# Patient Record
Sex: Male | Born: 1955 | Race: White | Hispanic: No | Marital: Married | Smoking: Current every day smoker
Health system: Southern US, Community
[De-identification: ages and names within clinical notes are randomized; demographics above are authoritative.]

## PROBLEM LIST (undated history)

## (undated) DIAGNOSIS — C689 Malignant neoplasm of urinary organ, unspecified: Secondary | ICD-10-CM

## (undated) DIAGNOSIS — D500 Iron deficiency anemia secondary to blood loss (chronic): Secondary | ICD-10-CM

## (undated) DIAGNOSIS — L409 Psoriasis, unspecified: Secondary | ICD-10-CM

## (undated) DIAGNOSIS — C679 Malignant neoplasm of bladder, unspecified: Secondary | ICD-10-CM

## (undated) DIAGNOSIS — D509 Iron deficiency anemia, unspecified: Secondary | ICD-10-CM

## (undated) DIAGNOSIS — D6959 Other secondary thrombocytopenia: Principal | ICD-10-CM

## (undated) DIAGNOSIS — G629 Polyneuropathy, unspecified: Secondary | ICD-10-CM

## (undated) DIAGNOSIS — E538 Deficiency of other specified B group vitamins: Secondary | ICD-10-CM

## (undated) DIAGNOSIS — I10 Essential (primary) hypertension: Secondary | ICD-10-CM

## (undated) DIAGNOSIS — M545 Low back pain, unspecified: Secondary | ICD-10-CM

## (undated) DIAGNOSIS — Z9889 Other specified postprocedural states: Secondary | ICD-10-CM

## (undated) DIAGNOSIS — K2211 Ulcer of esophagus with bleeding: Secondary | ICD-10-CM

## (undated) DIAGNOSIS — F329 Major depressive disorder, single episode, unspecified: Secondary | ICD-10-CM

## (undated) DIAGNOSIS — F1011 Alcohol abuse, in remission: Secondary | ICD-10-CM

## (undated) DIAGNOSIS — K439 Ventral hernia without obstruction or gangrene: Secondary | ICD-10-CM

## (undated) DIAGNOSIS — E785 Hyperlipidemia, unspecified: Secondary | ICD-10-CM

## (undated) DIAGNOSIS — N183 Chronic kidney disease, stage 3 (moderate): Secondary | ICD-10-CM

## (undated) DIAGNOSIS — G4730 Sleep apnea, unspecified: Secondary | ICD-10-CM

## (undated) DIAGNOSIS — A498 Other bacterial infections of unspecified site: Secondary | ICD-10-CM

## (undated) DIAGNOSIS — I8501 Esophageal varices with bleeding: Secondary | ICD-10-CM

## (undated) DIAGNOSIS — L0390 Cellulitis, unspecified: Secondary | ICD-10-CM

## (undated) DIAGNOSIS — K219 Gastro-esophageal reflux disease without esophagitis: Secondary | ICD-10-CM

## (undated) DIAGNOSIS — I5032 Chronic diastolic (congestive) heart failure: Secondary | ICD-10-CM

## (undated) DIAGNOSIS — A048 Other specified bacterial intestinal infections: Secondary | ICD-10-CM

## (undated) DIAGNOSIS — D649 Anemia, unspecified: Secondary | ICD-10-CM

## (undated) DIAGNOSIS — C801 Malignant (primary) neoplasm, unspecified: Secondary | ICD-10-CM

## (undated) DIAGNOSIS — K7460 Unspecified cirrhosis of liver: Secondary | ICD-10-CM

## (undated) DIAGNOSIS — F32A Depression, unspecified: Secondary | ICD-10-CM

## (undated) DIAGNOSIS — K56609 Unspecified intestinal obstruction, unspecified as to partial versus complete obstruction: Secondary | ICD-10-CM

## (undated) DIAGNOSIS — A4902 Methicillin resistant Staphylococcus aureus infection, unspecified site: Secondary | ICD-10-CM

## (undated) DIAGNOSIS — E119 Type 2 diabetes mellitus without complications: Secondary | ICD-10-CM

## (undated) DIAGNOSIS — G2581 Restless legs syndrome: Secondary | ICD-10-CM

## (undated) DIAGNOSIS — E1110 Type 2 diabetes mellitus with ketoacidosis without coma: Secondary | ICD-10-CM

## (undated) HISTORY — DX: Low back pain, unspecified: M54.50

## (undated) HISTORY — DX: Esophageal varices with bleeding: I85.01

## (undated) HISTORY — DX: Gastro-esophageal reflux disease without esophagitis: K21.9

## (undated) HISTORY — DX: Iron deficiency anemia secondary to blood loss (chronic): D50.0

## (undated) HISTORY — DX: Iron deficiency anemia, unspecified: D50.9

## (undated) HISTORY — DX: Cellulitis, unspecified: L03.90

## (undated) HISTORY — DX: Ventral hernia without obstruction or gangrene: K43.9

## (undated) HISTORY — PX: CATARACT EXTRACTION, BILATERAL: SHX1313

## (undated) HISTORY — PX: BLADDER SURGERY: SHX569

## (undated) HISTORY — PX: OTHER SURGICAL HISTORY: SHX169

## (undated) HISTORY — DX: Essential (primary) hypertension: I10

## (undated) HISTORY — DX: Other secondary thrombocytopenia: D69.59

## (undated) HISTORY — DX: Unspecified cirrhosis of liver: K74.60

## (undated) HISTORY — DX: Restless legs syndrome: G25.81

## (undated) HISTORY — DX: Type 2 diabetes mellitus without complications: E11.9

## (undated) HISTORY — DX: Hyperlipidemia, unspecified: E78.5

## (undated) HISTORY — DX: Malignant neoplasm of urinary organ, unspecified: C68.9

## (undated) HISTORY — DX: Sleep apnea, unspecified: G47.30

## (undated) HISTORY — DX: Anemia, unspecified: D64.9

## (undated) HISTORY — DX: Unspecified intestinal obstruction, unspecified as to partial versus complete obstruction: K56.609

## (undated) HISTORY — PX: EYE SURGERY: SHX253

## (undated) HISTORY — DX: Other bacterial infections of unspecified site: A49.8

## (undated) HISTORY — PX: HERNIA REPAIR: SHX51

## (undated) HISTORY — DX: Depression, unspecified: F32.A

## (undated) HISTORY — DX: Deficiency of other specified B group vitamins: E53.8

## (undated) HISTORY — DX: Alcohol abuse, in remission: F10.11

## (undated) HISTORY — DX: Psoriasis, unspecified: L40.9

## (undated) HISTORY — DX: Polyneuropathy, unspecified: G62.9

## (undated) HISTORY — DX: Other specified postprocedural states: Z98.890

## (undated) HISTORY — DX: Malignant neoplasm of bladder, unspecified: C67.9

## (undated) HISTORY — DX: Major depressive disorder, single episode, unspecified: F32.9

## (undated) HISTORY — PX: SHOULDER SURGERY: SHX246

## (undated) HISTORY — DX: Low back pain: M54.5

---

## 2001-09-01 ENCOUNTER — Ambulatory Visit (HOSPITAL_COMMUNITY): Admission: RE | Admit: 2001-09-01 | Discharge: 2001-09-01 | Payer: Self-pay | Admitting: Family Medicine

## 2001-09-01 ENCOUNTER — Encounter: Payer: Self-pay | Admitting: Family Medicine

## 2001-09-16 ENCOUNTER — Encounter: Payer: Self-pay | Admitting: Orthopaedic Surgery

## 2001-09-16 ENCOUNTER — Ambulatory Visit (HOSPITAL_COMMUNITY): Admission: RE | Admit: 2001-09-16 | Discharge: 2001-09-16 | Payer: Self-pay | Admitting: Orthopaedic Surgery

## 2001-09-27 ENCOUNTER — Encounter (HOSPITAL_COMMUNITY): Admission: RE | Admit: 2001-09-27 | Discharge: 2001-10-27 | Payer: Self-pay | Admitting: Orthopaedic Surgery

## 2002-11-12 ENCOUNTER — Emergency Department (HOSPITAL_COMMUNITY): Admission: EM | Admit: 2002-11-12 | Discharge: 2002-11-12 | Payer: Self-pay | Admitting: Emergency Medicine

## 2002-11-12 ENCOUNTER — Encounter: Payer: Self-pay | Admitting: Emergency Medicine

## 2002-12-27 ENCOUNTER — Emergency Department (HOSPITAL_COMMUNITY): Admission: EM | Admit: 2002-12-27 | Discharge: 2002-12-27 | Payer: Self-pay | Admitting: Emergency Medicine

## 2002-12-27 ENCOUNTER — Encounter: Payer: Self-pay | Admitting: Emergency Medicine

## 2005-05-22 ENCOUNTER — Other Ambulatory Visit: Admission: RE | Admit: 2005-05-22 | Discharge: 2005-05-22 | Payer: Self-pay | Admitting: Dermatology

## 2007-07-05 ENCOUNTER — Emergency Department (HOSPITAL_COMMUNITY): Admission: EM | Admit: 2007-07-05 | Discharge: 2007-07-05 | Payer: Self-pay | Admitting: Emergency Medicine

## 2007-08-25 HISTORY — PX: OTHER SURGICAL HISTORY: SHX169

## 2007-09-06 ENCOUNTER — Ambulatory Visit: Payer: Self-pay | Admitting: Urgent Care

## 2007-09-21 ENCOUNTER — Ambulatory Visit (HOSPITAL_COMMUNITY): Admission: RE | Admit: 2007-09-21 | Discharge: 2007-09-21 | Payer: Self-pay | Admitting: Internal Medicine

## 2007-09-21 ENCOUNTER — Ambulatory Visit: Payer: Self-pay | Admitting: Internal Medicine

## 2007-09-21 ENCOUNTER — Encounter: Payer: Self-pay | Admitting: Internal Medicine

## 2008-02-03 ENCOUNTER — Emergency Department (HOSPITAL_COMMUNITY): Admission: EM | Admit: 2008-02-03 | Discharge: 2008-02-04 | Payer: Self-pay | Admitting: Emergency Medicine

## 2008-07-20 ENCOUNTER — Ambulatory Visit (HOSPITAL_COMMUNITY): Admission: RE | Admit: 2008-07-20 | Discharge: 2008-07-20 | Payer: Self-pay | Admitting: Family Medicine

## 2008-08-24 ENCOUNTER — Ambulatory Visit (HOSPITAL_COMMUNITY): Admission: RE | Admit: 2008-08-24 | Discharge: 2008-08-24 | Payer: Self-pay | Admitting: Family Medicine

## 2008-08-24 ENCOUNTER — Encounter (INDEPENDENT_AMBULATORY_CARE_PROVIDER_SITE_OTHER): Payer: Self-pay | Admitting: Internal Medicine

## 2008-10-03 ENCOUNTER — Encounter (INDEPENDENT_AMBULATORY_CARE_PROVIDER_SITE_OTHER): Payer: Self-pay | Admitting: Internal Medicine

## 2008-10-04 ENCOUNTER — Ambulatory Visit: Payer: Self-pay | Admitting: Internal Medicine

## 2008-10-04 DIAGNOSIS — F329 Major depressive disorder, single episode, unspecified: Secondary | ICD-10-CM

## 2008-10-04 DIAGNOSIS — I10 Essential (primary) hypertension: Secondary | ICD-10-CM

## 2008-10-04 DIAGNOSIS — R5381 Other malaise: Secondary | ICD-10-CM | POA: Insufficient documentation

## 2008-10-04 DIAGNOSIS — R635 Abnormal weight gain: Secondary | ICD-10-CM

## 2008-10-04 DIAGNOSIS — E785 Hyperlipidemia, unspecified: Secondary | ICD-10-CM

## 2008-10-04 DIAGNOSIS — R9431 Abnormal electrocardiogram [ECG] [EKG]: Secondary | ICD-10-CM

## 2008-10-04 DIAGNOSIS — R5383 Other fatigue: Secondary | ICD-10-CM

## 2008-10-04 LAB — CONVERTED CEMR LAB: Blood Glucose, Fingerstick: 241

## 2008-10-05 LAB — CONVERTED CEMR LAB
Alkaline Phosphatase: 55 units/L (ref 39–117)
Basophils Absolute: 0 10*3/uL (ref 0.0–0.1)
CO2: 23 meq/L (ref 19–32)
Cholesterol: 171 mg/dL (ref 0–200)
Creatinine, Ser: 0.98 mg/dL (ref 0.40–1.50)
Eosinophils Absolute: 0.2 10*3/uL (ref 0.0–0.7)
Eosinophils Relative: 2 % (ref 0–5)
Glucose, Bld: 225 mg/dL — ABNORMAL HIGH (ref 70–99)
HCT: 40.4 % (ref 39.0–52.0)
Hemoglobin: 12 g/dL — ABNORMAL LOW (ref 13.0–17.0)
LDL Cholesterol: 79 mg/dL (ref 0–99)
Lymphocytes Relative: 30 % (ref 12–46)
Lymphs Abs: 2.4 10*3/uL (ref 0.7–4.0)
MCV: 84.2 fL (ref 78.0–100.0)
Monocytes Absolute: 0.7 10*3/uL (ref 0.1–1.0)
Platelets: 184 10*3/uL (ref 150–400)
RDW: 15.2 % (ref 11.5–15.5)
Total Bilirubin: 0.6 mg/dL (ref 0.3–1.2)
Total CHOL/HDL Ratio: 4.5
Triglycerides: 271 mg/dL — ABNORMAL HIGH (ref <150)
VLDL: 54 mg/dL — ABNORMAL HIGH (ref 0–40)

## 2008-10-06 ENCOUNTER — Encounter (INDEPENDENT_AMBULATORY_CARE_PROVIDER_SITE_OTHER): Payer: Self-pay | Admitting: Internal Medicine

## 2008-10-06 DIAGNOSIS — R809 Proteinuria, unspecified: Secondary | ICD-10-CM | POA: Insufficient documentation

## 2008-10-08 ENCOUNTER — Ambulatory Visit: Admission: RE | Admit: 2008-10-08 | Discharge: 2008-10-08 | Payer: Self-pay | Admitting: Internal Medicine

## 2008-10-08 ENCOUNTER — Encounter (INDEPENDENT_AMBULATORY_CARE_PROVIDER_SITE_OTHER): Payer: Self-pay | Admitting: Internal Medicine

## 2008-10-13 ENCOUNTER — Encounter (INDEPENDENT_AMBULATORY_CARE_PROVIDER_SITE_OTHER): Payer: Self-pay | Admitting: Internal Medicine

## 2008-10-13 ENCOUNTER — Ambulatory Visit: Payer: Self-pay | Admitting: Cardiology

## 2008-10-16 ENCOUNTER — Ambulatory Visit (HOSPITAL_COMMUNITY): Admission: RE | Admit: 2008-10-16 | Discharge: 2008-10-16 | Payer: Self-pay | Admitting: Cardiology

## 2008-10-16 ENCOUNTER — Ambulatory Visit: Payer: Self-pay | Admitting: Cardiology

## 2008-10-16 ENCOUNTER — Encounter: Payer: Self-pay | Admitting: Cardiology

## 2008-10-17 ENCOUNTER — Ambulatory Visit: Payer: Self-pay | Admitting: Pulmonary Disease

## 2008-10-25 ENCOUNTER — Ambulatory Visit: Payer: Self-pay | Admitting: Internal Medicine

## 2008-10-25 DIAGNOSIS — G4733 Obstructive sleep apnea (adult) (pediatric): Secondary | ICD-10-CM | POA: Insufficient documentation

## 2008-10-26 ENCOUNTER — Telehealth (INDEPENDENT_AMBULATORY_CARE_PROVIDER_SITE_OTHER): Payer: Self-pay | Admitting: Internal Medicine

## 2008-11-02 ENCOUNTER — Ambulatory Visit: Admission: RE | Admit: 2008-11-02 | Discharge: 2008-11-02 | Payer: Self-pay | Admitting: Internal Medicine

## 2008-11-02 ENCOUNTER — Encounter (INDEPENDENT_AMBULATORY_CARE_PROVIDER_SITE_OTHER): Payer: Self-pay | Admitting: Internal Medicine

## 2008-11-03 ENCOUNTER — Encounter (INDEPENDENT_AMBULATORY_CARE_PROVIDER_SITE_OTHER): Payer: Self-pay | Admitting: Internal Medicine

## 2008-11-07 ENCOUNTER — Ambulatory Visit: Payer: Self-pay | Admitting: Pulmonary Disease

## 2008-11-08 ENCOUNTER — Ambulatory Visit: Payer: Self-pay | Admitting: Internal Medicine

## 2008-11-09 ENCOUNTER — Encounter (INDEPENDENT_AMBULATORY_CARE_PROVIDER_SITE_OTHER): Payer: Self-pay | Admitting: Internal Medicine

## 2008-11-09 ENCOUNTER — Ambulatory Visit: Payer: Self-pay | Admitting: Cardiology

## 2008-11-14 ENCOUNTER — Encounter (INDEPENDENT_AMBULATORY_CARE_PROVIDER_SITE_OTHER): Payer: Self-pay | Admitting: Internal Medicine

## 2008-11-22 ENCOUNTER — Ambulatory Visit: Payer: Self-pay | Admitting: Cardiology

## 2008-11-23 ENCOUNTER — Encounter (INDEPENDENT_AMBULATORY_CARE_PROVIDER_SITE_OTHER): Payer: Self-pay | Admitting: Internal Medicine

## 2008-11-27 ENCOUNTER — Encounter (INDEPENDENT_AMBULATORY_CARE_PROVIDER_SITE_OTHER): Payer: Self-pay | Admitting: Internal Medicine

## 2008-11-28 ENCOUNTER — Encounter (INDEPENDENT_AMBULATORY_CARE_PROVIDER_SITE_OTHER): Payer: Self-pay | Admitting: Internal Medicine

## 2008-11-29 ENCOUNTER — Ambulatory Visit: Payer: Self-pay | Admitting: Internal Medicine

## 2008-11-30 ENCOUNTER — Encounter (INDEPENDENT_AMBULATORY_CARE_PROVIDER_SITE_OTHER): Payer: Self-pay | Admitting: Internal Medicine

## 2008-12-04 ENCOUNTER — Encounter (INDEPENDENT_AMBULATORY_CARE_PROVIDER_SITE_OTHER): Payer: Self-pay | Admitting: Internal Medicine

## 2008-12-06 ENCOUNTER — Encounter (INDEPENDENT_AMBULATORY_CARE_PROVIDER_SITE_OTHER): Payer: Self-pay | Admitting: Internal Medicine

## 2008-12-13 ENCOUNTER — Ambulatory Visit: Payer: Self-pay | Admitting: Internal Medicine

## 2008-12-13 DIAGNOSIS — G2581 Restless legs syndrome: Secondary | ICD-10-CM | POA: Insufficient documentation

## 2008-12-14 ENCOUNTER — Telehealth (INDEPENDENT_AMBULATORY_CARE_PROVIDER_SITE_OTHER): Payer: Self-pay | Admitting: Internal Medicine

## 2008-12-21 ENCOUNTER — Ambulatory Visit (HOSPITAL_COMMUNITY): Admission: RE | Admit: 2008-12-21 | Discharge: 2008-12-21 | Payer: Self-pay | Admitting: Internal Medicine

## 2008-12-26 ENCOUNTER — Encounter (INDEPENDENT_AMBULATORY_CARE_PROVIDER_SITE_OTHER): Payer: Self-pay | Admitting: Internal Medicine

## 2008-12-27 ENCOUNTER — Ambulatory Visit: Payer: Self-pay | Admitting: Internal Medicine

## 2008-12-27 DIAGNOSIS — G4700 Insomnia, unspecified: Secondary | ICD-10-CM | POA: Insufficient documentation

## 2008-12-27 DIAGNOSIS — E278 Other specified disorders of adrenal gland: Secondary | ICD-10-CM | POA: Insufficient documentation

## 2009-01-01 ENCOUNTER — Ambulatory Visit (HOSPITAL_COMMUNITY): Admission: RE | Admit: 2009-01-01 | Discharge: 2009-01-01 | Payer: Self-pay | Admitting: Internal Medicine

## 2009-01-03 ENCOUNTER — Ambulatory Visit: Payer: Self-pay | Admitting: Internal Medicine

## 2009-01-08 ENCOUNTER — Ambulatory Visit: Payer: Self-pay | Admitting: Cardiology

## 2009-01-12 ENCOUNTER — Encounter (INDEPENDENT_AMBULATORY_CARE_PROVIDER_SITE_OTHER): Payer: Self-pay | Admitting: Internal Medicine

## 2009-01-22 ENCOUNTER — Encounter (INDEPENDENT_AMBULATORY_CARE_PROVIDER_SITE_OTHER): Payer: Self-pay | Admitting: Urology

## 2009-01-22 ENCOUNTER — Ambulatory Visit (HOSPITAL_COMMUNITY): Admission: RE | Admit: 2009-01-22 | Discharge: 2009-01-23 | Payer: Self-pay | Admitting: Urology

## 2009-02-07 ENCOUNTER — Emergency Department (HOSPITAL_COMMUNITY): Admission: EM | Admit: 2009-02-07 | Discharge: 2009-02-07 | Payer: Self-pay | Admitting: Emergency Medicine

## 2009-02-07 ENCOUNTER — Telehealth (INDEPENDENT_AMBULATORY_CARE_PROVIDER_SITE_OTHER): Payer: Self-pay | Admitting: Internal Medicine

## 2009-02-14 ENCOUNTER — Ambulatory Visit: Payer: Self-pay | Admitting: Internal Medicine

## 2009-02-20 ENCOUNTER — Encounter (INDEPENDENT_AMBULATORY_CARE_PROVIDER_SITE_OTHER): Payer: Self-pay | Admitting: Internal Medicine

## 2009-02-28 ENCOUNTER — Ambulatory Visit: Payer: Self-pay | Admitting: Oncology

## 2009-02-28 ENCOUNTER — Inpatient Hospital Stay (HOSPITAL_COMMUNITY): Admission: AD | Admit: 2009-02-28 | Discharge: 2009-03-02 | Payer: Self-pay

## 2009-02-28 ENCOUNTER — Ambulatory Visit: Payer: Self-pay | Admitting: Cardiology

## 2009-02-28 ENCOUNTER — Ambulatory Visit: Payer: Self-pay | Admitting: Internal Medicine

## 2009-02-28 DIAGNOSIS — R0789 Other chest pain: Secondary | ICD-10-CM

## 2009-03-02 ENCOUNTER — Encounter: Payer: Self-pay | Admitting: Cardiology

## 2009-03-02 ENCOUNTER — Ambulatory Visit: Payer: Self-pay | Admitting: Gastroenterology

## 2009-03-08 ENCOUNTER — Encounter (HOSPITAL_COMMUNITY): Admission: RE | Admit: 2009-03-08 | Discharge: 2009-04-07 | Payer: Self-pay | Admitting: Oncology

## 2009-03-08 ENCOUNTER — Ambulatory Visit (HOSPITAL_COMMUNITY): Payer: Self-pay | Admitting: Oncology

## 2009-03-09 ENCOUNTER — Ambulatory Visit: Payer: Self-pay | Admitting: Internal Medicine

## 2009-03-09 DIAGNOSIS — D518 Other vitamin B12 deficiency anemias: Secondary | ICD-10-CM

## 2009-03-09 DIAGNOSIS — D509 Iron deficiency anemia, unspecified: Secondary | ICD-10-CM

## 2009-03-09 DIAGNOSIS — D500 Iron deficiency anemia secondary to blood loss (chronic): Secondary | ICD-10-CM

## 2009-03-09 HISTORY — DX: Iron deficiency anemia secondary to blood loss (chronic): D50.0

## 2009-03-09 HISTORY — DX: Iron deficiency anemia, unspecified: D50.9

## 2009-03-09 LAB — CONVERTED CEMR LAB
Glucose, Urine, Semiquant: NEGATIVE
Specific Gravity, Urine: 1.02
pH: 5.5

## 2009-03-10 ENCOUNTER — Encounter (INDEPENDENT_AMBULATORY_CARE_PROVIDER_SITE_OTHER): Payer: Self-pay | Admitting: Internal Medicine

## 2009-03-12 ENCOUNTER — Encounter (INDEPENDENT_AMBULATORY_CARE_PROVIDER_SITE_OTHER): Payer: Self-pay | Admitting: *Deleted

## 2009-03-12 ENCOUNTER — Encounter: Payer: Self-pay | Admitting: Gastroenterology

## 2009-03-21 ENCOUNTER — Telehealth (INDEPENDENT_AMBULATORY_CARE_PROVIDER_SITE_OTHER): Payer: Self-pay | Admitting: Internal Medicine

## 2009-03-21 ENCOUNTER — Encounter (INDEPENDENT_AMBULATORY_CARE_PROVIDER_SITE_OTHER): Payer: Self-pay | Admitting: Internal Medicine

## 2009-03-24 HISTORY — PX: ESOPHAGOGASTRODUODENOSCOPY: SHX1529

## 2009-03-24 HISTORY — PX: OTHER SURGICAL HISTORY: SHX169

## 2009-03-28 ENCOUNTER — Ambulatory Visit: Payer: Self-pay | Admitting: Family Medicine

## 2009-04-04 DIAGNOSIS — F17200 Nicotine dependence, unspecified, uncomplicated: Secondary | ICD-10-CM | POA: Insufficient documentation

## 2009-04-04 DIAGNOSIS — M129 Arthropathy, unspecified: Secondary | ICD-10-CM | POA: Insufficient documentation

## 2009-04-04 DIAGNOSIS — K219 Gastro-esophageal reflux disease without esophagitis: Secondary | ICD-10-CM

## 2009-04-05 ENCOUNTER — Ambulatory Visit: Payer: Self-pay | Admitting: Internal Medicine

## 2009-04-06 ENCOUNTER — Encounter: Payer: Self-pay | Admitting: Internal Medicine

## 2009-04-09 ENCOUNTER — Ambulatory Visit: Payer: Self-pay | Admitting: Physician Assistant

## 2009-04-11 ENCOUNTER — Encounter (HOSPITAL_COMMUNITY): Admission: RE | Admit: 2009-04-11 | Discharge: 2009-05-11 | Payer: Self-pay | Admitting: Oncology

## 2009-04-12 ENCOUNTER — Ambulatory Visit: Payer: Self-pay | Admitting: Internal Medicine

## 2009-04-12 ENCOUNTER — Ambulatory Visit (HOSPITAL_COMMUNITY): Admission: RE | Admit: 2009-04-12 | Discharge: 2009-04-12 | Payer: Self-pay | Admitting: Internal Medicine

## 2009-04-18 ENCOUNTER — Encounter (INDEPENDENT_AMBULATORY_CARE_PROVIDER_SITE_OTHER): Payer: Self-pay | Admitting: Internal Medicine

## 2009-04-19 ENCOUNTER — Ambulatory Visit: Payer: Self-pay | Admitting: Internal Medicine

## 2009-04-19 DIAGNOSIS — M25519 Pain in unspecified shoulder: Secondary | ICD-10-CM

## 2009-04-20 ENCOUNTER — Telehealth (INDEPENDENT_AMBULATORY_CARE_PROVIDER_SITE_OTHER): Payer: Self-pay | Admitting: *Deleted

## 2009-04-20 ENCOUNTER — Encounter: Payer: Self-pay | Admitting: Internal Medicine

## 2009-04-24 ENCOUNTER — Ambulatory Visit (HOSPITAL_COMMUNITY): Payer: Self-pay | Admitting: Oncology

## 2009-04-24 ENCOUNTER — Encounter (INDEPENDENT_AMBULATORY_CARE_PROVIDER_SITE_OTHER): Payer: Self-pay | Admitting: *Deleted

## 2009-04-24 ENCOUNTER — Telehealth (INDEPENDENT_AMBULATORY_CARE_PROVIDER_SITE_OTHER): Payer: Self-pay

## 2009-04-24 ENCOUNTER — Encounter: Payer: Self-pay | Admitting: Gastroenterology

## 2009-04-24 HISTORY — PX: COLONOSCOPY: SHX174

## 2009-04-26 ENCOUNTER — Ambulatory Visit: Payer: Self-pay | Admitting: Internal Medicine

## 2009-04-26 ENCOUNTER — Ambulatory Visit (HOSPITAL_COMMUNITY): Admission: RE | Admit: 2009-04-26 | Discharge: 2009-04-26 | Payer: Self-pay | Admitting: Internal Medicine

## 2009-04-27 ENCOUNTER — Encounter: Payer: Self-pay | Admitting: Internal Medicine

## 2009-04-27 ENCOUNTER — Encounter (INDEPENDENT_AMBULATORY_CARE_PROVIDER_SITE_OTHER): Payer: Self-pay | Admitting: Internal Medicine

## 2009-04-27 ENCOUNTER — Encounter (INDEPENDENT_AMBULATORY_CARE_PROVIDER_SITE_OTHER): Payer: Self-pay | Admitting: *Deleted

## 2009-04-30 ENCOUNTER — Telehealth (INDEPENDENT_AMBULATORY_CARE_PROVIDER_SITE_OTHER): Payer: Self-pay

## 2009-04-30 ENCOUNTER — Telehealth (INDEPENDENT_AMBULATORY_CARE_PROVIDER_SITE_OTHER): Payer: Self-pay | Admitting: *Deleted

## 2009-05-01 ENCOUNTER — Encounter (INDEPENDENT_AMBULATORY_CARE_PROVIDER_SITE_OTHER): Payer: Self-pay | Admitting: Internal Medicine

## 2009-05-02 ENCOUNTER — Encounter: Payer: Self-pay | Admitting: Internal Medicine

## 2009-05-04 ENCOUNTER — Encounter (INDEPENDENT_AMBULATORY_CARE_PROVIDER_SITE_OTHER): Payer: Self-pay | Admitting: Interventional Radiology

## 2009-05-04 ENCOUNTER — Encounter: Payer: Self-pay | Admitting: Internal Medicine

## 2009-05-04 ENCOUNTER — Ambulatory Visit (HOSPITAL_COMMUNITY): Admission: RE | Admit: 2009-05-04 | Discharge: 2009-05-04 | Payer: Self-pay | Admitting: Ophthalmology

## 2009-05-07 ENCOUNTER — Ambulatory Visit: Payer: Self-pay | Admitting: Gastroenterology

## 2009-05-07 ENCOUNTER — Inpatient Hospital Stay (HOSPITAL_COMMUNITY): Admission: EM | Admit: 2009-05-07 | Discharge: 2009-05-09 | Payer: Self-pay | Admitting: Emergency Medicine

## 2009-05-07 ENCOUNTER — Telehealth (INDEPENDENT_AMBULATORY_CARE_PROVIDER_SITE_OTHER): Payer: Self-pay

## 2009-05-09 ENCOUNTER — Ambulatory Visit: Payer: Self-pay | Admitting: Gastroenterology

## 2009-05-09 ENCOUNTER — Telehealth (INDEPENDENT_AMBULATORY_CARE_PROVIDER_SITE_OTHER): Payer: Self-pay | Admitting: Internal Medicine

## 2009-05-09 ENCOUNTER — Encounter: Payer: Self-pay | Admitting: Gastroenterology

## 2009-05-10 ENCOUNTER — Encounter: Payer: Self-pay | Admitting: Internal Medicine

## 2009-05-10 ENCOUNTER — Encounter (INDEPENDENT_AMBULATORY_CARE_PROVIDER_SITE_OTHER): Payer: Self-pay

## 2009-05-15 ENCOUNTER — Encounter (HOSPITAL_COMMUNITY): Admission: RE | Admit: 2009-05-15 | Discharge: 2009-06-14 | Payer: Self-pay | Admitting: Oncology

## 2009-05-17 ENCOUNTER — Encounter (INDEPENDENT_AMBULATORY_CARE_PROVIDER_SITE_OTHER): Payer: Self-pay | Admitting: Internal Medicine

## 2009-05-21 ENCOUNTER — Encounter: Payer: Self-pay | Admitting: Internal Medicine

## 2009-05-21 ENCOUNTER — Ambulatory Visit: Payer: Self-pay | Admitting: Internal Medicine

## 2009-05-21 DIAGNOSIS — K7460 Unspecified cirrhosis of liver: Secondary | ICD-10-CM | POA: Insufficient documentation

## 2009-05-21 DIAGNOSIS — C679 Malignant neoplasm of bladder, unspecified: Secondary | ICD-10-CM

## 2009-05-24 HISTORY — PX: OTHER SURGICAL HISTORY: SHX169

## 2009-05-29 ENCOUNTER — Ambulatory Visit: Payer: Self-pay | Admitting: Internal Medicine

## 2009-05-30 ENCOUNTER — Encounter: Payer: Self-pay | Admitting: Internal Medicine

## 2009-06-04 ENCOUNTER — Inpatient Hospital Stay (HOSPITAL_COMMUNITY): Admission: RE | Admit: 2009-06-04 | Discharge: 2009-06-06 | Payer: Self-pay | Admitting: Urology

## 2009-06-04 ENCOUNTER — Encounter (INDEPENDENT_AMBULATORY_CARE_PROVIDER_SITE_OTHER): Payer: Self-pay | Admitting: Urology

## 2009-06-07 ENCOUNTER — Encounter: Payer: Self-pay | Admitting: Gastroenterology

## 2009-06-19 ENCOUNTER — Ambulatory Visit: Payer: Self-pay | Admitting: Internal Medicine

## 2009-06-25 ENCOUNTER — Telehealth (INDEPENDENT_AMBULATORY_CARE_PROVIDER_SITE_OTHER): Payer: Self-pay | Admitting: *Deleted

## 2009-06-29 ENCOUNTER — Encounter (INDEPENDENT_AMBULATORY_CARE_PROVIDER_SITE_OTHER): Payer: Self-pay | Admitting: Internal Medicine

## 2009-07-01 ENCOUNTER — Encounter (INDEPENDENT_AMBULATORY_CARE_PROVIDER_SITE_OTHER): Payer: Self-pay | Admitting: Internal Medicine

## 2009-07-04 ENCOUNTER — Ambulatory Visit: Payer: Self-pay | Admitting: Internal Medicine

## 2009-07-05 ENCOUNTER — Telehealth: Payer: Self-pay | Admitting: Internal Medicine

## 2009-07-06 ENCOUNTER — Encounter: Payer: Self-pay | Admitting: Internal Medicine

## 2009-07-09 ENCOUNTER — Encounter: Payer: Self-pay | Admitting: Internal Medicine

## 2009-07-11 LAB — CONVERTED CEMR LAB: Tissue Transglutaminase Ab, IgA: 1.6 units (ref <7)

## 2009-07-12 ENCOUNTER — Encounter (INDEPENDENT_AMBULATORY_CARE_PROVIDER_SITE_OTHER): Payer: Self-pay | Admitting: Internal Medicine

## 2009-07-23 ENCOUNTER — Ambulatory Visit: Payer: Self-pay | Admitting: Internal Medicine

## 2009-07-23 DIAGNOSIS — E118 Type 2 diabetes mellitus with unspecified complications: Secondary | ICD-10-CM | POA: Insufficient documentation

## 2009-07-24 ENCOUNTER — Encounter (INDEPENDENT_AMBULATORY_CARE_PROVIDER_SITE_OTHER): Payer: Self-pay | Admitting: Internal Medicine

## 2009-07-25 ENCOUNTER — Encounter (INDEPENDENT_AMBULATORY_CARE_PROVIDER_SITE_OTHER): Payer: Self-pay | Admitting: Internal Medicine

## 2009-07-26 ENCOUNTER — Encounter (INDEPENDENT_AMBULATORY_CARE_PROVIDER_SITE_OTHER): Payer: Self-pay | Admitting: Internal Medicine

## 2009-07-27 ENCOUNTER — Encounter (HOSPITAL_COMMUNITY): Admission: RE | Admit: 2009-07-27 | Discharge: 2009-08-22 | Payer: Self-pay | Admitting: Oncology

## 2009-07-31 ENCOUNTER — Encounter (INDEPENDENT_AMBULATORY_CARE_PROVIDER_SITE_OTHER): Payer: Self-pay | Admitting: Internal Medicine

## 2009-08-01 ENCOUNTER — Telehealth (INDEPENDENT_AMBULATORY_CARE_PROVIDER_SITE_OTHER): Payer: Self-pay | Admitting: Internal Medicine

## 2009-08-02 ENCOUNTER — Ambulatory Visit (HOSPITAL_COMMUNITY): Payer: Self-pay | Admitting: Oncology

## 2009-10-09 ENCOUNTER — Ambulatory Visit (HOSPITAL_COMMUNITY): Payer: Self-pay | Admitting: Oncology

## 2009-10-09 ENCOUNTER — Encounter (HOSPITAL_COMMUNITY): Admission: RE | Admit: 2009-10-09 | Discharge: 2009-11-08 | Payer: Self-pay | Admitting: Oncology

## 2009-10-31 ENCOUNTER — Encounter: Payer: Self-pay | Admitting: Internal Medicine

## 2009-11-14 ENCOUNTER — Encounter (HOSPITAL_COMMUNITY): Admission: RE | Admit: 2009-11-14 | Discharge: 2009-11-21 | Payer: Self-pay | Admitting: Oncology

## 2010-01-02 ENCOUNTER — Encounter (INDEPENDENT_AMBULATORY_CARE_PROVIDER_SITE_OTHER): Payer: Self-pay

## 2010-01-02 ENCOUNTER — Ambulatory Visit: Payer: Self-pay | Admitting: Internal Medicine

## 2010-01-02 DIAGNOSIS — K7689 Other specified diseases of liver: Secondary | ICD-10-CM | POA: Insufficient documentation

## 2010-01-04 ENCOUNTER — Ambulatory Visit: Payer: Self-pay | Admitting: Internal Medicine

## 2010-01-08 ENCOUNTER — Encounter: Payer: Self-pay | Admitting: Internal Medicine

## 2010-01-08 LAB — CONVERTED CEMR LAB
Ferritin: 27 ng/mL (ref 22–322)
Iron: 43 ug/dL (ref 42–165)
Saturation Ratios: 13 % — ABNORMAL LOW (ref 20–55)
UIBC: 299 ug/dL

## 2010-01-21 ENCOUNTER — Encounter: Payer: Self-pay | Admitting: Internal Medicine

## 2010-01-21 LAB — CONVERTED CEMR LAB
Basophils Relative: 0 % (ref 0–1)
Eosinophils Absolute: 0.2 10*3/uL (ref 0.0–0.7)
MCHC: 32.5 g/dL (ref 30.0–36.0)
MCV: 91.6 fL (ref 78.0–100.0)
Neutrophils Relative %: 59 % (ref 43–77)
Platelets: 140 10*3/uL — ABNORMAL LOW (ref 150–400)
RDW: 14.3 % (ref 11.5–15.5)

## 2010-01-25 ENCOUNTER — Ambulatory Visit (HOSPITAL_COMMUNITY): Payer: Self-pay | Admitting: Oncology

## 2010-02-06 ENCOUNTER — Encounter: Payer: Self-pay | Admitting: Internal Medicine

## 2010-02-22 ENCOUNTER — Emergency Department (HOSPITAL_COMMUNITY): Admission: EM | Admit: 2010-02-22 | Discharge: 2010-02-22 | Payer: Self-pay | Admitting: Emergency Medicine

## 2010-03-06 ENCOUNTER — Encounter (HOSPITAL_COMMUNITY): Admission: RE | Admit: 2010-03-06 | Discharge: 2010-04-05 | Payer: Self-pay | Admitting: Oncology

## 2010-03-14 ENCOUNTER — Encounter (INDEPENDENT_AMBULATORY_CARE_PROVIDER_SITE_OTHER): Payer: Self-pay

## 2010-04-10 LAB — CONVERTED CEMR LAB
Basophils Absolute: 0 10*3/uL (ref 0.0–0.1)
Eosinophils Relative: 3 % (ref 0–5)
Lymphocytes Relative: 34 % (ref 12–46)
Neutro Abs: 2.9 10*3/uL (ref 1.7–7.7)
Platelets: 90 10*3/uL — ABNORMAL LOW (ref 150–400)
RDW: 15.3 % (ref 11.5–15.5)

## 2010-06-06 ENCOUNTER — Ambulatory Visit: Payer: Self-pay | Admitting: Internal Medicine

## 2010-06-10 ENCOUNTER — Encounter (HOSPITAL_COMMUNITY): Admission: RE | Admit: 2010-06-10 | Discharge: 2010-07-10 | Payer: Self-pay | Admitting: Orthopaedic Surgery

## 2010-06-11 ENCOUNTER — Ambulatory Visit (HOSPITAL_COMMUNITY): Admission: RE | Admit: 2010-06-11 | Discharge: 2010-06-11 | Payer: Self-pay | Admitting: Internal Medicine

## 2010-06-12 ENCOUNTER — Encounter: Payer: Self-pay | Admitting: Internal Medicine

## 2010-06-12 LAB — CONVERTED CEMR LAB
ALT: 39 units/L (ref 0–53)
Alkaline Phosphatase: 67 units/L (ref 39–117)
Bilirubin, Direct: 0.1 mg/dL (ref 0.0–0.3)
CO2: 23 meq/L (ref 19–32)
Calcium: 10.1 mg/dL (ref 8.4–10.5)
Chloride: 103 meq/L (ref 96–112)
Creatinine, Ser: 1.08 mg/dL (ref 0.40–1.50)
INR: 1.16 (ref <1.50)
Indirect Bilirubin: 0.2 mg/dL (ref 0.0–0.9)
Lymphs Abs: 2.2 10*3/uL (ref 0.7–4.0)
Monocytes Relative: 7 % (ref 3–12)
Neutro Abs: 2.3 10*3/uL (ref 1.7–7.7)
Neutrophils Relative %: 46 % (ref 43–77)
Platelets: 111 10*3/uL — ABNORMAL LOW (ref 150–400)
Prothrombin Time: 14.7 s (ref 11.6–15.2)
RBC: 4.34 M/uL (ref 4.22–5.81)
Sodium: 134 meq/L — ABNORMAL LOW (ref 135–145)
Total Protein: 7.9 g/dL (ref 6.0–8.3)
WBC: 4.9 10*3/uL (ref 4.0–10.5)

## 2010-06-14 ENCOUNTER — Encounter: Payer: Self-pay | Admitting: Internal Medicine

## 2010-06-17 ENCOUNTER — Ambulatory Visit (HOSPITAL_COMMUNITY): Admission: RE | Admit: 2010-06-17 | Discharge: 2010-06-17 | Payer: Self-pay | Admitting: Internal Medicine

## 2010-07-05 ENCOUNTER — Ambulatory Visit (HOSPITAL_COMMUNITY): Admission: RE | Admit: 2010-07-05 | Discharge: 2010-07-05 | Payer: Self-pay | Admitting: Urology

## 2010-07-12 ENCOUNTER — Encounter (HOSPITAL_COMMUNITY): Admission: RE | Admit: 2010-07-12 | Discharge: 2010-08-11 | Payer: Self-pay | Admitting: Orthopaedic Surgery

## 2010-08-12 ENCOUNTER — Encounter (HOSPITAL_COMMUNITY): Admission: RE | Admit: 2010-08-12 | Discharge: 2010-08-23 | Payer: Self-pay | Admitting: Orthopaedic Surgery

## 2010-08-26 ENCOUNTER — Encounter (HOSPITAL_COMMUNITY): Admission: RE | Admit: 2010-08-26 | Discharge: 2010-09-25 | Payer: Self-pay | Admitting: Orthopaedic Surgery

## 2010-12-18 ENCOUNTER — Encounter (HOSPITAL_COMMUNITY)
Admission: RE | Admit: 2010-12-18 | Discharge: 2010-12-24 | Payer: Self-pay | Source: Home / Self Care | Attending: Oncology | Admitting: Oncology

## 2010-12-18 ENCOUNTER — Ambulatory Visit (HOSPITAL_COMMUNITY)
Admission: RE | Admit: 2010-12-18 | Discharge: 2010-12-24 | Payer: Self-pay | Source: Home / Self Care | Attending: Oncology | Admitting: Oncology

## 2010-12-18 LAB — DIFFERENTIAL
Basophils Relative: 0 % (ref 0–1)
Lymphocytes Relative: 30 % (ref 12–46)
Monocytes Absolute: 0.7 10*3/uL (ref 0.1–1.0)
Monocytes Relative: 10 % (ref 3–12)

## 2010-12-18 LAB — CBC
HCT: 31.9 % — ABNORMAL LOW (ref 39.0–52.0)
Hemoglobin: 9.7 g/dL — ABNORMAL LOW (ref 13.0–17.0)
MCH: 24.4 pg — ABNORMAL LOW (ref 26.0–34.0)
MCHC: 30.4 g/dL (ref 30.0–36.0)
MCV: 80.4 fL (ref 78.0–100.0)
Platelets: 111 10*3/uL — ABNORMAL LOW (ref 150–400)
RDW: 15.5 % (ref 11.5–15.5)
WBC: 6.8 10*3/uL (ref 4.0–10.5)

## 2010-12-18 LAB — BASIC METABOLIC PANEL
Calcium: 8.9 mg/dL (ref 8.4–10.5)
GFR calc Af Amer: >60 mL/min (ref >60)
Sodium: 133 mEq/L — ABNORMAL LOW (ref 135–145)

## 2010-12-18 LAB — FOLATE: Folate: >20 ng/mL

## 2010-12-18 LAB — IRON AND TIBC
Iron: 23 ug/dL — ABNORMAL LOW (ref 42–135)
TIBC: 427 ug/dL (ref 215–435)
UIBC: 404 ug/dL

## 2010-12-18 LAB — RETICULOCYTES: RBC.: 3.97 MIL/uL — ABNORMAL LOW (ref 4.22–5.81)

## 2010-12-18 LAB — VITAMIN B12: Vitamin B-12: 319 pg/mL (ref 211–911)

## 2010-12-19 ENCOUNTER — Other Ambulatory Visit (HOSPITAL_COMMUNITY): Payer: Self-pay | Admitting: Oncology

## 2010-12-19 DIAGNOSIS — Z87891 Personal history of nicotine dependence: Secondary | ICD-10-CM

## 2010-12-22 LAB — CONVERTED CEMR LAB
Albumin: 3.3 g/dL — ABNORMAL LOW (ref 3.5–5.2)
Alkaline Phosphatase: 37 units/L — ABNORMAL LOW (ref 39–117)
BUN: 22 mg/dL (ref 6–23)
Basophils Absolute: 0 10*3/uL (ref 0.0–0.1)
Basophils Relative: 0 % (ref 0–1)
Creatinine, Ser: 1.38 mg/dL (ref 0.40–1.50)
Eosinophils Absolute: 0.2 10*3/uL (ref 0.0–0.7)
Eosinophils Relative: 3 % (ref 0–5)
Glucose, Bld: 78 mg/dL (ref 70–99)
HCT: 28 % — ABNORMAL LOW (ref 39.0–52.0)
Hemoglobin: 9.1 g/dL — ABNORMAL LOW (ref 13.0–17.0)
MCHC: 32.5 g/dL (ref 30.0–36.0)
MCV: 76.2 fL — ABNORMAL LOW (ref 78.0–100.0)
Monocytes Absolute: 0.5 10*3/uL (ref 0.1–1.0)
Potassium: 5.2 meq/L (ref 3.5–5.3)
Pro B Natriuretic peptide (BNP): 70.9 pg/mL (ref 0.0–100.0)
RDW: 18.5 % — ABNORMAL HIGH (ref 11.5–15.5)
Total Bilirubin: 0.3 mg/dL (ref 0.3–1.2)

## 2010-12-24 NOTE — Miscellaneous (Signed)
Summary: hepatic vein measurments from aph with path  Clinical Lists Changes SP Venogram/Hepatic W/Pressure - STATUS: Final  IMAGE                                     Perform Date: 11Jun10 09:50  Ordered By: Jena Gauss MD , Gerrit Friends           Ordered Date: 11Jun10 09:51  Facility: [facility]                              Department: SP  Service Report Text  Kyle [name] Accession Number: [id number]      Clinical Data: Fatty liver with liver enzymes abnormalities.    TRANSCATHETER BIOPSY,VENO HEPATIC W/ HEMODYNAMICS,ULTRASOUND VENOUS   ACCESS    Comparison: CT 02/07/2009    Technique: Patency of the right IJ vein was confirmed with   ultrasound with image documentation. An appropriate skin site was   determined. Skin site was marked, prepped with chlorhexidine, and   draped using maximum barrier technique. The region was infiltrated   locally with 1% lidocaine. Intravenous fentanyl and Versed were   administered as conscious sedation during continuous   cardiorespiratory monitoring by the radiology RN. Under real-time   ultrasound guidance, the right IJ vein was accessed with a 21 gauge   micropuncture needle; the needle tip within the vein was confirmed   with ultrasound image documentation.   This was exchanged over   guidewire for transitional dilator, which allowed passage of a J   wire into the IVC. Over this, a 5 [place] angiographic catheter was   advanced and used to selectively catheterize the right hepatic   vein. Confirmatory CO2 venography was performed. Wedged and hepatic   venous pressure measurements were obtained. The right hepatic vein   pressure was 21/17(19) mmHg.  Wedge pressure was 28 mmHg. The   catheter was exchanged over the guidewire for the delivery sheath   of the [name] transjugular core biopsy needle. Multiple 18-gauge core   liver biopsy samples were obtained. The sheath was removed and   hemostasis achieved at the site. Patient tolerated procedure well,   with no immediate  complication.    Impression:   1. Right hepatic venogram confirms appropriate catheter position.   2.  Pressure measurements indicate a mean portosystemic gradient of   9 mmHg.   2. Technically successful transjugular core liver biopsy   HEPATIC VENOGRAM:    Read By:  Deanne Coffer, D. Reuel Boom,  M.D.   Released By:  Kyle [name]. Reuel Boom,  M.D.  Additional Information  HL7 RESULT STATUS : F  External image : [id number]  External IF Update Timestamp : 2009-05-04:11:51:58.000000   SP-Surgical Pathology - STATUS: Final  .                                         Perform Date: 11Jun10 00:01  Ordered By: Kyle [name] , Kyle [name]        Ordered Date: 11Jun10 12:10  Facility: [facility]                              Department: CPATH  Service Report Text  [facility]   [address]   [place], [place] [address]   [phone]    REPORT OF SURGICAL PATHOLOGY    Case #: [id number]   Patient Name: Kyle [name], Kyle [name].   Office Chart Number: N/A    MRN: [id number]   Pathologist: Ferd Hibbs. Colonel Bald, MD   DOB/Age [birth year] (Age: 55) Gender: M   Date Taken: 05/04/2009   Date Received: 05/04/2009    FINAL DIAGNOSIS    ***MICROSCOPIC EXAMINATION AND DIAGNOSIS***    LIVER, NEEDLE CORE BIOPSY:   - STEATOHEPATITIS WITH CIRRHOSIS.   - SEE COMMENT.    COMMENT   The core biopsies show steatohepatitis in the form of mixed micro   and macro vesicular steatosis with lymphocytic inflammation.   There are a few scattered neutrophils around. In addition, there   are scattered degenerative hepatocytes. The trichrome stain   reveals increase in portal fibrosis with extension into the   hepatic parenchyma, surrounding individual and small groups of   hepatocytes. This same pattern is also seen with the reticulin   stain. An iron stain reveals no increase in iron storage. The   controls stained appropriately.   Overall, the liver core biopsies show features of steatohepatitis   with  cirrhosis. Dr. Tammi Sou has reviewed the case and   concurs with this interpretation. (JBK:mj 05/08/09)    mj   Date Reported: 05/09/2009 Ferd Hibbs. Colonel Bald, MD   *** Electronically Signed Out By JBK ***    Clinical information   Fatty liver; enlarged spleen (lmb)    specimen(s) obtained   Liver, needle/core biopsy    Gross Description   Received in formalin in two containers are fragments of soft tan   red tissue which have an aggregate measurement of 0.5 x 0.5 x 0.2   cm. The specimen is submitted in toto. (GRP:kv 05-04-09)    kv/   Additional Information  HL7 RESULT STATUS : F  External IF Update Timestamp : 2009-05-04:12:09:00.000000

## 2010-12-24 NOTE — Assessment & Plan Note (Signed)
Summary: heme + stool/ss   Visit Type:  Follow-up Visit [facility]   Chief Complaint:  follow up- doing well.  History of Present Illness: Followup Kyle [name] cirrhosis history of iron deficiency anemia with negative extensive workup. Last EGD 2008. Colonoscopy last year by Dr. Darrick Penna for hematochezia felt to be secondary to hemorrhoids. He's done well.   GERD symptoms are well controlled;  he is due for a hepatic ultrasound and alpha-fetoprotein tumor marker at this;  since we last saw him he fell and injured his left shoulder required surgery. Overall he is doing well - hasn't had any melena or rectal bleeding doesn't have abdominal pain and odynophagia or dysphagia. History of low-grade bladder carcinoma being followed closely by Dr. Verlee Rossetti.  Current Problems (verified): 1)  Other Chronic Nonalcoholic Liver Disease  (ICD-571.8) 2)  Hypertension  (ICD-401.9) 3)  Hepatic Cirrhosis  (ICD-571.5) 4)  Shoulder Pain, Left  (ICD-719.41) 5)  Gi Bleeding  (ICD-578.9) 6)  Hemoccult Positive Stool  (ICD-578.1) 7)  Smoker  (ICD-305.1) 8)  Arthritis  (ICD-716.90) 9)  Restless Leg Syndrome  (ICD-333.94) 10)  Gerd  (ICD-530.81) 11)  Anemia-iron Deficiency  (ICD-280.9) 12)  Anemia, Vitamin B12 Deficiency  (ICD-281.1) 13)  Chest Discomfort  (ICD-786.59) 14)  Insomnia  (ICD-780.52) 15)  Adrenal Mass  (ICD-255.8) 16)  Bladder Cancer  (ICD-188.9) 17)  Restless Leg Syndrome  (ICD-333.94) 18)  Sleep Apnea, Obstructive, Moderate  (ICD-327.23) 19)  Proteinuria  (ICD-791.0) 20)  Diabetes Mellitus, Type II, Controlled, With Complications  (ICD-250.90) 21)  Weight Gain  (ICD-783.1) 22)  Fatigue  (ICD-780.79) 23)  Abnormal Electrocardiogram  (ICD-794.31) 24)  Hypertension  (ICD-401.9) 25)  Hyperlipidemia  (ICD-272.4) 26)  Depression  (ICD-311)  Current Medications (verified): 1)  Lisinopril 20 Mg Tabs (Lisinopril) .... Once Daily 2)  Omeprazole 40 Mg Cpdr (Omeprazole)  .[name].. 1 By Mouth Once Daily 3)  Metformin Hcl 1000 Mg Tabs (Metformin Hcl) .[name].. 1 By Mouth Two Times A Day 4)  Atenolol 100 Mg Tabs (Atenolol) .... 1/2 By Mouth Once Daily 5)  Simvastatin 20 Mg Tabs (Simvastatin) .[name].. 1 By Mouth Once Daily 6)  Gabapentin 300 Mg Caps (Gabapentin) .... One Cap By Mouth Three Times A Day 7)  Glipizide 10 Mg Tabs (Glipizide) .[name].. 1 By Mouth Two Times A Day 8)  Novolog Mix 70/30 70-30 % Susp (Insulin Aspart Prot & Aspart) .... 90 Units Subcutaneously Before Breakfast and 70 Units Subcutaneously Before Dinner 9)  Multivitamins  Tabs (Multiple Vitamin) .[name].. 1 By Mouth Once Daily 10)  Fish Oil Concentrate 1000 Mg Caps (Omega-3 Fatty Acids) .[name].. 1 By Mouth Two Times A Day 11)  Mirapex 0.5 Mg Tabs (Pramipexole Dihydrochloride) .[name].. 1 By Mouth At Bedtime 12)  Trazodone Hcl 100 Mg Tabs (Trazodone Hcl) .[name].. 1 By Mouth 1 Hour Before Bed 13)  Cyanocobalamin 1000 Mcg/ml Soln (Cyanocobalamin) .[name].. 1000 Ml Subcutaneously Q Month 14)  Aspirin 81 Mg Tabs (Aspirin) .... Once Daily 15)  Insulin Syringe 30g X 1/2" 1 Ml Misc (Insulin Syringe-Needle U-100) .... 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 Darrick Penna 2010- hemorroid bleeding' negative celiac antibody  Family History: Last updated: 04/24/2009 father-deceased-57-cirrohosis,  ETOH abuse mother-69-DM, RLS, kidney cancer brother-52-HIV, esophageal varices son-26 son-24 son-22  Social History: Last updated: 07/23/2009 Seperated lives with parents Current Smoker-1 1/2 ppd x25  years Alcohol use-no Drug use-no  Risk Factors: Smoking Status: current (10/04/2008)  Past Surgical History: removal of bladder cancer left adrenalectomy Carpal tunnel surgery 10-2009 L shoulder  Vital Signs:  Patient profile:   55 year old male Height:      68 inches Weight:      238 pounds BMI:     36.32 Temp:     97.2 degrees F oral Pulse rate:   76 / minute BP sitting:   128 / 80  (right arm) Cuff size:   regular  Vitals Entered By: Hendricks Limes LPN (June 06, 2010 9:49 AM)  Physical Exam  General:  a very pleasant gentleman resting comfortably in no acute distress. Well oriented. Eyes:  no scleral icterus. Conjunctiva are peak Abdomen:  obese no shifting dullness or fluid wave. Positive bowel sounds; spleen not definite  ballotable -  liver edge palpable just below the right costal margin soft entirely nontender without appreciable mass or organomegaly extremities no edema  Impression & Recommendations: Impression: [name]/cirrhosis. History of iron deficiency anemia /  GI bleeding with extensive negative workup. Overall he is doing well at this time. Needs followup labs -  alpha-fetoprotein and check hepatic ultrasound. It appears he will be due for a screening EGD in the near future  Recommendations: INR chem 20 CBC and hepatic ultrasound in the near future further recommendations to follow.  Other Orders: T-AFP Tumor Markers [phone]) T-PT (Prothrombin Time) (09811) T-CBC w/Diff ([id number]) T-Hepatic Function [id number]) T-Basic Metabolic Panel [phone])  Appended Document: heme + stool/ss prior capsule study - couple of small bowel erosions/ prior liver bx - steatohepatitis w cirrhosis/ hepatic vein wedge pressure 28 mm  Appended Document: Orders Update    Clinical Lists Changes  Orders: Added new Service order of Est. Patient Level IV (24401) - Signed

## 2010-12-24 NOTE — Assessment & Plan Note (Signed)
Summary: fu ov 6 mo,cirrhosis,gerd,hx ida/ams   Visit Type:  Follow-up Visit Primary Care Provider:  Isac Sarna  Chief Complaint:  6 month follow up- doing ok.  History of Present Illness: 55 year old gentleman with biopsy-proven steatohepatitis with cirrhosis. Return for followup. He now sees Dr. Margo Aye for his primary care needs. Extensive workup last year and in the couple years previously for iron deficiency anemia reveal no significant lesions in the past couple years He's had an EGD, colonoscopy and capsule study of the small bowel. He presented with trivial rectal bleeding last year. Dr. Darrick Penna found hemorrhoids as the most likely cause on tcs. Prior small bowel capsule study demonstrated some duodenal erosions. Celiac antibody panel previously came back negative. He has received parenteral iron under the direction Dr. Mariel Sleet. He has a long history of poorly controlled diabetes and obesity. Also earlier in his life, has a 10 year history of drinking approximately 4 hard liquor drinks a daily.  He has received a vaccination for hepatitis A and B. last year. He's been worked up a Dr. Dietrich Pates for syncope. Obstructive sleep apnea -  well controlled with CPAP. He continues to smoke. Family history is significant in this dad died with cirrhosis. He did have a history of heavy alcohol consumption.  MRI last year demonstrated a right lobe hepatic cyst. No evidence of hepatoma. His other pertinent past medical history includes resection of a bladder tumor as  well as a left adrenalectomy for what reportedly turned to be the benign disease.  Kyle [name] states he's doing well these days. He denies melena or hematochezia; no abdominal pain  He has proteinuria which is being worked up by the [employer]. [name] He did have some hyperkalemia for which Lasix and Aldactone have been stopped Dr. Margo Aye.  I am pleased alert Kyle [name] is getting aerobic exercise 3 times weekly and he has lost down from the 250s  to 238 pounds as documented today in our office.  He did not have any evidence of portal hypertension on his 10/28 /2008 EGD done by me.  He had a 3 cm tongue of salmon-colored epithelium in the distal esophagus. Biopsies, however, negative for Barre  Current Problems (verified): 1)  Hypertension  (ICD-401.9) 2)  Hepatic Cirrhosis  (ICD-571.5) 3)  Shoulder Pain, Left  (ICD-719.41) 4)  Gi Bleeding  (ICD-578.9) 5)  Hemoccult Positive Stool  (ICD-578.1) 6)  Smoker  (ICD-305.1) 7)  Arthritis  (ICD-716.90) 8)  Restless Leg Syndrome  (ICD-333.94) 9)  Gerd  (ICD-530.81) 10)  Anemia-iron Deficiency  (ICD-280.9) 11)  Anemia, Vitamin B12 Deficiency  (ICD-281.1) 12)  Chest Discomfort  (ICD-786.59) 13)  Insomnia  (ICD-780.52) 14)  Adrenal Mass  (ICD-255.8) 15)  Bladder Cancer  (ICD-188.9) 16)  Restless Leg Syndrome  (ICD-333.94) 17)  Sleep Apnea, Obstructive, Moderate  (ICD-327.23) 18)  Proteinuria  (ICD-791.0) 19)  Diabetes Mellitus, Type II, Controlled, With Complications  (ICD-250.90) 20)  Weight Gain  (ICD-783.1) 21)  Fatigue  (ICD-780.79) 22)  Abnormal Electrocardiogram  (ICD-794.31) 23)  Hypertension  (ICD-401.9) 24)  Hyperlipidemia  (ICD-272.4) 25)  Depression  (ICD-311)  Current Medications (verified): 1)  Lisinopril 40 Mg Tabs (Lisinopril) .[name].. 1 By Mouth Once Daily 2)  Omeprazole 40 Mg Cpdr (Omeprazole) .[name].. 1 By Mouth Once Daily 3)  Metformin Hcl 1000 Mg Tabs (Metformin Hcl) .[name].. 1 By Mouth Two Times A Day 4)  Atenolol 100 Mg Tabs (Atenolol) .... 1/2 By Mouth Once Daily 5)  Simvastatin 20 Mg Tabs (Simvastatin) .Marland KitchenMarland KitchenMarland Kitchen 1  By Mouth Once Daily 6)  Gabapentin 300 Mg Caps (Gabapentin) .... One Cap By Mouth Three Times A Day 7)  Glipizide 10 Mg Tabs (Glipizide) .[name].. 1 By Mouth Two Times A Day 8)  Novolog Mix 70/30 70-30 % Susp (Insulin Aspart Prot & Aspart) .... 90 Units Subcutaneously Before Breakfast and 70 Units Subcutaneously Before Dinner 9)  Multivitamins  Tabs (Multiple Vitamin)  .[name].. 1 By Mouth Once Daily 10)  Fish Oil Concentrate 1000 Mg Caps (Omega-3 Fatty Acids) .[name].. 1 By Mouth Two Times A Day 11)  Mirapex 0.5 Mg Tabs (Pramipexole Dihydrochloride) .[name].. 1 By Mouth At Bedtime 12)  Trazodone Hcl 100 Mg Tabs (Trazodone Hcl) .[name].. 1 By Mouth 1 Hour Before Bed 13)  Cyanocobalamin 1000 Mcg/ml Soln (Cyanocobalamin) .[name].. 1000 Ml Subcutaneously Q Month 14)  Aspirin 81 Mg Tabs (Aspirin) .... Once Daily 15)  Insulin Syringe 30g X 1/2" 1 Ml Misc (Insulin Syringe-Needle U-100) .... 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 Darrick Penna 2010- hemorroid bleeding' negative celiac antibody  Past Surgical History: removal of bladder cancer left adrenalectomy Carpal tunnel surgery 10-2009  Vital Signs:  Patient profile:   55 year old male Height:      68 inches Weight:      238 pounds BMI:     36.32 Temp:     97.8 degrees F oral Pulse rate:   80 / minute BP sitting:   128 / 92  (left arm) Cuff size:   regular  Vitals Entered By: Hendricks Limes LPN (January 02, 2010 8:56 AM)  Physical Exam  General:  alert conversant gentleman in no acute distress; he is well oriented and conversant Eyes:  no  scleral icterus can have a repeat Abdomen:  obese positive bowel sounds. No shifting fluid wave or dullness to percussion liver edge indistinct. I can barely block the spleen. The abdomen is entirely non-tender Extremities:  trace lower extremity edema  Impression & Recommendations: Impression: 55 year old gentleman with steatohepatitis biopsy-proven hepatic cirrhosis with secondary splenomegaly most likely secondary to  poorly controlled diabetes mellitus although he has had notable alcohol exposure earlier in his life.  Currently, he is well compensated. I'm glad to see he is losing weight and getting aerobic exercise.  Recommendations: He should have a surveillance EGD later this year to recheck for esophageal varices. He should have a serum alpha-fetoprotein tumor marker at this time. I've encouraged him to followup as directed with his PCPl.  I want to send him home with one immunofecal occult stool test  - to be returned; recheck iron studies today. Further recommendations to follow.  Other Orders: T-Iron [id number]) T-Iron Binding Capacity (TIBC) ([id number]) T-Ferritin [phone]) T-Hepatitis B Surface Antigen [phone]) T-Hepatitis C Antibody ([id number]) Est. Patient Level IV (10272)

## 2010-12-24 NOTE — Letter (Signed)
Summary: CT Request   CT Request   Imported By: Peggyann Shoals 06/12/2010 13:53:53  _____________________________________________________________________  External Attachment:    Type:   Image     Comment:   External Document

## 2010-12-24 NOTE — Letter (Signed)
Summary: Internal Other  Internal Other   Imported By: Peggyann Shoals 06/06/2010 12:03:48  _____________________________________________________________________  External Attachment:    Type:   Image     Comment:   External Document

## 2010-12-24 NOTE — Letter (Signed)
Summary: insurance confirmation  insurance confirmation   Imported By: Minna Merritts 06/14/2010 17:37:28  _____________________________________________________________________  External Attachment:    Type:   Image     Comment:   External Document

## 2010-12-24 NOTE — Miscellaneous (Signed)
Summary: Orders Update  Clinical Lists Changes  Orders: Added new Test order of T-CBC w/Diff ([id number]) - Signed 

## 2010-12-24 NOTE — Letter (Signed)
Summary: Recall, Labs Needed  [facility]  [address]   [place], [place] [address]   Phone: [phone]  Fax: [phone]    March 14, 2010  Kyle [name] [address] [place]  [address] Jun 23, 1956   Dear Mr. Suto,   Our records indicate it is time to repeat your blood work.  You can take the enclosed form to the lab on or near the date indicated.  Please make note of the new location of the lab:   [address], [address]nd floor   [facility]  Our office will call you within a week to ten business days with the results.  If you do not hear from [place] in 10 business days, you should call the office.  If you have any questions regarding this, call the office at [phone], and ask for the nurse.  Labs are due on 04/15/10.   Sincerely,    Hendricks Limes LPN  [facility] Ph: [phone]   Fax: [phone]

## 2010-12-24 NOTE — Assessment & Plan Note (Signed)
Summary: CRD.GLU   Allergies: No Known Drug Allergies  Appended Document: Orders Update pt returned ifobt and it was positive   Clinical Lists Changes  Orders: Added new Service order of Immuno-chemical Fecal Occult (60454) - Signed

## 2010-12-24 NOTE — Letter (Signed)
Summary: LABS/DR Kyle [name]  LABS/DR Kyle [name]   Imported By: Cloria Spring LPN 19/14/7829 56:21:30  _____________________________________________________________________  External Attachment:    Type:   Image     Comment:   External Document

## 2010-12-24 NOTE — Letter (Signed)
Summary: LABS/DR HALL  LABS/DR HALL   Imported By: Cloria Spring LPN 04/54/0981 19:14:78  _____________________________________________________________________  External Attachment:    Type:   Image     Comment:   External Document

## 2010-12-24 NOTE — Letter (Signed)
Summary: LABS/DR HALL  LABS/DR HALL   Imported By: Cloria Spring LPN 16/08/9603 54:09:81  _____________________________________________________________________  External Attachment:    Type:   Image     Comment:   External Document

## 2010-12-24 NOTE — Letter (Signed)
Summary: External Other  External Other   Imported By: Peggyann Shoals 06/06/2010 15:57:44  _____________________________________________________________________  External Attachment:    Type:   Image     Comment:   External Document

## 2010-12-25 ENCOUNTER — Encounter (HOSPITAL_COMMUNITY): Payer: Self-pay | Admitting: Oncology

## 2010-12-25 ENCOUNTER — Ambulatory Visit (HOSPITAL_COMMUNITY): Payer: Self-pay | Admitting: Oncology

## 2010-12-26 ENCOUNTER — Other Ambulatory Visit (HOSPITAL_COMMUNITY): Payer: Self-pay

## 2010-12-27 ENCOUNTER — Ambulatory Visit (HOSPITAL_COMMUNITY): Payer: Medicaid Other | Admitting: Oncology

## 2010-12-27 ENCOUNTER — Ambulatory Visit (HOSPITAL_COMMUNITY): Payer: Medicaid Other

## 2010-12-27 DIAGNOSIS — D509 Iron deficiency anemia, unspecified: Secondary | ICD-10-CM

## 2011-01-02 ENCOUNTER — Ambulatory Visit: Payer: Medicaid Other | Admitting: Internal Medicine

## 2011-01-02 ENCOUNTER — Encounter: Payer: Self-pay | Admitting: Internal Medicine

## 2011-01-09 ENCOUNTER — Encounter: Payer: Self-pay | Admitting: Internal Medicine

## 2011-01-09 NOTE — Letter (Signed)
Summary: [facility]  [facility]   Imported By: Rexene Alberts 01/02/2011 10:14:03  _____________________________________________________________________  External Attachment:    Type:   Image     Comment:   External Document

## 2011-01-10 ENCOUNTER — Encounter: Payer: Self-pay | Admitting: Cardiology

## 2011-01-15 NOTE — Letter (Signed)
Summary: [facility]  [facility]   Imported By: Rexene Alberts 01/09/2011 08:57:35  _____________________________________________________________________  External Attachment:    Type:   Image     Comment:   External Document

## 2011-01-15 NOTE — Letter (Signed)
Summary: [facility]  [facility]   Imported By: Faythe Ghee 01/10/2011 16:48:26  _____________________________________________________________________  External Attachment:    Type:   Image     Comment:   External Document

## 2011-01-20 ENCOUNTER — Encounter: Payer: Self-pay | Admitting: Cardiology

## 2011-01-30 NOTE — Letter (Signed)
Summary: [facility] CENTER NOTE  [facility] NOTE   Imported By: Faythe Ghee 01/20/2011 10:15:35  _____________________________________________________________________  External Attachment:    Type:   Image     Comment:   External Document

## 2011-02-07 LAB — COMPREHENSIVE METABOLIC PANEL
ALT: 55 U/L — ABNORMAL HIGH (ref 0–53)
AST: 57 U/L — ABNORMAL HIGH (ref 0–37)
CO2: 27 mEq/L (ref 19–32)
Chloride: 101 mEq/L (ref 96–112)
GFR calc Af Amer: >60 mL/min (ref >60)
GFR calc non Af Amer: >60 mL/min (ref >60)
Potassium: 5.7 mEq/L — ABNORMAL HIGH (ref 3.5–5.1)
Sodium: 138 mEq/L (ref 135–145)
Total Bilirubin: 0.4 mg/dL (ref 0.3–1.2)

## 2011-02-07 LAB — GLUCOSE, CAPILLARY
Glucose-Capillary: 191 mg/dL — ABNORMAL HIGH (ref 70–99)
Glucose-Capillary: 259 mg/dL — ABNORMAL HIGH (ref 70–99)

## 2011-02-07 LAB — CBC
Hemoglobin: 13.6 g/dL (ref 13.0–17.0)
RBC: 4.29 MIL/uL (ref 4.22–5.81)
WBC: 5.6 10*3/uL (ref 4.0–10.5)

## 2011-02-07 LAB — APTT: aPTT: 30 s (ref 24–37)

## 2011-02-07 LAB — SURGICAL PCR SCREEN: MRSA, PCR: NEGATIVE

## 2011-02-12 LAB — CBC
HCT: 43.8 % (ref 39.0–52.0)
Hemoglobin: 15 g/dL (ref 13.0–17.0)
RDW: 17.5 % — ABNORMAL HIGH (ref 11.5–15.5)
WBC: 6.7 10*3/uL (ref 4.0–10.5)

## 2011-02-24 LAB — FERRITIN: Ferritin: 246 ng/mL (ref 22–322)

## 2011-02-24 LAB — CBC
Hemoglobin: 13 g/dL (ref 13.0–17.0)
MCV: 92.3 fL (ref 78.0–100.0)
RBC: 4.2 MIL/uL — ABNORMAL LOW (ref 4.22–5.81)
WBC: 6 10*3/uL (ref 4.0–10.5)

## 2011-02-26 LAB — CBC
HCT: 34.9 % — ABNORMAL LOW (ref 39.0–52.0)
Hemoglobin: 11.6 g/dL — ABNORMAL LOW (ref 13.0–17.0)
MCV: 88.8 fL (ref 78.0–100.0)
RDW: 15.4 % (ref 11.5–15.5)
WBC: 7.4 10*3/uL (ref 4.0–10.5)

## 2011-02-28 LAB — CBC
Hemoglobin: 11.9 g/dL — ABNORMAL LOW (ref 13.0–17.0)
MCHC: 34.5 g/dL (ref 30.0–36.0)
MCV: 89.2 fL (ref 78.0–100.0)
RBC: 3.88 MIL/uL — ABNORMAL LOW (ref 4.22–5.81)
RDW: 16.7 % — ABNORMAL HIGH (ref 11.5–15.5)

## 2011-02-28 LAB — DIFFERENTIAL
Basophils Absolute: 0 10*3/uL (ref 0.0–0.1)
Basophils Relative: 0 % (ref 0–1)
Eosinophils Absolute: 0.2 10*3/uL (ref 0.0–0.7)
Monocytes Absolute: 0.4 10*3/uL (ref 0.1–1.0)
Monocytes Relative: 8 % (ref 3–12)

## 2011-03-02 LAB — BASIC METABOLIC PANEL
BUN: 13 mg/dL (ref 6–23)
BUN: 18 mg/dL (ref 6–23)
BUN: 21 mg/dL (ref 6–23)
CO2: 23 mEq/L (ref 19–32)
CO2: 30 mEq/L (ref 19–32)
Calcium: 8.4 mg/dL (ref 8.4–10.5)
Calcium: 8.8 mg/dL (ref 8.4–10.5)
Chloride: 104 mEq/L (ref 96–112)
Creatinine, Ser: 1.24 mg/dL (ref 0.4–1.5)
Creatinine, Ser: 1.4 mg/dL (ref 0.4–1.5)
Creatinine, Ser: 1.55 mg/dL — ABNORMAL HIGH (ref 0.4–1.5)
GFR calc Af Amer: >60 mL/min (ref >60)
GFR calc non Af Amer: 47 mL/min — ABNORMAL LOW (ref >60)
GFR calc non Af Amer: 56 mL/min — ABNORMAL LOW (ref >60)
GFR calc non Af Amer: >60 mL/min (ref >60)
Glucose, Bld: 169 mg/dL — ABNORMAL HIGH (ref 70–99)
Glucose, Bld: 254 mg/dL — ABNORMAL HIGH (ref 70–99)
Sodium: 139 mEq/L (ref 135–145)

## 2011-03-02 LAB — DIFFERENTIAL
Basophils Absolute: 0 10*3/uL (ref 0.0–0.1)
Eosinophils Relative: 1 % (ref 0–5)
Lymphocytes Relative: 37 % (ref 12–46)
Lymphs Abs: 2 10*3/uL (ref 0.7–4.0)
Neutrophils Relative %: 56 % (ref 43–77)

## 2011-03-02 LAB — CBC
MCHC: 32.3 g/dL (ref 30.0–36.0)
MCV: 88.1 fL (ref 78.0–100.0)
Platelets: 129 10*3/uL — ABNORMAL LOW (ref 150–400)
RDW: 23.4 % — ABNORMAL HIGH (ref 11.5–15.5)

## 2011-03-02 LAB — GLUCOSE, CAPILLARY
Glucose-Capillary: 140 mg/dL — ABNORMAL HIGH (ref 70–99)
Glucose-Capillary: 147 mg/dL — ABNORMAL HIGH (ref 70–99)
Glucose-Capillary: 190 mg/dL — ABNORMAL HIGH (ref 70–99)
Glucose-Capillary: 240 mg/dL — ABNORMAL HIGH (ref 70–99)
Glucose-Capillary: 289 mg/dL — ABNORMAL HIGH (ref 70–99)

## 2011-03-02 LAB — HEMOGLOBIN AND HEMATOCRIT, BLOOD
HCT: 38 % — ABNORMAL LOW (ref 39.0–52.0)
Hemoglobin: 10.7 g/dL — ABNORMAL LOW (ref 13.0–17.0)
Hemoglobin: 10.9 g/dL — ABNORMAL LOW (ref 13.0–17.0)
Hemoglobin: 11.2 g/dL — ABNORMAL LOW (ref 13.0–17.0)

## 2011-03-02 LAB — TYPE AND SCREEN: ABO/RH(D): O POS

## 2011-03-03 LAB — CROSSMATCH
ABO/RH(D): O POS
Antibody Screen: NEGATIVE

## 2011-03-03 LAB — DIFFERENTIAL
Basophils Absolute: 0 10*3/uL (ref 0.0–0.1)
Basophils Relative: 0 % (ref 0–1)
Eosinophils Absolute: 0.1 10*3/uL (ref 0.0–0.7)
Eosinophils Absolute: 0.1 10*3/uL (ref 0.0–0.7)
Eosinophils Absolute: 0.1 10*3/uL (ref 0.0–0.7)
Eosinophils Relative: 3 % (ref 0–5)
Lymphocytes Relative: 35 % (ref 12–46)
Lymphocytes Relative: 30 % (ref 12–46)
Lymphs Abs: 1.2 10*3/uL (ref 0.7–4.0)
Lymphs Abs: 1.5 10*3/uL (ref 0.7–4.0)
Monocytes Absolute: 0.4 10*3/uL (ref 0.1–1.0)
Monocytes Relative: 10 % (ref 3–12)
Monocytes Relative: 8 % (ref 3–12)
Monocytes Relative: 9 % (ref 3–12)
Neutro Abs: 1.9 10*3/uL (ref 1.7–7.7)
Neutro Abs: 2.5 10*3/uL (ref 1.7–7.7)
Neutrophils Relative %: 53 % (ref 43–77)
Neutrophils Relative %: 52 % (ref 43–77)

## 2011-03-03 LAB — COMPREHENSIVE METABOLIC PANEL
ALT: 46 U/L (ref 0–53)
AST: 103 U/L — ABNORMAL HIGH (ref 0–37)
Albumin: 3.4 g/dL — ABNORMAL LOW (ref 3.5–5.2)
Alkaline Phosphatase: 39 U/L (ref 39–117)
Calcium: 9.4 mg/dL (ref 8.4–10.5)
GFR calc Af Amer: >60 mL/min (ref >60)
Potassium: 4.9 mEq/L (ref 3.5–5.1)
Sodium: 139 mEq/L (ref 135–145)
Total Protein: 7.4 g/dL (ref 6.0–8.3)

## 2011-03-03 LAB — BASIC METABOLIC PANEL
BUN: 15 mg/dL (ref 6–23)
BUN: 17 mg/dL (ref 6–23)
Calcium: 9.6 mg/dL (ref 8.4–10.5)
Calcium: 9.1 mg/dL (ref 8.4–10.5)
Chloride: 105 mEq/L (ref 96–112)
Chloride: 103 mEq/L (ref 96–112)
Chloride: 108 mEq/L (ref 96–112)
Creatinine, Ser: 1.06 mg/dL (ref 0.4–1.5)
Creatinine, Ser: 1.25 mg/dL (ref 0.4–1.5)
Creatinine, Ser: 1.31 mg/dL (ref 0.4–1.5)
Creatinine, Ser: 2.4 mg/dL — ABNORMAL HIGH (ref 0.4–1.5)
GFR calc Af Amer: >60 mL/min (ref >60)
GFR calc Af Amer: >60 mL/min (ref >60)
GFR calc Af Amer: >60 mL/min (ref >60)
GFR calc Af Amer: 35 mL/min — ABNORMAL LOW (ref >60)
GFR calc non Af Amer: >60 mL/min (ref >60)
GFR calc non Af Amer: 57 mL/min — ABNORMAL LOW (ref >60)
GFR calc non Af Amer: >60 mL/min (ref >60)
Sodium: 138 mEq/L (ref 135–145)

## 2011-03-03 LAB — GLUCOSE, CAPILLARY
Glucose-Capillary: 127 mg/dL — ABNORMAL HIGH (ref 70–99)
Glucose-Capillary: 137 mg/dL — ABNORMAL HIGH (ref 70–99)
Glucose-Capillary: 138 mg/dL — ABNORMAL HIGH (ref 70–99)
Glucose-Capillary: 124 mg/dL — ABNORMAL HIGH (ref 70–99)
Glucose-Capillary: 131 mg/dL — ABNORMAL HIGH (ref 70–99)
Glucose-Capillary: 138 mg/dL — ABNORMAL HIGH (ref 70–99)
Glucose-Capillary: 78 mg/dL (ref 70–99)
Glucose-Capillary: 178 mg/dL — ABNORMAL HIGH (ref 70–99)
Glucose-Capillary: 91 mg/dL (ref 70–99)

## 2011-03-03 LAB — HEMOGLOBIN: Hemoglobin: 10.6 g/dL — ABNORMAL LOW (ref 13.0–17.0)

## 2011-03-03 LAB — CBC
Hemoglobin: 9.9 g/dL — ABNORMAL LOW (ref 13.0–17.0)
Hemoglobin: 9.2 g/dL — ABNORMAL LOW (ref 13.0–17.0)
MCHC: 33.3 g/dL (ref 30.0–36.0)
MCV: 82.5 fL (ref 78.0–100.0)
MCV: 83 fL (ref 78.0–100.0)
Platelets: 183 10*3/uL (ref 150–400)
Platelets: 196 10*3/uL (ref 150–400)
Platelets: 201 10*3/uL (ref 150–400)
RBC: 3.53 MIL/uL — ABNORMAL LOW (ref 4.22–5.81)
RBC: 3.69 MIL/uL — ABNORMAL LOW (ref 4.22–5.81)
RBC: 3.48 MIL/uL — ABNORMAL LOW (ref 4.22–5.81)
RDW: 23.9 % — ABNORMAL HIGH (ref 11.5–15.5)
WBC: 3.5 10*3/uL — ABNORMAL LOW (ref 4.0–10.5)
WBC: 4.1 10*3/uL (ref 4.0–10.5)
WBC: 3.8 10*3/uL — ABNORMAL LOW (ref 4.0–10.5)

## 2011-03-03 LAB — HEMOGLOBIN AND HEMATOCRIT, BLOOD: Hemoglobin: 10.4 g/dL — ABNORMAL LOW (ref 13.0–17.0)

## 2011-03-03 LAB — HEMOGLOBIN A1C: Mean Plasma Glucose: 137 mg/dL

## 2011-03-03 LAB — HEMATOCRIT: HCT: 31.7 % — ABNORMAL LOW (ref 39.0–52.0)

## 2011-03-03 LAB — PROTIME-INR
INR: 1.1 (ref 0.00–1.49)
Prothrombin Time: 14.9 seconds (ref 11.6–15.2)

## 2011-03-04 LAB — CBC
HCT: 29.7 % — ABNORMAL LOW (ref 39.0–52.0)
Hemoglobin: 9.5 g/dL — ABNORMAL LOW (ref 13.0–17.0)
MCHC: 32 g/dL (ref 30.0–36.0)
MCV: 76.9 fL — ABNORMAL LOW (ref 78.0–100.0)
RBC: 3.81 MIL/uL — ABNORMAL LOW (ref 4.22–5.81)
RBC: 3.86 MIL/uL — ABNORMAL LOW (ref 4.22–5.81)
RDW: 18.9 % — ABNORMAL HIGH (ref 11.5–15.5)
WBC: 7.3 10*3/uL (ref 4.0–10.5)

## 2011-03-04 LAB — LIPID PANEL
LDL Cholesterol: UNDETERMINED mg/dL (ref 0–99)
Total CHOL/HDL Ratio: 5.9 RATIO
Triglycerides: 435 mg/dL — ABNORMAL HIGH (ref <150)
VLDL: UNDETERMINED mg/dL (ref 0–40)

## 2011-03-04 LAB — FERRITIN: Ferritin: 11 ng/mL — ABNORMAL LOW (ref 22–322)

## 2011-03-04 LAB — DIFFERENTIAL
Eosinophils Absolute: 0.1 10*3/uL (ref 0.0–0.7)
Eosinophils Relative: 2 % (ref 0–5)
Lymphs Abs: 2.1 10*3/uL (ref 0.7–4.0)
Monocytes Absolute: 0.5 10*3/uL (ref 0.1–1.0)
Monocytes Relative: 7 % (ref 3–12)

## 2011-03-04 LAB — BLEEDING TIME: Bleeding Time: 9 minutes (ref 2.5–9.5)

## 2011-03-04 LAB — RETICULOCYTES: Retic Ct Pct: 2.6 % (ref 0.4–3.1)

## 2011-03-04 LAB — IRON AND TIBC: TIBC: 437 ug/dL — ABNORMAL HIGH (ref 215–435)

## 2011-03-04 LAB — PROTIME-INR: Prothrombin Time: 14.5 seconds (ref 11.6–15.2)

## 2011-03-04 LAB — GLUCOSE, CAPILLARY: Glucose-Capillary: 123 mg/dL — ABNORMAL HIGH (ref 70–99)

## 2011-03-05 LAB — GLUCOSE, CAPILLARY
Glucose-Capillary: 113 mg/dL — ABNORMAL HIGH (ref 70–99)
Glucose-Capillary: 135 mg/dL — ABNORMAL HIGH (ref 70–99)
Glucose-Capillary: 172 mg/dL — ABNORMAL HIGH (ref 70–99)
Glucose-Capillary: 63 mg/dL — ABNORMAL LOW (ref 70–99)

## 2011-03-05 LAB — BASIC METABOLIC PANEL
Calcium: 9.6 mg/dL (ref 8.4–10.5)
GFR calc Af Amer: >60 mL/min (ref >60)
GFR calc Af Amer: >60 mL/min (ref >60)
GFR calc non Af Amer: 59 mL/min — ABNORMAL LOW (ref >60)
GFR calc non Af Amer: >60 mL/min (ref >60)
Glucose, Bld: 184 mg/dL — ABNORMAL HIGH (ref 70–99)
Potassium: 5.2 mEq/L — ABNORMAL HIGH (ref 3.5–5.1)
Sodium: 137 mEq/L (ref 135–145)
Sodium: 138 mEq/L (ref 135–145)

## 2011-03-05 LAB — HIV ANTIBODY (ROUTINE TESTING W REFLEX): HIV: NONREACTIVE

## 2011-03-05 LAB — HEPATIC FUNCTION PANEL
ALT: 33 U/L (ref 0–53)
Albumin: 3.5 g/dL (ref 3.5–5.2)
Alkaline Phosphatase: 44 U/L (ref 39–117)
Alkaline Phosphatase: 44 U/L (ref 39–117)
Bilirubin, Direct: 0.1 mg/dL (ref 0.0–0.3)
Indirect Bilirubin: 0.1 mg/dL — ABNORMAL LOW (ref 0.3–0.9)
Total Bilirubin: 0.6 mg/dL (ref 0.3–1.2)

## 2011-03-05 LAB — HOMOCYSTEINE: Homocysteine: 11.4 umol/L (ref 4.0–15.4)

## 2011-03-05 LAB — CBC
Hemoglobin: 9.4 g/dL — ABNORMAL LOW (ref 13.0–17.0)
RBC: 3.86 MIL/uL — ABNORMAL LOW (ref 4.22–5.81)
RDW: 18.1 % — ABNORMAL HIGH (ref 11.5–15.5)

## 2011-03-05 LAB — CARDIAC PANEL(CRET KIN+CKTOT+MB+TROPI)
CK, MB: 3.5 ng/mL (ref 0.3–4.0)
CK, MB: 3.6 ng/mL (ref 0.3–4.0)
Relative Index: 2.2 (ref 0.0–2.5)
Total CK: 167 U/L (ref 7–232)
Total CK: 180 U/L (ref 7–232)
Troponin I: 0.02 ng/mL (ref 0.00–0.06)

## 2011-03-05 LAB — IRON AND TIBC
Saturation Ratios: 5 % — ABNORMAL LOW (ref 20–55)
UIBC: 434 ug/dL

## 2011-03-05 LAB — LIPID PANEL: Cholesterol: 167 mg/dL (ref 0–200)

## 2011-03-05 LAB — HEPATITIS PANEL, ACUTE
Hep A IgM: NEGATIVE
Hep B C IgM: NEGATIVE
Hepatitis B Surface Ag: NEGATIVE

## 2011-03-05 LAB — GLIADIN ANTIBODIES, SERUM
Gliadin IgA: 3.5 U/mL (ref <7)
Gliadin IgG: 0.9 U/mL (ref <7)

## 2011-03-05 LAB — URINALYSIS, ROUTINE W REFLEX MICROSCOPIC
Ketones, ur: NEGATIVE mg/dL
Leukocytes, UA: NEGATIVE
Nitrite: NEGATIVE
Urobilinogen, UA: 0.2 mg/dL (ref 0.0–1.0)

## 2011-03-05 LAB — TSH: TSH: 1.028 u[IU]/mL (ref 0.350–4.500)

## 2011-03-05 LAB — MAGNESIUM: Magnesium: 2 mg/dL (ref 1.5–2.5)

## 2011-03-05 LAB — VITAMIN B12: Vitamin B-12: 158 pg/mL — ABNORMAL LOW (ref 211–911)

## 2011-03-05 LAB — HEMOCCULT GUIAC POC 1CARD (OFFICE): Fecal Occult Bld: NEGATIVE

## 2011-03-05 LAB — DIFFERENTIAL
Basophils Absolute: 0 10*3/uL (ref 0.0–0.1)
Lymphocytes Relative: 31 % (ref 12–46)
Monocytes Absolute: 0.5 10*3/uL (ref 0.1–1.0)
Neutro Abs: 3.9 10*3/uL (ref 1.7–7.7)
Neutrophils Relative %: 59 % (ref 43–77)

## 2011-03-05 LAB — FOLATE: Folate: >24 ng/mL

## 2011-03-05 LAB — RETICULIN ANTIBODIES, IGA W TITER: Reticulin Ab, IgA: NEGATIVE

## 2011-03-06 LAB — GLUCOSE, CAPILLARY
Glucose-Capillary: 67 mg/dL — ABNORMAL LOW (ref 70–99)
Glucose-Capillary: 136 mg/dL — ABNORMAL HIGH (ref 70–99)
Glucose-Capillary: 153 mg/dL — ABNORMAL HIGH (ref 70–99)
Glucose-Capillary: 195 mg/dL — ABNORMAL HIGH (ref 70–99)

## 2011-03-06 LAB — DIFFERENTIAL
Eosinophils Absolute: 0.1 10*3/uL (ref 0.0–0.7)
Eosinophils Relative: 2 % (ref 0–5)
Lymphs Abs: 1.9 10*3/uL (ref 0.7–4.0)
Monocytes Absolute: 0.6 10*3/uL (ref 0.1–1.0)
Monocytes Relative: 10 % (ref 3–12)

## 2011-03-06 LAB — POCT CARDIAC MARKERS
Myoglobin, poc: 98.8 ng/mL (ref 12–200)
Troponin i, poc: <0.05 ng/mL (ref 0.00–0.09)

## 2011-03-06 LAB — BASIC METABOLIC PANEL
BUN: 13 mg/dL (ref 6–23)
Chloride: 102 mEq/L (ref 96–112)
GFR calc Af Amer: >60 mL/min (ref >60)
Potassium: 3.5 mEq/L (ref 3.5–5.1)

## 2011-03-06 LAB — ABO/RH: ABO/RH(D): O POS

## 2011-03-06 LAB — CBC
HCT: 30.8 % — ABNORMAL LOW (ref 39.0–52.0)
Hemoglobin: 9.8 g/dL — ABNORMAL LOW (ref 13.0–17.0)
MCV: 77.7 fL — ABNORMAL LOW (ref 78.0–100.0)
RBC: 3.97 MIL/uL — ABNORMAL LOW (ref 4.22–5.81)
WBC: 5.9 10*3/uL (ref 4.0–10.5)

## 2011-03-06 LAB — HEMOGLOBIN AND HEMATOCRIT, BLOOD: Hemoglobin: 9.9 g/dL — ABNORMAL LOW (ref 13.0–17.0)

## 2011-03-11 LAB — BASIC METABOLIC PANEL
Chloride: 104 mEq/L (ref 96–112)
Creatinine, Ser: 1.12 mg/dL (ref 0.4–1.5)
GFR calc Af Amer: >60 mL/min (ref >60)
GFR calc non Af Amer: >60 mL/min (ref >60)

## 2011-03-11 LAB — HEMOGLOBIN AND HEMATOCRIT, BLOOD
HCT: 32 % — ABNORMAL LOW (ref 39.0–52.0)
Hemoglobin: 9.9 g/dL — ABNORMAL LOW (ref 13.0–17.0)

## 2011-03-24 ENCOUNTER — Encounter (HOSPITAL_COMMUNITY): Payer: Medicaid Other | Attending: Oncology

## 2011-03-24 DIAGNOSIS — D649 Anemia, unspecified: Secondary | ICD-10-CM

## 2011-03-24 DIAGNOSIS — D509 Iron deficiency anemia, unspecified: Secondary | ICD-10-CM | POA: Insufficient documentation

## 2011-03-25 DIAGNOSIS — Z9889 Other specified postprocedural states: Secondary | ICD-10-CM

## 2011-03-25 HISTORY — DX: Other specified postprocedural states: Z98.890

## 2011-03-26 ENCOUNTER — Ambulatory Visit (INDEPENDENT_AMBULATORY_CARE_PROVIDER_SITE_OTHER): Payer: Medicaid Other | Admitting: Internal Medicine

## 2011-03-26 ENCOUNTER — Ambulatory Visit (INDEPENDENT_AMBULATORY_CARE_PROVIDER_SITE_OTHER): Payer: Medicaid Other | Admitting: Gastroenterology

## 2011-03-26 ENCOUNTER — Encounter: Payer: Self-pay | Admitting: Gastroenterology

## 2011-03-26 VITALS — BP 131/77 | HR 87 | Temp 97.5°F | Ht 67.0 in | Wt 258.4 lb

## 2011-03-26 DIAGNOSIS — K219 Gastro-esophageal reflux disease without esophagitis: Secondary | ICD-10-CM

## 2011-03-26 DIAGNOSIS — D509 Iron deficiency anemia, unspecified: Secondary | ICD-10-CM

## 2011-03-26 DIAGNOSIS — D649 Anemia, unspecified: Secondary | ICD-10-CM

## 2011-03-26 DIAGNOSIS — K7460 Unspecified cirrhosis of liver: Secondary | ICD-10-CM

## 2011-03-26 LAB — COMPREHENSIVE METABOLIC PANEL
ALT: 50 U/L (ref 0–53)
AST: 80 U/L — ABNORMAL HIGH (ref 0–37)
Alkaline Phosphatase: 47 U/L (ref 39–117)
BUN: 20 mg/dL (ref 6–23)
Calcium: 9.5 mg/dL (ref 8.4–10.5)
Chloride: 103 mEq/L (ref 96–112)
Creat: 1.18 mg/dL (ref 0.40–1.50)
Total Bilirubin: 0.6 mg/dL (ref 0.3–1.2)

## 2011-03-26 LAB — AFP TUMOR MARKER: AFP-Tumor Marker: <1.3 ng/mL (ref 0.0–8.0)

## 2011-03-26 LAB — IFOBT (OCCULT BLOOD): IFOBT: NEGATIVE

## 2011-03-26 NOTE — Assessment & Plan Note (Signed)
Cirrhosis secondary to steatohepatitis. Due for lab work and abdominal ultrasound.

## 2011-03-26 NOTE — Progress Notes (Signed)
Pt returned ifobt and it was negative.  

## 2011-03-26 NOTE — Progress Notes (Signed)
Primary Care Physician:  Isac Sarna, MD  Primary Gastroenterologist:  Dr. Roetta Sessions   Chief Complaint  Patient presents with  . GI Bleeding    low ferritin, ?GI bleed    HPI:  Kyle [name] is a 55 y.o. male here for further evaluation of persistently low ferritin and question of GI bleeding. He has h/o IDA and underwent complete work-up in 2010 without obvious source identified. At that time, stools were heme positive. Denies recent occult blood testing. Denies brbpr, melena. BM 2-3 per day, formed to loose stool. Dry heaves every morning. C/O dry mouth. No heartburn on PPI. No dysphagia, abd pain. C/O overwhelming fatigue, DOE. Received iron infusion in February 2012. Prior to infusion his iron was 23, ferritin 10. He had repeat labs last week showed his ferritin remains significantly low at 10. His hemoglobin is 10.3. Platelet count 88,000. White count 5200. MCV 84.7.   Current Outpatient Prescriptions  Medication Sig Dispense Refill  . amoxicillin-clavulanate (AUGMENTIN) 875-125 MG per tablet Take 1 tablet by mouth 2 (two) times daily.        [name] aspirin 81 MG tablet Take 81 mg by mouth daily.        [name] atenolol (TENORMIN) 100 MG tablet Take 100 mg by mouth daily.        . clotrimazole (LOTRIMIN) 1 % cream Apply topically 2 (two) times daily.        . fish oil-omega-3 fatty acids 1000 MG capsule Take 2 g by mouth daily.        [name] gabapentin (NEURONTIN) 300 MG capsule Take 300 mg by mouth 3 (three) times daily.        . hydrochlorothiazide (,MICROZIDE/HYDRODIURIL,) 12.5 MG capsule Take 12.5 mg by mouth daily.        [name] lisinopril (PRINIVIL,ZESTRIL) 20 MG tablet Take 20 mg by mouth daily.        . metFORMIN (GLUMETZA) 1000 MG (MOD) 24 hr tablet Take 1,000 mg by mouth daily with breakfast.        . Multiple Vitamin (MULTIVITAMIN) capsule Take 1 capsule by mouth daily.        [name] omeprazole (PRILOSEC) 40 MG capsule Take 40 mg by mouth daily.        . pramipexole (MIRAPEX) 0.5 MG tablet Take  0.5 mg by mouth 3 (three) times daily.        . simvastatin (ZOCOR) 20 MG tablet Take 20 mg by mouth at bedtime.        . traMADol (ULTRAM) 50 MG tablet Take 50 mg by mouth every 6 (six) hours as needed.        . traZODone (DESYREL) 50 MG tablet Take 50 mg by mouth at bedtime.          Allergies as of 03/26/2011  . (No Known Allergies)    Past Medical History  Diagnosis Date  . DM (diabetes mellitus)   . Cirrhosis     bx proven steatohepatitis with cirrhosis (2010)  . HTN (hypertension)   . RLS (restless legs syndrome)   . Sleep apnea   . Hyperlipidemia   . IDA (iron deficiency anemia)   . GERD (gastroesophageal reflux disease)   . Depression   . Peripheral neuropathy   . Urothelial cancer     2010, paillary low-grade, h/o recurrence 2011  . B12 deficiency   . Psoriasis     Past Surgical History  Procedure Date  . Carpel tunnel   . Shoulder surgery   .  Adrenal mass surgery 05/2009    benign, left  . Bladder surgery 01/2009 and 06/2010    cancer 2010, small recurrence in 06/2010  . Colonoscopy 04/2009    moderate int hemorrhoids, rare sigmoid diverticula, one mm sessile hyperplastic rectal polyp  . Esophagogastroduodenoscopy 03/2009    small hh  . Small bowel capsule endoscopy 03/2009    couple of small benign appearing erosions, nonbleeding  . Egd/tcs 08/2007    small hiatal hernia, pancolonic diverticula, friable anal canal, 3cm salmon colored epithelium in distal esophagus, bx negative for Barrett's    Family History  Problem Relation Age of Onset  . Cirrhosis Father     etoh  . Colon cancer Neg Hx   . Kidney cancer Mother   . HIV Brother   . Cirrhosis Brother     nash    History   Social History  . Marital Status: Married    Spouse Name: N/A    Number of Children: 3  . Years of Education: N/A   Occupational History  . disabled    Social History Main Topics  . Smoking status: Current Everyday Smoker -- 1.5 packs/day    Types: Cigarettes  . Smokeless  tobacco: Not on file  . Alcohol Use: No     drank heavily for few years in 20s  . Drug Use: No  . Sexually Active: Not on file      ROS:  General: Negative for anorexia, weight loss, fever, chills. See HPI. Eyes: Negative for vision changes. Due for cataract sugery. ENT: Negative for hoarseness, difficulty swallowing , nasal congestion. CV: Negative for chest pain, angina, palpitations, dyspnea on exertion, peripheral edema.  Respiratory: See HPI. Negative for dyspnea at rest, cough, sputum, wheezing.  GI: See history of present illness. GU:  Negative for dysuria, hematuria, urinary incontinence, urinary frequency, nocturnal urination.  MS: Negative for joint pain, low back pain.  Derm: Negative for rash or itching.  Neuro: Negative for weakness, abnormal sensation, seizure, frequent headaches, memory loss, confusion.  Psych: Negative for anxiety, depression, suicidal ideation, hallucinations.  Endo: Negative for unusual weight change.  Heme: Negative for bruising or bleeding. Allergy: Negative for rash or hives.    Physical Examination:  BP 131/77  Pulse 87  Temp(Src) 97.5 F (36.4 C) (Tympanic)  Ht 5\' 7"  (1.702 m)  Wt 258 lb 6.4 oz (117.209 kg)  BMI 40.47 kg/m2   General: Well-nourished, well-developed in no acute distress.  Head: Normocephalic, atraumatic.   Eyes: Conjunctiva pink, no icterus. Mouth: Oropharyngeal mucosa moist and pink , no lesions erythema or exudate. Neck: Supple without thyromegaly, masses, or lymphadenopathy.  Lungs: Clear to auscultation bilaterally.  Heart: Regular rate and rhythm, no murmurs rubs or gallops.  Abdomen: Bowel sounds are normal, obese, psoriasis with central indention from previous surgical wound, nontender, nondistended, no hepatosplenomegaly or masses, no abdominal bruits or    hernia , no rebound or guarding.   Extremities: 1+ pitting edema bilaterally Neuro: Alert and oriented x 4 , grossly normal neurologically.  Skin: Warm  and dry, no rash or jaundice.   Psych: Alert and cooperative, normal mood and affect.

## 2011-03-26 NOTE — Progress Notes (Signed)
Cc to PCP 

## 2011-03-26 NOTE — Assessment & Plan Note (Signed)
Persistently low ferritin despite iron infusion. Hgb 13 (07/2010) and currently 10.3. Ferritin 10. History of IDA with complete GI workup in 2010 essentially unrevealing. Will check for occult blood. He is due for EGD at this time to check for esophageal varices. Further recommendations to follow.

## 2011-03-26 NOTE — Assessment & Plan Note (Signed)
Well controlled 

## 2011-03-28 ENCOUNTER — Encounter (HOSPITAL_COMMUNITY): Payer: Medicaid Other | Attending: Oncology | Admitting: Oncology

## 2011-03-28 ENCOUNTER — Encounter (HOSPITAL_COMMUNITY): Payer: Medicaid Other

## 2011-03-28 DIAGNOSIS — D509 Iron deficiency anemia, unspecified: Secondary | ICD-10-CM | POA: Insufficient documentation

## 2011-03-31 ENCOUNTER — Other Ambulatory Visit: Payer: Self-pay | Admitting: Internal Medicine

## 2011-03-31 ENCOUNTER — Other Ambulatory Visit (HOSPITAL_COMMUNITY): Payer: Medicaid Other

## 2011-03-31 DIAGNOSIS — K219 Gastro-esophageal reflux disease without esophagitis: Secondary | ICD-10-CM

## 2011-03-31 MED ORDER — PEG 3350-KCL-NA BICARB-NACL 420 G PO SOLR: ORAL | Status: AC

## 2011-04-01 ENCOUNTER — Ambulatory Visit (HOSPITAL_COMMUNITY)
Admission: RE | Admit: 2011-04-01 | Discharge: 2011-04-01 | Disposition: A | Discharge status: Home / Self Care | Payer: Medicaid Other | Source: Ambulatory Visit | Attending: Gastroenterology | Admitting: Gastroenterology

## 2011-04-01 DIAGNOSIS — E119 Type 2 diabetes mellitus without complications: Secondary | ICD-10-CM | POA: Insufficient documentation

## 2011-04-01 DIAGNOSIS — K7460 Unspecified cirrhosis of liver: Secondary | ICD-10-CM

## 2011-04-01 DIAGNOSIS — I10 Essential (primary) hypertension: Secondary | ICD-10-CM | POA: Insufficient documentation

## 2011-04-01 DIAGNOSIS — Z09 Encounter for follow-up examination after completed treatment for conditions other than malignant neoplasm: Secondary | ICD-10-CM | POA: Insufficient documentation

## 2011-04-01 DIAGNOSIS — Z8551 Personal history of malignant neoplasm of bladder: Secondary | ICD-10-CM | POA: Insufficient documentation

## 2011-04-08 ENCOUNTER — Encounter: Payer: Medicaid Other | Admitting: Internal Medicine

## 2011-04-08 ENCOUNTER — Ambulatory Visit (HOSPITAL_COMMUNITY)
Admission: RE | Admit: 2011-04-08 | Discharge: 2011-04-08 | Disposition: A | Discharge status: Home / Self Care | Payer: Medicaid Other | Source: Ambulatory Visit | Attending: Internal Medicine | Admitting: Internal Medicine

## 2011-04-08 ENCOUNTER — Other Ambulatory Visit: Payer: Self-pay | Admitting: Internal Medicine

## 2011-04-08 DIAGNOSIS — D509 Iron deficiency anemia, unspecified: Secondary | ICD-10-CM | POA: Insufficient documentation

## 2011-04-08 DIAGNOSIS — K7460 Unspecified cirrhosis of liver: Secondary | ICD-10-CM | POA: Insufficient documentation

## 2011-04-08 DIAGNOSIS — Z79899 Other long term (current) drug therapy: Secondary | ICD-10-CM | POA: Insufficient documentation

## 2011-04-08 DIAGNOSIS — E785 Hyperlipidemia, unspecified: Secondary | ICD-10-CM | POA: Insufficient documentation

## 2011-04-08 DIAGNOSIS — D126 Benign neoplasm of colon, unspecified: Secondary | ICD-10-CM | POA: Insufficient documentation

## 2011-04-08 DIAGNOSIS — E119 Type 2 diabetes mellitus without complications: Secondary | ICD-10-CM | POA: Insufficient documentation

## 2011-04-08 DIAGNOSIS — I10 Essential (primary) hypertension: Secondary | ICD-10-CM | POA: Insufficient documentation

## 2011-04-08 DIAGNOSIS — Z7982 Long term (current) use of aspirin: Secondary | ICD-10-CM | POA: Insufficient documentation

## 2011-04-08 DIAGNOSIS — K2940 Chronic atrophic gastritis without bleeding: Secondary | ICD-10-CM | POA: Insufficient documentation

## 2011-04-08 DIAGNOSIS — I8510 Secondary esophageal varices without bleeding: Secondary | ICD-10-CM | POA: Insufficient documentation

## 2011-04-08 LAB — GLUCOSE, CAPILLARY: Glucose-Capillary: 123 mg/dL — ABNORMAL HIGH (ref 70–99)

## 2011-04-08 NOTE — Op Note (Signed)
NAME:  Kyle, [name] NO.:  [id number]   MEDICAL RECORD NO.:  [id number]          PATIENT TYPE:  AMB   LOCATION:  DAY                           FACILITY:  [facility]   PHYSICIAN:  R. Roetta Sessions, M.D. DATE OF BIRTH:  Mar 28, 1956   DATE OF PROCEDURE:  DATE OF DISCHARGE:                               OPERATIVE REPORT   INDICATIONS FOR PROCEDURE:  A 53-year gentleman with iron-deficiency  anemia, Hemoccult-positive stool.  He has an adrenal mass being worked  up and there is a question of underlying occult cirrhosis.  He has  splenomegaly.  He recently underwent an EGD and capsule study of the  small bowel.  He was found to have only a couple of small bowel erosions  not felt to be an adequate explanation for the cause of iron deficiency  anemia via a mechanism of GI bleeding.  He had a colonoscopy in 2008 and  had diverticulosis but a poor prep compromised the exam.  He is brought  back today for a diagnostic colonoscopy hopefully in a setting of a good  prep.  The reasoning behind this has been discussed with him and his  wife.  The risks, benefits and limitations have been reviewed.   PROCEDURE NOTE:  O2 saturation, blood pressure, pulse, and respirations  were monitored throughout the entire procedure.   MEDICATION:  Conscious sedation with Versed 4 mg IV, Demerol 75 mg IV in  divided doses.   INSTRUMENT:  Pentax video chip system.   FINDINGS:  Digital rectal exam revealed no abnormalities.  UNFORTUNATELY, THERE WAS A LARGE AMOUNT OF FORMED STOOL ELEMENTS IN THE  RECTUM TRAILING UP INTO THE SIGMOID WHICH PRECLUDED COLONOSCOPY TODAY.   By the patient's own admission, he tells me he did not drink his prep as  instructed.   IMPRESSION:  Incomplete colonoscopy, inadequate colon preparation,  patient noncompliance with preparation hindering our evaluation.   RECOMMENDATIONS:  1. Will discuss this issue with Mr. and Mrs. Copley.  The need to get      to the bottom  of his iron-deficiency, Hemoccult-positive stool will      be emphasized and if he is interested, we will make an another      attempt at      performing colonoscopy.  2. Hepatic vein wedge pressure measurements and transjugular liver      biopsy are pending once we have completed his GI evaluation for      iron-deficiency anemia, Hemoccult positive stool.      [name], M.D.  Electronically Signed     RMR/MEDQ  D:  04/26/2009  T:  04/26/2009  Job:  [id number]   cc:   Erle Crocker, M.D.

## 2011-04-08 NOTE — Discharge Summary (Signed)
NAME:  Kyle [name], Kyle [name] NO.:  [id number]   MEDICAL RECORD NO.:  [id number]          PATIENT TYPE:  INP   LOCATION:  A335                          FACILITY:  [facility]   PHYSICIAN:  Dorris Singh, DO    DATE OF BIRTH:  1956-11-06   DATE OF ADMISSION:  02/28/2009  DATE OF DISCHARGE:  04/09/2010LH                               DISCHARGE SUMMARY   PRIMARY CARE PHYSICIAN:  Erle Crocker, MD   CONSULTATIONS:  1. GI.  2. Dr. Mariel Sleet.  3. [facility]   His radiology testing includes a two-view chest x-ray which demonstrates  cardiomegaly with slight pulmonary vascular congestion, low lung volumes  without acute infiltrate.  His H and P was done by Dr. Elige Radon.   SUMMARIZE:  The patient is a 54 year old Caucasian male who was sent in  by his primary care physician for the above.   ADMITTING DIAGNOSES:  1. Pulmonary congestion.  2. Chest pain.  3. Anemia.  4. Shortness of breath.  5. Claudication.  6. Adrenal tumor.   DISCHARGE DIAGNOSES:  1. Exertional chest pain and shortness of breath.  2. Lower leg edema.  3. Diabetes mellitus.  4. Hypertension.  5. Hyperlipidemia.  6. Sleep apnea.  7. Tobacco abuse.  8. Bladder cancer, status post resection.  9. Adrenal tumor.  10.Microcytic anemia with thrombocytopenia.  11.Splenomegaly.  12.B12 deficiency.  13.Restless leg syndrome.  14.Gastroesophageal reflux disease.  15.Peripheral neuropathy.  16.Depression.   HOSPITAL COURSE:  The patient is a 55 year old Caucasian male who  presented to [facility] after being seen by his primary care  physician.  He was directly admitted to the service of [facility] for  exertional shortness of breath and the above diagnoses.  Cardiology and  Dr. Mariel Sleet and GI were consulted.  Cardiology saw him as there was a  concern that this could be possibly due to his heart, the patient was  scheduled for exercise stress test which was attempted, it was stopped  with heart rate greater than 100 with WL 5 METs and dyspnea.  So, he  will come back for the second part of his stress test.  Also, Dr.  Mariel Sleet saw the patient and recommended some testing for him, he was  concerned about splenomegaly, especially with his history of bladder  cancer, so he will go ahead and follow up with him outpatient.  Dr.  Cira Servant saw him, there was some concern with his anemia.  He was stool  positive and they recommend that he take iron pills and he can follow up  outpatient as well.  After he was in about a month for his  thrombocytopenia, there was some concern about portal hypertension and  cirrhosis, burned-out [name], that was a concern from the GI standpoint.  The patient had an echocardiogram done today and a stress test as  mentioned before and on comparison, there was no significant interval  change from November 23, so please refer to that study.  At this point  in time, Cardiology felt that he could be discharged to home after  speaking with Dr.  Rothbart and also GI felt that he could be seen  outpatient, and Dr. Mariel Sleet will see him outpatient.  So, his  medications that he will be sent home on include;  1. Baby aspirin 81 mg daily.  2. Fish oil 1000 mg tabs twice a day.  3. Spironolactone 25 mg daily.  4. Mirapex 0.5 mg at bedtime.  5. Avandia 8 mg daily.  6. Trazodone 100 mg at bedtime.  7. Iron twice daily.  8. Proventil inhaler use q.i.d. p.r.n. shortness of breath.  9. Lisinopril 40 mg p.o. daily.  10.Omeprazole 40 mg p.o. daily.  11.Metformin 1000 mg twice a day.  12.Atenolol 100 mg daily.  13.Simvastatin 20 mg daily.  14.Gabapentin 300 mg 2 tablets nightly.  15.Furosemide 40 mg daily.  16.Glipizide 10 mg twice a day.  17.Klor-Con 10 mEq daily.  18.Viagra 100 mg as needed.  19.NovoLog 70/30, 90 units for breakfast and NovoLog 70/30, 70 units      at night.   DISCHARGE INSTRUCTIONS:  1. He is to increase his activity slowly.  He is to be  on a low-sodium      diabetic diet.  He is to stop smoking heavily.  His Cardiology is      not seeing any cardiac source of this shortness of breath and it      might be due to his 25-pack-year plus history of smoking.  This      might be a contributory factor also with the anemia as well.  This      may be multifactorial.  He was recommended to follow up with      [facility] at the beginning of the week.  Also, we will set him up      with the Radiology for the resting portion of his stress test.  He      was recommended to follow up with Dr. Cira Servant in about 2-4 weeks.  We      will give him the number.  In between this, he can follow up with      his      primary care physician in the next 1-2 weeks, and Dr. Mariel Sleet has      instructed him to follow up with him next week as well as [facility].  His condition is stable and his disposition will be to      home.   I spent greater than 30 minutes on this report.      Dorris Singh, DO  Electronically Signed     CB/MEDQ  D:  03/02/2009  T:  03/03/2009  Job:  [id number]

## 2011-04-08 NOTE — Discharge Summary (Signed)
NAME:  Kyle [name], Kyle [name] NO.:  [id number]   MEDICAL RECORD NO.:  [id number]          PATIENT TYPE:  INP   LOCATION:  1445                         [facility]   PHYSICIAN:  Heloise Purpura, MD      DATE OF BIRTH:  March 21, 1956   DATE OF ADMISSION:  06/04/2009  DATE OF DISCHARGE:  06/06/2009                               DISCHARGE SUMMARY   ADMISSION DIAGNOSES:  1. Left adrenal mass.  2. Anemia.  3. History of urothelial carcinoma.   DISCHARGE DIAGNOSES:  1. Left adrenal mass.  2. Anemia.  3. History of urothelial carcinoma.  4. Adrenal cortical adenoma.  5. Hyperkalemia.   HISTORY AND PHYSICAL:  For full details please see admission history and  physical.  Briefly, [name] is a 55 year old gentleman who was  incidentally found to have a left adrenal mass measuring 5 cm.  All  imaging characteristics were consistent with a diagnosis of an adrenal  adenoma most likely, based on size criteria, he was at increased risk  for possible adrenal malignancy.  He did not have any evidence for  metastatic disease and after undergoing a thorough preoperative medical  evaluation which included evaluation of a GI bleed, workup and treatment  for anemia, and evaluation of splenomegaly, he was felt to be medically  optimized to proceed with adrenalectomy and did wish to pursue a  laparoscopic adrenalectomy for a definitive diagnosis and possible  therapeutic procedure.   HOSPITAL COURSE:  On June 04, 2009, the patient was taken to the  operating room and underwent a left laparoscopic adrenalectomy.  He  tolerated this procedure well without complications.  Postoperatively,  he was able to be transferred to a regular hospital room following  recovery from anesthesia.  He was able to begin ambulating the night of  surgery.  His diet was gradually advanced to liquids.  He remained  hemodynamically stable without evidence for hypotension.  On  postoperative day #1, he was  noted to have a decrease in his hemoglobin  from 12.3 to 10.7.  This was felt to likely be dilutional.  However, he  was maintained on DVT prophylaxis with subcutaneous heparin and,  therefore, his hemoglobin levels were carefully followed to ensure no  active bleeding.  A repeat hemoglobin later that day was 10.9 and it was  found to be 11.2 the following morning.  In addition, he was noted to be  mildly hyperkalemic throughout his entire hospitalization with a  potassium of 5.3 postoperatively and this remained stable.  His serum  creatinine did slightly increased postoperatively but then decreased and  was 1.2 prior to discharge.  Based on the fact that he did have some  persistent hyperkalemia, it was recommended that he stop his  spironolactone and potassium supplement until he followed up in the  office to repeat his laboratory studies.  On postoperative day #2, he  had return of bowel function was tolerating a regular diabetic diet.  His blood glucose levels remained relatively well controlled throughout  his hospitalization.  He was felt to be stable for discharge  and was  discharged home.   DISPOSITION:  Home.   DISCHARGE MEDICATIONS:  He was instructed to resume his regular home  medications excepting for spironolactone and his potassium supplement.  He was given a prescription to take hydrocodone as needed for pain  control and Colace as a stool softener.   DISCHARGE INSTRUCTIONS:  He was instructed to be ambulatory but  specifically told to refrain from any heavy lifting, strenuous activity,  or driving.  He was instructed to resume his regular diet and to resume  his regular scheduled insulin.   FOLLOW UP:  He will follow up in the next 7-10 days for further  postoperative evaluation and to repeat his laboratory studies.  He will  also require followup with Dr. Mariel Sleet for continued management of his  chronic anemia and Dr. Erle Crocker for continued primary  care.   PATHOLOGY:  His pathology did return during his hospitalization and did  demonstrate a benign adrenal cortical adenoma without features  concerning for malignancy on microscopic exam.  I did discuss this  result with him as well as its implications during his hospitalization.      Heloise Purpura, MD  Electronically Signed     LB/MEDQ  D:  06/06/2009  T:  06/06/2009  Job:  [id number]   cc:   Ladona Horns. Mariel Sleet, MD  Fax: [phone]   Erle Crocker, M.D.

## 2011-04-08 NOTE — Op Note (Signed)
NAME:  BENFORD, ASCH NO.:  [id number]   MEDICAL RECORD NO.:  [id number]          PATIENT TYPE:  AMB   LOCATION:  DAY                           FACILITY:  [facility]   PHYSICIAN:  R. Roetta Sessions, M.D. DATE OF BIRTH:  1956-09-02   DATE OF PROCEDURE:  DATE OF DISCHARGE:  04/12/2009                               OPERATIVE REPORT   INDICATIONS FOR PROCEDURE:  A 55 year old gentleman with iron-deficiency  anemia, Hemoccult-positive stool and also has B12 deficiency.  Colonoscopy in 2008 demonstrated pan colonic diverticula only.  An EGD  now being done and if EGD unremarkable, deploy Given capsule study of  the small bowel.  As a separate issue is a question of underlying  chronic liver disease.  Unless EGD demonstrates compelling evidence for  portal gastropathy, I have recommended that he be referred for hepatic  vein wedge pressure measurements and a transjugular liver biopsy at the  same time.  Today's approach has been discussed Mr. Galen at length in  the office last week, talked with his wife as well.  Risks, benefits,  alternatives and limitations have been discussed.  He is agreeable.   PROCEDURE NOTE:  Oxygen saturation, blood pressure, pulse, respirations  monitored the entire procedure.  Conscious sedation with Versed 4 mg IV  and Demerol 75 mg IV in divided doses.  Cetacaine spray for topical  pharyngeal anesthesia.  Instrument was the Pentax video chip system.   FINDINGS:  The examination of the tubular esophagus revealed entirely  normal mucosa.  There were no varices.  The EG junction easily  traversed.  Stomach:  Gas cavity was empty, insufflated well with air.  A thorough  gastric mucosa including retroflexed proximal stomach esophagogastric  junction demonstrated only a small hiatal hernia.  Gastric mucosa  appeared normal.  There is no evidence of portal gastropathy or other  abnormality.  Pylorus patent, easily traversed and the bulb, second and  third portions revealed no mucosal abnormalities.   THERAPEUTIC/DIAGNOSTIC MANEUVERS PERFORMED:  The scope was withdrawn.  The Given capsule deployment device was attached and a capsule was  loaded up.  The scope was reintroduced into the esophagus and it was  advanced easily into the stomach, across the pylorus.  This capsule was  then deployed into the second portion of the duodenum without difficulty  or apparent complication.  The patient tolerated the procedure well and  was reactive to endoscopy.   IMPRESSION:  1. Normal esophagus.  2. Small hiatal hernia.  3. Otherwise normal stomach.  4. Normal D1 through D3 (no stigmata of  chronic liver disease seen      anywhere in his upper GI tract).  5. 5Status post capsule deployment to the small bowel.   RECOMMENDATIONS:  Will review small bowel images when they become  available and will likely make the subsequent referral to the  interventional radiologist in [facility] for the above-mentioned studies  regarding his liver.      [name], M.D.  Electronically Signed     RMR/MEDQ  D:  04/12/2009  T:  04/12/2009  Job:  [id number]   cc:   Heloise Purpura, MD  Fax: [phone]   Ladona Horns. Mariel Sleet, MD  Fax: [phone]   Erle Crocker, M.D.

## 2011-04-08 NOTE — Letter (Signed)
January 08, 2009    Erle Crocker, MD  [phone] [address]  [place], [place] [address]   RE:  Kyle [name], Kyle [name]  MRN:  [id number]  /  DOB:  Feb 25, 1956   Dear [name],   Mr. Bethel returns to the office for continuing assessment and treatment  of syncope.  Since the last visit, he has done generally well.  He is  tolerating a positive pressure mask at night for his sleep apnea, albeit  with some difficulty.  His wife reports that his snoring has completely  resolved.  He feels somewhat better in the daytime.  He recently has had  URI symptoms and is not feeling particularly well at the present time.  Control of diabetes has been gradually improving.  Blood pressure has  been good.  His last lipid profile was quite good.   Mr. Dusza did experience one episode of loss consciousness.  This  occurred at night when he developed a paroxysmal cough and could not  rapidly remove his face mask.  He fell off the bed, striking his head  against the corner of a bed table, but did not appear to sustain a  significant injury.   MEDICATIONS:  Unchanged from his last visit except for modification of  his insulin regime.   PHYSICAL EXAMINATION:  GENERAL:  Overweight, pleasant gentleman in no  acute distress.  VITAL SIGNS:  The weight is 259, 3 pounds more than in December.  Blood  pressure 115/75, heart rate 85 and regular, and respirations 14.  NECK:  No jugular venous distention; normal carotid upstrokes without  bruits.  LUNGS:  Clear.  CARDIAC:  Normal first and second heart sounds; minus systolic ejection  murmur.  ABDOMEN:  Soft and nontender; no organomegaly.  EXTREMITIES:  Trace edema.   Event recorder tracings were reviewed.  A number of strips were sent  automatically.  Each showed either sinus rhythm or sinus tachycardia.  No symptoms were reported.  No arrhythmias were documented.   IMPRESSION:  Mr. Kuenzel is doing well with treatment for obstructive  sleep apnea.  I did  not review his periodic leg movements with him or  determine whether this is continuing to be a problem.  He might benefit  from benzodiazepines or other hypnotics to assist with mask use at  night.  Mr. Eads has had one recurrent episode of syncope consistent  with cough-induced loss of consciousness.  I described that entity to  him and advised him that he should remain seated or lying down when he  develops cough and be certain with loss of consciousness would not  result in place intermittent compromised position.  He will call if he  develops additional episodes of syncope or other problems, which I can  assist.  Otherwise, I will reevaluate this nice gentleman in 6 months.    Sincerely,      Gerrit Friends. Dietrich Pates, MD, [facility]  Electronically Signed    RMR/MedQ  DD: 01/08/2009  DT: 01/09/2009  Job #: [id number]

## 2011-04-08 NOTE — Op Note (Signed)
NAME:  [name], [name] NO.:  [id number]   MEDICAL RECORD NO.:  [id number]          PATIENT TYPE:  INP   LOCATION:  A320                          FACILITY:  [facility]   PHYSICIAN:  Kyle [name], M.D.      DATE OF BIRTH:  20-Feb-1956   DATE OF PROCEDURE:  05/09/2009  DATE OF DISCHARGE:                               OPERATIVE REPORT   REFERRING Kyle [name]:  Kyle [name], M.D.   PRIMARY PHYSICIAN:  Kyle [name], M.D.   PRIMARY GASTROENTEROLOGY:  Kyle [name], M.D.   PROCEDURE:  Colonoscopy with cold forceps polypectomy.   INDICATION FOR EXAM:  Kyle [name] is a 55 year old male who presents with  painless rectal bleeding.   FINDINGS:  1. Rare sigmoid colon diverticula.  Otherwise, no polyps, masses,      inflammatory changes, diverticular AVMs.  2. Moderate internal hemorrhoids.  3. No rectal varices.  4. No old blood or fresh blood seen in the colon or the rectum.  5. One 4 mm sessile rectal polyp removed via cold forceps.   DIAGNOSIS:  Painless rectal bleeding likely secondary to hemorrhoids.   RECOMMENDATIONS:  1. Screening colonoscopy in 10 years.  2. He should continue his evaluation for his cirrhosis.  His liver      biopsy showed steatohepatitis with cirrhosis.  3. Advance to soft, medium carbohydrate diet.  4. Anusol HC 1 per rectum every 12 hours for 14 days.  5. He has a followup appointment to see Dr. Jena [name] on July 2nd.   MEDICATIONS:  Propofol provided by Anesthesia.   PROCEDURE TECHNIQUE:  Physical exam was performed.  Informed consent was  obtained from the patient after explaining the benefits, risks, and  alternatives to the procedure.  The patient was connected to the monitor  and placed in the left lateral position.  Continuous oxygen was provided  by nasal cannula, IV medicine administered through an indwelling  cannula.  After administration of sedation and rectal exam, the  patient's rectum was intubated and the scope was advanced  under direct  visualization to the cecum.  The scope was removed slowly by carefully  examining the color, texture, anatomy, and integrity of the mucosa on  the way out.  The patient was recovered in Endoscopy and discharged to  the floor in satisfactory condition.   PATH:  hYPERPLASTIC POLYPS      Kyle [name], M.D.  Electronically Signed     SM/MEDQ  D:  05/09/2009  T:  05/09/2009  Job:  [id number]   cc:   Kyle [name], M.D.  Fax: [phone]

## 2011-04-08 NOTE — Consult Note (Signed)
NAME:  Kyle [name], Kyle [name] NO.:  [id number]   MEDICAL RECORD NO.:  [id number]          PATIENT TYPE:  INP   LOCATION:  A335                          FACILITY:  [facility]   PHYSICIAN:  Kassie Mends, M.D.      DATE OF BIRTH:  [birth year]   DATE OF CONSULTATION:  03/02/2009  DATE OF DISCHARGE:                                 CONSULTATION   REASON FOR CONSULTATION:  ? cirrhosis, anemia, heme-positive stool.   HISTORY OF PRESENT ILLNESS:  The patient is a 55 year old gentleman, a  patient of Dr. Corinne Ports, who is a direct admission for dyspnea  on exertion, edema, anemia and chest pain.  The patient ruled out for a  myocardial infarction.  He was seen by cardiology.  He does not feel his  symptoms are related to cardiac in etiology.  Dr. Ladona Horns. Neijstrom was  consulted for his opinion about an adrenal mass that the patient is  actually scheduled to have removed at the end of the month by Dr. Heloise Purpura.  It was recommended by cardiology that the patient have a stress  Myoview, but he cannot have that done right now because of shortages in  the nuclear medicine materials.  Dr. Mariel Sleet had called Dr. [name] today, asking that we see the patient regarding the possibility of  cirrhosis.  He had reported a micronodular-appearing liver on imaging  studies and was concerned about anemia and heme-positive stool.   The patient had labs consistent with iron deficiency anemia and a B12  deficiency.  He has had two to there Hemoccults which were positive.  He  has no reported melena or bright red blood per rectum.  The patient did  have an MR of the abdomen without contrast on 01/01/2009, to follow up  an adrenal mass.  This showed mild fatty liver.  The spleen was  enlarged, measuring 18 cm.  A CT on 12/21/2008, with and without  contrast again showed splenomegaly and diffuse fatty infiltration of the  liver.   The patient states that he has been fatigued.  When  he exerts himself,  he gets short of breath.  Over the past several weeks, his lower  extremities have become more edematous.  This is what lead to his  presentation.  He has also had some chest discomfort.  He states he is  feeling much better today.  He hopes he is going  home.  He has some  vague lower suprapubic abdominal pain, but states he has had multiple  urinary tract infections since his transurethral resection of a low-  grade bladder tumor earlier in the year.  The surgery was on 01/22/2009.  He had postoperative intravesical installation of Mitomycin-C as well.  He denies any nausea or vomiting.  He states he has had a steady weight  gain over the last several months.  He believes this is related to fluid  retention.  No prior ascites seen on studies.  He does have heartburn,  which is reasonably well-controlled on Omeprazole.  He denies any  dysphagia, odynophagia, nausea or vomiting.   HOME MEDICATIONS:  1. Aspirin 81 mg daily.  2. Avandia 8 mg daily.  3. Trazodone 100 mg q.h.s.  4. Lisinopril 40 mg daily.  5. Omeprazole 40 mg daily.  6. Metformin 1000 mg b.i.d.  7. Atenolol 100 mg daily.  8. Simvastatin 20 mg daily.  9. Neurontin 600 mg q.h.s.  10.Lasix 40 mg daily.  11.Glipizide 10 mg b.i.d.  12.Potassium chloride 10 mEq daily.  13.Viagra 100 mg p.r.n.  14.NovoLog 70/30, 90 units before breakfast and 70 units for the      evening meal.   ALLERGIES:  No known drug allergies.   PAST MEDICAL HISTORY:  1. Diabetes mellitus.  2. Hypertension.  3. Hyperlipidemia.  4. Depression.  5. Peripheral neuropathy.  6. Gastroesophageal reflux disease.  7. Lower extremity edema.  8. Restless leg syndrome.  9. Sleep apnea.  10.Chronic obstructive pulmonary disease.  11.Thrombocytopenia.  12.Splenomegaly.  13.B12 deficiency.  14.Iron deficiency anemia.  15.History of bladder/low-grade papillary carcinoma, status post      transurethral resection on 01/22/2009.  16.He  also has an adrenal mass which is being followed by Dr. Laverle Patter.      Recommendations have been made by him to have this removed.  Based      on the MR study, it appears to be benign, but is a large lesion      measuring 4.5 cm to 5 cm.  17.He had a prior EGD and colonoscopy in October 2008, by Dr. Jena Gauss,      which revealed a 3 cm tongue of salmon-colored epithelium in the      distal esophagus, biopsy not consistent with Barrett's, small      hiatal hernia, friable anal canal hemorrhoids, pancolonic      diverticulosis.   FAMILY HISTORY:  Father deceased in his 61's due to alcoholic cirrhosis.  Mother deceased due to kidney cancer.  He has a  twin brother who has  HIV and diabetes.   SOCIAL HISTORY:  He lives with his wife.  He has three children.  He  smokes 1-1/2 packs of cigarettes daily.  Denies any alcohol use.  He  states he did drink rather regularly, one to two pints a day back in his  teenage years and early 20's.  Denies any drug use history.   REVIEW OF SYSTEMS:  GI:  See the HPI.  CONSTITUTIONAL:  See the HPI.  CARDIOPULMONARY:  See the HPI.  GENITOURINARY:  Denies dysuria or hematuria.   PHYSICAL EXAMINATION:  VITAL SIGNS:  Temperature 98.5 degrees, pulse 95,  respirations 20, blood pressure 138/84, weight 115.7 kg, height 67  inches.  GENERAL:  Pleasant, obese Caucasian gentleman, in no acute distress.  SKIN:  Warm and dry.  No jaundice.  HEENT:  Sclerae anicteric.  Oropharyngeal mucosa moist and pink.  No  lesions, erythema or exudates.  NECK:  No lymphadenopathy or  thyromegaly.  CHEST:  Lungs clear to auscultation.  CARDIAC:  A regular rate and rhythm.  ABDOMEN:  Obese, positive bowel sounds, soft, nontender.  I did not  appreciate splenomegaly or masses, but limited due to body habitus.  No  rebound, no guarding.  EXTREMITIES:  Lower extremities:  With 1 to 2+ pitting edema to the mid-  calf bilaterally.   LABORATORY DATA:  White count 6500, hemoglobin 9.4.   On 02/07/2009, his  hemoglobin was 9.8.  In March 2009, hemoglobin was 13.5.  His MCV is  slightly low at  75.9, platelet  count slightly low at 136,000.  Sodium  138, potassium 5.2, BUN 15, creatinine 1.22, glucose 164.  Total  bilirubin 0.2, alkaline phosphatase 44, AST 49, ALT 33, albumin 3.5.  HIV is nonreactive.  Iron is low at 21.  Iron saturation is low at 5%,  TIBC high at 455, ferritin low at 10, B12 low at 158, folate greater  than 24.  Homocysteine level 11.4.  Urinalysis showed large blood.  His  TSH level was normal.   Chest x-ray showed cardiomegaly with slight pulmonary vascular  congestion.  MR and CT of the abdomen as outlined above.   IMPRESSION:  1. The patient is a 55 year old gentleman admitted with shortness of      breath, chest pain with pulmonary vascular congestion on chest x-      ray and lower extremity edema.  2. He was found to be anemic as well.  He has iron deficiency anemia      and B12 deficiency.  Hemoglobin was normal one year ago.  His stool      is heme-positive.  Recent endoscopy and colonoscopy as outlined      above.  He is on aspirin one daily.  He denies any other NSAID's.      He may have an occult GI bleed from the upper GI tract or small      bowel due to erosions or arteriovenous malformations.  Agree with      the need to rule out celiac disease as well.  Unlikely we are      dealing with colon cancer, given the recent colonoscopy.  Likely      will offer him upper endoscopy plus or minus small bowel biopsies      and small bowel capsular endoscopy, based on findings.  3. The other concern is the splenomegaly, thrombocytopenia with      reported fatty infiltration of the liver on two imaging studies.      The albumin is low normal at 3.5.  He has a remote history of      excessive alcohol use for a few years duration.  Would check PT and      INR if not already done, to determine the hepatic function.  Would      also follow up on pending  viral markers, given the elevated      transaminase.   I have discussed this case with Dr. Kassie Mends.  With no evidence of  cirrhosis on prior imaging studies, she would recommend hepatic wedge  pressure done at the [facility] through  interventional radiology, to determine whether he has portal  hypertension as a cause of his splenomegaly.   The patient desires going home.  Given that he has a stable hemoglobin,  we could pursue outpatient evaluation.  Will arrange for an EGD with  possible small bowel capsule endoscopy in the near future with Dr.  Jena Gauss.      Tana Coast, P.A.      Kassie Mends, M.D.  Electronically Signed    LL/MEDQ  D:  03/02/2009  T:  03/02/2009  Job:  [id number]   cc:   Erle Crocker, M.D.

## 2011-04-08 NOTE — Consult Note (Signed)
NAME:  SUMEET, GETER NO.:  [id number]   MEDICAL RECORD NO.:  [id number]         PATIENT TYPE:  AMB   LOCATION:  DAY                           FACILITY:  [facility]   PHYSICIAN:  R. Roetta Sessions, M.D. DATE OF BIRTH:  03/06/1956   DATE OF CONSULTATION:  DATE OF DISCHARGE:                                 CONSULTATION   REQUESTING PHYSICIAN:  [facility], Dr. Loney Hering.   REASON FOR CONSULTATION:  Rectal bleeding.   HISTORY OF PRESENT ILLNESS:  Mr. Tibbetts is a 55 year old Caucasian male.  He describes a history of large volume bright-red rectal bleeding about  4 weeks ago with stooling.  He noticed the blood in significant amounts  in the water as well as in the stool.  This occurred on at least 5-6  occasions.  He denied any abdominal pain with the bleeding.  Denied any  proctalgia or rectal pruritus.  He denies any history of anemia.  Denies  any nausea, vomiting, diarrhea or constipation.  He has history of  chronic GERD with symptoms well controlled on Prevacid 30 mg daily.  He  has had heartburn and ingestion for at least 6 years now.  Denies any  dysphagia, odynophagia.  He has been on aspirin 81 mg for preventative  measures for years.   PAST MEDICAL AND SURGICAL HISTORY:  Diabetes mellitus, hypertension,  hypercholesterolemia and GERD, restless leg syndrome and arthritis.   CURRENT MEDICATIONS:  1. Fish oil 1 g b.i.d.  2. Aspirin 81 mg daily.  3. Multivitamin daily.  4. Glucophage XR 1 g b.i.d.  5. Avalide 300/12.5 mg daily.  6. Prevacid 30 mg daily.  7. Tiazac and 180 mg daily.  8. Atenolol 50 mg daily.  9. Lisinopril 20 mg daily.  10.Vytorin 10/20 mg daily.  11.Viagra 5/100 mg p.r.n.  12.Levemir 36 units at 11 p.m.  13.NovoLog 11 units at 11:30 a.m. and 11 units at 5:00 p.m.   ALLERGIES:  NO KNOWN DRUG ALLERGIES.   FAMILY HISTORY:  No known family history of __________ colorectal  carcinoma.  His father did have cirrhosis with and died at age  82.  Mother, age 20, has diabetes mellitus and renal carcinoma.  He has 1  twin brother who has cardiovascular disease.   SOCIAL HISTORY:  Mr. Shouse is married.  He has 3 sons.  He is disabled.  He has a 25 pack/year history of tobacco use.  Denies any alcohol or  drug use.   REVIEW OF SYSTEMS:  See HPI, otherwise negative.   PHYSICAL EXAMINATION:  VITAL SIGNS:  Weight 239 pounds, height 67  inches, temp 98.1, blood pressure 120/98, pulse 64.  GENERAL:  Mr. Swoboda is a well-developed, well-nourished Caucasian male  in no acute distress.  HEENT:  Sclerae are clear and nonicteric, conjunctiva pink.  Oropharynx  pink and moist without lesions.  NECK:  Supple without mass or thyromegaly.  CHEST:  Heart regular rate and rhythm.  Normal S1, s2.  No murmurs,  clicks, rubs or gallops.  LUNGS:  Clear to auscultation bilaterally.  ABDOMEN:  Protuberant with  positive bowel sounds x4.  No bruits  auscultated.  Soft, nontender, nondistended; without palpable mass,  hepatosplenomegaly, rebound tenderness or guarding.  EXTREMITIES:  Without clubbing or edema bilaterally.  SKIN:  Pink, warm, and dry without rash or jaundice.   IMPRESSION:  Ms. Laforge is a 55 year old Caucasian male with several  episodes of rectal bleeding.  He is going to require further evaluation  to rule out colorectal carcinoma, however, this could be related to  benign anorectal source including hemorrhoids or diverticula.   He has history of chronic GERD, well controlled on PPI.  He is a  Caucasian male at risk for of Barrett esophagus, and is wanting to  pursue screening for this at the same time.   PLAN:  1. Colonoscopy and screening EGD with Dr. Jena Gauss in the near future.  I      discussed both procedures including risks and benefits, including      but not limited to infection, perforation, drug reaction.  He      agrees with plan.  Consent will be obtained.  2. He is to take half of his insulin and diabetes  medications the date      prior to and of the procedures.  He is to hold his aspirin for 3      days prior to the exam.   We would like to thank Dr. Loney Hering for allowing [place] to participate in the  care of Mr. Sinclair.      Lorenza Burton, N.P.      [name], M.D.  Electronically Signed    KJ/MEDQ  D:  09/06/2007  T:  09/07/2007  Job:  [id number]

## 2011-04-08 NOTE — Discharge Summary (Signed)
NAME:  [name], [name] NO.:  [id number]   MEDICAL RECORD NO.:  [id number]          PATIENT TYPE:  OIB   LOCATION:  1437                         [facility]ACILITY[facility]  [facility]   PHYSICIAN:  Heloise Purpura, MD      DATE OF BIRTH:  07-05-1956   DATE OF ADMISSION:  01/22/2009  DATE OF DISCHARGE:  01/23/2009                               DISCHARGE SUMMARY   ADMISSION DIAGNOSIS:  Bladder tumor.   DISCHARGE DIAGNOSIS:  Bladder tumor.   HISTORY AND PHYSICAL:  For full details, please see admission history  and physical.  Briefly, Mr. Kyle [name] is a 55 year old gentleman who was  incidentally noted to have a bladder tumor on ultrasonography.  He  underwent office cystoscopy which demonstrated the bladder tumor.  He  was therefore counseled regarding options and it was recommended that he  proceed with cystoscopy and transurethral resection.  On January 22, 2009,  he was taken to the operating room and underwent cystoscopy and a  transurethral resection of his bladder tumor, as well as bilateral  retrograde pyelography.  He tolerated this procedure well without  complications.  He was administered postoperative mitomycin C in the  recovery unit and was transferred to a regular hospital room.  He was  observed overnight due to the large bladder tumor resection and a Foley  catheter was left indwelling.  This was removed the following morning  and he was able to void without difficulty.  He was felt ready for  discharge on the morning of postoperative day #1.   DISPOSITION:  Home.   DISCHARGE MEDICATIONS:  He was given a prescription to take Vicodin as  needed for pain and Cipro as an antibiotic.  He was instructed otherwise  to resume his regular home medications, except for aspirin, nonsteroidal  anti-inflammatory drugs, or herbal supplements.   DISCHARGE INSTRUCTIONS:  He was instructed to be ambulatory, but told to  refrain from any heavy lifting or strenuous activity.   FOLLOW UP:   He will follow-up in the next 2-3 weeks to discuss his  surgical pathology report.      Heloise Purpura, MD  Electronically Signed     LB/MEDQ  D:  01/24/2009  T:  01/25/2009  Job:  [id number]

## 2011-04-08 NOTE — Procedures (Signed)
NAME:  Kyle [name], Kyle [name] NO.:  [id number]   MEDICAL RECORD NO.:  [id number]          PATIENT TYPE:  OUT   LOCATION:  SLEEP CENTER                 FACILITY:  [facility]   PHYSICIAN:  Barbaraann Share, MD,FCCPDATE OF BIRTH:  07-19-1956   DATE OF STUDY:  11/02/2008                            NOCTURNAL POLYSOMNOGRAM   REFERRING PHYSICIAN:   REFERRING PHYSICIAN:  Erle Crocker, MD   INDICATION FOR STUDY:  Hypersomnia with sleep apnea.  The patient  returns for pressure optimization.   EPWORTH SLEEPINESS SCORE:  10.   SLEEP ARCHITECTURE:  The patient had a total sleep time of 246 minutes  with very little slow wave sleep or REM.  Sleep onset latency was normal  at 12 minutes and REM onset was normal as well at 93 minutes.  Sleep  efficiency was significantly decreased at 69%.   RESPIRATORY DATA:  The patient underwent a formal titration study using  a large ResMed Quattro Full Face Mask.  The patient had to trim facial  hair to some degree in order to get an adequate seal.  He was initially  started on CPAP, however, could not tolerate a pressure of 7 cm of water  due to an inability to exhale.  He was subsequently changed to bilevel,  and gradually his pressure was increased to treat both snoring and  obstructive events.  The patient was titrated to a final level of 15/11,  but did have a few breakthrough events on this pressure.  I would  recommend a treatment bilevel pressure of 15 cm on the inspiratory side,  and 12 cm on the expiratory side.   OXYGEN DATA:  The patient had significant oxygen desaturation even with  bilevel.  Supplemental oxygen was added first at 1 L and gradually  increased to 3 L in order to achieve oxygen saturations greater than 88%  even on optimal BiPAP.   CARDIAC DATA:  No clinically significant arrhythmias were seen.   MOVEMENT-PARASOMNIA:  The patient was found to have 211 periodic leg  movements with 4 per hour resulting in arousal or  awakening.  These are  more than likely related to the patient's sleep-disordered breathing.   IMPRESSIONS-RECOMMENDATIONS:  1. Good control of previously documented obstructive sleep apnea with      bilevel at an inspiratory pressure of 15 cm, and expiratory      pressure of 12 cm.  This was delivered by a large Quattro Full Face      Mask, however, the patient must keep his facial hair trimmed in      order to prevent significant leaks.  2. Significant oxygen desaturation even on bilevel which required 3 L      of supplemental oxygen through the machine.  He will require this      as an outpatient.  3. The patient was found to have significant numbers of periodic leg      movements with definite sleep disruption.  These are more than      likely related to the patient's sleep-disordered breathing;      however, would consider a      primary  movement disorder of sleep if he continues to have symptoms      despite adequate treatment of his sleep-disordered breathing.      Clinical correlation is suggested.      Barbaraann Share, MD,FCCP  Diplomate, American Board of Sleep  Medicine  Electronically Signed     KMC/MEDQ  D:  11/07/2008 16:35:47  T:  11/08/2008 11:09:14  Job:  [id number]

## 2011-04-08 NOTE — Consult Note (Signed)
NAME:  Kyle [name], Kyle [name] NO.:  [id number]   MEDICAL RECORD NO.:  [id number]          PATIENT TYPE:  INP   LOCATION:  A335                          FACILITY:  [facility]   PHYSICIAN:  Thomas C. Wall, MD, FACCDATE OF BIRTH:  [birth year]   DATE OF CONSULTATION:  03/01/2009  DATE OF DISCHARGE:                                 CONSULTATION   PRIMARY CARE PHYSICIAN:  Erle Crocker, M.D.   CARDIOLOGIST:  Gerrit Friends. Dietrich Pates, M.D., [facility]   REASON FOR CONSULTATION:  Chest pain, shortness of breath.   HISTORY OF PRESENT ILLNESS:  [name] is a 55 year old male with  diabetes, hypertension, hyperlipidemia who is also a smoker who was  evaluated by Dr. Dietrich Pates in November 2009 for syncope.  Workup was  fairly unremarkable, and the last note from Dr. Dietrich Pates indicates he  had possible cough-induced syncope.  Since then, the patient was  diagnosed with a bladder tumor and underwent resection at [facility] with Dr. Laverle Patter last month.  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  Followup CT scan demonstrated possible left  adrenal mass, and a followup MRI demonstrated findings consistent with a  benign adenoma.  His surgeon was apparently trying to convince him to  have this removed.  Currently he is not sure if that will be done.  He  did present to [facility] several weeks ago with chest pain and  was initially diagnosed with bronchitis.  Chest CT angiogram at that  time demonstrated no pulmonary embolism but did show mild aneurysmal  dilatation of the ascending thoracic aorta.  He presented to his primary  care physician yesterday with complaints of increased lower extremity  edema and also noted increased dyspnea with exertion over the last 3  months, now with associated head and chest pressure for the last 2  weeks.  He denies radiating symptoms.  He denies nausea or diaphoresis.  He denies syncope.  He does note that he is sleeps on 2 pillows  but this  is chronic for years without change.  He denies paroxysmal nocturnal  dyspnea.  We are now asked to further evaluate.   PAST MEDICAL HISTORY:  1. Diabetes mellitus.  2. Hypertension.  3. Hyperlipidemia.  4. GERD.  5. Diabetic neuropathy.  6. History of chronic lower extremity edema.  7. Erectile dysfunction.  8. Restless leg syndrome.  9. Sleep apnea on CPAP.  10.Bladder cancer, as outlined above.  11.Carotid ultrasound October 2009 demonstrating plaque and less than      50% bilateral ICA stenosis.  12.Echocardiogram November 2009 demonstrating normal LV function, mild-      to-moderate LVH.   MEDICATIONS AT HOME:  1. Lisinopril 40 mg daily.  2. Omeprazole 40 mg daily.  3. Metformin 1000 mg b.i.d.  4. Atenolol 100 mg daily.  5. Simvastatin 20 mg daily.  6. Gabapentin 300 mg 2 tablets q.h.s.  7. Furosemide 40 mg daily.  8. Glipizide 10 mg b.i.d.  9. Klor-Con 10 mEq daily.  10.Viagra 100 mg p.r.n.  11.NovoLog 70/30 90 units in the  morning, 70 units in the evening.  12.Aspirin 81 mg daily.  13.Fish oil 1 g b.i.d.  14.Spironolactone 25 mg daily.  15.Mirapex 0.5 mg q.h.s.  16.Avandia 8 mg daily.  17.Trazodone 100 mg q.h.s.   ALLERGIES:  No known drug allergies.   SOCIAL HISTORY:  The patient lives in [place] with his wife.  He has  2 children.  He has a 40-pack-year history of smoking and continues to  smoke at 1-1/2 packs of cigarettes per day.  No alcohol abuse.  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  He denies illicit drug abuse.   FAMILY HISTORY:  Significant for diabetes and renal carcinoma in his  mother who died at age 8.  His father died with alcoholic cirrhosis at  age 73, and he has a brother with HIV.  No premature CAD is noted.   REVIEW OF SYSTEMS:  Please see HPI.  He does note headaches.  Denies  sore throat.  He does note a rash consistent with stasis changes of his  bilateral lower extremities.   He denies syncope or near-syncope.  He has  had a nonproductive cough.  He denies hemoptysis.  He denies dysuria,  hematuria, bright red blood per rectum, melena, dysphagia or GERD  symptoms.  All other systems reviewed and negative.   PHYSICAL EXAMINATION:  He is a well-nourished, well-developed male in  distress.  Blood pressure is 115/71, pulse 93, respirations 20, temperature 96.6,  oxygen saturation 91% on room air.  HEENT:  Normal.  NECK:  With minimal JVD.  LYMPH:  Without lymphadenopathy.  ENDOCRINE:  Without thyromegaly.  CARDIAC:  Normal S1 and S2.  Regular rate and rhythm with a 2/6 systolic  ejection murmur heard best along the left sternal border.  LUNGS:  With dry bibasilar crackles.  Otherwise clear.  No wheezing.  ABDOMEN:  Soft, nontender with normoactive bowel sounds.  No  organomegaly.  SKIN:  With stasis changes of his bilateral lower extremities noted.  EXTREMITIES:  1-2+ edema bilaterally.  MUSCULOSKELETAL:  Without joint deformity.  Multiple varicosities noted.  NEUROLOGIC:  He is alert and oriented x3.  Cranial nerves II-XII grossly  intact.  VASCULAR:  Without carotid bruits bilaterally.  Dorsalis pedis and  posterior tibialis pulses are 2+ bilaterally.  Femoral arteries without  bruits bilaterally.   Chest x-ray:  Cardiomegaly with slight pulmonary vascular congestion,  low lung volumes without acute infiltrate.  EKG:  Sinus rhythm with  heart rate of 79, left axis deviation, no acute changes.   LABORATORY DATA:  Hemoglobin 9.4, MCV 75.9, platelet count 136,000.  Potassium 5.2, creatinine 1.38, glucose 78.  Albumin 3.5.  Cardiac  markers negative x2.  TSH 1.028.  BNP 71.  Magnesium 2.  Total  cholesterol 167, triglycerides 206, HDL 29, LDL 97.   ASSESSMENT:  1. Exertional chest pain and shortness of breath.  2. Lower extremity edema.  3. Good left ventricular function.  4. Diabetes mellitus.  5. Hypertension.  6. Hyperlipidemia.  7. Sleep apnea.   8. Tobacco abuse.  9. Bladder cancer status post resection.  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  11.Microcytic anemia and thrombocytopenia.   RECOMMENDATIONS:  The patient was also seen and examined by Dr. Daleen Squibb.  His dyspnea is likely multifactorial with probable underlying COPD and  questionable pulmonary hypertension as well as deconditioning and  obesity.  He has multiple cardiac risk factors and chest pain that is  somewhat atypical.  Ultimately he would probably benefit from a right  and left heart catheterization.  However, given his other comorbidities  and complicating factors, will hold off on this for now.  He was also  interviewed and examined by Dr. Daleen Squibb today.  After further review with  Dr. Daleen Squibb, it has been decided to proceed with GXT stress Myoview study  in the morning.  His creatinine is somewhat borderline.  His potassium  is borderline elevated as well.  His Aldactone will be held.  His workup  for microcytic anemia is currently pending.  He is currently receiving  IV Lasix.  We will see how he responds to this in regards to his edema.  BMET will be obtained in the morning to follow up on his renal function  and potassium.  He has been advised to quit smoking.  He has also been  advised to lose 30 pounds in the next 6 months.   Thank you very much for the consultation.  We will be glad to follow the  patient throughout the remainder of this admission.      Tereso Newcomer, PA-C      Jesse Sans. Daleen Squibb, MD, [facility]  Electronically Signed    SW/MEDQ  D:  03/01/2009  T:  03/01/2009  Job:  [id number]   cc:   Erle Crocker, M.D.   Dorris Singh, DO

## 2011-04-08 NOTE — H&P (Signed)
NAME:  Kyle [name], Kyle [name] NO.:  [id number]   MEDICAL RECORD NO.:  [id number]          PATIENT TYPE:  INP   LOCATION:  A320                          FACILITY:  [facility]   PHYSICIAN:  Elliot Cousin, M.D.    DATE OF BIRTH:  [birth year]   DATE OF ADMISSION:  05/07/2009  DATE OF DISCHARGE:  LH                              HISTORY & PHYSICAL   PRIMARY CARE PHYSICIAN:  Erle Crocker, M.D.   PRIMARY UROLOGIST:  Heloise Purpura, MD.   PRIMARY CARDIOLOGIST:  Gerrit Friends. Dietrich Pates, MD, [facility].   PRIMARY GASTROENTEROLOGIST:  [name], M.D.   CHIEF COMPLAINT:  Rectal bleeding.   HISTORY OF PRESENT ILLNESS:  The patient is a 55 year old man with a  past medical history significant for iron deficiency anemia, left  adrenal mass, obstructive sleep apnea and type 2 diabetes mellitus.  He  presents to the emergency department today with a chief complaint of  bright red blood per rectum.  The bleeding started at approximately 3:00  a.m. this morning when he got up to use the bathroom.  He had a loose  bowel movement that was intermixed with blood.  The patient states that  the toilet water was completely red.  Throughout the morning, he  proceeded to have four Kyle [name] loose and bloody bowel movements.  He  estimates that with each bowel movement, he bled approximately one cup  of blood.  He did feel a little lightheaded and short of breath on  arrival to the emergency department.  However, he had no such complaints  at home.  He denies associated abdominal pain and cramping, chest pain,  painful urination or vomiting.  He did have one episode of nausea.  He  denies alcohol use.  He took only one Excedrin recently but denies aking  any other NSAID products.   During the evaluation in the emergency department, the patient is noted  to be afebrile and hemodynamically stable.  The emergency department  physician, Dr. Estell Harpin, performed a rectal exam and the results were  guaiac positive.   His hemoglobin is 10.1.  His serum potassium is 5.4  and his venous glucose is 222.  His BUN is 17 and his creatinine is  1.31.  The patient is being admitted for further evaluation and  management.   PAST MEDICAL HISTORY:  1. Iron deficiency anemia.  The patient's hemoglobin today is 10.1.      On Apr 16, 2009 his ferritin was 10 and his total iron was 35.  2. Incomplete colonoscopy performed by Dr. Jena Gauss on April 26, 2009.  3. Capsule endoscopy in May of 2010 revealed two small bowel erosions      per Dr. Jena Gauss.  EGD on Apr 02, 2009 per Dr. Jena Gauss revealed no acute      abnormality and specifically no evidence of portal gastropathy.  4. Colonoscopy in October 2008 revealed hemorrhoids and pancolonic      diverticula.  5. Left adrenal mass, pending adrenalectomy per Dr. Laverle Patter in July of      2010.  6. History of bladder  cancer, status post resection January 22, 2009 by      Dr. Laverle Patter.  7. Dyspnea secondary to anemia and obstructive sleep apnea as well as      vitamin B12 deficiency.  Workup negative for congestive heart      failure and myocardial infarction in March and April of 2010 by      [facility].  8. Hypertension.  9. Obstructive sleep apnea, the patient uses CPAP at night.  10.Dyslipidemia.  11.Vitamin B12 deficiency.  12.Type 2 diabetes mellitus.  13.Peripheral neuropathy.  14.History of splenomegaly.  15.Recent history of thrombocytopenia.  16.Tobacco abuse.  17.Restless leg syndrome.  18.History of fatty infiltration of the liver.  19.Urinary tract infection 2 weeks ago, treated with an antibiotic      (the patient cannot recall the name of the antibiotic).  20.Obesity.   MEDICATIONS:  1. Lisinopril 40 mg daily.  2. Omeprazole 40 mg daily.  3. Metformin 1000 mg b.i.d.  4. Atenolol 100 mg daily.  5. Simvastatin 20 mg daily.  6. Gabapentin 300 mg 2 tablets at bedtime.  7. Furosemide 40 mg 2 tablets daily.  8. Glipizide 10 mg b.i.d.  9. Klor-Con 10 mEq  daily.  10.Viagra 100 mg as needed.  11.NovoLog mix 70/30 90 units q.a.m. and 70 units before dinner.  12.Multivitamin once daily.  13.Fish oil concentrate 1000 mg b.i.d.  14.Spironolactone 25 mg daily.  15.Mirapex 0.5 mg nightly.  16.Avandia 8 mg daily.  17.Trazodone 100 mg nightly.   ALLERGIES:  THE PATIENT HAS AN ALLERGY TO TYLENOL WHICH CAUSES WORSENING  RESTLESS LEG SYNDROME.   SOCIAL HISTORY:  The patient is married.  [place]  He has five children.  He receives disability.  He smokes 1-  1/2 packs of cigarettes per day.  He denies illicit drug use and alcohol  use.   FAMILY HISTORY:  His mother is 26 years of age and has diabetes  mellitus, psoriasis and a history of kidney cancer.  His father died  from either a myocardial infarction or complications of cirrhosis.   REVIEW OF SYSTEMS:  As above in the history of present illness.   EXAMINATION:  Temperature 97.9, blood pressure 121/67, pulse 84,  respiratory rate 20, oxygen saturation 92% on room air.  GENERAL:  The patient is a pleasant alert 55 year old Caucasian man who  is currently sitting up in bed in no acute distress.  HEENT:  Head is normocephalic, nontraumatic.  Pupils equal, round and  reactive to light.  Extraocular movements are intact.  Conjunctivae are  clear.  Sclerae are white.  Tympanic membranes not examined.  No sinus  tenderness.  Oropharynx reveals mildly dry mucous membranes.  No  posterior exudates or erythema.  NECK:  Supple.  No adenopathy, no thyromegaly, no bruit.  No JVD.  LUNGS:  Clear to auscultation bilaterally.  HEART:  S1, S2 with a soft systolic murmur.  ABDOMEN:  Obese.  Positive bowel sounds.  Soft, nontender, nondistended.  No hepatosplenomegaly.  No masses palpated.  RECTAL/GU:  Deferred.  EXTREMITIES:  Multiple varicosities around both ankles.  Pedal pulses  palpable.  No pretibial edema and no pedal edema.  NEUROLOGIC:  The patient is alert and  oriented x3.  Cranial nerves II-  XII are intact.  Strength is 5/5 throughout.   ADMISSION LABORATORIES:  Acute abdominal series.  The results revealed  borderline cardiomegaly.  No active disease. No acute or specific  abdominal findings.  WBC 4.1,  hemoglobin 10.1, hematocrit 30.6, platelets 196, MCV 83.0.  Sodium 135, potassium 5.4, chloride 101, CO2 is 26, glucose 222, BUN 17,  creatinine 1.31, calcium 9.6.   ASSESSMENT:  1. Gastrointestinal bleed and anemia.  Given the patient's history of      hemorrhoids and diverticulosis, it is likely the bleeding is from      one or both of these sources.  He recently underwent a colonoscopy      on April 26, 2009.  However, the preparation was incomplete and      therefore no reliable results were obtained.  The patient also      underwent evaluation with a capsule endoscopy and      esophagogastroduodenoscopy in May of 2010 and the results were      significant for two small bowel erosions per the capsule endoscopy      only.  His ferritin was noted to be 10 on Apr 16, 2009.  His      hemoglobin currently is 10.1 although it is expected to decrease      with intravenous fluids and equilibration.  2. Hyperkalemia.  The patient's serum potassium is 5.4.  He is treated      chronically with potassium chloride supplementation along with      diuretic therapy.  3. Type 2 diabetes mellitus.  The patient's venous glucose is      moderately elevated.  He is treated with multiple medications      including 70/30 insulin, Avandia, metformin and Glipizide.  He      states that his capillary blood glucose generally ranges in the      150s.  4. Left adrenal mass.  There is a pending adrenalectomy by urologist,      Dr. Laverle Patter in July of 2010.  5. History of splenomegaly and thrombocytopenia.  The patient does not      have thrombocytopenia today.  There was a concern about whether or      not the patient had underlying cirrhosis.  However, it was  surmised      that the patient probably did not have cirrhosis as there were no      obvious stigmata of cirrhosis on the esophagogastroduodenoscopy and      capsule endoscopy.  6. Tobacco abuse.   PLAN:  1. The gastroenterologist, Dr. Cira Servant, has been consulted.  A      colonoscopy is planned for tomorrow.  2. We will monitor the patient's CBC every 8 hours.  His blood has      already been typed and crossed in the emergency department.  Will      start gentle IV fluids.  3. We will discontinue potassium chloride supplementation and recheck      his serum potassium after laxative therapy has been started.  4. We will change the beta blocker to metoprolol, every 6 hours with      parameters.  5. We will decrease the dose of 70/30inisulin in anticipation that the      patient will be either nothing by mouth or placed on a clear liquid      diet.  We will also add sliding scale NovoLog accordingly.  6. The patient was advised to stop smoking.  Tobacco cessation      counseling will be ordered.      Elliot Cousin, M.D.  Electronically Signed     DF/MEDQ  D:  05/07/2009  T:  05/07/2009  Job:  [id number]  cc:   [name], M.D.  [address]  [place]  [place] [address]   Heloise Purpura, MD  Fax: [phone]   Gerrit Friends. Dietrich Pates, MD, [facility]  [address]  [place], [place] [address]   Erle Crocker, M.D.

## 2011-04-08 NOTE — Consult Note (Signed)
NAME:  Kyle [name], Kyle [name] NO.:  [id number]   MEDICAL RECORD NO.:  [id number]          PATIENT TYPE:  INP   LOCATION:  A335                          FACILITY:  [facility]   PHYSICIAN:  Kyle Horns. Neijstrom, MD  DATE OF BIRTH:  November 10, 1956   DATE OF CONSULTATION:  02/28/2009  DATE OF DISCHARGE:                                 CONSULTATION   DIAGNOSES:  1. Adrenal mass.  2. Anemia.  3. Thrombocytopenia.  4. Splenomegaly.  5. Chronic obstructive pulmonary disease with a longstanding smoking      history of at least a pack a day for many many years.  6. History of excessive alcohol use in the past, drinking as much as 1-      2 pints per day, quitting in his 4s.  7. Superficial bladder cancer, status post resection by Dr. Laverle [name] and      that resection took place on January 22, 2009.  This was felt to be a      low-grade papillary carcinoma with no evidence for stromal      invasion.  8. Obesity.  9. Psoriasis.  10.Diabetes.  11.Hyperlipidemia.  12.Hypertension.  13.History of depression in the past.  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   This is a pleasant gentleman, who I was asked to see in consultation by  Dr. Dorris [name] after he was discussing with her this adrenal mass  that he has had described on both the CT abdomen from January 2010 as  well as an MRI from February 2010.  On the CT abdomen, he has described  a 4 x 5 x 5 cm left adrenal mass with medium-to-low attenuation and also  has described an enlarged spleen that is 17.6 cm cephalocaudad and AP  diameter is 13.5 cm.  The MRI done in February showed a 4.9-cm mass  consistent with a benign adenoma based upon their findings, but spleen  was enlarged to 18 cm.   He also has described as having a hepatic cyst in the right hepatic  lobe, normal appearing gallbladder, no enlarged upper abdominal nodes at  that time and that report was from January 01, 2009.  He also had a CT  angiogram in March  of his chest, which showed no evidence for pulmonary  embolism and no evidence for a lung mass.   He has had hypertension, he states for at least 10 years.  He thinks he  was checked for HIV sometime in the distant past, but not in any recent  years.   He states he has a twin brother, who is HIV positive, who I have seen in  the past.  He states he is now being cared for by the Infectious Disease  people in [place].  He has no other sibling he states.  His father  died of alcohol with complications of liver disease.  His mother still  alive with severe COPD.   He has been married 3 times.  His wife is present in the room.  They  have been married since 2001.   They  have no children together, but he has 3 boys by his second wife and  he states they are in decent health.   His vital signs show a height of 5 feet 7 inches.  His weight 90.6 kg.  He is afebrile.  His pulse is around 72 and regular, respirations 18 and  unlabored.  His blood pressure is 114/68.   He has no obvious adenopathy.  His tongue is unremarkable.  Facial  symmetry appears intact.  He does have a beard.  He has no obvious  thyromegaly.  He has no obvious adenopathy in the cervical,  supraclavicular, infraclavicular, axillary, or inguinal areas.  He has  1+ pitting pretibial edema bilaterally.  He does have increased  pigmentation anteriorly in the shins consistent with iron pigmentation  deposition.  He does not have obvious varicosities.  He does have  changes of psoriasis on his lower legs in particular and in his gluteal  crease superiorly.   He has clear lung fields, but there are diminished breath sounds.  His  heart shows a regular rhythm and rate without obvious murmur, rub, or  gallop.  His abdomen is obese.  I think I can feel a spleen tip, but I  am not absolutely sure of that.  He is overweight without question.  I  cannot feel his liver and bowel sounds are diminished, but present.  He  is in no  acute distress at this time.  What is interesting is that his  labs are distinctly abnormal.  In March, he had a hemoglobin 9.8 and MCV  is 77, platelet count of 124,000.  White count 5900.  His hemoglobin in  February was actually 9.9.  He had a BMET in March which also showed  normal BUN and creatinine, normal set of electrolytes.  There is also  normal calcium level.   Other labs from today, it sounds like are pending.   He did have a chest x-ray today, which showed mild cardiac enlargement,  slight pulmonary vascular congestion.  No gross failure or gross  pulmonary edema.   This gentleman is of interest and that he has an adrenal mass.  I would  like to review the films with the radiologist.  I would also like to  check with Dr. Laverle [name] about what labs he has checked on him as far as a  24-hour urine collection, it sounds like VMAs and catecholamines have  been checked, but I do not have those results and sounds like Kyle [name]  took some urine to him just earlier this week.   I think with a splenomegaly, his thrombocytopenia.  His anemia needs an  anemia panel.  He needs an HIV testing, acute hepatitis panel, and liver  panel at this point in time.   I need to see him in followup before recommending to him any further  discussions about removing the adrenal mass or not and I of course, need  to talk to Dr. Laverle [name] and Dr. Jen [name] about this gentleman.      Kyle Horns. Mariel Sleet, MD  Electronically Signed     ESN/MEDQ  D:  02/28/2009  T:  03/01/2009  Job:  [id number]   cc:   Kyle [name], M.D.   Kyle Purpura, MD  Fax: [phone]

## 2011-04-08 NOTE — Group Therapy Note (Signed)
NAME:  Kyle [name], Kyle [name] NO.:  [id number]   MEDICAL RECORD NO.:  [id number]          PATIENT TYPE:  INP   LOCATION:  A335                          FACILITY:  [facility]   PHYSICIAN:  Dorris Singh, DO    DATE OF BIRTH:  [birth year]   DATE OF PROCEDURE:  03/01/2009  DATE OF DISCHARGE:                                 PROGRESS NOTE   The patient is seen today.  He has been seen by Cardiology, also had an  echo today with results pending.  He states that he still feels about  the same, but would like to go home.  Dr. Mariel Sleet saw him regarding  his adrenal adenoma and found that he had splenomegaly and actually  ordered more tests on him that he can follow up outpatient.  I explained  the patient that as soon as Cardiology made need further recommendations  and we will be able to discharge him within the next 24 hours.   PHYSICAL EXAMINATION:  VITAL SIGNS:  Current vitals are pending.  GENERAL:  The patient is well-developed, well-nourished in acute  distress.  HEART:  Regular rate and rhythm.  LUNGS:  Clear to auscultation bilaterally.  ABDOMEN:  Round, soft, nontender, nondistended.  EXTREMITIES:  Positive pulses.  No edema, ecchymosis, or cyanosis.   His labs today; white count of 6.5, hemoglobin of 9.4, hematocrit of  23.9, platelet count of 136.  His direct total bilirubin is 0.2 and his  indirect bilirubin is 0.16, the AST is 49.  All of his cardiac enzymes  are negative.   ASSESSMENT AND PLAN:  1. Shortness of breath, are awaiting further testing to determine if      this is due to possible CHF.  2. Chest pain.  The patient has been chest pain free.  Cardiology is      also seem to assess and will make further recommendations for what      they feel is appropriate for him.  3. Anemia.  Dr. Mariel Sleet has been consulted on the case and has      recommended several tests that he will follow up as outpatient.  4. Symptoms of claudication.  Also, Cardiology will  address this.  5. Adrenal tumor.  Dr. Mariel Sleet has evaluated this as well and await      any further recommendations from him regarding this.      Dorris Singh, DO  Electronically Signed     CB/MEDQ  D:  03/01/2009  T:  03/02/2009  Job:  [id number]

## 2011-04-08 NOTE — Assessment & Plan Note (Signed)
Kyle [name]                        CARDIOLOGY OFFICE NOTE   Kyle [name], Kyle [name]                        MRN:          [id number]  DATE:04/09/2009                            DOB:          [birth year]    CARDIOLOGIST:  Dr. Dietrich Pates.   UROLOGIST:  Dr. Heloise Purpura, at [facility] in [place].   PRIMARY CARE PHYSICIAN:  Dr. Erle Crocker.   REASON FOR VISIT:  Follow-up.   HISTORY OF PRESENT ILLNESS:  Kyle [name] is a 55 year old male patient  with a history of exertional dyspnea who had been evaluated by Dr.  Dietrich Pates in November 2009 for syncope.  We saw him at [facility] in early April 2010 with chest pain and shortness of breath.  He had an echocardiogram that demonstrated normal LV function with an EF  of 60%, no wall motion abnormalities.  He had a stress study performed  April 9th that demonstrated prominent physiological apical thinning and  borderline abnormalities in a small basilar inferior segment.  The  possibility of minimal inferior ischemia and minimal apical scarring  could not be unequivocally excluded.  This was overall felt to be low-  risk.  It should be noted that Dr. Dietrich Pates had sent correspondence to  Dr. Laverle Patter on March 15, 2009, that explained the patient is at increased  risk for cardiovascular complications during his surgery in light of his  history of obstructive sleep apnea, tobacco abuse, probable COPD,  insulin-dependent diabetes mellitus, hypertension, hyperlipidemia.  However, no further preop evaluation was planned or therapy required  prior to his planned adrenalectomy unless his symptoms should start to  change.  The patient has not been seen since discharge from the hospital  and returns to the office today for a follow-up.  His surgery is  apparently pending in mid June.  He has had biochemical analysis done to  rule out pheochromocytoma.  This was apparently negative and the patient  is to have adrenalectomy performed.  He is apparently also seeing Dr.  Mariel Sleet for anemia as well as gastroenterology for enlarged spleen and  elevated LFTs.  He apparently has some workup ongoing at this time to  determine whether or not he should undergo splenectomy at the time of  his adrenalectomy.  From a cardiac standpoint, he is doing well.  He  denies any significant shortness of breath.  He describes NYHA Class II  symptoms.  He denies orthopnea or paroxysmal nocturnal dyspnea.  He does  have some lower extremity edema that is stable without change.  He  denies syncope.  He denies any chest discomfort.   CURRENT MEDICATIONS:  Lisinopril 40 mg daily, Omeprazole 40 mg daily,  Metformin 1 gram b.i.d., Atenolol 100 mg daily, Simvastatin 20 mg daily,  Gabapentin 300 mg 2 tablets nightly,  Furosemide 40 mg b.i.d., Glipizide 10 mg b.i.d., Potassium 10 mEq daily,  Multivitamin daily, Fish Oil 1 gram b.i.d.,  NovoLog 70/30, 90 units in the morning, 70 units at bedtime, Serax  150/50 daily, Spirolactone 25 mg daily, Viagra p.r.n., Aleve p.r.n.,  Mirapex 0.5 mg nightly,  Avandia  8 mg daily, Trazodone 100 mg nightly, B12 every week.   SOCIAL HISTORY:  He continues to smoke cigarettes.   REVIEW OF SYSTEMS:  Please see HPI.  Denies significant cough.  Denies  fevers.   PHYSICAL EXAMINATION:  He is a well-nourished, well-developed male in no  acute distress.  Blood pressure is 110/72, pulse 82, weight 257 pounds.  HEENT:  Normal.  NECK:  Supple, without JVD.  CARDIAC:  Normal S1-S2, regular rate and rhythm.  LUNGS:  Clear to auscultation bilaterally.  ABDOMEN:  Soft, nontender.  EXTREMITIES:  With trace edema bilaterally.  NEUROLOGIC:  He is alert and oriented x3.  Cranial nerves II-XII are  grossly intact.  SKIN:  Warm and dry.   Electrocardiogram demonstrates sinus rhythm with a heart rate of 82,  left axis deviation, poor R-wave progression, no significant change  since previous  tracing.   ASSESSMENT/PLAN:  1. Adrenal mass.  The patient needs adrenalectomy in the near future.      His cardiac workup in the past has included an echocardiogram and      stress nuclear study as outlined above.  His findings have overall      been reassuring.  His stress nuclear study was low risk.  As      previously outlined by Dr. Dietrich Pates, given his multiple      comorbidities, he is at moderate risk at best for development of      complications in the perioperative period.  However, he requires no      further cardiovascular workup prior to his noncardiac procedure.      He will continue on the above-stated medications throughout the      perioperative period.  Our service in [place] will certainly be      available in the perioperative period as necessary.  2. Exertional dyspnea.  This is overall stable and has improved since      he was placed on iron and B12 for his anemia.  His dyspnea was      likely multifactorial.  He had no evidence of congestive heart      failure in the hospital and does have preserved left ventricular      function.  3. Hypertension.  This is overall well controlled.  I would continue      on his current medications and follow-up with Dr. Jen Mow.  4. Dyslipidemia.  He continues on simvastatin and will follow up with      Dr. Jen Mow.  Of note, during his hospitalization his lipid panel      demonstrated a total cholesterol of 167, triglycerides 206, HDL 29      and LDL of 97.   DISPOSITION:  The patient will be brought back in follow-up with Dr.  Dietrich Pates as previously scheduled.  As noted above, our service will  certainly be available in the perioperative period as necessary.      Tereso Newcomer, PA-C  Electronically Signed      Jesse Sans. Daleen Squibb, MD, [facility]  Electronically Signed   SW/MedQ  DD: 04/09/2009  DT: 04/09/2009  Job #: [id number]   cc:   Heloise Purpura, MD  Erle Crocker, M.D.

## 2011-04-08 NOTE — Procedures (Signed)
NAME:  Kyle [name], Kyle [name] NO.:  [id number]   MEDICAL RECORD NO.:  [id number]          PATIENT TYPE:  OUT   LOCATION:  SLEEP LAB                     FACILITY:  [facility]   PHYSICIAN:  Barbaraann Share, MD,FCCPDATE OF BIRTH:  02/27/[birth year]   DATE OF STUDY:  10/08/2008                            NOCTURNAL POLYSOMNOGRAM   REFERRING PHYSICIAN:  Erle Crocker, M.D.   REFERRING PHYSICIAN:  Erle Crocker, MD   INDICATION FOR STUDY:  Hypersomnia with sleep apnea.   EPWORTH SLEEPINESS SCORE:  13.   MEDICATIONS:   SLEEP ARCHITECTURE:  The patient had a total sleep time of 243 minutes  with adequate slow-wave sleep, but significantly decreased REM.  Sleep  onset latency was prolonged at 49 minutes, and REM onset was very  prolonged at 308 minutes.  Sleep efficiency was poor at 55%.   RESPIRATORY DATA:  The patient was found to have 9 obstructive apneas  and 118 hypopneas for an AHI of 31 events per hour.  The events were not  positional, and there was loud snoring noted throughout.  The patient  did not meet split-night criteria secondary to the majority of his  events occurring after 1:00 a.m.   OXYGEN DATA:  The patient was found to have O2 desaturation as low as  79% with his obstructive events.  The sleep tech placed the patient on  nasal oxygen at 1 liter at 0300 hours for unknown reasons.  There is no  documentation in the sleep record why this was done, and I am unable to  come up with a medically relevant reason for this.  His low O2  saturation during this time was only 88%.   CARDIAC DATA:  Occasional PACs were noted, however, the patient did have  1 run of supraventricular tachycardia at a rate of 168 beats per minute.  This occurred near a grouping of obstructive hypopneas, but it is  unclear whether they are indeed related.   MOVEMENT-PARASOMNIA:  The patient was found to have 211 periodic leg  movements with 3 per hour resulting in arousal or awakening.   IMPRESSIONS-RECOMMENDATIONS:  1. Moderate obstructive sleep apnea/hypopnea syndrome with an apnea-      hypopnea index of 31 events per hour and O2 desaturation as low as      79%.  Treatment for this degree of sleep apnea can include weight      loss alone if applicable, upper airway surgery, oral appliance, and      CPAP.  The patient did not meet split-night criteria on this study      secondary to the majority of his events occurring after 1:00 a.m.  2. Occasional premature atrial contractions were noted with one run of      supraventricular tachycardia at a rate of 168 beats per minute.      Again, clinical correlation is suggested.  It is unclear whether      this is related directly to the patient's obstructive sleep apnea.  3. Very large numbers of leg jerks with significant sleep disruption      noted.  The patient has a history  of chronic nocturnal leg pain,      and it is unclear if this is due to the restless leg syndrome or an      underlying process such as musculoskeletal pain or neuropathy.  It      is unknown if the patient has been tried on a dopamine agonist in      the past.  Clinical correlation is suggested.      Barbaraann Share, MD,FCCP  Diplomate, American Board of Sleep  Medicine  Electronically Signed     [facility]/MEDQ  D:  10/17/2008 15:28:30  T:  10/18/2008 02:37:31  Job:  [id number]

## 2011-04-08 NOTE — Op Note (Signed)
NAME:  Kyle [name], Kyle [name] NO.:  [id number]   MEDICAL RECORD NO.:  [id number]          PATIENT TYPE:  AMB   LOCATION:  DAY                           FACILITY:  [facility]   PHYSICIAN:  R. Roetta Sessions, M.D. DATE OF BIRTH:  10-Feb-1956   DATE OF PROCEDURE:  09/21/2007  DATE OF DISCHARGE:                               OPERATIVE REPORT   ESOPHAGOGASTRODUODENOSCOPY WITH BIOPSY FOLLOWED BY DIAGNOSTIC  COLONOSCOPY:   INDICATIONS FOR PROCEDURE:  A 55 year old gentleman with intermittent  hematochezia and longstanding gastroesophageal reflux disease.  EGD and  colonoscopy are now being done.  He has never had his lower or upper GI  tract evaluated previously.  There is no family history of colorectal  cancer.  His reflux symptoms are currently well-controlled on Prevacid  30 mg orally daily.  Please see the documentation in the medical record  for more formation.   PROCEDURE NOTE:  O2 saturation, blood pressure, pulse and respirations  were monitored throughout the entire procedure.  Conscious sedation:  Versed 3 mg IV, Demerol 75 mg IV in divided doses.  Cetacaine spray for  topical pharyngeal anesthesia.  Instrument:  Pentax video chip system.   FINDINGS:  EGD:  Examination of the tubular esophagus revealed a  patulous EG junction and an accentuated, undulating Z-line.  There was,  however, a 3-cm patch of salmon-colored epithelium coming up into the  distal esophagus.  Please see photos.  The esophageal mucosa otherwise  appeared normal.  The EG junction was easily traversed with the scope.   Stomach:  The gastric cavity was empty and insufflated well with air.  A  thorough examination of the gastric mucosa including retroflexion of the  proximal stomach and esophagogastric junction demonstrated nothing more  than bile-stained mucus and a small hiatal hernia.  Pylorus was patent  and easily traversed.  Examination of the bulb and second portion  revealed no  abnormalities.   Therapeutic/diagnostic maneuvers performed:  None.   The patient tolerated the procedure well and was repaired for  colonoscopy.  Digital rectal exam revealed no abnormalities.  Endoscopic  findings:  The prep was somewhat suboptimal.   Colon:  The colonic mucosa was surveyed from the rectosigmoid junction  through the left, transverse and right colon to area of the appendiceal  orifice, ileocecal valve and cecum.  These structures were ultimately  well-seen and photographed for the record.  From this level the scope  was slowly and cautiously withdrawn.  All previously-mentioned mucosal  surfaces were again seen.  The patient had quite a bit of tenacious  stool and vegetable matter lining the colon, particularly on the right  side, which had to be copiously washed and suctioned to get adequate  visualization of the colon.  The patient had scattered pancolonic  diverticula.  The remainder of the colonic mucosa, however, appeared  normal.  The scope was pulled down into the rectum, where a thorough  examination of the rectal mucosa including an en face view of the anal  canal was undertaken.  The patient had a friable anal canal.  Otherwise,  the rectal mucosa appeared normal.  The patient tolerated both  procedures well and was reacted in endoscopy.   IMPRESSION:  EGD:  1. A 3-cm tongue of salmon-colored epithelium coming up into the      distal esophagus, suspicious for Barrett's esophagus, biopsied.  2. Patulous esophagogastric junction.  3. Small hiatal hernia.  4. Otherwise normal stomach aside from bile-stained mucus.  5. Patent pylorus, normal D1, D2.   Colonoscopy findings:  1. Friable anal canal with an otherwise normal rectum.  2. Pancolonic diverticula.  The remaining colonic mucosa appeared      normal.  I suspect the patient has bled from anorectal irritation.   RECOMMENDATIONS:  1. Continue Prevacid 30 grams orally daily indefinitely.  2. Follow  up on pathology.  3. Diverticulosis/hemorrhoid literature provided to Mr. Lehigh.  Daily      Metamucil fiber supplement recommended.  4. A 10-day course of Anusol-HC suppositories one per rectum at      bedtime.  5. Further recommendations to follow.      [name], M.D.  Electronically Signed     RMR/MEDQ  D:  09/21/2007  T:  09/21/2007  Job:  [id number]   cc:   Patsy Baltimore

## 2011-04-08 NOTE — Letter (Signed)
November 09, 2008    Hillery Aldo, MD  [address]  [place], [place] [address]   RE:  Kyle [name], Kyle [name]  MRN:  [id number]  /  DOB:  Dec 19, 1955   Dear [name],   Mr. Kyle [name] returns to the office for continued assessment and treatment  of syncope.  Since his last visit, he has not suffered any spells of  dizziness nor loss of consciousness.  Evaluation for obstructive sleep  apnea continues.  He was fitted for mask with suggested settings  identified during a followup study.  He has not yet been provided with  device to use at home.  He has carried an event recorder for 2 weeks.  Absolutely, no arrhythmias were identified; however, during the sleep  study, a period of SVT was noted.  The duration was not specified.   Medications are unchanged from his last visit except for change in his  insulin regime.   PHYSICAL EXAMINATION:  GENERAL:  Pleasant overweight gentleman.  VITAL SIGNS:  The weight is 256, 1 pound more than at his last visit.  Blood pressure 130/80, heart rate 90 and regular, and respirations 14.  NECK:  No jugular venous distention.  LUNGS:  Clear.  CARDIAC:  Normal first heart sounds; slightly accentuated aortic  component of the second heart sound; modest systolic ejection murmur.  ABDOMEN:  Soft and nontender; no organomegaly.  EXTREMITIES:  No edema.   ECHOCARDIOGRAM:  Normal left ventricular size and function; no  significant valvular abnormalities.  LVH is present as well as slight  septal thickening, which could indicate elevated right ventricular  systolic pressure.   IMPRESSION:  Mr. Kyle [name] is doing well symptomatically.  I am inclined  to attribute his episodic impairment of consciousness to his sleep  apnea.  He may have degree of pulmonary hypertension as well.  It would  be reasonable to establish continuous positive airway pressure and  supplemental oxygen for a number of weeks, before we decide whether  additional testing is  warranted.  Mr. Kyle [name] will finish his 1 month of  event recording.  I will see him again in 2 months.    Sincerely,      Gerrit Friends. Dietrich Pates, MD, [facility]  Electronically Signed    RMR/MedQ  DD: 11/09/2008  DT: 11/10/2008  Job #: [id number]

## 2011-04-08 NOTE — Op Note (Signed)
NAME:  DEMBA, NIGH NO.:  [id number]   MEDICAL RECORD NO.:  [id number]          PATIENT TYPE:  AMB   LOCATION:  DAY                           FACILITY:  [facility]   PHYSICIAN:  R. Roetta Sessions, M.D. DATE OF BIRTH:  Aug 10, 1956   DATE OF PROCEDURE:  04/12/2009  DATE OF DISCHARGE:                               OPERATIVE REPORT   PROCEDURE:  Small bowel capsule.   INDICATIONS FOR PROCEDURE:  A 55 year old gentleman with iron-deficiency  anemia, Hemoccult-positive stool.  He has an adrenal mass.  Plans are  being made for resection in the near future.  Also splenomegaly as a  question of underlying liver disease.  EGD is now being done to further  evaluate for portal gastropathy and in part to determine a cause of the  iron-deficiency anemia/Hemoccult-positive stool.  If EGD is  unremarkable, then a capsule is to be deployed into his small bowel.  This was the algorithm.  The risks, benefits, alternatives, and  limitations have been discussed with Mr. Deutschman and his wife.   EGD FINDINGS:  Revealed a normal esophagus, a small hiatal hernia,  otherwise normal stomach.  D1 through D3 appeared normal.  There was no  stigmata of portal hypertension anywhere in the upper GI tract.  Capsule  deployed into the small bowel.   CAPSULE FINDINGS:  First duodenal image acquired at 5 minutes 56  seconds, 1 hour 59 minutes 55 seconds, first cecal image acquired at 2  hours 7 minutes 32 seconds.  The patient was noted to have a couple of  small erosions in the distal small bowel.  Otherwise there was no ulcer,  infiltrating process or other abnormality.  There was some iron residue  in the effluent along the way which made the mucosal surfaces appear  somewhat darker but they were well seen.   IMPRESSION:  A couple of small bowel erosions as described above,  otherwise unremarkable small bowel.  These appeared to be innocent.  Will be less likely to fully explain the degree of  iron-deficiency  anemia via mechanism of gastrointestinal bleeding.   His last colonoscopy was in October of 2008.  He was found have  hemorrhoids and pan colonic diverticula.  However, the prep was  relatively poor.   RECOMMENDATIONS:  He ought to go ahead and have a colonoscopy in the  near future to __________ possibility of any significant lesion or  remaining occult in his colon, particularly in view of the future  surgery  planned.  In addition, he is going to need hepatic vein wedge pressure  determination and transjugular liver biopsy at the same time to  determine whether or not he has significant advanced liver disease.  I  feel this should also be done prior to embarking on laparotomy for the  adrenal mass.  Further recommendations to follow.      [name], M.D.  Electronically Signed     RMR/MEDQ  D:  04/19/2009  T:  04/19/2009  Job:  [id number]   cc:   [facility]   Heloise Purpura, MD  Fax: [phone]   Ladona Horns. Mariel Sleet, MD  Fax: [phone]   Erle Crocker, M.D.

## 2011-04-08 NOTE — H&P (Signed)
NAME:  Kyle [name], Kyle [name] NO.:  [id number]   MEDICAL RECORD NO.:  [id number]          PATIENT TYPE:  INP   LOCATION:  A335                          FACILITY:  [facility]   PHYSICIAN:  Dorris Singh, DO    DATE OF BIRTH:  1956/08/28   DATE OF ADMISSION:  02/28/2009  DATE OF DISCHARGE:  LH                              HISTORY & PHYSICAL   The patient is a 55 year old Caucasian male who presented to his primary  care physician, Dr. Virgilio Belling office complaining of shortness of breath  with just walking short distances.  While he was there in her office,  she determined that he was anemic as well as having some leg swelling  and some chest pain.  She did an EKG in her office which did not show  any new EKG changes.  Also she did blood work in her office and checked  his troponins, which were negative.  The patient is also complaining of  some symptoms with walking where he feels tightness in his legs, around  his neck, and then once he stops all the symptoms go away.  He has had  an extensive workup just recently.  He is being seen by [facility] and has seen them in the past.  The patient has had a sleep  study done back in December 2009.  He also had an echocardiogram done  back in November 2009 as well as a carotid Doppler in October 2009.  The  patient also mentioned that he had been diagnosed with bladder cancer  which he has had bladder surgery and has had no more issues regarding  this.  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  He is seeing surgeon, Dr. Laverle Patter, who  has recommended that he have it removed.  The patient is concerned about  this because he does not know if he should have it removed and he has  been told this might be the cause of some of the issues that he is  currently having.   PAST MEDICAL HISTORY:  Significant for diabetes type 2.  Hyperlipidemia,  hypertension, depression, peripheral  neuropathy, OSA.  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   ALLERGIES:  He has no known drug allergies.   PAST SURGICAL HISTORY:  Removal of the bladder cancer.   FAMILY HISTORY:  Father is deceased from cirrhosis and EtOH abuse.  Mother is alive.  She has diabetes, RLS, and kidney cancer.  Has a  brother with HIV and diabetes, and has 3 sons who are all in good  health.  The patient currently has a wife.  However, there is a history  from Dr. Virgilio Belling office, he is separated and lives with his parents.  He  is a current smoker and smokes one-pack per day x25 years.  Denies any  alcohol or drug use.   REVIEW OF SYSTEMS:  Were mentioned above.  Shortness of breath, chest  pain, pain with walking, claudication, leg swelling and anemia.  MEDICATIONS:  1. Lisinopril 40 mg one p.o. daily.  2. Omeprazole one p.o. daily.  3. Metformin 1000 mg 1 p.o. t.i.d.  4. Atenolol 100 mg 1 p.o. daily.  5. Simvastatin 20 mg 1 p.o. daily.  6. Gabapentin 300 mg 1 to 2 p.o. q.h.s.  7. Furosemide 40 mg 1 p.o. daily.  8. Glipizide 10 mg one p.o. b.i.d.  9. Klor-Con 10 mEq one p.o. daily.  10.Viagra 100 mg as needed.  11.NovoLog 70/30, 90 units subcu before breakfast and 70 subcu before      dinner.  12.Baby aspirin 81 mg two p.o. daily.  13.Multivitamins one p.o. daily.  14.Fish oil concentrate, Omega 3 fatty acids one p.o. t.i.d.  15.Spironolactone 25 mg one p.o. daily.  16.Mirapex 0.5 mg 1 p.o. q.h.s.  17.Avandia 8 mg one p.o. daily.  18.Trazodone 100 mg one p.o. q.h.s.   PHYSICAL EXAM:  VITAL SIGNS:  Temperature 96.9, pulse 74, respirations  20, blood pressure 114/68.  GENERAL:  The patient is a 55 year old Caucasian male who is well-  developed, obese in no acute distress.  HEAD:  Normocephalic, atraumatic.  EYES:  Positive glasses.  PERRLA.  EOMI.  There is no scleral icterus or  conjunctival injection.  NOSE:  Turbinates are moist.  MOUTH:  Teeth are in  fair repair.  NECK:  Supple.  No lymphadenopathy noted.  HEART:  Regular rate and rhythm.  There is no murmur, rub or gallop.  LUNGS:  Clear to auscultation bilaterally with decreased breath sounds  throughout.  ABDOMEN:  Obese, round, nontender, nondistended.  Bowel sounds in all 4  quadrants.  EXTREMITIES:  Positive pulses including iliac pulses, as well as  dorsalis pedis.  Positive dermatitis stasis and +1 pitting edema.   LABS FOR TODAY:  White count of 6.6, hemoglobin 9.1, hematocrit 28.8,  platelet count of 129,000.  Sodium is 136, potassium 5.2, chloride 106,  CO2 25, glucose 78, BUN 22, creatinine 1.38.  His AST is 44, ALT is 32.  First troponin 0.1.  BNP is 70.  His EKG as mentioned before is normal  sinus rhythm with left axis deviation and his chest x-ray that was done  here in the hospital, two-view, shows cardiomegaly with slight pulmonary  vascular congestion, low lung volumes without acute infiltrate.   ASSESSMENT/PLAN:  1. Pulmonary congestion.  2. Chest pain.  3. Anemia.  4. Shortness of breath.  5. Claudication.  6. Adrenal tumor.   PLAN:  1. Will admit the patient to service of [facility] as a direct admit.      Will place him on telemetry.  Will consult [facility] and      also obtain a chest x-ray.  He has had echocardiogram and bilateral      carotid Doppler within the last 6 months.  Will have cardiology      determine if they would like a repeat study.  2. Also for his anemia, will continue with serial H and H and get iron      studies and hemoccult his stools.  Will see if it changes while he      is admitted.  Will try to find the etiology of this problem.  The      patient states that when he had his bladder cancer surgery, his      hemoglobin was around 9 as well.  3. The patient is concerned as to whether or not to have this removed.      Would like  a second opinion.  Will go ahead and consult Dr.      Mariel Sleet to see what he would like  done or whatever      recommendations he would have for the patient.  4. Shortness of breath.  Will continue with breathing treatments.      Currently the patient is pulse ox at 94% on room air.  Will see how      he improves.  This could also be secondary to tobacco smoking.      Counseled the patient on cessation of tobacco.  5. Claudication.  Will see what recommendations cardiology has.  Will      go ahead and continue to monitor him and change therapy as      necessary.      Dorris Singh, DO  Electronically Signed     CB/MEDQ  D:  02/28/2009  T:  02/28/2009  Job:  [id number]

## 2011-04-08 NOTE — Op Note (Signed)
NAME:  Kyle [name], Kyle [name] NO.:  [id number]   MEDICAL RECORD NO.:  [id number]          PATIENT TYPE:  OIB   LOCATION:  1437                         FACILITY:  [facility]   PHYSICIAN:  Heloise Purpura, MD      DATE OF BIRTH:  March 08, 1956   DATE OF PROCEDURE:  01/22/2009  DATE OF DISCHARGE:                               OPERATIVE REPORT   PREOPERATIVE DIAGNOSIS:  Bladder tumor.   POSTOPERATIVE DIAGNOSIS:  Bladder tumor.   PROCEDURES:  1. Cystoscopy.  2. Bilateral retrograde pyelography with interpretation.  3. Transurethral resection of bladder tumor (greater than 5 cm).  4. Pelvic exam under anesthesia.  5. Postoperative intravesical installation of mitomycin C.   SURGEON:  Heloise Purpura, M.D.   ANESTHESIA:  General.   COMPLICATIONS:  None.   ESTIMATED BLOOD LOSS:  50 mL.   SPECIMENS:  1. Left posterior bladder tumor.  2. Base of bladder tumor.   DISPOSITION:  To pathology.   INDICATIONS OF SPECIMENS:  Kyle [name] is a 55 year old gentleman who was  found to have an incidentally detected bladder tumor on a bladder  ultrasound.  He was evaluated in the office with flexible cystoscopy  which did demonstrate a large papillary bladder tumor.  It was,  therefore, discussed with and recommended to him that he should proceed  with transurethral resection as well as further evaluation of his upper  tract urothelium for both staging and potentially therapeutic purposes.  The potential risks, complications, and alternative options associated  with the above procedure were discussed in detail and informed consent  was obtained.   DESCRIPTION OF PROCEDURE:  The patient was taken to the operating room  and a general anesthetic was administered.  He was given preoperative  antibiotics, placed in the dorsal lithotomy position, and prepped and  draped in the usual sterile fashion.  Next, a preoperative time-out was  performed.  Cystourethroscopy was performed which  demonstrated a normal  anterior and posterior urethra.  Examination of the bladder demonstrated  the ureteral orifices to be in the normal anatomic position.  A complete  and thorough survey of the bladder revealed a very large papillary  bladder tumor just above the left ureteral orifice on the left posterior  aspect of the bladder measuring approximately 5.5 cm.  A saline bladder  washing had been previously obtained in the office.  Therefore, this was  not repeated.  The right ureteral orifice was then intubated with a 6-  [place] ureteral catheter and Omnipaque contrast was carefully injected.  This revealed no evidence of filling defects in the renal collecting  system or ureter.  There was no dilation or other abnormalities noted.  The left ureteral orifice was then similarly cannulated with a 6-French  ureteral catheter and Omnipaque was injected.  Again, no filling defects  in the renal collecting system or ureter were identified.  No dilation  or other abnormalities were noted.   Attention then returned to the bladder.  The cystoscope was removed and  a 28-French resectoscope sheath was placed into the bladder under direct  vision.  Using the loupe cutting resection, the patient's papillary  bladder tumor was resected in its entirety down to its base.  This tumor  specimen was removed and sent for permanent pathologic analysis.  At the  base of the bladder tumor, the cold cup biopsy forceps were used to  sample the base of the resection site and sent as a separate specimen.  All visible tumor was removed at this point.  The resectoscope was then  reinserted and loop cautery was used to achieve hemostasis.  The bladder  was emptied and then reinspected and hemostasis continued to be  excellent.  The bladder was then again emptied and a pelvic exam under  anesthesia was performed.  Due to the patient's body habitus, this was  very difficult to assess.  No obvious pelvic mass was  appreciated.  The  prostate was noted to be approximately 50 grams and without nodularity  or induration.  A 22-French three-way catheter was then placed into the  bladder and 40 mg of mitomycin in 40 mL of sterile water was then  instilled into the bladder and will be left indwelling for 1 hour.  Based on the patient's anemia prior to surgery, it was decided to admit  him to the hospital overnight due to the  extensive resection.  He was  transferred to the recovery unit in satisfactory condition after  extubation.      Heloise Purpura, MD  Electronically Signed     LB/MEDQ  D:  01/22/2009  T:  01/22/2009  Job:  [id number]

## 2011-04-08 NOTE — Discharge Summary (Signed)
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   SECONDARY DIAGNOSES:  1. Iron deficiency.  2. Hypertension.  3. Obstructive sleep apnea.  4. Hyperlipidemia.  5. Tobacco abuse.  6. Nonalcoholic steatohepatitis.  7. Erectile dysfunction.  8. Type 1 diabetes mellitus well controlled.  9. Obesity.   HOSPITAL COURSE AND PROBLEMS:  1. Gastrointestinal bleed.  The patient is a 55 year old male with a      chief complaint of bright red blood per rectum.  The patient was      admitted through the ER to our service.  The patient was seen by      GI.  He underwent colonoscopy, they discovered internal      hemorrhoids.  He is being discharged to home with Anusol.  He is      now stable.  2. Anemia secondary to chronic iron deficiency.  The patient is      receiving IV iron as an outpatient.  He does not tolerate p.o. iron      and is followed by his gastroenterologist for this.  3. Hypertension, currently stable.  4. Hyperlipidemia.  The patient will continue statin therapy and fish      oil.  5. Tobacco abuse, currently stable.  6. Nonalcoholic steatohepatitis, this is stable as well.  The patient      has been counseled with respect to the alcohol consumption.  7. Adrenal mass.  The patient is scheduled for resection.  We will      continue spironolactone  8. Type 1 diabetes mellitus.  The patient has had problems in the past      with fluid retention.  He was on Lasix and spirolactone upon      admission; however, he was also on metformin and Avandia and was      found to be in acute renal failure and  chronic renal insufficiency.      The patient has had his Lasix discontinued.  We will continue      spironolactone in the face of his adrenal mass discontinuing his      supplemental potassium.  We have also discontinued his p.o.      diabetes medications secondary to his large use of 70/30.  9. Obesity.  This is stable.   LABORATORY DATA OF NOTE:  1. Anemia with hemoglobin of 9.5, hematocrit 29.7, and MCV of 77.  2. Initial hyperkalemia with potassium of 5.4 decreasing to 4.9.  3. Initial renal failure with BUN 30, creatinine 2.4.  This is now      decreased to BUN of 12, creatinine 1.14.  4. AST 103.  5. Mild hypoalbuminemia with an  albumin of 3.4.  6. Hemoglobin A1c of 6.4.  7. TSH of 2.813.   STUDIES.:  1. Acute abdominal series showing borderline cardiomegaly with no      congestive heart failure or active disease.  2. Colonoscopy by GI showing hemorrhoids.   MEDICATIONS ON DISCHARGE.:  1. Anusol 1 q.12 x14 days.  2. Lisinopril 40 mg p.o. daily.  3. Omeprazole, which was discontinued.  4. Metformin, which is discontinued.  5. Atenolol 100 mg p.o. daily.  6. Simvastatin 20 mg p.o. daily.  7. Gabapentin 600 mg p.o. nightly.  8. Lasix, which has been discontinued.  9. Glyburide, which has been discontinued.  10.Klor-Con, which has been discontinued.  11.Viagra 100 mg p.o. q.8 h. p.r.n. activity.  12.NovoLog 70/30, 90 units subcu q.a.m. and 70 units subcu dinner.  13.Multivitamin 1 p.o. daily.  14.Fish oil 1 g p.o. b.i.d.  15.Spironolactone 25 mg p.o. daily.  16.Mirapex 0.5 mg p.o. nightly.  17.Avandia, which has been discontinued.  18.Trazodone 100 mg p.o. nightly.   There are no labs or studies pending at the time of discharge.  The  patient is in stable condition and anxious for discharge.   TIME SPENT:  35 minutes.      [name] Ramus, MD  Electronically Signed     JF/MEDQ  D:  05/09/2009  T:  05/10/2009  Job:  [id number]   cc:   Erle Crocker, M.D.

## 2011-04-08 NOTE — Op Note (Signed)
NAME:  Kyle [name], Kyle [name] NO.:  [id number]   MEDICAL RECORD NO.:  [id number]          PATIENT TYPE:  INP   LOCATION:  1445                         [facility]ACILITY[facility]   PHYSICIAN:  Heloise Purpura, MD      DATE OF BIRTH:  1956/01/24   DATE OF PROCEDURE:  06/04/2009  DATE OF DISCHARGE:                               OPERATIVE REPORT   PREOPERATIVE DIAGNOSIS:  Left adrenal mass.   POSTOPERATIVE DIAGNOSIS:  Left adrenal mass.   PROCEDURE:  Left laparoscopic adrenalectomy.   SURGEON:  Dr. Heloise Purpura.   ASSISTANT:  Dr. Marcine Matar.   ANESTHESIA:  General.   COMPLICATIONS:  None.   ESTIMATED BLOOD LOSS:  100 mL.   INTRAVENOUS FLUIDS:  2700 mL of lactated Ringer's.   SPECIMEN:  Left adrenal mass.   DISPOSITION OF SPECIMEN:  To pathology.   DRAINS:  None.   INDICATION:  [name] is a 55 year old gentleman who was incidentally  found to have a 5 cm left adrenal mass on CT imaging.  This mass had  characteristics on MR imaging most consistent with the adrenal adenoma.  However, based on the size of this lesion, options were discussed and  was decided to proceed with surgical removal.  He underwent a  biochemical evaluation preoperatively which was negative for any hyper  secreting lesion of the adrenal cortex or medulla.  His metastatic  evaluation was negative.  He does have multiple medical comorbidities  and underwent an extensive preoperative evaluation for anemia,  splenomegaly, and GI bleeding.  Once he was cleared from all of these  medical issues and was felt to be optimized from a cardiac standpoint,  he elected to proceed with the above procedure.  The potential risks,  complications, and alternative options were discussed in detail.  Informed consent was obtained.   DESCRIPTION OF PROCEDURE:  The patient was taken to the operating room  and a general anesthetic was administered.  He was given preoperative  antibiotics, placed in the left  modified flank position, and prepped and  draped in the usual sterile fashion.  Next preoperative time-out was  performed.  A site was selected superior and to the left side of the  umbilicus for placement of the camera port.  This site was chosen due to  the patient's abdominal obesity.  This port was then placed utilizing a  standard open Hasson technique.  The anterior rectus fascia was  identified and divided and the underlying rectus muscle fibers were  swept laterally to either side thereby exposing the posterior rectus  sheath which was then divided sharply allowing entry into the peritoneal  cavity under direct vision without difficulty.  Zero Vicryl holding  sutures were placed into the abdominal wall fascia and a 12-mm Hasson  cannula was placed.  A pneumoperitoneum was then established and  utilizing the 30 degrees lens, the abdomen was inspected.  Despite a  pneumoperitoneum of 15 mmHg, there was noted to be very little space in  the abdomen due to the patient's abdominal wall anatomy.  The additional  port sites  were then placed with a 5 mm port placed in the left upper  quadrant and a 12 mm port placed in the left lateral abdominal wall  approximately at the level of the umbilicus.  These ports were placed  under direct vision without difficulty.  The patient was noted to have  splenomegaly and there were noted to be some varices in the left upper  quadrant.  The white line of Toldt was then incised allowing the colon  be mobilized medially and exposing Gerota's fascia overlying the kidney.  Gerota's fascia was entered just superior to the kidney, allowing  exposure of the renal capsule.  The plane between the spleen and  Gerota's fascia was also developed thereby exposing the adrenal fossa.  It was felt that additional retraction would be needed and therefore two  additional 5-mm ports were placed with one in far left lateral abdominal  wall and one in the left lower  quadrant.  With additional retraction,the  adrenal fossa was exposed and dissection continued.  The adrenal vein  was identified and was able to be isolated with right-angle dissection.  It was divided between multiple 5-mm Hem-o-lok clips.  During this  portion of the procedure, the patient was not noted to have any change  in his blood pressure or hemodynamic parameters.  The dissection then  continued along the lateral aspect of the adrenal gland between the  superior pole of the kidney and the adrenal gland itself with a Harmonic  scalpel used to divide this tissue.  The adrenal gland was then lifted  anteriorly and the arterial blood flow to the adrenal gland was then  ligated with a combination of Hem-o-lok clips and ultrasonic energy.  Once the adrenal gland was completely mobilized, the superior  attachments to the spleen were carefully taken down and the adrenal  gland was placed into an Endopouch retrieval bag.  The adrenal fossa was  then carefully examined.  There appeared to be good hemostasis.  For  precautionary measures, 5 mL of FloSeal was placed into the adrenal bed  and a small piece of Surgicel was placed over the FloSeal.  The spleen  and colon were then placed back into their normal anatomic positions  after there was felt to be good hemostasis even with a pneumoperitoneum  let down.  The left lateral 12 mm port site was then closed with a 0-0  Vicryl suture placed with the aid of the suture passer device  laparoscopically.  All remaining ports were then removed under direct  vision and the adrenal specimen was removed intact within the Endopouch  retrieval bag via the [place] camera port site.  This fascial opening was  then closed with a running 0 Vicryl suture.  All port sites were  injected with 0.25% Marcaine and reapproximated at the skin level with  staples.  Sterile dressings were applied.  The patient appeared to  tolerate the procedure well without  complications.  He was able to be  awakened and transferred to recovery unit in satisfactory condition.      Heloise Purpura, MD  Electronically Signed     LB/MEDQ  D:  06/04/2009  T:  06/04/2009  Job:  [id number]

## 2011-04-08 NOTE — Consult Note (Signed)
NAME:  Kyle [name], Kyle [name] NO.:  [id number]   MEDICAL RECORD NO.:  [id number]          PATIENT TYPE:  INP   LOCATION:  A320                          FACILITY:  [facility]   PHYSICIAN:  Kassie Mends, M.D.      DATE OF BIRTH:  May 11, 1956   DATE OF CONSULTATION:  05/07/2009  DATE OF DISCHARGE:                                 CONSULTATION   REFERRING PHYSICIAN:  Elliot Cousin, M.D.   REASON FOR CONSULTATION:  Rectal bleeding.   HISTORY OF PRESENT ILLNESS:  Mr. 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  He also has a history of B12 deficiency.  He has been undergoing workup for splenomegaly and thrombocytopenia  which is likely secondary to portal hypertension.  He recently had a  liver biopsy and hepatic venous wedge pressures performed.  He presented  today with five episodes of rectal bleeding.  He got up at 3 a.m. this  morning to go to the bathroom and it was all blood.  He denies any  nausea, vomiting or problems swallowing.  He has no heartburn,  indigestion, abdominal pain or diarrhea.  His last bowel movement was  approximately 1 hour ago.  He has no black tarry stools.  He did take  aspirin because of shoulder pain.  He denies consuming any BC's, Goodys,  ibuprofen, Motrin or Aleve.   PAST MEDICAL HISTORY:  1. Diabetes complicated by neuropathy.  2. Restless leg syndrome.  3. Hypertension.  4  Hyperlipidemia.  1. Depression.  2. GERD.  3. Lower extremity edema.  4. Sleep apnea requiring CPAP (3).  5. COPD.  6. Bladder cancer, status post resection.  7. Adrenal mass.   PAST SURGICAL HISTORY:  TURP in March 2010.   ALLERGIES:  NO KNOWN DRUG ALLERGIES.   MEDICATIONS:  1. Lisinopril 40 mg once daily.  2. Aldactone 25 mg once a day.  3. Lasix 40 mg twice a day.  4. Trazodone 100 mg nightly.  5. Metformin 1 gram b.i.d.  6. Mirapex.  7. Omeprazole 40 mg daily.  8. Klor-Con  9. Hydrocodone/acetaminophen 5/500.  10.Simvastatin.  11.NovoLog 70/30 at 90 units in the morning and 70 units at bedtime.  12.Neurontin 600 mg nightly.  13.Atenolol 100 mg daily.  14.Glipizide 10 mg twice a day.  15.Avandia 8 mg every morning.  16.Fish oil.  17.Multivitamin.   FAMILY HISTORY:  He denies any family history of colon cancer or colon  polyps.  His father had alcoholic cirrhosis.   SOCIAL HISTORY:  He is married and has three children.  His youngest is  68.  He denies any regular alcohol use since his 30s.  He denies any use  of marijuana.   REVIEW OF SYSTEMS:  Per the HPI, otherwise all systems are negative.  No  swelling in his legs or his abdomen.  His appetite is pretty good.  He  had an attempted colonoscopy with propofol in 2010, but it was  incomplete due to a poor bowel  prep.   PHYSICAL EXAMINATION:  VITAL SIGNS:  Temperature 97.9, blood pressure  121/67, pulse 84, respiratory rate 20.  O2 saturation 92% on room air.  GENERAL:  He is in no apparent distress.  Alert and oriented x4.  HEENT:  Atraumatic, normocephalic.  Pupils equal and react to light.  Mouth, no  oral lesions.  Posterior pharynx without erythema or exudate.  NECK:  Full range of motion.  No lymphadenopathy.  He has a bandage in place on  his right neck.  LUNGS:  Clear to auscultation bilaterally.  CARDIOVASCULAR:  Shows a regular rhythm, no murmur.  Normal S1-  S2.ABDOMEN:  Bowel sounds are present.  Soft, nontender, nondistended.  No rebound or guarding.  Obese.  EXTREMITIES:  No cyanosis or edema.  He  has bilateral lower extremity hyperpigmentation.  NEURO:  He has no focal neurologic deficits.   LABORATORY DATA:  Potassium 5.4, creatinine 1.31, white count 4.1,  hemoglobin 10.1, platelets 196.   RADIOGRAPHIC STUDIES:  Acute abdominal series showed no acute intra-  abdominal pathology.   ASSESSMENT:  Mr. Pressnell is a 55 year old male who presented with  painless rectal  bleeding.  It is most likely secondary to  diverticulosis.  The differential diagnoses includes rectal varices,  colorectal polyps, colorectal malignancy, low likelihood of AV  malformations or hemobilia.  He has recently had an incomplete  colonoscopy with an outpatient bowel prep.   Thank you for allowing me to see Mr. Corro in consultation.  My  recommendations follow.   RECOMMENDATIONS:  1. Will begin to prep him for colonoscopy on Wednesday with propofol.      He will have clear liquids, GoLYTELY and Dulcolax night.  He will      have a tap water enema tomorrow.  We will also repeat GoLYTELY.  2. Will adjust the medications giving him half of the lisinopril.  He      can continue the Aldactone and Lasix.  Continue the metformin.      Protonix b.i.d.  Change the atenolol to Lopressor 12.5 mg every 6      hours.  Hold the      glipizide and the Avandia.  We will give him half of his regular      NovoLog dosing and he may use an insulin sliding scale.  3. Will obtain the results of the liver biopsy in the hepatic venous      wedge pressure.      Kassie Mends, M.D.  Electronically Signed     SM/MEDQ  D:  05/07/2009  T:  05/08/2009  Job:  [id number]   cc:   Kirk Ruths, M.D.  Fax: [phone]

## 2011-04-08 NOTE — Letter (Signed)
October 13, 2008    Erle Crocker, MD  [phone], [address]  [place], [place]  [address]   RE:  Kyle [name], Kyle [name]  MRN:  [id number]  /  DOB:  1956-04-27   Dear [name],   It was my pleasure evaluating Mr. Fulco today in the office in  consultation at your request for syncope.  Mr. Carneiro describes episodes  of loss of consciousness for years.  Some of these sound more like  absence attacks where he loses consciousness of his surroundings, but  does not fall to the ground.  Others do sound like complete loss of  consciousness.  He has not been concerned about these in the past, but  now is due to their increasing frequency.  He generally does not sustain  injury, but has had a few abrasions in recent months.  At times, he  notes a sense of presyncope.  He will sit down and not fall or lose  consciousness.  Some of these events have been related to change in body  position, some of it occurred with cough.  Other have no apparent  precipitant.  He has not previously seen a cardiologist nor undergone  any significant cardiac testing.  He indicates that he was evaluated in  the emergency department for loss of consciousness, but I cannot find a  record of this.  Recent CT scan of the head and carotid Dopplers are  said to be normal.   PAST MEDICAL HISTORY:  1. Diabetes requiring insulin.  2. Hypertension.  3. Hyperlipidemia.   He has symptoms of sleep apnea and recently underwent a sleep study, the  results of which are not yet available.   No known allergy.   CURRENT MEDICATIONS:  1. Lisinopril 40 mg daily.  2. Omeprazole 40 mg daily.  3. Metformin 1000 mg b.i.d.  4. Atenolol 100 mg daily.  5. Simvastatin 20 mg daily.  6. Gabapentin 600 mg nightly.  7. Furosemide 40 mg daily.  8. Glipizide 5 mg b.i.d.  9. KCl 10 mEq daily.  10.NovoLog insulin 50 units b.i.d.  11.Levemir insulin 70 units nightly.  12.Aspirin 162 mg daily.  13.Fish oil 1000 mg b.i.d.  14.Viagra  p.r.n.   SOCIAL HISTORY:  Unemployed; separated from his wife and lives with his  parents; has 3 children; 40 pack-year history of cigarette smoking,  continuing of 1-1/2 packs per day.  No excessive use of alcohol.   FAMILY HISTORY:  Father died at age 29 due to cirrhosis related to  alcohol abuse.  Mother died at age 18 with diabetes and kidney cancer.  Brother has HIV.   REVIEW OF SYSTEMS:  Requires corrective lenses; occasional mild  palpitations; GERD symptoms; urinary frequency; diffuse arthritic pain;  and intermittent edema.  All other systems reviewed and are negative.   PHYSICAL EXAMINATION:  VITAL SIGNS:  The weight is 255 pounds.  Blood  pressure 130/80, heart rate 80 and regular, without orthostatic change,  and respirations 14.  HEENT:  Anicteric sclerae; normal lids and conjunctivae; EOMs normal;  normal oral mucosa.  NECK:  No jugular vein distention; normal carotid upstrokes without  bruits.  ENDOCRINE:  No thyromegaly.  LYMPHATIC:  No adenopathy.  LUNGS:  Clear.  CARDIAC:  Normal first and second heart sounds; normal PMI.  ABDOMEN:  Soft and nontender; no organomegaly.  EXTREMITIES:  No edema; normal distal pulses.   EKG:  Normal sinus rhythm; borderline left atrial abnormality;  borderline delayed R-wave progression; RSR prime;  leftward axis.  Laboratory notable for a normal CBC, normal chemistry profile except for  glucose of 226 and mildly elevated SGOT and SGPT and a reasonable lipid  profile with total cholesterol 172, triglycerides 271, HDL 38, and LDL  79.  Spirometry showed no major ventilatory defect.   IMPRESSION:  Mr. Reinhardt presents with multiple episodes of loss of  consciousness and dizziness, but not much in the way of physical injury.  We will rule out structural heart disease with an echocardiogram.  I  will provide him an event recorder to monitor the frequency of symptoms  over the next 3 weeks as well as to exclude arrhythmia as the cause  of  his problems.  After the monitoring interval has been completed, we will  reassess Mr. Wingrove and decide which, if any, additional testing is  warranted.   Thank so much for sending this nice gentleman to me.    Sincerely,      Gerrit Friends. Dietrich Pates, MD, [facility]  Electronically Signed    RMR/MedQ  DD: 10/13/2008  DT: 10/14/2008  Job #: [id number]

## 2011-04-11 ENCOUNTER — Encounter: Payer: Self-pay | Admitting: Gastroenterology

## 2011-04-11 ENCOUNTER — Other Ambulatory Visit (HOSPITAL_COMMUNITY): Payer: Self-pay | Admitting: Oncology

## 2011-04-11 ENCOUNTER — Encounter (HOSPITAL_COMMUNITY): Payer: Medicaid Other

## 2011-04-11 DIAGNOSIS — D649 Anemia, unspecified: Secondary | ICD-10-CM

## 2011-04-11 LAB — CBC
Hemoglobin: 12.2 g/dL — ABNORMAL LOW (ref 13.0–17.0)
MCH: 26.6 pg (ref 26.0–34.0)
MCHC: 29.1 g/dL — ABNORMAL LOW (ref 30.0–36.0)
Platelets: 96 10*3/uL — ABNORMAL LOW (ref 150–400)

## 2011-04-11 NOTE — Progress Notes (Unsigned)
Discussed abd u/s with Dr. Jena Gauss. No CT needed right now. Recommends abd u/s in six months (h/o cirrhosis, hepatoma surveillance). See original abd u/s result for details.

## 2011-04-14 ENCOUNTER — Encounter (HOSPITAL_COMMUNITY): Payer: Medicaid Other | Admitting: Oncology

## 2011-04-14 DIAGNOSIS — D509 Iron deficiency anemia, unspecified: Secondary | ICD-10-CM

## 2011-04-14 NOTE — Progress Notes (Signed)
Pt aware. Please nic

## 2011-04-14 NOTE — Op Note (Signed)
NAME:  MARLIN, JARRARD NO.:  [id number]  MEDICAL RECORD NO.:  [id number]           PATIENT TYPE:  O  LOCATION:  DAYP                          FACILITY:  [facility]  PHYSICIAN:  R. Roetta Sessions, M.D. DATE OF BIRTH:  06-Apr-1956  DATE OF PROCEDURE:  04/08/2011 DATE OF DISCHARGE:                              OPERATIVE REPORT   PROCEDURE:  EGD with gastric and duodenal biopsy followed by ileocolonoscopy with snare polypectomy.  INDICATIONS FOR PROCEDURE:  A 55 year old gentleman with state of appetite/cirrhosis, persistent iron deficiency anemia comes for EGD and colonoscopy.  Risks, benefits, limitations, alternatives, imponderables have been discussed, questions answered.  Please see the documentation in the medical record.  PROCEDURE NOTE:  O2 saturation, blood pressure, pulse, respirations were monitored throughout the entirety of both procedures.  CONSCIOUS SEDATION:  Versed 4 mg IV, Demerol 75 mg IV in divided doses. INSTRUMENT:  Pentax video chip system.  Cetacaine spray for topical pharyngeal anesthesia.  FINDINGS:  EGD.  Examination of tubular esophagus revealed 4 columns of grade 2 esophageal varices without bleeding stigmata, accentuated undulating Z-line.  EG junction easily traversed.  Stomach:  Gastric cavity was empty and insufflated well with air. Thorough examination of gastric mucosa including retroflexion of proximal stomach, esophagogastric junction demonstrated snake skinning, fish scale appearance of gastric mucosa consistent with portal gastropathy.  There was no ulcer infiltrating process.  Pylorus was patent, easily traversed.  Examination of bulb and second portion revealed no abnormalities.  THERAPEUTIC/DIAGNOSTIC MANEUVERS PERFORMED:  Biopsies of D2 and the gastric mucosa were taken for histologic study.  The patient tolerated the procedure well and was prepared for colonoscopy.  Digital rectal exam revealed no abnormalities.   Endoscopic findings:  Prep was marginal to poor which made exam more difficult.  Colon:  Colonic mucosa was surveyed from the rectosigmoid junction through the left transverse right colon to the appendiceal orifice, ileocecal valve/cecum.  These structures were seen and photographed for the record.  Terminal ileum was intubated to 5 cm.  From this level, scope was slowly and cautiously withdrawn.  All previously mentioned mucosal surfaces were again seen on the way in and to quite a bit of time washing and lavaging, tenacious, viscous stool lining of the colon, still it was difficult to see all the fine mucosal detail because of the marginal prep.  There was a 5-mm polyp in the splenic flexure which was hot snared and removed.  The remainder of colonic mucosa appeared grossly normal, otherwise small subtle flat lesion may not have been seen because of the prep.  The scope was pulled down into the rectum where a thorough examination of rectal mucosa including retroflexed view of the anal verge demonstrated no abnormalities.  The terminal ileal mucosa appeared normal.  The patient tolerated this procedure very well.  Cecal withdrawal time 9 minutes.  IMPRESSION: 1. EGD, 4 columns grade 2 esophageal varices without bleeding     stigmata. 2. Diffuse gastric mucosal changes consistent with portal gastropathy     status post biopsy normal D1 and D2 status post biopsy D2.  COLONOSCOPY FINDINGS:  Poor prep  made exam more difficult.  Normal rectum polyp and splenic flexure status post hot snare polypectomy. Remaining colonic mucosa appeared grossly normal, but poor prep made the exam more difficult.  Normal terminal ileum.  RECOMMENDATIONS: 1. Follow up on pending studies. 2. Patient opted on nonselective beta-blocker i.e. nadolol or Inderal     to see if we can switch his atenolol to one of these nonspecific     agents to prophylax against variceal bleeding, we will communicate     with  Dr. Margo Aye. 3. Thought to have a repeat EGD in approximately 18 months' size of     varices at that time. 4. Further recommendations to follow.     [name], M.D.     RMR/MEDQ  D:  04/08/2011  T:  04/08/2011  Job:  [id number]  cc:   Isac Sarna, MD [place], [facility]  Electronically Signed by Lorrin Goodell M.D. on 04/14/2011 10:04:51 AM

## 2011-04-15 ENCOUNTER — Encounter: Payer: Self-pay | Admitting: Internal Medicine

## 2011-04-16 ENCOUNTER — Encounter: Payer: Self-pay | Admitting: Internal Medicine

## 2011-04-17 ENCOUNTER — Encounter: Payer: Self-pay | Admitting: Gastroenterology

## 2011-04-17 NOTE — Progress Notes (Unsigned)
   Pt on atenolol currently. D/W Dr. Jena Gauss. Please contact Dr. Margo Aye (or cc recent EGD report and this note) to see about switching to a non-specific beta-blocker due to esophageal varices finding on EGD.   Thanks!

## 2011-04-22 ENCOUNTER — Other Ambulatory Visit: Payer: Self-pay | Admitting: Ophthalmology

## 2011-04-22 ENCOUNTER — Encounter (HOSPITAL_COMMUNITY): Payer: Medicaid Other

## 2011-04-22 LAB — BASIC METABOLIC PANEL
BUN: 16 mg/dL (ref 6–23)
Chloride: 100 mEq/L (ref 96–112)
Creatinine, Ser: 1.21 mg/dL (ref 0.4–1.5)
GFR calc non Af Amer: >60 mL/min (ref >60)
Glucose, Bld: 134 mg/dL — ABNORMAL HIGH (ref 70–99)
Potassium: 4.6 mEq/L (ref 3.5–5.1)

## 2011-04-24 ENCOUNTER — Ambulatory Visit (HOSPITAL_COMMUNITY)
Admission: RE | Admit: 2011-04-24 | Discharge: 2011-04-24 | Disposition: A | Discharge status: Home / Self Care | Payer: Medicaid Other | Source: Ambulatory Visit | Attending: Ophthalmology | Admitting: Ophthalmology

## 2011-04-24 DIAGNOSIS — Z79899 Other long term (current) drug therapy: Secondary | ICD-10-CM | POA: Insufficient documentation

## 2011-04-24 DIAGNOSIS — E119 Type 2 diabetes mellitus without complications: Secondary | ICD-10-CM | POA: Insufficient documentation

## 2011-04-24 DIAGNOSIS — G4733 Obstructive sleep apnea (adult) (pediatric): Secondary | ICD-10-CM | POA: Insufficient documentation

## 2011-04-24 DIAGNOSIS — Z794 Long term (current) use of insulin: Secondary | ICD-10-CM | POA: Insufficient documentation

## 2011-04-24 DIAGNOSIS — H25049 Posterior subcapsular polar age-related cataract, unspecified eye: Secondary | ICD-10-CM | POA: Insufficient documentation

## 2011-04-24 DIAGNOSIS — I10 Essential (primary) hypertension: Secondary | ICD-10-CM | POA: Insufficient documentation

## 2011-04-25 NOTE — Progress Notes (Signed)
Faxed note and copy of egd to Dr. Margo Aye.

## 2011-04-28 ENCOUNTER — Other Ambulatory Visit (HOSPITAL_COMMUNITY): Payer: Self-pay

## 2011-05-05 NOTE — Op Note (Signed)
  NAME:  Kyle [name], ABER NO.:  [id number]  MEDICAL RECORD NO.:  [id number]           PATIENT TYPE:  O  LOCATION:  DAYP                          FACILITY:  [facility]  PHYSICIAN:  Susanne Greenhouse, MD       DATE OF BIRTH:  04-01-1956  DATE OF PROCEDURE:  04/24/2011 DATE OF DISCHARGE:                              OPERATIVE REPORT   PREOPERATIVE DIAGNOSIS:  Posterior subcapsular cataract left eye, diagnosis code 366.14.  POSTOPERATIVE DIAGNOSIS:  Posterior subcapsular cataract left eye, diagnosis code 366.14.  DESCRIPTION OF PROCEDURE:  *********AWAITING CATARACT, LEFT EYE, FROM DR. Tyronn Golda*********  Prosthetic device used is a Lenstec posterior chamber lens, model Softec HD, power of 23.25, serial [id number].          ______________________________ Susanne Greenhouse, MD     KEH/MEDQ  D:  04/24/2011  T:  04/25/2011  Job:  [id number]  Electronically Signed by Gemma Payor MD on 05/05/2011 12:22:29 PM

## 2011-05-09 ENCOUNTER — Encounter (HOSPITAL_COMMUNITY): Payer: Medicaid Other | Attending: Oncology

## 2011-05-09 ENCOUNTER — Other Ambulatory Visit (HOSPITAL_COMMUNITY): Payer: Self-pay | Admitting: Oncology

## 2011-05-09 DIAGNOSIS — D509 Iron deficiency anemia, unspecified: Secondary | ICD-10-CM | POA: Insufficient documentation

## 2011-05-09 LAB — CBC
HCT: 40 % (ref 39.0–52.0)
MCV: 89.1 fL (ref 78.0–100.0)
RBC: 4.49 MIL/uL (ref 4.22–5.81)
WBC: 4.3 10*3/uL (ref 4.0–10.5)

## 2011-05-13 ENCOUNTER — Encounter (HOSPITAL_COMMUNITY): Payer: Self-pay | Admitting: Oncology

## 2011-05-13 ENCOUNTER — Other Ambulatory Visit (HOSPITAL_COMMUNITY): Payer: Self-pay | Admitting: Oncology

## 2011-05-13 DIAGNOSIS — D509 Iron deficiency anemia, unspecified: Secondary | ICD-10-CM

## 2011-05-13 DIAGNOSIS — D6959 Other secondary thrombocytopenia: Secondary | ICD-10-CM

## 2011-05-13 DIAGNOSIS — C679 Malignant neoplasm of bladder, unspecified: Secondary | ICD-10-CM

## 2011-05-13 DIAGNOSIS — F1011 Alcohol abuse, in remission: Secondary | ICD-10-CM

## 2011-05-13 DIAGNOSIS — D6489 Other specified anemias: Secondary | ICD-10-CM | POA: Insufficient documentation

## 2011-05-13 DIAGNOSIS — D731 Hypersplenism: Secondary | ICD-10-CM

## 2011-05-13 DIAGNOSIS — D518 Other vitamin B12 deficiency anemias: Secondary | ICD-10-CM

## 2011-05-13 HISTORY — DX: Alcohol abuse, in remission: F10.11

## 2011-05-13 HISTORY — DX: Other secondary thrombocytopenia: D69.59

## 2011-05-13 HISTORY — DX: Other specified anemias: D64.89

## 2011-05-13 HISTORY — DX: Hypersplenism: D73.1

## 2011-05-13 NOTE — Assessment & Plan Note (Signed)
No Parietal or Intrinsic Factor Antibodies

## 2011-05-13 NOTE — Assessment & Plan Note (Signed)
Urothelial carcinoma of bladder  Presenting in Feb 2012 to Dr. Heloise Purpura  S/P Transurethral resection of bladder tumor on 01/31/2010 which revealed a low grade Ta urothelial carcinoma.  S/P bladder biopsy with a small recurrence in August 2011 that revealed a low grade Ta Urothelial carcinoma

## 2011-05-13 NOTE — Assessment & Plan Note (Signed)
Requiring IV Feraheme PRN

## 2011-05-14 ENCOUNTER — Other Ambulatory Visit (HOSPITAL_COMMUNITY): Payer: Medicaid Other

## 2011-05-14 ENCOUNTER — Encounter (HOSPITAL_COMMUNITY): Payer: Medicaid Other

## 2011-05-14 DIAGNOSIS — D509 Iron deficiency anemia, unspecified: Secondary | ICD-10-CM

## 2011-05-19 ENCOUNTER — Ambulatory Visit (HOSPITAL_COMMUNITY)
Admission: RE | Admit: 2011-05-19 | Discharge: 2011-05-19 | Disposition: A | Discharge status: Home / Self Care | Payer: Medicaid Other | Source: Ambulatory Visit | Attending: Ophthalmology | Admitting: Ophthalmology

## 2011-05-19 DIAGNOSIS — H25049 Posterior subcapsular polar age-related cataract, unspecified eye: Secondary | ICD-10-CM | POA: Insufficient documentation

## 2011-05-19 DIAGNOSIS — I10 Essential (primary) hypertension: Secondary | ICD-10-CM | POA: Insufficient documentation

## 2011-05-19 DIAGNOSIS — E119 Type 2 diabetes mellitus without complications: Secondary | ICD-10-CM | POA: Insufficient documentation

## 2011-05-19 DIAGNOSIS — Z01812 Encounter for preprocedural laboratory examination: Secondary | ICD-10-CM | POA: Insufficient documentation

## 2011-05-19 DIAGNOSIS — Z79899 Other long term (current) drug therapy: Secondary | ICD-10-CM | POA: Insufficient documentation

## 2011-05-19 DIAGNOSIS — Z794 Long term (current) use of insulin: Secondary | ICD-10-CM | POA: Insufficient documentation

## 2011-06-02 ENCOUNTER — Other Ambulatory Visit (HOSPITAL_COMMUNITY): Payer: Self-pay | Admitting: Oncology

## 2011-06-02 NOTE — Op Note (Signed)
  NAME:  Kyle [name], Kyle [name] NO.:  [id number]  MEDICAL RECORD NO.:  [id number]  LOCATION:  DAYP                          FACILITY:  [facility]  PHYSICIAN:  Susanne Greenhouse, MD       DATE OF BIRTH:  Aug 28, 1956  DATE OF PROCEDURE:  05/19/2011 DATE OF DISCHARGE:  05/19/2011                              OPERATIVE REPORT   PREOPERATIVE DIAGNOSIS:  Posterior subcapsular cataract, right eye.  POSTOPERATIVE DIAGNOSIS:  Posterior subcapsular cataract, right eye.  DIAGNOSIS CODE:  366.14.  PROCEDURE PERFORMED:  Phacoemulsification with intraocular lens implantation, right eye.  SURGEON:  Susanne Greenhouse, MD  DESCRIPTION OF OPERATION:  In the preoperative holding area, dilating drops and viscous lidocaine were placed into the left eye.  The patient was then brought to the operating room where he was prepped and draped. Beginning with a 75-blade, a paracentesis port was made at the surgeon's 2 o'clock position.  The anterior chamber was then filled with a 1% nonpreserved lidocaine solution.  This was followed by filling the anterior chamber with Provisc.  The 2.4-mm keratome blade was then used to make a clear corneal incision at the temporal limbus.  A bent cystotome needle and Utrata forceps were used to create a continuous tear capsulotomy.  Hydrodissection was performed with balanced salt solution on a fine cannula.  The lens nucleus was then removed using phacoemulsification in a quadrant cracking technique.  Residual cortex was removed with irrigation and aspiration.  The capsular bag and anterior chamber were then refilled with Provisc and a posterior chamber intraocular lens was placed into the capsular bag without difficulty using a lens injecting system.  The Provisc was removed from the capsular bag and anterior chamber with irrigation and aspiration. Stromal hydration of the main incision and paracentesis ports was performed with balanced salt solution on a fine cannula.   The wounds were tested for leak, which were negative.  The patient tolerated the procedure well.  There were no operative complications and he was returned to the recovery area in satisfactory condition.  No surgical specimens.  PROSTHETIC DEVICE USED:  A Lenstec posterior chamber ocular lens, model Softec HD, power of 23.0, serial number is [id number].          ______________________________ Susanne Greenhouse, MD     KEH/MEDQ  D:  05/19/2011  T:  05/19/2011  Job:  [id number]  Electronically Signed by [name] Dimmer Jaydeen Darley MD on 06/02/2011 12:43:08 PM

## 2011-06-06 ENCOUNTER — Encounter (HOSPITAL_COMMUNITY): Payer: Medicaid Other | Attending: Oncology

## 2011-06-06 ENCOUNTER — Other Ambulatory Visit (HOSPITAL_COMMUNITY): Payer: Self-pay | Admitting: Oncology

## 2011-06-06 DIAGNOSIS — D509 Iron deficiency anemia, unspecified: Secondary | ICD-10-CM

## 2011-06-06 LAB — CBC
Hemoglobin: 13.8 g/dL (ref 13.0–17.0)
Platelets: 81 10*3/uL — ABNORMAL LOW (ref 150–400)
RBC: 4.67 MIL/uL (ref 4.22–5.81)
WBC: 4.3 10*3/uL (ref 4.0–10.5)

## 2011-06-06 LAB — FERRITIN: Ferritin: 206 ng/mL (ref 22–322)

## 2011-06-09 ENCOUNTER — Other Ambulatory Visit (HOSPITAL_COMMUNITY): Payer: Self-pay | Admitting: Oncology

## 2011-06-27 NOTE — Progress Notes (Signed)
Reminder in epic for pt to repeat abd ultrasound in 6 months from May 2012 (Nov 2012)

## 2011-07-04 ENCOUNTER — Encounter (HOSPITAL_COMMUNITY): Payer: Medicaid Other | Attending: Oncology

## 2011-07-04 DIAGNOSIS — D649 Anemia, unspecified: Secondary | ICD-10-CM | POA: Insufficient documentation

## 2011-07-04 DIAGNOSIS — K7689 Other specified diseases of liver: Secondary | ICD-10-CM | POA: Insufficient documentation

## 2011-07-04 DIAGNOSIS — D509 Iron deficiency anemia, unspecified: Secondary | ICD-10-CM

## 2011-07-04 DIAGNOSIS — K7460 Unspecified cirrhosis of liver: Secondary | ICD-10-CM | POA: Insufficient documentation

## 2011-07-04 DIAGNOSIS — D6959 Other secondary thrombocytopenia: Secondary | ICD-10-CM | POA: Insufficient documentation

## 2011-07-04 DIAGNOSIS — C679 Malignant neoplasm of bladder, unspecified: Secondary | ICD-10-CM

## 2011-07-04 LAB — CBC
HCT: 42.7 % (ref 39.0–52.0)
Hemoglobin: 13.6 g/dL (ref 13.0–17.0)
RBC: 4.55 MIL/uL (ref 4.22–5.81)
WBC: 3.7 10*3/uL — ABNORMAL LOW (ref 4.0–10.5)

## 2011-07-04 NOTE — Progress Notes (Signed)
Labs drawn today for cbc,ferr 

## 2011-07-05 LAB — FERRITIN: Ferritin: 38 ng/mL (ref 22–322)

## 2011-07-07 ENCOUNTER — Ambulatory Visit (HOSPITAL_COMMUNITY): Payer: Medicaid Other | Admitting: Oncology

## 2011-07-07 ENCOUNTER — Encounter (HOSPITAL_BASED_OUTPATIENT_CLINIC_OR_DEPARTMENT_OTHER): Payer: Medicaid Other | Admitting: Oncology

## 2011-07-07 DIAGNOSIS — D6489 Other specified anemias: Secondary | ICD-10-CM

## 2011-07-07 DIAGNOSIS — D649 Anemia, unspecified: Secondary | ICD-10-CM

## 2011-07-07 DIAGNOSIS — K7689 Other specified diseases of liver: Secondary | ICD-10-CM

## 2011-07-07 DIAGNOSIS — D6959 Other secondary thrombocytopenia: Secondary | ICD-10-CM

## 2011-07-07 DIAGNOSIS — K7460 Unspecified cirrhosis of liver: Secondary | ICD-10-CM

## 2011-07-07 DIAGNOSIS — D509 Iron deficiency anemia, unspecified: Secondary | ICD-10-CM

## 2011-07-07 NOTE — Progress Notes (Signed)
HALL,[name], MD 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 Other chronic nonalcoholic liver disease  CBC, CBC, CBC, Ferritin, Ferritin, Ferritin, Comprehensive metabolic panel  3. HEPATIC CIRRHOSIS  CBC, CBC, CBC, Ferritin, Ferritin, Ferritin, Comprehensive metabolic panel  4. ANEMIA-IRON DEFICIENCY  CBC, CBC, CBC, Ferritin, Ferritin, Ferritin, Comprehensive metabolic panel  5. Anemia due to multiple mechanisms  CBC, CBC, CBC, Ferritin, Ferritin, Ferritin, Comprehensive metabolic panel    CURRENT THERAPY: Intermittent Feraheme infusion as needed.  He needs one in the near future for a ferritin on 38  INTERVAL HISTORY: Kyle [name] 55 y.o. male returns for  regular  visit for followup of Multifactorial anemia with a significant element of iron deficiency.    Today he reports that his energy level is low, but significantly improved compared to 3 months or more ago.  He reports back then it was difficult for him to get out of bed.  Now he is able to perform chores and duties such as washing his automobile, but then he is exhausted.   Spent some time with the patient going over why he continues to lose his iron.  This is likely due to his liver disease.  I explained that he has a number of reasons to be fatigued (ie iron deficiency, anemia, liver disease, obesity, and likely lung disease secondary to tobacco abuse).  He will continue to see Dr. Jena Gauss regarding his liver disease and problems.  We will continue to support his Hgb with Feraheme.  He continues to smoke 1 1/2 pack of cigarettes daily.  Past Medical History  Diagnosis Date  . DM (diabetes mellitus)   . Cirrhosis     bx proven steatohepatitis with cirrhosis (2010)  . HTN (hypertension)   . RLS (restless legs syndrome)   . Sleep apnea   . Hyperlipidemia   . IDA (iron deficiency anemia)   . GERD  (gastroesophageal reflux disease)   . Depression   . Peripheral neuropathy   . Urothelial cancer     2010, paillary low-grade, h/o recurrence 2011  . B12 deficiency   . Psoriasis   . 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 Anemia due to multiple mechanisms 05/13/2011  . History of alcohol abuse 05/13/2011  . ANEMIA-IRON DEFICIENCY 03/09/2009    has BLADDER CANCER; DIABETES MELLITUS, TYPE II, CONTROLLED, WITH COMPLICATIONS; ADRENAL MASS; HYPERLIPIDEMIA; ANEMIA-IRON DEFICIENCY; ANEMIA, VITAMIN B12 DEFICIENCY; SMOKER; DEPRESSION; SLEEP APNEA, OBSTRUCTIVE, MODERATE; RESTLESS LEG SYNDROME; HYPERTENSION; GERD; HEPATIC CIRRHOSIS; OTHER CHRONIC NONALCOHOLIC LIVER DISEASE; HEMOCCULT POSITIVE STOOL; GI BLEEDING; ARTHRITIS; SHOULDER PAIN, LEFT; INSOMNIA; FATIGUE; WEIGHT GAIN; CHEST DISCOMFORT; PROTEINURIA; ABNORMAL ELECTROCARDIOGRAM; Thrombocytopenia due to hypersplenism; Anemia due to multiple mechanisms; and History of alcohol abuse on his problem list.      has no known allergies.  [name] does not currently have medications on file.  Past Surgical History  Procedure Date  . Carpel tunnel   . Shoulder surgery   . Adrenal mass surgery 05/2009    benign, left  . Bladder surgery 01/2009 and 06/2010    cancer 2010, small recurrence in 06/2010  . Colonoscopy 04/2009    moderate int hemorrhoids, rare sigmoid diverticula, one mm sessile hyperplastic rectal polyp  . Esophagogastroduodenoscopy 03/2009    small hh  . Small bowel capsule endoscopy 03/2009    couple of small benign appearing erosions, nonbleeding  . Egd/tcs 08/2007    small hiatal  hernia, pancolonic diverticula, friable anal canal, 3cm salmon colored epithelium in distal esophagus, bx negative for Barrett's    Denies any headaches, dizziness, double vision, fevers, chills, night sweats, nausea, vomiting, diarrhea, constipation, chest pain, heart palpitations, shortness of breath, blood in stool, black tarry stool, urinary pain,  urinary burning, urinary frequency, hematuria.   PHYSICAL EXAMINATION  ECOG PERFORMANCE STATUS: 1 - Symptomatic but completely ambulatory  Filed Vitals:   07/07/11 1022  BP: 131/72  Pulse: 81  Temp: 97.3 F (36.3 C)    GENERAL:alert, no distress, well nourished, well developed, comfortable, cooperative and smiling SKIN: skin color, texture, turgor are normal, no rashes or significant lesions HEAD: Normocephalic, No masses, lesions, tenderness or abnormalities EYES: normal, PERRLA, EOMI EARS: External ears normal OROPHARYNX:mucous membranes are moist  NECK: supple, trachea midline LYMPH:  No epitrochlear nodes noted BREAST:not examined LUNGS: clear to auscultation and percussion, decreased breath sounds B/L HEART: regular rate & rhythm, no murmurs, no gallops, S1 normal and S2 normal ABDOMEN:abdomen soft, non-tender, obese and normal bowel sounds BACK: Back symmetric, no curvature., No CVA tenderness EXTREMITIES:less then 2 second capillary refill, no joint deformities, effusion, or inflammation, no edema, no skin discoloration, no clubbing, no cyanosis, flattening of the fingernails  NEURO: alert & oriented x 3 with fluent speech, no focal motor/sensory deficits, gait normal    LABORATORY DATA: CBC    Component Value Date/Time   WBC 3.7* 07/04/2011 1231   RBC 4.55 07/04/2011 1231   HGB 13.6 07/04/2011 1231   HCT 42.7 07/04/2011 1231   PLT 67* 07/04/2011 1231   MCV 93.8 07/04/2011 1231   MCH 29.9 07/04/2011 1231   MCHC 31.9 07/04/2011 1231   RDW 16.4* 07/04/2011 1231   LYMPHSABS 2.0 12/18/2010 1235   MONOABS 0.7 12/18/2010 1235   EOSABS 0.1 12/18/2010 1235   BASOSABS 0.0 12/18/2010 1235    Lab Results  Component Value Date   FERRITIN 38 07/04/2011      ASSESSMENT:  1. Multifactorial anemia 2. Iron deficiency anemia  3. Liver disease with fatty infiltration 4. Obesity 5. Probably lung disease secondary to longstanding smoking history.   PLAN:  1. Feraheme 1020 mg  this week or next week 2. Monthly lab work: CBC, Ferritin 3. CMET in 3 months 4. Patient education given regarding diet and weight loss. I have asked the patient to try to improve diet and make it more healthy.  Difficult to do on his financial budget but he will try. 5. Patient education given to the patient for his Iron deficiency secondary to liver disease. 6. Patient education given to the patient regarding fatigue which is secondary to anemia, liver disease, obesity, and lung disease.   All questions were answered. The patient knows to call the clinic with any problems, questions or concerns. We can certainly see the patient much sooner if necessary.  The patient and plan discussed with Arlan Organ, MD and he is in agreement with the aforementioned.  I spent 25 minutes counseling the patient face to face. The total time spent in the appointment was 40 minutes.  KEFALAS,THOMAS

## 2011-07-07 NOTE — Patient Instructions (Signed)
[facility]  Discharge Instructions  RECOMMENDATIONS MADE BY THE CONSULTANT AND ANY TEST RESULTS WILL BE SENT TO YOUR REFERRING DOCTOR.   EXAM FINDINGS BY MD TODAY AND SIGNS AND SYMPTOMS TO REPORT TO CLINIC OR PRIMARY MD:  Exam per Jenita Seashore  MEDICATIONS PRESCRIBED:  Need Feraheme in next few days: Friday at 09:15   INSTRUCTIONS GIVEN AND DISCUSSED: Cbc and ferritin monthly SPECIAL INSTRUCTIONS/FOLLOW-UP: See Elijah Birk in 3 months   I acknowledge that I have been informed and understand all the instructions given to me and received a copy. I do not have any more questions at this time, but understand that I may call the Specialty Clinic at [facility] at [phone] during business hours should I have any further questions or need assistance in obtaining follow-up care.    __________________________________________  _____________  __________ Signature of Patient or Authorized Representative            Date                   Time    __________________________________________ Nurse's Signature

## 2011-07-11 ENCOUNTER — Encounter (HOSPITAL_BASED_OUTPATIENT_CLINIC_OR_DEPARTMENT_OTHER): Payer: Medicaid Other

## 2011-07-11 VITALS — BP 126/83 | HR 69 | Temp 97.5°F

## 2011-07-11 DIAGNOSIS — D509 Iron deficiency anemia, unspecified: Secondary | ICD-10-CM

## 2011-07-11 MED ORDER — SODIUM CHLORIDE 0.9 % IJ SOLN
INTRAMUSCULAR | Status: AC
  Administered 2011-07-11: 10 mL via INTRAVENOUS
  Filled 2011-07-11: qty 10

## 2011-07-11 MED ORDER — SODIUM CHLORIDE 0.9 % IJ SOLN
12.0000 mL | Freq: Once | INTRAMUSCULAR | Status: AC
  Administered 2011-07-11: 10 mL via INTRAVENOUS

## 2011-07-11 MED ORDER — SODIUM CHLORIDE 0.9 % IV SOLN
1020.0000 mg | Freq: Once | INTRAVENOUS | Status: AC
  Administered 2011-07-11: 1020 mg via INTRAVENOUS
  Filled 2011-07-11: qty 34

## 2011-07-16 ENCOUNTER — Ambulatory Visit: Payer: Medicaid Other | Admitting: Gastroenterology

## 2011-07-17 ENCOUNTER — Encounter (HOSPITAL_COMMUNITY): Payer: Self-pay | Admitting: Emergency Medicine

## 2011-07-17 ENCOUNTER — Emergency Department (HOSPITAL_COMMUNITY): Payer: Medicaid Other

## 2011-07-17 ENCOUNTER — Emergency Department (HOSPITAL_COMMUNITY)
Admission: EM | Admit: 2011-07-17 | Discharge: 2011-07-17 | Disposition: A | Discharge status: Home / Self Care | Payer: Medicaid Other | Attending: Emergency Medicine | Admitting: Emergency Medicine

## 2011-07-17 DIAGNOSIS — F329 Major depressive disorder, single episode, unspecified: Secondary | ICD-10-CM | POA: Insufficient documentation

## 2011-07-17 DIAGNOSIS — F3289 Other specified depressive episodes: Secondary | ICD-10-CM | POA: Insufficient documentation

## 2011-07-17 DIAGNOSIS — K7460 Unspecified cirrhosis of liver: Secondary | ICD-10-CM | POA: Insufficient documentation

## 2011-07-17 DIAGNOSIS — K5289 Other specified noninfective gastroenteritis and colitis: Secondary | ICD-10-CM

## 2011-07-17 DIAGNOSIS — Z85528 Personal history of other malignant neoplasm of kidney: Secondary | ICD-10-CM | POA: Insufficient documentation

## 2011-07-17 DIAGNOSIS — K219 Gastro-esophageal reflux disease without esophagitis: Secondary | ICD-10-CM | POA: Insufficient documentation

## 2011-07-17 DIAGNOSIS — Z794 Long term (current) use of insulin: Secondary | ICD-10-CM | POA: Insufficient documentation

## 2011-07-17 DIAGNOSIS — Z79899 Other long term (current) drug therapy: Secondary | ICD-10-CM | POA: Insufficient documentation

## 2011-07-17 DIAGNOSIS — Z7982 Long term (current) use of aspirin: Secondary | ICD-10-CM | POA: Insufficient documentation

## 2011-07-17 DIAGNOSIS — F17200 Nicotine dependence, unspecified, uncomplicated: Secondary | ICD-10-CM | POA: Insufficient documentation

## 2011-07-17 DIAGNOSIS — E119 Type 2 diabetes mellitus without complications: Secondary | ICD-10-CM | POA: Insufficient documentation

## 2011-07-17 DIAGNOSIS — I10 Essential (primary) hypertension: Secondary | ICD-10-CM | POA: Insufficient documentation

## 2011-07-17 DIAGNOSIS — E785 Hyperlipidemia, unspecified: Secondary | ICD-10-CM | POA: Insufficient documentation

## 2011-07-17 DIAGNOSIS — Z85038 Personal history of other malignant neoplasm of large intestine: Secondary | ICD-10-CM | POA: Insufficient documentation

## 2011-07-17 LAB — DIFFERENTIAL
Basophils Absolute: 0 10*3/uL (ref 0.0–0.1)
Eosinophils Relative: 0 % (ref 0–5)
Lymphocytes Relative: 16 % (ref 12–46)
Neutro Abs: 4.5 10*3/uL (ref 1.7–7.7)
Neutrophils Relative %: 71 % (ref 43–77)

## 2011-07-17 LAB — COMPREHENSIVE METABOLIC PANEL
ALT: 45 U/L (ref 0–53)
AST: 50 U/L — ABNORMAL HIGH (ref 0–37)
Alkaline Phosphatase: 59 U/L (ref 39–117)
CO2: 25 mEq/L (ref 19–32)
Calcium: 9.5 mg/dL (ref 8.4–10.5)
GFR calc non Af Amer: >60 mL/min (ref >60)
Potassium: 4.2 mEq/L (ref 3.5–5.1)
Sodium: 131 mEq/L — ABNORMAL LOW (ref 135–145)

## 2011-07-17 LAB — GLUCOSE, CAPILLARY: Glucose-Capillary: 214 mg/dL — ABNORMAL HIGH (ref 70–99)

## 2011-07-17 LAB — CBC
MCV: 92.5 fL (ref 78.0–100.0)
Platelets: 69 10*3/uL — ABNORMAL LOW (ref 150–400)
RBC: 4.8 MIL/uL (ref 4.22–5.81)
RDW: 16.3 % — ABNORMAL HIGH (ref 11.5–15.5)
WBC: 6.4 10*3/uL (ref 4.0–10.5)

## 2011-07-17 LAB — URINALYSIS, ROUTINE W REFLEX MICROSCOPIC
Specific Gravity, Urine: >1.03 — ABNORMAL HIGH (ref 1.005–1.030)
pH: 5.5 (ref 5.0–8.0)

## 2011-07-17 LAB — URINE MICROSCOPIC-ADD ON

## 2011-07-17 MED ORDER — IOHEXOL 300 MG/ML  SOLN
100.0000 mL | Freq: Once | INTRAMUSCULAR | Status: AC | PRN
  Administered 2011-07-17: 100 mL via INTRAVENOUS

## 2011-07-17 MED ORDER — ONDANSETRON HCL 4 MG/2ML IJ SOLN
4.0000 mg | Freq: Once | INTRAMUSCULAR | Status: AC
  Administered 2011-07-17: 4 mg via INTRAVENOUS

## 2011-07-17 MED ORDER — HYOSCYAMINE SULFATE 0.125 MG PO TABS
ORAL_TABLET | ORAL | Status: AC
  Filled 2011-07-17: qty 2

## 2011-07-17 MED ORDER — ONDANSETRON HCL 4 MG/2ML IJ SOLN
INTRAMUSCULAR | Status: AC
  Filled 2011-07-17: qty 2

## 2011-07-17 MED ORDER — MORPHINE SULFATE 4 MG/ML IJ SOLN
4.0000 mg | Freq: Once | INTRAMUSCULAR | Status: AC
  Administered 2011-07-17: 4 mg via INTRAVENOUS
  Filled 2011-07-17: qty 1

## 2011-07-17 MED ORDER — MORPHINE SULFATE 4 MG/ML IJ SOLN
INTRAMUSCULAR | Status: AC
  Filled 2011-07-17: qty 1

## 2011-07-17 MED ORDER — CIPROFLOXACIN HCL 500 MG PO TABS: 500.0000 mg | ORAL_TABLET | Freq: Two times a day (BID) | ORAL | Status: AC

## 2011-07-17 MED ORDER — HYOSCYAMINE SULFATE 0.125 MG SL SUBL: 0.2500 mg | SUBLINGUAL_TABLET | SUBLINGUAL | Status: DC | PRN

## 2011-07-17 MED ORDER — MORPHINE SULFATE 4 MG/ML IJ SOLN
4.0000 mg | Freq: Once | INTRAMUSCULAR | Status: AC
  Administered 2011-07-17: 4 mg via INTRAVENOUS

## 2011-07-17 MED ORDER — HYOSCYAMINE SULFATE 0.125 MG SL SUBL
0.2500 mg | SUBLINGUAL_TABLET | Freq: Once | SUBLINGUAL | Status: AC
  Administered 2011-07-17: 0.25 mg via SUBLINGUAL
  Filled 2011-07-17: qty 2

## 2011-07-17 MED ORDER — SODIUM CHLORIDE 0.9 % IV SOLN
999.0000 mL | Freq: Once | INTRAVENOUS | Status: AC
  Administered 2011-07-17: 1000 mL via INTRAVENOUS

## 2011-07-17 NOTE — ED Notes (Signed)
Blood glucose 214.

## 2011-07-17 NOTE — ED Notes (Signed)
Pt c/o lower abd pain x 4 days with n/v/d.  

## 2011-07-17 NOTE — ED Provider Notes (Signed)
History     CSN: [id number] Arrival date & time: 07/17/2011 10:01 AM  Chief Complaint  Patient presents with  . Abdominal Pain   HPI  Past Medical History  Diagnosis Date  . DM (diabetes mellitus)   . Cirrhosis     bx proven steatohepatitis with cirrhosis (2010)  . HTN (hypertension)   . RLS (restless legs syndrome)   . Sleep apnea   . Hyperlipidemia   . IDA (iron deficiency anemia)   . GERD (gastroesophageal reflux disease)   . Depression   . Peripheral neuropathy   . Urothelial cancer     2010, paillary low-grade, h/o recurrence 2011  . B12 deficiency   . Psoriasis   . 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 Anemia due to multiple mechanisms 05/13/2011  . History of alcohol abuse 05/13/2011  . ANEMIA-IRON DEFICIENCY 03/09/2009    Past Surgical History  Procedure Date  . Carpel tunnel   . Shoulder surgery   . Adrenal mass surgery 05/2009    benign, left  . Bladder surgery 01/2009 and 06/2010    cancer 2010, small recurrence in 06/2010  . Colonoscopy 04/2009    moderate int hemorrhoids, rare sigmoid diverticula, one mm sessile hyperplastic rectal polyp  . Esophagogastroduodenoscopy 03/2009    small hh  . Small bowel capsule endoscopy 03/2009    couple of small benign appearing erosions, nonbleeding  . Egd/tcs 08/2007    small hiatal hernia, pancolonic diverticula, friable anal canal, 3cm salmon colored epithelium in distal esophagus, bx negative for Barrett's    Family History  Problem Relation Age of Onset  . Cirrhosis Father     etoh  . Colon cancer Neg Hx   . Kidney cancer Mother   . Cancer Mother   . HIV Brother   . Cirrhosis Brother     nash    History  Substance Use Topics  . Smoking status: Current Everyday Smoker -- 1.0 packs/day    Types: Cigarettes  . Smokeless tobacco: Not on file  . Alcohol Use: No     drank heavily for few years in 20s      Review of Systems  Physical Exam  BP 132/79  Pulse 73  Temp 97.6 F (36.4 C)  Resp  21  Ht 5\' 7"  (1.702 m)  Wt 248 lb (112.492 kg)  BMI 38.84 kg/m2  SpO2 93%  Physical Exam  ED Course  Procedures  MDM   3:19PM- Benny Lennert, MD to bedside to exam patient. Patient reports severe abdominal pain described as cramping onset several days ago and persistent since with associated diarrhea. Patient reports he initially was constipated and took an Exlax which brought on persistent diarrhea he has been experiencing. Patient also notes increased swelling of abdomen. Upon exam, patient's abdomen moderately distended with diffuse tenderness to palpation.   Medical screening examination/treatment/procedure(s) were conducted as a shared visit with non-physician practitioner(s) and myself.  I personally evaluated the patient during the encounter Benny Lennert, MD   This pt was seen by julie idol pa  Benny Lennert, MD 07/21/11 1027

## 2011-07-17 NOTE — ED Notes (Signed)
Patient dry heaving. Patient lying in bed. Wife remains at bedside. Patient provided with emesis bag. Carmina Miller, PA aware. Awaiting orders.

## 2011-07-17 NOTE — ED Notes (Signed)
Patient with no complaints at this time. Respirations even and unlabored. Skin warm/dry. Discharge instructions reviewed with patient at this time. Patient given opportunity to voice concerns/ask questions. IV removed per policy and band-aid applied to site. Patient discharged at this time and left Emergency Department with steady gait.  

## 2011-07-21 ENCOUNTER — Encounter: Payer: Self-pay | Admitting: Gastroenterology

## 2011-07-21 ENCOUNTER — Ambulatory Visit (INDEPENDENT_AMBULATORY_CARE_PROVIDER_SITE_OTHER): Payer: Medicaid Other | Admitting: Gastroenterology

## 2011-07-21 VITALS — BP 116/75 | HR 66 | Temp 97.9°F | Ht 67.0 in | Wt 242.2 lb

## 2011-07-21 DIAGNOSIS — R195 Other fecal abnormalities: Secondary | ICD-10-CM

## 2011-07-21 DIAGNOSIS — K7460 Unspecified cirrhosis of liver: Secondary | ICD-10-CM

## 2011-07-21 DIAGNOSIS — D509 Iron deficiency anemia, unspecified: Secondary | ICD-10-CM

## 2011-07-21 NOTE — Progress Notes (Signed)
Was supposed to be here last Wednesday, had severe abdominal pain, went to ED. Got IVFs. Lower abdominal pain. ?colitis.  Started last Sun, Mon, Tues. Thought he was constipated, ex lax didn't work. Loose stools. Yesterday 6. Pain is now just cramping. At hospital was stabbing. +N/V, now resolved. No blood in stools. Dark brown now.  Given abx in hospital. None prior. No sick contacts. No new meds. Relieved after loose stools.   Prior to this BM twice per day.

## 2011-07-21 NOTE — Patient Instructions (Signed)
Please collect stool studies and return.  In the meantime, follow a low residue diet.   Add a probiotic. You can get this over-the-counter in the form of Digestive Advantage, Align, Restora. We have given you samples.   We will see you back in 4 weeks. We will obtain a stool sample for blood at that time.

## 2011-07-21 NOTE — Progress Notes (Signed)
Referring Provider: Isac Sarna, MD Primary Care Physician:  Kyle Sarna, MD Primary Gastroenterologist: Dr. Jena Gauss   Chief Complaint  Patient presents with  . Abdominal Pain    when to the ER thursday     HPI:   Kyle [name] is a pleasant 55 year old male well known to our practice. [name] cirrhosis. Hx IDA, with thorough work-up in 2010. Drop in ferritin a few months ago, and we proceeded with EGD/colonoscopy. Had surveillance EGD May 2012. Due for repeat in 18 mos. Also had colonoscopy at that time. Findings outlined below. He has not been switched from atenolol to non-selective beta-blocker per rec's due to between PCPs currently. Will be with [facility] in [place] in the next month or so.  Reports severe lower abdominal pain starting last Sunday. Stabbing at onset, +N/V, loose stools. Presented to ED. CT performed and outlined below. Given abx at hospital. No sick contacts, no new meds.  Now denies severe pain. Feels it is getting better. Just cramping intermittently, relieved with loose stools. Upwards of 6 per day still. No melena or brbpr. Dark brown. Baseline status is 2 BMs per day.    8/23 abd xray:  IMPRESSION:  1. Chronic lung changes and low lung volumes.  2. Possible colonic wall thickening may suggest colitis. No  findings for small bowel obstruction or free air.  8/23 CT:  IMPRESSION:  Cirrhotic appearing liver with significant splenomegaly increased  since previous exam and tiny amount of new ascites.  Colon is underdistended at multiple sites, with areas of  questionable wall thickening which are potentially artifactual in  origin.  Scattered areas of intra abdominal edema and infiltration, question  related to ascites, without definite focal inflammatory process  identified as discussed above.  Tiny ventral hernia containing ascites.  Stable nonspecific prominent periportal lymph nodes    Past Medical History  Diagnosis Date  . DM  (diabetes mellitus)   . Cirrhosis     bx proven steatohepatitis with cirrhosis (2010)  . HTN (hypertension)   . RLS (restless legs syndrome)   . Sleep apnea   . Hyperlipidemia   . IDA (iron deficiency anemia)   . GERD (gastroesophageal reflux disease)   . Depression   . Peripheral neuropathy   . Urothelial cancer     20 10, paillary low-grade, h/o recurrence 2011  . B12 deficiency   . Psoriasis   . 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 Anemia due to multiple mechanisms 05/13/2011  . History of alcohol abuse 05/13/2011  . ANEMIA-IRON DEFICIENCY 03/09/2009  . S/P endoscopy May 2012    4 columns grade II esophageal varices; due for repeat in Nov 2013   . S/P colonoscopy May 2012    Tubular adenoma    Past Surgical History  Procedure Date  . Carpel tunnel   . Shoulder surgery   . Adrenal mass surgery 05/2009    benign, left  . Bladder surgery 01/2009 and 06/2010    cancer 2010, small recurrence in 06/2010  . Colonoscopy 04/2009    moderate int hemorrhoids, rare sigmoid diverticula, one mm sessile hyperplastic rectal polyp  . Esophagogastroduodenoscopy 03/2009    small hh  . Small bowel capsule endoscopy 03/2009    couple of small benign appearing erosions, nonbleeding  . Egd/tcs 08/2007    small hiatal hernia, pancolonic diverticula, friable anal canal, 3cm salmon colored epithelium in distal esophagus, bx negative for Barrett's    Current Outpatient Prescriptions  Medication Sig Dispense Refill  . amoxicillin-clavulanate (AUGMENTIN)  875-125 MG per tablet Take 1 tablet by mouth 2 (two) times daily.        [name] aspirin 81 MG tablet Take 81 mg by mouth daily.        [name] atenolol (TENORMIN) 100 MG tablet Take 100 mg by mouth daily.        . ciprofloxacin (CIPRO) 500 MG tablet Take 1 tablet (500 mg total) by mouth every 12 (twelve) hours.  14 tablet  0  . clotrimazole (LOTRIMIN) 1 % cream Apply 1 application topically 2 (two) times daily as needed. psoriasis      . fish  oil-omega-3 fatty acids 1000 MG capsule Take 2 g by mouth daily.        . Fluticasone-Salmeterol (ADVAIR) 250-50 MCG/DOSE AEPB Inhale 1 puff into the lungs every 12 (twelve) hours as needed. For shortness of breath       . furosemide (LASIX) 20 MG tablet Take 40 mg by mouth daily.        [name] gabapentin (NEURONTIN) 300 MG capsule Take 900 mg by mouth 2 (two) times daily.       . hydrochlorothiazide (,MICROZIDE/HYDRODIURIL,) 12.5 MG capsule Take 12.5 mg by mouth daily.        . hyoscyamine (LEVSIN SL) 0.125 MG SL tablet Place 2 tablets (0.25 mg total) under the tongue every 4 (four) hours as needed for cramping.  30 tablet  0  . insulin aspart (NOVOLOG) 100 UNIT/ML injection Inject into the skin 2 (two) times daily. Sliding Scale      . insulin detemir (LEVEMIR) 100 UNIT/ML injection Inject 120 Units into the skin daily.        [name] lisinopril (PRINIVIL,ZESTRIL) 20 MG tablet Take 10 mg by mouth daily.       . magnesium oxide (MAG-OX) 400 MG tablet Take 400 mg by mouth daily.        . metFORMIN (GLUCOPHAGE) 1000 MG tablet Take 1,000 mg by mouth 2 (two) times daily with a meal.        . metFORMIN (GLUMETZA) 1000 MG (MOD) 24 hr tablet Take 1,000 mg by mouth daily with breakfast.        . Multiple Vitamin (MULTIVITAMIN) capsule Take 1 capsule by mouth daily.        [name] omeprazole (PRILOSEC) 40 MG capsule Take 40 mg by mouth daily.        . pramipexole (MIRAPEX) 0.5 MG tablet Take 0.5 mg by mouth at bedtime.       . simvastatin (ZOCOR) 20 MG tablet Take 20 mg by mouth at bedtime.        . traMADol (ULTRAM) 50 MG tablet Take 50 mg by mouth 2 (two) times daily as needed. For pain      . traZODone (DESYREL) 50 MG tablet Take 100 mg by mouth at bedtime.       . Zinc 50 MG CAPS Take 1 capsule by mouth daily.          Allergies as of 07/21/2011 - Review Complete 07/21/2011  Allergen Reaction Noted  . Tylenol (acetaminophen) Other (See Comments) 07/17/2011    Family History  Problem Relation Age of Onset  .  Cirrhosis Father     etoh  . Colon cancer Neg Hx   . Kidney cancer Mother   . Cancer Mother   . HIV Brother   . Cirrhosis Brother     nash    History   Social History  . Marital Status: Married  Spouse Name: N/A    Number of Children: 3  . Years of Education: N/A   Occupational History  . disabled    Social History Main Topics  . Smoking status: Current Everyday Smoker -- 1.0 packs/day    Types: Cigarettes  . Smokeless tobacco: None  . Alcohol Use: No     drank heavily for few years in 20s  . Drug Use: No  . Sexually Active: None   Other Topics Concern  . None   Social History Narrative  . None    Review of Systems: Gen: Denies fever, chills, anorexia. Denies fatigue, weakness, weight loss.  CV: Denies chest pain, palpitations, syncope, peripheral edema, and claudication. Resp: Denies dyspnea at rest, cough, wheezing, coughing up blood, and pleurisy. GI: Denies vomiting blood, jaundice, and fecal incontinence.   Denies dysphagia or odynophagia. Derm: Denies rash, itching, dry skin Psych: Denies depression, anxiety, memory loss, confusion. No homicidal or suicidal ideation.  Heme: Denies bruising, bleeding, and enlarged lymph nodes.  Physical Exam: BP 116/75  Pulse 66  Temp(Src) 97.9 F (36.6 C) (Temporal)  Ht 5\' 7"  (1.702 m)  Wt 242 lb 3.2 oz (109.861 kg)  BMI 37.93 kg/m2 General:   Alert and oriented. No distress noted. Pleasant and cooperative.  Head:  Normocephalic and atraumatic. Eyes:  Conjuctiva clear without scleral icterus. Mouth:  Oral mucosa pink and moist. Good dentition. No lesions. Neck:  Supple, without mass or thyromegaly. Heart:  S1, S2 present without murmurs, rubs, or gallops. Regular rate and rhythm. Abdomen:  +BS, soft, very mild tenderness in lower abdomen and non-distended. No rebound or guarding. No HSM or masses noted. Msk:  Symmetrical without gross deformities. Normal posture. Extremities:  Trace edema Neurologic:  Alert and   oriented x4;  grossly normal neurologically. Skin:  Intact without significant lesions or rashes. Cervical Nodes:  No significant cervical adenopathy. Psych:  Alert and cooperative. Normal mood and affect.

## 2011-07-22 ENCOUNTER — Other Ambulatory Visit: Payer: Self-pay | Admitting: Gastroenterology

## 2011-07-22 ENCOUNTER — Encounter: Payer: Self-pay | Admitting: Gastroenterology

## 2011-07-22 DIAGNOSIS — R195 Other fecal abnormalities: Secondary | ICD-10-CM | POA: Insufficient documentation

## 2011-07-22 NOTE — ED Provider Notes (Signed)
History     CSN: [id number] Arrival date & time: 07/17/2011 10:01 AM  Chief Complaint  Patient presents with  . Abdominal Pain   Patient is a 55 y.o. male presenting with abdominal pain. The history is provided by the patient.  Abdominal Pain The primary symptoms of the illness include abdominal pain, nausea, vomiting and diarrhea. The primary symptoms of the illness do not include fever or shortness of breath. The current episode started more than 2 days ago. The onset of the illness was gradual. The problem has not changed since onset. Onset: 4 days ago. The pain came on gradually. The abdominal pain has been unchanged since its onset. The abdominal pain is generalized. The abdominal pain does not radiate. The abdominal pain is relieved by nothing. Exacerbated by: nothing.  Symptoms associated with the illness do not include chills, diaphoresis or back pain. Associated symptoms comments: He has diarrhea and nausea with occasional emesis.  He started out with constipation,  Took several doses of exlax,  And is now experiencing diarrhea..    Past Medical History  Diagnosis Date  . DM (diabetes mellitus)   . Cirrhosis     bx proven steatohepatitis with cirrhosis (2010)  . HTN (hypertension)   . RLS (restless legs syndrome)   . Sleep apnea   . Hyperlipidemia   . IDA (iron deficiency anemia)   . GERD (gastroesophageal reflux disease)   . Depression   . Peripheral neuropathy   . Urothelial cancer     2010, paillary low-grade, h/o recurrence 2011  . B12 deficiency   . Psoriasis   . 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 Anemia due to multiple mechanisms 05/13/2011  . History of alcohol abuse 05/13/2011  . ANEMIA-IRON DEFICIENCY 03/09/2009    Past Surgical History  Procedure Date  . Carpel tunnel   . Shoulder surgery   . Adrenal mass surgery 05/2009    benign, left  . Bladder surgery 01/2009 and 06/2010    cancer 2010, small recurrence in 06/2010  . Colonoscopy 04/2009   moderate int hemorrhoids, rare sigmoid diverticula, one mm sessile hyperplastic rectal polyp  . Esophagogastroduodenoscopy 03/2009    small hh  . Small bowel capsule endoscopy 03/2009    couple of small benign appearing erosions, nonbleeding  . Egd/tcs 08/2007    small hiatal hernia, pancolonic diverticula, friable anal canal, 3cm salmon colored epithelium in distal esophagus, bx negative for Barrett's    Family History  Problem Relation Age of Onset  . Cirrhosis Father     etoh  . Colon cancer Neg Hx   . Kidney cancer Mother   . Cancer Mother   . HIV Brother   . Cirrhosis Brother     nash    History  Substance Use Topics  . Smoking status: Current Everyday Smoker -- 1.0 packs/day    Types: Cigarettes  . Smokeless tobacco: Not on file  . Alcohol Use: No     drank heavily for few years in 20s      Review of Systems  Constitutional: Negative for fever, chills and diaphoresis.  HENT: Negative for congestion, sore throat and neck pain.   Eyes: Negative.   Respiratory: Negative for chest tightness and shortness of breath.   Cardiovascular: Negative for chest pain.  Gastrointestinal: Positive for nausea, vomiting, abdominal pain and diarrhea.  Genitourinary: Negative.   Musculoskeletal: Negative for back pain, joint swelling and arthralgias.  Skin: Negative.  Negative for rash and wound.  Neurological: Negative for  dizziness, weakness, light-headedness, numbness and headaches.  Hematological: Negative.   Psychiatric/Behavioral: Negative.     Physical Exam  BP 142/86  Pulse 77  Temp(Src) 97.9 F (36.6 C) (Oral)  Resp 16  Ht 5\' 7"  (1.702 m)  Wt 248 lb (112.492 kg)  BMI 38.84 kg/m2  SpO2 96%  Physical Exam  Nursing note and vitals reviewed. Constitutional: He is oriented to person, place, and time. He appears well-developed and well-nourished.  HENT:  Head: Normocephalic and atraumatic.  Eyes: Conjunctivae are normal.  Neck: Normal range of motion.    Cardiovascular: Normal rate, regular rhythm, normal heart sounds and intact distal pulses.   Pulmonary/Chest: Effort normal and breath sounds normal. He has no wheezes.  Abdominal: Soft. Bowel sounds are normal. He exhibits distension. He exhibits no fluid wave and no mass. There is hepatosplenomegaly. There is generalized tenderness. There is no rebound, no guarding and no CVA tenderness.  Musculoskeletal: Normal range of motion.  Neurological: He is alert and oriented to person, place, and time.  Skin: Skin is warm and dry.  Psychiatric: He has a normal mood and affect.    ED Course  Procedures  MDM  Labs, Ct stable.  Patients symptoms improved at time of dc.  Findings most consistent with colitis.     Results for orders placed during the hospital encounter of 07/17/11  GLUCOSE, CAPILLARY      Component Value Range   Glucose-Capillary 214 (*) 70 - 99 (mg/dL)   Comment 1 Notify RN     Comment 2 Documented in Chart    CBC      Component Value Range   WBC 6.4  4.0 - 10.5 (K/uL)   RBC 4.80  4.22 - 5.81 (MIL/uL)   Hemoglobin 14.8  13.0 - 17.0 (g/dL)   HCT 16.1  09.6 - 04.5 (%)   MCV 92.5  78.0 - 100.0 (fL)   MCH 30.8  26.0 - 34.0 (pg)   MCHC 33.3  30.0 - 36.0 (g/dL)   RDW 40.9 (*) 81.1 - 15.5 (%)   Platelets 69 (*) 150 - 400 (K/uL)  DIFFERENTIAL      Component Value Range   Neutrophils Relative 71  43 - 77 (%)   Neutro Abs 4.5  1.7 - 7.7 (K/uL)   Lymphocytes Relative 16  12 - 46 (%)   Lymphs Abs 1.0  0.7 - 4.0 (K/uL)   Monocytes Relative 13 (*) 3 - 12 (%)   Monocytes Absolute 0.8  0.1 - 1.0 (K/uL)   Eosinophils Relative 0  0 - 5 (%)   Eosinophils Absolute 0.0  0.0 - 0.7 (K/uL)   Basophils Relative 0  0 - 1 (%)   Basophils Absolute 0.0  0.0 - 0.1 (K/uL)  COMPREHENSIVE METABOLIC PANEL      Component Value Range   Sodium 131 (*) 135 - 145 (mEq/L)   Potassium 4.2  3.5 - 5.1 (mEq/L)   Chloride 95 (*) 96 - 112 (mEq/L)   CO2 25  19 - 32 (mEq/L)   Glucose, Bld 210 (*) 70 -  99 (mg/dL)   BUN 19  6 - 23 (mg/dL)   Creatinine, Ser 9.14  0.50 - 1.35 (mg/dL)   Calcium 9.5  8.4 - 78.2 (mg/dL)   Total Protein 8.4 (*) 6.0 - 8.3 (g/dL)   Albumin 3.6  3.5 - 5.2 (g/dL)   AST 50 (*) 0 - 37 (U/L)   ALT 45  0 - 53 (U/L)   Alkaline Phosphatase 59  39 - 117 (U/L)   Total Bilirubin 0.9  0.3 - 1.2 (mg/dL)   GFR calc non Af Amer >60  >60 (mL/min)   GFR calc Af Amer >60  >60 (mL/min)  URINALYSIS, ROUTINE W REFLEX MICROSCOPIC      Component Value Range   Color, Urine YELLOW  YELLOW    Appearance CLEAR  CLEAR    Specific Gravity, Urine >1.030 (*) 1.005 - 1.030    pH 5.5  5.0 - 8.0    Glucose, UA NEGATIVE  NEGATIVE (mg/dL)   Hgb urine dipstick MODERATE (*) NEGATIVE    Bilirubin Urine SMALL (*) NEGATIVE    Ketones, ur NEGATIVE  NEGATIVE (mg/dL)   Protein, ur 161 (*) NEGATIVE (mg/dL)   Urobilinogen, UA 1.0  0.0 - 1.0 (mg/dL)   Nitrite NEGATIVE  NEGATIVE    Leukocytes, UA NEGATIVE  NEGATIVE   URINE MICROSCOPIC-ADD ON      Component Value Range   Squamous Epithelial / LPF FEW (*) RARE    WBC, UA 0-2  <3 (WBC/hpf)   RBC / HPF 3-6  <3 (RBC/hpf)   Bacteria, UA FEW (*) RARE    Casts HYALINE CASTS (*) NEGATIVE    Ct Abdomen Pelvis W Contrast  07/17/2011  *RADIOLOGY REPORT*  Clinical Data:  Abdominal pain question diverticulitis; history bladder cancer, adrenal mass, smoking, hypertension, cirrhosis, GI bleed, ethanol abuse  CT ABDOMEN AND PELVIS WITH CONTRAST  Technique:  Multidetector CT imaging of the abdomen and pelvis was performed following the standard protocol during bolus administration of intravenous contrast. Sagittal and coronal MPR images reconstructed from axial data set.  Contrast: Water as oral contrast. 100 ml Omnipaque 300 IV.  Comparison: 06/17/2010  Findings: Lung bases clear. Nodular cirrhotic appearing liver without focal mass. Splenic enlargement, spleen enlarged since previous study, now measuring 15.6 x 9.4 cm image 30, 20.4 cm length. No definite focal  abnormalities of the liver, spleen, pancreas, kidneys, or adrenal glands. Periportal adenopathy is little changed, largest nodes measuring 2.4 x 1.5 cm image 34 and 3.4 x 1.8 cm image 39. Minimal perihepatic ascites. Small ventral hernia containing fluid midline supraumbilical image 58. Additional transversalis fascial defect with overlying skin dimpling left mid abdomen, question site of prior colostomy now post reversal.  Colon is underdistended throughout its length, unable to exclude wall thickening at multiple sites at the ascending colon, cecum, and mid descending colon. Diffuse infiltrative changes of intra abdominal fat planes is identified, question related to ascites, though making it difficult to exclude subtle pericolic inflammation. No definite focal area of acute diverticulitis identified though this is impossible to completely exclude at multiple sites. Scattered atherosclerotic calcifications without aneurysm. Stomach and small bowel loops grossly unremarkable. No mass, additional adenopathy, or free air. Degenerative disc disease changes L5-S1. No acute bony findings.  IMPRESSION: Cirrhotic appearing liver with significant splenomegaly increased since previous exam and tiny amount of new ascites. Colon is underdistended at multiple sites, with areas of questionable wall thickening which are potentially artifactual in origin. Scattered areas of intra abdominal edema and infiltration, question related to ascites, without definite focal inflammatory process identified as discussed above. Tiny ventral hernia containing ascites. Stable nonspecific prominent periportal lymph nodes.  Original Report Authenticated By: Lollie Marrow, M.D.   Dg Abd Acute W/chest  07/17/2011  *RADIOLOGY REPORT*  Clinical Data: Abdominal pain and distention.  ACUTE ABDOMEN SERIES (ABDOMEN 2 VIEW & CHEST 1 VIEW)  Comparison: Prior study 05/07/2009.  Findings: The upright chest x-ray demonstrates chronic bronchitic  type lung  changes and low lung volumes with vascular crowding and atelectasis.  No infiltrates or effusions.  Two views of the abdomen demonstrate possible findings for wall thickening involving the ascending and transverse colon.  Cannot exclude colitis.  No findings for small bowel obstruction or free air.  The soft tissue shadows are maintained.  The bony structures are intact.  IMPRESSION:  1.  Chronic lung changes and low lung volumes. 2.  Possible colonic wall thickening may suggest colitis.  No findings for small bowel obstruction or free air.  Original Report Authenticated By: P. Loralie Champagne, M.D.            Candis Musa, PA 07/22/11 [phone]

## 2011-07-22 NOTE — Assessment & Plan Note (Signed)
Stable. Needs US abdomen in October for [facility] surveillance. Trace lower extremity edema. No significant ascites noted on abdominal exam. Will reassess in 4 weeks. No change to current diuretics due to acute diarrheal process. Return in 4 weeks. Follow low Na diet.

## 2011-07-22 NOTE — Assessment & Plan Note (Signed)
55 year old Caucasian male with acute onset of lower abdominal pain, followed by loose stools upwards of 6 per day since last Sunday. Reported to ED with CT findings as described above. Recent colonoscopy on file, which is reassuring. No fever/chills. Abdominal pain is improving. Question of acute illness in process of resolving. Will assess with stool studies for completeness. Needs to f/u in 4 weeks.  Stool culture, Cdiff PCR, Giardia Probiotic daily Low residue diet 4 week f/u Contact our office if no improvement or worsening in next few days.

## 2011-07-22 NOTE — Assessment & Plan Note (Signed)
Thorough work-up in 2010, recent drop in ferritin a few months ago. EGD/colonoscopy on file. Will repeat another ifobt in 4-6 weeks after acute illness resolved. Continue to follow Dr. Mariel Sleet.

## 2011-07-23 NOTE — ED Provider Notes (Signed)
Medical screening examination/treatment/procedure(s) were performed by non-physician practitioner and as supervising physician I was immediately available for consultation/collaboration.   Benny Lennert, MD 07/23/11 1039

## 2011-07-31 NOTE — Progress Notes (Signed)
Quick Note:  Negative stool studies. We will be seeing patient 9/25. We will obtain ifobt at that time. ______

## 2011-08-01 ENCOUNTER — Telehealth: Payer: Self-pay

## 2011-08-01 NOTE — Progress Notes (Signed)
Quick Note:  Tried to call pt- LM with brother for him to return call ______

## 2011-08-01 NOTE — Telephone Encounter (Signed)
Make sure he is taking a probiotic. We can call in some Levsin 0.125 mg sublingual tablets- he is to take 1 sublingually 20 minutes before meals and at bedtime when necessary for abdominal cramps and diarrhea. Dispense 60 with one refill

## 2011-08-01 NOTE — Telephone Encounter (Signed)
Called pt with results from stool studies. He saw AS on 8/27 and he thought she was going to send in an rx for abd cramping. He stated nothing was called in and wants to know if we can call in something. He uses [facility]- .

## 2011-08-01 NOTE — Telephone Encounter (Signed)
rx called to [facility], pt aware

## 2011-08-07 ENCOUNTER — Ambulatory Visit (HOSPITAL_COMMUNITY): Payer: Medicaid Other

## 2011-08-07 ENCOUNTER — Encounter (HOSPITAL_COMMUNITY): Payer: Medicaid Other | Attending: Oncology

## 2011-08-07 DIAGNOSIS — K7689 Other specified diseases of liver: Secondary | ICD-10-CM

## 2011-08-07 DIAGNOSIS — D6489 Other specified anemias: Secondary | ICD-10-CM

## 2011-08-07 DIAGNOSIS — D509 Iron deficiency anemia, unspecified: Secondary | ICD-10-CM

## 2011-08-07 DIAGNOSIS — D6959 Other secondary thrombocytopenia: Secondary | ICD-10-CM

## 2011-08-07 DIAGNOSIS — K7460 Unspecified cirrhosis of liver: Secondary | ICD-10-CM

## 2011-08-07 DIAGNOSIS — D649 Anemia, unspecified: Secondary | ICD-10-CM | POA: Insufficient documentation

## 2011-08-07 LAB — CBC
HCT: 42.3 % (ref 39.0–52.0)
Hemoglobin: 14 g/dL (ref 13.0–17.0)
MCH: 31.4 pg (ref 26.0–34.0)
MCHC: 33.1 g/dL (ref 30.0–36.0)
MCV: 94.8 fL (ref 78.0–100.0)
RBC: 4.46 MIL/uL (ref 4.22–5.81)

## 2011-08-07 NOTE — Progress Notes (Signed)
Labs drawn today for cbc/ferr 

## 2011-08-08 ENCOUNTER — Other Ambulatory Visit (HOSPITAL_COMMUNITY): Payer: Self-pay | Admitting: Oncology

## 2011-08-18 ENCOUNTER — Ambulatory Visit: Payer: Medicaid Other | Admitting: Gastroenterology

## 2011-08-18 LAB — BASIC METABOLIC PANEL
BUN: 11
CO2: 26
Chloride: 106
Creatinine, Ser: 0.99
Glucose, Bld: 149 — ABNORMAL HIGH
Potassium: 4.3

## 2011-08-18 LAB — DIFFERENTIAL
Basophils Relative: 0
Eosinophils Absolute: 0.2
Eosinophils Relative: 2
Lymphs Abs: 2.5

## 2011-08-18 LAB — CBC
HCT: 38.8 — ABNORMAL LOW
MCHC: 34.5
MCV: 86.5
Platelets: 178
WBC: 7.8

## 2011-08-18 LAB — B-NATRIURETIC PEPTIDE (CONVERTED LAB): Pro B Natriuretic peptide (BNP): <30

## 2011-08-19 ENCOUNTER — Ambulatory Visit (INDEPENDENT_AMBULATORY_CARE_PROVIDER_SITE_OTHER): Payer: Medicaid Other | Admitting: Gastroenterology

## 2011-08-19 ENCOUNTER — Encounter: Payer: Self-pay | Admitting: Gastroenterology

## 2011-08-19 VITALS — BP 131/80 | HR 67 | Temp 97.2°F | Ht 67.0 in | Wt 248.0 lb

## 2011-08-19 DIAGNOSIS — D649 Anemia, unspecified: Secondary | ICD-10-CM

## 2011-08-19 DIAGNOSIS — K7460 Unspecified cirrhosis of liver: Secondary | ICD-10-CM

## 2011-08-19 NOTE — Progress Notes (Signed)
Referring Provider: Isac Sarna, MD Primary Care Physician:  Isac Sarna, MD Primary Gastroenterologist: Dr. Jena Gauss   Chief Complaint  Patient presents with  . Follow-up    HPI:  Here for follow-up, well-known to [place]. Hx of [name] cirrhosis, IDA, thorough work-up in 2010. Dropped ferritin early Jan this year, EGD/colonoscopy performed. Due for surveillance EGD in Nov 2013. Not been switched from atenolol to non-selective beta-blocker yet, despite establishing care at different PCP. Alvan Dame, an NP at [facility] on [facility]. Goes back again in 30 days. Says full physical to be done at that time. Since last visit, his lower abdominal pain and diarrhea have resolved. Likely acute illness. Stool studies negative.  Denies N/V. Up about 6 lbs since last visit Aug 2012. Says he has been pigging out.  Wt ranges between 245-250.    Ferritin levels:  Sept: 208   Aug: 38   July: 206  June: 23  May: 237  Jan: 10 (L)  Past Medical History  Diagnosis Date  . DM (diabetes mellitus)   . Cirrhosis     bx proven steatohepatitis with cirrhosis (2010)  . HTN (hypertension)   . RLS (restless legs syndrome)   . Sleep apnea   . Hyperlipidemia   . IDA (iron deficiency anemia)   . GERD (gastroesophageal reflux disease)   . Depression   . Peripheral neuropathy   . Urothelial cancer     2010, paillary low-grade, h/o recurrence 2011  . B12 deficiency   . Psoriasis   . 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 Anemia due to multiple mechanisms 05/13/2011  . History of alcohol abuse 05/13/2011  . ANEMIA-IRON DEFICIENCY 03/09/2009  . S/P endoscopy May 2012    4 columns grade II esophageal varices; due for repeat in Nov 2013   . S/P colonoscopy May 2012    Tubular adenoma    Past Surgical History  Procedure Date  . Carpel tunnel   . Shoulder surgery   . Adrenal mass surgery 05/2009    benign, left  . Bladder surgery 01/2009 and 06/2010    cancer 2010, small  recurrence in 06/2010  . Colonoscopy 04/2009    moderate int hemorrhoids, rare sigmoid diverticula, one mm sessile hyperplastic rectal polyp  . Esophagogastroduodenoscopy 03/2009    small hh  . Small bowel capsule endoscopy 03/2009    couple of small benign appearing erosions, nonbleeding  . Egd/tcs 08/2007    small hiatal hernia, pancolonic diverticula, friable anal canal, 3cm salmon colored epithelium in distal esophagus, bx negative for Barrett's    Current Outpatient Prescriptions  Medication Sig Dispense Refill  . amoxicillin-clavulanate (AUGMENTIN) 875-125 MG per tablet Take 1 tablet by mouth 2 (two) times daily.        [name] aspirin 81 MG tablet Take 81 mg by mouth daily.        [name] atenolol (TENORMIN) 100 MG tablet Take 100 mg by mouth daily.        . clotrimazole (LOTRIMIN) 1 % cream Apply 1 application topically 2 (two) times daily as needed. psoriasis      . fish oil-omega-3 fatty acids 1000 MG capsule Take 2 g by mouth daily.        . Fluticasone-Salmeterol (ADVAIR) 250-50 MCG/DOSE AEPB Inhale 1 puff into the lungs every 12 (twelve) hours as needed. For shortness of breath       . furosemide (LASIX) 20 MG tablet Take 40 mg by mouth daily.        [name]  gabapentin (NEURONTIN) 300 MG capsule Take 900 mg by mouth 2 (two) times daily.       . hydrochlorothiazide (,MICROZIDE/HYDRODIURIL,) 12.5 MG capsule Take 12.5 mg by mouth daily.        . insulin aspart (NOVOLOG) 100 UNIT/ML injection Inject into the skin 2 (two) times daily. Sliding Scale      . insulin detemir (LEVEMIR) 100 UNIT/ML injection Inject 120 Units into the skin daily.        [name] lisinopril (PRINIVIL,ZESTRIL) 20 MG tablet Take 10 mg by mouth daily.       . magnesium oxide (MAG-OX) 400 MG tablet Take 400 mg by mouth daily.        . metFORMIN (GLUCOPHAGE) 1000 MG tablet Take 1,000 mg by mouth 2 (two) times daily with a meal.        . metFORMIN (GLUMETZA) 1000 MG (MOD) 24 hr tablet Take 1,000 mg by mouth daily with breakfast.        .  Multiple Vitamin (MULTIVITAMIN) capsule Take 1 capsule by mouth daily.        [name] omeprazole (PRILOSEC) 40 MG capsule Take 40 mg by mouth daily.        . pramipexole (MIRAPEX) 0.5 MG tablet Take 0.5 mg by mouth at bedtime.       . simvastatin (ZOCOR) 20 MG tablet Take 20 mg by mouth at bedtime.        . traMADol (ULTRAM) 50 MG tablet Take 50 mg by mouth 2 (two) times daily as needed. For pain      . traZODone (DESYREL) 50 MG tablet Take 100 mg by mouth at bedtime.       . Zinc 50 MG CAPS Take 1 capsule by mouth daily.          Allergies as of 08/19/2011 - Review Complete 08/19/2011  Allergen Reaction Noted  . Tylenol (acetaminophen) Other (See Comments) 07/17/2011    Family History  Problem Relation Age of Onset  . Cirrhosis Father     etoh  . Colon cancer Neg Hx   . Kidney cancer Mother   . Cancer Mother   . HIV Brother   . Cirrhosis Brother     nash    History   Social History  . Marital Status: Married    Spouse Name: N/A    Number of Children: 3  . Years of Education: N/A   Occupational History  . disabled    Social History Main Topics  . Smoking status: Current Everyday Smoker -- 1.0 packs/day    Types: Cigarettes  . Smokeless tobacco: None  . Alcohol Use: No     drank heavily for few years in 20s  . Drug Use: No  . Sexually Active: None   Other Topics Concern  . None   Social History Narrative  . None    Review of Systems: Gen: Denies fever, chills, anorexia. Denies fatigue, weakness, weight loss.  CV: Denies chest pain, palpitations, syncope, peripheral edema, and claudication. Resp: Denies dyspnea at rest, cough, wheezing, coughing up blood, and pleurisy. GI: Denies vomiting blood, jaundice, and fecal incontinence.   Denies dysphagia or odynophagia. Derm: Denies rash, itching, dry skin Psych: Denies depression, anxiety, memory loss, confusion. No homicidal or suicidal ideation.  Heme: Denies bruising, bleeding, and enlarged lymph nodes.  Physical  Exam: BP 131/80  Pulse 67  Temp(Src) 97.2 F (36.2 C) (Temporal)  Ht 5\' 7"  (1.702 m)  Wt 248 lb (112.492 kg)  BMI 38.84  kg/m2 General:   Alert and oriented. No distress noted. Pleasant and cooperative.  Head:  Normocephalic and atraumatic. Eyes:  Conjuctiva clear without scleral icterus. Mouth:  Oral mucosa pink and moist. Good dentition. No lesions. Neck:  Supple, without mass or thyromegaly. Heart:  S1, S2 present without murmurs, rubs, or gallops. Regular rate and rhythm. Abdomen:  +BS, soft, non-tender and non-distended. No rebound or guarding. No HSM or masses noted. Obese.  Msk:  Symmetrical without gross deformities. Normal posture. Extremities:  Without edema. Neurologic:  Alert and  oriented x4;  grossly normal neurologically. Skin:  Intact without significant lesions or rashes. Cervical Nodes:  No significant cervical adenopathy. Psych:  Alert and cooperative. Normal mood and affect.

## 2011-08-19 NOTE — Patient Instructions (Signed)
We will see you back in 6 mos.  Our office will be scheduling an appointment for a routine Ultrasound for you in approximately one month. You will not need to come in to see [place] before this is done. We will call you with the results.  Please contact our office if any problems in the meantime.

## 2011-08-20 NOTE — Assessment & Plan Note (Addendum)
[name] cirrhosis, stable. [place] for [facility] surveillance in October 2012. Follow-up in 6 mos for routine labs including AFP and [place] to be scheduled then as well. Will be calling Monica, ANP to discuss switching from Atenolol to non-selective beta-blocker due to hx of varices. Surveillance EGD Nov 2013.    Addendum Nov 27: per note in feb 2011, pt had received Hep A and B vaccines in 2010. Will need to clarify this with him.

## 2011-08-20 NOTE — Progress Notes (Signed)
Cc to PCP 

## 2011-08-20 NOTE — Assessment & Plan Note (Signed)
Recent EGD/colonoscopy on file. No anemia on CBC. Ferritin greatly improved. No signs of GI bleed. Will hold off on ifobt now. If drop in Hgb or ferritin, obtain ifobt, consider capsule. Stable for now. 6 mos f/u.

## 2011-08-25 HISTORY — PX: SKIN CANCER EXCISION: SHX779

## 2011-08-29 ENCOUNTER — Other Ambulatory Visit (HOSPITAL_COMMUNITY): Payer: Self-pay

## 2011-08-29 ENCOUNTER — Telehealth (HOSPITAL_COMMUNITY): Payer: Self-pay

## 2011-08-29 DIAGNOSIS — Z205 Contact with and (suspected) exposure to viral hepatitis: Secondary | ICD-10-CM

## 2011-08-29 NOTE — Telephone Encounter (Signed)
Patient is concerned about potential for Hepatitis C.  Son visited for approximately 1 month.  Patient was contacted yesterday and was told son was in the hospital with liver failure and had hepatitis C and has probably had it for some time. Not aware of any contact with same eating or drinking utensils, but is concerned about potential use of his insulin syringes.  Does not think they have been tampered with but can't be absolutely certain. Wants to know if needs to be tested for Hepatitis C.

## 2011-08-29 NOTE — Progress Notes (Signed)
[name], MD More Detail >>    [name], MD     Sent: Caleen Essex August 29, 2011  1:37 PM    To: Evelena Leyden, RN   Kyle [name]    MRN: [id number] DOB: April 29, 1956   Pt Home: [phone]      Message     Would just check his cmet and acute hepatitis panel in 4-6 weeks.   1800 Patient notified that we will check acute hepatitis panel when he comes for labs in November.Catalina Gravel

## 2011-09-01 ENCOUNTER — Encounter (HOSPITAL_COMMUNITY): Payer: Medicaid Other | Attending: Oncology

## 2011-09-01 DIAGNOSIS — D649 Anemia, unspecified: Secondary | ICD-10-CM | POA: Insufficient documentation

## 2011-09-01 DIAGNOSIS — K7460 Unspecified cirrhosis of liver: Secondary | ICD-10-CM | POA: Insufficient documentation

## 2011-09-01 DIAGNOSIS — D6959 Other secondary thrombocytopenia: Secondary | ICD-10-CM | POA: Insufficient documentation

## 2011-09-01 DIAGNOSIS — K7689 Other specified diseases of liver: Secondary | ICD-10-CM | POA: Insufficient documentation

## 2011-09-01 DIAGNOSIS — D509 Iron deficiency anemia, unspecified: Secondary | ICD-10-CM | POA: Insufficient documentation

## 2011-09-01 LAB — CBC
HCT: 44.2 % (ref 39.0–52.0)
MCHC: 33.7 g/dL (ref 30.0–36.0)
MCV: 94.8 fL (ref 78.0–100.0)
Platelets: 77 10*3/uL — ABNORMAL LOW (ref 150–400)
RDW: 15.4 % (ref 11.5–15.5)

## 2011-09-01 NOTE — Progress Notes (Signed)
Labs drawn today for cbc,ferr 

## 2011-09-08 LAB — URINALYSIS, ROUTINE W REFLEX MICROSCOPIC
Glucose, UA: 100 — AB
Protein, ur: >300 — AB

## 2011-10-02 ENCOUNTER — Encounter (HOSPITAL_COMMUNITY): Payer: Medicaid Other | Attending: Oncology

## 2011-10-02 DIAGNOSIS — K922 Gastrointestinal hemorrhage, unspecified: Secondary | ICD-10-CM | POA: Insufficient documentation

## 2011-10-02 DIAGNOSIS — D649 Anemia, unspecified: Secondary | ICD-10-CM | POA: Insufficient documentation

## 2011-10-02 DIAGNOSIS — G2581 Restless legs syndrome: Secondary | ICD-10-CM | POA: Insufficient documentation

## 2011-10-02 DIAGNOSIS — D6959 Other secondary thrombocytopenia: Secondary | ICD-10-CM

## 2011-10-02 DIAGNOSIS — K7460 Unspecified cirrhosis of liver: Secondary | ICD-10-CM

## 2011-10-02 DIAGNOSIS — D509 Iron deficiency anemia, unspecified: Secondary | ICD-10-CM

## 2011-10-02 DIAGNOSIS — Z20828 Contact with and (suspected) exposure to other viral communicable diseases: Secondary | ICD-10-CM | POA: Insufficient documentation

## 2011-10-02 DIAGNOSIS — E118 Type 2 diabetes mellitus with unspecified complications: Secondary | ICD-10-CM | POA: Insufficient documentation

## 2011-10-02 DIAGNOSIS — Z205 Contact with and (suspected) exposure to viral hepatitis: Secondary | ICD-10-CM

## 2011-10-02 DIAGNOSIS — K7689 Other specified diseases of liver: Secondary | ICD-10-CM

## 2011-10-02 DIAGNOSIS — D518 Other vitamin B12 deficiency anemias: Secondary | ICD-10-CM | POA: Insufficient documentation

## 2011-10-02 DIAGNOSIS — D6489 Other specified anemias: Secondary | ICD-10-CM

## 2011-10-02 LAB — COMPREHENSIVE METABOLIC PANEL
BUN: 15 mg/dL (ref 6–23)
CO2: 27 mEq/L (ref 19–32)
Chloride: 99 mEq/L (ref 96–112)
Creatinine, Ser: 0.93 mg/dL (ref 0.50–1.35)
GFR calc non Af Amer: >90 mL/min (ref >90)
Glucose, Bld: 265 mg/dL — ABNORMAL HIGH (ref 70–99)
Total Bilirubin: 0.5 mg/dL (ref 0.3–1.2)

## 2011-10-02 LAB — CBC
Hemoglobin: 13.5 g/dL (ref 13.0–17.0)
MCH: 31.6 pg (ref 26.0–34.0)
MCV: 95.1 fL (ref 78.0–100.0)
RBC: 4.27 MIL/uL (ref 4.22–5.81)

## 2011-10-02 NOTE — Progress Notes (Signed)
Labs drawn today for cmp,cbc, ferr,hepatitis panel

## 2011-10-03 ENCOUNTER — Encounter (HOSPITAL_COMMUNITY): Payer: Medicaid Other

## 2011-10-03 LAB — HEPATITIS PANEL, ACUTE
HCV Ab: NEGATIVE
Hepatitis B Surface Ag: NEGATIVE

## 2011-10-07 ENCOUNTER — Encounter (HOSPITAL_BASED_OUTPATIENT_CLINIC_OR_DEPARTMENT_OTHER): Payer: Medicaid Other | Admitting: Oncology

## 2011-10-07 ENCOUNTER — Encounter (HOSPITAL_COMMUNITY): Payer: Self-pay | Admitting: Oncology

## 2011-10-07 DIAGNOSIS — D6489 Other specified anemias: Secondary | ICD-10-CM

## 2011-10-07 DIAGNOSIS — E118 Type 2 diabetes mellitus with unspecified complications: Secondary | ICD-10-CM

## 2011-10-07 DIAGNOSIS — D6959 Other secondary thrombocytopenia: Secondary | ICD-10-CM

## 2011-10-07 DIAGNOSIS — K7460 Unspecified cirrhosis of liver: Secondary | ICD-10-CM

## 2011-10-07 DIAGNOSIS — G2581 Restless legs syndrome: Secondary | ICD-10-CM

## 2011-10-07 DIAGNOSIS — D649 Anemia, unspecified: Secondary | ICD-10-CM

## 2011-10-07 DIAGNOSIS — D518 Other vitamin B12 deficiency anemias: Secondary | ICD-10-CM

## 2011-10-07 DIAGNOSIS — D696 Thrombocytopenia, unspecified: Secondary | ICD-10-CM

## 2011-10-07 DIAGNOSIS — K922 Gastrointestinal hemorrhage, unspecified: Secondary | ICD-10-CM

## 2011-10-07 DIAGNOSIS — K7689 Other specified diseases of liver: Secondary | ICD-10-CM

## 2011-10-07 DIAGNOSIS — D509 Iron deficiency anemia, unspecified: Secondary | ICD-10-CM

## 2011-10-07 MED ORDER — SODIUM CHLORIDE 0.9 % IV SOLN: 1020.0000 mg | Freq: Once | INTRAVENOUS | Status: DC

## 2011-10-07 MED ORDER — TRAMADOL HCL 50 MG PO TABS: 50.0000 mg | ORAL_TABLET | Freq: Two times a day (BID) | ORAL | Status: DC | PRN

## 2011-10-07 NOTE — Progress Notes (Signed)
[name],[name], MD [address] [place] [address]  1. RESTLESS LEG SYNDROME  traMADol (ULTRAM) 50 MG tablet, DISCONTINUED: traMADol (ULTRAM) 50 MG tablet  2. DIABETES MELLITUS, TYPE II, CONTROLLED, WITH COMPLICATIONS  Ambulatory referral to Endocrinology  3. ANEMIA-IRON DEFICIENCY  CBC, Ferritin, CBC, Ferritin, CBC, Ferritin  4. HEPATIC CIRRHOSIS  Comprehensive metabolic panel  5. Other chronic nonalcoholic liver disease  Comprehensive metabolic panel  6. 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 Anemia due to multiple mechanisms  CBC, Ferritin, CBC, Ferritin, CBC, Differential, Comprehensive metabolic panel, Ferritin  9. Iron deficiency anemia  Ferritin, Ferritin, Ferritin    CURRENT THERAPY:Intermittent Feraheme infusions as needed  INTERVAL HISTORY: Kyle [name] 55 y.o. male returns for  regular  visit for followup of anemia of multiple mechanisms   The patient reports that his primary care doctor has left town and he is therefore actively searching for a new PCP. He reports that he has an appointment with a primary care physician in the very near future.  Of course, the patient continues to have his chronic complaints including fatigue. Of note he does complain of restless leg syndrome which is a chronic issue for him. Reports that his primary care physician was treating that with the gabapentin and Ultram. Unfortunately due to the events of his primary care physician leaving town, he is been out of his all Ultram for over one month. I refilled this one time only until he finds his new primary care physician.  He takes tramadol 50 mg twice daily as needed for restless-like syndrome.  I personally reviewed and went over laboratory results with the patient. The patient understands that his ferritin has declined to 27 compared to 110 last month. As result he'll require a Feraheme infusion. He reports this coming Friday would be a good  day for that to be scheduled. I will try to coordinate that with our nursing staff. He will receive the 1020 mg a Feraheme IV. The patient explains that he had an idea that his ferritin was low because he reports that when his ferritin is low, his legs begin to bother him more. Patient also understands that his blood counts were fairly stable. His white blood cell count is within normal limits along with his hemoglobin and hematocrit. His platelet count continues to be low at 64,000 due to the cirrhosis of the liver. Of note, his glucose was 265. He is on diabetes medications. This is managed by a physician in [place]. However, the patient reports that travel expenses to [place] or taking its whole on the family. As result he asked if there is an endocrinologist in [place]. I gladly referred him to Dr. Fransico Him.   Otherwise the patient is doing well. I explained to him the importance of him having his glucose under control.  Other chronic issue the patient mentions as his cough. It is likely secondary to discontinuation of tobacco abuse. He reports that occasionally he gets to "hacking" while he is on the toilet having a bowel movement which causes him to "dry heaves" into a trashcan located in the bathroom. I explained to him that this is likely due to his continuation of tobacco smoking.  He reports that the vomitus is not bile but rather clear sputum.  Past Medical History  Diagnosis Date  . DM (diabetes mellitus)   . Cirrhosis     bx proven steatohepatitis with cirrhosis (2010)  .  HTN (hypertension)   . RLS (restless legs syndrome)   . Sleep apnea   . Hyperlipidemia   . IDA (iron deficiency anemia)   . GERD (gastroesophageal reflux disease)   . Depression   . Peripheral neuropathy   . Urothelial cancer     2010, paillary low-grade, h/o recurrence 2011  . B12 deficiency   . Psoriasis   . 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 Anemia due to multiple mechanisms 05/13/2011  .  History of alcohol abuse 05/13/2011  . ANEMIA-IRON DEFICIENCY 03/09/2009  . S/P endoscopy May 2012    4 columns grade II esophageal varices; due for repeat in Nov 2013   . S/P colonoscopy May 2012    Tubular adenoma    has BLADDER CANCER; DIABETES MELLITUS, TYPE II, CONTROLLED, WITH COMPLICATIONS; ADRENAL MASS; HYPERLIPIDEMIA; ANEMIA-IRON DEFICIENCY; ANEMIA, VITAMIN B12 DEFICIENCY; SMOKER; DEPRESSION; SLEEP APNEA, OBSTRUCTIVE, MODERATE; RESTLESS LEG SYNDROME; HYPERTENSION; GERD; HEPATIC CIRRHOSIS; OTHER CHRONIC NONALCOHOLIC LIVER DISEASE; HEMOCCULT POSITIVE STOOL; GI BLEEDING; ARTHRITIS; SHOULDER PAIN, LEFT; INSOMNIA; FATIGUE; WEIGHT GAIN; CHEST DISCOMFORT; PROTEINURIA; ABNORMAL ELECTROCARDIOGRAM; Thrombocytopenia due to hypersplenism; Anemia due to multiple mechanisms; History of alcohol abuse; and Iron deficiency anemia on his problem list.     is allergic to tylenol.  [name] had no medications administered during this visit.  Past Surgical History  Procedure Date  . Carpel tunnel   . Shoulder surgery   . Adrenal mass surgery 05/2009    benign, left  . Bladder surgery 01/2009 and 06/2010    cancer 2010, small recurrence in 06/2010  . Colonoscopy 04/2009    moderate int hemorrhoids, rare sigmoid diverticula, one mm sessile hyperplastic rectal polyp  . Esophagogastroduodenoscopy 03/2009    small hh  . Small bowel capsule endoscopy 03/2009    couple of small benign appearing erosions, nonbleeding  . Egd/tcs 08/2007    small hiatal hernia, pancolonic diverticula, friable anal canal, 3cm salmon colored epithelium in distal esophagus, bx negative for Barrett's  . Skin cancer excision Oct 2012    left arm    Denies any headaches, dizziness, double vision, fevers, chills, night sweats, nausea, diarrhea, constipation, chest pain, heart palpitations, shortness of breath, blood in stool, black tarry stool, urinary pain, urinary burning, urinary frequency, hematuria.   PHYSICAL  EXAMINATION  ECOG PERFORMANCE STATUS: 1 - Symptomatic but completely ambulatory  Filed Vitals:   10/07/11 1057  BP: 125/74  Pulse: 83  Temp: 98 F (36.7 C)    GENERAL:alert, no distress, well nourished, well developed, comfortable, cooperative, obese and smiling SKIN: skin color, texture, turgor are normal HEAD: Normocephalic EYES: normal EARS: External ears normal OROPHARYNX:mucous membranes are moist  NECK: supple, trachea midline LYMPH:  not examined BREAST:not examined LUNGS: clear to auscultation and percussion, decreased breath sounds throughout  HEART: regular rate & rhythm, no murmurs, no gallops, S1 normal and S2 normal ABDOMEN:abdomen soft, non-tender, obese, normal bowel sounds and difficult to assess for hepatosplenomegaly due to body habitus.  BACK: Back symmetric, no curvature., No CVA tenderness EXTREMITIES:less then 2 second capillary refill, no joint deformities, effusion, or inflammation, no edema, no skin discoloration, no clubbing, no cyanosis  NEURO: alert & oriented x 3 with fluent speech, no focal motor/sensory deficits, gait normal   LABORATORY DATA: CBC    Component Value Date/Time   WBC 4.5 10/02/2011 0845   RBC 4.27 10/02/2011 0845   HGB 13.5 10/02/2011 0845   HCT 40.6 10/02/2011 0845   PLT 64* 10/02/2011 0845   MCV 95.1 10/02/2011 0845  MCH 31.6 10/02/2011 0845   MCHC 33.3 10/02/2011 0845   RDW 14.2 10/02/2011 0845   LYMPHSABS 1.0 07/17/2011 1102   MONOABS 0.8 07/17/2011 1102   EOSABS 0.0 07/17/2011 1102   BASOSABS 0.0 07/17/2011 1102      Chemistry      Component Value Date/Time   NA 135 10/02/2011 0845   K 4.2 10/02/2011 0845   CL 99 10/02/2011 0845   CO2 27 10/02/2011 0845   BUN 15 10/02/2011 0845   CREATININE 0.93 10/02/2011 0845   CREATININE 1.18 03/26/2011 0912      Component Value Date/Time   CALCIUM 10.0 10/02/2011 0845   ALKPHOS 89 10/02/2011 0845   AST 53* 10/02/2011 0845   ALT 52 10/02/2011 0845   BILITOT 0.5 10/02/2011 0845     Lab  Results  Component Value Date   FERRITIN 27 10/02/2011      ASSESSMENT:  1. Multifactorial anemia  2. Iron deficiency anemia  3. Liver disease with fatty infiltration  4. Obesity  5. Probably lung disease secondary to longstanding smoking history.   PLAN:  1. I personally reviewed and went over laboratory results with the patient. 2. Referral to Dr. Fransico Him, Endocrinologist, for DM control 3. Ultram 50 mg #60 take 2 tablets PO, PRN.  This was given to the patient while he finds a new PCP.  Further refills of this medication will need to come from his PCP. 4. He has an appointment upcoming with a new PCP.  He original PCP has left town. 5. Will administer Feraheme 1020 mg this week or next week. 6. Lab work monthly: CBC, ferritin 7. Lab work in 3 months: CBC diff, Ferritin, CMET 8. Return in 3 months for follow-up   All questions were answered. The patient knows to call the clinic with any problems, questions or concerns. We can certainly see the patient much sooner if necessary.  The patient and plan discussed with Glenford Peers, MD and he is in agreement with the aforementioned.  I spent 25 minutes counseling the patient face to face. The total time spent in the appointment was 30 minutes.  KEFALAS,THOMAS

## 2011-10-07 NOTE — Patient Instructions (Signed)
[facility]  Discharge Instructions  RECOMMENDATIONS MADE BY THE CONSULTANT AND ANY TEST RESULTS WILL BE SENT TO YOUR REFERRING DOCTOR.   EXAM FINDINGS BY MD TODAY AND SIGNS AND SYMPTOMS TO REPORT TO CLINIC OR PRIMARY MD: Exam per Jenita Seashore PA  Feraheme  Friday at 1015am  INSTRUCTIONS GIVEN AND DISCUSSED: Other  Lab work monthly  SPECIAL INSTRUCTIONS/FOLLOW-UP: Other (Referral/Appointments)  To Dr Fransico Him for endocrinology for diabetes.   I acknowledge that I have been informed and understand all the instructions given to me and received a copy. I do not have any more questions at this time, but understand that I may call the Specialty Clinic at [facility] at [phone] during business hours should I have any further questions or need assistance in obtaining follow-up care.    __________________________________________  _____________  __________ Signature of Patient or Authorized Representative            Date                   Time    __________________________________________ Nurse's Signature

## 2011-10-10 ENCOUNTER — Encounter (HOSPITAL_BASED_OUTPATIENT_CLINIC_OR_DEPARTMENT_OTHER): Payer: Medicaid Other

## 2011-10-10 DIAGNOSIS — D509 Iron deficiency anemia, unspecified: Secondary | ICD-10-CM

## 2011-10-10 MED ORDER — SODIUM CHLORIDE 0.9 % IJ SOLN
INTRAMUSCULAR | Status: AC
  Filled 2011-10-10: qty 10

## 2011-10-10 MED ORDER — SODIUM CHLORIDE 0.9 % IV SOLN
INTRAVENOUS | Status: DC
  Administered 2011-10-10: 11:00:00 via INTRAVENOUS

## 2011-10-10 MED ORDER — SODIUM CHLORIDE 0.9 % IV SOLN
1020.0000 mg | Freq: Once | INTRAVENOUS | Status: AC
  Administered 2011-10-10: 1020 mg via INTRAVENOUS
  Filled 2011-10-10: qty 34

## 2011-10-10 NOTE — Progress Notes (Signed)
Infusion complete, patient tolerated well.   

## 2011-10-20 NOTE — Progress Notes (Signed)
REVIEWED.  

## 2011-10-20 NOTE — Progress Notes (Signed)
Put HEP ABC SEROLOGY RESULTS IN HISTORY

## 2011-10-21 ENCOUNTER — Encounter: Payer: Self-pay | Admitting: Gastroenterology

## 2011-10-22 NOTE — Progress Notes (Signed)
Done in 2010, noted under immunizations.

## 2011-11-04 ENCOUNTER — Encounter: Payer: Self-pay | Admitting: Gastroenterology

## 2011-11-04 ENCOUNTER — Ambulatory Visit (INDEPENDENT_AMBULATORY_CARE_PROVIDER_SITE_OTHER): Payer: Medicaid Other | Admitting: Gastroenterology

## 2011-11-04 DIAGNOSIS — K7460 Unspecified cirrhosis of liver: Secondary | ICD-10-CM

## 2011-11-04 DIAGNOSIS — K644 Residual hemorrhoidal skin tags: Secondary | ICD-10-CM | POA: Insufficient documentation

## 2011-11-04 DIAGNOSIS — D509 Iron deficiency anemia, unspecified: Secondary | ICD-10-CM

## 2011-11-04 DIAGNOSIS — R109 Unspecified abdominal pain: Secondary | ICD-10-CM | POA: Insufficient documentation

## 2011-11-04 MED ORDER — HYDROCORTISONE 2.5 % RE CREA: TOPICAL_CREAM | Freq: Two times a day (BID) | RECTAL | Status: AC

## 2011-11-04 NOTE — Assessment & Plan Note (Signed)
Thorough work-up this year. Continues to see Dr. Mariel Sleet.

## 2011-11-04 NOTE — Assessment & Plan Note (Addendum)
2 non-thrombosed hemorrhoids noted on rectal exam, non-bleeding, internal exam with only mild discomfort, no masses noted. Will treat with Anusol cream BID X 10 days.

## 2011-11-04 NOTE — Progress Notes (Unsigned)
Pt seen today in clinic. Reports RLQ pain, worsening over past few weeks. He is actually scheduled to have a CT scan via his urologist towards the end of the month. However, if possible, can we contact them and see exactly what they have ordered or will be ordering? I'd like to go ahead and proceed with this for the patient, as he has to have one anyway. We can send them the results when this is completed if they are agreeable to this.  [facility] in [place]: phone # [phone]. Urologist: Dr. Heloise Purpura.    See if we can talk to his nurse and find out the specifics of what they will be ordering. Then, we can just do it all at one time to avoid unnecessary testing. Would like to get done this week.   Thanks!

## 2011-11-04 NOTE — Progress Notes (Signed)
Referring Provider: Isac Sarna, MD Primary Care Physician:  Redmond Baseman, MD, MD Primary Gastroenterologist: Dr. Jena Gauss   Chief Complaint  Patient presents with  . Abdominal Pain    feelslike a knife in his side    HPI:   Kyle [name] returns today in f/u, hx of [name] cirrhosis, IDA, EGD/colnoscopy recently performed Jan 2012. Due for surveillance EGD Nov 2013.  Returns today with a 2 week hx of pain originating in RLQ, extends across bottom of abdomen, feels like a knife stabbing. No precipitating factors, happens daily. No fever, chills, N/V. No change in bowel habits. No diarrhea. Not exacerbated or relieved by movement. Took hydrocodone and noted complete relief. No brbpr. Denies any urinary symptoms currently.  Due for CT end of December with urologist.  Also reports sensation of "blistered" rectum. No pruritis. Using Prep H with mild relief.   Past Medical History  Diagnosis Date  . DM (diabetes mellitus)   . Cirrhosis     bx proven steatohepatitis with cirrhosis (2010); per note from Feb 2011, received Hep A and B vaccines in 2010  . HTN (hypertension)   . RLS (restless legs syndrome)   . Sleep apnea   . Hyperlipidemia   . IDA (iron deficiency anemia)   . GERD (gastroesophageal reflux disease)   . Depression   . Peripheral neuropathy   . Urothelial cancer     2010, paillary low-grade, h/o recurrence 2011  . B12 deficiency   . Psoriasis   . 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 Anemia due to multiple mechanisms 05/13/2011  . History of alcohol abuse 05/13/2011  . ANEMIA-IRON DEFICIENCY 03/09/2009  . S/P endoscopy May 2012    4 columns grade II esophageal varices; due for repeat in Nov 2013   . S/P colonoscopy May 2012    Tubular adenoma  . Low back pain     Past Surgical History  Procedure Date  . Carpel tunnel   . Shoulder surgery   . Adrenal mass surgery 05/2009    benign, left  . Bladder surgery 01/2009 and 06/2010    cancer 2010, small  recurrence in 06/2010  . Colonoscopy 04/2009    moderate int hemorrhoids, rare sigmoid diverticula, one mm sessile hyperplastic rectal polyp  . Esophagogastroduodenoscopy 03/2009    small hh  . Small bowel capsule endoscopy 03/2009    couple of small benign appearing erosions, nonbleeding  . Egd/tcs 08/2007    small hiatal hernia, pancolonic diverticula, friable anal canal, 3cm salmon colored epithelium in distal esophagus, bx negative for Barrett's  . Skin cancer excision Oct 2012    left arm    Current Outpatient Prescriptions  Medication Sig Dispense Refill  . aspirin 81 MG tablet Take 81 mg by mouth daily.        [name] atenolol (TENORMIN) 100 MG tablet Take 100 mg by mouth daily.        . clotrimazole (LOTRIMIN) 1 % cream Apply 1 application topically 2 (two) times daily as needed. psoriasis      . fish oil-omega-3 fatty acids 1000 MG capsule Take 2 g by mouth daily.        . Fluticasone-Salmeterol (ADVAIR) 250-50 MCG/DOSE AEPB Inhale 1 puff into the lungs every 12 (twelve) hours as needed. For shortness of breath       . furosemide (LASIX) 20 MG tablet Take 20 mg by mouth daily.       [name] gabapentin (NEURONTIN) 300 MG capsule Take 1,500 mg by mouth  daily.       . hydrochlorothiazide (,MICROZIDE/HYDRODIURIL,) 12.5 MG capsule Take 12.5 mg by mouth daily.        . insulin aspart (NOVOLOG) 100 UNIT/ML injection Inject 20 Units into the skin 2 (two) times daily. Sliding Scale      . insulin glargine (LANTUS) 100 UNIT/ML injection Inject 100 Units into the skin at bedtime.       [name] lisinopril (PRINIVIL,ZESTRIL) 20 MG tablet Take 10 mg by mouth daily.       . magnesium oxide (MAG-OX) 400 MG tablet Take 400 mg by mouth daily.        . metFORMIN (GLUCOPHAGE) 1000 MG tablet Take 1,000 mg by mouth 2 (two) times daily with a meal.        . metFORMIN (GLUMETZA) 1000 MG (MOD) 24 hr tablet Take 1,000 mg by mouth daily with breakfast.        . Multiple Vitamin (MULTIVITAMIN) capsule Take 1 capsule by mouth  daily.        [name] omeprazole (PRILOSEC) 40 MG capsule Take 40 mg by mouth daily.        . pramipexole (MIRAPEX) 0.5 MG tablet Take 0.5 mg by mouth at bedtime.       . traMADol (ULTRAM) 50 MG tablet Take 1 tablet (50 mg total) by mouth 2 (two) times daily as needed. For pain  60 tablet  0  . traZODone (DESYREL) 50 MG tablet Take 100 mg by mouth at bedtime.       . hydrocortisone (ANUSOL-HC) 2.5 % rectal cream Place rectally 2 (two) times daily. For 10 days  30 g  0    Allergies as of 11/04/2011 - Review Complete 11/04/2011  Allergen Reaction Noted  . Tylenol (acetaminophen) Other (See Comments) 07/17/2011    Family History  Problem Relation Age of Onset  . Cirrhosis Father     etoh  . Colon cancer Neg Hx   . Kidney cancer Mother   . Cancer Mother   . HIV Brother   . Cirrhosis Brother     nash    History   Social History  . Marital Status: Married    Spouse Name: N/A    Number of Children: 3  . Years of Education: N/A   Occupational History  . disabled    Social History Main Topics  . Smoking status: Current Everyday Smoker -- 1.0 packs/day    Types: Cigarettes  . Smokeless tobacco: None  . Alcohol Use: No     drank heavily for few years in 20s  . Drug Use: No  . Sexually Active: None   Other Topics Concern  . None   Social History Narrative  . None    Review of Systems: Gen: Denies fever, chills, anorexia. Denies fatigue, weakness, weight loss.  CV: Denies chest pain, palpitations, syncope, peripheral edema, and claudication. Resp: Denies dyspnea at rest, cough, wheezing, coughing up blood, and pleurisy. GI: Denies vomiting blood, jaundice, and fecal incontinence.   Denies dysphagia or odynophagia. Derm: Denies rash, itching, dry skin Psych: Denies depression, anxiety, memory loss, confusion. No homicidal or suicidal ideation.  Heme: Denies bruising, bleeding, and enlarged lymph nodes.  Physical Exam: BP 123/81  Pulse 79  Temp(Src) 97.4 F (36.3 C)  (Temporal)  Ht 5\' 7"  (1.702 m)  Wt 251 lb (113.853 kg)  BMI 39.31 kg/m2 General:   Alert and oriented. No distress noted. Pleasant and cooperative.  Head:  Normocephalic and atraumatic. Eyes:  Conjuctiva clear  without scleral icterus. Mouth:  Oral mucosa pink and moist. Good dentition. No lesions. Heart:  S1, S2 present without murmurs, rubs, or gallops. Regular rate and rhythm. Abdomen:  +BS, soft, mildly TTP RLQ and non-distended. No rebound or guarding. No HSM or masses noted. Rash noted to right of umbilicus, scaly, white, patchy. Sees dermatologist today Msk:  Symmetrical without gross deformities. Normal posture. Extremities:  Without edema. Neurologic:  Alert and  oriented x4;  grossly normal neurologically. Skin:  Intact without significant lesions or rashes. Psych:  Alert and cooperative. Normal mood and affect.

## 2011-11-04 NOTE — Assessment & Plan Note (Addendum)
[name] cirrhosis. Stable. Surveillance for esophageal varices due Nov 2013. HCC screening April 2013 to include AFP and [place]. STILL NEEDS to be on non-selective beta-blocker. Has been unable to establish continuity of care with PCP recently. Will need to draft letter to Dr. Modesto Charon regarding need for different beta-blocker. Pt has received Hep A and B vaccines in 2010.   3 mos f/u

## 2011-11-04 NOTE — Assessment & Plan Note (Signed)
55 year old male who returns with 2 week hx of RLQ abdominal pain, extending across lower abdomen, described as a knife-like sensation. No other associated symptoms such as fever, chills, N/V, loss of appetite, change in bowel habits. Unclear etiology at this time. Question functional abdominal pain, due for CT with urology end of month. We will contact [facility] to proceed with CT sooner and ensure pt does not undergo any unnecessary tests from our standpoint, as he will already be proceeding with a CT anyway in the future. Doubt acute etiology. Pt does not appear ill; he is in good spirits. Further recommendations after CT is completed.

## 2011-11-04 NOTE — Patient Instructions (Signed)
We have sent a prescription for hemorrhoid cream to your pharmacy. Your insurance does not cover suppositories. Take this twice a day for 10 days.  We also will be setting up a CT scan in the near future after talking with your urologist, as he will be proceeding with one anyway in a few weeks. This will keep you from having any unnecessary repeated procedures.  We will see you back in 3 mos or sooner as needed

## 2011-11-05 ENCOUNTER — Other Ambulatory Visit: Payer: Self-pay | Admitting: Gastroenterology

## 2011-11-05 DIAGNOSIS — R1031 Right lower quadrant pain: Secondary | ICD-10-CM

## 2011-11-05 NOTE — Progress Notes (Unsigned)
Spoke with Herbert Seta, Dr. Vevelyn Royals nurse. They have pt already scheduled for CT abd/pelvis with and without IV contrast on Dec 28th. No other special protocols needed per Heather. I asked if it would be ok for [place] to go ahead and order that now, as he is having RLQ pain, as he is due for a CT anyway. We will fax the results to Dr. Vevelyn Royals office once complete. She was agreeable to this and had no issues with this.

## 2011-11-05 NOTE — Progress Notes (Signed)
Cc to PCP 

## 2011-11-05 NOTE — Progress Notes (Signed)
Called to pre cert CT- it is up for review- will have an answer in 24hrs

## 2011-11-05 NOTE — Progress Notes (Signed)
Routed to CD

## 2011-11-07 ENCOUNTER — Other Ambulatory Visit (HOSPITAL_COMMUNITY): Payer: Self-pay | Admitting: Oncology

## 2011-11-07 ENCOUNTER — Encounter (HOSPITAL_COMMUNITY): Payer: Medicaid Other | Attending: Oncology

## 2011-11-07 DIAGNOSIS — D6959 Other secondary thrombocytopenia: Secondary | ICD-10-CM | POA: Insufficient documentation

## 2011-11-07 DIAGNOSIS — D509 Iron deficiency anemia, unspecified: Secondary | ICD-10-CM

## 2011-11-07 DIAGNOSIS — D649 Anemia, unspecified: Secondary | ICD-10-CM | POA: Insufficient documentation

## 2011-11-07 DIAGNOSIS — K922 Gastrointestinal hemorrhage, unspecified: Secondary | ICD-10-CM | POA: Insufficient documentation

## 2011-11-07 DIAGNOSIS — D6489 Other specified anemias: Secondary | ICD-10-CM

## 2011-11-07 LAB — CBC
HCT: 42.4 % (ref 39.0–52.0)
Hemoglobin: 14 g/dL (ref 13.0–17.0)
RBC: 4.35 MIL/uL (ref 4.22–5.81)

## 2011-11-07 LAB — FERRITIN: Ferritin: 103 ng/mL (ref 22–322)

## 2011-11-07 NOTE — Progress Notes (Signed)
Labs drawn today for cbc,ferr 

## 2011-11-10 NOTE — Progress Notes (Signed)
Pt is scheduled to have ct abd/pelvis with contrast on 11/13/11@1 :15p.m  I called pt and lmom for a returned call.

## 2011-11-10 NOTE — Progress Notes (Signed)
Case# [id number] was denied.  I scheduled a peer to peer review for Kyle [name] today with the MD at [employer].

## 2011-11-10 NOTE — Progress Notes (Unsigned)
We have attempted to order CT for this pt. However, it is up for review with insurance as of last week. I am unsure of the status now. If needed, I can talk with them. There should be no reason they deny this, especially with his hx of bladder cancer.   Can we find out the status?   I have called both his home # and cell phone # to update him on the status of this CT. I was unable to reach him.

## 2011-11-10 NOTE — Progress Notes (Unsigned)
Discussed with MD via peer to peer.  CT has been approved, WITH contrast. (Not with and without).   Approval code: [id number]

## 2011-11-11 ENCOUNTER — Other Ambulatory Visit: Payer: Self-pay | Admitting: Gastroenterology

## 2011-11-11 NOTE — Progress Notes (Signed)
Pt returned my call- he is aware of CT on 12/20 and will get blood work before

## 2011-11-11 NOTE — Progress Notes (Signed)
Called both #- had to leave a message

## 2011-11-11 NOTE — Progress Notes (Unsigned)
Sent to [employer] for follow-up

## 2011-11-12 ENCOUNTER — Telehealth: Payer: Self-pay

## 2011-11-12 ENCOUNTER — Encounter: Payer: Self-pay | Admitting: Gastroenterology

## 2011-11-12 LAB — CREATININE, SERUM: Creat: 1.03 mg/dL (ref 0.50–1.35)

## 2011-11-12 NOTE — Telephone Encounter (Signed)
Called pt LMOM to call back. 

## 2011-11-12 NOTE — Telephone Encounter (Signed)
If pain is this severe, needs to present to ED. They will likely proceed with CT at that time.

## 2011-11-12 NOTE — Telephone Encounter (Signed)
Pt called wanting pain medicine. He has no fever or diarrhea. The pain is in the lower pain of his stomach and the pain is an 11 on the scale. He has tried aleve but is not helping. He stated that he is in a lot pain and it is severe.Please advised

## 2011-11-13 ENCOUNTER — Ambulatory Visit (HOSPITAL_COMMUNITY)
Admission: RE | Admit: 2011-11-13 | Discharge: 2011-11-13 | Disposition: A | Discharge status: Home / Self Care | Payer: Medicaid Other | Source: Ambulatory Visit | Attending: Gastroenterology | Admitting: Gastroenterology

## 2011-11-13 ENCOUNTER — Encounter (HOSPITAL_COMMUNITY): Payer: Self-pay

## 2011-11-13 DIAGNOSIS — R933 Abnormal findings on diagnostic imaging of other parts of digestive tract: Secondary | ICD-10-CM | POA: Insufficient documentation

## 2011-11-13 DIAGNOSIS — R599 Enlarged lymph nodes, unspecified: Secondary | ICD-10-CM | POA: Insufficient documentation

## 2011-11-13 DIAGNOSIS — R1031 Right lower quadrant pain: Secondary | ICD-10-CM | POA: Insufficient documentation

## 2011-11-13 DIAGNOSIS — R161 Splenomegaly, not elsewhere classified: Secondary | ICD-10-CM | POA: Insufficient documentation

## 2011-11-13 HISTORY — DX: Malignant (primary) neoplasm, unspecified: C80.1

## 2011-11-13 MED ORDER — IOHEXOL 300 MG/ML  SOLN
100.0000 mL | Freq: Once | INTRAMUSCULAR | Status: AC | PRN
  Administered 2011-11-13: 100 mL via INTRAVENOUS

## 2011-11-14 ENCOUNTER — Other Ambulatory Visit: Payer: Self-pay | Admitting: Gastroenterology

## 2011-11-14 ENCOUNTER — Telehealth: Payer: Self-pay | Admitting: Internal Medicine

## 2011-11-14 MED ORDER — METRONIDAZOLE 500 MG PO TABS: 500.0000 mg | ORAL_TABLET | Freq: Three times a day (TID) | ORAL | Status: DC

## 2011-11-14 MED ORDER — CIPROFLOXACIN HCL 500 MG PO TABS: 500.0000 mg | ORAL_TABLET | Freq: Two times a day (BID) | ORAL | Status: DC

## 2011-11-14 NOTE — Telephone Encounter (Signed)
Pt said he had his CT yesterday and was told to call us today to get his results so he could get his pain pill prescription filled. Please call patient at [phone]

## 2011-11-14 NOTE — Telephone Encounter (Signed)
Routing to Ginger who spoke with pt.

## 2011-11-14 NOTE — Telephone Encounter (Signed)
I took care of this.

## 2011-11-14 NOTE — Progress Notes (Signed)
Quick Note:  CT abd/pelvis performed secondary to RLQ pain. [facility] in [place] had planned on CT to f/u hx of bladder ca, scheduled for late Dec. They are aware we have already ordered this. Please send results to Dr. Heloise Purpura in [place]. Phone #: [phone]   PAST CT results: July 2011 CT noted enlarged right external iliac lymph node 8m.   Aug 2012 CT noted periportal adenopathy is little changed, largest nodes measuring2.4 x 1.5 cm image 34 and 3.4 x 1.8 cm image 39, as well as underdistended colon at multiple sites, areas of questionable wall thickening which are potentially artifactual in origin.   CURRENT Dec 2012 CT: Enlarged external iliac lymph nodes measuring 2.2 x 1.2 cm on the Right (in July 2011 1m) and 2 x 1 cm on the left without significant change.  Pelvic sidewall lymph nodes measure 1.7 x 0.8 cm on the right and1.4 x 0.9 cm on the left without change.  Mild progression of periportal adenopathy with transverse dimension of 3.8 x 1.8 cm versus prior 3.4 x 1.8 cm.  No appendicitis. Distal esophagus appears thickened. Stomach contracted and rectosigmoid region underdistended. Question underdistension vs colitis, esophagitis, gastritis.   EGD/TCS performed in May 2012. He was treated for possible colitis in August 2012 with Cipro.   1. Need to ensure he is taking a PPI daily.  2. He denied diarrhea at his appt; however, let's ask again. If so or even more frequency of stools/loose, we need Cdiff PCR and culture. 3. Will be reviewing this scan with Dr. Tyron Russell next week upon return due to lymphadenopathy.  4. Add probiotic daily. 5. In interim, empiric abx for 7 days in case we are dealing with colitis. Presentation is not typical, but he does have abdominal pain and question of colitis. Please inform pt that abx have been called into his pharmacy for a total of 7 days. Seek medical attention if worsening abdominal pain, N/V, fever/chills.  6. Finally,  make sure Urologist has copy of this CT>   ______

## 2011-11-15 ENCOUNTER — Emergency Department (HOSPITAL_COMMUNITY)
Admission: EM | Admit: 2011-11-15 | Discharge: 2011-11-15 | Disposition: A | Discharge status: Home / Self Care | Payer: Medicaid Other | Attending: Emergency Medicine | Admitting: Emergency Medicine

## 2011-11-15 ENCOUNTER — Emergency Department (HOSPITAL_COMMUNITY): Payer: Medicaid Other

## 2011-11-15 ENCOUNTER — Encounter (HOSPITAL_COMMUNITY): Payer: Self-pay | Admitting: *Deleted

## 2011-11-15 DIAGNOSIS — I10 Essential (primary) hypertension: Secondary | ICD-10-CM | POA: Insufficient documentation

## 2011-11-15 DIAGNOSIS — F1021 Alcohol dependence, in remission: Secondary | ICD-10-CM | POA: Insufficient documentation

## 2011-11-15 DIAGNOSIS — K7460 Unspecified cirrhosis of liver: Secondary | ICD-10-CM | POA: Insufficient documentation

## 2011-11-15 DIAGNOSIS — G4730 Sleep apnea, unspecified: Secondary | ICD-10-CM | POA: Insufficient documentation

## 2011-11-15 DIAGNOSIS — E785 Hyperlipidemia, unspecified: Secondary | ICD-10-CM | POA: Insufficient documentation

## 2011-11-15 DIAGNOSIS — Z794 Long term (current) use of insulin: Secondary | ICD-10-CM | POA: Insufficient documentation

## 2011-11-15 DIAGNOSIS — Z7982 Long term (current) use of aspirin: Secondary | ICD-10-CM | POA: Insufficient documentation

## 2011-11-15 DIAGNOSIS — R109 Unspecified abdominal pain: Secondary | ICD-10-CM | POA: Insufficient documentation

## 2011-11-15 DIAGNOSIS — Z79899 Other long term (current) drug therapy: Secondary | ICD-10-CM | POA: Insufficient documentation

## 2011-11-15 DIAGNOSIS — F17200 Nicotine dependence, unspecified, uncomplicated: Secondary | ICD-10-CM | POA: Insufficient documentation

## 2011-11-15 DIAGNOSIS — E119 Type 2 diabetes mellitus without complications: Secondary | ICD-10-CM | POA: Insufficient documentation

## 2011-11-15 DIAGNOSIS — K5289 Other specified noninfective gastroenteritis and colitis: Secondary | ICD-10-CM | POA: Insufficient documentation

## 2011-11-15 DIAGNOSIS — F3289 Other specified depressive episodes: Secondary | ICD-10-CM | POA: Insufficient documentation

## 2011-11-15 DIAGNOSIS — G609 Hereditary and idiopathic neuropathy, unspecified: Secondary | ICD-10-CM | POA: Insufficient documentation

## 2011-11-15 DIAGNOSIS — Z8551 Personal history of malignant neoplasm of bladder: Secondary | ICD-10-CM | POA: Insufficient documentation

## 2011-11-15 DIAGNOSIS — K529 Noninfective gastroenteritis and colitis, unspecified: Secondary | ICD-10-CM

## 2011-11-15 DIAGNOSIS — F329 Major depressive disorder, single episode, unspecified: Secondary | ICD-10-CM | POA: Insufficient documentation

## 2011-11-15 DIAGNOSIS — K219 Gastro-esophageal reflux disease without esophagitis: Secondary | ICD-10-CM | POA: Insufficient documentation

## 2011-11-15 DIAGNOSIS — L408 Other psoriasis: Secondary | ICD-10-CM | POA: Insufficient documentation

## 2011-11-15 LAB — URINALYSIS, ROUTINE W REFLEX MICROSCOPIC
Leukocytes, UA: NEGATIVE
Nitrite: NEGATIVE
Specific Gravity, Urine: 1.02 (ref 1.005–1.030)
Urobilinogen, UA: 0.2 mg/dL (ref 0.0–1.0)

## 2011-11-15 LAB — COMPREHENSIVE METABOLIC PANEL
ALT: 60 U/L — ABNORMAL HIGH (ref 0–53)
Calcium: 9.8 mg/dL (ref 8.4–10.5)
GFR calc Af Amer: 87 mL/min — ABNORMAL LOW (ref >90)
Glucose, Bld: 193 mg/dL — ABNORMAL HIGH (ref 70–99)
Sodium: 134 mEq/L — ABNORMAL LOW (ref 135–145)
Total Protein: 8.4 g/dL — ABNORMAL HIGH (ref 6.0–8.3)

## 2011-11-15 LAB — DIFFERENTIAL
Basophils Absolute: 0 10*3/uL (ref 0.0–0.1)
Eosinophils Absolute: 0.1 10*3/uL (ref 0.0–0.7)
Eosinophils Relative: 2 % (ref 0–5)
Lymphs Abs: 1.4 10*3/uL (ref 0.7–4.0)

## 2011-11-15 LAB — CBC
MCH: 32.1 pg (ref 26.0–34.0)
MCHC: 33.3 g/dL (ref 30.0–36.0)
MCV: 96.6 fL (ref 78.0–100.0)
Platelets: 70 10*3/uL — ABNORMAL LOW (ref 150–400)
RDW: 14.2 % (ref 11.5–15.5)

## 2011-11-15 LAB — PROTIME-INR: Prothrombin Time: 15.9 seconds — ABNORMAL HIGH (ref 11.6–15.2)

## 2011-11-15 LAB — LACTIC ACID, PLASMA: Lactic Acid, Venous: 1.6 mmol/L (ref 0.5–2.2)

## 2011-11-15 LAB — URINE MICROSCOPIC-ADD ON

## 2011-11-15 MED ORDER — OXYCODONE HCL 5 MG PO TABS: 10.0000 mg | ORAL_TABLET | ORAL | Status: AC | PRN

## 2011-11-15 MED ORDER — ONDANSETRON HCL 4 MG/2ML IJ SOLN
4.0000 mg | Freq: Once | INTRAMUSCULAR | Status: AC
  Administered 2011-11-15: 4 mg via INTRAVENOUS
  Filled 2011-11-15: qty 2

## 2011-11-15 MED ORDER — SODIUM CHLORIDE 0.9 % IV BOLUS (SEPSIS)
1000.0000 mL | Freq: Once | INTRAVENOUS | Status: AC
  Administered 2011-11-15: 1000 mL via INTRAVENOUS

## 2011-11-15 MED ORDER — OXYCODONE HCL 5 MG PO TABS
5.0000 mg | ORAL_TABLET | Freq: Once | ORAL | Status: AC
  Administered 2011-11-15: 5 mg via ORAL
  Filled 2011-11-15: qty 1

## 2011-11-15 MED ORDER — HYDROMORPHONE HCL PF 1 MG/ML IJ SOLN
2.0000 mg | Freq: Once | INTRAMUSCULAR | Status: AC
  Administered 2011-11-15: 2 mg via INTRAVENOUS
  Filled 2011-11-15: qty 2

## 2011-11-15 MED ORDER — SODIUM CHLORIDE 0.9 % IV SOLN: 999.0000 mL | INTRAVENOUS | Status: DC

## 2011-11-15 NOTE — ED Notes (Signed)
Pt c/o abdominal pain x 1 week. Pt states that he was seen by PCP and then GI MD and had CT. Pt was told that he had an inflamed colon and that if his pain got worse to come to ED. Pt states that the pain is worse. Also c/o nausea.

## 2011-11-15 NOTE — ED Provider Notes (Addendum)
This chart was scribed for Felisa Bonier, MD by Wallis Mart. The patient was seen in room APA12/APA12 and the patient's care was started at 7:28 PM.   CSN: [id number]  Arrival date & time 11/15/11  1813   First MD Initiated Contact with Patient 11/15/11 1916      Chief Complaint  Patient presents with  . Abdominal Pain    (Consider location/radiation/quality/duration/timing/severity/associated sxs/prior treatment) Patient is a 55 y.o. male presenting with abdominal pain. The history is provided by the patient.  Abdominal Pain The primary symptoms of the illness include abdominal pain and nausea. The primary symptoms of the illness do not include vomiting, diarrhea or hematochezia. The current episode started 6 to 12 hours ago. The onset of the illness was gradual. The problem has been gradually worsening.    Pt seen at 7:28 PM Kyle [name] is a 55 y.o. male who presents to the Emergency Department complaining of gradual onset, gradually worsening, constant,  diffuse abdominal pain onset 8 AM this morning.  Pt says the pain feels like being stabbed by a knife. Nothing aggravates or improves the pain. Pt has had lower abdominal pain for the past 2 weeks and was seen in the ED and dx with colitis after a CT scan.  Pt c/o hemoptysis and nausea that began today.  Pt denies vomiting, dysuria, bloody stools.  Pt's last bm was this AM.      PCP: Dr [place] Charon Manson [place]) GI: Dr. Jena Gauss Past Medical History  Diagnosis Date  . DM (diabetes mellitus)   . Cirrhosis     bx proven steatohepatitis with cirrhosis (2010); per note from Feb 2011, received Hep A and B vaccines in 2010  . HTN (hypertension)   . RLS (restless legs syndrome)   . Sleep apnea   . Hyperlipidemia   . IDA (iron deficiency anemia)   . GERD (gastroesophageal reflux disease)   . Depression   . Peripheral neuropathy   . Urothelial cancer     2010, paillary low-grade, h/o recurrence 2011  . B12 deficiency   .  Psoriasis   . 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 Anemia due to multiple mechanisms 05/13/2011  . History of alcohol abuse 05/13/2011  . ANEMIA-IRON DEFICIENCY 03/09/2009  . S/P endoscopy May 2012    4 columns grade II esophageal varices; due for repeat in Nov 2013   . S/P colonoscopy May 2012    Tubular adenoma  . Low back pain   . Cancer     bladder ca 05/2009 removal and  w/chemo wash    Past Surgical History  Procedure Date  . Carpel tunnel   . Shoulder surgery   . Adrenal mass surgery 05/2009    benign, left  . Bladder surgery 01/2009 and 06/2010    cancer 2010, small recurrence in 06/2010  . Colonoscopy 04/2009    moderate int hemorrhoids, rare sigmoid diverticula, one mm sessile hyperplastic rectal polyp  . Esophagogastroduodenoscopy 03/2009    small hh  . Small bowel capsule endoscopy 03/2009    couple of small benign appearing erosions, nonbleeding  . Egd/tcs 08/2007    small hiatal hernia, pancolonic diverticula, friable anal canal, 3cm salmon colored epithelium in distal esophagus, bx negative for Barrett's  . Skin cancer excision Oct 2012    left arm    Family History  Problem Relation Age of Onset  . Cirrhosis Father     etoh  . Colon cancer Neg Hx   .  Kidney cancer Mother   . Cancer Mother   . HIV Brother   . Cirrhosis Brother     nash    History  Substance Use Topics  . Smoking status: Current Everyday Smoker -- 1.0 packs/day    Types: Cigarettes  . Smokeless tobacco: Not on file  . Alcohol Use: No     drank heavily for few years in 20s      Review of Systems  Gastrointestinal: Positive for nausea and abdominal pain. Negative for vomiting, diarrhea and hematochezia.   10 Systems reviewed and are negative for acute change except as noted in the HPI.  Allergies  Tylenol  Home Medications   Current Outpatient Rx  Name Route Sig Dispense Refill  . ASPIRIN 81 MG PO TABS Oral Take 81 mg by mouth daily.      . ATENOLOL 100 MG PO TABS  Oral Take 100 mg by mouth daily.      [name] CIPROFLOXACIN HCL 500 MG PO TABS Oral Take 1 tablet (500 mg total) by mouth 2 (two) times daily. For 7 days 14 tablet 0  . CLOTRIMAZOLE 1 % EX CREA Topical Apply 1 application topically 2 (two) times daily as needed. psoriasis    . OMEGA-3 FATTY ACIDS 1000 MG PO CAPS Oral Take 2 g by mouth daily.      [name] FLUTICASONE-SALMETEROL 250-50 MCG/DOSE IN AEPB Inhalation Inhale 1 puff into the lungs every 12 (twelve) hours as needed. For shortness of breath     . FUROSEMIDE 20 MG PO TABS Oral Take 20 mg by mouth daily.     [name] GABAPENTIN 300 MG PO CAPS Oral Take 1,500 mg by mouth daily.     [name] HYDROCHLOROTHIAZIDE 12.5 MG PO CAPS Oral Take 12.5 mg by mouth daily.      [name] HYDROCORTISONE 2.5 % RE CREA Rectal Place rectally 2 (two) times daily. For 10 days 30 g 0  . INSULIN ASPART 100 UNIT/ML [place] SOLN Subcutaneous Inject 20 Units into the skin 2 (two) times daily. Sliding Scale    . INSULIN GLARGINE 100 UNIT/ML [place] SOLN Subcutaneous Inject 100 Units into the skin at bedtime.     [name] LISINOPRIL 20 MG PO TABS Oral Take 10 mg by mouth daily.     [name] MAGNESIUM OXIDE 400 MG PO TABS Oral Take 400 mg by mouth daily.      [name] METFORMIN HCL 1000 MG PO TABS Oral Take 1,000 mg by mouth 2 (two) times daily with a meal.      . METFORMIN HCL ER (MOD) 1000 MG PO TB24 Oral Take 1,000 mg by mouth daily with breakfast.      . METRONIDAZOLE 500 MG PO TABS Oral Take 1 tablet (500 mg total) by mouth 3 (three) times daily. For 7 days 21 tablet 0  . MULTIVITAMINS PO CAPS Oral Take 1 capsule by mouth daily.      [name] OMEPRAZOLE 40 MG PO CPDR Oral Take 40 mg by mouth daily.      [name] PRAMIPEXOLE DIHYDROCHLORIDE 0.5 MG PO TABS Oral Take 0.5 mg by mouth at bedtime.     . TRAMADOL HCL 50 MG PO TABS Oral Take 1 tablet (50 mg total) by mouth 2 (two) times daily as needed. For pain 60 tablet 0  . TRAZODONE HCL 50 MG PO TABS Oral Take 100 mg by mouth at bedtime.       BP 150/83  Pulse 69  Temp(Src) 97.8 F (36.6 C)  (Oral)  Resp 20  Ht 5\' 7"  (1.702 m)  Wt 245 lb (111.131 kg)  BMI 38.37 kg/m2  SpO2 100%  Physical Exam  Nursing note and vitals reviewed. Constitutional: He is oriented to person, place, and time. He appears well-developed and well-nourished. No distress.  HENT:  Head: Normocephalic and atraumatic.  Mouth/Throat: Oropharynx is clear and moist.  Eyes: EOM are normal. Pupils are equal, round, and reactive to light.  Neck: Neck supple. No tracheal deviation present.  Cardiovascular: Normal rate, regular rhythm and normal heart sounds.   Pulmonary/Chest: Effort normal. No respiratory distress. He has no wheezes. He has no rales.  Abdominal: Soft. Bowel sounds are normal. He exhibits no distension. There is no tenderness.       Diffuse abdominal pain but no tenderness on palpation  Musculoskeletal: Normal range of motion. He exhibits no edema.  Neurological: He is alert and oriented to person, place, and time. No sensory deficit.  Skin: Skin is warm and dry.  Psychiatric: He has a normal mood and affect. His behavior is normal.    ED Course  Procedures (including critical care time) DIAGNOSTIC STUDIES: Oxygen Saturation is 100% on room air, normal by my interpretation.    COORDINATION OF CARE:  10:50 PM After IV fluids and pain medications, the patient's reexamination was performed by me and is significantly improved. The patient is currently comfortable and in no distress, with a pain score of 2-3/10. His laboratory studies and radiographic studies are non-provocative for acute or severe illness. At this time I think that the patient is simply having pain from colitis and I do not see a need for a repeat CT scan as I do not suspect perforation or worsening colitis. I will discharge the patient home with medication for pain. The patient states his understanding of and agreement with this plan of care. He will followup with his gastroenterologist for further management.     Labs  Reviewed  CBC  DIFFERENTIAL  COMPREHENSIVE METABOLIC PANEL  LIPASE, BLOOD  URINALYSIS, ROUTINE W REFLEX MICROSCOPIC  LACTIC ACID, PLASMA  PROTIME-INR  APTT     No results found.   No diagnosis found.    MDM  Gastritis, peptic ulcer disease, biliary colic, cholelithiasis, cholecystitis, cholangitis, hepatitis, renal colic, urinary tract infection, colitis, constipation, gastroenteritis all considered among other etiologies in the patient's differential diagnosis.  I've reviewed the patient's recent CT scan of the abdomen which was borderline for whether there may be some mild colitis present, but there wasn't definitive colitis or diverticulitis. However with the recent diagnosis of colitis and worsening abdominal pain, considerations would include worsening colitis with pericolonic abscess formation or perforation. The patient does not appear septic or with systematic extension of infection, and is taking antibiotics. I will get lab work up to evaluate for mesenteric ischemia, the aforementioned diagnoses, and an acute abdominal series to exclude the presence of free air under the diaphragm.    I personally performed the services described in this documentation, which was scribed in my presence. The recorded information has been reviewed and considered.      Felisa Bonier, MD 11/15/11 2018  Felisa Bonier, MD 11/15/11 [phone]

## 2011-11-19 NOTE — Progress Notes (Signed)
Results Cc to PCP  

## 2011-11-24 ENCOUNTER — Encounter (HOSPITAL_COMMUNITY): Payer: Self-pay | Admitting: *Deleted

## 2011-11-24 ENCOUNTER — Inpatient Hospital Stay (HOSPITAL_COMMUNITY)
Admission: EM | Admit: 2011-11-24 | Discharge: 2011-11-28 | DRG: 378 | Disposition: A | Discharge status: Home / Self Care | Payer: Medicaid Other | Attending: Critical Care Medicine | Admitting: Critical Care Medicine

## 2011-11-24 DIAGNOSIS — D62 Acute posthemorrhagic anemia: Secondary | ICD-10-CM | POA: Diagnosis present

## 2011-11-24 DIAGNOSIS — C679 Malignant neoplasm of bladder, unspecified: Secondary | ICD-10-CM

## 2011-11-24 DIAGNOSIS — I959 Hypotension, unspecified: Secondary | ICD-10-CM

## 2011-11-24 DIAGNOSIS — I8500 Esophageal varices without bleeding: Secondary | ICD-10-CM | POA: Diagnosis present

## 2011-11-24 DIAGNOSIS — G2581 Restless legs syndrome: Secondary | ICD-10-CM

## 2011-11-24 DIAGNOSIS — E278 Other specified disorders of adrenal gland: Secondary | ICD-10-CM

## 2011-11-24 DIAGNOSIS — M129 Arthropathy, unspecified: Secondary | ICD-10-CM

## 2011-11-24 DIAGNOSIS — R5381 Other malaise: Secondary | ICD-10-CM

## 2011-11-24 DIAGNOSIS — F1011 Alcohol abuse, in remission: Secondary | ICD-10-CM

## 2011-11-24 DIAGNOSIS — K7460 Unspecified cirrhosis of liver: Secondary | ICD-10-CM

## 2011-11-24 DIAGNOSIS — R571 Hypovolemic shock: Secondary | ICD-10-CM

## 2011-11-24 DIAGNOSIS — R5383 Other fatigue: Secondary | ICD-10-CM

## 2011-11-24 DIAGNOSIS — R635 Abnormal weight gain: Secondary | ICD-10-CM

## 2011-11-24 DIAGNOSIS — F329 Major depressive disorder, single episode, unspecified: Secondary | ICD-10-CM

## 2011-11-24 DIAGNOSIS — K219 Gastro-esophageal reflux disease without esophagitis: Secondary | ICD-10-CM

## 2011-11-24 DIAGNOSIS — I10 Essential (primary) hypertension: Secondary | ICD-10-CM

## 2011-11-24 DIAGNOSIS — F3289 Other specified depressive episodes: Secondary | ICD-10-CM

## 2011-11-24 DIAGNOSIS — K7689 Other specified diseases of liver: Secondary | ICD-10-CM

## 2011-11-24 DIAGNOSIS — E118 Type 2 diabetes mellitus with unspecified complications: Secondary | ICD-10-CM

## 2011-11-24 DIAGNOSIS — R809 Proteinuria, unspecified: Secondary | ICD-10-CM

## 2011-11-24 DIAGNOSIS — G4733 Obstructive sleep apnea (adult) (pediatric): Secondary | ICD-10-CM

## 2011-11-24 DIAGNOSIS — F17200 Nicotine dependence, unspecified, uncomplicated: Secondary | ICD-10-CM

## 2011-11-24 DIAGNOSIS — G609 Hereditary and idiopathic neuropathy, unspecified: Secondary | ICD-10-CM | POA: Diagnosis present

## 2011-11-24 DIAGNOSIS — I8501 Esophageal varices with bleeding: Secondary | ICD-10-CM

## 2011-11-24 DIAGNOSIS — D509 Iron deficiency anemia, unspecified: Secondary | ICD-10-CM

## 2011-11-24 DIAGNOSIS — E119 Type 2 diabetes mellitus without complications: Secondary | ICD-10-CM | POA: Diagnosis present

## 2011-11-24 DIAGNOSIS — D500 Iron deficiency anemia secondary to blood loss (chronic): Secondary | ICD-10-CM | POA: Insufficient documentation

## 2011-11-24 DIAGNOSIS — R9431 Abnormal electrocardiogram [ECG] [EKG]: Secondary | ICD-10-CM

## 2011-11-24 DIAGNOSIS — D518 Other vitamin B12 deficiency anemias: Secondary | ICD-10-CM

## 2011-11-24 DIAGNOSIS — K921 Melena: Secondary | ICD-10-CM

## 2011-11-24 DIAGNOSIS — K922 Gastrointestinal hemorrhage, unspecified: Principal | ICD-10-CM

## 2011-11-24 DIAGNOSIS — K766 Portal hypertension: Secondary | ICD-10-CM | POA: Diagnosis present

## 2011-11-24 DIAGNOSIS — D6489 Other specified anemias: Secondary | ICD-10-CM

## 2011-11-24 DIAGNOSIS — G4700 Insomnia, unspecified: Secondary | ICD-10-CM

## 2011-11-24 DIAGNOSIS — M25519 Pain in unspecified shoulder: Secondary | ICD-10-CM

## 2011-11-24 DIAGNOSIS — E785 Hyperlipidemia, unspecified: Secondary | ICD-10-CM

## 2011-11-24 DIAGNOSIS — R0789 Other chest pain: Secondary | ICD-10-CM

## 2011-11-24 DIAGNOSIS — K644 Residual hemorrhoidal skin tags: Secondary | ICD-10-CM

## 2011-11-24 DIAGNOSIS — D6959 Other secondary thrombocytopenia: Secondary | ICD-10-CM

## 2011-11-24 HISTORY — DX: Esophageal varices with bleeding: I85.01

## 2011-11-24 MED ORDER — FAMOTIDINE IN NACL 20-0.9 MG/50ML-% IV SOLN
20.0000 mg | Freq: Once | INTRAVENOUS | Status: AC
  Administered 2011-11-24: 20 mg via INTRAVENOUS
  Filled 2011-11-24: qty 50

## 2011-11-24 MED ORDER — SODIUM CHLORIDE 0.9 % IV BOLUS (SEPSIS)
1000.0000 mL | Freq: Once | INTRAVENOUS | Status: AC
  Administered 2011-11-25: 1000 mL via INTRAVENOUS

## 2011-11-24 MED ORDER — PANTOPRAZOLE SODIUM 40 MG IV SOLR
40.0000 mg | Freq: Once | INTRAVENOUS | Status: AC
  Administered 2011-11-24: 40 mg via INTRAVENOUS
  Filled 2011-11-24: qty 40

## 2011-11-24 NOTE — ED Notes (Signed)
C/o vomiting blood, started 2 hours ago, recent colon infection per pt

## 2011-11-24 NOTE — ED Notes (Signed)
Pt brought to er by ems, stated he was resting in bed, and began vomiting, x2 bloody,  Recently dx w "infection in my gut" and on antibiotics and pain meds.  Had been at family members house earlier this evening and had been drinking etoh.  Pt is diabetic--cbg in er 345.  Iv in placed at arrival and bolus infusing.  bp remains decreased.  Pt sob at times, 02 placed at 2l [place].  Family at bedside.

## 2011-11-24 NOTE — ED Notes (Signed)
nt reported that pt was up to bathroom and had large bright red liquid bm, bp increased.  Family at bedside.  Idol pa to see pt

## 2011-11-25 ENCOUNTER — Encounter (HOSPITAL_COMMUNITY): Payer: Self-pay | Admitting: Internal Medicine

## 2011-11-25 ENCOUNTER — Inpatient Hospital Stay (HOSPITAL_COMMUNITY): Payer: Medicaid Other

## 2011-11-25 ENCOUNTER — Encounter (HOSPITAL_COMMUNITY)
Admission: EM | Disposition: A | Discharge status: Home / Self Care | Payer: Self-pay | Source: Home / Self Care | Attending: Critical Care Medicine

## 2011-11-25 DIAGNOSIS — E119 Type 2 diabetes mellitus without complications: Secondary | ICD-10-CM

## 2011-11-25 DIAGNOSIS — I959 Hypotension, unspecified: Secondary | ICD-10-CM

## 2011-11-25 DIAGNOSIS — K922 Gastrointestinal hemorrhage, unspecified: Secondary | ICD-10-CM

## 2011-11-25 DIAGNOSIS — K7030 Alcoholic cirrhosis of liver without ascites: Secondary | ICD-10-CM

## 2011-11-25 DIAGNOSIS — K920 Hematemesis: Secondary | ICD-10-CM

## 2011-11-25 HISTORY — PX: ESOPHAGOGASTRODUODENOSCOPY: SHX5428

## 2011-11-25 LAB — COMPREHENSIVE METABOLIC PANEL
Alkaline Phosphatase: 64 U/L (ref 39–117)
BUN: 29 mg/dL — ABNORMAL HIGH (ref 6–23)
CO2: 23 mEq/L (ref 19–32)
Chloride: 103 mEq/L (ref 96–112)
GFR calc Af Amer: 75 mL/min — ABNORMAL LOW (ref >90)
GFR calc non Af Amer: 64 mL/min — ABNORMAL LOW (ref >90)
Glucose, Bld: 359 mg/dL — ABNORMAL HIGH (ref 70–99)
Potassium: 5.4 mEq/L — ABNORMAL HIGH (ref 3.5–5.1)
Total Bilirubin: 0.5 mg/dL (ref 0.3–1.2)

## 2011-11-25 LAB — PROTIME-INR
INR: 1.44 (ref 0.00–1.49)
INR: 1.51 — ABNORMAL HIGH (ref 0.00–1.49)
Prothrombin Time: 17.8 seconds — ABNORMAL HIGH (ref 11.6–15.2)
Prothrombin Time: 18.5 seconds — ABNORMAL HIGH (ref 11.6–15.2)

## 2011-11-25 LAB — CBC
MCHC: 33.3 g/dL (ref 30.0–36.0)
Platelets: 124 10*3/uL — ABNORMAL LOW (ref 150–400)
RDW: 14.1 % (ref 11.5–15.5)
WBC: 10 10*3/uL (ref 4.0–10.5)

## 2011-11-25 LAB — GLUCOSE, CAPILLARY: Glucose-Capillary: 299 mg/dL — ABNORMAL HIGH (ref 70–99)

## 2011-11-25 LAB — CARDIAC PANEL(CRET KIN+CKTOT+MB+TROPI)
CK, MB: 4.2 ng/mL — ABNORMAL HIGH (ref 0.3–4.0)
Total CK: 196 U/L (ref 7–232)

## 2011-11-25 LAB — BASIC METABOLIC PANEL
Chloride: 107 mEq/L (ref 96–112)
GFR calc Af Amer: >90 mL/min (ref >90)
Potassium: 4.6 mEq/L (ref 3.5–5.1)
Sodium: 137 mEq/L (ref 135–145)

## 2011-11-25 LAB — DIFFERENTIAL
Basophils Absolute: 0 10*3/uL (ref 0.0–0.1)
Basophils Relative: 0 % (ref 0–1)
Eosinophils Relative: 1 % (ref 0–5)
Lymphocytes Relative: 21 % (ref 12–46)
Neutro Abs: 6.8 10*3/uL (ref 1.7–7.7)

## 2011-11-25 LAB — HEMOGLOBIN AND HEMATOCRIT, BLOOD
HCT: 28.8 % — ABNORMAL LOW (ref 39.0–52.0)
HCT: 28.7 % — ABNORMAL LOW (ref 39.0–52.0)
Hemoglobin: 9.6 g/dL — ABNORMAL LOW (ref 13.0–17.0)

## 2011-11-25 LAB — PREPARE RBC (CROSSMATCH)

## 2011-11-25 LAB — SAMPLE TO BLOOD BANK

## 2011-11-25 SURGERY — EGD (ESOPHAGOGASTRODUODENOSCOPY)
Anesthesia: Moderate Sedation | Laterality: Left

## 2011-11-25 MED ORDER — MIDAZOLAM HCL 10 MG/2ML IJ SOLN
INTRAMUSCULAR | Status: DC | PRN
  Administered 2011-11-25: 2 mg via INTRAVENOUS
  Administered 2011-11-25: 1 mg via INTRAVENOUS
  Administered 2011-11-25: 2 mg via INTRAVENOUS

## 2011-11-25 MED ORDER — MORPHINE SULFATE 2 MG/ML IJ SOLN
2.0000 mg | Freq: Once | INTRAMUSCULAR | Status: AC
  Administered 2011-11-25: 2 mg via INTRAVENOUS
  Filled 2011-11-25: qty 1

## 2011-11-25 MED ORDER — CHLORHEXIDINE GLUCONATE 0.12 % MT SOLN
15.0000 mL | Freq: Two times a day (BID) | OROMUCOSAL | Status: DC
  Administered 2011-11-25 – 2011-11-26 (×3): 15 mL via OROMUCOSAL
  Filled 2011-11-25 (×4): qty 15

## 2011-11-25 MED ORDER — FENTANYL CITRATE 0.05 MG/ML IJ SOLN
INTRAMUSCULAR | Status: DC | PRN
  Administered 2011-11-25 (×2): 25 ug via INTRAVENOUS

## 2011-11-25 MED ORDER — FENTANYL CITRATE 0.05 MG/ML IJ SOLN
INTRAMUSCULAR | Status: AC
  Filled 2011-11-25: qty 2

## 2011-11-25 MED ORDER — METOCLOPRAMIDE HCL 5 MG/ML IJ SOLN
10.0000 mg | Freq: Once | INTRAMUSCULAR | Status: AC
  Administered 2011-11-25: 10 mg via INTRAVENOUS
  Filled 2011-11-25 (×2): qty 2

## 2011-11-25 MED ORDER — MIDAZOLAM HCL 10 MG/2ML IJ SOLN
INTRAMUSCULAR | Status: AC
  Filled 2011-11-25: qty 2

## 2011-11-25 MED ORDER — SODIUM CHLORIDE 0.9 % IV SOLN
8.0000 mg/h | INTRAVENOUS | Status: DC
  Administered 2011-11-25 – 2011-11-26 (×3): 8 mg/h via INTRAVENOUS
  Filled 2011-11-25 (×6): qty 80

## 2011-11-25 MED ORDER — SODIUM CHLORIDE 0.9 % IV SOLN
50.0000 ug/h | INTRAVENOUS | Status: DC
  Administered 2011-11-25 – 2011-11-26 (×4): 50 ug/h via INTRAVENOUS
  Filled 2011-11-25 (×10): qty 1

## 2011-11-25 MED ORDER — SODIUM CHLORIDE 0.9 % IV SOLN
250.0000 mL | INTRAVENOUS | Status: DC | PRN
  Administered 2011-11-26: 10:00:00 via INTRAVENOUS

## 2011-11-25 MED ORDER — OCTREOTIDE ACETATE 100 MCG/ML IJ SOLN
INTRAMUSCULAR | Status: AC
  Filled 2011-11-25: qty 1

## 2011-11-25 MED ORDER — OCTREOTIDE ACETATE 500 MCG/ML IJ SOLN
INTRAMUSCULAR | Status: AC
  Filled 2011-11-25: qty 1

## 2011-11-25 MED ORDER — BIOTENE DRY MOUTH MT LIQD
15.0000 mL | Freq: Two times a day (BID) | OROMUCOSAL | Status: DC
  Administered 2011-11-25 – 2011-11-26 (×3): 15 mL via OROMUCOSAL

## 2011-11-25 MED ORDER — PNEUMOCOCCAL VAC POLYVALENT 25 MCG/0.5ML IJ INJ
0.5000 mL | INJECTION | INTRAMUSCULAR | Status: AC
  Administered 2011-11-26: 0.5 mL via INTRAMUSCULAR
  Filled 2011-11-25: qty 0.5

## 2011-11-25 MED ORDER — DEXTROSE 5 % IV SOLN
1.0000 g | INTRAVENOUS | Status: DC
  Administered 2011-11-25 – 2011-11-26 (×2): 1 g via INTRAVENOUS
  Filled 2011-11-25 (×2): qty 10

## 2011-11-25 MED ORDER — INSULIN ASPART 100 UNIT/ML ~~LOC~~ SOLN
0.0000 [IU] | Freq: Three times a day (TID) | SUBCUTANEOUS | Status: DC
  Administered 2011-11-25: 8 [IU] via SUBCUTANEOUS

## 2011-11-25 MED ORDER — INSULIN ASPART 100 UNIT/ML ~~LOC~~ SOLN
0.0000 [IU] | Freq: Three times a day (TID) | SUBCUTANEOUS | Status: DC
Start: 2011-11-25 — End: 2011-11-25
  Filled 2011-11-25: qty 3

## 2011-11-25 MED ORDER — INSULIN ASPART 100 UNIT/ML ~~LOC~~ SOLN
0.0000 [IU] | SUBCUTANEOUS | Status: DC
  Administered 2011-11-25: 3 [IU] via SUBCUTANEOUS
  Administered 2011-11-25: 5 [IU] via SUBCUTANEOUS
  Administered 2011-11-25: 8 [IU] via SUBCUTANEOUS
  Administered 2011-11-25: 3 [IU] via SUBCUTANEOUS
  Administered 2011-11-26: 8 [IU] via SUBCUTANEOUS
  Administered 2011-11-26: 2 [IU] via SUBCUTANEOUS
  Administered 2011-11-26: 3 [IU] via SUBCUTANEOUS
  Administered 2011-11-26 (×3): 5 [IU] via SUBCUTANEOUS
  Administered 2011-11-27: 15 [IU] via SUBCUTANEOUS
  Administered 2011-11-27: 11 [IU] via SUBCUTANEOUS
  Administered 2011-11-27: 5 [IU] via SUBCUTANEOUS
  Administered 2011-11-27: 2 [IU] via SUBCUTANEOUS
  Administered 2011-11-27: 5 [IU] via SUBCUTANEOUS
  Administered 2011-11-28: 3 [IU] via SUBCUTANEOUS

## 2011-11-25 MED ORDER — INSULIN ASPART 100 UNIT/ML ~~LOC~~ SOLN: 0.0000 [IU] | Freq: Three times a day (TID) | SUBCUTANEOUS | Status: DC

## 2011-11-25 MED ORDER — SODIUM CHLORIDE 0.9 % IV SOLN
INTRAVENOUS | Status: AC
  Administered 2011-11-25 (×2): via INTRAVENOUS

## 2011-11-25 MED ORDER — FENTANYL CITRATE 0.05 MG/ML IJ SOLN: 25.0000 ug | INTRAMUSCULAR | Status: DC | PRN

## 2011-11-25 MED ORDER — GABAPENTIN 600 MG PO TABS
600.0000 mg | ORAL_TABLET | Freq: Three times a day (TID) | ORAL | Status: DC
  Administered 2011-11-25 – 2011-11-28 (×9): 600 mg via ORAL
  Filled 2011-11-25 (×11): qty 1

## 2011-11-25 MED ORDER — OCTREOTIDE ACETATE 50 MCG/ML IJ SOLN
50.0000 ug | Freq: Once | INTRAMUSCULAR | Status: AC
  Administered 2011-11-25: 50 ug via INTRAVENOUS
  Filled 2011-11-25: qty 1

## 2011-11-25 NOTE — Procedures (Signed)
Central Venous Catheter Insertion Procedure Note RED MANDT [id number] 08-Jan-1956  Procedure: Insertion of Central Venous Catheter Indications: Assessment of intravascular volume  Procedure Details Consent: Risks of procedure as well as the alternatives and risks of each were explained to the (patient/caregiver).  Consent for procedure obtained. Time Out: Verified patient identification, verified procedure, site/side was marked, verified correct patient position, special equipment/implants available, medications/allergies/relevent history reviewed, required imaging and test results available.  Performed  Maximum sterile technique was used including antiseptics, cap, gloves, gown, hand hygiene, mask and sheet. Skin prep: Chlorhexidine; local anesthetic administered A antimicrobial bonded/coated triple lumen catheter was placed in the right internal jugular vein using the Seldinger technique. Ultrasound was used for vessel identification and guidance.  Evaluation Blood flow good Complications: No apparent complications Patient did tolerate procedure well. Chest X-ray ordered to verify placement.  CXR: pending.  Monnica Saltsman 11/25/2011, 5:03 AM

## 2011-11-25 NOTE — ED Notes (Signed)
Report to robert at [facility], [facility] to transport pt.

## 2011-11-25 NOTE — ED Provider Notes (Addendum)
Patient is a 56 year old male with a history of cirrhosis of the liver who presents with acute onset of upper GI bleeding. He states that at home this evening he developed bright red blood which she was actively vomiting, large amount x2. He then had several bowel movements of dark red blood.  Systems are intermittent, gradually getting worse and associated with lightheadedness. At this time the symptoms are severe not take any blood thinners but was recently treated for colitis with Cipro, Flagyl, Percocet.  Review of systems, other than history of present illness, all 10 systems reviewed and negative  Physical exam:  Patient is hypotensive, pale, borderline tachycardic with pulse of 105.   Abdomen is soft, increased bowel sounds, no tenderness. Cardiac exam with tachycardia, lungs are clear bilaterally, mental status is normal. There is no blood in the oropharynx, but patient has been chewing on ice. Bedside commode filled with melanotic stool. Mild edema to the bilateral lower extremitys  Assessment: active GI bleed, likely upper, per CT scan from this month likely related to esophageal varices. Patient has had 2 large-bore IVs ordered, IV fluids started, type and cross was preparing to transfuse, octreotide with drip, protonix with gtt, GI consultation emergently.  At this time there is no beds in this hospital available, will talk to GI at outside hospital for transfer.  Have d/w critical care Dr. Cliffton Asters as well as GI on call Dr. Rhea Belton - to go to ICU and to be scoped by GI.  Pt remains with mild hypotension.    CRITICAL CARE Performed by: Vida Roller   Total critical care time: 35  Critical care time was exclusive of separately billable procedures and treating other patients.  Critical care was necessary to treat or prevent imminent or life-threatening deterioration.  Critical care was time spent personally by me on the following activities: development of treatment plan with patient  and/or surrogate as well as nursing, discussions with consultants, evaluation of patient's response to treatment, examination of patient, obtaining history from patient or surrogate, ordering and performing treatments and interventions, ordering and review of laboratory studies, ordering and review of radiographic studies, pulse oximetry and re-evaluation of patient's condition.   Medical screening examination/treatment/procedure(s) were conducted as a shared visit with non-physician practitioner(s) and myself.  I personally evaluated the patient during the encounter  Vida Roller, MD 11/25/11 0530  Vida Roller, MD 12/05/11 [phone]

## 2011-11-25 NOTE — Brief Op Note (Signed)
See note in procedure tap. Esophageal varices s/p band ligation x 5 Mild portal hypertensive gastropathy  Bleeding felt related to variceal hemorrhage.

## 2011-11-25 NOTE — ED Notes (Signed)
Pt resting in bed, is pain free, bp 84/48, bolus infusing.   Pt was given ice chips by pa idol.

## 2011-11-25 NOTE — Consult Note (Signed)
[facility] Consultation  Referring Provider: No ref. provider found Primary Care Physician:  Redmond Baseman, MD, MD Primary Gastroenterologist:  Dr. Etheleen Mayhew, [place])  Reason for Consultation:  Hematemesis, cirrhosis with portal HTN  HPI: Kyle [name] is a 56 y.o. male with history of likely Kyle [name] cirrhosis possibly complicated by alcohol with portal hypertension and history of esophageal varices and thrombocytopenia, bladder cancer, hypertension, hyperlipidemia, GERD who presented to [facility] after 2 episodes of hematemesis at home. The patient reports he was at his brother's home yesterday afternoon when he developed a feeling of weakness, abdominal pressure followed by nausea. He returned home and had an episode of bright red hematemesis followed by maroon stools. Initially he thought this may have been bleeding from his sinuses, and therefore he continue to rest at home but then developed a second episode of bright red hematemesis and made the decision to report to the ER. He was evaluated there and noted to be transiently hypotensive and decision was made to transfer to our facility for further care/management. Since arriving here he's had no further episodes of hematemesis but is passing melenic stools. He has not required vasopressors, and is receiving octreotide and pantoprazole infusions, and currently getting his first unit of packed red cells, with plans for a second.  The patient reports prior to hematemesis yesterday feeling in his usual state of health. He was having alcohol yesterday, but notes he rarely does this. He reports drinking 2 alcoholic drinks yesterday.  Currently he denies pain other than issues with his restless leg. He denies nausea. No abdominal pain. No fever. No chest pain or shortness of breath.  He reports he has had prior endoscopy and was told he had varices, but has never had variceal hemorrhage. He notes his brother has similar  problems and has had variceal banding in the past.  Past Medical History  Diagnosis Date  . DM (diabetes mellitus)   . Cirrhosis     bx proven steatohepatitis with cirrhosis (2010); per note from Feb 2011, received Hep A and B vaccines in 2010  . HTN (hypertension)   . RLS (restless legs syndrome)   . Sleep apnea   . Hyperlipidemia   . IDA (iron deficiency anemia)   . GERD (gastroesophageal reflux disease)   . Depression   . Peripheral neuropathy   . Urothelial cancer     2010, paillary low-grade, h/o recurrence 2011  . B12 deficiency   . Psoriasis   . 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 Anemia due to multiple mechanisms 05/13/2011  . History of alcohol abuse 05/13/2011  . ANEMIA-IRON DEFICIENCY 03/09/2009  . S/P endoscopy May 2012    4 columns grade II esophageal varices; due for repeat in Nov 2013   . S/P colonoscopy May 2012    Tubular adenoma  . Low back pain   . Cancer     bladder ca 05/2009 removal and  w/chemo wash    Past Surgical History  Procedure Date  . Carpel tunnel   . Shoulder surgery   . Adrenal mass surgery 05/2009    benign, left  . Bladder surgery 01/2009 and 06/2010    cancer 2010, small recurrence in 06/2010  . Colonoscopy 04/2009    moderate int hemorrhoids, rare sigmoid diverticula, one mm sessile hyperplastic rectal polyp  . Esophagogastroduodenoscopy 03/2009    small hh  . Small bowel capsule endoscopy 03/2009    couple of small benign appearing erosions, nonbleeding  . Egd/tcs  08/2007    small hiatal hernia, pancolonic diverticula, friable anal canal, 3cm salmon colored epithelium in distal esophagus, bx negative for Barrett's  . Skin cancer excision Oct 2012    left arm    Prior to Admission medications   Medication Sig Start Date End Date Taking? Authorizing Provider  aspirin EC 81 MG tablet Take 81 mg by mouth daily.     Yes Historical Provider, MD  atenolol (TENORMIN) 100 MG tablet Take 100 mg by mouth daily.     Yes Historical  Provider, MD  fish oil-omega-3 fatty acids 1000 MG capsule Take 1 g by mouth 3 (three) times daily.    Yes Historical Provider, MD  furosemide (LASIX) 20 MG tablet Take 20 mg by mouth daily.    Yes Historical Provider, MD  gabapentin (NEURONTIN) 300 MG capsule Take 1,200 mg by mouth 2 (two) times daily.    Yes Historical Provider, MD  insulin aspart (NOVOLOG) 100 UNIT/ML injection Inject 20 Units into the skin 2 (two) times daily. Sliding Scale   Yes Historical Provider, MD  insulin glargine (LANTUS) 100 UNIT/ML injection Inject 100 Units into the skin at bedtime.    Yes Historical Provider, MD  lisinopril (PRINIVIL,ZESTRIL) 20 MG tablet Take 10 mg by mouth daily.    Yes Historical Provider, MD  magnesium oxide (MAG-OX) 400 MG tablet Take 400 mg by mouth daily.     Yes Historical Provider, MD  metFORMIN (GLUCOPHAGE) 1000 MG tablet Take 1,000 mg by mouth 2 (two) times daily with a meal.     Yes Historical Provider, MD  metroNIDAZOLE (FLAGYL) 500 MG tablet Take 1 tablet (500 mg total) by mouth 3 (three) times daily. For 7 days 11/14/11 11/28/11 Yes Gerrit Halls, NP  Multiple Vitamin (MULTIVITAMIN) capsule Take 1 capsule by mouth daily.     Yes Historical Provider, MD  omeprazole (PRILOSEC) 40 MG capsule Take 40 mg by mouth daily.     Yes Historical Provider, MD  oxyCODONE (OXY IR/ROXICODONE) 5 MG immediate release tablet Take 2 tablets (10 mg total) by mouth every 4 (four) hours as needed for pain. 11/15/11 11/25/11 Yes Felisa Bonier, MD  pramipexole (MIRAPEX) 0.5 MG tablet Take 0.5 mg by mouth at bedtime.    Yes Historical Provider, MD    Current Facility-Administered Medications  Medication Dose Route Frequency Provider Last Rate Last Dose  . 0.9 %  sodium chloride infusion  250 mL Intravenous PRN Max Fickle, MD      . 0.9 %  sodium chloride infusion   Intravenous Continuous Max Fickle, MD 125 mL/hr at 11/25/11 0515    . cefTRIAXone (ROCEPHIN) 1 g in dextrose 5 % 50 mL IVPB  1 g Intravenous  Q24H Max Fickle, MD   1 g at 11/25/11 0615  . famotidine (PEPCID) IVPB 20 mg  20 mg Intravenous Once Candis Musa, PA 100 mL/hr at 11/24/11 2359 20 mg at 11/24/11 2359  . insulin aspart (novoLOG) injection 0-15 Units  0-15 Units Subcutaneous Q4H Mcarthur Rossetti. Feinstein   5 Units at 11/25/11 0831  . octreotide (SANDOSTATIN) 2 mcg/mL in sodium chloride 0.9 % 250 mL infusion  50 mcg/hr Intravenous Continuous Jola Baptist Idol, PA 25 mL/hr at 11/25/11 0400 50 mcg/hr at 11/25/11 0400  . octreotide (SANDOSTATIN) injection 50 mcg  50 mcg Intravenous Once Candis Musa, PA   50 mcg at 11/25/11 0106  . pantoprazole (PROTONIX) 80 mg in sodium chloride 0.9 % 250 mL infusion  8 mg/hr Intravenous  Continuous Max Fickle, MD 25 mL/hr at 11/25/11 0615 8 mg/hr at 11/25/11 0615  . pantoprazole (PROTONIX) injection 40 mg  40 mg Intravenous Once Candis Musa, PA   40 mg at 11/24/11 2359  . sodium chloride 0.9 % bolus 1,000 mL  1,000 mL Intravenous Once Candis Musa, PA   1,000 mL at 11/25/11 0011  . DISCONTD: insulin aspart (novoLOG) injection 0-15 Units  0-15 Units Subcutaneous TID WC Max Fickle, MD      . DISCONTD: insulin aspart (novoLOG) injection 0-15 Units  0-15 Units Subcutaneous TID WC Max Fickle, MD      . DISCONTD: insulin aspart (novoLOG) injection 0-15 Units  0-15 Units Subcutaneous TID WC Max Fickle, MD   8 Units at 11/25/11 0606    Allergies as of 11/24/2011 - Review Complete 11/24/2011  Allergen Reaction Noted  . Tylenol (acetaminophen) Other (See Comments) 07/17/2011    Family History  Problem Relation Age of Onset  . Cirrhosis Father     etoh  . Colon cancer Neg Hx   . Kidney cancer Mother   . Cancer Mother   . HIV Brother   . Cirrhosis Brother     nash     Social History  . Marital Status: Married    Number of Children: 3   Occupational History  . disabled    Social History Main Topics  . Smoking status: Current Everyday Smoker -- 1.0 packs/day    Types: Cigarettes   . Smokeless tobacco: Not on file  . Alcohol Use: Rarely now     drank heavily for few years in 20s  . Drug Use: No    Review of Systems: Gen: Denies any fever, chills, sweats, anorexia, weight loss, and sleep disorder CV: Denies chest pain, angina, palpitations, syncope, orthopnea, PND, + for slight LE edema Resp: Denies dyspnea at rest, dyspnea with exercise, cough, GI: See HPI GU : Denies urinary burning, blood in urine MS: Denies joint pain, limitation of movement Derm: Denies rash, itching, dry skin, hives, moles, warts, or unhealing ulcers.  Psych: Denies depression, anxiety Heme: Denies bruising, bleeding, and enlarged lymph nodes. Neuro:  Denies any headaches, dizziness, paresthesias. Endo:  Denies any problems with DM, thyroid, hx of adrenal surgery  Physical Exam: Vital signs in last 24 hours: Temp:  [98.4 F (36.9 C)-99.9 F (37.7 C)] 98.4 F (36.9 C) (01/01 0806) Pulse Rate:  [86-106] 86  (01/01 0600) Resp:  [11-25] 25  (01/01 0600) BP: (84-119)/(48-81) 89/52 mmHg (01/01 0600) SpO2:  [96 %-99 %] 98 % (01/01 0600) FiO2 (%):  [28 %] 28 % (01/01 0502) Weight:  [240 lb (108.863 kg)] 240 lb (108.863 kg) (01/01 0400) Last BM Date: 11/24/11 General:   Alert,  Well-developed, well-nourished, pleasant and cooperative in NAD Head:  Normocephalic and atraumatic. Eyes:  Sclera clear, no icterus.   Conjunctiva pink. Nose:  No deformity, discharge,  or lesions. Mouth:  No deformity or lesions.  Oropharynx pink & moist. Neck:  Supple; no masses or thyromegaly. Lungs:  Clear throughout to auscultation.   No wheezes, crackles, or rhonchi. No acute distress. Heart:  Regular rate and rhythm; no murmurs, clicks, rubs,  or gallops. Abdomen:  Soft, nontender and nondistended. No masses, ? Of ascites, ventral scar noted and well-healed, normal BS   Msk:  Symmetrical without gross deformities. Normal posture. Pulses:  Normal pulses noted. Extremities:  Without clubbing, trace pretib  edema Neurologic:  Alert and  oriented x4;  grossly normal neurologically, no  asterixis  Intake/Output from previous day: 12/31 0701 - 01/01 0700 In: 3025 [I.V.:3025] Out: 500 [Urine:500] Intake/Output this shift: Total I/O In: 600 [I.V.:250; Blood:350] Out: -   Lab Results:  [facility] 11/24/11 2340  WBC 10.0  HGB 11.4*  HCT 34.2*  PLT 124*   BMET  Basename 11/24/11 2340  NA 136  K 5.4*  CL 103  CO2 23  GLUCOSE 359*  BUN 29*  CREATININE 1.24  CALCIUM 9.3   LFT  Basename 11/24/11 2340  PROT 6.7  ALBUMIN 2.8*  AST 38*  ALT 37  ALKPHOS 64  BILITOT 0.5  BILIDIR --  IBILI --   PT/INR  Basename 11/24/11 2340  LABPROT 18.5*  INR 1.51*   Studies/Results: Dg Chest Port 1 View  11/25/2011  *RADIOLOGY REPORT*  Clinical Data: The central line placement  PORTABLE CHEST - 1 VIEW  Comparison: 07/02/2010  Findings: Interval placement of right central venous catheter with tip in the lower SVC region.  No evidence of pneumothorax.  Shallow inspiration.  Mild prominence of heart size and pulmonary vascularity which might reflect early congestive change or vascular crowding from the shallow inspiration.  No blunting of costophrenic angles.  No focal airspace consolidation.  IMPRESSION: Right central venous catheter with tip over the lower SVC region. No pneumothorax.  Shallow inspiration.  Original Report Authenticated By: Marlon Pel, M.D.     Impression / Plan:  56 yo with history of likely Kyle [name] cirrhosis possibly complicated by alcohol with portal hypertension and history of esophageal varices and thrombocytopenia, bladder cancer, hypertension, hyperlipidemia, GERD who presented to [facility] after 2 episodes of hematemesis at home.  1. UGI bleed in setting of cirrhosis with portal HTN -- patient has a known history of esophageal varices due to cirrhosis with portal hypertension and given his presentation concern for variceal hemorrhage. He is appropriately on  octreotide and pantoprazole infusions. He has remained hemodynamic stable, and has not needed vasopressors to this point. He is getting blood transfusion at this moment. I feel he needs urgent upper endoscopy for evaluation of bleeding and possible variceal ligation. I discussed this procedure with him at length and he is agreeable to proceed. This will be done in the ICU. -He is already on antibiotics in the setting of upper GI bleed with cirrhosis, this is very appropriate he should complete 5 days -Strict alcohol avoidance -We discussed how if banding is performed today, and he will need subsequent endoscopy in 2-3 weeks to ensure eradication of varices. -Maintain large bore IV access -Cycle HCTs, follow INR -will need to consider BBlocker at discharge  Further recommendations after EGD  LOS: 1 day   Heran Campau M  11/25/2011, 8:35 AM

## 2011-11-25 NOTE — H&P (Signed)
Name: Kyle [name] MRN: [id number] DOB: November 10, 1956  LOS: 1  CRITICAL CARE ADMISSION NOTE  History of Present Illness: 56 y/o male with cirrhosis from steatohepatitis admitted from [facility] after one day of hematemesis and bright red blood per rectum.  He is known to have esophageal varices from prior EGD's (no prior episodes of bleeding).  He was at his brother's yesterday when he became suddenly nauseated with some cramping abdominal pain.  He then had two episodes of bright red emesis followed by hematochezia.  He went to the [facility] ED where he had another episode of both around 2100 on 11/24/11.  He was transiently hypotensive.  He was sent to our facility for further management.  Lines / Drains: 11/25/11 R IJ CVL >>  Cultures / Sepsis markers:   Antibiotics: 11/25/11 ceftriaxone >>  Tests / Events:      Past Medical History  Diagnosis Date  . DM (diabetes mellitus)   . Cirrhosis     bx proven steatohepatitis with cirrhosis (2010); per note from Feb 2011, received Hep A and B vaccines in 2010  . HTN (hypertension)   . RLS (restless legs syndrome)   . Sleep apnea   . Hyperlipidemia   . IDA (iron deficiency anemia)   . GERD (gastroesophageal reflux disease)   . Depression   . Peripheral neuropathy   . Urothelial cancer     2010, paillary low-grade, h/o recurrence 2011  . B12 deficiency   . Psoriasis   . 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 Anemia due to multiple mechanisms 05/13/2011  . History of alcohol abuse 05/13/2011  . ANEMIA-IRON DEFICIENCY 03/09/2009  . S/P endoscopy May 2012    4 columns grade II esophageal varices; due for repeat in Nov 2013   . S/P colonoscopy May 2012    Tubular adenoma  . Low back pain   . Cancer     bladder ca 05/2009 removal and  w/chemo wash   Past Surgical History  Procedure Date  . Carpel tunnel   . Shoulder surgery   . Adrenal mass surgery 05/2009    benign, left  . Bladder surgery 01/2009 and 06/2010    cancer 2010, small  recurrence in 06/2010  . Colonoscopy 04/2009    moderate int hemorrhoids, rare sigmoid diverticula, one mm sessile hyperplastic rectal polyp  . Esophagogastroduodenoscopy 03/2009    small hh  . Small bowel capsule endoscopy 03/2009    couple of small benign appearing erosions, nonbleeding  . Egd/tcs 08/2007    small hiatal hernia, pancolonic diverticula, friable anal canal, 3cm salmon colored epithelium in distal esophagus, bx negative for Barrett's  . Skin cancer excision Oct 2012    left arm   Prior to Admission medications   Medication Sig Start Date End Date Taking? Authorizing Provider  aspirin EC 81 MG tablet Take 81 mg by mouth daily.     Yes Historical Provider, MD  atenolol (TENORMIN) 100 MG tablet Take 100 mg by mouth daily.     Yes Historical Provider, MD  fish oil-omega-3 fatty acids 1000 MG capsule Take 1 g by mouth 3 (three) times daily.    Yes Historical Provider, MD  furosemide (LASIX) 20 MG tablet Take 20 mg by mouth daily.    Yes Historical Provider, MD  gabapentin (NEURONTIN) 300 MG capsule Take 1,200 mg by mouth 2 (two) times daily.    Yes Historical Provider, MD  insulin aspart (NOVOLOG) 100 UNIT/ML injection Inject 20 Units into the  skin 2 (two) times daily. Sliding Scale   Yes Historical Provider, MD  insulin glargine (LANTUS) 100 UNIT/ML injection Inject 100 Units into the skin at bedtime.    Yes Historical Provider, MD  lisinopril (PRINIVIL,ZESTRIL) 20 MG tablet Take 10 mg by mouth daily.    Yes Historical Provider, MD  magnesium oxide (MAG-OX) 400 MG tablet Take 400 mg by mouth daily.     Yes Historical Provider, MD  metFORMIN (GLUCOPHAGE) 1000 MG tablet Take 1,000 mg by mouth 2 (two) times daily with a meal.     Yes Historical Provider, MD  metroNIDAZOLE (FLAGYL) 500 MG tablet Take 1 tablet (500 mg total) by mouth 3 (three) times daily. For 7 days 11/14/11 11/28/11 Yes Gerrit Halls, NP  Multiple Vitamin (MULTIVITAMIN) capsule Take 1 capsule by mouth daily.     Yes  Historical Provider, MD  omeprazole (PRILOSEC) 40 MG capsule Take 40 mg by mouth daily.     Yes Historical Provider, MD  oxyCODONE (OXY IR/ROXICODONE) 5 MG immediate release tablet Take 2 tablets (10 mg total) by mouth every 4 (four) hours as needed for pain. 11/15/11 11/25/11 Yes Felisa Bonier, MD  pramipexole (MIRAPEX) 0.5 MG tablet Take 0.5 mg by mouth at bedtime.    Yes Historical Provider, MD   Allergies  Allergen Reactions  . Tylenol (Acetaminophen) Other (See Comments)    Causes legs to "run"   Family History  Problem Relation Age of Onset  . Cirrhosis Father     etoh  . Colon cancer Neg Hx   . Kidney cancer Mother   . Cancer Mother   . HIV Brother   . Cirrhosis Brother     nash   Social History  reports that he has been smoking Cigarettes.  He has been smoking about 1 pack per day. He does not have any smokeless tobacco history on file. He reports that he does not drink alcohol or use illicit drugs.  Review Of Systems   Gen: Denies fever, chills, weight change, fatigue, night sweats HEENT: Denies blurred vision, double vision, hearing loss, tinnitus, sinus congestion, rhinorrhea, sore throat, neck stiffness, dysphagia PULM: Denies shortness of breath, cough, sputum production, hemoptysis, wheezing CV: Denies chest pain, edema, orthopnea, paroxysmal nocturnal dyspnea, palpitations GI: per HPI GU: Denies dysuria, hematuria, polyuria, oliguria, urethral discharge Endocrine: Denies hot or cold intolerance, polyuria, polyphagia or appetite change Derm: Denies rash, dry skin, scaling or peeling skin change Heme: Denies easy bruising, bleeding, bleeding gums Neuro: Denies headache, numbness, weakness, slurred speech, loss of memory or consciousness  Vital Signs:   Filed Vitals:   11/25/11 0224 11/25/11 0308 11/25/11 0405 11/25/11 0500  BP: 94/64 107/53 119/81 99/56  Pulse: 98 89 96 92  Temp:  99.9 F (37.7 C) 98.5 F (36.9 C)   TempSrc:  Oral Oral   Resp: 18  11 17     Height:      Weight:      SpO2:  96% 98% 99%    Physical Examination: Gen:no acute distress HEENT: NCAT, PERRL, EOMi, OP clear, MMM, some dried blood on lips Neck: supple without masses PULM: CTA B CV: RRR, no mgr, no JVD AB: BS+, soft, nontender, no hsm Ext: warm, trace edema, no clubbing, no cyanosis Derm: no rash or skin breakdown Neuro: A&Ox4, CN II-XII intact, strength 5/5 in all 4 extremities Psyche: Normal mood and affect  Labs and Imaging:    CBC    Component Value Date/Time   WBC 10.0 11/24/2011 2340  RBC 3.56* 11/24/2011 2340   HGB 11.4* 11/24/2011 2340   HCT 34.2* 11/24/2011 2340   PLT 124* 11/24/2011 2340   MCV 96.1 11/24/2011 2340   MCH 32.0 11/24/2011 2340   MCHC 33.3 11/24/2011 2340   RDW 14.1 11/24/2011 2340   LYMPHSABS 2.1 11/24/2011 2340   MONOABS 1.1* 11/24/2011 2340   EOSABS 0.1 11/24/2011 2340   BASOSABS 0.0 11/24/2011 2340    BMET    Component Value Date/Time   NA 136 11/24/2011 2340   K 5.4* 11/24/2011 2340   CL 103 11/24/2011 2340   CO2 23 11/24/2011 2340   GLUCOSE 359* 11/24/2011 2340   BUN 29* 11/24/2011 2340   CREATININE 1.24 11/24/2011 2340   CREATININE 1.03 11/11/2011 1335   CALCIUM 9.3 11/24/2011 2340   GFRNONAA 64* 11/24/2011 2340   GFRAA 75* 11/24/2011 2340     Assessment and Plan:  56 y/o male with steatohepatitis related cirrhosis and known esophageal varices presents with an acute brisk upper GI bleed leading to transient hypotension.  Not currently vomiting or passing blood from below, but from description he likely lost a significant amount of blood (indicated by tachycardia/hypotension).  Upper GI bleed (11/25/2011)   Assessment: ddx includes esophageal varices vs. pud vs. Gastritis vs. Mallory weiss   Plan:  -ppt gtt -octreotide gtt -go consult now -serial hct -transfuse 2 u PRBC now  ANEMIA-IRON DEFICIENCY (03/09/2009)   Assessment: per GI Bleed   Plan: per GI bleed  HEPATIC CIRRHOSIS (05/21/2009)    Assessment: due to steatohepatitis, prior EtOH?   Plan:  -per GI -ceftriaxone given bleed, exam could be consistent with ascites -repeat INR later today  History of alcohol abuse (05/13/2011)   Assessment: says he is dry now   Plan:  -monitor for withdrawal  Hypotension (11/25/2011)   Assessment: due to gi bleed   Plan:  -cvl placed, await CXR results -IVF resuscitation  DM  Assessment:   Plan: -SSI + CBG  Best practices / Disposition: ICU status on PCCM service  Feeding/protein malnutrition: npo Analgesia: n/a Sedation: n/a Thromboprohylaxis: SCD HOB >30 degrees Ulcer prophylaxis: PPI gtt Glucose control/hyperglycemia: ssi + CBG  The patient is critically ill with multiple organ systems failure and requires high complexity decision making for assessment and support, frequent evaluation and titration of therapies, application of advanced monitoring technologies and extensive interpretation of multiple databases. Critical Care Time devoted to patient care services described in this note is 60 minutes.  [employer], M.D. Pulmonary and Critical Care Medicine [facility] Pager: [phone]  11/25/2011, 5:05 AM

## 2011-11-25 NOTE — Op Note (Signed)
Kyle [name] [facility] [address] [place], [place]  [id number]  ENDOSCOPY PROCEDURE REPORT  PATIENT:  Kyle, [name]  MR#:  [id number] BIRTHDATE:  09-29-1956, 55 yrs. old  GENDER:  male ENDOSCOPIST:  Carie Caddy. Eron Staat, MD Referred by:  Charlcie Cradle Delford Field, M.D. PROCEDURE DATE:  11/25/2011 PROCEDURE:  EGD with band ligation of varices ASA CLASS:  Class III INDICATIONS:  hematemesis, melena, cirrhosis MEDICATIONS:   Versed 5 mg IV, Fentanyl 50 mcg IV TOPICAL ANESTHETIC:  Cetacaine Spray  DESCRIPTION OF PROCEDURE:   After the risks benefits and alternatives of the procedure were thoroughly explained, informed consent was obtained.  The Pentax Gastroscope [id number] endoscope was introduced through the mouth and advanced to the second portion of the duodenum, without limitations.  The instrument was slowly withdrawn as the mucosa was fully examined. <<PROCEDUREIMAGES>>  Four columns of Grade III varices were found in the mid and distal esophagus.  In the distal esophagus, a red spot was found on a varix without active bleeding.  Banding of varices performed starting at the most distal varix in the esophagus.  5 bands placed successfully with decompression of remaining varices. Portal hypertensive gastropathy was found in the cardia, fundus and gastric body.  No definite evidence of gastric varices.  The distal stomach was unremarkable. The duodenal bulb was normal in appearance, as was the postbulbar duodenum.    Retroflexed views revealed findings as previously described.    The scope was then withdrawn from the patient and the procedure completed.  COMPLICATIONS:  None  ENDOSCOPIC IMPRESSION: 1) Grade III varices in the mid and distal esophagus, one with red mark without active hemorrhage.  5 bands placed successfully.  2) Portal gastropathy in the cardia, fundus, and gastric body. 3) Otherwise unremarkable stomach. 3) Normal examined duodenum  RECOMMENDATIONS: 1)  ICU monitoring, follow blood counts closely and transfuse as needed. 2) Continue octreotide infusion for another 24 hours. 3) Can discontinue PPI gtt, in favor of po BID PPI 4) Will need repeat EGD in 2-4 weeks for repeat banding to ensure eradication of varices. 5) Liquids only today after recovered from sedation.  No NG tube placement.  Carie Caddy. Ilse Billman, MD  CC:  Dr. Jena Gauss  n. eSIGNED:   Carie Caddy. Zamarion Longest at 11/25/2011 12:54 PM  Bethel Born, [phone]

## 2011-11-25 NOTE — ED Notes (Signed)
Was receiving report from [place], RN now [facility] personnel here to transport patient.

## 2011-11-25 NOTE — ED Notes (Signed)
Resting in bed, talking w. Wife, vss awaiting transport

## 2011-11-25 NOTE — ED Provider Notes (Signed)
History     CSN: [id number]  Arrival date & time 11/24/11  2201   First MD Initiated Contact with Patient 11/24/11 2218      Chief Complaint  Patient presents with  . Hematemesis    (Consider location/radiation/quality/duration/timing/severity/associated sxs/prior treatment) HPI Comments: Pateint presents for evaluation of hematemesis x 2 tonight,  Followed by one episode of bloody diarrhea.  The symptoms started around 8 pm.  He had a small amount of etoh at his brothers house prior to the event,  Started to have nausea,  So he and wife walked home prior to the beginning of vomiting.  He denies abdominal pain,  But has had increased mild cramping.  He does feel thirsty as his only real complaint time of presentation.  He states he recently had a "colon infection" for which he is taking flagyl.  He is followed by Dr. Jena Gauss for liver cirrhosis and Genella Rife.  He denies history of PUD.  Patient is a 56 y.o. male presenting with abdominal pain. The history is provided by the patient and the spouse.  Abdominal Pain The primary symptoms of the illness include abdominal pain, nausea, vomiting, diarrhea, hematemesis and hematochezia. The primary symptoms of the illness do not include fever or shortness of breath. The current episode started 1 to 2 hours ago. The onset of the illness was sudden. The problem has not changed since onset. The hematemesis is a new problem. The hematemesis has occurred 2 times. The hematemesis is associated with alcohol use. The hematemesis is not associated with heartburn, weakness or dizziness.  The hematochezia began today. The hematochezia is a new problem.  Symptoms associated with the illness do not include chills, diaphoresis or heartburn. Associated symptoms comments: He denies weakness and dizziness..    Past Medical History  Diagnosis Date  . DM (diabetes mellitus)   . Cirrhosis     bx proven steatohepatitis with cirrhosis (2010); per note from Feb 2011,  received Hep A and B vaccines in 2010  . HTN (hypertension)   . RLS (restless legs syndrome)   . Sleep apnea   . Hyperlipidemia   . IDA (iron deficiency anemia)   . GERD (gastroesophageal reflux disease)   . Depression   . Peripheral neuropathy   . Urothelial cancer     2010, paillary low-grade, h/o recurrence 2011  . B12 deficiency   . Psoriasis   . 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 Anemia due to multiple mechanisms 05/13/2011  . History of alcohol abuse 05/13/2011  . ANEMIA-IRON DEFICIENCY 03/09/2009  . S/P endoscopy May 2012    4 columns grade II esophageal varices; due for repeat in Nov 2013   . S/P colonoscopy May 2012    Tubular adenoma  . Low back pain   . Cancer     bladder ca 05/2009 removal and  w/chemo wash    Past Surgical History  Procedure Date  . Carpel tunnel   . Shoulder surgery   . Adrenal mass surgery 05/2009    benign, left  . Bladder surgery 01/2009 and 06/2010    cancer 2010, small recurrence in 06/2010  . Colonoscopy 04/2009    moderate int hemorrhoids, rare sigmoid diverticula, one mm sessile hyperplastic rectal polyp  . Esophagogastroduodenoscopy 03/2009    small hh  . Small bowel capsule endoscopy 03/2009    couple of small benign appearing erosions, nonbleeding  . Egd/tcs 08/2007    small hiatal hernia, pancolonic diverticula, friable anal canal, 3cm salmon colored  epithelium in distal esophagus, bx negative for Barrett's  . Skin cancer excision Oct 2012    left arm    Family History  Problem Relation Age of Onset  . Cirrhosis Father     etoh  . Colon cancer Neg Hx   . Kidney cancer Mother   . Cancer Mother   . HIV Brother   . Cirrhosis Brother     nash    History  Substance Use Topics  . Smoking status: Current Everyday Smoker -- 1.0 packs/day    Types: Cigarettes  . Smokeless tobacco: Not on file  . Alcohol Use: No     drank heavily for few years in 20s      Review of Systems  Constitutional: Negative for fever,  chills and diaphoresis.  HENT: Negative for congestion, sore throat and neck pain.   Eyes: Negative.   Respiratory: Negative for chest tightness and shortness of breath.   Cardiovascular: Negative for chest pain.  Gastrointestinal: Positive for nausea, vomiting, abdominal pain, diarrhea, blood in stool, hematochezia and hematemesis. Negative for heartburn.  Genitourinary: Negative.   Musculoskeletal: Negative for joint swelling and arthralgias.  Skin: Negative.  Negative for rash and wound.  Neurological: Negative for dizziness, weakness, light-headedness, numbness and headaches.  Hematological: Negative.   Psychiatric/Behavioral: Negative.     Allergies  Tylenol  Home Medications   Current Outpatient Rx  Name Route Sig Dispense Refill  . ASPIRIN EC 81 MG PO TBEC Oral Take 81 mg by mouth daily.      . ATENOLOL 100 MG PO TABS Oral Take 100 mg by mouth daily.      . OMEGA-3 FATTY ACIDS 1000 MG PO CAPS Oral Take 1 g by mouth 3 (three) times daily.     . FUROSEMIDE 20 MG PO TABS Oral Take 20 mg by mouth daily.     [name] GABAPENTIN 300 MG PO CAPS Oral Take 1,200 mg by mouth 2 (two) times daily.     . INSULIN ASPART 100 UNIT/ML [place] SOLN Subcutaneous Inject 20 Units into the skin 2 (two) times daily. Sliding Scale    . INSULIN GLARGINE 100 UNIT/ML [place] SOLN Subcutaneous Inject 100 Units into the skin at bedtime.     [name] LISINOPRIL 20 MG PO TABS Oral Take 10 mg by mouth daily.     [name] MAGNESIUM OXIDE 400 MG PO TABS Oral Take 400 mg by mouth daily.      [name] METFORMIN HCL 1000 MG PO TABS Oral Take 1,000 mg by mouth 2 (two) times daily with a meal.      . METRONIDAZOLE 500 MG PO TABS Oral Take 1 tablet (500 mg total) by mouth 3 (three) times daily. For 7 days 21 tablet 0  . MULTIVITAMINS PO CAPS Oral Take 1 capsule by mouth daily.      [name] OMEPRAZOLE 40 MG PO CPDR Oral Take 40 mg by mouth daily.      . OXYCODONE HCL 5 MG PO TABS Oral Take 2 tablets (10 mg total) by mouth every 4 (four) hours as needed for  pain. 20 tablet 0  . PRAMIPEXOLE DIHYDROCHLORIDE 0.5 MG PO TABS Oral Take 0.5 mg by mouth at bedtime.       BP 84/48  Pulse 103  Temp 98.9 F (37.2 C)  Resp 16  Ht 5\' 7"  (1.702 m)  Wt 240 lb (108.863 kg)  BMI 37.59 kg/m2  SpO2 98%  Physical Exam  Nursing note and vitals reviewed. Constitutional: He is  oriented to person, place, and time. He appears well-developed and well-nourished. No distress.  HENT:  Head: Normocephalic and atraumatic.  Eyes: Conjunctivae are normal.       No pallor noted.  Neck: Normal range of motion.  Cardiovascular: Normal rate, regular rhythm, normal heart sounds and intact distal pulses.   Pulmonary/Chest: Effort normal and breath sounds normal. He has no wheezes. He has no rhonchi.  Abdominal: Soft. He exhibits distension. Bowel sounds are increased. There is no tenderness. There is no rigidity, no rebound and no guarding.  Musculoskeletal: Normal range of motion.  Neurological: He is alert and oriented to person, place, and time.  Skin: Skin is warm and dry. He is not diaphoretic.  Psychiatric: He has a normal mood and affect.    ED Course  Procedures (including critical care time)  Labs Reviewed  GLUCOSE, CAPILLARY - Abnormal; Notable for the following:    Glucose-Capillary 345 (*)    All other components within normal limits  COMPREHENSIVE METABOLIC PANEL - Abnormal; Notable for the following:    Potassium 5.4 (*)    Glucose, Bld 359 (*)    BUN 29 (*)    Albumin 2.8 (*)    AST 38 (*)    GFR calc non Af Amer 64 (*)    GFR calc Af Amer 75 (*)    All other components within normal limits  CBC - Abnormal; Notable for the following:    RBC 3.56 (*)    Hemoglobin 11.4 (*)    HCT 34.2 (*)    Platelets 124 (*)    All other components within normal limits  DIFFERENTIAL - Abnormal; Notable for the following:    Monocytes Absolute 1.1 (*)    All other components within normal limits  PROTIME-INR - Abnormal; Notable for the following:     Prothrombin Time 18.5 (*)    INR 1.51 (*)    All other components within normal limits  APTT - Abnormal; Notable for the following:    aPTT 38 (*)    All other components within normal limits  SAMPLE TO BLOOD BANK  POCT CBG MONITORING  POCT OCCULT BLOOD STOOL, DEVICE  PREPARE RBC (CROSSMATCH)   No results found.   No diagnosis found.    Visualized 1 moderate sized soft,  Maroon stool in bedside commode. No further emesis while in ed.  Octreotide started,  NS 2 liters given, second Iv started,  Blood transfusion ordered.   Recent CT scan revealing for significant esophageal varices.    Call placed to Dr. Rhea Belton with [facility] (no ICU beds available at [facility]).  Will consult for pt,  Requesting critical care admission.  Dr. Hyacinth Meeker spoke with critical care team,  Who will admit in transfer to [facility].  MDM  Upper GI bleed.        Candis Musa, PA 11/25/11 [phone]

## 2011-11-25 NOTE — Plan of Care (Signed)
Problem: Phase I Progression Outcomes Goal: OOB as tolerated unless otherwise ordered On bedrest Goal: Initial discharge plan identified Outcome: Progressing Will be discharged home with wife Goal: Hemodynamically stable Received 2 units PRBCs this am and SBP is >100  Problem: Phase II Progression Outcomes Goal: No active bleeding Outcome: Completed/Met Date Met:  11/25/11 EGD confirmed no active bleeding at present. Varices banded. Continues with maroon colored stool. Goal: Tolerating diet Outcome: Progressing On clear liquids

## 2011-11-26 DIAGNOSIS — K922 Gastrointestinal hemorrhage, unspecified: Secondary | ICD-10-CM

## 2011-11-26 DIAGNOSIS — E119 Type 2 diabetes mellitus without complications: Secondary | ICD-10-CM

## 2011-11-26 DIAGNOSIS — K7030 Alcoholic cirrhosis of liver without ascites: Secondary | ICD-10-CM

## 2011-11-26 DIAGNOSIS — I959 Hypotension, unspecified: Secondary | ICD-10-CM

## 2011-11-26 DIAGNOSIS — K7460 Unspecified cirrhosis of liver: Secondary | ICD-10-CM

## 2011-11-26 DIAGNOSIS — F1011 Alcohol abuse, in remission: Secondary | ICD-10-CM

## 2011-11-26 LAB — BASIC METABOLIC PANEL
BUN: 17 mg/dL (ref 6–23)
Creatinine, Ser: 0.99 mg/dL (ref 0.50–1.35)
GFR calc Af Amer: >90 mL/min (ref >90)
GFR calc non Af Amer: >90 mL/min (ref >90)
Glucose, Bld: 151 mg/dL — ABNORMAL HIGH (ref 70–99)

## 2011-11-26 LAB — GLUCOSE, CAPILLARY
Glucose-Capillary: 196 mg/dL — ABNORMAL HIGH (ref 70–99)
Glucose-Capillary: 179 mg/dL — ABNORMAL HIGH (ref 70–99)
Glucose-Capillary: 204 mg/dL — ABNORMAL HIGH (ref 70–99)

## 2011-11-26 LAB — CBC
HCT: 28.6 % — ABNORMAL LOW (ref 39.0–52.0)
MCH: 30.6 pg (ref 26.0–34.0)
MCHC: 33.2 g/dL (ref 30.0–36.0)
RDW: 14.9 % (ref 11.5–15.5)

## 2011-11-26 MED ORDER — DEXTROSE 5 % IV SOLN
1.0000 g | INTRAVENOUS | Status: DC
  Administered 2011-11-27 – 2011-11-28 (×2): 1 g via INTRAVENOUS
  Filled 2011-11-26 (×4): qty 10

## 2011-11-26 MED ORDER — OCTREOTIDE ACETATE 500 MCG/ML IJ SOLN
50.0000 ug/h | INTRAMUSCULAR | Status: DC
  Administered 2011-11-26 – 2011-11-28 (×3): 50 ug/h via INTRAVENOUS
  Filled 2011-11-26 (×10): qty 1

## 2011-11-26 MED ORDER — PRAMIPEXOLE DIHYDROCHLORIDE 0.25 MG PO TABS
0.5000 mg | ORAL_TABLET | Freq: Every day | ORAL | Status: DC
  Administered 2011-11-26 – 2011-11-27 (×2): 0.5 mg via ORAL
  Filled 2011-11-26 (×3): qty 2

## 2011-11-26 MED ORDER — OXYCODONE HCL 5 MG PO TABS: 10.0000 mg | ORAL_TABLET | ORAL | Status: DC | PRN

## 2011-11-26 MED ORDER — PANTOPRAZOLE SODIUM 40 MG PO TBEC
40.0000 mg | DELAYED_RELEASE_TABLET | Freq: Two times a day (BID) | ORAL | Status: DC
  Administered 2011-11-26 – 2011-11-28 (×4): 40 mg via ORAL
  Filled 2011-11-26 (×4): qty 1

## 2011-11-26 MED ORDER — INSULIN GLARGINE 100 UNIT/ML ~~LOC~~ SOLN
50.0000 [IU] | Freq: Every day | SUBCUTANEOUS | Status: DC
  Administered 2011-11-26 – 2011-11-27 (×2): 50 [IU] via SUBCUTANEOUS
  Filled 2011-11-26: qty 3

## 2011-11-26 MED ORDER — MAGNESIUM OXIDE 400 MG PO TABS
400.0000 mg | ORAL_TABLET | Freq: Every day | ORAL | Status: DC
  Administered 2011-11-26 – 2011-11-28 (×3): 400 mg via ORAL
  Filled 2011-11-26 (×3): qty 1

## 2011-11-26 NOTE — Progress Notes (Signed)
Inpatient Diabetes Program Recommendations  AACE/ADA: New Consensus Statement on Inpatient Glycemic Control (2009)  Target Ranges:  Prepandial:   less than 140 mg/dL      Peak postprandial:   less than 180 mg/dL (1-2 hours)      Critically ill patients:  140 - 180 mg/dL   Reason for Visit: CBG's greater than 200 mg/dL.  Inpatient Diabetes Program Recommendations Insulin - Basal: Agree with start of Lantus 50 units daily (1/2 of home dose of Lantus) HgbA1C: Please order A1C to determine prehospitalization glycemic control.  Note: Will follow.

## 2011-11-26 NOTE — Progress Notes (Signed)
[facility] Progress Note  SUBJECTIVE: Feels okay. No further bleeding. Eating solids.   OBJECTIVE:  Vital signs in last 24 hours: Temp:  [98.4 F (36.9 C)-99 F (37.2 C)] 98.5 F (36.9 C) (01/02 0745) Pulse Rate:  [77-90] 90  (01/02 0700) Resp:  [15-27] 20  (01/02 0700) BP: (87-137)/(44-81) 126/78 mmHg (01/02 0700) SpO2:  [93 %-100 %] 98 % (01/02 0700) Weight:  [109.1 kg (240 lb 8.4 oz)] 240 lb 8.4 oz (109.1 kg) (01/02 0500) Last BM Date: 11/25/11 General:   Well-developed,  white male in NAD Heart:  Regular rate and rhythm Abdomen:  Soft, nontender and nondistended. Normal bowel sounds. Extremities:  Without edema. Neurologic:  Alert and  oriented x4;  grossly normal neurologically. Psych:  Cooperative. Normal mood and affect.  Lab Results:  Basename 11/26/11 0445 11/25/11 2120 11/25/11 1500 11/24/11 2340  WBC 3.5* -- -- 10.0  HGB 9.5* 9.6* 9.6* --  HCT 28.6* 28.7* 28.8* --  PLT 68* -- -- 124*   BMET  Basename 11/26/11 0445 11/25/11 1850 11/24/11 2340  NA 137 137 136  K 4.5 4.6 5.4*  CL 106 107 103  CO2 24 23 23   GLUCOSE 151* 201* 359*  BUN 17 24* 29*  CREATININE 0.99 0.97 1.24  CALCIUM 8.4 8.4 9.3   LFT  Basename 11/24/11 2340  PROT 6.7  ALBUMIN 2.8*  AST 38*  ALT 37  ALKPHOS 64  BILITOT 0.5  BILIDIR --  IBILI --   PT/INR  Basename 11/25/11 1850 11/24/11 2340  LABPROT 17.8* 18.5*  INR 1.44 1.51*    ASSESSMENT / PLAN: 1. Variceal bleed, s/p EGD with banding yesterday. No further bleeding. Will continue Octreotide infusion for total of 72 hours. Patient got Rocephin yesterday and early this am but then it was discontinued. Should get additional 3 days of antibiotics. Even in absence of ascites / SBP, antibiotics can help reduce potential for infection and even recurrent variceal bleeding. Will restart Rocephin. Can transition to oral antibiotics after that. Repeat EGD with possible rebanding in about 4-6 weeks 2. 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  S/P EGD with banding yesterday. See #1.  3. Anemia of acute blood loss, s/p 2 units of blood yesterday. Hemoglobin 9.5 today.   LOS: 2 days   Willette Cluster  11/26/2011, 9:55 AM

## 2011-11-26 NOTE — Progress Notes (Addendum)
Name: Kyle [name] MRN: [id number] DOB: 03-05-1956  LOS: 2  CRITICAL CARE ADMISSION NOTE  History of Present Illness: 56 y/o male with cirrhosis from steatohepatitis admitted from [facility] ED after one day of hematemesis and bright red blood per rectum.  He is known to have esophageal varices from prior EGD's (no prior episodes of bleeding).  He was at his brother's yesterday when he became suddenly nauseated with some cramping abdominal pain.  He then had two episodes of bright red emesis followed by hematochezia.  He went to the [facility] ED where he had another episode of both around 2100 on 11/24/11.  He was transiently hypotensive.  He was sent to our facility for further management.  Lines / Drains: 11/25/11 R IJ CVL >>  Cultures / Sepsis markers:  Antibiotics: 11/25/11 ceftriaxone >> 1/2  Tests / Events: 1/1 EGD with portal gastropathy and esophageal varices s/p banding x 5.   Subjective: Hemodynamically stable Hgb 9.6 >> 9.6 >> 9.5 Awake and appropriate, comfortable  Vital Signs:   Filed Vitals:   11/26/11 0500 11/26/11 0600 11/26/11 0700 11/26/11 0745  BP: 121/77 130/81 126/78   Pulse: 78 88 90   Temp:    98.5 F (36.9 C)  TempSrc:    Oral  Resp: 15 26 20    Height:      Weight: 109.1 kg (240 lb 8.4 oz)     SpO2: 99% 97% 98%     Physical Examination: Gen:no acute distress HEENT: NCAT, PERRL, EOMi, OP clear, MMM, some dried blood on lips Neck: supple without masses PULM: CTA B CV: RRR, no mgr, no JVD AB: BS+, soft, nontender, no hsm Ext: warm, trace edema, no clubbing, no cyanosis Derm: no rash or skin breakdown Neuro: A&Ox4, CN II-XII intact, strength 5/5 in all 4 extremities Psyche: Normal mood and affect  Labs and Imaging:    CBC    Component Value Date/Time   WBC 3.5* 11/26/2011 0445   RBC 3.10* 11/26/2011 0445   HGB 9.5* 11/26/2011 0445   HCT 28.6* 11/26/2011 0445   PLT 68* 11/26/2011 0445   MCV 92.3 11/26/2011 0445   MCH 30.6 11/26/2011 0445   MCHC 33.2 11/26/2011 0445   RDW 14.9 11/26/2011 0445   LYMPHSABS 2.1 11/24/2011 2340   MONOABS 1.1* 11/24/2011 2340   EOSABS 0.1 11/24/2011 2340   BASOSABS 0.0 11/24/2011 2340   BMET    Component Value Date/Time   NA 137 11/26/2011 0445   K 4.5 11/26/2011 0445   CL 106 11/26/2011 0445   CO2 24 11/26/2011 0445   GLUCOSE 151* 11/26/2011 0445   BUN 17 11/26/2011 0445   CREATININE 0.99 11/26/2011 0445   CREATININE 1.03 11/11/2011 1335   CALCIUM 8.4 11/26/2011 0445   GFRNONAA >90 11/26/2011 0445   GFRAA >90 11/26/2011 0445     Assessment and Plan:  56 y/o male with steatohepatitis related cirrhosis and known esophageal varices presents with an acute brisk upper GI bleed leading to transient hypotension.  EGD = esophageal varices s/p bands 11/25/11  Upper GI bleed (11/25/2011)   Assessment: Appears to have stopped s/p banding   Plan:  -change ppi gtt to ppi bid -octreotide gtt for 3-5 days post-procedure (done 11/25/11) -will need repeat EGD in 2-4 weeks to insure bands in place -change serial CBC to qd  ANEMIA-IRON DEFICIENCY (03/09/2009)   Assessment: per GI Bleed   Plan: per GI bleed  HEPATIC CIRRHOSIS (05/21/2009)   Assessment: due to steatohepatitis, prior EtOH?  Plan:  -d/c ceftriaxone, no current evidence to support SBP -follow INR  History of alcohol abuse (05/13/2011)   Assessment: says he is dry now   Plan:  -monitor for withdrawal  Hypotension (11/25/2011), resolved   Assessment: due to gi bleed   Plan:  -leave CVC in place for another day to insure no evidence recurrent GIB  DM  Assessment:   Plan: -SSI + CBG -restart half usual lantus until taking good PO: on 100 units qhs at home, start 50 units 11/26/11  Best practices / Disposition: can likely transfer to SDU or floor 1/2  Feeding/protein malnutrition: on clear diet, will advance when OK with GI Analgesia: n/a Sedation: n/a Thromboprohylaxis: SCD HOB >30 degrees Ulcer prophylaxis: PPI bid Glucose control/hyperglycemia: ssi + CBG  Levy Pupa, MD,  PhD 11/26/2011, 8:41 AM [facility] [phone] or if no answer [phone]

## 2011-11-26 NOTE — Progress Notes (Signed)
Patient transferred from 2300 Stable, Alert and oriented. No distress noted. Patient from home with wife, Full code , skin intact.  On RA.  Vitals stable. Plan to  Continue monitor HGB, and HCT.  Patient oriented to room and started diabetic education with videos

## 2011-11-26 NOTE — Progress Notes (Signed)
Patient seen and I agree with the above documentation, including the assessment and plan. No evidence of further bleeding. Variceal band ligation yesterday.  Needs 72 hrs of octreotide infusion Repeat EGD for repeat banding (if necessary) in 3-4 weeks (continue banding protocol until varices eradicated) Follow HCTs

## 2011-11-27 ENCOUNTER — Encounter (HOSPITAL_COMMUNITY): Payer: Self-pay | Admitting: Internal Medicine

## 2011-11-27 DIAGNOSIS — K922 Gastrointestinal hemorrhage, unspecified: Secondary | ICD-10-CM

## 2011-11-27 DIAGNOSIS — K7030 Alcoholic cirrhosis of liver without ascites: Secondary | ICD-10-CM

## 2011-11-27 DIAGNOSIS — E119 Type 2 diabetes mellitus without complications: Secondary | ICD-10-CM

## 2011-11-27 LAB — GLUCOSE, CAPILLARY: Glucose-Capillary: 136 mg/dL — ABNORMAL HIGH (ref 70–99)

## 2011-11-27 LAB — CBC
HCT: 27.6 % — ABNORMAL LOW (ref 39.0–52.0)
Hemoglobin: 9.2 g/dL — ABNORMAL LOW (ref 13.0–17.0)
MCV: 93.2 fL (ref 78.0–100.0)
RBC: 2.96 MIL/uL — ABNORMAL LOW (ref 4.22–5.81)
WBC: 3.9 10*3/uL — ABNORMAL LOW (ref 4.0–10.5)

## 2011-11-27 LAB — BASIC METABOLIC PANEL
BUN: 10 mg/dL (ref 6–23)
CO2: 25 mEq/L (ref 19–32)
Chloride: 103 mEq/L (ref 96–112)
Creatinine, Ser: 1 mg/dL (ref 0.50–1.35)
Glucose, Bld: 97 mg/dL (ref 70–99)

## 2011-11-27 NOTE — Progress Notes (Signed)
Name: Kyle [name] MRN: [id number] DOB: 1956-11-14  LOS: 3 PCCM PROGRESS  NOTE  History of Present Illness: 56 y/o male with cirrhosis from steatohepatitis admitted from [facility] ED 1/1 after one day of hematemesis and bright red blood per rectum.  He is known to have esophageal varices from prior EGD's (no prior episodes of bleeding).  He was at his brother's one day pta  when he became suddenly nauseated with some cramping abdominal pain.  He then had two episodes of bright red emesis followed by hematochezia.  He went to the [facility] ED where he had another episode of both around 2100 on 11/24/11.  He was transiently hypotensive.  He was sent to our facility for further management am 1/1  Lines / Drains: 11/25/11 R IJ CVL >>   Antibiotics: 11/25/11 ceftriaxone >>   Tests / Events: 1/1 EGD with portal gastropathy and esophageal varices s/p banding x 5.   Subjective:  Awake and appropriate, comfortable, sitting in chair nad  Vital Signs:   Filed Vitals:   11/26/11 1437 11/26/11 2020 11/27/11 0433 11/27/11 1339  BP: 143/76 134/78 131/81 118/71  Pulse: 79 82 83 79  Temp: 97.8 F (36.6 C) 98.9 F (37.2 C) 98.4 F (36.9 C) 98.7 F (37.1 C)  TempSrc:  Oral Oral Oral  Resp: 18 20 22 20   Height:      Weight:   242 lb 8.1 oz (110 kg)   SpO2: 98% 97% 94% 95%    Physical Examination: Gen:no acute distress HEENT: NCAT, PERRL, EOMi, OP clear, MMM, some dried blood on lips Neck: supple without masses PULM: CTA B CV: RRR, no mgr, no JVD AB: BS+, soft, nontender, no hsm Ext: warm, trace edema, no clubbing, no cyanosis Derm: no rash or skin breakdown Neuro: A&Ox4, CN II-XII intact, strength 5/5 in all 4 extremities Psyche: Normal mood and affect  Labs and Imaging:     Lab 11/27/11 0530 11/26/11 0445 11/25/11 1850  NA 138 137 137  K 4.0 4.5 4.6  CL 103 106 107  CO2 25 24 23   BUN 10 17 24*  CREATININE 1.00 0.99 0.97  GLUCOSE 97 151* 201*    Lab 11/27/11 0530 11/26/11 0445 11/25/11 2120  11/24/11 2340  HGB 9.2* 9.5* 9.6* --  HCT 27.6* 28.6* 28.7* --  WBC 3.9* 3.5* -- 10.0  PLT 65* 68* -- 124*       Assessment and Plan:  56 y/o male with steatohepatitis related cirrhosis and known esophageal varices presented with an acute brisk upper GI bleed leading to transient hypotension.  EGD = esophageal varices s/p bands 11/25/11  Upper GI bleed (11/25/2011)   Assessment: Appears to have stopped s/p banding   Plan:  - ppi bid -octreotide gtt for 3 days total so stop am 1/4  -will need repeat EGD in 2-4 weeks to insure bands in place - CBC qd  ANEMIA-IRON DEFICIENCY (03/09/2009)   Assessment: per GI Bleed   Plan: per GI bleed  HEPATIC CIRRHOSIS (05/21/2009)   Assessment: due to steatohepatitis, prior EtOH?   Plan:  -d/c ceftriaxone, no current evidence to support SBP -follow INR  History of alcohol abuse (05/13/2011)   Assessment: says he is dry now   Plan:  -monitor for withdrawal  Hypotension (11/25/2011), resolved   Assessment: due to gi bleed   Plan:  -leave CVC in place for another day to insure no evidence recurrent GIB  DM  Assessment:   Plan: -SSI + CBG -restart half  usual lantus until taking good PO: on 100 units qhs at home, start 50 units 11/26/11   Thrombocytopenia Lab Results  Component Value Date   PLT 65* 11/27/2011   PLT 68* 11/26/2011   PLT 124* 11/24/2011  ? Component hypersplenism, appears to be leveling off   Sandrea Hughs, MD Pulmonary and Critical Care Medicine [facility] Cell [phone]

## 2011-11-27 NOTE — Progress Notes (Signed)
[facility] Progress Note  Subjective: No further bleeding, no melena or hematemesis.  Eating fine, had hamburger for lunch.  Mild chest soreness. Wants to continue GI care in [place] after d/c  Objective:  Vital signs in last 24 hours: Temp:  [98.4 F (36.9 C)-98.9 F (37.2 C)] 98.7 F (37.1 C) (01/03 1339) Pulse Rate:  [79-83] 79  (01/03 1339) Resp:  [20-22] 20  (01/03 1339) BP: (118-134)/(71-81) 118/71 mmHg (01/03 1339) SpO2:  [94 %-97 %] 95 % (01/03 1339) Weight:  [242 lb 8.1 oz (110 kg)] 242 lb 8.1 oz (110 kg) (01/03 0433) Last BM Date: 11/27/11 General:   Alert,  Well-developed Heart:  Regular rate and rhythm; no murmurs Abdomen:  Soft, nontender and nondistended. Normal bowel sounds, without guarding, and without rebound.   Extremities:  Without edema. Neurologic:  Alert and  oriented x4;  grossly normal neurologically. Psych:  Alert and cooperative. Normal mood and affect.  Intake/Output from previous day: 01/02 0701 - 01/03 0700 In: 850 [P.O.:600; I.V.:250] Out: 275 [Urine:275] Intake/Output this shift:    Lab Results:  Basename 11/27/11 0530 11/26/11 0445 11/25/11 2120 11/24/11 2340  WBC 3.9* 3.5* -- 10.0  HGB 9.2* 9.5* 9.6* --  HCT 27.6* 28.6* 28.7* --  PLT 65* 68* -- 124*   BMET  Basename 11/27/11 0530 11/26/11 0445 11/25/11 1850  NA 138 137 137  K 4.0 4.5 4.6  CL 103 106 107  CO2 25 24 23   GLUCOSE 97 151* 201*  BUN 10 17 24*  CREATININE 1.00 0.99 0.97  CALCIUM 9.0 8.4 8.4   LFT  Basename 11/24/11 2340  PROT 6.7  ALBUMIN 2.8*  AST 38*  ALT 37  ALKPHOS 64  BILITOT 0.5  BILIDIR --  IBILI --   PT/INR  Basename 11/27/11 0530 11/25/11 1850  LABPROT 17.3* 17.8*  INR 1.39 1.44    Assessment / Plan: 1. Variceal bleed, s/p EGD with banding 11/25/11. No further bleeding. Continue Octreotide infusion for total of 72 hours, and then stop.  Advance to reg diet now. Complete 5 days of abx. Will need to followup with Dr. Jena Gauss in  [place] after d/c for repeat EGD with banding until varices are fully eradicated (this will occur q3-4 weeks until varices gone).  2. 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 S/P EGD with banding yesterday. See #1.  Low Na diet.  Needs to avoid all ETOH in the future.  3. Anemia of acute blood loss, s/p 2 units of blood yesterday. Hemoglobin stable   Principal Problem:  *Upper GI bleed Active Problems:  BLADDER CANCER  ANEMIA-IRON DEFICIENCY  HEPATIC CIRRHOSIS  History of alcohol abuse  Hypotension     LOS: 3 days   Kyle [name]  11/27/2011, 8:04 PM

## 2011-11-28 LAB — TYPE AND SCREEN
ABO/RH(D): O POS
Unit division: 0
Unit division: 0

## 2011-11-28 LAB — GLUCOSE, CAPILLARY
Glucose-Capillary: 114 mg/dL — ABNORMAL HIGH (ref 70–99)
Glucose-Capillary: 358 mg/dL — ABNORMAL HIGH (ref 70–99)
Glucose-Capillary: 98 mg/dL (ref 70–99)

## 2011-11-28 MED ORDER — CIPROFLOXACIN HCL 500 MG PO TABS
500.0000 mg | ORAL_TABLET | Freq: Two times a day (BID) | ORAL | Status: DC
  Filled 2011-11-28: qty 1

## 2011-11-28 MED ORDER — CIPROFLOXACIN HCL 500 MG PO TABS: 500.0000 mg | ORAL_TABLET | Freq: Two times a day (BID) | ORAL | Status: DC

## 2011-11-28 MED ORDER — CIPROFLOXACIN HCL 500 MG PO TABS: 500.0000 mg | ORAL_TABLET | Freq: Two times a day (BID) | ORAL | Status: AC

## 2011-11-28 MED ORDER — PNEUMOCOCCAL VAC POLYVALENT 25 MCG/0.5ML IJ INJ
0.5000 mL | INJECTION | Freq: Once | INTRAMUSCULAR | Status: DC
  Filled 2011-11-28: qty 0.5

## 2011-11-28 MED ORDER — PANTOPRAZOLE SODIUM 40 MG PO TBEC: 40.0000 mg | DELAYED_RELEASE_TABLET | Freq: Two times a day (BID) | ORAL | Status: DC

## 2011-11-28 NOTE — Discharge Summary (Signed)
Physician Discharge Summary  Patient ID: Kyle [name] MRN: [id number] DOB/AGE: Apr 08, 1956 56 y.o.  Admit date: 11/24/2011 Discharge date: 11/28/2011  Admission Diagnoses: Acute GI bleed  Discharge Diagnoses:  Principal Problem:  *Upper GI bleed Active Problems:  BLADDER CANCER  ANEMIA-IRON DEFICIENCY  HEPATIC CIRRHOSIS  History of alcohol abuse  Hypotension   Lines / Drains:  11/25/11 R IJ CVL >>1/4  Antibiotics:  11/25/11 ceftriaxone >> 1/4  Cipro (empiric for SBP) 1/4 start date.   Tests / Events:  1/1 EGD with portal gastropathy and esophageal varices s/p banding x 5.   History of Present Illness: 56 y/o male with cirrhosis from steatohepatitis admitted from [facility] 1/1 after one day of hematemesis and bright red blood per rectum. He is known to have esophageal varices from prior EGD's (no prior episodes of bleeding). He was at his brother's one day pta when he became suddenly nauseated with some cramping abdominal pain. He then had two episodes of bright red emesis followed by hematochezia. He went to the [facility] ED where he had another episode of both around 2100 on 11/24/11. He was transiently hypotensive. He was sent to our facility for further management am 1/1   Hospital Course:  Upper GI bleed (11/25/2011) Assessment: Appears to have stopped s/p banding. Presented as described above. Underwent banding X 5 via EGD on 1/1 by GI. Now Hemodynamically stable with out evidence of bleeding.  Plan:  - ppi bid  -completed octreotide gtt x 72 hrs. Total so stop am 1/4  -will need repeat EGD in 2-4 weeks to insure bands in place, with Rourk.   ANEMIA-IRON DEFICIENCY (03/09/2009)   Lab  11/27/11 0530  11/26/11 0445  11/25/11 2120   HGB  9.2*  9.5*  9.6*   Assessment: per d/t Bleed Plan: per GI bleed   HEPATIC CIRRHOSIS (05/21/2009)  Lab Results   Component  Value  Date    ALT  37  11/24/2011    AST  38*  11/24/2011    ALKPHOS  64  11/24/2011    BILITOT  0.5  11/24/2011    Lab  Results   Component  Value  Date    INR  1.39  11/27/2011    INR  1.44  11/25/2011    INR  1.51*  11/24/2011   Plan:  Assessment: due to steatohepatitis, prior EtOH? Vs [name] with portal HTN.  Plan:  -complete 2 more d Cipro/ Per GI recs.   History of alcohol abuse (05/13/2011) Assessment: says he is dry now Plan:  -monitor for withdrawal   Hypotension (11/25/2011), resolved Assessment: due to gi bleed Now resolved, will resume his home meds  DM  Plan:  -restart half usual lantus until taking good PO: on 100 units qhs at home  Thrombocytopenia  Lab Results   Component  Value  Date    PLT  65*  11/27/2011    PLT  68*  11/26/2011    PLT  124*  11/24/2011   ? Component hypersplenism, appears to be leveling off   Discharge Exam: Vital Signs:  Temp: [98.3 F (36.8 C)-98.7 F (37.1 C)] 98.3 F (36.8 C) (01/04 0441)  Pulse Rate: [79-83] 83 (01/04 0441)  Resp: [20] 20 (01/04 0441)  BP: (118-134)/(71-81) 132/79 mmHg (01/04 0441)  SpO2: [95 %-97 %] 95 % (01/04 0441)  Weight: [108.818 kg (239 lb 14.4 oz)] 239 lb 14.4 oz (108.818 kg) (01/04 0315)  Physical Examination:  Gen:no acute distress  HEENT: NCAT, PERRL,  EOMi, OP clear, MMM, some dried blood on lips  Neck: supple without masses  PULM: CTA B  CV: RRR, no mgr, no JVD  AB: BS+, soft, nontender, no hsm  Ext: warm, trace edema, no clubbing, no cyanosis  Derm: no rash or skin breakdown  Neuro: A&Ox4, CN II-XII intact, strength 5/5 in all 4 extremities  Psyche: Normal mood and affect  Labs at discharge Lab Results  Component Value Date   CREATININE 1.00 11/27/2011   BUN 10 11/27/2011   NA 138 11/27/2011   K 4.0 11/27/2011   CL 103 11/27/2011   CO2 25 11/27/2011   Lab Results  Component Value Date   WBC 3.9* 11/27/2011   HGB 9.2* 11/27/2011   HCT 27.6* 11/27/2011   MCV 93.2 11/27/2011   PLT 65* 11/27/2011   Lab Results  Component Value Date   ALT 37 11/24/2011   AST 38* 11/24/2011   ALKPHOS 64 11/24/2011   BILITOT 0.5 11/24/2011    Lab Results  Component Value Date   INR 1.39 11/27/2011   INR 1.44 11/25/2011   INR 1.51* 11/24/2011    Current radiology studies No results found.  Disposition:  Home or Self Care  Discharge Orders    Future Appointments: Provider: Department: Dept Phone: Center:   12/05/2011 9:10 AM [facility] [phone] None   01/02/2012 9:10 AM [facility] [phone] None   01/09/2012 11:00 AM Dellis Anes, [facility] [phone] None     Future Orders Please Complete By Expires   Diet - low sodium heart healthy      Increase activity slowly      Call MD for:  temperature >100.4      Call MD for:  persistant nausea and vomiting      Call MD for:  severe uncontrolled pain      Call MD for:  extreme fatigue      (HEART FAILURE PATIENTS) Call MD:  Anytime you have any of the following symptoms: 1) 3 pound weight gain in 24 hours or 5 pounds in 1 week 2) shortness of breath, with or without a dry hacking cough 3) swelling in the hands, feet or stomach 4) if you have to sleep on extra pillows at night in order to breathe.      Call MD for:  persistant dizziness or light-headedness        Current Discharge Medication List    START taking these medications   Details  ciprofloxacin (CIPRO) 500 MG tablet Take 1 tablet (500 mg total) by mouth 2 (two) times daily. Qty: 6 tablet, Refills: 0    pantoprazole (PROTONIX) 40 MG tablet Take 1 tablet (40 mg total) by mouth 2 (two) times daily before a meal. Qty: 60 tablet, Refills: 3      CONTINUE these medications which have NOT CHANGED   Details  atenolol (TENORMIN) 100 MG tablet Take 100 mg by mouth daily.      fish oil-omega-3 fatty acids 1000 MG capsule Take 1 g by mouth 3 (three) times daily.     furosemide (LASIX) 20 MG tablet Take 20 mg by mouth daily.     gabapentin (NEURONTIN) 300 MG capsule Take 1,200 mg by mouth 2 (two) times daily.     insulin aspart (NOVOLOG) 100 UNIT/ML injection  Inject 20 Units into the skin 2 (two) times daily. Sliding Scale    insulin glargine (LANTUS) 100 UNIT/ML injection Inject 100 Units into the skin at bedtime.  lisinopril (PRINIVIL,ZESTRIL) 20 MG tablet Take 10 mg by mouth daily.     magnesium oxide (MAG-OX) 400 MG tablet Take 400 mg by mouth daily.      metFORMIN (GLUCOPHAGE) 1000 MG tablet Take 1,000 mg by mouth 2 (two) times daily with a meal.      Multiple Vitamin (MULTIVITAMIN) capsule Take 1 capsule by mouth daily.      pramipexole (MIRAPEX) 0.5 MG tablet Take 0.5 mg by mouth at bedtime.       STOP taking these medications     oxyCODONE (OXY IR/ROXICODONE) 5 MG immediate release tablet      aspirin EC 81 MG tablet      metroNIDAZOLE (FLAGYL) 500 MG tablet      omeprazole (PRILOSEC) 40 MG capsule        Follow-up Information    Follow up with [name], MD. Make an appointment in 2 weeks. (two to three weeks. )    Contact information:   [address] [place] [address] [phone]          Discharged Condition: good  Signed: BABCOCK,PETE 11/28/2011, 10:25 AM   Pt independently  seen and examined and available cxr's reviewed and I agree with above findings/ imp/ plan   Sandrea Hughs, MD Pulmonary and Critical Care Medicine [facility] Cell [phone]

## 2011-11-28 NOTE — Progress Notes (Signed)
[facility] Progress Note  SUBJECTIVE: feels, good. Going home at this minute.  OBJECTIVE:  Vital signs in last 24 hours: Temp:  [98.3 F (36.8 C)-98.7 F (37.1 C)] 98.3 F (36.8 C) (01/04 0441) Pulse Rate:  [79-83] 83  (01/04 0441) Resp:  [20] 20  (01/04 0441) BP: (118-134)/(71-81) 132/79 mmHg (01/04 0441) SpO2:  [95 %-97 %] 95 % (01/04 0441) Weight:  [108.818 kg (239 lb 14.4 oz)] 239 lb 14.4 oz (108.818 kg) (01/04 0315) Last BM Date: 11/28/11 General:    White male in NAD Heart:  Regular rate and rhythm Lungs: Respirations even and unlabored, lungs CTA bilaterally Abdomen:  Soft, nontender and nondistended. Normal bowel sounds. Neurologic:  Alert and oriented,  grossly normal neurologically. Psych:  Cooperative. Normal mood and affect.   Lab Results:  Basename 11/27/11 0530 11/26/11 0445 11/25/11 2120  WBC 3.9* 3.5* --  HGB 9.2* 9.5* 9.6*  HCT 27.6* 28.6* 28.7*  PLT 65* 68* --   BMET  Basename 11/27/11 0530 11/26/11 0445 11/25/11 1850  NA 138 137 137  K 4.0 4.5 4.6  CL 103 106 107  CO2 25 24 23   GLUCOSE 97 151* 201*  BUN 10 17 24*  CREATININE 1.00 0.99 0.97  CALCIUM 9.0 8.4 8.4    PT/INR  Basename 11/27/11 0530 11/25/11 1850  LABPROT 17.3* 17.8*  INR 1.39 1.44    ASSESSMENT / PLAN:  1. Variceal bleed, s/p EGD with banding 11/25/11. No further bleeding. Octreotide infusion discontinued today (had 72 hours of). Continue BID PPI .  He got 4 days of Rocephin (last dose of was this am). He needs 5 total days of antibiotics, he will take Cipro tomorrow then discontinue. Will need to followup with Dr. Jena Gauss in [place] after d/c for repeat EGD with banding until varices are fully eradicated (this will occur q3-4 weeks until varices gone). I discussed this with the patient. No heavy lifting / straining until seen by Dr. Jena Gauss.  2. 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 S/P EGD with banding yesterday. See #1. Low Na diet. Needs to avoid all ETOH  in the future, I reiterated this today  3. Anemia of acute blood loss, hemoglobin has remained stable since transfusion.     LOS: 4 days   Willette Cluster  11/28/2011, 10:26 AM

## 2011-11-28 NOTE — Progress Notes (Signed)
Patient discharge teaching given, including activity, diet, follow-up appoints, and medications. Patient verbalized understanding of all discharge instructions. IV access was d/c'd. Vitals are stable. Skin is intact except as charted in most recent assessments. Pt to be escorted out by NT, to be driven home by family. 

## 2011-11-28 NOTE — Progress Notes (Signed)
Per MD order, central line removed. IV cathter intact. Vaseline pressure gauze to site, pressure held x 5 min, no bleeding to site. Pt instructed to keep dressing CDI x 24hours, if bleeding occurs hold pressure, if bleeding does not stop contact MD or go to the ED. Pt verbalized understanding and did not have any questions Di Jasmer M  

## 2011-11-28 NOTE — Progress Notes (Signed)
Patient seen and I agree with the above documentation, including the assessment and plan.  

## 2011-11-28 NOTE — Progress Notes (Signed)
Name: Kyle [name] MRN: [id number] DOB: 12-27-1955  LOS: 4 PCCM PROGRESS  NOTE  History of Present Illness: 56 y/o male with cirrhosis from steatohepatitis admitted [facility] 1/1 after one day of hematemesis and bright red blood per rectum.  He is known to have esophageal varices from prior EGD's (no prior episodes of bleeding).  He was at his brother's one day pta  when he became suddenly nauseated with some cramping abdominal pain.  He then had two episodes of bright red emesis followed by hematochezia.  He went to the [facility] where he had another episode of both around 2100 on 11/24/11.  He was transiently hypotensive.  He was sent to our facility for further management am 1/1  Lines / Drains: 11/25/11 R IJ CVL >>1/4   Antibiotics: 11/25/11 ceftriaxone >> 1/4  Tests / Events: 1/1 EGD with portal gastropathy and esophageal varices s/p banding x 5.   Subjective:  Awake and appropriate, comfortable, sitting in chair nad, no distress.   Vital Signs:   Temp:  [98.3 F (36.8 C)-98.7 F (37.1 C)] 98.3 F (36.8 C) (01/04 0441) Pulse Rate:  [79-83] 83  (01/04 0441) Resp:  [20] 20  (01/04 0441) BP: (118-134)/(71-81) 132/79 mmHg (01/04 0441) SpO2:  [95 %-97 %] 95 % (01/04 0441) Weight:  [108.818 kg (239 lb 14.4 oz)] 239 lb 14.4 oz (108.818 kg) (01/04 0315)   Physical Examination: Gen:no acute distress HEENT: NCAT, PERRL, EOMi, OP clear, MMM, some dried blood on lips Neck: supple without masses PULM: CTA B CV: RRR, no mgr, no JVD AB: BS+, soft, nontender, no hsm Ext: warm, trace edema, no clubbing, no cyanosis Derm: no rash or skin breakdown Neuro: A&Ox4, CN II-XII intact, strength 5/5 in all 4 extremities Psyche: Normal mood and affect  Labs and Imaging:     Lab 11/27/11 0530 11/26/11 0445 11/25/11 1850  NA 138 137 137  K 4.0 4.5 4.6  CL 103 106 107  CO2 25 24 23   BUN 10 17 24*  CREATININE 1.00 0.99 0.97  GLUCOSE 97 151* 201*    Lab 11/27/11 0530 11/26/11 0445 11/25/11  2120 11/24/11 2340  HGB 9.2* 9.5* 9.6* --  HCT 27.6* 28.6* 28.7* --  WBC 3.9* 3.5* -- 10.0  PLT 65* 68* -- 124*    Assessment and Plan:  56 y/o male with steatohepatitis related cirrhosis and known esophageal varices presented with an acute brisk upper GI bleed leading to transient hypotension.  EGD = esophageal varices s/p bands 11/25/11  Upper GI bleed (11/25/2011)   Assessment: Appears to have stopped s/p banding  Plan:  - ppi bid -completed octreotide gtt x 72 hrs. Total so stop am 1/4   -will need repeat EGD in 2-4 weeks to insure bands in place, with Rourk.    ANEMIA-IRON DEFICIENCY (03/09/2009)  Lab 11/27/11 0530 11/26/11 0445 11/25/11 2120  HGB 9.2* 9.5* 9.6*    Assessment: UGI Bleed Plan: per G  HEPATIC CIRRHOSIS (05/21/2009) Lab Results  Component Value Date   ALT 37 11/24/2011   AST 38* 11/24/2011   ALKPHOS 64 11/24/2011   BILITOT 0.5 11/24/2011   Lab Results  Component Value Date   INR 1.39 11/27/2011   INR 1.44 11/25/2011   INR 1.51* 11/24/2011  Plan: Assessment: due to steatohepatitis, prior EtOH? Vs [name] with portal HTN.  Plan:  -complete 2 more d Cipro/ Per GI recs.   History of alcohol abuse (05/13/2011)   Assessment: says he is dry now  Plan:  -monitor for withdrawal  Hypotension (11/25/2011), resolved   Assessment: due to gi bleed   Plan:  -leave CVC in place for another day to insure no evidence recurrent GIB  DM  Plan: -restart half usual lantus until taking good PO: on 100 units qhs at home, start 50 units 11/26/11  Thrombocytopenia Lab Results  Component Value Date   PLT 65* 11/27/2011   PLT 68* 11/26/2011   PLT 124* 11/24/2011  ? Component hypersplenism, appears to be leveling off   Sandrea Hughs, MD Pulmonary and Critical Care Medicine [facility] Cell [phone]

## 2011-11-29 LAB — TYPE AND SCREEN
ABO/RH(D): O POS
Unit division: 0
Unit division: 0

## 2011-12-01 ENCOUNTER — Telehealth: Payer: Self-pay | Admitting: Internal Medicine

## 2011-12-01 ENCOUNTER — Telehealth (HOSPITAL_COMMUNITY): Payer: Self-pay | Admitting: Oncology

## 2011-12-01 NOTE — Telephone Encounter (Signed)
Pt aware, he said he still had some omeprazole from before and doesn't need an rx called in right now.

## 2011-12-01 NOTE — Telephone Encounter (Signed)
Stop Protonix; begin omeprazole 40 mg one daily. Dispense #30 with 11 refills

## 2011-12-01 NOTE — Telephone Encounter (Signed)
Pt is on January 2013 recall list to have repeat abd ultrasound in 6 months

## 2011-12-01 NOTE — Telephone Encounter (Signed)
Patient was taken off of omeprazole 40mg  1 daily and it was working well. He was released from the hospital Friday switched to Protonix 40mg  a day an he said this is not working as well hes having abd pain please advise??

## 2011-12-02 ENCOUNTER — Encounter: Payer: Self-pay | Admitting: Gastroenterology

## 2011-12-02 ENCOUNTER — Other Ambulatory Visit: Payer: Self-pay | Admitting: Gastroenterology

## 2011-12-02 DIAGNOSIS — K7460 Unspecified cirrhosis of liver: Secondary | ICD-10-CM

## 2011-12-02 NOTE — Telephone Encounter (Signed)
[place] scheduled for 01/14- Letter mailed

## 2011-12-05 ENCOUNTER — Encounter (HOSPITAL_COMMUNITY): Payer: Medicaid Other | Attending: Oncology

## 2011-12-05 DIAGNOSIS — D509 Iron deficiency anemia, unspecified: Secondary | ICD-10-CM | POA: Insufficient documentation

## 2011-12-05 DIAGNOSIS — D6959 Other secondary thrombocytopenia: Secondary | ICD-10-CM | POA: Insufficient documentation

## 2011-12-05 DIAGNOSIS — K922 Gastrointestinal hemorrhage, unspecified: Secondary | ICD-10-CM | POA: Insufficient documentation

## 2011-12-05 DIAGNOSIS — D649 Anemia, unspecified: Secondary | ICD-10-CM | POA: Insufficient documentation

## 2011-12-05 DIAGNOSIS — D6489 Other specified anemias: Secondary | ICD-10-CM

## 2011-12-05 DIAGNOSIS — C679 Malignant neoplasm of bladder, unspecified: Secondary | ICD-10-CM

## 2011-12-05 LAB — CBC
HCT: 31.4 % — ABNORMAL LOW (ref 39.0–52.0)
MCV: 91.3 fL (ref 78.0–100.0)
RBC: 3.44 MIL/uL — ABNORMAL LOW (ref 4.22–5.81)
WBC: 3.4 10*3/uL — ABNORMAL LOW (ref 4.0–10.5)

## 2011-12-05 NOTE — Progress Notes (Signed)
Labs drawn today for cbc,ferr 

## 2011-12-08 ENCOUNTER — Other Ambulatory Visit (HOSPITAL_COMMUNITY): Payer: Self-pay | Admitting: Oncology

## 2011-12-08 ENCOUNTER — Ambulatory Visit (HOSPITAL_COMMUNITY): Payer: Medicaid Other

## 2011-12-09 ENCOUNTER — Encounter (HOSPITAL_BASED_OUTPATIENT_CLINIC_OR_DEPARTMENT_OTHER): Payer: Medicaid Other

## 2011-12-09 VITALS — BP 124/77 | HR 83 | Temp 98.4°F

## 2011-12-09 DIAGNOSIS — D509 Iron deficiency anemia, unspecified: Secondary | ICD-10-CM

## 2011-12-09 MED ORDER — SODIUM CHLORIDE 0.9 % IV SOLN
1020.0000 mg | Freq: Once | INTRAVENOUS | Status: AC
  Administered 2011-12-09: 1020 mg via INTRAVENOUS
  Filled 2011-12-09: qty 34

## 2011-12-09 MED ORDER — SODIUM CHLORIDE 0.9 % IJ SOLN
10.0000 mL | INTRAMUSCULAR | Status: DC | PRN
  Administered 2011-12-09: 10 mL via INTRAVENOUS
  Filled 2011-12-09: qty 10

## 2011-12-09 MED ORDER — SODIUM CHLORIDE 0.9 % IV SOLN
INTRAVENOUS | Status: DC
  Administered 2011-12-09: 10:00:00 via INTRAVENOUS

## 2011-12-09 MED ORDER — SODIUM CHLORIDE 0.9 % IJ SOLN
INTRAMUSCULAR | Status: AC
  Filled 2011-12-09: qty 10

## 2011-12-09 NOTE — Progress Notes (Signed)
Tolerated fereheme infusion well. 

## 2011-12-18 ENCOUNTER — Ambulatory Visit (INDEPENDENT_AMBULATORY_CARE_PROVIDER_SITE_OTHER): Payer: Medicaid Other | Admitting: Gastroenterology

## 2011-12-18 ENCOUNTER — Encounter: Payer: Self-pay | Admitting: Gastroenterology

## 2011-12-18 VITALS — BP 119/76 | HR 80 | Temp 98.0°F | Ht 67.0 in | Wt 243.8 lb

## 2011-12-18 DIAGNOSIS — I8500 Esophageal varices without bleeding: Secondary | ICD-10-CM

## 2011-12-18 DIAGNOSIS — R197 Diarrhea, unspecified: Secondary | ICD-10-CM

## 2011-12-18 MED ORDER — DICYCLOMINE HCL 10 MG PO CAPS: 10.0000 mg | ORAL_CAPSULE | Freq: Four times a day (QID) | ORAL | Status: DC

## 2011-12-18 NOTE — Progress Notes (Signed)
Referring Provider: Redmond Baseman, * Primary Care Physician:  Redmond Baseman, MD, MD Primary Gastroenterologist: Dr. Jena Gauss   Chief Complaint  Patient presents with  . Follow-up    HPI:   Kyle [name] returns today in hospital follow-up after recent variceal bleed requiring transfer to [facility] due to lack of beds at [facility] over the New Years holiday. Hx of [name] cirrhosis, IDA, was up to date on variceal surveillance, which had been last done by [place] in Jan 2012. Has had difficulty obtaining PCP care and switching from Atenolol to non-selective beta-blocker. I had attempted in the past to make contact with prior PCP, but pt changed offices. Wrote letter to Dr. Modesto Charon to ensure continuity of care; pt states he was notified by their office and switched to Inderal.  Inpatient at [facility] from 12/31 to 1/4 with emergent EGD at bedside by Dr. Rhea Belton, findings of Grade III esophageal varices s/p banding X 5. Recommended repeat EGD in 2-4 weeks to assess banding and need for further intervention. Denies any reflux, N/V, further hematemesis.   Does report postprandial urgency, loose stools. No rectal bleeding.     Past Medical History  Diagnosis Date  . DM (diabetes mellitus)   . Cirrhosis     bx proven steatohepatitis with cirrhosis (2010); per note from Feb 2011, received Hep A and B vaccines in 2010  . HTN (hypertension)   . RLS (restless legs syndrome)   . Sleep apnea   . Hyperlipidemia   . IDA (iron deficiency anemia)   . GERD (gastroesophageal reflux disease)   . Depression   . Peripheral neuropathy   . Urothelial cancer     2010, paillary low-grade, h/o recurrence 2011  . B12 deficiency   . Psoriasis   . 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 Anemia due to multiple mechanisms 05/13/2011  . History of alcohol abuse 05/13/2011  . ANEMIA-IRON DEFICIENCY 03/09/2009  . S/P endoscopy May 2012    4 columns grade II esophageal varices; due for repeat in Nov 2013   . S/P  colonoscopy May 2012    Tubular adenoma  . Low back pain   . Cancer     bladder ca 05/2009 removal and  w/chemo wash  . Esophageal varices with bleeding 11/24/11    s/p emergent EGD 11/25/11 by Dr. Rhea Belton at [facility], Grade III esophageal varices s/p banding X 5    Past Surgical History  Procedure Date  . Carpel tunnel   . Shoulder surgery   . Adrenal mass surgery 05/2009    benign, left  . Bladder surgery 01/2009 and 06/2010    cancer 2010, small recurrence in 06/2010  . Colonoscopy 04/2009    moderate int hemorrhoids, rare sigmoid diverticula, one mm sessile hyperplastic rectal polyp  . Esophagogastroduodenoscopy 03/2009    small hh  . Small bowel capsule endoscopy 03/2009    couple of small benign appearing erosions, nonbleeding  . Egd/tcs 08/2007    small hiatal hernia, pancolonic diverticula, friable anal canal, 3cm salmon colored epithelium in distal esophagus, bx negative for Barrett's  . Skin cancer excision Oct 2012    left arm  . Esophagogastroduodenoscopy 11/25/2011    Procedure: ESOPHAGOGASTRODUODENOSCOPY (EGD);  Surgeon: Erick Blinks, MD;  Location: [facility] ENDOSCOPY;  Service: Gastroenterology;  Laterality: Left;    Current Outpatient Prescriptions  Medication Sig Dispense Refill  . fish oil-omega-3 fatty acids 1000 MG capsule Take 1 g by mouth 3 (three) times daily.       [name]  furosemide (LASIX) 20 MG tablet Take 20 mg by mouth daily.       [name] gabapentin (NEURONTIN) 300 MG capsule Take 1,200 mg by mouth 2 (two) times daily.       . hydrochlorothiazide (MICROZIDE) 12.5 MG capsule Take 12.5 mg by mouth daily.      . insulin aspart (NOVOLOG) 100 UNIT/ML injection Inject 20 Units into the skin 2 (two) times daily. Sliding Scale      . insulin glargine (LANTUS) 100 UNIT/ML injection Inject 100 Units into the skin at bedtime.       [name] lisinopril (PRINIVIL,ZESTRIL) 20 MG tablet Take 10 mg by mouth daily.       . metFORMIN (GLUCOPHAGE) 1000 MG tablet Take 1,000 mg by mouth 2 (two) times daily with  a meal.        . Multiple Vitamin (MULTIVITAMIN) capsule Take 1 capsule by mouth daily.        [name] omeprazole (PRILOSEC) 40 MG capsule Take 40 mg by mouth daily.      . pantoprazole (PROTONIX) 40 MG tablet Take 1 tablet (40 mg total) by mouth 2 (two) times daily before a meal.  60 tablet  3  . pramipexole (MIRAPEX) 0.5 MG tablet Take 0.5 mg by mouth at bedtime.       [name] Propranolol HCl (INDERAL LA PO) Take 40 mg by mouth 2 (two) times daily.      . simvastatin (ZOCOR) 20 MG tablet Take 20 mg by mouth every evening.      . traZODone (DESYREL) 50 MG tablet Take 50 mg by mouth at bedtime.      . dicyclomine (BENTYL) 10 MG capsule Take 1 capsule (10 mg total) by mouth 4 (four) times daily.  120 capsule  0    Allergies as of 12/18/2011 - Review Complete 12/18/2011  Allergen Reaction Noted  . Tylenol (acetaminophen) Other (See Comments) 07/17/2011    Family History  Problem Relation Age of Onset  . Cirrhosis Father     etoh  . Colon cancer Neg Hx   . Kidney cancer Mother   . Cancer Mother   . HIV Brother   . Cirrhosis Brother     nash    History   Social History  . Marital Status: Married    Spouse Name: N/A    Number of Children: 3  . Years of Education: N/A   Occupational History  . disabled    Social History Main Topics  . Smoking status: Current Everyday Smoker -- 1.0 packs/day    Types: Cigarettes  . Smokeless tobacco: None  . Alcohol Use: No     drank heavily for few years in 20s  . Drug Use: No  . Sexually Active: None   Other Topics Concern  . None   Social History Narrative  . None    Review of Systems: Gen: Denies fever, chills, anorexia. Denies fatigue, weakness, weight loss.  CV: Denies chest pain, palpitations, syncope, peripheral edema, and claudication. Resp: Denies dyspnea at rest, cough, wheezing, coughing up blood, and pleurisy. GI: Denies vomiting blood, jaundice, and fecal incontinence.   Denies dysphagia or odynophagia. Derm: Denies rash,  itching, dry skin Psych: Denies depression, anxiety, memory loss, confusion. No homicidal or suicidal ideation.  Heme: Denies bruising, bleeding, and enlarged lymph nodes.  Physical Exam: BP 119/76  Pulse 80  Temp(Src) 98 F (36.7 C) (Temporal)  Ht 5\' 7"  (1.702 m)  Wt 243 lb 12.8 oz (110.587 kg)  BMI 38.18  kg/m2 General:   Alert and oriented. No distress noted. Pleasant and cooperative.  Head:  Normocephalic and atraumatic. Eyes:  Conjuctiva clear without scleral icterus. Mouth:  Oral mucosa pink and moist. Good dentition. No lesions. Neck:  Supple, without mass or thyromegaly. Heart:  S1, S2 present without murmurs, rubs, or gallops. Regular rate and rhythm. Abdomen:  +BS, soft, non-tender and non-distended. No rebound or guarding. No HSM or masses noted. Possible umbilical hernia.  Msk:  Symmetrical without gross deformities. Normal posture. Extremities:  Without edema. Neurologic:  Alert and  oriented x4;  grossly normal neurologically. Skin:  Intact without significant lesions or rashes. Cervical Nodes:  No significant cervical adenopathy. Psych:  Alert and cooperative. Normal mood and affect.

## 2011-12-18 NOTE — Patient Instructions (Signed)
Please complete the stool sample and return to our office. We will call you with these results.  In the meantime, you may start taking Bentyl before meals and at bedtime. Try to avoid fatty foods and follow a high fiber diet.   We haves set you up for a repeat endoscopy with Dr. Jena Gauss to assess the known esophageal varices. Further recommendations to follow once this is completed.

## 2011-12-19 ENCOUNTER — Encounter: Payer: Self-pay | Admitting: Gastroenterology

## 2011-12-19 DIAGNOSIS — I8500 Esophageal varices without bleeding: Secondary | ICD-10-CM | POA: Insufficient documentation

## 2011-12-19 DIAGNOSIS — R197 Diarrhea, unspecified: Secondary | ICD-10-CM | POA: Insufficient documentation

## 2011-12-19 NOTE — Assessment & Plan Note (Signed)
Postprandial urgency noted, loose stools after each meal. Question IBS. Will obtain Cdiff PCR due to recent hospitalization, exposure to abx. TCS up-to-date at this time.  Cdiff PCR Bentyl Further recommendations to follow

## 2011-12-19 NOTE — Assessment & Plan Note (Signed)
56 year old male with known esophageal varices, last EGD by [place] up-to-date in Jan 2012, presenting with acute esophageal variceal bleed on 12/31 to [facility] and transferred to [facility] due to lack of beds. Emergent bedside EGD done by Dr. Rhea Belton with Grade III varices s/p banding X 5 on 1/1. Recommendations for repeat EGD in 2-4 weeks for reassessment of banding, need for further intervention. Pt stable, no further signs of acute GI bleeding. On Inderal for prophylaxis.  Proceed with upper endoscopy in the near future with Dr. Jena Gauss. The risks, benefits, and alternatives have been discussed in detail with patient. They have stated understanding and desire to proceed.  Continue Inderal Continue Protonix BID

## 2011-12-22 LAB — CLOSTRIDIUM DIFFICILE BY PCR: Toxigenic C. Difficile by PCR: NOT DETECTED

## 2011-12-22 NOTE — Progress Notes (Signed)
Quick Note:  Negative. Continue with EGD for varices as planned. ______

## 2011-12-22 NOTE — Progress Notes (Signed)
Faxed to PCP

## 2012-01-02 ENCOUNTER — Encounter (HOSPITAL_COMMUNITY): Payer: Medicaid Other | Attending: Oncology

## 2012-01-02 DIAGNOSIS — D6489 Other specified anemias: Secondary | ICD-10-CM

## 2012-01-02 DIAGNOSIS — D6959 Other secondary thrombocytopenia: Secondary | ICD-10-CM | POA: Insufficient documentation

## 2012-01-02 DIAGNOSIS — K7460 Unspecified cirrhosis of liver: Secondary | ICD-10-CM | POA: Insufficient documentation

## 2012-01-02 DIAGNOSIS — D649 Anemia, unspecified: Secondary | ICD-10-CM | POA: Insufficient documentation

## 2012-01-02 DIAGNOSIS — K922 Gastrointestinal hemorrhage, unspecified: Secondary | ICD-10-CM | POA: Insufficient documentation

## 2012-01-02 DIAGNOSIS — D509 Iron deficiency anemia, unspecified: Secondary | ICD-10-CM

## 2012-01-02 DIAGNOSIS — D518 Other vitamin B12 deficiency anemias: Secondary | ICD-10-CM | POA: Insufficient documentation

## 2012-01-02 DIAGNOSIS — K7689 Other specified diseases of liver: Secondary | ICD-10-CM | POA: Insufficient documentation

## 2012-01-02 LAB — DIFFERENTIAL
Basophils Absolute: 0 10*3/uL (ref 0.0–0.1)
Basophils Relative: 0 % (ref 0–1)
Eosinophils Absolute: 0.1 10*3/uL (ref 0.0–0.7)
Neutro Abs: 2.6 10*3/uL (ref 1.7–7.7)
Neutrophils Relative %: 60 % (ref 43–77)

## 2012-01-02 LAB — CBC
HCT: 38.3 % — ABNORMAL LOW (ref 39.0–52.0)
Hemoglobin: 12 g/dL — ABNORMAL LOW (ref 13.0–17.0)
MCV: 95 fL (ref 78.0–100.0)
RDW: 16.3 % — ABNORMAL HIGH (ref 11.5–15.5)
WBC: 4.4 10*3/uL (ref 4.0–10.5)

## 2012-01-02 LAB — COMPREHENSIVE METABOLIC PANEL
Albumin: 3.4 g/dL — ABNORMAL LOW (ref 3.5–5.2)
Alkaline Phosphatase: 63 U/L (ref 39–117)
BUN: 17 mg/dL (ref 6–23)
CO2: 24 mEq/L (ref 19–32)
Chloride: 101 mEq/L (ref 96–112)
Creatinine, Ser: 1.09 mg/dL (ref 0.50–1.35)
GFR calc Af Amer: 87 mL/min — ABNORMAL LOW (ref >90)
GFR calc non Af Amer: 75 mL/min — ABNORMAL LOW (ref >90)
Glucose, Bld: 232 mg/dL — ABNORMAL HIGH (ref 70–99)
Potassium: 5.1 mEq/L (ref 3.5–5.1)
Total Bilirubin: 0.6 mg/dL (ref 0.3–1.2)

## 2012-01-02 LAB — VITAMIN B12: Vitamin B-12: 190 pg/mL — ABNORMAL LOW (ref 211–911)

## 2012-01-02 NOTE — Progress Notes (Signed)
Labs drawn today for cbc/diff,cmp,ferr, b12

## 2012-01-09 ENCOUNTER — Encounter (HOSPITAL_BASED_OUTPATIENT_CLINIC_OR_DEPARTMENT_OTHER): Payer: Medicaid Other | Admitting: Oncology

## 2012-01-09 ENCOUNTER — Other Ambulatory Visit (HOSPITAL_COMMUNITY): Payer: Self-pay | Admitting: Oncology

## 2012-01-09 DIAGNOSIS — D6959 Other secondary thrombocytopenia: Secondary | ICD-10-CM

## 2012-01-09 DIAGNOSIS — C679 Malignant neoplasm of bladder, unspecified: Secondary | ICD-10-CM

## 2012-01-09 DIAGNOSIS — E278 Other specified disorders of adrenal gland: Secondary | ICD-10-CM

## 2012-01-09 DIAGNOSIS — D509 Iron deficiency anemia, unspecified: Secondary | ICD-10-CM

## 2012-01-09 NOTE — Progress Notes (Signed)
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 Iron deficiency anemia   3. ADRENAL MASS     CURRENT THERAPY:Intermittent Feraheme infusions as needed   INTERVAL HISTORY: Kyle [name] 56 y.o. male returns for  regular  visit for followup of  anemia of multiple mechanisms   The patient was in the hospital in December and January for esophageal varice bleeding.  He reports that he was spitting up a large amount of blood.  [facility] presented to [facility] transported [facility] to [facility].  At the Hospital, the patient told me that he was in ICU for 3 days, he received 2 units of PRBCs, and underwent some variceal banding.   He is due top undergo an EGD by Dr. Jena Gauss to further evaluate these in the near future.   Otherwise, the patient continues to have his chronic complaints of restless leg syndrome, insomnia, and decreased energy.  He continues to smoke.   He did see Dr. Fransico Him in the past for help with his DM control.    I personally reviewed and went over laboratory results with the patient.  His Hgb has risen by nearly 2 grams and his Ferritin rose from 27 to 55.   Past Medical History  Diagnosis Date  . DM (diabetes mellitus)   . Cirrhosis     bx proven steatohepatitis with cirrhosis (2010); per note from Feb 2011, received Hep A and B vaccines in 2010  . HTN (hypertension)   . RLS (restless legs syndrome)   . Sleep apnea   . Hyperlipidemia   . IDA (iron deficiency anemia)   . GERD (gastroesophageal reflux disease)   . Depression   . Peripheral neuropathy   . Urothelial cancer     2010, paillary low-grade, h/o recurrence 2011  . B12 deficiency   . Psoriasis   . 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 Anemia due to multiple mechanisms 05/13/2011  . History of alcohol abuse 05/13/2011  . ANEMIA-IRON DEFICIENCY 03/09/2009  . S/P endoscopy May 2012    4 columns grade II esophageal varices; due for  repeat in Nov 2013   . S/P colonoscopy May 2012    Tubular adenoma  . Low back pain   . Cancer     bladder ca 05/2009 removal and  w/chemo wash  . Esophageal varices with bleeding 11/24/11    s/p emergent EGD 11/25/11 by Dr. Rhea Belton at [facility], Grade III esophageal varices s/p banding X 5    has BLADDER CANCER; DIABETES MELLITUS, TYPE II, CONTROLLED, WITH COMPLICATIONS; ADRENAL MASS; HYPERLIPIDEMIA; ANEMIA-IRON DEFICIENCY; ANEMIA, VITAMIN B12 DEFICIENCY; SMOKER; DEPRESSION; SLEEP APNEA, OBSTRUCTIVE, MODERATE; RESTLESS LEG SYNDROME; HYPERTENSION; GERD; HEPATIC CIRRHOSIS; OTHER CHRONIC NONALCOHOLIC LIVER DISEASE; HEMOCCULT POSITIVE STOOL; GI BLEEDING; ARTHRITIS; SHOULDER PAIN, LEFT; INSOMNIA; FATIGUE; WEIGHT GAIN; CHEST DISCOMFORT; PROTEINURIA; ABNORMAL ELECTROCARDIOGRAM; Thrombocytopenia due to hypersplenism; Anemia due to multiple mechanisms; History of alcohol abuse; Iron deficiency anemia; Abdominal pain; External hemorrhoid; Upper GI bleed; Hypotension; Diarrhea; and Esophageal varices on his problem list.     is allergic to tylenol.  Mr. Knee does not currently have medications on file.  Past Surgical History  Procedure Date  . Carpel tunnel   . Shoulder surgery   . Adrenal mass surgery 05/2009    benign, left  . Bladder surgery 01/2009 and 06/2010    cancer 2010, small recurrence in 06/2010  . Colonoscopy 04/2009    moderate int hemorrhoids,  rare sigmoid diverticula, one mm sessile hyperplastic rectal polyp  . Esophagogastroduodenoscopy 03/2009    small hh  . Small bowel capsule endoscopy 03/2009    couple of small benign appearing erosions, nonbleeding  . Egd/tcs 08/2007    small hiatal hernia, pancolonic diverticula, friable anal canal, 3cm salmon colored epithelium in distal esophagus, bx negative for Barrett's  . Skin cancer excision Oct 2012    left arm  . Esophagogastroduodenoscopy 11/25/2011    Procedure: ESOPHAGOGASTRODUODENOSCOPY (EGD);  Surgeon: Erick Blinks, MD;  Location: [facility]  ENDOSCOPY;  Service: Gastroenterology;  Laterality: Left;    Denies any headaches, dizziness, double vision, fevers, chills, night sweats, nausea, vomiting, diarrhea, constipation, chest pain, heart palpitations, shortness of breath, blood in stool, black tarry stool, urinary pain, urinary burning, urinary frequency, hematuria.   PHYSICAL EXAMINATION  ECOG PERFORMANCE STATUS: 1 - Symptomatic but completely ambulatory  Filed Vitals:   01/09/12 1113  BP: 114/78  Pulse: 76  Temp: 98 F (36.7 C)    GENERAL:alert, well nourished, well developed, comfortable, cooperative and obese SKIN: skin color, texture, turgor are normal, no rashes or significant lesions HEAD: Normocephalic, No masses, lesions, tenderness or abnormalities EYES: normal EARS: External ears normal OROPHARYNX:lips, buccal mucosa, and tongue normal and mucous membranes are moist  NECK: supple, trachea midline LYMPH:  no palpable lymphadenopathy BREAST:not examined LUNGS: clear to auscultation and percussion, decreaed breath sounds HEART: regular rate & rhythm, no murmurs, no gallops, S1 normal and S2 normal ABDOMEN:abdomen soft, obese and normal bowel sounds BACK: Back symmetric, no curvature., No CVA tenderness EXTREMITIES:less then 2 second capillary refill, no joint deformities, effusion, or inflammation, no edema, no cyanosis  NEURO: alert & oriented x 3 with fluent speech, no focal motor/sensory deficits, gait normal   LABORATORY DATA: CBC    Component Value Date/Time   WBC 4.4 01/02/2012 0923   RBC 4.03* 01/02/2012 0923   HGB 12.0* 01/02/2012 0923   HCT 38.3* 01/02/2012 0923   PLT 78* 01/02/2012 0923   MCV 95.0 01/02/2012 0923   MCH 29.8 01/02/2012 0923   MCHC 31.3 01/02/2012 0923   RDW 16.3* 01/02/2012 0923   LYMPHSABS 1.3 01/02/2012 0923   MONOABS 0.4 01/02/2012 0923   EOSABS 0.1 01/02/2012 0923   BASOSABS 0.0 01/02/2012 0923      Chemistry      Component Value Date/Time   NA 136 01/02/2012 0923   K 5.1 01/02/2012 0923    CL 101 01/02/2012 0923   CO2 24 01/02/2012 0923   BUN 17 01/02/2012 0923   CREATININE 1.09 01/02/2012 0923   CREATININE 1.03 11/11/2011 1335      Component Value Date/Time   CALCIUM 9.7 01/02/2012 0923   ALKPHOS 63 01/02/2012 0923   AST 52* 01/02/2012 0923   ALT 47 01/02/2012 0923   BILITOT 0.6 01/02/2012 0923     Lab Results  Component Value Date   IRON 23* 12/18/2010   TIBC 427 12/18/2010   FERRITIN 55 01/02/2012      ASSESSMENT:  1. Multifactorial anemia  2. Iron deficiency anemia  3. Liver disease with fatty infiltration  4. Obesity  5. Probably lung disease secondary to longstanding smoking history.  6. DM   PLAN:  1. Feraheme 1020 mg next week of the week after. 2. Lab work in 8 weeks: CBC, Ferrtin 3. Lab work in 16 weeks: CBC diff, CMET, Ferrtin 4. I personally reviewed and went over laboratory results with the patient. 5. Return in 16-18 weeks for follow-up.  All questions were answered. The patient knows to call the clinic with any problems, questions or concerns. We can certainly see the patient much sooner if necessary.  The patient and plan discussed with Glenford Peers, MD and he is in agreement with the aforementioned.   Natavia Sublette

## 2012-01-09 NOTE — Patient Instructions (Signed)
[facility]  Discharge Instructions  RECOMMENDATIONS MADE BY THE CONSULTANT AND ANY TEST RESULTS WILL BE SENT TO YOUR REFERRING DOCTOR.   We will get you scheduled for an iron infusion in the next week or so. We will have you come back in 8 weeks and 16 weeks for lab work. You will see the doctor again after your 16 week lab appointment.   I acknowledge that I have been informed and understand all the instructions given to me and received a copy. I do not have any more questions at this time, but understand that I may call the Specialty Clinic at [facility] at [phone] during business hours should I have any further questions or need assistance in obtaining follow-up care.    __________________________________________  _____________  __________ Signature of Patient or Authorized Representative            Date                   Time    __________________________________________ Nurse's Signature

## 2012-01-12 ENCOUNTER — Encounter (HOSPITAL_BASED_OUTPATIENT_CLINIC_OR_DEPARTMENT_OTHER): Payer: Medicaid Other

## 2012-01-12 VITALS — BP 113/73 | HR 75 | Temp 97.7°F

## 2012-01-12 DIAGNOSIS — D509 Iron deficiency anemia, unspecified: Secondary | ICD-10-CM

## 2012-01-12 MED ORDER — SODIUM CHLORIDE 0.9 % IV SOLN
Freq: Once | INTRAVENOUS | Status: AC
  Administered 2012-01-12: 12:00:00 via INTRAVENOUS

## 2012-01-12 MED ORDER — SODIUM CHLORIDE 0.9 % IJ SOLN
INTRAMUSCULAR | Status: AC
  Administered 2012-01-12: 10 mL
  Filled 2012-01-12: qty 10

## 2012-01-12 MED ORDER — SODIUM CHLORIDE 0.9 % IJ SOLN
10.0000 mL | INTRAMUSCULAR | Status: DC | PRN
  Administered 2012-01-12: 10 mL
  Filled 2012-01-12: qty 10

## 2012-01-12 MED ORDER — SODIUM CHLORIDE 0.9 % IV SOLN
1020.0000 mg | Freq: Once | INTRAVENOUS | Status: AC
  Administered 2012-01-12: 1020 mg via INTRAVENOUS
  Filled 2012-01-12: qty 34

## 2012-01-12 NOTE — Progress Notes (Signed)
Tolerated infusion well. 

## 2012-01-13 MED ORDER — SODIUM CHLORIDE 0.45 % IV SOLN
Freq: Once | INTRAVENOUS | Status: AC
  Administered 2012-01-14: 07:00:00 via INTRAVENOUS

## 2012-01-14 ENCOUNTER — Ambulatory Visit (HOSPITAL_COMMUNITY)
Admission: RE | Admit: 2012-01-14 | Discharge: 2012-01-14 | Disposition: A | Discharge status: Home / Self Care | Payer: Medicaid Other | Source: Ambulatory Visit | Attending: Internal Medicine | Admitting: Internal Medicine

## 2012-01-14 ENCOUNTER — Encounter (HOSPITAL_COMMUNITY): Payer: Self-pay

## 2012-01-14 ENCOUNTER — Encounter (HOSPITAL_COMMUNITY)
Admission: RE | Disposition: A | Discharge status: Home / Self Care | Payer: Self-pay | Source: Ambulatory Visit | Attending: Internal Medicine

## 2012-01-14 DIAGNOSIS — Z79899 Other long term (current) drug therapy: Secondary | ICD-10-CM | POA: Insufficient documentation

## 2012-01-14 DIAGNOSIS — I8500 Esophageal varices without bleeding: Secondary | ICD-10-CM | POA: Insufficient documentation

## 2012-01-14 DIAGNOSIS — K7689 Other specified diseases of liver: Secondary | ICD-10-CM | POA: Insufficient documentation

## 2012-01-14 DIAGNOSIS — E119 Type 2 diabetes mellitus without complications: Secondary | ICD-10-CM | POA: Insufficient documentation

## 2012-01-14 DIAGNOSIS — K319 Disease of stomach and duodenum, unspecified: Secondary | ICD-10-CM

## 2012-01-14 DIAGNOSIS — K449 Diaphragmatic hernia without obstruction or gangrene: Secondary | ICD-10-CM | POA: Insufficient documentation

## 2012-01-14 DIAGNOSIS — R197 Diarrhea, unspecified: Secondary | ICD-10-CM

## 2012-01-14 DIAGNOSIS — I10 Essential (primary) hypertension: Secondary | ICD-10-CM | POA: Insufficient documentation

## 2012-01-14 HISTORY — PX: ESOPHAGOGASTRODUODENOSCOPY: SHX5428

## 2012-01-14 SURGERY — EGD (ESOPHAGOGASTRODUODENOSCOPY)
Anesthesia: Moderate Sedation

## 2012-01-14 MED ORDER — MIDAZOLAM HCL 5 MG/5ML IJ SOLN
INTRAMUSCULAR | Status: DC | PRN
  Administered 2012-01-14 (×2): 2 mg via INTRAVENOUS

## 2012-01-14 MED ORDER — STERILE WATER FOR IRRIGATION IR SOLN
Status: DC | PRN
  Administered 2012-01-14: 08:00:00

## 2012-01-14 MED ORDER — MIDAZOLAM HCL 5 MG/5ML IJ SOLN
INTRAMUSCULAR | Status: AC
  Filled 2012-01-14: qty 10

## 2012-01-14 MED ORDER — MEPERIDINE HCL 100 MG/ML IJ SOLN
INTRAMUSCULAR | Status: DC | PRN
  Administered 2012-01-14: 25 mg via INTRAVENOUS
  Administered 2012-01-14: 50 mg via INTRAVENOUS

## 2012-01-14 MED ORDER — MEPERIDINE HCL 100 MG/ML IJ SOLN
INTRAMUSCULAR | Status: AC
  Filled 2012-01-14: qty 2

## 2012-01-14 MED ORDER — BUTAMBEN-TETRACAINE-BENZOCAINE 2-2-14 % EX AERO
INHALATION_SPRAY | CUTANEOUS | Status: DC | PRN
  Administered 2012-01-14: 2 via TOPICAL

## 2012-01-14 NOTE — Interval H&P Note (Signed)
History and Physical Interval Note:  01/14/2012 7:46 AM  Kyle [name]  has presented today for surgery, with the diagnosis of varices  The various methods of treatment have been discussed with the patient and family. After consideration of risks, benefits and other options for treatment, the patient has consented to  Procedure(s) (LRB): ESOPHAGOGASTRODUODENOSCOPY (EGD) (N/A) as a surgical intervention .  The patients' history has been reviewed, patient examined, no change in status, stable for surgery.  I have reviewed the patients' chart and labs.  Questions were answered to the patient's satisfaction.     [name]

## 2012-01-14 NOTE — Op Note (Signed)
[facility] [address] [place], [place]  [address]  ENDOSCOPY PROCEDURE REPORT  PATIENT:  Kyle [name], Kyle [name]  MR#:  [id number] BIRTHDATE:  [birth year], 55 yrs. old  GENDER:  male  ENDOSCOPIST:  R. Roetta Sessions, MD Caleen Essex Referred by:  Leodis Sias, M.D.  PROCEDURE DATE:  01/14/2012 PROCEDURE:  EGD with esophageal band ligation  INDICATIONS:   history of esophageal varices; recent variceal bleed treated with EBL on January 1 of this year.  INFORMED CONSENT:   The risks, benefits, limitations, alternatives and imponderables have been discussed.  The potential for biopsy, esophogeal dilation, etc. have also been reviewed.  Questions have been answered.  All parties agreeable.  Please see the history and physical in the medical record for more information.  MEDICATIONS:     Versed 4 mg IV and Demerol 75 mg IV in divided doses. Cetacaine spray.  DESCRIPTION OF PROCEDURE:   The EG-[id number] ([id number]) and EG-2990i ([id number]) endoscope was introduced through the mouth and advanced to the second portion of the duodenum without difficulty or limitations.  The mucosal surfaces were surveyed very carefully during advancement of the scope and upon withdrawal.  Retroflexion view of the proximal stomach and esophagogastric junction was performed.  <<PROCEDUREIMAGES>>  FINDINGS:  4-5 columns of grade 2 esophageal varices; distal esophagus with one area of healing ulcer noted. No bleeding stigmata. Stomach: Empty; Small hilar hernia. Portable Gastropathy. Antral erosions. Normal first and second portion of the     duodenum  THERAPEUTIC / DIAGNOSTIC MANEUVERS PERFORMED:  Microvasive 7 shot bander loaded on to the scope. Scope reintroduced into the esophagus. A total of 6 bands were placed with good hemostasis being maintained and without apparent complication.  COMPLICATIONS:   None  IMPRESSION:     Esophageal varices --- persisting. Status post esophageal band ligation as  described above. Hiatal hernia. Poor gastropathy.       Antral erosions.  RECOMMENDATIONS:   Continue Inderal. Office visit with [place] in 6 weeks. Consider followup EGD in 8-10 weeks from now.  ______________________________ R. Roetta Sessions, MD Caleen Essex  CC:  n. eSIGNED:   R. Roetta Sessions at 01/14/2012 08:24 AM  Kyle [name], [phone]

## 2012-01-14 NOTE — Discharge Instructions (Signed)
EGD Discharge instructions Please read the instructions outlined below and refer to this sheet in the next few weeks. These discharge instructions provide you with general information on caring for yourself after you leave the hospital. Your doctor may also give you specific instructions. While your treatment has been planned according to the most current medical practices available, unavoidable complications occasionally occur. If you have any problems or questions after discharge, please call your doctor. ACTIVITY  You may resume your regular activity but move at a slower pace for the next 24 hours.   Take frequent rest periods for the next 24 hours.   Walking will help expel (get rid of) the air and reduce the bloated feeling in your abdomen.   No driving for 24 hours (because of the anesthesia (medicine) used during the test).   You may shower.   Do not sign any important legal documents or operate any machinery for 24 hours (because of the anesthesia used during the test).  NUTRITION  Drink plenty of fluids.   You may resume your normal diet.   Begin with a light meal and progress to your normal diet.   Avoid alcoholic beverages for 24 hours or as instructed by your caregiver.  MEDICATIONS  You may resume your normal medications unless your caregiver tells you otherwise.  WHAT YOU CAN EXPECT TODAY  You may experience abdominal discomfort such as a feeling of fullness or "gas" pains.  FOLLOW-UP  Your doctor will discuss the results of your test with you.  SEEK IMMEDIATE MEDICAL ATTENTION IF ANY OF THE FOLLOWING OCCUR:  Excessive nausea (feeling sick to your stomach) and/or vomiting.   Severe abdominal pain and distention (swelling).   Trouble swallowing.   Temperature over 101 F (37.8 C).   Rectal bleeding or vomiting of blood.   Clear liquid diet through lunch; then advanced to a soft diet later today and as tolerated beginning tomorrow  Continue  Inderal.  Office visit with [place] in 6 weeks. Antibiotic-Associated Diarrhea You or your child's diarrhea is due to taking an antibiotic medicine. This is a common reaction to these drugs. It does not mean you are allergic to the medicine. The diarrhea is usually not severe. The stools will return to normal several days after completing the antibiotic treatment. If a diaper rash occurs, you can wash the irritated area carefully. Then apply a cream or an ointment to protect the skin. Normally it is best to complete the antibiotic treatment. To reduce symptoms avoid:  Apple and grape fruit juices.   Dairy products.   Beans.  Encourage plenty of clear fluids, such as water or sports drinks. Cultured dairy products such as yogurt may help restore normal intestinal bacteria. Medicines to control the diarrhea may help reduce symptoms. But these should not be used if the stool is bloody.  SEEK IMMEDIATE MEDICAL CARE IF:   Diarrhea does not improve after completing the antibiotic medicine.   You notice a fever, bloody stools, increased pain, or vomiting.  Document Released: 12/18/2004 Document Revised: 07/23/2011 Document Reviewed: 06/15/2008 [facility] Patient Information 2012 [place], [place].

## 2012-01-14 NOTE — H&P (View-Only) (Signed)
Referring Provider: Wong, Francis Patrick, * Primary Care Physician:  WONG,FRANCIS PATRICK, MD, MD Primary Gastroenterologist: Dr. Rourk   Chief Complaint  Patient presents with  . Follow-up    HPI:   Mr. Kyle [name] returns today in hospital follow-up after recent variceal bleed requiring transfer to [facility] due to lack of beds at [facility] over the New Years holiday. Hx of [name] cirrhosis, IDA, was up to date on variceal surveillance, which had been last done by us in Jan 2012. Has had difficulty obtaining PCP care and switching from Atenolol to non-selective beta-blocker. I had attempted in the past to make contact with prior PCP, but pt changed offices. Wrote letter to Dr. Wong to ensure continuity of care; pt states he was notified by their office and switched to Inderal.  Inpatient at [facility] from 12/31 to 1/4 with emergent EGD at bedside by Dr. Pyrtle, findings of Grade III esophageal varices s/p banding X 5. Recommended repeat EGD in 2-4 weeks to assess banding and need for further intervention. Denies any reflux, N/V, further hematemesis.   Does report postprandial urgency, loose stools. No rectal bleeding.     Past Medical History  Diagnosis Date  . DM (diabetes mellitus)   . Cirrhosis     bx proven steatohepatitis with cirrhosis (2010); per note from Feb 2011, received Hep A and B vaccines in 2010  . HTN (hypertension)   . RLS (restless legs syndrome)   . Sleep apnea   . Hyperlipidemia   . IDA (iron deficiency anemia)   . GERD (gastroesophageal reflux disease)   . Depression   . Peripheral neuropathy   . Urothelial cancer     2010, paillary low-grade, h/o recurrence 2011  . B12 deficiency   . Psoriasis   . 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 Anemia due to multiple mechanisms 05/13/2011  . History of alcohol abuse 05/13/2011  . ANEMIA-IRON DEFICIENCY 03/09/2009  . S/P endoscopy May 2012    4 columns grade II esophageal varices; due for repeat in Nov 2013   . S/P  colonoscopy May 2012    Tubular adenoma  . Low back pain   . Cancer     bladder ca 05/2009 removal and  w/chemo wash  . Esophageal varices with bleeding 11/24/11    s/p emergent EGD 11/25/11 by Dr. Pyrtle at [facility], Grade III esophageal varices s/p banding X 5    Past Surgical History  Procedure Date  . Carpel tunnel   . Shoulder surgery   . Adrenal mass surgery 05/2009    benign, left  . Bladder surgery 01/2009 and 06/2010    cancer 2010, small recurrence in 06/2010  . Colonoscopy 04/2009    moderate int hemorrhoids, rare sigmoid diverticula, one mm sessile hyperplastic rectal polyp  . Esophagogastroduodenoscopy 03/2009    small hh  . Small bowel capsule endoscopy 03/2009    couple of small benign appearing erosions, nonbleeding  . Egd/tcs 08/2007    small hiatal hernia, pancolonic diverticula, friable anal canal, 3cm salmon colored epithelium in distal esophagus, bx negative for Barrett's  . Skin cancer excision Oct 2012    left arm  . Esophagogastroduodenoscopy 11/25/2011    Procedure: ESOPHAGOGASTRODUODENOSCOPY (EGD);  Surgeon: Jay Pyrtle, MD;  Location: [facility] ENDOSCOPY;  Service: Gastroenterology;  Laterality: Left;    Current Outpatient Prescriptions  Medication Sig Dispense Refill  . fish oil-omega-3 fatty acids 1000 MG capsule Take 1 g by mouth 3 (three) times daily.       .   furosemide (LASIX) 20 MG tablet Take 20 mg by mouth daily.       . gabapentin (NEURONTIN) 300 MG capsule Take 1,200 mg by mouth 2 (two) times daily.       . hydrochlorothiazide (MICROZIDE) 12.5 MG capsule Take 12.5 mg by mouth daily.      . insulin aspart (NOVOLOG) 100 UNIT/ML injection Inject 20 Units into the skin 2 (two) times daily. Sliding Scale      . insulin glargine (LANTUS) 100 UNIT/ML injection Inject 100 Units into the skin at bedtime.       . lisinopril (PRINIVIL,ZESTRIL) 20 MG tablet Take 10 mg by mouth daily.       . metFORMIN (GLUCOPHAGE) 1000 MG tablet Take 1,000 mg by mouth 2 (two) times daily with  a meal.        . Multiple Vitamin (MULTIVITAMIN) capsule Take 1 capsule by mouth daily.        . omeprazole (PRILOSEC) 40 MG capsule Take 40 mg by mouth daily.      . pantoprazole (PROTONIX) 40 MG tablet Take 1 tablet (40 mg total) by mouth 2 (two) times daily before a meal.  60 tablet  3  . pramipexole (MIRAPEX) 0.5 MG tablet Take 0.5 mg by mouth at bedtime.       . Propranolol HCl (INDERAL LA PO) Take 40 mg by mouth 2 (two) times daily.      . simvastatin (ZOCOR) 20 MG tablet Take 20 mg by mouth every evening.      . traZODone (DESYREL) 50 MG tablet Take 50 mg by mouth at bedtime.      . dicyclomine (BENTYL) 10 MG capsule Take 1 capsule (10 mg total) by mouth 4 (four) times daily.  120 capsule  0    Allergies as of 12/18/2011 - Review Complete 12/18/2011  Allergen Reaction Noted  . Tylenol (acetaminophen) Other (See Comments) 07/17/2011    Family History  Problem Relation Age of Onset  . Cirrhosis Father     etoh  . Colon cancer Neg Hx   . Kidney cancer Mother   . Cancer Mother   . HIV Brother   . Cirrhosis Brother     nash    History   Social History  . Marital Status: Married    Spouse Name: N/A    Number of Children: 3  . Years of Education: N/A   Occupational History  . disabled    Social History Main Topics  . Smoking status: Current Everyday Smoker -- 1.0 packs/day    Types: Cigarettes  . Smokeless tobacco: None  . Alcohol Use: No     drank heavily for few years in 20s  . Drug Use: No  . Sexually Active: None   Other Topics Concern  . None   Social History Narrative  . None    Review of Systems: Gen: Denies fever, chills, anorexia. Denies fatigue, weakness, weight loss.  CV: Denies chest pain, palpitations, syncope, peripheral edema, and claudication. Resp: Denies dyspnea at rest, cough, wheezing, coughing up blood, and pleurisy. GI: Denies vomiting blood, jaundice, and fecal incontinence.   Denies dysphagia or odynophagia. Derm: Denies rash,  itching, dry skin Psych: Denies depression, anxiety, memory loss, confusion. No homicidal or suicidal ideation.  Heme: Denies bruising, bleeding, and enlarged lymph nodes.  Physical Exam: BP 119/76  Pulse 80  Temp(Src) 98 F (36.7 C) (Temporal)  Ht 5' 7" (1.702 m)  Wt 243 lb 12.8 oz (110.587 kg)  BMI 38.18   kg/m2 General:   Alert and oriented. No distress noted. Pleasant and cooperative.  Head:  Normocephalic and atraumatic. Eyes:  Conjuctiva clear without scleral icterus. Mouth:  Oral mucosa pink and moist. Good dentition. No lesions. Neck:  Supple, without mass or thyromegaly. Heart:  S1, S2 present without murmurs, rubs, or gallops. Regular rate and rhythm. Abdomen:  +BS, soft, non-tender and non-distended. No rebound or guarding. No HSM or masses noted. Possible umbilical hernia.  Msk:  Symmetrical without gross deformities. Normal posture. Extremities:  Without edema. Neurologic:  Alert and  oriented x4;  grossly normal neurologically. Skin:  Intact without significant lesions or rashes. Cervical Nodes:  No significant cervical adenopathy. Psych:  Alert and cooperative. Normal mood and affect.  

## 2012-01-20 ENCOUNTER — Encounter (HOSPITAL_COMMUNITY): Payer: Self-pay | Admitting: Internal Medicine

## 2012-02-15 ENCOUNTER — Encounter (HOSPITAL_COMMUNITY): Payer: Self-pay | Admitting: *Deleted

## 2012-02-15 ENCOUNTER — Emergency Department (HOSPITAL_COMMUNITY): Payer: Medicaid Other

## 2012-02-15 ENCOUNTER — Other Ambulatory Visit: Payer: Self-pay

## 2012-02-15 ENCOUNTER — Inpatient Hospital Stay (HOSPITAL_COMMUNITY)
Admission: EM | Admit: 2012-02-15 | Discharge: 2012-02-18 | DRG: 389 | Disposition: A | Discharge status: Home / Self Care | Payer: Medicaid Other | Attending: General Surgery | Admitting: General Surgery

## 2012-02-15 DIAGNOSIS — K56609 Unspecified intestinal obstruction, unspecified as to partial versus complete obstruction: Principal | ICD-10-CM | POA: Diagnosis present

## 2012-02-15 DIAGNOSIS — D509 Iron deficiency anemia, unspecified: Secondary | ICD-10-CM | POA: Diagnosis present

## 2012-02-15 DIAGNOSIS — L408 Other psoriasis: Secondary | ICD-10-CM | POA: Diagnosis present

## 2012-02-15 DIAGNOSIS — K439 Ventral hernia without obstruction or gangrene: Secondary | ICD-10-CM

## 2012-02-15 DIAGNOSIS — E119 Type 2 diabetes mellitus without complications: Secondary | ICD-10-CM | POA: Diagnosis present

## 2012-02-15 DIAGNOSIS — K429 Umbilical hernia without obstruction or gangrene: Secondary | ICD-10-CM | POA: Diagnosis present

## 2012-02-15 DIAGNOSIS — E875 Hyperkalemia: Secondary | ICD-10-CM | POA: Diagnosis present

## 2012-02-15 DIAGNOSIS — K219 Gastro-esophageal reflux disease without esophagitis: Secondary | ICD-10-CM | POA: Diagnosis present

## 2012-02-15 DIAGNOSIS — E860 Dehydration: Secondary | ICD-10-CM | POA: Diagnosis present

## 2012-02-15 DIAGNOSIS — G2581 Restless legs syndrome: Secondary | ICD-10-CM | POA: Diagnosis present

## 2012-02-15 DIAGNOSIS — F3289 Other specified depressive episodes: Secondary | ICD-10-CM | POA: Diagnosis present

## 2012-02-15 DIAGNOSIS — I8510 Secondary esophageal varices without bleeding: Secondary | ICD-10-CM | POA: Diagnosis present

## 2012-02-15 DIAGNOSIS — G609 Hereditary and idiopathic neuropathy, unspecified: Secondary | ICD-10-CM | POA: Diagnosis present

## 2012-02-15 DIAGNOSIS — K7689 Other specified diseases of liver: Secondary | ICD-10-CM | POA: Diagnosis present

## 2012-02-15 DIAGNOSIS — Z794 Long term (current) use of insulin: Secondary | ICD-10-CM

## 2012-02-15 DIAGNOSIS — Z85528 Personal history of other malignant neoplasm of kidney: Secondary | ICD-10-CM

## 2012-02-15 DIAGNOSIS — E785 Hyperlipidemia, unspecified: Secondary | ICD-10-CM | POA: Diagnosis present

## 2012-02-15 DIAGNOSIS — I10 Essential (primary) hypertension: Secondary | ICD-10-CM | POA: Diagnosis present

## 2012-02-15 DIAGNOSIS — K7460 Unspecified cirrhosis of liver: Secondary | ICD-10-CM | POA: Diagnosis present

## 2012-02-15 DIAGNOSIS — F17200 Nicotine dependence, unspecified, uncomplicated: Secondary | ICD-10-CM | POA: Diagnosis present

## 2012-02-15 DIAGNOSIS — F329 Major depressive disorder, single episode, unspecified: Secondary | ICD-10-CM | POA: Diagnosis present

## 2012-02-15 DIAGNOSIS — N179 Acute kidney failure, unspecified: Secondary | ICD-10-CM | POA: Diagnosis present

## 2012-02-15 HISTORY — DX: Ventral hernia without obstruction or gangrene: K43.9

## 2012-02-15 HISTORY — DX: Unspecified intestinal obstruction, unspecified as to partial versus complete obstruction: K56.609

## 2012-02-15 LAB — DIFFERENTIAL
Basophils Relative: 0 % (ref 0–1)
Eosinophils Absolute: 0.1 10*3/uL (ref 0.0–0.7)
Lymphs Abs: 1.9 10*3/uL (ref 0.7–4.0)
Monocytes Absolute: 0.8 10*3/uL (ref 0.1–1.0)
Monocytes Relative: 7 % (ref 3–12)
Neutro Abs: 8.6 10*3/uL — ABNORMAL HIGH (ref 1.7–7.7)

## 2012-02-15 LAB — CBC
HCT: 45.5 % (ref 39.0–52.0)
Hemoglobin: 15 g/dL (ref 13.0–17.0)
MCH: 30.7 pg (ref 26.0–34.0)
MCHC: 33 g/dL (ref 30.0–36.0)

## 2012-02-15 LAB — COMPREHENSIVE METABOLIC PANEL
ALT: 60 U/L — ABNORMAL HIGH (ref 0–53)
CO2: 22 mEq/L (ref 19–32)
Calcium: 10.3 mg/dL (ref 8.4–10.5)
Chloride: 91 mEq/L — ABNORMAL LOW (ref 96–112)
GFR calc Af Amer: 52 mL/min — ABNORMAL LOW (ref >90)
GFR calc non Af Amer: 45 mL/min — ABNORMAL LOW (ref >90)
Glucose, Bld: 352 mg/dL — ABNORMAL HIGH (ref 70–99)
Sodium: 131 mEq/L — ABNORMAL LOW (ref 135–145)
Total Bilirubin: 1.8 mg/dL — ABNORMAL HIGH (ref 0.3–1.2)

## 2012-02-15 LAB — TYPE AND SCREEN

## 2012-02-15 LAB — GLUCOSE, CAPILLARY: Glucose-Capillary: 239 mg/dL — ABNORMAL HIGH (ref 70–99)

## 2012-02-15 MED ORDER — ONDANSETRON HCL 4 MG/2ML IJ SOLN: 4.0000 mg | Freq: Four times a day (QID) | INTRAMUSCULAR | Status: DC | PRN

## 2012-02-15 MED ORDER — SODIUM CHLORIDE 0.9 % IV SOLN
INTRAVENOUS | Status: DC
  Administered 2012-02-15 – 2012-02-17 (×3): via INTRAVENOUS
  Administered 2012-02-17: 125 mL via INTRAVENOUS
  Administered 2012-02-18: via INTRAVENOUS

## 2012-02-15 MED ORDER — ONDANSETRON HCL 4 MG/2ML IJ SOLN
INTRAMUSCULAR | Status: AC
  Administered 2012-02-15: 4 mg via INTRAVENOUS
  Filled 2012-02-15: qty 2

## 2012-02-15 MED ORDER — PANTOPRAZOLE SODIUM 40 MG IV SOLR
40.0000 mg | INTRAVENOUS | Status: DC
  Administered 2012-02-15 – 2012-02-17 (×3): 40 mg via INTRAVENOUS
  Filled 2012-02-15 (×3): qty 40

## 2012-02-15 MED ORDER — IOHEXOL 300 MG/ML  SOLN
40.0000 mL | Freq: Once | INTRAMUSCULAR | Status: AC | PRN
  Administered 2012-02-15: 40 mL via ORAL

## 2012-02-15 MED ORDER — SODIUM CHLORIDE 0.9 % IV BOLUS (SEPSIS)
500.0000 mL | Freq: Once | INTRAVENOUS | Status: AC
  Administered 2012-02-15: 500 mL via INTRAVENOUS

## 2012-02-15 MED ORDER — INSULIN ASPART 100 UNIT/ML ~~LOC~~ SOLN
10.0000 [IU] | Freq: Once | SUBCUTANEOUS | Status: AC
  Administered 2012-02-15: 10 [IU] via SUBCUTANEOUS
  Filled 2012-02-15: qty 1

## 2012-02-15 MED ORDER — IOHEXOL 300 MG/ML  SOLN
80.0000 mL | Freq: Once | INTRAMUSCULAR | Status: AC | PRN
  Administered 2012-02-15: 80 mL via INTRAVENOUS

## 2012-02-15 MED ORDER — ONDANSETRON HCL 4 MG/2ML IJ SOLN
4.0000 mg | INTRAMUSCULAR | Status: DC | PRN
  Administered 2012-02-15 – 2012-02-16 (×5): 4 mg via INTRAVENOUS
  Filled 2012-02-15 (×4): qty 2

## 2012-02-15 MED ORDER — INSULIN ASPART 100 UNIT/ML ~~LOC~~ SOLN
0.0000 [IU] | SUBCUTANEOUS | Status: DC
  Administered 2012-02-15: 22:00:00 via SUBCUTANEOUS
  Administered 2012-02-16: 5 [IU] via SUBCUTANEOUS
  Administered 2012-02-16: 3 [IU] via SUBCUTANEOUS
  Administered 2012-02-16 (×2): 8 [IU] via SUBCUTANEOUS
  Administered 2012-02-16 – 2012-02-17 (×3): 3 [IU] via SUBCUTANEOUS
  Administered 2012-02-17: 5 [IU] via SUBCUTANEOUS
  Administered 2012-02-17 (×2): 3 [IU] via SUBCUTANEOUS
  Administered 2012-02-17: 2 [IU] via SUBCUTANEOUS
  Administered 2012-02-17: 8 [IU] via SUBCUTANEOUS
  Administered 2012-02-18 (×3): 3 [IU] via SUBCUTANEOUS

## 2012-02-15 MED ORDER — ONDANSETRON HCL 4 MG/2ML IJ SOLN
4.0000 mg | Freq: Once | INTRAMUSCULAR | Status: AC
  Administered 2012-02-15: 4 mg via INTRAVENOUS
  Filled 2012-02-15: qty 2

## 2012-02-15 MED ORDER — LACTATED RINGERS IV SOLN
INTRAVENOUS | Status: DC
  Administered 2012-02-15: 17:00:00 via INTRAVENOUS

## 2012-02-15 MED ORDER — SODIUM CHLORIDE 0.9 % IV BOLUS (SEPSIS)
500.0000 mL | Freq: Once | INTRAVENOUS | Status: AC
  Administered 2012-02-15: 15:00:00 via INTRAVENOUS

## 2012-02-15 MED ORDER — HYDROMORPHONE HCL PF 1 MG/ML IJ SOLN
1.0000 mg | INTRAMUSCULAR | Status: DC | PRN
  Administered 2012-02-15 – 2012-02-16 (×4): 1 mg via INTRAVENOUS
  Administered 2012-02-16: 2 mg via INTRAVENOUS
  Administered 2012-02-16: 1 mg via INTRAVENOUS
  Filled 2012-02-15 (×4): qty 1
  Filled 2012-02-15: qty 2
  Filled 2012-02-15: qty 1

## 2012-02-15 MED ORDER — HYDROMORPHONE HCL PF 1 MG/ML IJ SOLN
1.0000 mg | Freq: Once | INTRAMUSCULAR | Status: AC
  Administered 2012-02-15: 1 mg via INTRAVENOUS
  Filled 2012-02-15: qty 1

## 2012-02-15 MED ORDER — ENOXAPARIN SODIUM 40 MG/0.4ML ~~LOC~~ SOLN
40.0000 mg | SUBCUTANEOUS | Status: DC
  Administered 2012-02-15: 40 mg via SUBCUTANEOUS
  Filled 2012-02-15: qty 0.4

## 2012-02-15 NOTE — ED Notes (Signed)
Pt drinking contrast  Resting inbed, wife at bedside.

## 2012-02-15 NOTE — ED Notes (Signed)
Pt requested bsc, passed large amounts of "gas" per family member.

## 2012-02-15 NOTE — ED Notes (Signed)
md perfromed rectal exam, hemmocult was NEG

## 2012-02-15 NOTE — ED Notes (Signed)
Pt presents with emesis x 3 days with blood in vomit per pt. Pt is a diabetic. Pt's last emesis was this morning.

## 2012-02-15 NOTE — ED Notes (Signed)
Pt resting in bed, voices no complaints

## 2012-02-15 NOTE — Progress Notes (Signed)
Called into patients room. Patient actively vomiting small amount of brown foul smelling emesis. Obtained order to change frequency to Q4H Zofran 4mg  IV. Will continue to monitor.

## 2012-02-15 NOTE — ED Provider Notes (Signed)
History   This chart was scribed for Kyle Gaskins, MD by Melba Coon. The patient was seen in room APA07/APA07 and the patient's care was started at 12:32PM.     CSN: [id number]  Arrival date & time 02/15/12  1141   First MD Initiated Contact with Patient 02/15/12 1210      Chief Complaint  Patient presents with  . Hematemesis     Kyle [name] is a 56 y.o. male who presents to the Emergency Department complaining of constant, moderate to severe abdominal pain with associated emesis 12 times, dark hematemesis the last episode, with an onset 2 days ago. Pt states that his course is worsening. Nothing alleviates or aggravates the symptoms. No HA, hemoptysis, fever, chills, CP, SOB, syncope, diarrhea, hematochezia, or extremity pain, numbness, or tingling. Pt has a Hx of bladder CA, esophageal varices, cirrhosis and has had a colonoscopy, endoscopy, bladder surgery, and adrenal gland removal. He reports his course is worsening Nothing improves his symptoms He has not had any BM in past 24 hours  Past Medical History  Diagnosis Date  . DM (diabetes mellitus)   . Cirrhosis     bx proven steatohepatitis with cirrhosis (2010); per note from Feb 2011, received Hep A and B vaccines in 2010  . HTN (hypertension)   . RLS (restless legs syndrome)   . Sleep apnea   . Hyperlipidemia   . IDA (iron deficiency anemia)   . GERD (gastroesophageal reflux disease)   . Depression   . Peripheral neuropathy   . Urothelial cancer     2010, paillary low-grade, h/o recurrence 2011  . B12 deficiency   . Psoriasis   . 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 Anemia due to multiple mechanisms 05/13/2011  . History of alcohol abuse 05/13/2011  . ANEMIA-IRON DEFICIENCY 03/09/2009  . S/P endoscopy May 2012    4 columns grade II esophageal varices; due for repeat in Nov 2013   . S/P colonoscopy May 2012    Tubular adenoma  . Low back pain   . Cancer     bladder ca 05/2009 removal and   w/chemo wash  . Esophageal varices with bleeding 11/24/11    s/p emergent EGD 11/25/11 by Dr. Rhea Belton at [facility], Grade III esophageal varices s/p banding X 5    Past Surgical History  Procedure Date  . Carpel tunnel   . Shoulder surgery   . Adrenal mass surgery 05/2009    benign, left  . Bladder surgery 01/2009 and 06/2010    cancer 2010, small recurrence in 06/2010  . Colonoscopy 04/2009    moderate int hemorrhoids, rare sigmoid diverticula, one mm sessile hyperplastic rectal polyp  . Esophagogastroduodenoscopy 03/2009    small hh  . Small bowel capsule endoscopy 03/2009    couple of small benign appearing erosions, nonbleeding  . Egd/tcs 08/2007    small hiatal hernia, pancolonic diverticula, friable anal canal, 3cm salmon colored epithelium in distal esophagus, bx negative for Barrett's  . Skin cancer excision Oct 2012    left arm  . Esophagogastroduodenoscopy 11/25/2011    Procedure: ESOPHAGOGASTRODUODENOSCOPY (EGD);  Surgeon: Erick Blinks, MD;  Location: [facility] ENDOSCOPY;  Service: Gastroenterology;  Laterality: Left;  . Esophagogastroduodenoscopy 01/14/2012    Procedure: ESOPHAGOGASTRODUODENOSCOPY (EGD);  Surgeon: Corbin Ade, MD;  Location: [facility] ENDO SUITE;  Service: Endoscopy;  Laterality: N/A;  7:30    Family History  Problem Relation Age of Onset  . Cirrhosis Father     etoh  .  Colon cancer Neg Hx   . Anesthesia problems Neg Hx   . Hypotension Neg Hx   . Malignant hyperthermia Neg Hx   . Pseudochol deficiency Neg Hx   . Kidney cancer Mother   . Cancer Mother   . HIV Brother   . Cirrhosis Brother     nash    History  Substance Use Topics  . Smoking status: Current Everyday Smoker -- 1.5 packs/day for 30 years    Types: Cigarettes  . Smokeless tobacco: Not on file  . Alcohol Use: No     drank heavily for few years in 20s      Review of Systems 10 Systems reviewed and are negative for acute change except as noted in the Kyle.  Allergies  Tylenol  Home Medications    Current Outpatient Rx  Name Route Sig Dispense Refill  . DICYCLOMINE HCL 10 MG PO CAPS Oral Take 1 capsule (10 mg total) by mouth 4 (four) times daily. 120 capsule 0  . OMEGA-3 FATTY ACIDS 1000 MG PO CAPS Oral Take 1 g by mouth 3 (three) times daily.     . FUROSEMIDE 20 MG PO TABS Oral Take 20 mg by mouth daily.     [name] GABAPENTIN 300 MG PO CAPS Oral Take 1,200 mg by mouth 2 (two) times daily.     [name] HYDROCHLOROTHIAZIDE 12.5 MG PO CAPS Oral Take 12.5 mg by mouth daily.    . INSULIN ASPART 100 UNIT/ML Warner SOLN Subcutaneous Inject 20 Units into the skin 2 (two) times daily. Sliding Scale    . INSULIN GLARGINE 100 UNIT/ML [place]bilt SOLN Subcutaneous Inject 100 Units into the skin at bedtime.     [name] LISINOPRIL 20 MG PO TABS Oral Take 10 mg by mouth daily.     [name] METFORMIN HCL 1000 MG PO TABS Oral Take 1,000 mg by mouth 2 (two) times daily with a meal.      . MULTIVITAMINS PO CAPS Oral Take 1 capsule by mouth daily.      [name] OMEPRAZOLE 40 MG PO CPDR Oral Take 40 mg by mouth daily.    [name] PANTOPRAZOLE SODIUM 40 MG PO TBEC Oral Take 1 tablet (40 mg total) by mouth 2 (two) times daily before a meal. 60 tablet 3  . PRAMIPEXOLE DIHYDROCHLORIDE 0.5 MG PO TABS Oral Take 0.5 mg by mouth at bedtime.     Bevely Palmer LA PO Oral Take 40 mg by mouth 2 (two) times daily.    [name] SIMVASTATIN 20 MG PO TABS Oral Take 20 mg by mouth every evening.    . TRAZODONE HCL 50 MG PO TABS Oral Take 50 mg by mouth at bedtime.      BP 135/78  Pulse 98  Temp(Src) 98.1 F (36.7 C) (Oral)  Resp 18  Ht 5\' 7"  (1.702 m)  Wt 240 lb (108.863 kg)  BMI 37.59 kg/m2  SpO2 96% BP 104/61  Pulse 105  Temp(Src) 98.1 F (36.7 C) (Oral)  Resp 20  Ht 5\' 7"  (1.702 m)  Wt 240 lb (108.863 kg)  BMI 37.59 kg/m2  SpO2 95%   Physical Exam CONSTITUTIONAL: Well developed/well nourished HEAD AND FACE: Normocephalic/atraumatic EYES: EOMI/PERRL ENMT: Mucous membranes moist NECK: supple no meningeal signs SPINE:entire spine nontender CV: S1/S2 noted,  no murmurs/rubs/gallops noted LUNGS: Lungs are clear to auscultation bilaterally, no apparent distress ABDOMEN: soft, distended, focal moderate tenderness right abdomen.  No rebound/guarding is noted.  Decreased BS noted throughout abdomen GU:no cva tenderness;  Rectal:  stool color nml, chaperone present NEURO: Pt is awake/alert, moves all extremitiesx4 EXTREMITIES: pulses normal, full ROM SKIN: warm, color normal PSYCH: no abnormalities of mood noted  ED Course  Procedures   DIAGNOSTIC STUDIES: Oxygen Saturation is 96% on room air, normal by my interpretation.    COORDINATION OF CARE:  12:40PM - EDMD will order abd CAT scan nd pain meds for the pt. EDMD alerts pt that he might have a hernia with obstruction. Pt with h/o esophageal varices, though stool color normal and suspect he has abdominal process/obstruction rather than acute GI bleed 2:05PM - recheck; pt is feeling much better D/w CT tech, his GFR will tolerate low dose of IV contrast IV fluids given  3:33 PM CT findings I paged surgery 3:46 PM D/w dr zieglar, surgery he will admit patient, he does not want NG Tube, he will call in orders, admit to general floor Pt will need to have metformin held due to IV contrast   Labs Reviewed  GLUCOSE, CAPILLARY - Abnormal; Notable for the following:    Glucose-Capillary 318 (*)    All other components within normal limits  CBC - Abnormal; Notable for the following:    WBC 11.3 (*)    RDW 16.6 (*)    Platelets 100 (*)    All other components within normal limits  DIFFERENTIAL - Abnormal; Notable for the following:    Neutro Abs 8.6 (*)    All other components within normal limits  PROTIME-INR - Abnormal; Notable for the following:    Prothrombin Time 16.2 (*)    All other components within normal limits  COMPREHENSIVE METABOLIC PANEL - Abnormal; Notable for the following:    Sodium 131 (*)    Potassium 5.4 (*)    Chloride 91 (*)    Glucose, Bld 352 (*)    BUN 30 (*)     Creatinine, Ser 1.67 (*)    Total Protein 9.0 (*)    AST 50 (*)    ALT 60 (*)    Total Bilirubin 1.8 (*)    GFR calc non Af Amer 45 (*)    GFR calc Af Amer 52 (*)    All other components within normal limits  TYPE AND SCREEN      MDM  Nursing notes reviewed and considered in documentation All labs/vitals reviewed and considered Previous records reviewed and considered    Date: 02/15/2012  Rate: 105  Rhythm: sinus tachycardia  QRS Axis: left  Intervals: normal  ST/T Wave abnormalities: nonspecific ST changes  Conduction Disutrbances:none  Narrative Interpretation:   Old EKG Reviewed: unchanged     I personally performed the services described in this documentation, which was scribed in my presence. The recorded information has been reviewed and considered.          Kyle Gaskins, MD 02/15/12 [phone]

## 2012-02-15 NOTE — ED Notes (Signed)
sats dropped to 89% when sleeping, 02 applied.  sats increased on 2l/Coco, 95

## 2012-02-16 ENCOUNTER — Encounter (HOSPITAL_COMMUNITY): Payer: Self-pay | Admitting: Urgent Care

## 2012-02-16 DIAGNOSIS — K7460 Unspecified cirrhosis of liver: Secondary | ICD-10-CM

## 2012-02-16 DIAGNOSIS — K429 Umbilical hernia without obstruction or gangrene: Secondary | ICD-10-CM

## 2012-02-16 DIAGNOSIS — K56609 Unspecified intestinal obstruction, unspecified as to partial versus complete obstruction: Secondary | ICD-10-CM

## 2012-02-16 LAB — GLUCOSE, CAPILLARY
Glucose-Capillary: 243 mg/dL — ABNORMAL HIGH (ref 70–99)
Glucose-Capillary: 263 mg/dL — ABNORMAL HIGH (ref 70–99)

## 2012-02-16 LAB — COMPREHENSIVE METABOLIC PANEL
ALT: 47 U/L (ref 0–53)
Albumin: 3.7 g/dL (ref 3.5–5.2)
BUN: 41 mg/dL — ABNORMAL HIGH (ref 6–23)
Calcium: 9.3 mg/dL (ref 8.4–10.5)
Calcium: 9.7 mg/dL (ref 8.4–10.5)
Creatinine, Ser: 3.16 mg/dL — ABNORMAL HIGH (ref 0.50–1.35)
Creatinine, Ser: 3.35 mg/dL — ABNORMAL HIGH (ref 0.50–1.35)
GFR calc Af Amer: 24 mL/min — ABNORMAL LOW (ref >90)
GFR calc Af Amer: 22 mL/min — ABNORMAL LOW (ref >90)
GFR calc non Af Amer: 19 mL/min — ABNORMAL LOW (ref >90)
Glucose, Bld: 248 mg/dL — ABNORMAL HIGH (ref 70–99)
Glucose, Bld: 248 mg/dL — ABNORMAL HIGH (ref 70–99)
Sodium: 136 mEq/L (ref 135–145)
Total Protein: 8.5 g/dL — ABNORMAL HIGH (ref 6.0–8.3)
Total Protein: 8.8 g/dL — ABNORMAL HIGH (ref 6.0–8.3)

## 2012-02-16 LAB — CBC
HCT: 44 % (ref 39.0–52.0)
MCH: 30 pg (ref 26.0–34.0)
MCHC: 32 g/dL (ref 30.0–36.0)
MCV: 93.6 fL (ref 78.0–100.0)
RDW: 17 % — ABNORMAL HIGH (ref 11.5–15.5)

## 2012-02-16 LAB — DIFFERENTIAL
Lymphs Abs: 1.7 10*3/uL (ref 0.7–4.0)
Monocytes Relative: 14 % — ABNORMAL HIGH (ref 3–12)
Neutro Abs: 5.9 10*3/uL (ref 1.7–7.7)
Neutrophils Relative %: 66 % (ref 43–77)

## 2012-02-16 LAB — MRSA PCR SCREENING: MRSA by PCR: NEGATIVE

## 2012-02-16 MED ORDER — INSULIN ASPART 100 UNIT/ML ~~LOC~~ SOLN
10.0000 [IU] | Freq: Once | SUBCUTANEOUS | Status: AC
  Administered 2012-02-16: 10 [IU] via INTRAVENOUS

## 2012-02-16 MED ORDER — MORPHINE SULFATE 2 MG/ML IJ SOLN
1.0000 mg | INTRAMUSCULAR | Status: DC | PRN
  Administered 2012-02-16 – 2012-02-18 (×3): 1 mg via INTRAVENOUS
  Filled 2012-02-16 (×3): qty 1

## 2012-02-16 MED ORDER — SODIUM CHLORIDE 0.9 % IV SOLN
350.0000 mg | Freq: Once | INTRAVENOUS | Status: AC
  Administered 2012-02-16: 350 mg via INTRAVENOUS
  Filled 2012-02-16: qty 3.5

## 2012-02-16 MED ORDER — SODIUM CHLORIDE 0.9 % IJ SOLN
INTRAMUSCULAR | Status: AC
  Administered 2012-02-16: 11:00:00
  Filled 2012-02-16: qty 3

## 2012-02-16 MED ORDER — SODIUM CHLORIDE 0.9 % IV BOLUS (SEPSIS)
1000.0000 mL | Freq: Once | INTRAVENOUS | Status: AC
  Administered 2012-02-16: 1000 mL via INTRAVENOUS

## 2012-02-16 MED ORDER — SODIUM CHLORIDE 0.9 % IJ SOLN
INTRAMUSCULAR | Status: AC
  Administered 2012-02-16: 10 mL
  Filled 2012-02-16: qty 3

## 2012-02-16 MED ORDER — SODIUM CHLORIDE 0.9 % IJ SOLN
INTRAMUSCULAR | Status: AC
  Administered 2012-02-16: 3 mL
  Filled 2012-02-16: qty 3

## 2012-02-16 MED ORDER — DEXTROSE 50 % IV SOLN
1.0000 | Freq: Once | INTRAVENOUS | Status: AC
  Administered 2012-02-16: 50 mL via INTRAVENOUS

## 2012-02-16 MED ORDER — DEXTROSE 50 % IV SOLN
INTRAVENOUS | Status: AC
  Filled 2012-02-16: qty 50

## 2012-02-16 MED ORDER — SODIUM CHLORIDE 0.9 % IJ SOLN
INTRAMUSCULAR | Status: AC
  Filled 2012-02-16: qty 3

## 2012-02-16 MED ORDER — METOPROLOL TARTRATE 1 MG/ML IV SOLN
5.0000 mg | Freq: Four times a day (QID) | INTRAVENOUS | Status: DC
  Administered 2012-02-16 – 2012-02-18 (×8): 5 mg via INTRAVENOUS
  Filled 2012-02-16 (×8): qty 5

## 2012-02-16 MED ORDER — PROMETHAZINE HCL 25 MG/ML IJ SOLN
12.5000 mg | INTRAMUSCULAR | Status: DC | PRN
  Administered 2012-02-16: 04:00:00 via INTRAVENOUS
  Administered 2012-02-16 – 2012-02-17 (×2): 12.5 mg via INTRAVENOUS
  Filled 2012-02-16 (×3): qty 1

## 2012-02-16 MED ORDER — FUROSEMIDE 10 MG/ML IJ SOLN
40.0000 mg | Freq: Once | INTRAMUSCULAR | Status: AC
  Administered 2012-02-16: 40 mg via INTRAVENOUS
  Filled 2012-02-16: qty 4

## 2012-02-16 MED ORDER — FUROSEMIDE 10 MG/ML IJ SOLN
40.0000 mg | Freq: Two times a day (BID) | INTRAMUSCULAR | Status: AC
  Administered 2012-02-17 (×2): 40 mg via INTRAVENOUS
  Filled 2012-02-16 (×2): qty 4

## 2012-02-16 NOTE — Progress Notes (Signed)
Notified Dr. Leticia Penna of patient's condition.  Patient admitted for small bowel obstruction and is still have episodes of nausea and vomiting despite prn nausea medicine.  Patient's blood pressure is 96/67. Oxygen saturation in low 90s on 4liters of oxygen.  Patient is still alert and oriented at this time, appears severely uncomfortable.  Received order for nausea medicine and fluid bolus and telemetry.  Notifed patient of Dr. Illene Regulus order's and will continue to closely monitor patient.

## 2012-02-16 NOTE — Consult Note (Addendum)
Referring Provider: Dr. Caesar Bookman Primary Care Physician:  Redmond Baseman, MD, MD Primary Gastroenterologist:  Dr. Jena Gauss  Reason for Consultation:  Small bowel obstruction, cirrhosis  HPI: Kyle [name] is a 56 y.o. male who was admitted w/ high grade SBO & umbilical hernia.  His wife tells me 3 days ago in the evening, he began to c/o severe abd pain.  He continued "Feeling sick" all weekend.  Started to not be able to keep down any liquids.  He was vomiting TNTC liquid emesis.  Last BM 3 days ago.  Wife denies rectal bleeding or melena.  Denies fever or chills.  Denies heartburn or indigestion.  CT w/o contrast shows->High-grade small bowel obstruction secondary to a ventral anterior abdominal wall hernia just superior to the umbilicus. This finding is associated a minimal amount of fluid adjacent to the herniated loop of bowel as well as minimal bowel wall thickening within the adjacent loop of small bowel.  K 6.2 & creatinine previously normal is now 3.16.  Hgb is stable.  Past Medical History  Diagnosis Date  . DM (diabetes mellitus)   . Cirrhosis     bx proven steatohepatitis with cirrhosis (2010); per note from Feb 2011, received Hep A and B vaccines in 2010  . HTN (hypertension)   . RLS (restless legs syndrome)   . Sleep apnea   . Hyperlipidemia   . IDA (iron deficiency anemia)   . GERD (gastroesophageal reflux disease)   . Depression   . Peripheral neuropathy   . Urothelial cancer     2010, paillary low-grade, h/o recurrence 2011  . B12 deficiency   . Psoriasis   . 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 Anemia due to multiple mechanisms 05/13/2011  . History of alcohol abuse 05/13/2011  . ANEMIA-IRON DEFICIENCY 03/09/2009  . S/P endoscopy May 2012    4 columns grade II esophageal varices; due for repeat in Nov 2013   . S/P colonoscopy May 2012    Tubular adenoma  . Low back pain   . Cancer     bladder ca 05/2009 removal and  w/chemo wash  . Esophageal  varices with bleeding 11/24/11    s/p emergent EGD 11/25/11 by Dr[facility] R[facility], Grade III esophageal varices s/p banding X 5    Past Surgical History  Procedure Date  . Carpel tunnel   . Shoulder surgery   . Adrenal mass surgery 05/2009    benign, left  . Bladder surgery 01/2009 and 06/2010    cancer 2010, small recurrence in 06/2010  . Colonoscopy 04/2009    moderate int hemorrhoids, rare sigmoid diverticula, one mm sessile hyperplastic rectal polyp  . Esophagogastroduodenoscopy 03/2009    small hh  . Small bowel capsule endoscopy 03/2009    couple of small benign appearing erosions, nonbleeding  . Egd/tcs 08/2007    small hiatal hernia, pancolonic diverticula, friable anal canal, 3cm salmon colored epithelium in distal esophagus, bx negative for Barrett's  . Skin cancer excision Oct 2012    left arm  . Esophagogastroduodenoscopy 11/25/2011    Procedure: ESOPHAGOGASTRODUODENOSCOPY (EGD);  Surgeon: Erick Blinks, MD;  Location: [facility] ENDOSCOPY;  Service: Gastroenterology;  Laterality: Left;  . Esophagogastroduodenoscopy 01/14/2012    Dr Angelic Schnelle->4-5 columns Gr2 varices, 6 bands placed, HH, distal esophageal ulcer, portal gastropathy, antral erosions    Prior to Admission medications   Medication Sig Start Date End Date Taking? Authorizing Provider  dicyclomine (BENTYL) 10 MG capsule Take 1 capsule (10 mg total) by  mouth 4 (four) times daily. 12/18/11 12/17/12 Yes Nira Retort, NP  fish oil-omega-3 fatty acids 1000 MG capsule Take 1 g by mouth 3 (three) times daily.    Yes Historical Provider, MD  furosemide (LASIX) 20 MG tablet Take 20 mg by mouth daily.    Yes Historical Provider, MD  gabapentin (NEURONTIN) 300 MG capsule Take 1,200 mg by mouth 2 (two) times daily.    Yes Historical Provider, MD  hydrochlorothiazide (MICROZIDE) 12.5 MG capsule Take 12.5 mg by mouth daily.   Yes Historical Provider, MD  lisinopril (PRINIVIL,ZESTRIL) 20 MG tablet Take 10 mg by mouth daily.    Yes Historical Provider,  MD  metFORMIN (GLUCOPHAGE) 1000 MG tablet Take 1,000 mg by mouth 2 (two) times daily with a meal.     Yes Historical Provider, MD  Multiple Vitamin (MULTIVITAMIN) capsule Take 1 capsule by mouth daily.     Yes Historical Provider, MD  omeprazole (PRILOSEC) 40 MG capsule Take 40 mg by mouth daily.   Yes Historical Provider, MD  pantoprazole (PROTONIX) 40 MG tablet Take 1 tablet (40 mg total) by mouth 2 (two) times daily before a meal. 11/28/11 11/27/12 Yes Simonne Martinet, NP  pramipexole (MIRAPEX) 0.5 MG tablet Take 0.5 mg by mouth at bedtime.    Yes Historical Provider, MD  Propranolol HCl (INDERAL LA PO) Take 40 mg by mouth 2 (two) times daily.   Yes Historical Provider, MD  simvastatin (ZOCOR) 20 MG tablet Take 20 mg by mouth every evening.   Yes Historical Provider, MD  traZODone (DESYREL) 50 MG tablet Take 50 mg by mouth at bedtime.   Yes Historical Provider, MD  insulin aspart (NOVOLOG) 100 UNIT/ML injection Inject 20 Units into the skin 2 (two) times daily. Sliding Scale    Historical Provider, MD  insulin glargine (LANTUS) 100 UNIT/ML injection Inject 100 Units into the skin at bedtime.     Historical Provider, MD    Current Facility-Administered Medications  Medication Dose Route Frequency Provider Last Rate Last Dose  . 0.9 %  sodium chloride infusion   Intravenous Continuous Fabio Bering, MD 125 mL/hr at 02/16/12 [id number]    . calcium chloride 350 mg in sodium chloride 0.9 % 100 mL IVPB  350 mg Intravenous Once Fabio Bering, MD   350 mg at 02/16/12 1047  . dextrose 50 % solution 50 mL  1 ampule Intravenous Once Fabio Bering, MD   50 mL at 02/16/12 0848  . dextrose 50 % solution           . enoxaparin (LOVENOX) injection 40 mg  40 mg Subcutaneous Q24H Fabio Bering, MD   40 mg at 02/15/12 1951  . furosemide (LASIX) injection 40 mg  40 mg Intravenous Once Fabio Bering, MD   40 mg at 02/16/12 0848  . HYDROmorphone (DILAUDID) injection 1 mg  1 mg Intravenous Once [name],  MD   1 mg at 02/15/12 1253  . HYDROmorphone (DILAUDID) injection 1-2 mg  1-2 mg Intravenous Q4H PRN Fabio Bering, MD   1 mg at 02/16/12 1023  . insulin aspart (novoLOG) injection 0-15 Units  0-15 Units Subcutaneous Q4H Fabio Bering, MD   8 Units at 02/16/12 [phone]  . insulin aspart (novoLOG) injection 10 Units  10 Units Subcutaneous Once [name], MD   10 Units at 02/15/12 1603  . insulin aspart (novoLOG) injection 10 Units  10 Units Intravenous Once Fabio Bering, MD  10 Units at 02/16/12 0850  . iohexol (OMNIPAQUE) 300 MG/ML solution 40 mL  40 mL Oral Once PRN Fabio Bering, MD   40 mL at 02/15/12 1300  . iohexol (OMNIPAQUE) 300 MG/ML solution 80 mL  80 mL Intravenous Once PRN Fabio Bering, MD   80 mL at 02/15/12 1458  . ondansetron (ZOFRAN) injection 4 mg  4 mg Intravenous Once [name], MD   4 mg at 02/15/12 1253  . ondansetron (ZOFRAN) injection 4 mg  4 mg Intravenous Q4H PRN Fabio Bering, MD   4 mg at 02/16/12 1025  . pantoprazole (PROTONIX) injection 40 mg  40 mg Intravenous Q24H Fabio Bering, MD   40 mg at 02/15/12 1741  . promethazine (PHENERGAN) injection 12.5 mg  12.5 mg Intravenous Q4H PRN Fabio Bering, MD   12.5 mg at 02/16/12 0845  . sodium chloride 0.9 % bolus 1,000 mL  1,000 mL Intravenous Once Fabio Bering, MD   1,000 mL at 02/16/12 0427  . sodium chloride 0.9 % bolus 500 mL  500 mL Intravenous Once [name], MD      . sodium chloride 0.9 % bolus 500 mL  500 mL Intravenous Once [name], MD   500 mL at 02/15/12 1605  . sodium chloride 0.9 % injection           . sodium chloride 0.9 % injection           . sodium chloride 0.9 % injection        10 mL at 02/16/12 0846  . sodium chloride 0.9 % injection        10 mL at 02/16/12 1026  . sodium chloride 0.9 % injection           . DISCONTD: lactated ringers infusion   Intravenous Continuous Fabio Bering, MD 120 mL/hr at 02/15/12 1724    . DISCONTD: ondansetron (ZOFRAN)  injection 4 mg  4 mg Intravenous Q6H PRN Fabio Bering, MD        Allergies as of 02/15/2012 - Review Complete 02/15/2012  Allergen Reaction Noted  . Tylenol (acetaminophen) Other (See Comments) 07/17/2011    Family History  Problem Relation Age of Onset  . Cirrhosis Father     etoh  . Colon cancer Neg Hx   . Anesthesia problems Neg Hx   . Hypotension Neg Hx   . Malignant hyperthermia Neg Hx   . Pseudochol deficiency Neg Hx   . Kidney cancer Mother   . Cancer Mother   . HIV Brother   . Cirrhosis Brother     nash    History   Social History  . Marital Status: Married    Spouse Name: N/A    Number of Children: 3  . Years of Education: N/A   Occupational History  . disabled    Social History Main Topics  . Smoking status: Current Everyday Smoker -- 1.5 packs/day for 30 years    Types: Cigarettes  . Smokeless tobacco: Not on file  . Alcohol Use: No     drank heavily for few years in 20s  . Drug Use: No  . Sexually Active: Yes    Birth Control/ Protection: None   Other Topics Concern  . Not on file   Social History Narrative  . No narrative on file    Review of Systems: See HPI, otherwise negative ROS  Physical Exam: Vital signs in last 24 hours:  Temp:  [97.7 F (36.5 C)-98.3 F (36.8 C)] 98.3 F (36.8 C) (03/25 1035) Pulse Rate:  [87-115] 115  (03/25 1035) Resp:  [18-22] 20  (03/25 1035) BP: (96-146)/(35-85) 146/78 mmHg (03/25 1035) SpO2:  [88 %-98 %] 91 % (03/25 1048) Weight:  [235 lb 7.2 oz (106.8 kg)-240 lb (108.863 kg)] 235 lb 7.2 oz (106.8 kg) (03/24 1704) Last BM Date: 02/15/12 General:   Alert, obese, Very anxious-appearring, sitting on side of bed, labored RR.  Pulse ox 77% on 3LPM, ruddy facial complexion.  Wife @ bedside. Head:  Normocephalic and atraumatic. Eyes:  Sclera clear, no icterus.   Conjunctiva pink. Ears:  Normal auditory acuity. Nose:  No deformity, discharge, or lesions. Mouth:  No deformity or lesions,oropharynx pink &  moist. Neck:  Supple; no masses or thyromegaly. Lungs:  + Labored.  Otherwise, Clear throughout to auscultation.   No wheezes, crackles, or rhonchi. Heart: + tachycardia. Abdomen:  Distended, firm, palpable umbilical hernia, tenderness to umbilicus.  Decreased bowel sounds.    + guarding or rebound tenderness.   Rectal:  Deferred. Msk:  Symmetrical withounormt gross deformities. Normal posture. Pulses:  Normal pulses noted. Extremities: +clubbing, 1+ LEE bilat.   Neurologic:  Alert and oriented x4;  grossly normal neurologically. Skin:  Intact without significant lesions or rashes. Lymph Nodes:  No significant cervical adenopathy.  Intake/Output from previous day: 03/24 0701 - 03/25 0700 In: -  Out: 2 [Urine:1; Emesis/NG output:1] Intake/Output this shift:    Lab Results:  Basename 02/16/12 0508 02/15/12 1148  WBC 8.9 11.3*  HGB 14.1 15.0  HCT 44.0 45.5  PLT 111* 100*   BMET  Basename 02/16/12 0508 02/15/12 1148  NA 133* 131*  K 6.2* 5.4*  CL 94* 91*  CO2 22 22  GLUCOSE 248* 352*  BUN 41* 30*  CREATININE 3.16* 1.67*  CALCIUM 9.3 10.3   LFT  Basename 02/16/12 0508 02/15/12 1148  PROT 8.5* 9.0*  ALBUMIN 3.7 4.0  AST 31 50*  ALT 48 60*  ALKPHOS 56 62  BILITOT 1.2 1.8*  BILIDIR -- --  IBILI -- --  LIPASE -- --  AMYLASE -- --   PT/INR  Basename 02/15/12 1148  LABPROT 16.2*  INR 1.27   Studies/Results: Ct Abdomen Pelvis W Contrast  02/15/2012  *RADIOLOGY REPORT*  Clinical Data: Mid to lower abdominal pain, nausea and vomiting, history of cirrhosis and bladder cancer; post left adrenal gland surgery.  CT ABDOMEN AND PELVIS WITH CONTRAST  Technique:  Multidetector CT imaging of the abdomen and pelvis was performed following the standard protocol during bolus administration of intravenous contrast.  Contrast:  80 ml Omnipaque-300  Comparison: CT abdomen pelvis - 11/13/2011  Findings:  Nodular hepatic contour with borderline splenomegaly hypertrophy of multiple  venous collaterals within the upper abdomen compatible with provided history of cirrhosis.  No ascites.  Portal vein remains patent.  There is symmetric enhancement of the bilateral kidneys.  There is delayed excretion from the bilateral kidneys.  The left kidney is again noted to have a slightly anteriorly rotated lie, likely secondary to enlargement of the spleen.  No urinary obstruction. Normal pancreas.  The right adrenal gland appears normal.  The left adrenal gland is not definitively identified compatible with provided history.  There is a small bowel obstruction secondary to a midline ventral anterior abdominal wall hernia superior to the umbilicus(axial image 55, series 2, sagittal image 68, series 5). There is mild bowel wall thickening with an adjacent loop of small bowel (  axial image 65, series 2) with upstream dilatation of multiple loops of small bowel.  There is a minimal amount of fluid adjacent to the herniated loop of bowel.  The distal small bowel and colon are decompressed.  No pneumoperitoneum, pneumatosis or portal venous gas.  Scattered colonic diverticulosis without evidence of diverticulitis.  Scattered atherosclerotic calcifications and nonaneurysmal abdominal aorta. The lack of a venous contrast and 2 mm evaluate the branch vessels of the abdominal aorta and bearing parity. Redemonstrated port hepatis adenopathy within the porta hepatis with index port hepatis lymph node measuring 1.8 cm in short axis diameter (image 34). Unchanged borderline enlarged right pelvic sidewall lymph node measuring 1 cm in short axis diameter (image 79).  Limited visualization of the lower thorax demonstrates bibasilar dependent atelectasis, left greater than right.  No focal airspace opacities.  No pleural effusion or pneumothorax.  Normal heart size.  No pericardial effusion.  No acute or aggressive osseous abnormalities.  Minimal (2-3 mm) of anterolisthesis of L5 upon S1 with associated bilateral pars L5  pars defects.  This is associated with mild to moderate DDD at L5 - S1. Unchanged mild (under 25%) anterior compression deformity of several mid thoracic vertebral bodies.  IMPRESSION:  1.  High-grade small bowel obstruction secondary to a ventral anterior abdominal wall hernia just superior to the umbilicus. This finding is associated a minimal amount of fluid adjacent to the herniated loop of bowel as well as minimal bowel wall thickening within the adjacent loop of small bowel.  2.  Stable sequela of cirrhosis and portal hepatis lymphadenopathy. No discrete hepatic lesions, though note, the lack of intravenous contrast limits evaluation.  3. Delayed excretion of the bilateral kidneys may suggest a component of renal insufficiency.  4. Unchanged borderline enlarged pelvic lymph nodes.  5.  Unchanged bilateral L5 pars defects with minimal anterolisthesis of L5 upon S1 and moderate L5-S1 DDD.  Original Report Authenticated By: Waynard Reeds, M.D.    Impression: Kyle [name] is a pleasant 56 y.o. male w/ hx [name] cirrhosis & esophageal varicies with last banding by Dr Jena Gauss last month.  He presents with acute high-grade small bowel obstruction, acute renal failure & hyperkalemia being managed Dr Caesar Bookman.  RN called MD & pt will be transferred to ICU due to decompensation.  He will be high-risk surgical candidate & it is felt best that he be managed at a tertiary care surgical center given his cirrhosis & multiple co-morbidities.  *Discussed w/Dr Jackie Littlejohn immediately given pt's guarded condition & plan of care is below.  Recommendations: 1. Aggressive treatment for renal failure & hyperkalemia per Dr Caesar Bookman 2. Monitor for signs of variceal bleed as he is at high risk given repetitive vomiting. 3. Next EGD for FU varices end of April 2013 4. Continue PPI  5. Agree w/ zofran for N/V   LOS: 1 day   Lorenza Burton  02/16/2012, 11:13 AM [facility]   Discussed with Dr.  Leticia Penna. Dr. Leticia Penna states he was able to reduce the hernia this morning. Although the patient does still has significant midline tenderness to palpation on my exam, I do not appreciate a mass at this time. Patient now has an NGT which is rapidly returned about 2 L of dark bilious fluid. This maneuver, however, has not significantly help this gentleman's abdominal pain. I agree with Dr. Leticia Penna, this gentleman is significantly behind on his fluids. This will likely go along way to explain his acute renal failure.  As per  Dr. Leticia Penna, Will monitor very closely over the next 24 hours.  If continued clinical deterioration is observed, other diagnostic possibilities, such as ischemia, etc., need to be considered further and exploratory surgery will be needed. I discussed the increased perioperative morbidity and mortality risk with Dr. Leticia Penna. We will follow closely with you.    I have reviewed the CT scan personally Dr. Georgiann Mohs.    I have discussed my findings and recommendations with the patient's wife, Jossiah Smoak in the apices.

## 2012-02-16 NOTE — Progress Notes (Signed)
02/16/12 1212 Patient c/o persistent nausea and vomited small amount of light brown colored emesis this morning about 1035, having dry heaves as well. C/o abdominal discomfort, given dilaudid and zofran as ordered PRN. Lorenza Burton, NP at bedside as well for GI consult. T-98.3, HR - 115, R-20, BP 146/78 manually, O2 sats 91% on 3 lpm. Patient alert and stated "i feel like i'm going to be sick". Discussed with Tommy Rainwater, stated may need transfer to stepdown due to persistent nausea and vomiting episodes. Notified Dr Leticia Penna this morning, orders received to transfer to stepdown. Report given to Kathyrn Sheriff, RN. IV normal saline bolus started and HR 122, R-20, BP 129/76, and O2 sats 89 % on 35% venti mask prior to transfer. Transferred to rm 11 in ICU.

## 2012-02-16 NOTE — Progress Notes (Signed)
Inpatient Diabetes Program Recommendations  AACE/ADA: New Consensus Statement on Inpatient Glycemic Control  Target Ranges:  Prepandial:   less than 140 mg/dL      Peak postprandial:   less than 180 mg/dL (1-2 hours)      Critically ill patients:  140 - 180 mg/dL  Pager:  [phone] Hours:  8 am-10pm   Reason for Visit: Elevated glucose: 248 mg/dL  Inpatient Diabetes Program Recommendations Insulin - Basal: Add half dose of home Lantus:  Begin with Lantus 50 units qhs

## 2012-02-16 NOTE — H&P (Signed)
Kyle [name] is an 56 y.o. male.   Chief Complaint: Nausea vomiting abdominal pain.  HPI: Patient presents with several day history of nausea vomiting and abdominal pain. Nausea and abdominal pain started around the same time progressively getting worse. He has had several episodes of nonbloody emesis over the last 24 hours. The pain is described as diffuse and colicky in nature. He has some tenderness around his umbilicus which is worsened with emesis but no exquisite pain in this area. He has had a known history of a umbilical hernia in the past. His last bowel movement was a couple days ago and was noted to be normal. Patient does not recall any recent flatus. He has not had any associated fevers or chills. No sick contacts. He denies any recent melena, hematochezia, hematemesis. He does have a history of esophageal varices with recent banding in January of this year at [facility] hospital. He has had no lightheadedness or chest pain. No shortness of breath.  Past Medical History  Diagnosis Date  . DM (diabetes mellitus)   . Cirrhosis     bx proven steatohepatitis with cirrhosis (2010); per note from Feb 2011, received Hep A and B vaccines in 2010  . HTN (hypertension)   . RLS (restless legs syndrome)   . Sleep apnea   . Hyperlipidemia   . IDA (iron deficiency anemia)   . GERD (gastroesophageal reflux disease)   . Depression   . Peripheral neuropathy   . Urothelial cancer     2010, paillary low-grade, h/o recurrence 2011  . B12 deficiency   . Psoriasis   . 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 Anemia due to multiple mechanisms 05/13/2011  . History of alcohol abuse 05/13/2011  . ANEMIA-IRON DEFICIENCY 03/09/2009  . S/P endoscopy May 2012    4 columns grade II esophageal varices; due for repeat in Nov 2013   . S/P colonoscopy May 2012    Tubular adenoma  . Low back pain   . Cancer     bladder ca 05/2009 removal and  w/chemo wash  . Esophageal varices with bleeding 11/24/11   s/p emergent EGD 11/25/11 by Dr. Rhea Belton at [facility], Grade III esophageal varices s/p banding X 5    Past Surgical History  Procedure Date  . Carpel tunnel   . Shoulder surgery   . Adrenal mass surgery 05/2009    benign, left  . Bladder surgery 01/2009 and 06/2010    cancer 2010, small recurrence in 06/2010  . Colonoscopy 04/2009    moderate int hemorrhoids, rare sigmoid diverticula, one mm sessile hyperplastic rectal polyp  . Esophagogastroduodenoscopy 03/2009    small hh  . Small bowel capsule endoscopy 03/2009    couple of small benign appearing erosions, nonbleeding  . Egd/tcs 08/2007    small hiatal hernia, pancolonic diverticula, friable anal canal, 3cm salmon colored epithelium in distal esophagus, bx negative for Barrett's  . Skin cancer excision Oct 2012    left arm  . Esophagogastroduodenoscopy 11/25/2011    Procedure: ESOPHAGOGASTRODUODENOSCOPY (EGD);  Surgeon: Erick Blinks, MD;  Location: [facility] ENDOSCOPY;  Service: Gastroenterology;  Laterality: Left;  . Esophagogastroduodenoscopy 01/14/2012    Dr Rourk->4-5 columns Gr2 varices, 6 bands placed, HH, distal esophageal ulcer, portal gastropathy, antral erosions    Family History  Problem Relation Age of Onset  . Cirrhosis Father     etoh  . Colon cancer Neg Hx   . Anesthesia problems Neg Hx   . Hypotension Neg Hx   .  Malignant hyperthermia Neg Hx   . Pseudochol deficiency Neg Hx   . Kidney cancer Mother   . Cancer Mother   . HIV Brother   . Cirrhosis Brother     nash   Social History:  reports that he has been smoking Cigarettes.  He has a 45 pack-year smoking history. He does not have any smokeless tobacco history on file. He reports that he does not drink alcohol or use illicit drugs.  Allergies:  Allergies  Allergen Reactions  . Tylenol (Acetaminophen) Other (See Comments)    Causes legs to "run"    Medications Prior to Admission  Medication Dose Route Frequency Provider Last Rate Last Dose  . 0.9 %  sodium chloride  infusion   Intravenous Continuous Fabio Bering, MD 125 mL/hr at 02/16/12 [phone]    . calcium chloride 350 mg in sodium chloride 0.9 % 100 mL IVPB  350 mg Intravenous Once Fabio Bering, MD   350 mg at 02/16/12 1047  . dextrose 50 % solution 50 mL  1 ampule Intravenous Once Fabio Bering, MD   50 mL at 02/16/12 0848  . dextrose 50 % solution           . enoxaparin (LOVENOX) injection 40 mg  40 mg Subcutaneous Q24H Fabio Bering, MD   40 mg at 02/15/12 1951  . furosemide (LASIX) injection 40 mg  40 mg Intravenous Once Fabio Bering, MD   40 mg at 02/16/12 0848  . HYDROmorphone (DILAUDID) injection 1 mg  1 mg Intravenous Once [name], MD   1 mg at 02/15/12 1253  . HYDROmorphone (DILAUDID) injection 1-2 mg  1-2 mg Intravenous Q4H PRN Fabio Bering, MD   1 mg at 02/16/12 1023  . insulin aspart (novoLOG) injection 0-15 Units  0-15 Units Subcutaneous Q4H Fabio Bering, MD   8 Units at 02/16/12 [phone]  . insulin aspart (novoLOG) injection 10 Units  10 Units Subcutaneous Once [name], MD   10 Units at 02/15/12 1603  . insulin aspart (novoLOG) injection 10 Units  10 Units Intravenous Once Fabio Bering, MD   10 Units at 02/16/12 0850  . iohexol (OMNIPAQUE) 300 MG/ML solution 40 mL  40 mL Oral Once PRN Fabio Bering, MD   40 mL at 02/15/12 1300  . iohexol (OMNIPAQUE) 300 MG/ML solution 80 mL  80 mL Intravenous Once PRN Fabio Bering, MD   80 mL at 02/15/12 1458  . ondansetron (ZOFRAN) injection 4 mg  4 mg Intravenous Once [name], MD   4 mg at 02/15/12 1253  . ondansetron (ZOFRAN) injection 4 mg  4 mg Intravenous Q4H PRN Fabio Bering, MD   4 mg at 02/16/12 1025  . pantoprazole (PROTONIX) injection 40 mg  40 mg Intravenous Q24H Fabio Bering, MD   40 mg at 02/15/12 1741  . promethazine (PHENERGAN) injection 12.5 mg  12.5 mg Intravenous Q4H PRN Fabio Bering, MD   12.5 mg at 02/16/12 0845  . sodium chloride 0.9 % bolus 1,000 mL  1,000 mL Intravenous Once  Fabio Bering, MD   1,000 mL at 02/16/12 0427  . sodium chloride 0.9 % bolus 1,000 mL  1,000 mL Intravenous Once Fabio Bering, MD   1,000 mL at 02/16/12 1132  . sodium chloride 0.9 % bolus 500 mL  500 mL Intravenous Once [name], MD      . sodium chloride 0.9 %  bolus 500 mL  500 mL Intravenous Once [name], MD   500 mL at 02/15/12 1605  . sodium chloride 0.9 % injection           . sodium chloride 0.9 % injection           . sodium chloride 0.9 % injection        10 mL at 02/16/12 0846  . sodium chloride 0.9 % injection        10 mL at 02/16/12 1026  . sodium chloride 0.9 % injection           . sodium chloride 0.9 % injection        3 mL at 02/16/12 1131  . DISCONTD: lactated ringers infusion   Intravenous Continuous Fabio Bering, MD 120 mL/hr at 02/15/12 1724    . DISCONTD: ondansetron (ZOFRAN) injection 4 mg  4 mg Intravenous Q6H PRN Fabio Bering, MD       Medications Prior to Admission  Medication Sig Dispense Refill  . dicyclomine (BENTYL) 10 MG capsule Take 1 capsule (10 mg total) by mouth 4 (four) times daily.  120 capsule  0  . fish oil-omega-3 fatty acids 1000 MG capsule Take 1 g by mouth 3 (three) times daily.       . furosemide (LASIX) 20 MG tablet Take 20 mg by mouth daily.       [name] gabapentin (NEURONTIN) 300 MG capsule Take 1,200 mg by mouth 2 (two) times daily.       . hydrochlorothiazide (MICROZIDE) 12.5 MG capsule Take 12.5 mg by mouth daily.      [name] lisinopril (PRINIVIL,ZESTRIL) 20 MG tablet Take 10 mg by mouth daily.       . metFORMIN (GLUCOPHAGE) 1000 MG tablet Take 1,000 mg by mouth 2 (two) times daily with a meal.        . Multiple Vitamin (MULTIVITAMIN) capsule Take 1 capsule by mouth daily.        [name] omeprazole (PRILOSEC) 40 MG capsule Take 40 mg by mouth daily.      . pantoprazole (PROTONIX) 40 MG tablet Take 1 tablet (40 mg total) by mouth 2 (two) times daily before a meal.  60 tablet  3  . pramipexole (MIRAPEX) 0.5 MG tablet Take 0.5 mg  by mouth at bedtime.       [name] Propranolol HCl (INDERAL LA PO) Take 40 mg by mouth 2 (two) times daily.      . simvastatin (ZOCOR) 20 MG tablet Take 20 mg by mouth every evening.      . traZODone (DESYREL) 50 MG tablet Take 50 mg by mouth at bedtime.      . insulin aspart (NOVOLOG) 100 UNIT/ML injection Inject 20 Units into the skin 2 (two) times daily. Sliding Scale      . insulin glargine (LANTUS) 100 UNIT/ML injection Inject 100 Units into the skin at bedtime.         Results for orders placed during the hospital encounter of 02/15/12 (from the past 48 hour(s))  CBC     Status: Abnormal   Collection Time   02/15/12 11:48 AM      Component Value Range Comment   WBC 11.3 (*) 4.0 - 10.5 (K/uL)    RBC 4.89  4.22 - 5.81 (MIL/uL)    Hemoglobin 15.0  13.0 - 17.0 (g/dL)    HCT 08.6  57.8 - 46.9 (%)    MCV 93.0  78.0 - 100.0 (fL)  MCH 30.7  26.0 - 34.0 (pg)    MCHC 33.0  30.0 - 36.0 (g/dL)    RDW 16.1 (*) 09.6 - 15.5 (%)    Platelets 100 (*) 150 - 400 (K/uL)   DIFFERENTIAL     Status: Abnormal   Collection Time   02/15/12 11:48 AM      Component Value Range Comment   Neutrophils Relative 76  43 - 77 (%)    Neutro Abs 8.6 (*) 1.7 - 7.7 (K/uL)    Lymphocytes Relative 17  12 - 46 (%)    Lymphs Abs 1.9  0.7 - 4.0 (K/uL)    Monocytes Relative 7  3 - 12 (%)    Monocytes Absolute 0.8  0.1 - 1.0 (K/uL)    Eosinophils Relative 1  0 - 5 (%)    Eosinophils Absolute 0.1  0.0 - 0.7 (K/uL)    Basophils Relative 0  0 - 1 (%)    Basophils Absolute 0.0  0.0 - 0.1 (K/uL)   PROTIME-INR     Status: Abnormal   Collection Time   02/15/12 11:48 AM      Component Value Range Comment   Prothrombin Time 16.2 (*) 11.6 - 15.2 (seconds)    INR 1.27  0.00 - 1.49    COMPREHENSIVE METABOLIC PANEL     Status: Abnormal   Collection Time   02/15/12 11:48 AM      Component Value Range Comment   Sodium 131 (*) 135 - 145 (mEq/L)    Potassium 5.4 (*) 3.5 - 5.1 (mEq/L)    Chloride 91 (*) 96 - 112 (mEq/L)    CO2 22   19 - 32 (mEq/L)    Glucose, Bld 352 (*) 70 - 99 (mg/dL)    BUN 30 (*) 6 - 23 (mg/dL)    Creatinine, Ser 0.45 (*) 0.50 - 1.35 (mg/dL)    Calcium 40.9  8.4 - 10.5 (mg/dL)    Total Protein 9.0 (*) 6.0 - 8.3 (g/dL)    Albumin 4.0  3.5 - 5.2 (g/dL)    AST 50 (*) 0 - 37 (U/L)    ALT 60 (*) 0 - 53 (U/L)    Alkaline Phosphatase 62  39 - 117 (U/L)    Total Bilirubin 1.8 (*) 0.3 - 1.2 (mg/dL)    GFR calc non Af Amer 45 (*) >90 (mL/min)    GFR calc Af Amer 52 (*) >90 (mL/min)   TYPE AND SCREEN     Status: Normal   Collection Time   02/15/12 11:50 AM      Component Value Range Comment   ABO/RH(D) O POS      Antibody Screen NEG      Sample Expiration 02/18/2012     GLUCOSE, CAPILLARY     Status: Abnormal   Collection Time   02/15/12 11:55 AM      Component Value Range Comment   Glucose-Capillary 318 (*) 70 - 99 (mg/dL)   GLUCOSE, CAPILLARY     Status: Abnormal   Collection Time   02/15/12  8:25 PM      Component Value Range Comment   Glucose-Capillary 239 (*) 70 - 99 (mg/dL)   GLUCOSE, CAPILLARY     Status: Abnormal   Collection Time   02/16/12  1:47 AM      Component Value Range Comment   Glucose-Capillary 243 (*) 70 - 99 (mg/dL)   CBC     Status: Abnormal   Collection Time   02/16/12  5:08  AM      Component Value Range Comment   WBC 8.9  4.0 - 10.5 (K/uL)    RBC 4.70  4.22 - 5.81 (MIL/uL)    Hemoglobin 14.1  13.0 - 17.0 (g/dL)    HCT 82.9  56.2 - 13.0 (%)    MCV 93.6  78.0 - 100.0 (fL)    MCH 30.0  26.0 - 34.0 (pg)    MCHC 32.0  30.0 - 36.0 (g/dL)    RDW 86.5 (*) 78.4 - 15.5 (%)    Platelets 111 (*) 150 - 400 (K/uL)   COMPREHENSIVE METABOLIC PANEL     Status: Abnormal   Collection Time   02/16/12  5:08 AM      Component Value Range Comment   Sodium 133 (*) 135 - 145 (mEq/L)    Potassium 6.2 (*) 3.5 - 5.1 (mEq/L)    Chloride 94 (*) 96 - 112 (mEq/L)    CO2 22  19 - 32 (mEq/L)    Glucose, Bld 248 (*) 70 - 99 (mg/dL)    BUN 41 (*) 6 - 23 (mg/dL)    Creatinine, Ser 6.96 (*) 0.50 -  1.35 (mg/dL) DELTA CHECK NOTED   Calcium 9.3  8.4 - 10.5 (mg/dL)    Total Protein 8.5 (*) 6.0 - 8.3 (g/dL)    Albumin 3.7  3.5 - 5.2 (g/dL)    AST 31  0 - 37 (U/L)    ALT 48  0 - 53 (U/L)    Alkaline Phosphatase 56  39 - 117 (U/L)    Total Bilirubin 1.2  0.3 - 1.2 (mg/dL)    GFR calc non Af Amer 21 (*) >90 (mL/min)    GFR calc Af Amer 24 (*) >90 (mL/min)   DIFFERENTIAL     Status: Abnormal   Collection Time   02/16/12  5:08 AM      Component Value Range Comment   Neutrophils Relative 66  43 - 77 (%)    Neutro Abs 5.9  1.7 - 7.7 (K/uL)    Lymphocytes Relative 19  12 - 46 (%)    Lymphs Abs 1.7  0.7 - 4.0 (K/uL)    Monocytes Relative 14 (*) 3 - 12 (%)    Monocytes Absolute 1.3 (*) 0.1 - 1.0 (K/uL)    Eosinophils Relative 1  0 - 5 (%)    Eosinophils Absolute 0.1  0.0 - 0.7 (K/uL)    Basophils Relative 0  0 - 1 (%)    Basophils Absolute 0.0  0.0 - 0.1 (K/uL)   GLUCOSE, CAPILLARY     Status: Abnormal   Collection Time   02/16/12  8:30 AM      Component Value Range Comment   Glucose-Capillary 263 (*) 70 - 99 (mg/dL)    Comment 1 Notify RN     GLUCOSE, CAPILLARY     Status: Abnormal   Collection Time   02/16/12 11:05 AM      Component Value Range Comment   Glucose-Capillary 265 (*) 70 - 99 (mg/dL)    Comment 1 Notify RN      Ct Abdomen Pelvis W Contrast  02/15/2012  *RADIOLOGY REPORT*  Clinical Data: Mid to lower abdominal pain, nausea and vomiting, history of cirrhosis and bladder cancer; post left adrenal gland surgery.  CT ABDOMEN AND PELVIS WITH CONTRAST  Technique:  Multidetector CT imaging of the abdomen and pelvis was performed following the standard protocol during bolus administration of intravenous contrast.  Contrast:  80 ml Omnipaque-300  Comparison: CT abdomen pelvis - 11/13/2011  Findings:  Nodular hepatic contour with borderline splenomegaly hypertrophy of multiple venous collaterals within the upper abdomen compatible with provided history of cirrhosis.  No ascites.  Portal  vein remains patent.  There is symmetric enhancement of the bilateral kidneys.  There is delayed excretion from the bilateral kidneys.  The left kidney is again noted to have a slightly anteriorly rotated lie, likely secondary to enlargement of the spleen.  No urinary obstruction. Normal pancreas.  The right adrenal gland appears normal.  The left adrenal gland is not definitively identified compatible with provided history.  There is a small bowel obstruction secondary to a midline ventral anterior abdominal wall hernia superior to the umbilicus(axial image 55, series 2, sagittal image 68, series 5). There is mild bowel wall thickening with an adjacent loop of small bowel (axial image 65, series 2) with upstream dilatation of multiple loops of small bowel.  There is a minimal amount of fluid adjacent to the herniated loop of bowel.  The distal small bowel and colon are decompressed.  No pneumoperitoneum, pneumatosis or portal venous gas.  Scattered colonic diverticulosis without evidence of diverticulitis.  Scattered atherosclerotic calcifications and nonaneurysmal abdominal aorta. The lack of a venous contrast and 2 mm evaluate the branch vessels of the abdominal aorta and bearing parity. Redemonstrated port hepatis adenopathy within the porta hepatis with index port hepatis lymph node measuring 1.8 cm in short axis diameter (image 34). Unchanged borderline enlarged right pelvic sidewall lymph node measuring 1 cm in short axis diameter (image 79).  Limited visualization of the lower thorax demonstrates bibasilar dependent atelectasis, left greater than right.  No focal airspace opacities.  No pleural effusion or pneumothorax.  Normal heart size.  No pericardial effusion.  No acute or aggressive osseous abnormalities.  Minimal (2-3 mm) of anterolisthesis of L5 upon S1 with associated bilateral pars L5 pars defects.  This is associated with mild to moderate DDD at L5 - S1. Unchanged mild (under 25%) anterior  compression deformity of several mid thoracic vertebral bodies.  IMPRESSION:  1.  High-grade small bowel obstruction secondary to a ventral anterior abdominal wall hernia just superior to the umbilicus. This finding is associated a minimal amount of fluid adjacent to the herniated loop of bowel as well as minimal bowel wall thickening within the adjacent loop of small bowel.  2.  Stable sequela of cirrhosis and portal hepatis lymphadenopathy. No discrete hepatic lesions, though note, the lack of intravenous contrast limits evaluation.  3. Delayed excretion of the bilateral kidneys may suggest a component of renal insufficiency.  4. Unchanged borderline enlarged pelvic lymph nodes.  5.  Unchanged bilateral L5 pars defects with minimal anterolisthesis of L5 upon S1 and moderate L5-S1 DDD.  Original Report Authenticated By: Waynard Reeds, M.D.    Review of Systems  Constitutional: Negative for fever and chills.  HENT: Negative.   Eyes: Negative.   Respiratory: Negative.   Gastrointestinal: Positive for heartburn, nausea, vomiting and abdominal pain. Negative for diarrhea, constipation, blood in stool and melena.  Genitourinary: Negative for dysuria and hematuria.  Musculoskeletal: Negative.   Skin: Negative.   Neurological: Negative.  Negative for weakness.  Endo/Heme/Allergies: Negative.   Psychiatric/Behavioral: Negative.     Blood pressure 146/78, pulse 115, temperature 98.3 F (36.8 C), temperature source Oral, resp. rate 20, height 5\' 7"  (1.702 m), weight 106.8 kg (235 lb 7.2 oz), SpO2 91.00%. Physical Exam  Constitutional: He is oriented to person, place, and time.  No distress.       Moderately disheveled.  Appears uncomfortable.  NAD  HENT:  Head: Normocephalic and atraumatic.  Eyes: Conjunctivae and EOM are normal. Pupils are equal, round, and reactive to light. No scleral icterus.  Neck: Normal range of motion. No tracheal deviation present. No thyromegaly present.  Cardiovascular:  Regular rhythm.        Tachycardic   Respiratory: Effort normal and breath sounds normal. No stridor. No respiratory distress. He has no wheezes.  GI: Soft. He exhibits distension. He exhibits no mass. There is tenderness (mild to moderate diffuse tenderness.  No diffuse peritoneal signs.  + umbilical hernia.  Mild tenderness around site.  No erythema). There is no rebound and no guarding.       Protuberant abdomen.  Musculoskeletal: Normal range of motion.  Lymphadenopathy:    He has no cervical adenopathy.  Neurological: He is alert and oriented to person, place, and time.  Skin: Skin is warm and dry.     Assessment/Plan Small bowel obstruction, cirrhosis, history of bladder cancer, sleep apnea, hypertension. Patient will be admitted and resuscitated with IV fluid hydration. Antiemetics will be administered. With this history of recent banding of his esophageal varices nasogastric decompression will be held for this time. Should he have continued persistent nausea and vomiting and decompression has been discussed with the patient and placement of a nasogastric tube discussed. At this point there is no evidence of any active GI bleeding. Patient does demonstrate evidence for acute dehydration likely secondarily to his continued emesis. Renal function will be monitored closely. Hyperkalemia will be addressed. Due to his history of liver disease and his scheduled appointment to see Dr. Jena Gauss tomorrow, GI consultation will be obtained. Patient is high risk for surgical intervention at this time and will continue to attempt to resuscitate patient adequately for any surgical considerations are made. This has been discussed with the patient.   Bayler Gehrig C 02/16/2012, 11:36 AM

## 2012-02-17 ENCOUNTER — Ambulatory Visit: Payer: Medicaid Other | Admitting: Gastroenterology

## 2012-02-17 LAB — COMPREHENSIVE METABOLIC PANEL
ALT: 35 U/L (ref 0–53)
AST: 25 U/L (ref 0–37)
Alkaline Phosphatase: 49 U/L (ref 39–117)
CO2: 18 mEq/L — ABNORMAL LOW (ref 19–32)
Chloride: 107 mEq/L (ref 96–112)
GFR calc Af Amer: 26 mL/min — ABNORMAL LOW (ref >90)
GFR calc non Af Amer: 23 mL/min — ABNORMAL LOW (ref >90)
Glucose, Bld: 177 mg/dL — ABNORMAL HIGH (ref 70–99)
Potassium: 4.5 mEq/L (ref 3.5–5.1)
Sodium: 142 mEq/L (ref 135–145)
Total Bilirubin: 1.1 mg/dL (ref 0.3–1.2)

## 2012-02-17 LAB — GLUCOSE, CAPILLARY
Glucose-Capillary: 156 mg/dL — ABNORMAL HIGH (ref 70–99)
Glucose-Capillary: 188 mg/dL — ABNORMAL HIGH (ref 70–99)
Glucose-Capillary: 193 mg/dL — ABNORMAL HIGH (ref 70–99)
Glucose-Capillary: 222 mg/dL — ABNORMAL HIGH (ref 70–99)

## 2012-02-17 LAB — CBC
Hemoglobin: 12.9 g/dL — ABNORMAL LOW (ref 13.0–17.0)
MCH: 30.2 pg (ref 26.0–34.0)
Platelets: 77 10*3/uL — ABNORMAL LOW (ref 150–400)
RBC: 4.27 MIL/uL (ref 4.22–5.81)
WBC: 7.2 10*3/uL (ref 4.0–10.5)

## 2012-02-17 MED ORDER — PRAMIPEXOLE DIHYDROCHLORIDE 1 MG PO TABS
0.5000 mg | ORAL_TABLET | Freq: Once | ORAL | Status: AC
  Administered 2012-02-17: 0.5 mg via ORAL
  Filled 2012-02-17: qty 1

## 2012-02-17 MED ORDER — TRAZODONE HCL 50 MG PO TABS
50.0000 mg | ORAL_TABLET | Freq: Once | ORAL | Status: AC
  Administered 2012-02-17: 50 mg via ORAL
  Filled 2012-02-17: qty 1

## 2012-02-17 MED ORDER — TRAZODONE HCL 50 MG PO TABS: 50.0000 mg | ORAL_TABLET | Freq: Every day | ORAL | Status: DC

## 2012-02-17 MED ORDER — CHLORHEXIDINE GLUCONATE 0.12 % MT SOLN
15.0000 mL | Freq: Two times a day (BID) | OROMUCOSAL | Status: DC
  Administered 2012-02-17 – 2012-02-18 (×2): 15 mL via OROMUCOSAL
  Filled 2012-02-17 (×2): qty 15

## 2012-02-17 MED ORDER — PRAMIPEXOLE DIHYDROCHLORIDE 1 MG PO TABS: 0.5000 mg | ORAL_TABLET | Freq: Every day | ORAL | Status: DC

## 2012-02-17 NOTE — Progress Notes (Signed)
REVIEWED.  

## 2012-02-17 NOTE — Progress Notes (Signed)
   CARE MANAGEMENT NOTE 02/17/2012  Patient:  Kyle [name], Kyle [name]   Account Number:  [id number]  Date Initiated:  02/17/2012  Documentation initiated by:  Sharrie Rothman  Subjective/Objective Assessment:   Pt admitted from home with wife. Pt admitted for vomiting related to SBO. Surgery not needed at this time. Pt does have NG at this time.     Action/Plan:   CM spoke with pt and no CM needs noted at this time. Will follow.   Anticipated DC Date:  02/24/2012   Anticipated DC Plan:  HOME/SELF CARE      DC Planning Services  CM consult      Choice offered to / List presented to:             Status of service:  In process, will continue to follow Medicare Important Message given?   (If response is "NO", the following Medicare IM given date fields will be blank) Date Medicare IM given:   Date Additional Medicare IM given:    Discharge Disposition:  HOME/SELF CARE  Per UR Regulation:    If discussed at Long Length of Stay Meetings, dates discussed:    Comments:  02/17/12 1620 Arlyss Queen, RN BSN CM Pt admitted with SBO. Pt does have Ng at this time. No surgery needed at this time. No CM needs noted. Will follow.

## 2012-02-17 NOTE — Progress Notes (Signed)
Subjective: Pt continues to c/o 9/10 generalized abdominal pain.  NGT w/ scant dark gastric contents.  3 loose stools this AM.  No rectal bleeding or melena.  Objective: Vital signs in last 24 hours: Temp:  [98.3 F (36.8 C)-99.5 F (37.5 C)] 98.5 F (36.9 C) (03/26 0800) Pulse Rate:  [97-122] 107  (03/26 0500) Resp:  [15-24] 21  (03/26 0500) BP: (78-146)/(40-85) 109/81 mmHg (03/26 0500) SpO2:  [89 %-96 %] 94 % (03/26 0500) FiO2 (%):  [35 %-50 %] 50 % (03/26 0500) Weight:  [229 lb 15 oz (104.3 kg)] 229 lb 15 oz (104.3 kg) (03/26 0500) Last BM Date: 02/16/12 General:   Alert,  Well-developed, well-nourished, pleasant and cooperative in NAD Eyes:  Sclera clear, no icterus.   Conjunctiva pink. Mouth: + dry.  NGT intact.  Scant dark brown gastric contents in container. Heart:  +tachycardia.  Rate 110 Abdomen:  Decreased bowel sounds.  Moderately distended.  Non-tender.  Extremities:  Trace bilat LEE Neurologic:  Alert and  oriented x4;  grossly normal neurologically. Psych:  Alert and cooperative. Normal mood and affect. Anxiety improved since yesterday.  Intake/Output from previous day: 03/25 0701 - 03/26 0700 In: 4414.7 [I.V.:3328; IV Piggyback:1086.7] Out: 2302 [Urine:300; Emesis/NG output:2000; Stool:2]  Lab Results:  [facility] Show Low 02/17/12 0433 02/16/12 0508 02/15/12 1148  WBC 7.2 8.9 11.3*  HGB 12.9* 14.1 15.0  HCT 40.2 44.0 45.5  PLT 77* 111* 100*   BMET  Basename 02/17/12 0433 02/16/12 1212 02/16/12 0508  NA 142 136 133*  K 4.5 5.6* 6.2*  CL 107 96 94*  CO2 18* 21 22  GLUCOSE 177* 248* 248*  BUN 61* 49* 41*  CREATININE 2.94* 3.35* 3.16*  CALCIUM 9.0 9.7 9.3   LFT  Basename 02/17/12 0433 02/16/12 1212 02/16/12 0508  PROT 7.7 8.8* 8.5*  ALBUMIN 3.3* 3.8 3.7  AST 25 31 31   ALT 35 47 48  ALKPHOS 49 60 56  BILITOT 1.1 1.2 1.2  BILIDIR -- -- --  IBILI -- -- --  LIPASE -- -- --  AMYLASE -- -- --   PT/INR  Basename 02/15/12 1148  LABPROT 16.2*  INR 1.27    Assessment: High-grade SBO secondary to umbilical hernia:  Being followed by Dr Caesar Bookman, attempted manual reduction yesterday.   [name] cirrhosis & Esophageal varices:  Stable.  No acute bleeding. Hyperkalemia: Resolved Acute Renal Insufficiency:  Improving  Plan: Will follow peripherally Management per surgery   LOS: 2 days   Kyle [name]  02/17/2012, 10:06 AM

## 2012-02-17 NOTE — Progress Notes (Signed)
  Subjective: Nausea better.  + BM.  No fevers or chills.  Abdominal systems improving.  Objective: Vital signs in last 24 hours: Temp:  [98.3 F (36.8 C)-99.5 F (37.5 C)] 98.3 F (36.8 C) (03/26 1200) Pulse Rate:  [97-113] 107  (03/26 0500) Resp:  [15-24] 21  (03/26 0500) BP: (78-141)/(40-85) 109/81 mmHg (03/26 0500) SpO2:  [91 %-95 %] 94 % (03/26 0500) FiO2 (%):  [50 %] 50 % (03/26 0500) Weight:  [104.3 kg (229 lb 15 oz)] 104.3 kg (229 lb 15 oz) (03/26 0500) Last BM Date: 02/16/12  Intake/Output from previous day: 03/25 0701 - 03/26 0700 In: 4414.7 [I.V.:3328; IV Piggyback:1086.7] Out: 2302 [Urine:300; Emesis/NG output:2000; Stool:2] Intake/Output this shift: Total I/O In: -  Out: 600 [Urine:600]  General appearance: alert and no distress GI: +BS, soft, non-tender.  Hernia reduced.  Lab Results:   [facility] 02/17/12 0433 02/16/12 0508  WBC 7.2 8.9  HGB 12.9* 14.1  HCT 40.2 44.0  PLT 77* 111*   BMET  Basename 02/17/12 0433 02/16/12 1212  NA 142 136  K 4.5 5.6*  CL 107 96  CO2 18* 21  GLUCOSE 177* 248*  BUN 61* 49*  CREATININE 2.94* 3.35*  CALCIUM 9.0 9.7   PT/INR  Basename 02/15/12 1148  LABPROT 16.2*  INR 1.27   ABG No results found for this basename: PHART:2,PCO2:2,PO2:2,HCO3:2 in the last 72 hours  Studies/Results: No results found.  Anti-infectives: Anti-infectives    None      Assessment/Plan: s/p * No surgery found * SBO, improving.  Patient doing much better today.  Renal function improving.  Start clears. D/c NG if tolerates clears.  LOS: 2 days    Annely Sliva C 02/17/2012

## 2012-02-18 LAB — COMPREHENSIVE METABOLIC PANEL
ALT: 29 U/L (ref 0–53)
AST: 25 U/L (ref 0–37)
Alkaline Phosphatase: 51 U/L (ref 39–117)
CO2: 21 mEq/L (ref 19–32)
Calcium: 9.2 mg/dL (ref 8.4–10.5)
Chloride: 104 mEq/L (ref 96–112)
GFR calc Af Amer: 77 mL/min — ABNORMAL LOW (ref >90)
GFR calc non Af Amer: 66 mL/min — ABNORMAL LOW (ref >90)
Glucose, Bld: 194 mg/dL — ABNORMAL HIGH (ref 70–99)
Sodium: 137 mEq/L (ref 135–145)
Total Bilirubin: 1.7 mg/dL — ABNORMAL HIGH (ref 0.3–1.2)

## 2012-02-18 LAB — GLUCOSE, CAPILLARY
Glucose-Capillary: 159 mg/dL — ABNORMAL HIGH (ref 70–99)
Glucose-Capillary: 197 mg/dL — ABNORMAL HIGH (ref 70–99)

## 2012-02-18 LAB — CBC
HCT: 38.8 % — ABNORMAL LOW (ref 39.0–52.0)
Hemoglobin: 12.6 g/dL — ABNORMAL LOW (ref 13.0–17.0)
MCH: 30.1 pg (ref 26.0–34.0)
MCHC: 32.5 g/dL (ref 30.0–36.0)
RBC: 4.19 MIL/uL — ABNORMAL LOW (ref 4.22–5.81)

## 2012-02-18 NOTE — Discharge Summary (Signed)
Physician Discharge Summary  Patient ID: Kyle [name] MRN: [id number] DOB/AGE: 55-Dec-1957 55 y.o.  Admit date: 02/15/2012 Discharge date: 02/18/2012  Admission Diagnoses: Small bowel obstruction, cirrhosis, acute renal insufficiency  Discharge Diagnoses: The same Active Problems:  * No active hospital problems. *    Discharged Condition: stable  Hospital Course: Patient presented to [facility] emergency department with progressive abdominal pain nausea and vomiting. Workup and evaluation was suspicious for small bowel obstruction. He was admitted for continued management. Due to his history of varices initial nasogastric tube was not placed however as patient continued to have issues with persistent nausea vomiting I did eventually place a nasogastric tube myself. This was a large amount of foul-smelling enteric fluid being withdrawn. Additionally patient did have a small ventral abdominal wall hernia which was reduced. Whether the hernia was the access site of incarceration/bowel obstruction or whether another source of obstruction, spaces initial presentation his symptomatology did improve. He was aggressively rehydrated with good response from a renal standpoint. At this point he has been advanced to a soft diet which she is tolerating well. His admission symptomatology has resolved. His cirrhosis is stable per GI consult. Plans were made for discharge.  Consults: GI  Significant Diagnostic Studies: labs: CMET daily.  CT abdomen and pelvis  Treatments: IV hydration  Discharge Exam: Blood pressure 132/87, pulse 97, temperature 98.9 F (37.2 C), temperature source Oral, resp. rate 22, height 5\' 7"  (1.702 m), weight 103.8 kg (228 lb 13.4 oz), SpO2 94.00%. General appearance: alert and no distress Eyes: Pupils equal round reactive extraocular movements are intact the Resp: clear to auscultation bilaterally Cardio: regular rate and rhythm GI: soft, non-tender; bowel sounds normal; no  masses,  no organomegaly  Disposition: 01-Home or Self Care  Discharge Orders    Future Appointments: Provider: Department: Dept Phone: Center:   03/05/2012 10:00 AM [facility] [phone] None   04/30/2012 10:20 AM [facility] [phone] None   05/07/2012 11:30 AM Ellouise Newer, PA [facility] [phone] None     Future Orders Please Complete By Expires   Diet - low sodium heart healthy      Increase activity slowly      Discharge instructions      Comments:   Low residual diet.  Increase activity as tolerated.   Call MD for:  temperature >100.4      Call MD for:  persistant nausea and vomiting      Call MD for:  severe uncontrolled pain      Call MD for:  redness, tenderness, or signs of infection (pain, swelling, redness, odor or green/yellow discharge around incision site)        Medication List  As of 02/18/2012 12:15 PM   TAKE these medications         dicyclomine 10 MG capsule   Commonly known as: BENTYL   Take 1 capsule (10 mg total) by mouth 4 (four) times daily.      fish oil-omega-3 fatty acids 1000 MG capsule   Take 1 g by mouth 3 (three) times daily.      furosemide 20 MG tablet   Commonly known as: LASIX   Take 20 mg by mouth daily.      gabapentin 300 MG capsule   Commonly known as: NEURONTIN   Take 1,200 mg by mouth 2 (two) times daily.      hydrochlorothiazide 12.5 MG capsule   Commonly known as: MICROZIDE   Take 12.5  mg by mouth daily.      INDERAL LA PO   Take 40 mg by mouth 2 (two) times daily.      insulin aspart 100 UNIT/ML injection   Commonly known as: novoLOG   Inject 20 Units into the skin 2 (two) times daily. Sliding Scale      insulin glargine 100 UNIT/ML injection   Commonly known as: LANTUS   Inject 100 Units into the skin at bedtime.      lisinopril 20 MG tablet   Commonly known as: PRINIVIL,ZESTRIL   Take 10 mg by mouth daily.      metFORMIN 1000 MG tablet   Commonly known as:  GLUCOPHAGE   Take 1,000 mg by mouth 2 (two) times daily with a meal.      multivitamin capsule   Take 1 capsule by mouth daily.      omeprazole 40 MG capsule   Commonly known as: PRILOSEC   Take 40 mg by mouth daily.      pantoprazole 40 MG tablet   Commonly known as: PROTONIX   Take 1 tablet (40 mg total) by mouth 2 (two) times daily before a meal.      pramipexole 0.5 MG tablet   Commonly known as: MIRAPEX   Take 0.5 mg by mouth at bedtime.      simvastatin 20 MG tablet   Commonly known as: ZOCOR   Take 20 mg by mouth every evening.      traZODone 50 MG tablet   Commonly known as: DESYREL   Take 50 mg by mouth at bedtime.           Follow-up Information    Follow up with Redmond Baseman, MD in 3 weeks.      Follow up with [name], MD in 2 weeks.   Contact information:   [address] [place] [address] [phone]       Follow up with Fabio Bering, MD. (As needed)    Contact information:   [address] [place] [address] [phone]          Signed: Fabio Bering 02/18/2012, 12:15 PM

## 2012-02-18 NOTE — Progress Notes (Signed)
Pt is alert and oriented; family at bedside.  Pt indicates that he is not in any pain, he ate his lunch and is not complaining of any nausea.  Reviewed discharge instructions with pt and his wife and instructed them of Dr. Illene Regulus order to return for s/s indicated on the discharge summary.  Both verbalized understanding of instructions.  Pt taken downstairs in a wheelchair to awaiting car.

## 2012-02-18 NOTE — Progress Notes (Signed)
UR Chart Review Completed  

## 2012-02-18 NOTE — Progress Notes (Signed)
Pt very restless and anxious, moving non-stop in bed, flipping over back and forth, trying to get up out of bed and becoming confused.  Pt requested med he usually takes for his restless leg syndrome.  Mirapex for his RLS and Trazodone for sleep are what pt takes at home but neither had been ordered.  Paged Dr. Leticia Penna at [phone] at 2210pm, 2220pm, and 2230pm with no response so notified Elink MD who ordered pt's Mirapex and Trazodone.  Gave meds to pt at 2245pm and finally around 2330pm, pt became less anxious and fell asleep.  Pt's brother in room during this time and left when pt went to sleep.

## 2012-03-03 ENCOUNTER — Encounter: Payer: Self-pay | Admitting: Urgent Care

## 2012-03-03 ENCOUNTER — Ambulatory Visit (INDEPENDENT_AMBULATORY_CARE_PROVIDER_SITE_OTHER): Payer: Medicaid Other | Admitting: Urgent Care

## 2012-03-03 VITALS — BP 114/76 | HR 78 | Temp 98.0°F | Ht 67.0 in | Wt 233.4 lb

## 2012-03-03 DIAGNOSIS — F1011 Alcohol abuse, in remission: Secondary | ICD-10-CM

## 2012-03-03 DIAGNOSIS — R109 Unspecified abdominal pain: Secondary | ICD-10-CM

## 2012-03-03 DIAGNOSIS — K429 Umbilical hernia without obstruction or gangrene: Secondary | ICD-10-CM | POA: Insufficient documentation

## 2012-03-03 DIAGNOSIS — I8500 Esophageal varices without bleeding: Secondary | ICD-10-CM

## 2012-03-03 DIAGNOSIS — K7460 Unspecified cirrhosis of liver: Secondary | ICD-10-CM

## 2012-03-03 DIAGNOSIS — Z8719 Personal history of other diseases of the digestive system: Secondary | ICD-10-CM | POA: Insufficient documentation

## 2012-03-03 NOTE — Patient Instructions (Signed)
You will need an EGD and possible banding of your esophageal varices with Dr. Jena Gauss the end of this month or first of May Take half Lantus (30 units) the night before your procedure.  Hold morning Lantus the day of your procedure. Hold metformin the night before and morning of your procedure Hold slide scale insulin the night before your procedure and morning of your procedure. Bring all your medications and insulin to the hospital the day of your procedure. Continue Inderal 40 mg twice daily Continue omeprazole 40 mg daily If you develop severe abdominal pain at the site of your hernia and you are not able to reduce it yourself, you should go directly to the emergency room or call EMS

## 2012-03-03 NOTE — Assessment & Plan Note (Signed)
See SBO 

## 2012-03-03 NOTE — Assessment & Plan Note (Addendum)
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 Last EGD with banding by Dr. Jena Gauss last month. He is due for repeat EGD with possible banding.I have discussed risks & benefits which include, but are not limited to, bleeding, infection, perforation & drug reaction.  The patient agrees with this plan & written consent will be obtained.    Take half Lantus (30 units) the night before your procedure.  Hold morning Lantus the day of your procedure. Hold metformin the night before and morning of your procedure Hold slide scale insulin the night before your procedure and morning of your procedure. Bring all your medications and insulin to the hospital the day of your procedure. Continue Inderal 40 mg twice daily Continue omeprazole 40 mg daily

## 2012-03-03 NOTE — Progress Notes (Signed)
Faxed to PCP

## 2012-03-03 NOTE — Assessment & Plan Note (Signed)
-   see cirrhosis 

## 2012-03-03 NOTE — Progress Notes (Signed)
Primary Care Physician:  WONG,FRANCIS PATRICK, MD, MD Primary Gastroenterologist:  Dr. Rourk General Surgeon: Dr. Ziegler   Chief Complaint  Patient presents with  . Follow-up    2 week hospital follow up   HPI:  Kyle [name] is a 55 y.o. male here for followup hospitalization from 02/15/2012 to 02/18/2012 at [facility] for high-grade small bowel obstruction secondary to ventral anterior abdominal wall hernia just superior to umbilicus manually reduced by Dr. Ziegler, hyperkalemia, acute renal failure, and [name] cirrhosis.   Kyle [name] rapidly improved with initiation of aggressive fluids, NG tube, and manual reduction of his hernia by Dr. Ziegler. Due to increased risk of perioperative morbidity and or mortality, Dr. Rourk have recommended for sure referral for consideration of herniorrhaphy. Kyle [name] has continued to have daily pain at hernia site.  The pain is intermittent and worse with eating. He complains of feeling gassy and bloated.  At times he can't sleep.  The pain generally lasts for a few hours and will resolve.  He can usually relieve the pain by manually reducing the hernia himself and he feels much better.   Denies heartburn, indigestion, nausea, vomiting, dysphagia, odynophagia or anorexia. Denies constipation, diarrhea, rectal bleeding, melena or weight loss.  Denies fever or chills. Past Medical History  Diagnosis Date  . DM (diabetes mellitus)   . Cirrhosis     bx proven steatohepatitis with cirrhosis (2010); per note from Feb 2011, received Hep A and B vaccines in 2010  . HTN (hypertension)   . RLS (restless legs syndrome)   . Sleep apnea   . Hyperlipidemia   . IDA (iron deficiency anemia)   . GERD (gastroesophageal reflux disease)   . Depression   . Peripheral neuropathy   . Urothelial cancer     2010, paillary low-grade, h/o recurrence 2011  . B12 deficiency   . Psoriasis   . 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 Anemia due to multiple mechanisms  05/13/2011  . History of alcohol abuse 05/13/2011  . ANEMIA-IRON DEFICIENCY 03/09/2009  . S/P endoscopy May 2012    4 columns grade II esophageal varices; due for repeat in Nov 2013   . S/P colonoscopy May 2012    Tubular adenoma  . Low back pain   . Cancer     bladder ca 05/2009 removal and  w/chemo wash  . Esophageal varices with bleeding 11/24/11    s/p emergent EGD 11/25/11 by Dr. Pyrtle at [facility], Grade III esophageal varices s/p banding X 5  . Small bowel obstruction 02/15/2012    Admitted to [facility], managed by Dr. Ziegler, ventral hernia manually reduced  . Ventral hernia 02/15/12    Past Surgical History  Procedure Date  . Carpel tunnel   . Shoulder surgery   . Adrenal mass surgery 05/2009    benign, left  . Bladder surgery 01/2009 and 06/2010    cancer 2010, small recurrence in 06/2010  . Colonoscopy 04/2009    moderate int hemorrhoids, rare sigmoid diverticula, one mm sessile hyperplastic rectal polyp  . Esophagogastroduodenoscopy 03/2009    small hh  . Small bowel capsule endoscopy 03/2009    couple of small benign appearing erosions, nonbleeding  . Egd/tcs 08/2007    small hiatal hernia, pancolonic diverticula, friable anal canal, 3cm salmon colored epithelium in distal esophagus, bx negative for Barrett's  . Skin cancer excision Oct 2012    left arm  . Esophagogastroduodenoscopy 11/25/2011    Procedure: ESOPHAGOGASTRODUODENOSCOPY (EGD);  Surgeon: Jay Pyrtle,   MD;  Location: [facility];  Service: Gastroenterology;  Laterality: Left;  . Esophagogastroduodenoscopy 01/14/2012    Dr Rourk->4-5 columns Gr2 varices, 6 bands placed, HH, distal esophageal ulcer, portal gastropathy, antral erosions    Current Outpatient Prescriptions  Medication Sig Dispense Refill  . dicyclomine (BENTYL) 10 MG capsule Take 1 capsule (10 mg total) by mouth 4 (four) times daily.  120 capsule  0  . fish oil-omega-3 fatty acids 1000 MG capsule Take 1 g by mouth 3 (three) times daily.       . furosemide (LASIX)  20 MG tablet Take 20 mg by mouth daily.       . gabapentin (NEURONTIN) 300 MG capsule Take 1,200 mg by mouth 2 (two) times daily.       . insulin aspart (NOVOLOG) 100 UNIT/ML injection Inject 20 Units into the skin 2 (two) times daily. Sliding Scale      . insulin glargine (LANTUS) 100 UNIT/ML injection Inject 60 Units into the skin 2 (two) times daily. Before breakfast & Dinner      . lisinopril (PRINIVIL,ZESTRIL) 20 MG tablet Take 10 mg by mouth daily.       . metFORMIN (GLUCOPHAGE) 1000 MG tablet Take 1,000 mg by mouth 2 (two) times daily with a meal.        . Multiple Vitamin (MULTIVITAMIN) capsule Take 1 capsule by mouth daily.        . omeprazole (PRILOSEC) 40 MG capsule Take 40 mg by mouth daily.      . pramipexole (MIRAPEX) 0.5 MG tablet Take 0.5 mg by mouth at bedtime.       . propranolol (INDERAL) 40 MG tablet Take 40 mg by mouth 2 (two) times daily.      . simvastatin (ZOCOR) 20 MG tablet Take 20 mg by mouth every evening.      . traZODone (DESYREL) 50 MG tablet Take 50 mg by mouth at bedtime.        Allergies as of 03/03/2012 - Review Complete 03/03/2012  Allergen Reaction Noted  . Tylenol (acetaminophen) Other (See Comments) 07/17/2011    Family History  Problem Relation Age of Onset  . Cirrhosis Father     etoh  . Colon cancer Neg Hx   . Anesthesia problems Neg Hx   . Hypotension Neg Hx   . Malignant hyperthermia Neg Hx   . Pseudochol deficiency Neg Hx   . Kidney cancer Mother   . Cancer Mother   . HIV Brother   . Cirrhosis Brother     nash    History   Social History  . Marital Status: Married    Spouse Name: N/A    Number of Children: 3  . Years of Education: N/A   Occupational History  . disabled    Social History Main Topics  . Smoking status: Current Everyday Smoker -- 1.5 packs/day for 30 years    Types: Cigarettes  . Smokeless tobacco: Not on file  . Alcohol Use: No     drank heavily for few years in 20s  . Drug Use: No  . Sexually Active: Yes     Birth Control/ Protection: None  Review of Systems: Gen: Denies any fever, chills, sweats, anorexia, fatigue, weakness, malaise, weight loss. CV: Denies chest pain, angina, palpitations, syncope, orthopnea, PND, peripheral edema, and claudication. Resp: Denies dyspnea at rest, dyspnea with exercise, cough, sputum, wheezing, coughing up blood, and pleurisy. GI: Denies vomiting blood, jaundice, and fecal incontinence. GU : Denies urinary burning,   blood in urine, urinary frequency, urinary hesitancy, nocturnal urination, and urinary incontinence. MS: Denies joint pain, limitation of movement, and swelling, stiffness, low back pain, extremity pain. Denies muscle weakness, cramps, atrophy.  Derm: Denies rash, itching, dry skin, hives, moles, warts, or unhealing ulcers.  Psych: Denies depression, anxiety, memory loss, suicidal ideation, hallucinations, paranoia, and confusion. Heme: Denies bruising, bleeding, and enlarged lymph nodes. Neuro:  Denies any headaches, dizziness, paresthesias. Endo:  Denies any problems with DM, thyroid, adrenal function.  Physical Exam: BP 114/76  Pulse 78  Temp(Src) 98 F (36.7 C) (Temporal)  Ht 5' 7" (1.702 m)  Wt 233 lb 6.4 oz (105.87 kg)  BMI 36.56 kg/m2 General:   Alert,  Well-developed, well-nourished, pleasant and cooperative in NAD.  Accompanied by his wife. Head:  Normocephalic and atraumatic. Eyes:  Sclera clear, no icterus.   Conjunctiva pink. Ears:  Normal auditory acuity. Nose:  No deformity, discharge, or lesions. Mouth:  No deformity or lesions,oropharynx pink & moist. Neck:  Supple; no masses or thyromegaly. Lungs:  Clear throughout to auscultation.   No wheezes, crackles, or rhonchi. No acute distress. Heart:  Regular rate and rhythm; no murmurs, clicks, rubs,  or gallops. Abdomen:  + Protuberant. Left upper quadrant scar. Normal bowel sounds.  No bruits.  Soft, non-tender and non-distended.  + Large ventral hernia at umbilicus. Dry skin  outlining the area.  + Easily reducible today. No guarding or rebound tenderness.   Rectal:  Deferred. Msk:  Symmetrical without gross deformities. Normal posture. Pulses:  Normal pulses noted. Extremities:  No edema. Neurologic:  Alert and  oriented x4;  grossly normal neurologically. Skin:  Intact without significant lesions or rashes. Lymph Nodes:  No significant cervical adenopathy. Psych:  Alert and cooperative. Normal mood and affect.   

## 2012-03-03 NOTE — Assessment & Plan Note (Signed)
Resolved with supportive management. Dr. Leticia Penna manually reduced ventral hernia. He describes episodes of abdominal pain suggestive of recurrence of his hernia. He is able to manually reduce at home and describes relief. There was discussion about tertiary care referral for elective outpatient surgery with Dr. Jena Gauss while patient was hospitalized. Dr. Leticia Penna discussed the possibility of performing surgery at [facility]. I have asked Kyle [name] to discuss this further with Dr. Jena Gauss prior to his upcoming EGD.  Nonetheless, if he develops severe abdominal pain he is to go directly to the emergency department or call EMS.

## 2012-03-04 ENCOUNTER — Other Ambulatory Visit (HOSPITAL_COMMUNITY): Payer: Self-pay

## 2012-03-04 DIAGNOSIS — D509 Iron deficiency anemia, unspecified: Secondary | ICD-10-CM

## 2012-03-05 ENCOUNTER — Encounter (HOSPITAL_COMMUNITY): Payer: Medicaid Other | Attending: Oncology

## 2012-03-05 DIAGNOSIS — D509 Iron deficiency anemia, unspecified: Secondary | ICD-10-CM | POA: Insufficient documentation

## 2012-03-05 LAB — FERRITIN: Ferritin: 38 ng/mL (ref 22–322)

## 2012-03-05 LAB — CBC
Hemoglobin: 12.9 g/dL — ABNORMAL LOW (ref 13.0–17.0)
MCHC: 32.6 g/dL (ref 30.0–36.0)
Platelets: 93 10*3/uL — ABNORMAL LOW (ref 150–400)
RDW: 15.5 % (ref 11.5–15.5)

## 2012-03-05 NOTE — Progress Notes (Signed)
Lab draw

## 2012-03-08 ENCOUNTER — Telehealth (HOSPITAL_COMMUNITY): Payer: Self-pay

## 2012-03-08 NOTE — Progress Notes (Signed)
Addended by: Ellouise Newer III on: 03/08/2012 08:15 AM   Modules accepted: Orders

## 2012-03-08 NOTE — Telephone Encounter (Signed)
Message left for patient to call clinic to schedule feraheme infusion.

## 2012-03-12 ENCOUNTER — Encounter (HOSPITAL_BASED_OUTPATIENT_CLINIC_OR_DEPARTMENT_OTHER): Payer: Medicaid Other

## 2012-03-12 VITALS — BP 117/71 | HR 85 | Temp 97.1°F

## 2012-03-12 DIAGNOSIS — D509 Iron deficiency anemia, unspecified: Secondary | ICD-10-CM

## 2012-03-12 MED ORDER — SODIUM CHLORIDE 0.9 % IJ SOLN
INTRAMUSCULAR | Status: AC
  Filled 2012-03-12: qty 10

## 2012-03-12 MED ORDER — SODIUM CHLORIDE 0.9 % IJ SOLN
10.0000 mL | INTRAMUSCULAR | Status: DC | PRN
  Administered 2012-03-12: 10 mL
  Filled 2012-03-12: qty 10

## 2012-03-12 MED ORDER — SODIUM CHLORIDE 0.9 % IV SOLN
Freq: Once | INTRAVENOUS | Status: AC
  Administered 2012-03-12: 13:00:00 via INTRAVENOUS

## 2012-03-12 MED ORDER — SODIUM CHLORIDE 0.9 % IV SOLN
1020.0000 mg | Freq: Once | INTRAVENOUS | Status: AC
  Administered 2012-03-12: 1020 mg via INTRAVENOUS
  Filled 2012-03-12: qty 34

## 2012-03-15 ENCOUNTER — Telehealth: Payer: Self-pay | Admitting: Internal Medicine

## 2012-03-15 NOTE — Telephone Encounter (Signed)
I  would not advise pain meds with his cirrhosis. If the hernia is the issue, either Dr Caesar Bookman or tertiary care for surgical referral.

## 2012-03-15 NOTE — Telephone Encounter (Signed)
FYI patient has been seen by Dr. Dian Situ previously

## 2012-03-15 NOTE — Telephone Encounter (Signed)
Spoke with pt- he said the pain is around the hernia site that comes and goes. He said the pain sometimes lasts for an hour and sometimes its just 10 minutes. He is still able to push the hernia back in and his eating and bowel movements are normal. His egd is Wednesday 03/17/12, he is aware that he is to talk to RMR about hernia repair and he is also aware that he needs to go to ED if pain was severe. Please advise if pt can have pain meds.

## 2012-03-15 NOTE — Telephone Encounter (Signed)
Pt aware- he said he wasn't sure if it was the hernia or not. He will discuss it with RMR as well on Wednesday prior to his procedure. He said if RMR has any further recommendations that we can call him tomorrow.   Crystal, please send referral.

## 2012-03-15 NOTE — Telephone Encounter (Signed)
Tried to call pt- LMOM 

## 2012-03-15 NOTE — Telephone Encounter (Signed)
Patient states hes having a lot of pain that comes and goes with the umbilical hernia and hes been taking Aleve and its not helping and hes asking for something to help with his pain called to [facility] In [place] please advise

## 2012-03-16 ENCOUNTER — Encounter (HOSPITAL_COMMUNITY): Payer: Self-pay | Admitting: Pharmacy Technician

## 2012-03-16 MED ORDER — SODIUM CHLORIDE 0.45 % IV SOLN
Freq: Once | INTRAVENOUS | Status: AC
  Administered 2012-03-17: 1000 mL via INTRAVENOUS

## 2012-03-16 NOTE — Telephone Encounter (Signed)
Referral faxed to Dr Dian Situ

## 2012-03-16 NOTE — Telephone Encounter (Signed)
Corbin Ade, MD         Sent:  Mon March 15, 2012 4:40 PM   To:  Joselyn Arrow, NP   Cc:  Evalee Mutton, LPN      Message     Agree with restraint regarding narcotics; I recommend he see Dr. Leticia Penna regarding hernia; if no satisfaction there, would refer to tertiary center

## 2012-03-17 ENCOUNTER — Encounter: Payer: Self-pay | Admitting: Gastroenterology

## 2012-03-17 ENCOUNTER — Encounter (HOSPITAL_COMMUNITY)
Admission: RE | Disposition: A | Discharge status: Home / Self Care | Payer: Self-pay | Source: Ambulatory Visit | Attending: Internal Medicine

## 2012-03-17 ENCOUNTER — Telehealth: Payer: Self-pay | Admitting: Gastroenterology

## 2012-03-17 ENCOUNTER — Encounter (HOSPITAL_COMMUNITY): Payer: Self-pay | Admitting: *Deleted

## 2012-03-17 ENCOUNTER — Ambulatory Visit (HOSPITAL_COMMUNITY)
Admission: RE | Admit: 2012-03-17 | Discharge: 2012-03-17 | Disposition: A | Discharge status: Home / Self Care | Payer: Medicaid Other | Source: Ambulatory Visit | Attending: Internal Medicine | Admitting: Internal Medicine

## 2012-03-17 DIAGNOSIS — K449 Diaphragmatic hernia without obstruction or gangrene: Secondary | ICD-10-CM

## 2012-03-17 DIAGNOSIS — I8510 Secondary esophageal varices without bleeding: Secondary | ICD-10-CM | POA: Insufficient documentation

## 2012-03-17 DIAGNOSIS — Z794 Long term (current) use of insulin: Secondary | ICD-10-CM | POA: Insufficient documentation

## 2012-03-17 DIAGNOSIS — E119 Type 2 diabetes mellitus without complications: Secondary | ICD-10-CM | POA: Insufficient documentation

## 2012-03-17 DIAGNOSIS — K319 Disease of stomach and duodenum, unspecified: Secondary | ICD-10-CM

## 2012-03-17 DIAGNOSIS — Z79899 Other long term (current) drug therapy: Secondary | ICD-10-CM | POA: Insufficient documentation

## 2012-03-17 DIAGNOSIS — G4733 Obstructive sleep apnea (adult) (pediatric): Secondary | ICD-10-CM | POA: Insufficient documentation

## 2012-03-17 DIAGNOSIS — I10 Essential (primary) hypertension: Secondary | ICD-10-CM | POA: Insufficient documentation

## 2012-03-17 DIAGNOSIS — Z01812 Encounter for preprocedural laboratory examination: Secondary | ICD-10-CM | POA: Insufficient documentation

## 2012-03-17 DIAGNOSIS — K7460 Unspecified cirrhosis of liver: Secondary | ICD-10-CM | POA: Insufficient documentation

## 2012-03-17 DIAGNOSIS — I8500 Esophageal varices without bleeding: Secondary | ICD-10-CM

## 2012-03-17 HISTORY — PX: ESOPHAGOGASTRODUODENOSCOPY: SHX5428

## 2012-03-17 SURGERY — EGD (ESOPHAGOGASTRODUODENOSCOPY)
Anesthesia: Moderate Sedation

## 2012-03-17 MED ORDER — MEPERIDINE HCL 100 MG/ML IJ SOLN
INTRAMUSCULAR | Status: DC | PRN
  Administered 2012-03-17 (×2): 50 mg via INTRAVENOUS

## 2012-03-17 MED ORDER — MIDAZOLAM HCL 5 MG/5ML IJ SOLN
INTRAMUSCULAR | Status: AC
  Filled 2012-03-17: qty 10

## 2012-03-17 MED ORDER — STERILE WATER FOR IRRIGATION IR SOLN
Status: DC | PRN
  Administered 2012-03-17: 09:00:00

## 2012-03-17 MED ORDER — MEPERIDINE HCL 100 MG/ML IJ SOLN
INTRAMUSCULAR | Status: AC
  Filled 2012-03-17: qty 1

## 2012-03-17 MED ORDER — MIDAZOLAM HCL 5 MG/5ML IJ SOLN
INTRAMUSCULAR | Status: DC | PRN
  Administered 2012-03-17 (×2): 2 mg via INTRAVENOUS

## 2012-03-17 NOTE — Interval H&P Note (Signed)
History and Physical Interval Note:  03/17/2012 8:31 AM  [name]  has presented today for surgery, with the diagnosis of VARICES  The various methods of treatment have been discussed with the patient and family. After consideration of risks, benefits and other options for treatment, the patient has consented to  Procedure(s) (LRB): ESOPHAGOGASTRODUODENOSCOPY (EGD) (N/A) as a surgical intervention .  The patients' history has been reviewed, patient examined, no change in status, stable for surgery.  I have reviewed the patients' chart and labs.  Questions were answered to the patient's satisfaction.     [name]

## 2012-03-17 NOTE — Discharge Instructions (Signed)
EGD Discharge instructions Please read the instructions outlined below and refer to this sheet in the next few weeks. These discharge instructions provide you with general information on caring for yourself after you leave the hospital. Your doctor may also give you specific instructions. While your treatment has been planned according to the most current medical practices available, unavoidable complications occasionally occur. If you have any problems or questions after discharge, please call your doctor. ACTIVITY  You may resume your regular activity but move at a slower pace for the next 24 hours.   Take frequent rest periods for the next 24 hours.   Walking will help expel (get rid of) the air and reduce the bloated feeling in your abdomen.   No driving for 24 hours (because of the anesthesia (medicine) used during the test).   You may shower.   Do not sign any important legal documents or operate any machinery for 24 hours (because of the anesthesia used during the test).  NUTRITION  Drink plenty of fluids.   You may resume your normal diet.   Begin with a light meal and progress to your normal diet.   Avoid alcoholic beverages for 24 hours or as instructed by your caregiver.  MEDICATIONS  You may resume your normal medications unless your caregiver tells you otherwise.  WHAT YOU CAN EXPECT TODAY  You may experience abdominal discomfort such as a feeling of fullness or "gas" pains.  FOLLOW-UP  Your doctor will discuss the results of your test with you.  SEEK IMMEDIATE MEDICAL ATTENTION IF ANY OF THE FOLLOWING OCCUR:  Excessive nausea (feeling sick to your stomach) and/or vomiting.   Severe abdominal pain and distention (swelling).   Trouble swallowing.   Temperature over 101 F (37.8 C).   Rectal bleeding or vomiting of blood.    Soft diet today; advance as tolerated tomorrow.  Continue propranolol   See surgeon regarding getting umbilical hernia repaired   See Dr Leticia Penna Mar 25, 2012 at 2:00pm at office  Repeat EGD in 3 months  Esophageal Varices Esophageal varices are blood vessels in the esophagus (the tube that carries food to the stomach). Under normal circumstances, these blood vessels carry very small amounts of blood. If the liver is damaged, and the main vein (portal vein) that carries blood is blocked, larger amounts of blood might back up into these esophageal varices. The esophageal varices are too fragile for this extra blood flow and pressure. They may swell and then break, causing life-threatening bleeding (hemorrhage). CAUSES  Any kind of liver disease can cause esophageal varices. Cirrhosis of the liver, usually due to alcoholism, is the most common reason. Other reasons include:  Severe heart failure: When the heart cannot pump blood around the body effectively enough, pressure may rise in the portal vein.   A blood clot in the portal vein.   Sarcoidosis. This is an inflammatory disease that can affect the liver.   Schistosomiasis. A parasitic infection that can cause liver damage.  SYMPTOMS  Symptoms may include:  Vomiting bright red or black coffee ground like material.   Black, tarry stools.   Low blood pressure.   Dizziness.   Loss of consciousness.  DIAGNOSIS  When someone has known cirrhosis, their caregiver may screen them for the presence of esophageal varices. Tests that are used include:  Endoscopy (esophagogastroduodenoscopy or EGD). A thin, lighted tube is inserted through the mouth and into the esophagus. The caregiver will rate the varices according to their size and  the presence of red streaks. These characteristics help determine the risk of bleeding.   Imaging tests. CT scans and MRI scans can both show esophageal varices. However, they cannot predict likelihood of bleeding.  TREATMENT  There are different types of treatment used for esophageal varices. These include:  Variceal ligation. During EGD,  the caregiver places a rubber band around the vein to prevent bleeding.   Injection therapy. During EGD, the caregiver can inject the veins with a solution that shrinks them and scars them closed.   Medications can decrease the pressure in the esophageal varices and prevent bleeding.   Balloon tamponade. A tube is put into the esophagus and a balloon is passed down it. When the balloon is inflated, it puts pressures on the veins and stops the bleeding.   Shunt. A small tube is placed within the liver veins. This decreases the blood flow and pressure to the varices, decreasing bleeding risk.   Liver transplant may be done as a last resort.  HOME CARE INSTRUCTIONS   Take all medications exactly as directed.   Follow any prescribed diet. Avoid alcohol if recommended.   Follow instructions regarding both rest and physical activity.   Seek help to treat a drinking problem.  SEEK IMMEDIATE MEDICAL CARE IF:  You vomit blood or coffee-ground material.   You pass black tarry stools or bright red blood in the stools.   You are dizzy, lightheaded or faint.   You are unable to eat or drink.   You experience chest pain.  Document Released: 01/31/2004 Document Revised: 10/30/2011 Document Reviewed: 01/11/2009 [facility] Patient Information 2012 [place], [place].

## 2012-03-17 NOTE — Op Note (Signed)
[facility] [address] [place], [place]  [address]  ENDOSCOPY PROCEDURE REPORT  PATIENT:  [name], Kyle  MR#:  [id number] BIRTHDATE:  08-30-1956, 55 yrs. old  GENDER:  male  ENDOSCOPIST:  R. Roetta Sessions, MD Caleen Essex Referred by:  Leodis Sias, M.D.  PROCEDURE DATE:  03/17/2012 PROCEDURE:  EGD with esophageal band ligation  INDICATIONS:   Followup known esophageal varices  INFORMED CONSENT:   The risks, benefits, limitations, alternatives and imponderables have been discussed.  The potential for biopsy, esophogeal dilation, etc. have also been reviewed.  Questions have been answered.  All parties agreeable.  Please see the history and physical in the medical record for more information.  MEDICATIONS:        Versed 4 mg IV and Demerol 100 mg IV in divided doses. Cetacaine spray.  DESCRIPTION OF PROCEDURE:   The EG-2990i ([id number]) and EG-2990i ([id number]) endoscope was introduced through the mouth and advanced to the second portion of the duodenum without difficulty or limitations.  The mucosal surfaces were surveyed very carefully during advancement of the scope and upon withdrawal.  Retroflexion view of the proximal stomach and esophagogastric junction was performed.  <<PROCEDUREIMAGES>>  FINDINGS:   Multiple columns of grade 2 esophageal varices without bleeding stigmata. Stomach empty. Small hiatal hernia. Portal gastropathy. Patent pylorus. Normal first and second portion of the duodenum  THERAPEUTIC / DIAGNOSTIC MANEUVERS PERFORMED:  Utilizing the Microvasive 7 shot bander, a total of 10 bands were applied with good hemostasis maintained and without apparent complication.  COMPLICATIONS:   None  IMPRESSION:   Esophageal varices status post band ligation. Hiatal hernia. Portal gastropathy.  RECOMMENDATIONS:  Repeat EGD in 3 months. Continue propranolol. Revisit the surgeon regarding getting your umbilical  hernia repair.  ______________________________ R. Roetta Sessions, MD Caleen Essex  CC:  n. eSIGNED:   R. Roetta Sessions at 03/17/2012 09:17 AM  [place] Born, [phone]

## 2012-03-17 NOTE — Telephone Encounter (Signed)
Pt is scheduled to see Dr Caesar Bookman on 04/30 @ 9:30- mailed letter to pt

## 2012-03-17 NOTE — H&P (View-Only) (Signed)
Primary Care Physician:  Redmond Baseman, MD, MD Primary Gastroenterologist:  Dr. Jena Gauss General Surgeon: Dr. Leticia Penna   Chief Complaint  Patient presents with  . Follow-up    2 week hospital follow up   HPI:  Kyle [name] is a 56 y.o. male here for followup hospitalization from 02/15/2012 to 02/18/2012 at [facility] for high-grade small bowel obstruction secondary to ventral anterior abdominal wall hernia just superior to umbilicus manually reduced by Dr. Leticia Penna, hyperkalemia, acute renal failure, and [name] cirrhosis.   [name] rapidly improved with initiation of aggressive fluids, NG tube, and manual reduction of his hernia by Dr. Leticia Penna. Due to increased risk of perioperative morbidity and or mortality, Dr. Jena Gauss have recommended for sure referral for consideration of herniorrhaphy. [name] has continued to have daily pain at hernia site.  The pain is intermittent and worse with eating. He complains of feeling gassy and bloated.  At times he can't sleep.  The pain generally lasts for a few hours and will resolve.  He can usually relieve the pain by manually reducing the hernia himself and he feels much better.   Denies heartburn, indigestion, nausea, vomiting, dysphagia, odynophagia or anorexia. Denies constipation, diarrhea, rectal bleeding, melena or weight loss.  Denies fever or chills. Past Medical History  Diagnosis Date  . DM (diabetes mellitus)   . Cirrhosis     bx proven steatohepatitis with cirrhosis (2010); per note from Feb 2011, received Hep A and B vaccines in 2010  . HTN (hypertension)   . RLS (restless legs syndrome)   . Sleep apnea   . Hyperlipidemia   . IDA (iron deficiency anemia)   . GERD (gastroesophageal reflux disease)   . Depression   . Peripheral neuropathy   . Urothelial cancer     2010, paillary low-grade, h/o recurrence 2011  . B12 deficiency   . Psoriasis   . 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 Anemia due to multiple mechanisms  05/13/2011  . History of alcohol abuse 05/13/2011  . ANEMIA-IRON DEFICIENCY 03/09/2009  . S/P endoscopy May 2012    4 columns grade II esophageal varices; due for repeat in Nov 2013   . S/P colonoscopy May 2012    Tubular adenoma  . Low back pain   . Cancer     bladder ca 05/2009 removal and  w/chemo wash  . Esophageal varices with bleeding 11/24/11    s/p emergent EGD 11/25/11 by Dr. Rhea Belton at [facility], Grade III esophageal varices s/p banding X 5  . Small bowel obstruction 02/15/2012    Admitted to [facility], managed by Dr. Leticia Penna, ventral hernia manually reduced  . Ventral hernia 02/15/12    Past Surgical History  Procedure Date  . Carpel tunnel   . Shoulder surgery   . Adrenal mass surgery 05/2009    benign, left  . Bladder surgery 01/2009 and 06/2010    cancer 2010, small recurrence in 06/2010  . Colonoscopy 04/2009    moderate int hemorrhoids, rare sigmoid diverticula, one mm sessile hyperplastic rectal polyp  . Esophagogastroduodenoscopy 03/2009    small hh  . Small bowel capsule endoscopy 03/2009    couple of small benign appearing erosions, nonbleeding  . Egd/tcs 08/2007    small hiatal hernia, pancolonic diverticula, friable anal canal, 3cm salmon colored epithelium in distal esophagus, bx negative for Barrett's  . Skin cancer excision Oct 2012    left arm  . Esophagogastroduodenoscopy 11/25/2011    Procedure: ESOPHAGOGASTRODUODENOSCOPY (EGD);  Surgeon: Erick Blinks,  MD;  Location: [facility] ENDOSCOPY;  Service: Gastroenterology;  Laterality: Left;  . Esophagogastroduodenoscopy 01/14/2012    Dr Lillia Carmel columns Gr2 varices, 6 bands placed, HH, distal esophageal ulcer, portal gastropathy, antral erosions    Current Outpatient Prescriptions  Medication Sig Dispense Refill  . dicyclomine (BENTYL) 10 MG capsule Take 1 capsule (10 mg total) by mouth 4 (four) times daily.  120 capsule  0  . fish oil-omega-3 fatty acids 1000 MG capsule Take 1 g by mouth 3 (three) times daily.       . furosemide (LASIX)  20 MG tablet Take 20 mg by mouth daily.       [name] gabapentin (NEURONTIN) 300 MG capsule Take 1,200 mg by mouth 2 (two) times daily.       . insulin aspart (NOVOLOG) 100 UNIT/ML injection Inject 20 Units into the skin 2 (two) times daily. Sliding Scale      . insulin glargine (LANTUS) 100 UNIT/ML injection Inject 60 Units into the skin 2 (two) times daily. Before breakfast & Dinner      . lisinopril (PRINIVIL,ZESTRIL) 20 MG tablet Take 10 mg by mouth daily.       . metFORMIN (GLUCOPHAGE) 1000 MG tablet Take 1,000 mg by mouth 2 (two) times daily with a meal.        . Multiple Vitamin (MULTIVITAMIN) capsule Take 1 capsule by mouth daily.        [name] omeprazole (PRILOSEC) 40 MG capsule Take 40 mg by mouth daily.      . pramipexole (MIRAPEX) 0.5 MG tablet Take 0.5 mg by mouth at bedtime.       . propranolol (INDERAL) 40 MG tablet Take 40 mg by mouth 2 (two) times daily.      . simvastatin (ZOCOR) 20 MG tablet Take 20 mg by mouth every evening.      . traZODone (DESYREL) 50 MG tablet Take 50 mg by mouth at bedtime.        Allergies as of 03/03/2012 - Review Complete 03/03/2012  Allergen Reaction Noted  . Tylenol (acetaminophen) Other (See Comments) 07/17/2011    Family History  Problem Relation Age of Onset  . Cirrhosis Father     etoh  . Colon cancer Neg Hx   . Anesthesia problems Neg Hx   . Hypotension Neg Hx   . Malignant hyperthermia Neg Hx   . Pseudochol deficiency Neg Hx   . Kidney cancer Mother   . Cancer Mother   . HIV Brother   . Cirrhosis Brother     nash    History   Social History  . Marital Status: Married    Spouse Name: N/A    Number of Children: 3  . Years of Education: N/A   Occupational History  . disabled    Social History Main Topics  . Smoking status: Current Everyday Smoker -- 1.5 packs/day for 30 years    Types: Cigarettes  . Smokeless tobacco: Not on file  . Alcohol Use: No     drank heavily for few years in 20s  . Drug Use: No  . Sexually Active: Yes     Birth Control/ Protection: None  Review of Systems: Gen: Denies any fever, chills, sweats, anorexia, fatigue, weakness, malaise, weight loss. CV: Denies chest pain, angina, palpitations, syncope, orthopnea, PND, peripheral edema, and claudication. Resp: Denies dyspnea at rest, dyspnea with exercise, cough, sputum, wheezing, coughing up blood, and pleurisy. GI: Denies vomiting blood, jaundice, and fecal incontinence. GU : Denies urinary burning,  blood in urine, urinary frequency, urinary hesitancy, nocturnal urination, and urinary incontinence. MS: Denies joint pain, limitation of movement, and swelling, stiffness, low back pain, extremity pain. Denies muscle weakness, cramps, atrophy.  Derm: Denies rash, itching, dry skin, hives, moles, warts, or unhealing ulcers.  Psych: Denies depression, anxiety, memory loss, suicidal ideation, hallucinations, paranoia, and confusion. Heme: Denies bruising, bleeding, and enlarged lymph nodes. Neuro:  Denies any headaches, dizziness, paresthesias. Endo:  Denies any problems with DM, thyroid, adrenal function.  Physical Exam: BP 114/76  Pulse 78  Temp(Src) 98 F (36.7 C) (Temporal)  Ht 5\' 7"  (1.702 m)  Wt 233 lb 6.4 oz (105.87 kg)  BMI 36.56 kg/m2 General:   Alert,  Well-developed, well-nourished, pleasant and cooperative in NAD.  Accompanied by his wife. Head:  Normocephalic and atraumatic. Eyes:  Sclera clear, no icterus.   Conjunctiva pink. Ears:  Normal auditory acuity. Nose:  No deformity, discharge, or lesions. Mouth:  No deformity or lesions,oropharynx pink & moist. Neck:  Supple; no masses or thyromegaly. Lungs:  Clear throughout to auscultation.   No wheezes, crackles, or rhonchi. No acute distress. Heart:  Regular rate and rhythm; no murmurs, clicks, rubs,  or gallops. Abdomen:  + Protuberant. Left upper quadrant scar. Normal bowel sounds.  No bruits.  Soft, non-tender and non-distended.  + Large ventral hernia at umbilicus. Dry skin  outlining the area.  + Easily reducible today. No guarding or rebound tenderness.   Rectal:  Deferred. Msk:  Symmetrical without gross deformities. Normal posture. Pulses:  Normal pulses noted. Extremities:  No edema. Neurologic:  Alert and  oriented x4;  grossly normal neurologically. Skin:  Intact without significant lesions or rashes. Lymph Nodes:  No significant cervical adenopathy. Psych:  Alert and cooperative. Normal mood and affect.

## 2012-03-19 ENCOUNTER — Encounter (HOSPITAL_COMMUNITY): Payer: Self-pay | Admitting: Internal Medicine

## 2012-03-26 ENCOUNTER — Encounter (HOSPITAL_COMMUNITY): Payer: Self-pay | Admitting: Pharmacy Technician

## 2012-03-26 ENCOUNTER — Encounter (HOSPITAL_COMMUNITY): Payer: Self-pay

## 2012-03-26 ENCOUNTER — Encounter (HOSPITAL_COMMUNITY)
Admission: RE | Admit: 2012-03-26 | Discharge: 2012-03-26 | Disposition: A | Discharge status: Home / Self Care | Payer: Medicaid Other | Source: Ambulatory Visit | Attending: General Surgery | Admitting: General Surgery

## 2012-03-26 LAB — CBC
HCT: 40.3 % (ref 39.0–52.0)
Hemoglobin: 13.4 g/dL (ref 13.0–17.0)
MCH: 30.5 pg (ref 26.0–34.0)
MCHC: 33.3 g/dL (ref 30.0–36.0)
RBC: 4.39 MIL/uL (ref 4.22–5.81)

## 2012-03-26 LAB — BASIC METABOLIC PANEL
BUN: 14 mg/dL (ref 6–23)
CO2: 25 mEq/L (ref 19–32)
Chloride: 99 mEq/L (ref 96–112)
Glucose, Bld: 172 mg/dL — ABNORMAL HIGH (ref 70–99)
Potassium: 4.4 mEq/L (ref 3.5–5.1)

## 2012-03-26 NOTE — Patient Instructions (Addendum)
20 MARKUS CASTEN  03/26/2012   Your procedure is scheduled on:  04/05/2012  Report to [facility] at  615  AM.  Call this number if you have problems the morning of surgery: [phone]   Remember:   Do not eat food:After Midnight.  May have clear liquids:until Midnight .  Clear liquids include soda, tea, black coffee, apple or grape juice, broth.  Take these medicines the morning of surgery with A SIP OF WATER: family   Do not wear jewelry, make-up or nail polish.  Do not wear lotions, powders, or perfumes. You may wear deodorant.  Do not shave 48 hours prior to surgery.  Do not bring valuables to the hospital.  Contacts, dentures or bridgework may not be worn into surgery.  Leave suitcase in the car. After surgery it may be brought to your room.  For patients admitted to the hospital, checkout time is 11:00 AM the day of discharge.   Patients discharged the day of surgery will not be allowed to drive home.  Name and phone number of your driver: family  Special Instructions: CHG Shower Use Special Wash: 1/2 bottle night before surgery and 1/2 bottle morning of surgery.   Please read over the following fact sheets that you were given: Pain Booklet, MRSA Information, Surgical Site Infection Prevention, Anesthesia Post-op Instructions and Care and Recovery After Surgery Hernia, Surgical Repair A hernia occurs when an internal organ pushes out through a weak spot in the belly (abdominal) wall muscles. Hernias commonly occur in the groin and around the navel. Hernias often can be pushed back into place (reduced). Most hernias tend to get worse over time. Problems occur when abdominal contents get stuck in the opening (incarcerated hernia). The blood supply gets cut off (strangulated hernia). This is an emergency and needs surgery. Otherwise, hernia repair can be an elective procedure. This means you can schedule this at your convenience when an emergency is not present. Because complications can  occur, if you decide to repair the hernia, it is best to do it soon. When it becomes an emergency procedure, there is increased risk of complications after surgery. CAUSES   Heavy lifting.   Obesity.   Prolonged coughing.   Straining to move your bowels.   Hernias can also occur through a cut (incision) by a surgeonafter an abdominal operation.  HOME CARE INSTRUCTIONS Before the repair:  Bed rest is not required. You may continue your normal activities, but avoid heavy lifting (more than 10 pounds) or straining. Cough gently. If you are a smoker, it is best to stop. Even the best hernia repair can break down with the continual strain of coughing.   Do not wear anything tight over your hernia. Do not try to keep it in with an outside bandage or truss. These can damage abdominal contents if they are trapped in the hernia sac.   Eat a normal diet. Avoid constipation. Straining over long periods of time to have a bowel movement will increase hernia size. It also can breakdown repairs. If you cannot do this with diet alone, laxatives or stool softeners may be used.  PRIOR TO SURGERY, SEEK IMMEDIATE MEDICAL CARE IF: You have problems (symptoms) of a trapped (incarcerated) hernia. Symptoms include:  An oral temperature above 102 F (38.9 C) develops, or as your caregiver suggests.   Increasing abdominal pain.   Feeling sick to your stomach(nausea) and vomiting.   You stop passing gas or stool.   The hernia is stuck  outside the abdomen, looks discolored, feels hard, or is tender.   You have any changes in your bowel habits or in the hernia that is unusual for you.  LET YOUR CAREGIVERS KNOW ABOUT THE FOLLOWING:  Allergies.   Medications taken including herbs, eye drops, over the counter medications, and creams.   Use of steroids (by mouth or creams).   Family or personal history of problems with anesthetics or Novocaine.   Possibility of pregnancy, if this applies.   Personal  history of blood clots (thrombophlebitis).   Family or personal history of bleeding or blood problems.   Previous surgery.   Other health problems.  BEFORE THE PROCEDURE You should be present 1 hour prior to your procedure, or as directed by your caregiver.  AFTER THE PROCEDURE After surgery, you will be taken to the recovery area. A nurse will watch and check your progress there. Once you are awake, stable, and taking fluids well, you will be allowed to go home as long as there are no problems. Once home, an ice pack (wrapped in a light towel) applied to your operative site may help with discomfort. It may also keep the swelling down. Do not lift anything heavier than 10 pounds (4.55 kilograms). Take showers not baths. Do not drive while taking narcotics. Follow instructions as suggested by your caregiver.  SEEK IMMEDIATE MEDICAL CARE IF: After surgery:  There is redness, swelling, or increasing pain in the wound.   There is pus coming from the wound.   There is drainage from a wound lasting longer than 1 day.   An unexplained oral temperature above 102 F (38.9 C) develops.   You notice a foul smell coming from the wound or dressing.   There is a breaking open of a wound (edged not staying together) after the sutures have been removed.   You notice increasing pain in the shoulders (shoulder strap areas).   You develop dizzy episodes or fainting while standing.   You develop persistent nausea or vomiting.   You develop a rash.   You have difficulty breathing.   You develop any reaction or side effects to medications given.  MAKE SURE YOU:   Understand these instructions.   Will watch your condition.   Will get help right away if you are not doing well or get worse.  Document Released: 05/06/2001 Document Revised: 10/30/2011 Document Reviewed: 03/28/2008 [facility] Patient Information 2012 [place], [place].PATIENT INSTRUCTIONS POST-ANESTHESIA  IMMEDIATELY FOLLOWING  SURGERY:  Do not drive or operate machinery for the first twenty four hours after surgery.  Do not make any important decisions for twenty four hours after surgery or while taking narcotic pain medications or sedatives.  If you develop intractable nausea and vomiting or a severe headache please notify your doctor immediately.  FOLLOW-UP:  Please make an appointment with your surgeon as instructed. You do not need to follow up with anesthesia unless specifically instructed to do so.  WOUND CARE INSTRUCTIONS (if applicable):  Keep a dry clean dressing on the anesthesia/puncture wound site if there is drainage.  Once the wound has quit draining you may leave it open to air.  Generally you should leave the bandage intact for twenty four hours unless there is drainage.  If the epidural site drains for more than 36-48 hours please call the anesthesia department.  QUESTIONS?:  Please feel free to call your physician or the hospital operator if you have any questions, and they will be happy to assist you.  [facility] Anesthesia Department [address] [place] [phone]

## 2012-03-30 NOTE — Progress Notes (Signed)
Dr. Leticia Penna notified of Platelet count of 76,000. Pt to be bumped to second OR case but to come in at [facility] for platelets to be transfused the morning of surgery.

## 2012-03-31 ENCOUNTER — Encounter (HOSPITAL_COMMUNITY): Payer: Medicaid Other

## 2012-03-31 ENCOUNTER — Emergency Department (HOSPITAL_COMMUNITY)
Admission: EM | Admit: 2012-03-31 | Discharge: 2012-03-31 | Disposition: A | Discharge status: Home / Self Care | Payer: Medicaid Other | Attending: Emergency Medicine | Admitting: Emergency Medicine

## 2012-03-31 ENCOUNTER — Encounter (HOSPITAL_COMMUNITY): Payer: Self-pay | Admitting: *Deleted

## 2012-03-31 DIAGNOSIS — L0291 Cutaneous abscess, unspecified: Secondary | ICD-10-CM

## 2012-03-31 DIAGNOSIS — L03119 Cellulitis of unspecified part of limb: Secondary | ICD-10-CM | POA: Insufficient documentation

## 2012-03-31 DIAGNOSIS — Z794 Long term (current) use of insulin: Secondary | ICD-10-CM | POA: Insufficient documentation

## 2012-03-31 DIAGNOSIS — I10 Essential (primary) hypertension: Secondary | ICD-10-CM | POA: Insufficient documentation

## 2012-03-31 DIAGNOSIS — L02419 Cutaneous abscess of limb, unspecified: Secondary | ICD-10-CM | POA: Insufficient documentation

## 2012-03-31 DIAGNOSIS — E119 Type 2 diabetes mellitus without complications: Secondary | ICD-10-CM | POA: Insufficient documentation

## 2012-03-31 HISTORY — DX: Methicillin resistant Staphylococcus aureus infection, unspecified site: A49.02

## 2012-03-31 MED ORDER — HYDROCODONE-ACETAMINOPHEN 5-325 MG PO TABS: ORAL_TABLET | ORAL | Status: DC

## 2012-03-31 MED ORDER — LIDOCAINE HCL (PF) 2 % IJ SOLN
10.0000 mL | Freq: Once | INTRAMUSCULAR | Status: DC
  Filled 2012-03-31: qty 10

## 2012-03-31 MED ORDER — DOXYCYCLINE HYCLATE 100 MG PO CAPS: 100.0000 mg | ORAL_CAPSULE | Freq: Two times a day (BID) | ORAL | Status: DC

## 2012-03-31 NOTE — ED Provider Notes (Signed)
History     CSN: [id number]  Arrival date & time 03/31/12  1201   First MD Initiated Contact with Patient 03/31/12 1225      Chief Complaint  Patient presents with  . Abscess    (Consider location/radiation/quality/duration/timing/severity/associated sxs/prior treatment) Patient is a 56 y.o. male presenting with abscess. The history is provided by the patient.  Abscess  This is a new problem. The current episode started less than one week ago. The onset was gradual. The problem occurs occasionally. The problem has been gradually worsening. The abscess is present on the right lower leg. The problem is mild. The abscess is characterized by painfulness, draining and swelling. The abscess first occurred at home. Pertinent negatives include no decrease in physical activity, no fever, no diarrhea and no vomiting. His past medical history is significant for skin abscesses in family. There were no sick contacts. Recently, medical care has been given by a specialist.    Past Medical History  Diagnosis Date  . DM (diabetes mellitus)   . Cirrhosis     bx proven steatohepatitis with cirrhosis (2010); per note from Feb 2011, received Hep A and B vaccines in 2010  . HTN (hypertension)   . RLS (restless legs syndrome)   . Sleep apnea   . Hyperlipidemia   . IDA (iron deficiency anemia)   . GERD (gastroesophageal reflux disease)   . Depression   . Peripheral neuropathy   . Urothelial cancer     2010, paillary low-grade, h/o recurrence 2011  . B12 deficiency   . Psoriasis   . 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 Anemia due to multiple mechanisms 05/13/2011  . History of alcohol abuse 05/13/2011  . ANEMIA-IRON DEFICIENCY 03/09/2009  . S/P endoscopy May 2012    4 columns grade II esophageal varices; due for repeat in Nov 2013   . S/P colonoscopy May 2012    Tubular adenoma  . Low back pain   . Cancer     bladder ca 05/2009 removal and  w/chemo wash  . Esophageal varices with  bleeding 11/24/11    s/p emergent EGD 11/25/11 by Dr. Rhea Belton at [facility], Grade III esophageal varices s/p banding X 5  . Small bowel obstruction 02/15/2012    Admitted to [facility], managed by Dr. Leticia Penna, ventral hernia manually reduced  . Ventral hernia 02/15/12  . MRSA (methicillin resistant Staphylococcus aureus)     Past Surgical History  Procedure Date  . Carpel tunnel   . Shoulder surgery   . Adrenal mass surgery 05/2009    benign, left  . Bladder surgery 01/2009 and 06/2010    cancer 2010, small recurrence in 06/2010  . Colonoscopy 04/2009    moderate int hemorrhoids, rare sigmoid diverticula, one mm sessile hyperplastic rectal polyp  . Esophagogastroduodenoscopy 03/2009    small hh  . Small bowel capsule endoscopy 03/2009    couple of small benign appearing erosions, nonbleeding  . Egd/tcs 08/2007    small hiatal hernia, pancolonic diverticula, friable anal canal, 3cm salmon colored epithelium in distal esophagus, bx negative for Barrett's  . Skin cancer excision Oct 2012    left arm  . Esophagogastroduodenoscopy 11/25/2011    Procedure: ESOPHAGOGASTRODUODENOSCOPY (EGD);  Surgeon: Erick Blinks, MD;  Location: [facility] ENDOSCOPY;  Service: Gastroenterology;  Laterality: Left;  . Esophagogastroduodenoscopy 01/14/2012    Dr Rourk->4-5 columns Gr2 varices, 6 bands placed, HH, distal esophageal ulcer, portal gastropathy, antral erosions  . Esophagogastroduodenoscopy 03/17/2012    Procedure: ESOPHAGOGASTRODUODENOSCOPY (EGD);  Surgeon:  Corbin Ade, MD;  Location: [facility]O [facility];  Service: Endoscopy;  Laterality: N/A;  8:30    Family History  Problem Relation Age of Onset  . Cirrhosis Father     etoh  . Colon cancer Neg Hx   . Anesthesia problems Neg Hx   . Hypotension Neg Hx   . Malignant hyperthermia Neg Hx   . Pseudochol deficiency Neg Hx   . Kidney cancer Mother   . Cancer Mother   . HIV Brother   . Cirrhosis Brother     nash    History  Substance Use Topics  . Smoking status: Current  Everyday Smoker -- 1.5 packs/day for 30 years    Types: Cigarettes  . Smokeless tobacco: Not on file  . Alcohol Use: No     drank heavily for few years in 20s      Review of Systems  Constitutional: Negative for fever and appetite change.  Gastrointestinal: Negative for vomiting and diarrhea.  Musculoskeletal: Negative for arthralgias.  Skin: Positive for wound.       abscess  Neurological: Negative for weakness.  All other systems reviewed and are negative.    Allergies  Tylenol  Home Medications   Current Outpatient Rx  Name Route Sig Dispense Refill  . OMEGA-3 FATTY ACIDS 1000 MG PO CAPS Oral Take 1 g by mouth 3 (three) times daily.     . FUROSEMIDE 20 MG PO TABS Oral Take 20 mg by mouth daily.     [name] GABAPENTIN 300 MG PO CAPS Oral Take 600-900 mg by mouth 2 (two) times daily. Patient takes 600mg  at about 1700 hours and then takes 900mg  at bedtime    . INSULIN ASPART 100 UNIT/ML [place] SOLN Subcutaneous Inject 25-35 Units into the skin 2 (two) times daily. Sliding Scale Checks blood sugar 3 to 4 times daily    . INSULIN GLARGINE 100 UNIT/ML [place] SOLN Subcutaneous Inject 60 Units into the skin 2 (two) times daily. Before breakfast & Dinner    . LISINOPRIL 20 MG PO TABS Oral Take 10 mg by mouth daily.     [name] MAGNESIUM OXIDE 400 MG PO TABS Oral Take 400 mg by mouth daily.    [name] METFORMIN HCL 1000 MG PO TABS Oral Take 1,000 mg by mouth 2 (two) times daily with a meal.      . MULTIVITAMINS PO CAPS Oral Take 1 capsule by mouth daily.      [name] MUPIROCIN 2 % EX OINT Topical Apply 1 application topically 2 (two) times daily.    [name] NAPROXEN SODIUM 220 MG PO TABS Oral Take 220 mg by mouth 2 (two) times daily as needed. Pain    . OMEPRAZOLE 40 MG PO CPDR Oral Take 40 mg by mouth daily.    [name] PRAMIPEXOLE DIHYDROCHLORIDE 0.5 MG PO TABS Oral Take 0.5 mg by mouth at bedtime.     [name] PROPRANOLOL HCL 40 MG PO TABS Oral Take 40 mg by mouth 2 (two) times daily.    [name] SIMVASTATIN 20 MG PO TABS Oral Take 20  mg by mouth every evening.    . TRAZODONE HCL 50 MG PO TABS Oral Take 50 mg by mouth at bedtime.    [name] DOXYCYCLINE HYCLATE 100 MG PO CAPS Oral Take 1 capsule (100 mg total) by mouth 2 (two) times daily. For 10 days 20 capsule 0  . HYDROCODONE-ACETAMINOPHEN 5-325 MG PO TABS  Take one-two tabs po q 4-6 hrs prn pain 20 tablet 0  BP 134/87  Pulse 82  Temp(Src) 97.8 F (36.6 C) (Oral)  Resp 20  Ht 5\' 7"  (1.702 m)  Wt 231 lb (104.781 kg)  BMI 36.18 kg/m2  SpO2 97%  Physical Exam  Nursing note and vitals reviewed. Constitutional: He is oriented to person, place, and time. He appears well-developed and well-nourished. No distress.  HENT:  Head: Normocephalic and atraumatic.  Cardiovascular: Normal rate, regular rhythm and normal heart sounds.   Pulmonary/Chest: Effort normal and breath sounds normal.  Musculoskeletal: Normal range of motion. He exhibits no tenderness.  Neurological: He is alert and oriented to person, place, and time. He exhibits normal muscle tone. Coordination normal.  Skin:          Abscess to posterior right thigh.  Mild drainage present.  Indurated with mild surrounding erythema    ED Course  Procedures (including critical care time)    1. Abscess       MDM      INCISION AND DRAINAGE Performed by: Maxwell Caul. Consent: Verbal consent obtained. Risks and benefits: risks, benefits and alternatives were discussed Type: abscess  Body area: right posterior thigh Anesthesia: local infiltration  Local anesthetic: lidocaine 2% w/o epinephrine  Anesthetic total: 2 ml  Complexity: complex Blunt dissection to break up loculations  Drainage: purulent  Drainage amount: moderate Packing material: 1/4 in iodoform gauze  Patient tolerance: Patient tolerated the procedure well with no immediate complications.    Previous medical charts, nursing notes and vitals signs from this visit were reviewed by me   All laboratory results and/or imaging  results performed on this visit, if applicable, were reviewed by me and discussed with the patient and/or parent as well as recommendation for follow-up    MEDICATIONS GIVEN IN ED: none  Patient with known hx of MRSA infection.  Currently using Bactroban intranasally    PRESCRIPTIONS GIVEN AT DISCHARGE: doxycycline, norco # 20     Pt stable in ED with no significant deterioration in condition. Pt feels improved after observation and/or treatment in ED. Patient / Family / Caregiver understand and agree with initial ED impression and plan with expectations set for ED visit.  Patient agrees to return to ED for any worsening symptoms     Treyon Wymore L. Sha Burling, [place] 03/31/12 1504

## 2012-03-31 NOTE — ED Provider Notes (Signed)
Medical screening examination/treatment/procedure(s) were performed by non-physician practitioner and as supervising physician I was immediately available for consultation/collaboration.Devoria Albe, MD, Armando Gang   Ward Givens, MD 03/31/12 [phone]

## 2012-03-31 NOTE — ED Notes (Signed)
Abscess to right upper back thigh. Area is draining.

## 2012-03-31 NOTE — ED Notes (Signed)
Alert, Nad, has area of infection rt post thigh.

## 2012-03-31 NOTE — Discharge Instructions (Signed)

## 2012-04-02 ENCOUNTER — Encounter (HOSPITAL_COMMUNITY): Payer: Self-pay | Admitting: Emergency Medicine

## 2012-04-02 ENCOUNTER — Emergency Department (HOSPITAL_COMMUNITY)
Admission: EM | Admit: 2012-04-02 | Discharge: 2012-04-02 | Disposition: A | Discharge status: Home / Self Care | Payer: Medicaid Other | Attending: Emergency Medicine | Admitting: Emergency Medicine

## 2012-04-02 DIAGNOSIS — K219 Gastro-esophageal reflux disease without esophagitis: Secondary | ICD-10-CM | POA: Insufficient documentation

## 2012-04-02 DIAGNOSIS — Z09 Encounter for follow-up examination after completed treatment for conditions other than malignant neoplasm: Secondary | ICD-10-CM | POA: Insufficient documentation

## 2012-04-02 DIAGNOSIS — I10 Essential (primary) hypertension: Secondary | ICD-10-CM | POA: Insufficient documentation

## 2012-04-02 DIAGNOSIS — F3289 Other specified depressive episodes: Secondary | ICD-10-CM | POA: Insufficient documentation

## 2012-04-02 DIAGNOSIS — L02419 Cutaneous abscess of limb, unspecified: Secondary | ICD-10-CM | POA: Insufficient documentation

## 2012-04-02 DIAGNOSIS — L0291 Cutaneous abscess, unspecified: Secondary | ICD-10-CM

## 2012-04-02 DIAGNOSIS — Z79899 Other long term (current) drug therapy: Secondary | ICD-10-CM | POA: Insufficient documentation

## 2012-04-02 DIAGNOSIS — K7460 Unspecified cirrhosis of liver: Secondary | ICD-10-CM | POA: Insufficient documentation

## 2012-04-02 DIAGNOSIS — Z794 Long term (current) use of insulin: Secondary | ICD-10-CM | POA: Insufficient documentation

## 2012-04-02 DIAGNOSIS — E785 Hyperlipidemia, unspecified: Secondary | ICD-10-CM | POA: Insufficient documentation

## 2012-04-02 DIAGNOSIS — E119 Type 2 diabetes mellitus without complications: Secondary | ICD-10-CM | POA: Insufficient documentation

## 2012-04-02 DIAGNOSIS — Z8551 Personal history of malignant neoplasm of bladder: Secondary | ICD-10-CM | POA: Insufficient documentation

## 2012-04-02 DIAGNOSIS — L03119 Cellulitis of unspecified part of limb: Secondary | ICD-10-CM | POA: Insufficient documentation

## 2012-04-02 DIAGNOSIS — F329 Major depressive disorder, single episode, unspecified: Secondary | ICD-10-CM | POA: Insufficient documentation

## 2012-04-02 NOTE — ED Notes (Signed)
Patient with no complaints at this time. Respirations even and unlabored. Skin warm/dry. Discharge instructions reviewed with patient at this time. Patient given opportunity to voice concerns/ask questions. Patient discharged at this time and left Emergency Department with steady gait.   

## 2012-04-02 NOTE — ED Provider Notes (Signed)
History     CSN: [id number]  Arrival date & time 04/02/12  4098   First MD Initiated Contact with Patient 04/02/12 [phone]      Chief Complaint  Patient presents with  . Wound Check    (Consider location/radiation/quality/duration/timing/severity/associated sxs/prior treatment) Patient is a 56 y.o. male presenting with wound check. The history is provided by the patient.  Wound Check  He was treated in the ED 2 to 3 days ago. Previous treatment in the ED includes I&D of abscess. Treatments since wound repair include regular soap and water washings. Fever duration: no fever. There has been colored discharge from the wound. The redness has improved. The swelling has improved. The pain has improved. He has no difficulty moving the affected extremity or digit.    Past Medical History  Diagnosis Date  . DM (diabetes mellitus)   . Cirrhosis     bx proven steatohepatitis with cirrhosis (2010); per note from Feb 2011, received Hep A and B vaccines in 2010  . HTN (hypertension)   . RLS (restless legs syndrome)   . Sleep apnea   . Hyperlipidemia   . IDA (iron deficiency anemia)   . GERD (gastroesophageal reflux disease)   . Depression   . Peripheral neuropathy   . Urothelial cancer     2010, paillary low-grade, h/o recurrence 2011  . B12 deficiency   . Psoriasis   . 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 Anemia due to multiple mechanisms 05/13/2011  . History of alcohol abuse 05/13/2011  . ANEMIA-IRON DEFICIENCY 03/09/2009  . S/P endoscopy May 2012    4 columns grade II esophageal varices; due for repeat in Nov 2013   . S/P colonoscopy May 2012    Tubular adenoma  . Low back pain   . Cancer     bladder ca 05/2009 removal and  w/chemo wash  . Esophageal varices with bleeding 11/24/11    s/p emergent EGD 11/25/11 by Dr. Rhea Belton at [facility], Grade III esophageal varices s/p banding X 5  . Small bowel obstruction 02/15/2012    Admitted to [facility], managed by Dr. Leticia Penna, ventral hernia  manually reduced  . Ventral hernia 02/15/12  . MRSA (methicillin resistant Staphylococcus aureus)     Past Surgical History  Procedure Date  . Carpel tunnel   . Shoulder surgery   . Adrenal mass surgery 05/2009    benign, left  . Bladder surgery 01/2009 and 06/2010    cancer 2010, small recurrence in 06/2010  . Colonoscopy 04/2009    moderate int hemorrhoids, rare sigmoid diverticula, one mm sessile hyperplastic rectal polyp  . Esophagogastroduodenoscopy 03/2009    small hh  . Small bowel capsule endoscopy 03/2009    couple of small benign appearing erosions, nonbleeding  . Egd/tcs 08/2007    small hiatal hernia, pancolonic diverticula, friable anal canal, 3cm salmon colored epithelium in distal esophagus, bx negative for Barrett's  . Skin cancer excision Oct 2012    left arm  . Esophagogastroduodenoscopy 11/25/2011    Procedure: ESOPHAGOGASTRODUODENOSCOPY (EGD);  Surgeon: Erick Blinks, MD;  Location: [facility] ENDOSCOPY;  Service: Gastroenterology;  Laterality: Left;  . Esophagogastroduodenoscopy 01/14/2012    Dr Rourk->4-5 columns Gr2 varices, 6 bands placed, HH, distal esophageal ulcer, portal gastropathy, antral erosions  . Esophagogastroduodenoscopy 03/17/2012    Procedure: ESOPHAGOGASTRODUODENOSCOPY (EGD);  Surgeon: Corbin Ade, MD;  Location: [facility]O [facility];  Service: Endoscopy;  Laterality: N/A;  8:30    Family History  Problem Relation Age of Onset  .  Cirrhosis Father     etoh  . Colon cancer Neg Hx   . Anesthesia problems Neg Hx   . Hypotension Neg Hx   . Malignant hyperthermia Neg Hx   . Pseudochol deficiency Neg Hx   . Kidney cancer Mother   . Cancer Mother   . HIV Brother   . Cirrhosis Brother     nash    History  Substance Use Topics  . Smoking status: Current Everyday Smoker -- 1.5 packs/day for 30 years    Types: Cigarettes  . Smokeless tobacco: Not on file  . Alcohol Use: No     drank heavily for few years in 20s      Review of Systems  Constitutional:  Negative for fever and chills.  Genitourinary: Negative for testicular pain.  Musculoskeletal: Negative for back pain.  Skin:       abscess  Hematological: Negative for adenopathy.  All other systems reviewed and are negative.    Allergies  Tylenol  Home Medications   Current Outpatient Rx  Name Route Sig Dispense Refill  . DOXYCYCLINE HYCLATE 100 MG PO CAPS Oral Take 1 capsule (100 mg total) by mouth 2 (two) times daily. For 10 days 20 capsule 0  . OMEGA-3 FATTY ACIDS 1000 MG PO CAPS Oral Take 1 g by mouth 3 (three) times daily.     . FUROSEMIDE 20 MG PO TABS Oral Take 20 mg by mouth daily.     [name] GABAPENTIN 300 MG PO CAPS Oral Take 600-900 mg by mouth 2 (two) times daily. Patient takes 600mg  at about 1700 hours and then takes 900mg  at bedtime    . HYDROCODONE-ACETAMINOPHEN 5-325 MG PO TABS  Take one-two tabs po q 4-6 hrs prn pain 20 tablet 0  . INSULIN ASPART 100 UNIT/ML [place] SOLN Subcutaneous Inject 25-35 Units into the skin 2 (two) times daily. Sliding Scale Checks blood sugar 3 to 4 times daily    . INSULIN GLARGINE 100 UNIT/ML [place] SOLN Subcutaneous Inject 60 Units into the skin 2 (two) times daily. Before breakfast & Dinner    . LISINOPRIL 20 MG PO TABS Oral Take 10 mg by mouth daily.     [name] MAGNESIUM OXIDE 400 MG PO TABS Oral Take 400 mg by mouth daily.    [name] METFORMIN HCL 1000 MG PO TABS Oral Take 1,000 mg by mouth 2 (two) times daily with a meal.      . MULTIVITAMINS PO CAPS Oral Take 1 capsule by mouth daily.      [name] MUPIROCIN 2 % EX OINT Topical Apply 1 application topically 2 (two) times daily.    [name] NAPROXEN SODIUM 220 MG PO TABS Oral Take 220 mg by mouth 2 (two) times daily as needed. Pain    . OMEPRAZOLE 40 MG PO CPDR Oral Take 40 mg by mouth daily.    [name] PRAMIPEXOLE DIHYDROCHLORIDE 0.5 MG PO TABS Oral Take 0.5 mg by mouth at bedtime.     [name] PROPRANOLOL HCL 40 MG PO TABS Oral Take 40 mg by mouth 2 (two) times daily.    [name] SIMVASTATIN 20 MG PO TABS Oral Take 20 mg by mouth every  evening.    . TRAZODONE HCL 50 MG PO TABS Oral Take 50 mg by mouth at bedtime.      BP 118/79  Pulse 78  Temp(Src) 98.6 F (37 C) (Oral)  Resp 17  Ht 5\' 7"  (1.702 m)  Wt 231 lb (104.781 kg)  BMI 36.18 kg/m2  SpO2 95%  Physical Exam  Nursing note and vitals reviewed. Constitutional: He is oriented to person, place, and time. He appears well-developed and well-nourished. No distress.  Cardiovascular: Normal rate, regular rhythm and normal heart sounds.   Pulmonary/Chest: Effort normal and breath sounds normal.  Musculoskeletal: He exhibits no tenderness.  Neurological: He is alert and oriented to person, place, and time.  Skin:       Abscess to right posterior upper leg with previous incision and drainage. Packing is present with mild to moderate drainage present. Appears to be healing well.    ED Course  Procedures (including critical care time)       MDM    Previous medical charts, nursing notes and vitals signs from this visit were reviewed by me   All laboratory results and/or imaging results performed on this visit, if applicable, were reviewed by me and discussed with the patient and/or parent as well as recommendation for follow-up    MEDICATIONS GIVEN IN ED:  none  Abscess to right posterior thigh, mild to moderate drainage present.  Previous I&D was performed by me.  Pain improved, surrounding erythema also improved.  Wound was repacked by me using 1/4" iodoform gauze.  Patient advised to continue antibiotics.        PRESCRIPTIONS GIVEN AT DISCHARGE:  none     Pt stable in ED with no significant deterioration in condition. Pt feels improved after observation and/or treatment in ED. Patient / Family / Caregiver understand and agree with initial ED impression and plan with expectations set for ED visit.  Patient agrees to return to ED for any worsening symptoms        Camden Knotek L. Livingston Wheeler, [place] 04/02/12 [phone]

## 2012-04-02 NOTE — ED Notes (Signed)
Tammy Triplett, PA at bedside. 

## 2012-04-02 NOTE — ED Notes (Signed)
Pt had an i&d of an abscess on the back of his rt thigh and is here to have it rechecked per discharge orders.

## 2012-04-02 NOTE — ED Provider Notes (Signed)
Medical screening examination/treatment/procedure(s) were performed by non-physician practitioner and as supervising physician I was immediately available for consultation/collaboration.   Dayton Bailiff, MD 04/02/12 1319

## 2012-04-02 NOTE — Discharge Instructions (Signed)
Abscess An abscess (boil or furuncle) is an infected area that contains a collection of pus.  SYMPTOMS Signs and symptoms of an abscess include pain, tenderness, redness, or hardness. You may feel a moveable soft area under your skin. An abscess can occur anywhere in the body.  TREATMENT  A surgical cut (incision) may be made over your abscess to drain the pus. Gauze may be packed into the space or a drain may be looped through the abscess cavity (pocket). This provides a drain that will allow the cavity to heal from the inside outwards. The abscess may be painful for a few days, but should feel much better if it was drained.  Your abscess, if seen early, may not have localized and may not have been drained. If not, another appointment may be required if it does not get better on its own or with medications. HOME CARE INSTRUCTIONS   Only take over-the-counter or prescription medicines for pain, discomfort, or fever as directed by your caregiver.   Take your antibiotics as directed if they were prescribed. Finish them even if you start to feel better.   Keep the skin and clothes clean around your abscess.   If the abscess was drained, you will need to use gauze dressing to collect any draining pus. Dressings will typically need to be changed 3 or more times a day.   The infection may spread by skin contact with others. Avoid skin contact as much as possible.   Practice good hygiene. This includes regular hand washing, cover any draining skin lesions, and do not share personal care items.   If you participate in sports, do not share athletic equipment, towels, whirlpools, or personal care items. Shower after every practice or tournament.   If a draining area cannot be adequately covered:   Do not participate in sports.   Children should not participate in day care until the wound has healed or drainage stops.   If your caregiver has given you a follow-up appointment, it is very important  to keep that appointment. Not keeping the appointment could result in a much worse infection, chronic or permanent injury, pain, and disability. If there is any problem keeping the appointment, you must call back to this facility for assistance.  SEEK MEDICAL CARE IF:   You develop increased pain, swelling, redness, drainage, or bleeding in the wound site.   You develop signs of generalized infection including muscle aches, chills, fever, or a general ill feeling.   You have an oral temperature above 102 F (38.9 C).  MAKE SURE YOU:   Understand these instructions.   Will watch your condition.   Will get help right away if you are not doing well or get worse.  Document Released: 08/20/2005 Document Revised: 10/30/2011 Document Reviewed: 06/13/2008 Exit[employer] Patient Information 2012 [employer].Abscess Care After An abscess (also called a boil or furuncle) is an infected area that contains a collection of pus. Signs and symptoms of an abscess include pain, tenderness, redness, or hardness, or you may feel a moveable soft area under your skin. An abscess can occur anywhere in the body. The infection may spread to surrounding tissues causing cellulitis. A cut (incision) by the surgeon was made over your abscess and the pus was drained out. Gauze may have been packed into the space to provide a drain that will allow the cavity to heal from the inside outwards. The boil may be painful for 5 to 7 days. Most people with a boil   do not have high fevers. Your abscess, if seen early, may not have localized, and may not have been lanced. If not, another appointment may be required for this if it does not get better on its own or with medications. HOME CARE INSTRUCTIONS   Only take over-the-counter or prescription medicines for pain, discomfort, or fever as directed by your caregiver.   When you bathe, soak and then remove gauze or iodoform packs at least daily or as directed by your caregiver. You may  then wash the wound gently with mild soapy water. Repack with gauze or do as your caregiver directs.  SEEK IMMEDIATE MEDICAL CARE IF:   You develop increased pain, swelling, redness, drainage, or bleeding in the wound site.   You develop signs of generalized infection including muscle aches, chills, fever, or a general ill feeling.   An oral temperature above 102 F (38.9 C) develops, not controlled by medication.  See your caregiver for a recheck if you develop any of the symptoms described above. If medications (antibiotics) were prescribed, take them as directed. Document Released: 05/29/2005 Document Revised: 10/30/2011 Document Reviewed: 01/24/2008 Exit[employer] Patient Information 2012 [employer]. 

## 2012-04-05 ENCOUNTER — Encounter (HOSPITAL_COMMUNITY)
Admission: RE | Disposition: A | Discharge status: Home / Self Care | Payer: Self-pay | Source: Ambulatory Visit | Attending: General Surgery

## 2012-04-05 ENCOUNTER — Encounter (HOSPITAL_COMMUNITY): Payer: Self-pay | Admitting: Anesthesiology

## 2012-04-05 ENCOUNTER — Ambulatory Visit (HOSPITAL_COMMUNITY): Payer: Medicaid Other | Admitting: Anesthesiology

## 2012-04-05 ENCOUNTER — Ambulatory Visit (HOSPITAL_COMMUNITY)
Admission: RE | Admit: 2012-04-05 | Discharge: 2012-04-05 | Disposition: A | Discharge status: Home / Self Care | Payer: Medicaid Other | Source: Ambulatory Visit | Attending: General Surgery | Admitting: General Surgery

## 2012-04-05 ENCOUNTER — Encounter (HOSPITAL_COMMUNITY): Payer: Self-pay

## 2012-04-05 DIAGNOSIS — Z01812 Encounter for preprocedural laboratory examination: Secondary | ICD-10-CM | POA: Insufficient documentation

## 2012-04-05 DIAGNOSIS — Z79899 Other long term (current) drug therapy: Secondary | ICD-10-CM | POA: Insufficient documentation

## 2012-04-05 DIAGNOSIS — D696 Thrombocytopenia, unspecified: Secondary | ICD-10-CM | POA: Insufficient documentation

## 2012-04-05 DIAGNOSIS — E119 Type 2 diabetes mellitus without complications: Secondary | ICD-10-CM | POA: Insufficient documentation

## 2012-04-05 DIAGNOSIS — E785 Hyperlipidemia, unspecified: Secondary | ICD-10-CM | POA: Insufficient documentation

## 2012-04-05 DIAGNOSIS — Z794 Long term (current) use of insulin: Secondary | ICD-10-CM | POA: Insufficient documentation

## 2012-04-05 DIAGNOSIS — I10 Essential (primary) hypertension: Secondary | ICD-10-CM | POA: Insufficient documentation

## 2012-04-05 DIAGNOSIS — K7460 Unspecified cirrhosis of liver: Secondary | ICD-10-CM | POA: Insufficient documentation

## 2012-04-05 DIAGNOSIS — K429 Umbilical hernia without obstruction or gangrene: Secondary | ICD-10-CM

## 2012-04-05 HISTORY — PX: UMBILICAL HERNIA REPAIR: SHX196

## 2012-04-05 SURGERY — REPAIR, HERNIA, UMBILICAL, ADULT
Anesthesia: General | Site: Abdomen | Wound class: Clean

## 2012-04-05 MED ORDER — BUPIVACAINE HCL (PF) 0.5 % IJ SOLN
INTRAMUSCULAR | Status: AC
  Filled 2012-04-05: qty 30

## 2012-04-05 MED ORDER — SUCCINYLCHOLINE CHLORIDE 20 MG/ML IJ SOLN
INTRAMUSCULAR | Status: DC | PRN
  Administered 2012-04-05: 120 mg via INTRAVENOUS

## 2012-04-05 MED ORDER — GLYCOPYRROLATE 0.2 MG/ML IJ SOLN
INTRAMUSCULAR | Status: AC
  Administered 2012-04-05: 0.2 mg via INTRAVENOUS
  Filled 2012-04-05: qty 1

## 2012-04-05 MED ORDER — SUCCINYLCHOLINE CHLORIDE 20 MG/ML IJ SOLN
INTRAMUSCULAR | Status: AC
  Filled 2012-04-05: qty 1

## 2012-04-05 MED ORDER — ONDANSETRON HCL 4 MG/2ML IJ SOLN: 4.0000 mg | Freq: Once | INTRAMUSCULAR | Status: DC | PRN

## 2012-04-05 MED ORDER — NALOXONE HCL 0.4 MG/ML IJ SOLN
INTRAMUSCULAR | Status: DC | PRN
  Administered 2012-04-05: 0.1 mg via INTRAVENOUS

## 2012-04-05 MED ORDER — METOCLOPRAMIDE HCL 10 MG PO TABS
10.0000 mg | ORAL_TABLET | Freq: Once | ORAL | Status: DC
  Filled 2012-04-05: qty 1

## 2012-04-05 MED ORDER — GLYCOPYRROLATE 0.2 MG/ML IJ SOLN
INTRAMUSCULAR | Status: DC | PRN
  Administered 2012-04-05: 0.4 mg via INTRAVENOUS

## 2012-04-05 MED ORDER — GLYCOPYRROLATE 0.2 MG/ML IJ SOLN
0.2000 mg | Freq: Once | INTRAMUSCULAR | Status: AC
  Administered 2012-04-05: 0.2 mg via INTRAVENOUS

## 2012-04-05 MED ORDER — PROPOFOL 10 MG/ML IV EMUL
INTRAVENOUS | Status: AC
  Filled 2012-04-05: qty 20

## 2012-04-05 MED ORDER — FENTANYL CITRATE 0.05 MG/ML IJ SOLN
INTRAMUSCULAR | Status: DC | PRN
  Administered 2012-04-05 (×2): 50 ug via INTRAVENOUS

## 2012-04-05 MED ORDER — FENTANYL CITRATE 0.05 MG/ML IJ SOLN
25.0000 ug | INTRAMUSCULAR | Status: DC | PRN
  Administered 2012-04-05 (×2): 50 ug via INTRAVENOUS

## 2012-04-05 MED ORDER — ROCURONIUM BROMIDE 50 MG/5ML IV SOLN
INTRAVENOUS | Status: AC
  Filled 2012-04-05: qty 1

## 2012-04-05 MED ORDER — CEFAZOLIN SODIUM 1 G IJ SOLR
INTRAMUSCULAR | Status: AC
  Administered 2012-04-05: 1 g via INTRAMUSCULAR
  Filled 2012-04-05: qty 10

## 2012-04-05 MED ORDER — GLYCOPYRROLATE 0.2 MG/ML IJ SOLN
INTRAMUSCULAR | Status: AC
  Filled 2012-04-05: qty 1

## 2012-04-05 MED ORDER — LIDOCAINE HCL (CARDIAC) 10 MG/ML IV SOLN
INTRAVENOUS | Status: DC | PRN
  Administered 2012-04-05: 10 mg via INTRAVENOUS

## 2012-04-05 MED ORDER — ONDANSETRON HCL 4 MG/2ML IJ SOLN
INTRAMUSCULAR | Status: AC
  Filled 2012-04-05: qty 2

## 2012-04-05 MED ORDER — BUPIVACAINE HCL (PF) 0.5 % IJ SOLN
INTRAMUSCULAR | Status: DC | PRN
  Administered 2012-04-05: 10 mL

## 2012-04-05 MED ORDER — CEFAZOLIN SODIUM 1-5 GM-% IV SOLN: 1.0000 g | Freq: Once | INTRAVENOUS | Status: DC

## 2012-04-05 MED ORDER — LACTATED RINGERS IV SOLN
INTRAVENOUS | Status: DC
  Administered 2012-04-05: 1000 mL via INTRAVENOUS

## 2012-04-05 MED ORDER — MIDAZOLAM HCL 2 MG/2ML IJ SOLN
INTRAMUSCULAR | Status: AC
  Administered 2012-04-05: 2 mg via INTRAVENOUS
  Filled 2012-04-05: qty 2

## 2012-04-05 MED ORDER — METOCLOPRAMIDE HCL 5 MG/ML IJ SOLN
INTRAMUSCULAR | Status: AC
  Administered 2012-04-05: 10 mg
  Filled 2012-04-05: qty 2

## 2012-04-05 MED ORDER — ROCURONIUM BROMIDE 100 MG/10ML IV SOLN
INTRAVENOUS | Status: DC | PRN
  Administered 2012-04-05: 25 mg via INTRAVENOUS
  Administered 2012-04-05: 5 mg via INTRAVENOUS

## 2012-04-05 MED ORDER — FENTANYL CITRATE 0.05 MG/ML IJ SOLN
INTRAMUSCULAR | Status: AC
  Administered 2012-04-05: 50 ug via INTRAVENOUS
  Filled 2012-04-05: qty 2

## 2012-04-05 MED ORDER — HYDROCODONE-IBUPROFEN 5-200 MG PO TABS: 1.0000 | ORAL_TABLET | ORAL | Status: DC | PRN

## 2012-04-05 MED ORDER — NEOSTIGMINE METHYLSULFATE 1 MG/ML IJ SOLN
INTRAMUSCULAR | Status: DC | PRN
  Administered 2012-04-05: 2 mg via INTRAVENOUS

## 2012-04-05 MED ORDER — SODIUM CHLORIDE 0.9 % IR SOLN
Status: DC | PRN
  Administered 2012-04-05: 1000 mL

## 2012-04-05 MED ORDER — PROPOFOL 10 MG/ML IV EMUL
INTRAVENOUS | Status: DC | PRN
  Administered 2012-04-05: 30 mg via INTRAVENOUS
  Administered 2012-04-05: 150 mg via INTRAVENOUS

## 2012-04-05 MED ORDER — NALOXONE HCL 0.4 MG/ML IJ SOLN
INTRAMUSCULAR | Status: AC
  Filled 2012-04-05: qty 1

## 2012-04-05 MED ORDER — MIDAZOLAM HCL 2 MG/2ML IJ SOLN
1.0000 mg | INTRAMUSCULAR | Status: DC | PRN
  Administered 2012-04-05: 2 mg via INTRAVENOUS

## 2012-04-05 SURGICAL SUPPLY — 34 items
BAG HAMPER (MISCELLANEOUS) ×2 IMPLANT
BENZOIN TINCTURE PRP APPL 2/3 (GAUZE/BANDAGES/DRESSINGS) ×2 IMPLANT
CLOTH BEACON ORANGE TIMEOUT ST (SAFETY) ×2 IMPLANT
CLSR STERI-STRIP ANTIMIC 1/2X4 (GAUZE/BANDAGES/DRESSINGS) ×2 IMPLANT
COVER LIGHT HANDLE STERIS (MISCELLANEOUS) ×4 IMPLANT
DECANTER SPIKE VIAL GLASS SM (MISCELLANEOUS) ×2 IMPLANT
DURAPREP 26ML APPLICATOR (WOUND CARE) ×2 IMPLANT
ELECT REM PT RETURN 9FT ADLT (ELECTROSURGICAL) ×2
ELECTRODE REM PT RTRN 9FT ADLT (ELECTROSURGICAL) ×1 IMPLANT
GLOVE BIOGEL PI IND STRL 7.5 (GLOVE) ×1 IMPLANT
GLOVE BIOGEL PI INDICATOR 7.5 (GLOVE) ×1
GLOVE ECLIPSE 7.0 STRL STRAW (GLOVE) ×2 IMPLANT
GLOVE INDICATOR 7.0 STRL GRN (GLOVE) ×2 IMPLANT
GLOVE SS BIOGEL STRL SZ 6.5 (GLOVE) ×1 IMPLANT
GLOVE SUPERSENSE BIOGEL SZ 6.5 (GLOVE) ×1
GOWN STRL REIN XL XLG (GOWN DISPOSABLE) ×6 IMPLANT
INST SET MINOR GENERAL (KITS) ×2 IMPLANT
KIT ROOM TURNOVER APOR (KITS) ×2 IMPLANT
MANIFOLD NEPTUNE II (INSTRUMENTS) ×2 IMPLANT
NEEDLE HYPO 25X1 1.5 SAFETY (NEEDLE) ×2 IMPLANT
NS IRRIG 1000ML POUR BTL (IV SOLUTION) ×2 IMPLANT
PACK MINOR (CUSTOM PROCEDURE TRAY) ×2 IMPLANT
PAD ARMBOARD 7.5X6 YLW CONV (MISCELLANEOUS) ×2 IMPLANT
SET BASIN LINEN APH (SET/KITS/TRAYS/PACK) ×2 IMPLANT
STRIP CLOSURE SKIN 1/2X4 (GAUZE/BANDAGES/DRESSINGS) ×2 IMPLANT
SUT ETHIBOND CT1 BRD 2-0 30IN (SUTURE) IMPLANT
SUT ETHIBOND NAB MO 7 #0 18IN (SUTURE) ×2 IMPLANT
SUT MNCRL AB 4-0 PS2 18 (SUTURE) ×2 IMPLANT
SUT VIC AB 2-0 CT2 27 (SUTURE) IMPLANT
SUT VIC AB 3-0 SH 27 (SUTURE) ×1
SUT VIC AB 3-0 SH 27X BRD (SUTURE) ×1 IMPLANT
SUT VICRYL AB 3 0 TIES (SUTURE) IMPLANT
SYR BULB IRRIGATION 50ML (SYRINGE) IMPLANT
SYR CONTROL 10ML LL (SYRINGE) ×2 IMPLANT

## 2012-04-05 NOTE — Discharge Instructions (Addendum)
Umbilical Herniorrhaphy A herniorrhaphy is surgery to repair a hernia. A hernia is a gap or weakness in the muscles of your abdomen. Umbilical means that your hernia is in the area around your belly button. You might be able to feel a small bulge in your abdomen where the hernia is. You might also have pain or discomfort. If the hernia is not repaired, the gap could get bigger. Your intestines could get trapped in the gap. This can be painful. It also can lead to other health problems, such as blocked intestines. LET YOUR CAREGIVER KNOW ABOUT:  Any allergies.   All medications you are taking, including:   Herbs, eyedrops, over-the-counter medications and cream   Blood thinners (anticoagulants), aspirin or other drugs that could affect blood clotting.   Use of steroids (by mouth or as creams).   Previous problems with anesthetics, including local anesthetics.   Possibility of pregnancy, if this applies.   Any history of blood clots.   Any history of bleeding or other blood problems.   Previous surgery.   Smoking history.   Other health problems.  RISKS AND COMPLICATIONS  Short-term possibilities include:   Pain.   Excessive bleeding.   Hematoma. This is a pooling of blood under the wound.   Infection at the surgery site or of the mesh.   Numbness at the surgery site.   Swelling and bruising.   Slow healing.   Blood clots.   Intestine or bowel damage. This is rare.   Longer-term possibilities include:   Scarring.   Skin damage.   The need for additional surgery.   Another hernia.  BEFORE THE PROCEDURE  Stop using aspirin and non-steroidal anti-inflammatory drugs (NSAIDs) for pain relief. Also stop taking vitamin E. If possible, do this two weeks in advance.   If you take blood-thinners, ask your healthcare provider when you should stop taking them.   Do not eat or drink for about 8 hours before your surgery.   You might be asked to shower or wash with a  special antibacterial soap before the procedure.   Wear clothes that will be easy to put on after the surgery.   Arrive at least an hour before the surgery, or whenever your surgeon recommends. This will give you time to check in and fill out any needed paperwork.   This surgery is usually an outpatient procedure, so you will be able to go home the same day. Less often people stay overnight in the hospital after the procedure. Ask your healthcare provider what to expect. Either way, make arrangements in advance for someone to drive you home. After an outpatient procedure, you should have someone stay with you overnight.  PROCEDURE Your procedure can be done with traditional surgery. The surgeon opens the abdomen and repairs the hernia. Or, sometimes it can be done with laparoscopic surgery. Then the procedure is done through multiple small incisions, using a camera to guide the repair. Talk with your surgeon about how the hernia will be repaired.  The preparation:   You will change into a hospital gown.   You will be given an IV. A needle will be inserted in your arm. Medication can flow directly into your body through this needle.   You might be given a sedative to help you relax.   You may be given a drug that will put you to sleep during the surgery (general anesthetic). Or, your abdomen will be numbed, and you will be drowsy but awake (local   anesthetic).   For a traditional surgery (sometimes called open surgery):   Once you are pain-free, the surgeon will make a small cut (incision) in your abdomen.   The gap in the muscle wall will be repaired. The surgeon could sew muscle together over the gap. Or, a mesh or soft screen material can be used to strengthen the area. The mesh acts as a scaffolding and the body grows new strong tissue into and around it. This new tissue is what closes the gap of the hernia and prevents it from coming back.   A drain might be put in. Fluid sometimes  collects under the wound as it heals. The drain helps get the fluid out of the area. A drain will probably be used if your hernia is large.   The surgeon will close the incision with small stitches.   For a laparoscopic surgery:   One you are pain-free, the surgeon will make a small incision in your abdomen.   A thin tube with a tiny camera attached to it will be inserted into the abdomen through the incision. What the camera "sees" is projected on a screen in the room. This gives the surgeon a good view of the hernia.   Other small incisions will be made so the surgeon can insert small tools that are used to repair the hernia.   The incisions will be closed with small stitches.  AFTER THE PROCEDURE  You will be stay in a recovery area until the anesthesia has worn off. Your blood pressure and pulse will be checked every so often.   You might be asked to get up and try walking.   Sometimes people can go home the same day. For others, an overnight stay is needed.  HOME CARE INSTRUCTIONS   Take any medication that your surgeon prescribed. Follow the directions carefully. Take all of the medication.   Ask your surgeon whether you can take over-the-counter medicines for pain, discomfort or fever. Do not take aspirin unless your healthcare team says that you should. Aspirin increases the chances of bleeding.   Do not get the incision area wet for the first few days after surgery (or until your surgeon says it is OK).   Avoid lifting heavy objects (more than 10 lbs, 4.5 kilograms) for 6 to 8 weeks after your surgery.   Expect some pain when climbing stairs for several days after surgery.   Avoid sexual activity for a few weeks. It can be uncomfortable or painful.   You should be able to drive within a few days. However, do not drive until you are no longer taking pain medicine. It can make you drowsy. It also can slow your reaction time.   You should be able to resume normal activity in  a few days. When you can return to work will depend on the type of work you do. You can go back to a desk job much sooner than you can return to work that requires physical labor. Talk about this with your healthcare provider.  SEEK MEDICAL CARE IF:   You notice blood or fluid leaking from the wound.   The area around the incision becomes red or swollen.   You are having problems urinating.   You become nauseous or throw up for more than two days after the surgery.   You develop a fever of more than 100.5 F (38.1 C).  SEEK IMMEDIATE MEDICAL CARE IF:  You develop a fever of more than   102.0 F (38.9 C). Document Released: 02/06/2009 Document Revised: 10/30/2011 Document Reviewed: 02/06/2009 [facility] Patient Information 2012 [place], [place].Umbilical Herniorrhaphy A herniorrhaphy is surgery to repair a hernia. A hernia is a gap or weakness in the muscles of your abdomen. Umbilical means that your hernia is in the area around your belly button. You might be able to feel a small bulge in your abdomen where the hernia is. You might also have pain or discomfort. If the hernia is not repaired, the gap could get bigger. Your intestines could get trapped in the gap. This can be painful. It also can lead to other health problems, such as blocked intestines. LET YOUR CAREGIVER KNOW ABOUT:  Any allergies.   All medications you are taking, including:   Herbs, eyedrops, over-the-counter medications and cream   Blood thinners (anticoagulants), aspirin or other drugs that could affect blood clotting.   Use of steroids (by mouth or as creams).   Previous problems with anesthetics, including local anesthetics.   Possibility of pregnancy, if this applies.   Any history of blood clots.   Any history of bleeding or other blood problems.   Previous surgery.   Smoking history.   Other health problems.  RISKS AND COMPLICATIONS  Short-term possibilities include:   Pain.   Excessive bleeding.    Hematoma. This is a pooling of blood under the wound.   Infection at the surgery site or of the mesh.   Numbness at the surgery site.   Swelling and bruising.   Slow healing.   Blood clots.   Intestine or bowel damage. This is rare.   Longer-term possibilities include:   Scarring.   Skin damage.   The need for additional surgery.   Another hernia.  BEFORE THE PROCEDURE  Stop using aspirin and non-steroidal anti-inflammatory drugs (NSAIDs) for pain relief. Also stop taking vitamin E. If possible, do this two weeks in advance.   If you take blood-thinners, ask your healthcare provider when you should stop taking them.   Do not eat or drink for about 8 hours before your surgery.   You might be asked to shower or wash with a special antibacterial soap before the procedure.   Wear clothes that will be easy to put on after the surgery.   Arrive at least an hour before the surgery, or whenever your surgeon recommends. This will give you time to check in and fill out any needed paperwork.   This surgery is usually an outpatient procedure, so you will be able to go home the same day. Less often people stay overnight in the hospital after the procedure. Ask your healthcare provider what to expect. Either way, make arrangements in advance for someone to drive you home. After an outpatient procedure, you should have someone stay with you overnight.  PROCEDURE Your procedure can be done with traditional surgery. The surgeon opens the abdomen and repairs the hernia. Or, sometimes it can be done with laparoscopic surgery. Then the procedure is done through multiple small incisions, using a camera to guide the repair. Talk with your surgeon about how the hernia will be repaired.  The preparation:   You will change into a hospital gown.   You will be given an IV. A needle will be inserted in your arm. Medication can flow directly into your body through this needle.   You might be  given a sedative to help you relax.   You may be given a drug that will put you to sleep  during the surgery (general anesthetic). Or, your abdomen will be numbed, and you will be drowsy but awake (local anesthetic).   For a traditional surgery (sometimes called open surgery):   Once you are pain-free, the surgeon will make a small cut (incision) in your abdomen.   The gap in the muscle wall will be repaired. The surgeon could sew muscle together over the gap. Or, a mesh or soft screen material can be used to strengthen the area. The mesh acts as a scaffolding and the body grows new strong tissue into and around it. This new tissue is what closes the gap of the hernia and prevents it from coming back.   A drain might be put in. Fluid sometimes collects under the wound as it heals. The drain helps get the fluid out of the area. A drain will probably be used if your hernia is large.   The surgeon will close the incision with small stitches.   For a laparoscopic surgery:   One you are pain-free, the surgeon will make a small incision in your abdomen.   A thin tube with a tiny camera attached to it will be inserted into the abdomen through the incision. What the camera "sees" is projected on a screen in the room. This gives the surgeon a good view of the hernia.   Other small incisions will be made so the surgeon can insert small tools that are used to repair the hernia.   The incisions will be closed with small stitches.  AFTER THE PROCEDURE  You will be stay in a recovery area until the anesthesia has worn off. Your blood pressure and pulse will be checked every so often.   You might be asked to get up and try walking.   Sometimes people can go home the same day. For others, an overnight stay is needed.  HOME CARE INSTRUCTIONS   Take any medication that your surgeon prescribed. Follow the directions carefully. Take all of the medication.   Ask your surgeon whether you can take  over-the-counter medicines for pain, discomfort or fever. Do not take aspirin unless your healthcare team says that you should. Aspirin increases the chances of bleeding.   Do not get the incision area wet for the first few days after surgery (or until your surgeon says it is OK).   Avoid lifting heavy objects (more than 10 lbs, 4.5 kilograms) for 6 to 8 weeks after your surgery.   Expect some pain when climbing stairs for several days after surgery.   Avoid sexual activity for a few weeks. It can be uncomfortable or painful.   You should be able to drive within a few days. However, do not drive until you are no longer taking pain medicine. It can make you drowsy. It also can slow your reaction time.   You should be able to resume normal activity in a few days. When you can return to work will depend on the type of work you do. You can go back to a desk job much sooner than you can return to work that requires physical labor. Talk about this with your healthcare provider.  SEEK MEDICAL CARE IF:   You notice blood or fluid leaking from the wound.   The area around the incision becomes red or swollen.   You are having problems urinating.   You become nauseous or throw up for more than two days after the surgery.   You develop a fever of more than  100.5 F (38.1 C).  SEEK IMMEDIATE MEDICAL CARE IF:  You develop a fever of more than 102.0 F (38.9 C). Document Released: 02/06/2009 Document Revised: 10/30/2011 Document Reviewed: 02/06/2009 [facility] Patient Information 2012 [place], [place].

## 2012-04-05 NOTE — Anesthesia Preprocedure Evaluation (Signed)
Anesthesia Evaluation  Patient identified by MRN, date of birth, ID band Patient awake    Reviewed: Allergy & Precautions, H&P , NPO status   History of Anesthesia Complications Negative for: history of anesthetic complications  Airway Mallampati: I      Dental  (+) Teeth Intact and Poor Dentition   Pulmonary sleep apnea , COPDCurrent Smoker,  breath sounds clear to auscultation        Cardiovascular hypertension, Pt. on medications Rhythm:Regular Rate:Normal     Neuro/Psych PSYCHIATRIC DISORDERS Depression  Neuromuscular disease    GI/Hepatic GERD-  Medicated and Controlled,  Endo/Other  Diabetes mellitus-, Well Controlled, Type 2, Insulin Dependent  Renal/GU      Musculoskeletal   Abdominal   Peds  Hematology  (+) Blood dyscrasia, anemia ,   Anesthesia Other Findings   Reproductive/Obstetrics                           Anesthesia Physical Anesthesia Plan  ASA: III  Anesthesia Plan: General   Post-op Pain Management:    Induction: Intravenous  Airway Management Planned: Oral ETT  Additional Equipment:   Intra-op Plan:   Post-operative Plan: Extubation in OR  Informed Consent:   Plan Discussed with:   Anesthesia Plan Comments:         Anesthesia Quick Evaluation

## 2012-04-05 NOTE — H&P (Signed)
  NTS SOAP Note  Vital Signs:  Vitals as of: 03/25/2012: Systolic 131: Diastolic 82: Heart Rate 82: Temp 98.62F: Height 51ft 7in: Weight 232Lbs 0 Ounces: OFC Not Entered: Respiratory Rate Not Entered: O2 Saturation Not Entered: Pain Level 3: BMI 36  BMI : 36.34 kg/m2  Subjective: This 54 Years 50 Months old Male presents for of umbilical hernia.  Patient known to me from prior hospitalization.  Patient had a difficult to reduce hernia that appeared to be the transition point for an SBO.  It was reduced and symptoms resolved.  Since his hospitalization, he has noted more episodes of protrusion of the hernia and more difficultly reducing.  NO signs or symptoms of incarceration or strangulation.  Patient has some discomfort around the area after straining.  No fever or chills.  No abnormal bleeding episodes.  Review of Symptoms:  Constitutional:unremarkable Head:unremarkable Eyes:unremarkable Nose/Mouth/Throat:unremarkable Cardiovascular:unremarkable Respiratory:unremarkable as per HPI Genitourinary:unremarkable Musculoskeletal:unremarkablelower back pain. Skin:unremarkable Breast:unremarkable Hematolgic/Lymphatic:unremarkable Allergic/Immunologic:unremarkable   Past Medical History:Reviewed   Past Medical History  Surgical History: carpel tunnel, adrenal surgery, TURB, skin cancer excision. Medical Problems: urothelial cancer, anemia, thrombocytopenia, ventral hernia, EtOH abuse history, psoriasis, GERD, HTN, DM, cirrhosis, sleep apnea, hyperlipidemia, restless leg syndrome. Psychiatric History: depression Allergies: NKDA Medications: bentyl, lasix, neurontin, novolog, lantus, prinivil, zestril, glucophage, prilosec, mirapex, inderal, zocor, desyrel   Social History:Reviewed  Social History  No current EtOH use.  Prior heavy use. No rec drug use.   Smoking Status: Current every day smoker reviewed on 03/28/2012 Started Date:  11/24/1976 Packs per day: 1.50   Family History:Reviewed   Family History  non-contributory    Objective Information: General:Well appearing, well nourished in no distress. Skin:no rash or prominent lesions Head:Atraumatic; no masses; no abnormalities Eyes:conjunctiva clear, EOM intact, PERRL Mouth:Mucous membranes moist, no mucosal lesions. Neck:Supple without lymphadenopathy.  Heart:RRR, no murmur Lungs:CTA bilaterally, no wheezes, rhonchi, rales.  Breathing unlabored. Abdomen:Soft, NT/ND, protuberant, no masses.  + reducible umbilical hernia.  Scar indented from prior adrenal surgery. Extremities:No deformities, clubbing, cyanosis, or edema.   Assessment:  Diagnosis &amp; Procedure: DiagnosisCode: 553.1, ProcedureCode: 16109,    Plan: Umbilical hernia.  Risks discussed especially given history of cirrhosis.  Possible need for platelets pre-op given work-up.  Will schedule at his convenience.   Patient Education:Alternative treatments to surgery were discussed with patient (and family).Risks and benefits  of procedure were fully explained to the patient (and family) who gave informed consent. Patient/family questions were addressed.  Follow-up:Pending Surgery                                     Active Diagnosis and Procedures: 553.1 Umbilical hernia without mention of obstruction or gangrene   60454 - OFFICE OUTPATIENT VISIT 15 MINUTES

## 2012-04-05 NOTE — Anesthesia Postprocedure Evaluation (Signed)
Anesthesia Post Note  Patient: Kyle [name]  Procedure(s) Performed: Procedure(s) (LRB): HERNIA REPAIR UMBILICAL ADULT (N/A)  Anesthesia type: General  Patient location: PACU  Post pain: Pain level controlled  Post assessment: Post-op Vital signs reviewed, Patient's Cardiovascular Status Stable, Respiratory Function Stable, Patent Airway, No signs of Nausea or vomiting and Pain level controlled  Last Vitals:  Filed Vitals:   04/05/12 1217  BP: 123/76  Pulse: 77  Temp: 36.4 C  Resp: 26    Post vital signs: Reviewed and stable  Level of consciousness: awake and alert   Complications: No apparent anesthesia complications

## 2012-04-05 NOTE — Op Note (Signed)
Patient:  Kyle [name]  DOB:  1956/08/31  MRN:  [id number]   Preop Diagnosis:  Umbilical hernia  Postop Diagnosis:  The same  Procedure:  Umbilical hernia repair  Surgeon:  Dr. Tilford Pillar  Anes:  General endotracheal, 0.5% Sensorcaine plain for local  Indications:  Patient is a 56 year old male presented to my office with a known history of an umbilical hernia. This is slowly been increasing in size. Risks benefits alternatives of repair were discussed at length patient including increased risk of ascitic leak into his history of cirrhosis and peritoneal ascites. Risk including but not limited to risk of bleeding, infection, possible infection and mesh and mesh be required as well as recurrences were all discussed with the patient. His questions and concerns were addressed the patient was consented for the planned procedure.  Procedure note:  Due to the patient's history of cirrhosis and thrombocytopenia patient was given a platelet transfusion prior to proceeding to the operating room. He tolerated this well. He was taken to the operating room was placed in supine position the operator table. This point general anesthetic is a Optician, dispensing. Once patient was asleep he was endotracheally intubated by the nurse anesthetist. At this point his abdomen upper DuraPrep solution and draped in standard fashion. A semilunar incision was created supraumbilically with a 15 blade scalpel with additional dissection down to subcuticular tissues carried out using a letter cautery. This dissection is carried out down to the fascia. Both the hernia and umbilical stalk or only dissected around using a hemostat. The umbilical stalk is dissected off of the hernia sac. The fascial defect is identified and is circumferentially freed using combination of finger dissection and electrocautery. The redundant hernia sac is excised and sent as a permanent specimen to pathology. Hemostasis is excellent. There is some ascites  noted. The fascial defect is easily reapproximated with Ethibond sutures. I did perform a combination of simple interrupted Ethibond sutures as well as a running locking 0 Ethibond suture. As quite pleased with the appearance of the closure. The wound is irrigated. Local anesthetic was instilled. The umbilical stalk was reattached to the fascia with a 2-0 Vicryl. The deep subcuticular tissues reapproximated with 3-0 Vicryl in simple interrupted fashion. A 4-0 Monocryl utilized reapproximate the skin edges in a running subcuticular suture. The skin was washed dried moist dry towel. Benzoin is applied around incision. Half-inch Steri-Strips are placed. The drapes removed the patient was allowed to come out of general anesthetic history history to the postanesthetic care unit in stable condition. At the conclusion of procedure all instrument, sponge, needle counts are correct. Patient tolerated procedure extremely well.  Complications:  None apparent  EBL:  Scant  Specimen:  Hernia sac

## 2012-04-05 NOTE — Progress Notes (Signed)
Robinul 0.2 mg only given at 1021.

## 2012-04-05 NOTE — Interval H&P Note (Signed)
History and Physical Interval Note:  04/05/2012 9:09 AM  Kyle [name]  has presented today for surgery, with the diagnosis of Umbilical hernia  The various methods of treatment have been discussed with the patient and family. After consideration of risks, benefits and other options for treatment, the patient has consented to  Procedure(s) (LRB): HERNIA REPAIR UMBILICAL ADULT (N/A) as a surgical intervention .  The patients' history has been reviewed, patient examined, no change in status, stable for surgery.  I have reviewed the patients' chart and labs.  Questions were answered to the patient's satisfaction.     Zanya Lindo C

## 2012-04-05 NOTE — Transfer of Care (Signed)
Immediate Anesthesia Transfer of Care Note  Patient: Kyle [name]  Procedure(s) Performed: Procedure(s) (LRB): HERNIA REPAIR UMBILICAL ADULT (N/A)  Patient Location: PACU  Anesthesia Type: General  Level of Consciousness: awake  Airway & Oxygen Therapy: Patient Spontanous Breathing and non-rebreather face mask  Post-op Assessment: Report given to PACU RN, Post -op Vital signs reviewed and stable and Patient moving all extremities  Post vital signs: Reviewed and stable  Complications: No apparent anesthesia complications

## 2012-04-05 NOTE — Anesthesia Procedure Notes (Signed)
Procedure Name: Intubation Date/Time: 04/05/2012 11:15 AM Performed by: Franco Nones Pre-anesthesia Checklist: Patient identified, Patient being monitored, Timeout performed, Emergency Drugs available and Suction available Patient Re-evaluated:Patient Re-evaluated prior to inductionOxygen Delivery Method: Circle System Utilized Preoxygenation: Pre-oxygenation with 100% oxygen Intubation Type: IV induction, Rapid sequence and Cricoid Pressure applied Laryngoscope Size: Miller and 2 Grade View: Grade I Tube type: Oral Tube size: 7.0 mm Number of attempts: 1 Airway Equipment and Method: stylet Placement Confirmation: ETT inserted through vocal cords under direct vision,  positive ETCO2 and breath sounds checked- equal and bilateral Secured at: 21 cm Tube secured with: Tape Dental Injury: Teeth and Oropharynx as per pre-operative assessment

## 2012-04-06 LAB — PREPARE PLATELET PHERESIS: Unit division: 0

## 2012-04-07 ENCOUNTER — Encounter (HOSPITAL_COMMUNITY): Payer: Self-pay | Admitting: General Surgery

## 2012-04-09 NOTE — OR Nursing (Signed)
Left leg was postioned aligned with right leg, bovie was 2B instead of 3B, bed was 2A instead of 3A

## 2012-04-11 ENCOUNTER — Inpatient Hospital Stay (HOSPITAL_COMMUNITY)
Admission: EM | Admit: 2012-04-11 | Discharge: 2012-04-13 | DRG: 423 | Discharge status: Against Medical Advice | Payer: Medicaid Other | Attending: Internal Medicine | Admitting: Internal Medicine

## 2012-04-11 ENCOUNTER — Inpatient Hospital Stay (HOSPITAL_COMMUNITY): Payer: Medicaid Other

## 2012-04-11 ENCOUNTER — Encounter (HOSPITAL_COMMUNITY)
Admission: EM | Discharge status: Against Medical Advice | Payer: Self-pay | Source: Home / Self Care | Attending: Internal Medicine

## 2012-04-11 ENCOUNTER — Inpatient Hospital Stay (HOSPITAL_COMMUNITY): Payer: Medicaid Other | Admitting: Anesthesiology

## 2012-04-11 ENCOUNTER — Encounter (HOSPITAL_COMMUNITY): Payer: Self-pay

## 2012-04-11 ENCOUNTER — Emergency Department (HOSPITAL_COMMUNITY): Payer: Medicaid Other

## 2012-04-11 ENCOUNTER — Encounter (HOSPITAL_COMMUNITY): Payer: Self-pay | Admitting: Anesthesiology

## 2012-04-11 DIAGNOSIS — D62 Acute posthemorrhagic anemia: Secondary | ICD-10-CM | POA: Diagnosis present

## 2012-04-11 DIAGNOSIS — K922 Gastrointestinal hemorrhage, unspecified: Secondary | ICD-10-CM

## 2012-04-11 DIAGNOSIS — R109 Unspecified abdominal pain: Secondary | ICD-10-CM

## 2012-04-11 DIAGNOSIS — I8510 Secondary esophageal varices without bleeding: Secondary | ICD-10-CM

## 2012-04-11 DIAGNOSIS — K2901 Acute gastritis with bleeding: Secondary | ICD-10-CM

## 2012-04-11 DIAGNOSIS — K2211 Ulcer of esophagus with bleeding: Secondary | ICD-10-CM | POA: Diagnosis present

## 2012-04-11 DIAGNOSIS — J9600 Acute respiratory failure, unspecified whether with hypoxia or hypercapnia: Secondary | ICD-10-CM

## 2012-04-11 DIAGNOSIS — D731 Hypersplenism: Secondary | ICD-10-CM | POA: Diagnosis present

## 2012-04-11 DIAGNOSIS — K7460 Unspecified cirrhosis of liver: Secondary | ICD-10-CM | POA: Diagnosis present

## 2012-04-11 DIAGNOSIS — I959 Hypotension, unspecified: Secondary | ICD-10-CM

## 2012-04-11 DIAGNOSIS — I8500 Esophageal varices without bleeding: Secondary | ICD-10-CM

## 2012-04-11 DIAGNOSIS — R635 Abnormal weight gain: Secondary | ICD-10-CM

## 2012-04-11 DIAGNOSIS — D696 Thrombocytopenia, unspecified: Secondary | ICD-10-CM | POA: Diagnosis present

## 2012-04-11 DIAGNOSIS — D6959 Other secondary thrombocytopenia: Secondary | ICD-10-CM | POA: Diagnosis present

## 2012-04-11 DIAGNOSIS — Z886 Allergy status to analgesic agent status: Secondary | ICD-10-CM

## 2012-04-11 DIAGNOSIS — F329 Major depressive disorder, single episode, unspecified: Secondary | ICD-10-CM | POA: Diagnosis present

## 2012-04-11 DIAGNOSIS — D649 Anemia, unspecified: Secondary | ICD-10-CM

## 2012-04-11 DIAGNOSIS — K219 Gastro-esophageal reflux disease without esophagitis: Secondary | ICD-10-CM | POA: Diagnosis present

## 2012-04-11 DIAGNOSIS — I10 Essential (primary) hypertension: Secondary | ICD-10-CM | POA: Diagnosis present

## 2012-04-11 DIAGNOSIS — D509 Iron deficiency anemia, unspecified: Secondary | ICD-10-CM | POA: Diagnosis present

## 2012-04-11 DIAGNOSIS — G4733 Obstructive sleep apnea (adult) (pediatric): Secondary | ICD-10-CM | POA: Diagnosis present

## 2012-04-11 DIAGNOSIS — Z794 Long term (current) use of insulin: Secondary | ICD-10-CM

## 2012-04-11 DIAGNOSIS — R578 Other shock: Secondary | ICD-10-CM | POA: Diagnosis present

## 2012-04-11 DIAGNOSIS — K766 Portal hypertension: Secondary | ICD-10-CM

## 2012-04-11 DIAGNOSIS — Z79899 Other long term (current) drug therapy: Secondary | ICD-10-CM

## 2012-04-11 DIAGNOSIS — G4730 Sleep apnea, unspecified: Secondary | ICD-10-CM | POA: Diagnosis present

## 2012-04-11 DIAGNOSIS — E876 Hypokalemia: Secondary | ICD-10-CM | POA: Diagnosis present

## 2012-04-11 DIAGNOSIS — E118 Type 2 diabetes mellitus with unspecified complications: Secondary | ICD-10-CM | POA: Diagnosis present

## 2012-04-11 DIAGNOSIS — I8501 Esophageal varices with bleeding: Secondary | ICD-10-CM | POA: Diagnosis present

## 2012-04-11 DIAGNOSIS — F3289 Other specified depressive episodes: Secondary | ICD-10-CM | POA: Diagnosis present

## 2012-04-11 DIAGNOSIS — Z8614 Personal history of Methicillin resistant Staphylococcus aureus infection: Secondary | ICD-10-CM

## 2012-04-11 DIAGNOSIS — E669 Obesity, unspecified: Secondary | ICD-10-CM | POA: Diagnosis present

## 2012-04-11 DIAGNOSIS — K7689 Other specified diseases of liver: Principal | ICD-10-CM | POA: Diagnosis present

## 2012-04-11 HISTORY — DX: Ulcer of esophagus with bleeding: K22.11

## 2012-04-11 HISTORY — PX: ESOPHAGOGASTRODUODENOSCOPY: SHX5428

## 2012-04-11 HISTORY — PX: ESOPHAGOGASTRODUODENOSCOPY: SHX1529

## 2012-04-11 LAB — COMPREHENSIVE METABOLIC PANEL
ALT: 30 U/L (ref 0–53)
AST: 39 U/L — ABNORMAL HIGH (ref 0–37)
AST: 39 U/L — ABNORMAL HIGH (ref 0–37)
Albumin: 2.4 g/dL — ABNORMAL LOW (ref 3.5–5.2)
Albumin: 2.4 g/dL — ABNORMAL LOW (ref 3.5–5.2)
BUN: 14 mg/dL (ref 6–23)
BUN: 15 mg/dL (ref 6–23)
BUN: 16 mg/dL (ref 6–23)
CO2: 25 mEq/L (ref 19–32)
Calcium: 9.9 mg/dL (ref 8.4–10.5)
Chloride: 109 mEq/L (ref 96–112)
Chloride: 112 mEq/L (ref 96–112)
Creatinine, Ser: 0.82 mg/dL (ref 0.50–1.35)
Creatinine, Ser: 0.88 mg/dL (ref 0.50–1.35)
Creatinine, Ser: 1.01 mg/dL (ref 0.50–1.35)
GFR calc Af Amer: >90 mL/min (ref >90)
GFR calc non Af Amer: 82 mL/min — ABNORMAL LOW (ref >90)
Glucose, Bld: 132 mg/dL — ABNORMAL HIGH (ref 70–99)
Total Bilirubin: 1.4 mg/dL — ABNORMAL HIGH (ref 0.3–1.2)
Total Bilirubin: 1.3 mg/dL — ABNORMAL HIGH (ref 0.3–1.2)
Total Protein: 5.5 g/dL — ABNORMAL LOW (ref 6.0–8.3)
Total Protein: 5.6 g/dL — ABNORMAL LOW (ref 6.0–8.3)
Total Protein: 7.2 g/dL (ref 6.0–8.3)

## 2012-04-11 LAB — CBC
HCT: 28.8 % — ABNORMAL LOW (ref 39.0–52.0)
HCT: 31.9 % — ABNORMAL LOW (ref 39.0–52.0)
HCT: 37 % — ABNORMAL LOW (ref 39.0–52.0)
Hemoglobin: 9.9 g/dL — ABNORMAL LOW (ref 13.0–17.0)
Hemoglobin: 10.7 g/dL — ABNORMAL LOW (ref 13.0–17.0)
MCH: 30.5 pg (ref 26.0–34.0)
MCHC: 33.3 g/dL (ref 30.0–36.0)
MCV: 90.5 fL (ref 78.0–100.0)
MCV: 91.1 fL (ref 78.0–100.0)
MCV: 93.9 fL (ref 78.0–100.0)
Platelets: 142 10*3/uL — ABNORMAL LOW (ref 150–400)
Platelets: 69 10*3/uL — ABNORMAL LOW (ref 150–400)
Platelets: 77 10*3/uL — ABNORMAL LOW (ref 150–400)
RBC: 3.25 MIL/uL — ABNORMAL LOW (ref 4.22–5.81)
RBC: 3.59 MIL/uL — ABNORMAL LOW (ref 4.22–5.81)
RBC: 3.94 MIL/uL — ABNORMAL LOW (ref 4.22–5.81)
RDW: 16.5 % — ABNORMAL HIGH (ref 11.5–15.5)
WBC: 3.8 10*3/uL — ABNORMAL LOW (ref 4.0–10.5)
WBC: 6.1 10*3/uL (ref 4.0–10.5)
WBC: 8.5 10*3/uL (ref 4.0–10.5)
WBC: 9.1 10*3/uL (ref 4.0–10.5)

## 2012-04-11 LAB — BLOOD GAS, ARTERIAL
Acid-base deficit: 2.3 mmol/L — ABNORMAL HIGH (ref 0.0–2.0)
FIO2: 100 %
MECHVT: 600 mL
O2 Saturation: 99.5 %
Patient temperature: 37
TCO2: 21.5 mmol/L (ref 0–100)

## 2012-04-11 LAB — POCT I-STAT, CHEM 8
Calcium, Ion: 1.2 mmol/L (ref 1.12–1.32)
Hemoglobin: 10.2 g/dL — ABNORMAL LOW (ref 13.0–17.0)
Sodium: 144 mEq/L (ref 135–145)
TCO2: 24 mmol/L (ref 0–100)

## 2012-04-11 LAB — DIFFERENTIAL
Basophils Absolute: 0 10*3/uL (ref 0.0–0.1)
Eosinophils Absolute: 0.1 10*3/uL (ref 0.0–0.7)
Eosinophils Relative: 1 % (ref 0–5)
Lymphocytes Relative: 28 % (ref 12–46)
Lymphocytes Relative: 33 % (ref 12–46)
Lymphs Abs: 1.9 10*3/uL (ref 0.7–4.0)
Lymphs Abs: 2.5 10*3/uL (ref 0.7–4.0)
Monocytes Absolute: 0.7 10*3/uL (ref 0.1–1.0)
Monocytes Relative: 7 % (ref 3–12)
Neutro Abs: 3.4 10*3/uL (ref 1.7–7.7)

## 2012-04-11 LAB — PROTIME-INR
INR: 1.25 (ref 0.00–1.49)
INR: 1.45 (ref 0.00–1.49)
Prothrombin Time: 16 seconds — ABNORMAL HIGH (ref 11.6–15.2)
Prothrombin Time: 17.9 seconds — ABNORMAL HIGH (ref 11.6–15.2)

## 2012-04-11 LAB — LACTIC ACID, PLASMA
Lactic Acid, Venous: 4.3 mmol/L — ABNORMAL HIGH (ref 0.5–2.2)
Lactic Acid, Venous: 1.5 mmol/L (ref 0.5–2.2)

## 2012-04-11 LAB — URINALYSIS, ROUTINE W REFLEX MICROSCOPIC
Hgb urine dipstick: NEGATIVE
Ketones, ur: NEGATIVE mg/dL
Leukocytes, UA: NEGATIVE
Protein, ur: NEGATIVE mg/dL
Urobilinogen, UA: 0.2 mg/dL (ref 0.0–1.0)

## 2012-04-11 LAB — APTT
aPTT: 30 seconds (ref 24–37)
aPTT: 32 seconds (ref 24–37)

## 2012-04-11 LAB — MAGNESIUM: Magnesium: 1.2 mg/dL — ABNORMAL LOW (ref 1.5–2.5)

## 2012-04-11 LAB — PROCALCITONIN: Procalcitonin: <0.1 ng/mL

## 2012-04-11 LAB — GLUCOSE, CAPILLARY: Glucose-Capillary: 82 mg/dL (ref 70–99)

## 2012-04-11 LAB — PHOSPHORUS: Phosphorus: 3.6 mg/dL (ref 2.3–4.6)

## 2012-04-11 LAB — PREPARE RBC (CROSSMATCH)

## 2012-04-11 SURGERY — EGD (ESOPHAGOGASTRODUODENOSCOPY)
Anesthesia: Moderate Sedation

## 2012-04-11 SURGERY — ESOPHAGOGASTRODUODENOSCOPY (EGD) WITH PROPOFOL
Anesthesia: Monitor Anesthesia Care

## 2012-04-11 MED ORDER — SUCCINYLCHOLINE CHLORIDE 20 MG/ML IJ SOLN
INTRAMUSCULAR | Status: AC
  Filled 2012-04-11: qty 1

## 2012-04-11 MED ORDER — FENTANYL CITRATE 0.05 MG/ML IJ SOLN
100.0000 ug | Freq: Once | INTRAMUSCULAR | Status: AC
  Administered 2012-04-11: 100 ug via INTRAVENOUS

## 2012-04-11 MED ORDER — PROPOFOL 10 MG/ML IV EMUL
5.0000 ug/kg/min | INTRAVENOUS | Status: DC
  Administered 2012-04-11: 5 ug/kg/min via INTRAVENOUS

## 2012-04-11 MED ORDER — OCTREOTIDE LOAD VIA INFUSION
100.0000 ug | Freq: Once | INTRAVENOUS | Status: AC
  Administered 2012-04-11: 100 ug via INTRAVENOUS
  Filled 2012-04-11: qty 50

## 2012-04-11 MED ORDER — LIDOCAINE HCL (CARDIAC) 10 MG/ML IV SOLN
INTRAVENOUS | Status: DC | PRN
  Administered 2012-04-11: 10 mg via INTRAVENOUS

## 2012-04-11 MED ORDER — STERILE WATER FOR IRRIGATION IR SOLN
Status: DC | PRN
  Administered 2012-04-11: 1000 mL

## 2012-04-11 MED ORDER — PANTOPRAZOLE SODIUM 40 MG IV SOLR
INTRAVENOUS | Status: AC
  Filled 2012-04-11: qty 80

## 2012-04-11 MED ORDER — MIDAZOLAM HCL 2 MG/2ML IJ SOLN
INTRAMUSCULAR | Status: AC
  Filled 2012-04-11: qty 2

## 2012-04-11 MED ORDER — POTASSIUM CHLORIDE IN NACL 20-0.9 MEQ/L-% IV SOLN
INTRAVENOUS | Status: DC
  Administered 2012-04-11: 150 mL/h via INTRAVENOUS
  Administered 2012-04-11: 03:00:00 via INTRAVENOUS
  Filled 2012-04-11 (×3): qty 1000

## 2012-04-11 MED ORDER — SODIUM CHLORIDE 0.9 % IV SOLN
1.0000 mg/h | Freq: Once | INTRAVENOUS | Status: DC
  Filled 2012-04-11: qty 10

## 2012-04-11 MED ORDER — MIDAZOLAM HCL 2 MG/2ML IJ SOLN: 4.0000 mg | Freq: Once | INTRAMUSCULAR | Status: DC

## 2012-04-11 MED ORDER — SODIUM CHLORIDE 0.9 % IV BOLUS (SEPSIS)
1000.0000 mL | Freq: Once | INTRAVENOUS | Status: AC
  Administered 2012-04-11: 1000 mL via INTRAVENOUS

## 2012-04-11 MED ORDER — BIOTENE DRY MOUTH MT LIQD
15.0000 mL | Freq: Four times a day (QID) | OROMUCOSAL | Status: DC
  Administered 2012-04-12 – 2012-04-13 (×7): 15 mL via OROMUCOSAL

## 2012-04-11 MED ORDER — ROCURONIUM BROMIDE 100 MG/10ML IV SOLN
INTRAVENOUS | Status: DC | PRN
  Administered 2012-04-11: 30 mg via INTRAVENOUS
  Administered 2012-04-11: 20 mg via INTRAVENOUS

## 2012-04-11 MED ORDER — PROPOFOL 10 MG/ML IV EMUL
INTRAVENOUS | Status: DC | PRN
  Administered 2012-04-11: 11:00:00 via INTRAVENOUS
  Administered 2012-04-11: 150 ug/kg/min via INTRAVENOUS

## 2012-04-11 MED ORDER — SODIUM CHLORIDE 0.9 % IV SOLN
80.0000 mg | INTRAVENOUS | Status: AC
  Administered 2012-04-11: 80 mg via INTRAVENOUS
  Filled 2012-04-11: qty 80

## 2012-04-11 MED ORDER — FENTANYL CITRATE 0.05 MG/ML IJ SOLN
INTRAMUSCULAR | Status: DC | PRN
  Administered 2012-04-11 (×4): 25 ug via INTRAVENOUS

## 2012-04-11 MED ORDER — PROPOFOL 10 MG/ML IV EMUL
INTRAVENOUS | Status: DC | PRN
  Administered 2012-04-11: 20 mg via INTRAVENOUS

## 2012-04-11 MED ORDER — LIDOCAINE HCL (PF) 1 % IJ SOLN
INTRAMUSCULAR | Status: AC
  Filled 2012-04-11: qty 5

## 2012-04-11 MED ORDER — SODIUM CHLORIDE 0.9 % IV SOLN
50.0000 ug/h | INTRAVENOUS | Status: DC
  Administered 2012-04-11 (×2): 50 ug/h via INTRAVENOUS
  Filled 2012-04-11: qty 50

## 2012-04-11 MED ORDER — FENTANYL CITRATE 0.05 MG/ML IJ SOLN
INTRAMUSCULAR | Status: AC
  Filled 2012-04-11: qty 2

## 2012-04-11 MED ORDER — FENTANYL BOLUS VIA INFUSION
50.0000 ug | Freq: Four times a day (QID) | INTRAVENOUS | Status: DC | PRN
  Filled 2012-04-11: qty 100

## 2012-04-11 MED ORDER — SODIUM CHLORIDE 0.9 % IV SOLN
8.0000 mg/h | INTRAVENOUS | Status: DC
  Administered 2012-04-11 – 2012-04-12 (×3): 8 mg/h via INTRAVENOUS
  Filled 2012-04-11 (×8): qty 80

## 2012-04-11 MED ORDER — STERILE WATER FOR IRRIGATION IR SOLN
Status: DC | PRN
  Administered 2012-04-11: 11:00:00

## 2012-04-11 MED ORDER — ONDANSETRON HCL 4 MG/2ML IJ SOLN: 4.0000 mg | Freq: Three times a day (TID) | INTRAMUSCULAR | Status: AC | PRN

## 2012-04-11 MED ORDER — SODIUM CHLORIDE 0.9 % IV SOLN
50.0000 ug/h | INTRAVENOUS | Status: DC
  Administered 2012-04-11 (×2): 25 ug/h via INTRAVENOUS
  Administered 2012-04-12: 50 ug/h via INTRAVENOUS
  Filled 2012-04-11 (×12): qty 1

## 2012-04-11 MED ORDER — OCTREOTIDE ACETATE 500 MCG/ML IJ SOLN
INTRAMUSCULAR | Status: AC
  Filled 2012-04-11: qty 1

## 2012-04-11 MED ORDER — SODIUM CHLORIDE 0.9 % IV SOLN: INTRAVENOUS | Status: DC

## 2012-04-11 MED ORDER — HYDROMORPHONE HCL PF 1 MG/ML IJ SOLN: 0.5000 mg | INTRAMUSCULAR | Status: DC | PRN

## 2012-04-11 MED ORDER — SODIUM CHLORIDE 0.9 % IV SOLN
INTRAVENOUS | Status: DC | PRN
  Administered 2012-04-11 (×2): via INTRAVENOUS

## 2012-04-11 MED ORDER — SODIUM CHLORIDE 0.9 % IV SOLN
10.0000 ug/h | Freq: Once | INTRAVENOUS | Status: AC
  Administered 2012-04-11: 10 ug/h via INTRAVENOUS
  Filled 2012-04-11: qty 50

## 2012-04-11 MED ORDER — NOREPINEPHRINE BITARTRATE 1 MG/ML IJ SOLN
2.0000 ug/min | Freq: Once | INTRAVENOUS | Status: AC
  Administered 2012-04-11: 10 ug/min via INTRAVENOUS
  Filled 2012-04-11: qty 4

## 2012-04-11 MED ORDER — ROCURONIUM BROMIDE 50 MG/5ML IV SOLN
INTRAVENOUS | Status: AC
  Filled 2012-04-11: qty 2

## 2012-04-11 MED ORDER — PROPOFOL 10 MG/ML IV EMUL
INTRAVENOUS | Status: AC
  Filled 2012-04-11: qty 20

## 2012-04-11 MED ORDER — MIDAZOLAM HCL 2 MG/2ML IJ SOLN
INTRAMUSCULAR | Status: AC
  Filled 2012-04-11: qty 4

## 2012-04-11 MED ORDER — DEXTROSE 5 % IV SOLN
1.0000 g | INTRAVENOUS | Status: AC
  Administered 2012-04-11 – 2012-04-13 (×3): 1 g via INTRAVENOUS
  Filled 2012-04-11 (×4): qty 10

## 2012-04-11 MED ORDER — SUCCINYLCHOLINE CHLORIDE 20 MG/ML IJ SOLN
INTRAMUSCULAR | Status: DC | PRN
  Administered 2012-04-11: 100 mg via INTRAVENOUS

## 2012-04-11 MED ORDER — PROPOFOL 10 MG/ML IV EMUL
INTRAVENOUS | Status: AC
  Filled 2012-04-11: qty 100

## 2012-04-11 MED ORDER — CHLORHEXIDINE GLUCONATE 0.12 % MT SOLN
15.0000 mL | Freq: Two times a day (BID) | OROMUCOSAL | Status: DC
  Administered 2012-04-11 – 2012-04-13 (×4): 15 mL via OROMUCOSAL
  Filled 2012-04-11 (×4): qty 15

## 2012-04-11 MED ORDER — ROCURONIUM BROMIDE 50 MG/5ML IV SOLN
INTRAVENOUS | Status: AC
  Filled 2012-04-11: qty 1

## 2012-04-11 MED ORDER — LIDOCAINE HCL (CARDIAC) 20 MG/ML IV SOLN
INTRAVENOUS | Status: AC
  Filled 2012-04-11: qty 5

## 2012-04-11 MED ORDER — SODIUM CHLORIDE 0.9 % IV SOLN: 250.0000 mL | INTRAVENOUS | Status: DC | PRN

## 2012-04-11 MED ORDER — MIDAZOLAM HCL 5 MG/5ML IJ SOLN
INTRAMUSCULAR | Status: DC | PRN
  Administered 2012-04-11: 0.5 mg via INTRAVENOUS
  Administered 2012-04-11: 1 mg via INTRAVENOUS
  Administered 2012-04-11: 0.5 mg via INTRAVENOUS

## 2012-04-11 MED ORDER — SODIUM CHLORIDE 0.9 % IJ SOLN
INTRAMUSCULAR | Status: AC
  Filled 2012-04-11: qty 3

## 2012-04-11 MED ORDER — SODIUM CHLORIDE 0.9 % IV SOLN
250.0000 mg | Freq: Once | INTRAVENOUS | Status: AC
  Administered 2012-04-11: 250 mg via INTRAVENOUS
  Filled 2012-04-11: qty 250

## 2012-04-11 MED ORDER — PROPOFOL 10 MG/ML IV EMUL
5.0000 ug/kg/min | INTRAVENOUS | Status: DC
  Administered 2012-04-11: 25 ug/kg/min via INTRAVENOUS

## 2012-04-11 MED ORDER — MIDAZOLAM BOLUS VIA INFUSION
1.0000 mg | INTRAVENOUS | Status: DC | PRN
  Filled 2012-04-11: qty 2

## 2012-04-11 MED ORDER — ONDANSETRON HCL 4 MG/2ML IJ SOLN: 4.0000 mg | INTRAMUSCULAR | Status: DC | PRN

## 2012-04-11 MED ORDER — SODIUM CHLORIDE 0.9 % IV SOLN
25.0000 ug/h | INTRAVENOUS | Status: DC
  Filled 2012-04-11 (×4): qty 1

## 2012-04-11 MED ORDER — INSULIN ASPART 100 UNIT/ML ~~LOC~~ SOLN
0.0000 [IU] | SUBCUTANEOUS | Status: DC
  Administered 2012-04-11 – 2012-04-12 (×2): 1 [IU] via SUBCUTANEOUS
  Administered 2012-04-13: 2 [IU] via SUBCUTANEOUS
  Administered 2012-04-13: 1 [IU] via SUBCUTANEOUS

## 2012-04-11 MED ORDER — NOREPINEPHRINE BITARTRATE 1 MG/ML IJ SOLN
2.0000 ug/min | INTRAMUSCULAR | Status: DC
  Filled 2012-04-11: qty 4

## 2012-04-11 MED ORDER — SODIUM CHLORIDE 0.9 % IV SOLN
2.0000 mg/h | INTRAVENOUS | Status: DC
  Filled 2012-04-11: qty 10

## 2012-04-11 MED ORDER — ETOMIDATE 2 MG/ML IV SOLN
INTRAVENOUS | Status: AC
  Filled 2012-04-11: qty 20

## 2012-04-11 SURGICAL SUPPLY — 20 items
BAND LIGATOR SUPER 7 2.8 (MISCELLANEOUS) ×2 IMPLANT
BLOCK BITE 60FR ADLT L/F BLUE (MISCELLANEOUS) ×2 IMPLANT
DEVICE CLIP HEMOSTAT 235CM (CLIP) ×2 IMPLANT
ELECT REM PT RETURN 9FT ADLT (ELECTROSURGICAL)
ELECTRODE REM PT RTRN 9FT ADLT (ELECTROSURGICAL) IMPLANT
FLOOR PAD 36X40 (MISCELLANEOUS) ×2
FORCEP COLD BIOPSY (CUTTING FORCEPS) IMPLANT
FORCEP RJ3 GP 1.8X160 W-NEEDLE (CUTTING FORCEPS) IMPLANT
FORCEPS BIOP RAD 4 LRG CAP 4 (CUTTING FORCEPS) IMPLANT
MANIFOLD NEPTUNE WASTE (CANNULA) ×2 IMPLANT
NEEDLE SCLEROTHERAPY 25GX240 (NEEDLE) IMPLANT
PAD FLOOR 36X40 (MISCELLANEOUS) ×1 IMPLANT
PROBE APC STR FIRE (PROBE) IMPLANT
PROBE INJECTION GOLD (MISCELLANEOUS)
PROBE INJECTION GOLD 7FR (MISCELLANEOUS) IMPLANT
SNARE ROTATE MED OVAL 20MM (MISCELLANEOUS) IMPLANT
SYR 50ML LL SCALE MARK (SYRINGE) ×2 IMPLANT
TUBING ENDO SMARTCAP PENTAX (MISCELLANEOUS) ×2 IMPLANT
TUBING IRRIGATION ENDOGATOR (MISCELLANEOUS) ×2 IMPLANT
WATER STERILE IRR 1000ML POUR (IV SOLUTION) ×4 IMPLANT

## 2012-04-11 NOTE — Brief Op Note (Signed)
04/11/2012  11:32 AM  PATIENT:  [name]  56 y.o. male  PRE-OPERATIVE DIAGNOSIS:  UGI Bleed in a patient with h/o variceal bleed  POST-OPERATIVE DIAGNOSIS:  UGI Bleed in a patient with h/o variceal bleed, esophageal ulcer, bleeding could not be controlled with one clip  PROCEDURE:  Procedure(s) (LRB): ESOPHAGOGASTRODUODENOSCOPY (EGD) WITH PROPOFOL (N/A)  SURGEON:  Surgeon(s) and Role:    * Malissa Hippo, MD - Primary  EGD findings; Grade 2 columns of esophageal varices. Single ulcer at distal esophagus with fresh clot. Portal gastropathy. Application of Hemoclip not successful and resulted in active bleeding. Single band applied distal to this ulcer but was not successful. Patient intubated and airway protection. Recommendations; Patient will need TIPS. Ceftriaxone 1 g every 24 hours.

## 2012-04-11 NOTE — H&P (Signed)
PCP:   Redmond Baseman, MD, MD   Gastroenterologist:  Dr. Cindie Laroche  Surgeon: Dr. Tilford Pillar  Chief Complaint:  Vomiting blood since this evening  HPI: Kyle [name] is an 56 y.o. male.  Known history of liver cirrhosis with portal hypertension and esophageal varices. Presents with sudden onset of vomiting of bright red blood. A right to the emergency room within 45 minutes of onset, and by his wife's estimate to order vomited about a pint of blood, was sweaty and cold and was vomiting blood on arrival in the emergency room and was found to the hypotensive with a systolic blood pressure in the 80s. IV fluids were given labs drawn and on-call gastroenterologist Dr. Karilyn Cota contacted to see the patient in the intensive care. Hospitalist service was called to assist with management. Patient has already been started on a Protoni drip, octreotide, and is receiving IV fluid resuscitation  At the time of interview although patient says he's feeling better, he is looking ill and vomited about another 1 pint of blood in my presence. The nurse reports that he vomited about a liter of blood in the emergency room prior to my arrival.  Patient initially denied using any specific NSAIDs, but after his medication list was reviewed he admitted to taking Aleve last use 3 weeks ago. He is also currently taking ibuprofen in combination with hydrocodone, since his umbilical hernia surgery on May 13. He also today completed a course of doxycycline for recent staph infection He chronically takes metformin for his diabetes.   Rewiew of Systems:  Because of his acute illness review of systems is limited, his wife reports that he was at a good baseline level, without any complaints, prior to the onset of the vomiting. He gets a good meal at about 6 PM.   Past Medical History  Diagnosis Date  . DM (diabetes mellitus)   . Cirrhosis     bx proven steatohepatitis with cirrhosis (2010); per note from Feb  2011, received Hep A and B vaccines in 2010  . HTN (hypertension)   . RLS (restless legs syndrome)   . Sleep apnea   . Hyperlipidemia   . IDA (iron deficiency anemia)   . GERD (gastroesophageal reflux disease)   . Depression   . Peripheral neuropathy   . Urothelial cancer     2010, paillary low-grade, h/o recurrence 2011  . B12 deficiency   . Psoriasis   . 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 Anemia due to multiple mechanisms 05/13/2011  . History of alcohol abuse 05/13/2011  . ANEMIA-IRON DEFICIENCY 03/09/2009  . S/P endoscopy May 2012    4 columns grade II esophageal varices; due for repeat in Nov 2013   . S/P colonoscopy May 2012    Tubular adenoma  . Low back pain   . Cancer     bladder ca 05/2009 removal and  w/chemo wash  . Esophageal varices with bleeding 11/24/11    s/p emergent EGD 11/25/11 by Dr. Rhea Belton at [facility], Grade III esophageal varices s/p banding X 5  . Small bowel obstruction 02/15/2012    Admitted to [facility], managed by Dr. Leticia Penna, ventral hernia manually reduced  . Ventral hernia 02/15/12  . MRSA (methicillin resistant Staphylococcus aureus)     Past Surgical History  Procedure Date  . Carpel tunnel   . Shoulder surgery   . Adrenal mass surgery 05/2009    benign, left  . Bladder surgery 01/2009 and 06/2010    cancer  2010, small recurrence in 06/2010  . Colonoscopy 04/2009    moderate int hemorrhoids, rare sigmoid diverticula, one mm sessile hyperplastic rectal polyp  . Esophagogastroduodenoscopy 03/2009    small hh  . Small bowel capsule endoscopy 03/2009    couple of small benign appearing erosions, nonbleeding  . Egd/tcs 08/2007    small hiatal hernia, pancolonic diverticula, friable anal canal, 3cm salmon colored epithelium in distal esophagus, bx negative for Barrett's  . Skin cancer excision Oct 2012    left arm  . Esophagogastroduodenoscopy 11/25/2011    Procedure: ESOPHAGOGASTRODUODENOSCOPY (EGD);  Surgeon: Erick Blinks, MD;  Location: [facility]  ENDOSCOPY;  Service: Gastroenterology;  Laterality: Left;  . Esophagogastroduodenoscopy 01/14/2012    Dr Rourk->4-5 columns Gr2 varices, 6 bands placed, HH, distal esophageal ulcer, portal gastropathy, antral erosions  . Esophagogastroduodenoscopy 03/17/2012    Procedure: ESOPHAGOGASTRODUODENOSCOPY (EGD);  Surgeon: Corbin Ade, MD;  Location: [facility] ENDO SUITE;  Service: Endoscopy;  Laterality: N/A;  8:30  . Umbilical hernia repair 04/05/2012    Procedure: HERNIA REPAIR UMBILICAL ADULT;  Surgeon: Fabio Bering, MD;  Location: [facility] ORS;  Service: General;  Laterality: N/A;    Medications:  HOME MEDS: Prior to Admission medications   Medication Sig Start Date End Date Taking? Authorizing Provider  doxycycline (VIBRAMYCIN) 100 MG capsule Take 1 capsule (100 mg total) by mouth 2 (two) times daily. For 10 days 03/31/12 04/10/12  Tammy L. Triplett, PA  fish oil-omega-3 fatty acids 1000 MG capsule Take 1 g by mouth 3 (three) times daily.     Historical Provider, MD  furosemide (LASIX) 20 MG tablet Take 20 mg by mouth daily.     Historical Provider, MD  gabapentin (NEURONTIN) 300 MG capsule Take 600-900 mg by mouth 2 (two) times daily. Patient takes 600mg  at about 1700 hours and then takes 900mg  at bedtime    Historical Provider, MD  hydrocodone-ibuprofen (VICOPROFEN) 5-200 MG per tablet Take 1-2 tablets by mouth every 4 (four) hours as needed for pain. 04/05/12 04/15/12  Fabio Bering, MD  insulin aspart (NOVOLOG) 100 UNIT/ML injection Inject 25-35 Units into the skin 2 (two) times daily. Sliding Scale Checks blood sugar 3 to 4 times daily    Historical Provider, MD  insulin glargine (LANTUS) 100 UNIT/ML injection Inject 60 Units into the skin 2 (two) times daily. Before breakfast & Dinner    Historical Provider, MD  lisinopril (PRINIVIL,ZESTRIL) 20 MG tablet Take 10 mg by mouth daily.     Historical Provider, MD  magnesium oxide (MAG-OX) 400 MG tablet Take 400 mg by mouth daily.    Historical Provider, MD    metFORMIN (GLUCOPHAGE) 1000 MG tablet Take 1,000 mg by mouth 2 (two) times daily with a meal.      Historical Provider, MD  Multiple Vitamin (MULTIVITAMIN) capsule Take 1 capsule by mouth daily.      Historical Provider, MD  mupirocin ointment (BACTROBAN) 2 % Apply 1 application topically 2 (two) times daily.    Historical Provider, MD  naproxen sodium (ALEVE) 220 MG tablet Take 220 mg by mouth 2 (two) times daily as needed. Pain    Historical Provider, MD  omeprazole (PRILOSEC) 40 MG capsule Take 40 mg by mouth daily.    Historical Provider, MD  pramipexole (MIRAPEX) 0.5 MG tablet Take 0.5 mg by mouth at bedtime.     Historical Provider, MD  propranolol (INDERAL) 40 MG tablet Take 40 mg by mouth 2 (two) times daily.    Historical Provider, MD  simvastatin (ZOCOR) 20 MG tablet Take 20 mg by mouth every evening.    Historical Provider, MD  traZODone (DESYREL) 50 MG tablet Take 50 mg by mouth at bedtime.    Historical Provider, MD     Allergies:  Allergies  Allergen Reactions  . Tylenol (Acetaminophen) Other (See Comments)    Causes legs to "run"    Social History:   reports that he has been smoking Cigarettes.  He has a 45 pack-year smoking history. He does not have any smokeless tobacco history on file. He reports that he does not drink alcohol or use illicit drugs.  Family History: Family History  Problem Relation Age of Onset  . Cirrhosis Father     etoh  . Colon cancer Neg Hx   . Anesthesia problems Neg Hx   . Hypotension Neg Hx   . Malignant hyperthermia Neg Hx   . Pseudochol deficiency Neg Hx   . Kidney cancer Mother   . Cancer Mother   . HIV Brother   . Cirrhosis Brother     nash     Physical Exam: Filed Vitals:   04/11/12 0022 04/11/12 0050 04/11/12 0108 04/11/12 0115  BP:  88/54 107/77 102/64  Pulse:  99 96 92  Temp: 97.9 F (36.6 C)     TempSrc: Oral     Resp:  23 20 20   Height:      Weight:      SpO2:  96% 97% 93%   Blood pressure 102/64, pulse 92,  temperature 97.9 F (36.6 C), temperature source Oral, resp. rate 20, height 5\' 7"  (1.702 m), weight 104.327 kg (230 lb), SpO2 93.00%.  GEN:  Obese ill-looking anxious Caucasian gentleman lying in the stretcher ; cooperative with exam PSYCH:  alert and oriented x4;  affect is appropriate. HEENT: Mucous membranes pink and anicteric; PERRLA; EOM intact; thick neck no cervical lymphadenopathy nor thyromegaly or carotid bruit; no JVD; Breasts:: Not examined CHEST WALL: No tenderness CHEST: Normal respiration, clear to auscultation bilaterally HEART: Regular rate and rhythm; no murmurs rubs or gallops BACK: No kyphosis or scoliosis; no CVA tenderness ABDOMEN: Obese, epigastric tenderness; distended abdomen with flank dullness; periumbilical bruising related to his recent umbilical hernia surgery;, periumbilical dressings; ; no intertriginous candida. Rectal Exam: Not done EXTREMITIES: age-appropriate arthropathy of the hands and knees; no edema; no ulcerations. Genitalia: not examined PULSES: 2+ and symmetric SKIN: Normal hydration no rash or ulceration, other than noted above CNS: Cranial nerves 2-12 grossly intact no focal lateralizing neurologic deficit   Labs & Imaging Results for orders placed during the hospital encounter of 04/11/12 (from the past 48 hour(s))  CBC     Status: Abnormal   Collection Time   04/11/12 12:21 AM      Component Value Range Comment   WBC 9.1  4.0 - 10.5 (K/uL)    RBC 3.94 (*) 4.22 - 5.81 (MIL/uL)    Hemoglobin 12.0 (*) 13.0 - 17.0 (g/dL)    HCT 16.1 (*) 09.6 - 52.0 (%)    MCV 93.9  78.0 - 100.0 (fL)    MCH 30.5  26.0 - 34.0 (pg)    MCHC 32.4  30.0 - 36.0 (g/dL)    RDW 04.5 (*) 40.9 - 15.5 (%)    Platelets 142 (*) 150 - 400 (K/uL)   DIFFERENTIAL     Status: Normal   Collection Time   04/11/12 12:21 AM      Component Value Range Comment   Neutrophils Relative 63  43 - 77 (%)    Neutro Abs 5.7  1.7 - 7.7 (K/uL)    Lymphocytes Relative 28  12 - 46 (%)     Lymphs Abs 2.5  0.7 - 4.0 (K/uL)    Monocytes Relative 7  3 - 12 (%)    Monocytes Absolute 0.7  0.1 - 1.0 (K/uL)    Eosinophils Relative 1  0 - 5 (%)    Eosinophils Absolute 0.1  0.0 - 0.7 (K/uL)    Basophils Relative 0  0 - 1 (%)    Basophils Absolute 0.0  0.0 - 0.1 (K/uL)   COMPREHENSIVE METABOLIC PANEL     Status: Abnormal   Collection Time   04/11/12 12:21 AM      Component Value Range Comment   Sodium 139  135 - 145 (mEq/L)    Potassium 4.6  3.5 - 5.1 (mEq/L)    Chloride 102  96 - 112 (mEq/L)    CO2 25  19 - 32 (mEq/L)    Glucose, Bld 132 (*) 70 - 99 (mg/dL)    BUN 14  6 - 23 (mg/dL)    Creatinine, Ser 1.61  0.50 - 1.35 (mg/dL)    Calcium 9.9  8.4 - 10.5 (mg/dL)    Total Protein 7.2  6.0 - 8.3 (g/dL)    Albumin 2.9 (*) 3.5 - 5.2 (g/dL)    AST 44 (*) 0 - 37 (U/L)    ALT 30  0 - 53 (U/L)    Alkaline Phosphatase 83  39 - 117 (U/L)    Total Bilirubin 0.5  0.3 - 1.2 (mg/dL)    GFR calc non Af Amer 82 (*) >90 (mL/min)    GFR calc Af Amer >90  >90 (mL/min)   APTT     Status: Normal   Collection Time   04/11/12 12:21 AM      Component Value Range Comment   aPTT 32  24 - 37 (seconds)   PROTIME-INR     Status: Abnormal   Collection Time   04/11/12 12:21 AM      Component Value Range Comment   Prothrombin Time 16.0 (*) 11.6 - 15.2 (seconds)    INR 1.25  0.00 - 1.49    TYPE AND SCREEN     Status: Normal (Preliminary result)   Collection Time   04/11/12 12:24 AM      Component Value Range Comment   ABO/RH(D) O POS      Antibody Screen PENDING      Sample Expiration 04/14/2012     LACTIC ACID, PLASMA     Status: Abnormal   Collection Time   04/11/12 12:35 AM      Component Value Range Comment   Lactic Acid, Venous 4.3 (*) 0.5 - 2.2 (mmol/L)    No results found.    Assessment Present on Admission:   .Upper GI bleed .Esophageal varices Hypotension  .DIABETES MELLITUS, TYPE II, CONTROLLED, WITH COMPLICATIONS .SLEEP APNEA, OBSTRUCTIVE, MODERATE .HYPERTENSION .HEPATIC  CIRRHOSIS .Other chronic nonalcoholic liver disease  .Thrombocytopenia due to hypersplenism    PLAN: Because of the visibly rapid bleeding, will prepare a transfusion of 2 units of packed red cells, and keep 2 units en route reserved.  Admitting to that ICU for cardiovascular monitoring and treatment as necessary. Bear in mind that his maintenance dose of his likely blocking reflex tachycardia  Keep him n.p.o. in expectation of emergent endoscopy  Other plans as per orders, and per his hospital course over the next  few hours  Critical care time: 60 minutes.   Paschal Blanton 04/11/2012, 1:20 AM

## 2012-04-11 NOTE — Op Note (Signed)
[facility] [address] [place], [place]  [address]  ENDOSCOPY PROCEDURE REPORT  PATIENT:  [name], [name]  MR#:  [id number] BIRTHDATE:  [birth year], 56 yrs. old  GENDER:  male  ENDOSCOPIST:  Iva Boop, MD, [facility] Referred by:  PROCEDURE DATE:  04/11/2012 PROCEDURE:  EGD for control of bleeding, EGD with band ligation of varices ASA CLASS:  Class IV INDICATIONS:  hemorrhage of GI tract upper GI bleeding with post-ligation ulcer and varices and attempted therapy earler today   MEDICATIONS:   Versed IV, propofol (Diprivan) IV intubated in ICU TOPICAL ANESTHETIC:  none  DESCRIPTION OF PROCEDURE:   After the risks benefits and alternatives of the procedure were thoroughly explained, informed consent was obtained.  The  endoscope was introduced through the mouth and advanced to the second portion of the duodenum, without limitations.  The instrument was slowly withdrawn as the mucosa was fully examined. <<PROCEDUREIMAGES>>  An ulcer was found in the distal esophagus. Adherent clot which was removed showing smaller adherent clot and visible vessel. Bicap cautery used to treat the vessel/ulcer without problems. Grade I varices were found in the distal esophagus. One already ligated from earlier EGD today. He had other varices, 1 band placed but unable to suction enough mucosa to ligate others, had originally tried to ligate around the ulcer but that was unsuccessful so then treated the ulcer with BiCap as above. Portal Hypertensive gastropathy in the body of the stomach. Patchy changes.  Otherwise the examination was normal.    Retroflexed views revealed no abnormalities.    The scope was then withdrawn from the patient and the procedure completed.  COMPLICATIONS:  None  ENDOSCOPIC IMPRESSION: 1) Ulcer in the distal esophagus  adherent lot and visible vessel treated with BiCap 2) Grade I varices in the distal esophagus - one ligating band placed 3)  Portal Hypertensive gastropathy in the body of the stomach 4) Otherwise normal examination RECOMMENDATIONS: 1) Continuous PPI 2) Continuous ocrtreotide 3) May not need TIPS as all/most bleeding likely from the ulcer 4)  Carafate when taking po  Iva Boop, MD, [facility]  CC:  R. Roetta Sessions, MD  n. Rosalie Doctor:   Iva Boop at 04/11/2012 06:09 PM  Bethel Born, [id number]

## 2012-04-11 NOTE — Anesthesia Procedure Notes (Addendum)
Procedure Name: MAC Date/Time: 04/11/2012 10:38 AM Performed by: Franco Nones Pre-anesthesia Checklist: Patient identified, Emergency Drugs available, Suction available, Timeout performed and Patient being monitored Patient Re-evaluated:Patient Re-evaluated prior to inductionOxygen Delivery Method: Non-rebreather mask   Procedure Name: Intubation Date/Time: 04/11/2012 11:08 AM Performed by: Franco Nones Pre-anesthesia Checklist: Patient identified, Patient being monitored, Timeout performed, Emergency Drugs available and Suction available Patient Re-evaluated:Patient Re-evaluated prior to inductionOxygen Delivery Method: Circle System Utilized Preoxygenation: Pre-oxygenation with 100% oxygen Intubation Type: IV induction, Rapid sequence and Cricoid Pressure applied Laryngoscope Size: Miller and 2 Grade View: Grade I Tube type: Oral Tube size: 7.0 mm Number of attempts: 1 Airway Equipment and Method: stylet Placement Confirmation: ETT inserted through vocal cords under direct vision,  positive ETCO2 and breath sounds checked- equal and bilateral Secured at: 21 cm Tube secured with: Tape Dental Injury: Teeth and Oropharynx as per pre-operative assessment

## 2012-04-11 NOTE — ED Notes (Signed)
Pt vomited bright red blood per emesis bag since placed in room

## 2012-04-11 NOTE — Op Note (Signed)
Kyle [name], Kyle [name] NO.:  [id number]  MEDICAL RECORD NO.:  [id number]  LOCATION:  2105                         FACILITY:  [facility]  PHYSICIAN:  Lionel December, M.D.    DATE OF BIRTH:  26-Oct-1956  DATE OF PROCEDURE:  04/11/2012 DATE OF DISCHARGE:                              OPERATIVE REPORT   PROCEDURE:  Esophagogastroduodenoscopy.  INDICATION:  The patient is a 56 year old Caucasian male who presents with torrential upper GI bleed.  He has history of esophageal variceal bleed and was initially Band-Aid on November 25, 2011.  He has undergone 2 more elective banding sessions most recently on March 17, 2012, by Dr. Jena Gauss when 10 bands were applied to recurrent varices.  Procedure and risks were reviewed with the patient.  Informed consent was obtained. It was decided to do the procedure with help from anesthesia team.  MEDS FOR SEDATION/ANESTHESIA:  Please see anesthesia records for details.  Procedure begun with propofol, but the patient was intubated for airway protection midway.  FINDINGS:  Procedure performed in the OR.  The patient's vital signs and O2 sats were monitored during the procedure and remained stable.  The patient was placed in left lateral recumbent position and once he was sedated, Pentax video scope was passed through oropharynx without any difficulty into esophagus. Mucosa of the proximal segment was normal.  Distally, there were areas of scarring and at least 3 linear columns of grade 2 esophageal varices, 2 of which were located close to each other.  There was a single ulcer at distal esophagus next to a scar.  There was a small clot, but no active bleeding noted.  Serrated GE junction located at 40 cm from the incisors.  Stomach:  There were streaks of blood in the proximal stomach.  Stomach distended very well by insufflation.  Folds in the proximal stomach are normal.  Examination of the mucosa at body revealed mosaic pattern and some  red areas with no bleeding noted.  Antral mucosa was normal. Pyloric channel was patent.  Angularis, fundus, and cardia were examined by retroflexion scope.  No fundal varices were identified.  Duodenum:  Bulbar and postbulbar mucosa was normal.  Attention was then directed again to the esophageal ulcer.  I felt that this could not be rebanded because this is located next to a scar. Therefore, hemoclip was closed.  It appeared to be in good position. However, when it was fired, it fell often, active bleeding noted. Endoscope was withdrawn and banding device loaded onto it and endoscope was passed again.  I was not able to band the bleeding site because I could not pull the tissue into the channel.  Band was applied distal to it, but it was not effective.  At this point, endoscope was withdrawn. The patient was intubated for airway protection.  Once he was intubated, endoscope was passed again and starting to form a clots and bleeding site.  Endoscope was withdrawn.  The patient tolerated the procedure well and his vital signs were stable.  FINAL DIAGNOSES: 1. Esophageal ulcer possibly at previous banding sites with stigmata     of bleeding.  Attempt at hemostasis with hemoclip  and banding was     not successful resulting in recurrence of bleed. 2. Portal gastropathy, but no evidence of gastric varices. 3. Recurrent esophageal varices grade 2.  RECOMMENDATIONS: 1. He will be given 2 more units of PRBCs.  Chest x-ray will be     obtained. 2. We will arrange for the patient to be transferred to [facility] as he     would need TIPS. 3. The patient will remain intubated until he has had TIPS. 4. The patient also needs to be evaluated at a transplant center.          ______________________________ Lionel December, M.D.     NR/MEDQ  D:  04/11/2012  T:  04/11/2012  Job:  [id number]  cc:   R. Roetta Sessions, MD FACP [facility] [address] [place] [address]  Thelma Barge P. [place]esto Charon, M.D.

## 2012-04-11 NOTE — Progress Notes (Signed)
Subjective: This man came in with frank hematemesis, by his own estimation a couple of times. He has had 2 units of blood so far. He remains hemodynamically stable so far. He has a history of cirrhosis of the liver and also a history of esophageal varices.           Physical Exam: Blood pressure 105/63, pulse 99, temperature 97.8 F (36.6 C), temperature source Oral, resp. rate 24, height 5\' 7"  (1.702 m), weight 104.2 kg (229 lb 11.5 oz), SpO2 98.00%. Does look systemically well at the present time. He does not look shock clinically. He does not look pale, this is after 2 units of blood. Heart sounds are present and normal. Lung fields are clear abdomen is soft and nontender. He is alert and orientated.   Investigations:     Basic Metabolic Panel:  Basename 04/11/12 0051 04/11/12 0021  NA 144 139  K 4.6 4.6  CL 106 102  CO2 -- 25  GLUCOSE 169* 132*  BUN 14 14  CREATININE 0.90 1.01  CALCIUM -- 9.9  MG -- --  PHOS -- --   Liver Function Tests:  [facility] 04/11/12 0021  AST 44*  ALT 30  ALKPHOS 83  BILITOT 0.5  PROT 7.2  ALBUMIN 2.9*     CBC:  Basename 04/11/12 0113 04/11/12 0051 04/11/12 0021  WBC -- -- 9.1  NEUTROABS -- -- 5.7  HGB 9.8* 10.2* --  HCT 30.1* 30.0* --  MCV -- -- 93.9  PLT -- -- 142*    Dg Chest Port 1 View  04/11/2012  *RADIOLOGY REPORT*  Clinical Data: Upper GI bleed  PORTABLE CHEST - 1 VIEW  Comparison: 11/25/2011  Findings: Mildly degraded by patient rotation.  Allowing for this, the cardiomediastinal contours are unchanged.  Hypoaeration results in interstitial and vascular crowding.  Otherwise, no focal areas of consolidation.  No pleural effusion or pneumothorax.  No acute osseous abnormality identified.  Metallic densities project over the left humeral head.  IMPRESSION: Allowing for technique/shallow inspiration, no radiographic evidence of acute cardiopulmonary process.  Original Report Authenticated By: Waneta Martins, M.D.       Medications:  Scheduled:   . insulin aspart  0-9 Units Subcutaneous Q4H  . octreotide  100 mcg Intravenous Once  . pantoprazole      . pantoprazole (PROTONIX) IV  80 mg Intravenous STAT  . sodium chloride  1,000 mL Intravenous Once  . sodium chloride  1,000 mL Intravenous Once  . DISCONTD: sodium chloride   Intravenous STAT    Impression: 1. Acute upper GI bleed, likely secondary to bleeding esophageal varices. 2. Cirrhosis of the liver with chronic liver disease, secondary to [name] although history of alcohol abuse. 3. Hypertension. 4. Sleep apnea. 5. Obesity.    Plan: 1. Patient is n.p.o. 2. Repeat hemoglobin. 3. Emergent EGD, Dr Karilyn Cota, gastroenterologist is aware.     LOS: 0 days   Wilson Singer Pager [phone]  04/11/2012, 7:14 AM

## 2012-04-11 NOTE — Anesthesia Preprocedure Evaluation (Addendum)
Anesthesia Evaluation  Patient identified by MRN, date of birth, ID band Patient awake    Airway Mallampati: I TM Distance: >3 FB Neck ROM: Full    Dental  (+) Teeth Intact   Pulmonary sleep apnea ,    Pulmonary exam normal       Cardiovascular hypertension, Pt. on home beta blockers     Neuro/Psych Depression  Neuromuscular disease    GI/Hepatic GERD-  Medicated and Controlled,  Endo/Other  Diabetes mellitus-  Renal/GU      Musculoskeletal   Abdominal   Peds  Hematology  (+) Blood dyscrasia, ,   Anesthesia Other Findings   Reproductive/Obstetrics                           Anesthesia Physical Anesthesia Plan  ASA: III and Emergent  Anesthesia Plan: MAC   Post-op Pain Management:    Induction: Intravenous  Airway Management Planned: Simple Face Mask  Additional Equipment:   Intra-op Plan:   Post-operative Plan:   Informed Consent: I have reviewed the patients History and Physical, chart, labs and discussed the procedure including the risks, benefits and alternatives for the proposed anesthesia with the patient or authorized representative who has indicated his/her understanding and acceptance.   Dental advisory given  Plan Discussed with: Anesthesiologist  Anesthesia Plan Comments: (Bleeding encountered during MAC and decision made to intubate at 1105. See procedure note for details.Pt. Stable throughout procedure.1 unit transfused in Or and second unit infusing during transport to ICU. Maryan Puls CRNA )      Anesthesia Quick Evaluation

## 2012-04-11 NOTE — Consult Note (Signed)
[facility] Consultation  Requesting Provider: Dr. Tyson Alias PCCM Primary Care Physician:  Redmond Baseman, MD, MD Primary Gastroenterologist:  Dr. Jena Gauss  Reason for Consultation:  Esophageal bleeding, evaluate and treat  Assessment:  Acute upper GI bleeding..To be variceal versus ulcer from prior ligation or both. Associated hypovolemic shock and critical illness. 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      Recommendations: TIPS has been recommended by other gastroenterologists in no that is reasonable, he will be several days before that could be undertaken unless there is an emergency. After discussion with critical-care and interventional radiology, a decision has been made to reattempt endoscopy to see if other therapy can be provided. Risks benefits and indications of the procedure explained to the patient's wife to proceed. He is currently intubated and unable to really provide complete consent.     HPI: Kyle [name] is a 56 y.o. 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 Subsequently sent to ligation sessions with his primary gastroenterologist, Dr. Jena Gauss. The last was about a month ago. [facility] with acute hematemesis and melena. He underwent upper endoscopy today and an ulcer was seen there was active bleeding, this ulcer was in the distal esophagus with associated varices. Attempts at clipping the ulcer were unsuccessful, one ligating with band was placed on varices. Because of persistent bleeding he was transferred to the critical care service at [facility], with plans for TIPS placement. The patient was placed on levothyroid to improve blood pressure, he is currently awake but intubated and unable to communicate well. He denies pain. He's continued to have some melena but he has not had other  hematemesis.   Past Medical History  Diagnosis Date  . DM (diabetes mellitus)   . Cirrhosis     bx proven steatohepatitis with cirrhosis (2010); per note from Feb 2011, received Hep A and B vaccines in 2010  . HTN (hypertension)   . RLS (restless legs syndrome)   . Sleep apnea   . Hyperlipidemia   . IDA (iron deficiency anemia)   . GERD (gastroesophageal reflux disease)   . Depression   . Peripheral neuropathy   . Urothelial cancer     2010, paillary low-grade, h/o recurrence 2011  . B12 deficiency   . Psoriasis   . 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 Anemia due to multiple mechanisms 05/13/2011  . History of alcohol abuse 05/13/2011  . ANEMIA-IRON DEFICIENCY 03/09/2009  . S/P endoscopy May 2012    4 columns grade II esophageal varices; due for repeat in Nov 2013   . S/P colonoscopy May 2012    Tubular adenoma  . Low back pain   . Cancer     bladder ca 05/2009 removal and  w/chemo wash  . Esophageal varices with bleeding 11/24/11    s/p emergent EGD 11/25/11 by Dr. Rhea Belton at [facility], Grade III esophageal varices s/p banding X 5  . Small bowel obstruction 02/15/2012    Admitted to [facility], managed by Dr. Leticia Penna, ventral hernia manually reduced  . Ventral hernia 02/15/12  . MRSA (methicillin resistant Staphylococcus aureus)     Past Surgical History  Procedure Date  . Carpel tunnel   . Shoulder surgery   . Adrenal mass surgery 05/2009    benign, left  . Bladder surgery 01/2009 and 06/2010    cancer 2010, small recurrence in 06/2010  .  Colonoscopy 04/2009    moderate int hemorrhoids, rare sigmoid diverticula, one mm sessile hyperplastic rectal polyp  . Esophagogastroduodenoscopy 03/2009    small hh  . Small bowel capsule endoscopy 03/2009    couple of small benign appearing erosions, nonbleeding  . Egd/tcs 08/2007    small hiatal hernia, pancolonic diverticula, friable anal canal, 3cm salmon colored epithelium in distal esophagus, bx negative for Barrett's  . Skin cancer  excision Oct 2012    left arm  . Esophagogastroduodenoscopy 11/25/2011    Procedure: ESOPHAGOGASTRODUODENOSCOPY (EGD);  Surgeon: Erick Blinks, MD;  Location: [facility] ENDOSCOPY;  Service: Gastroenterology;  Laterality: Left;  . Esophagogastroduodenoscopy 01/14/2012    Dr Rourk->4-5 columns Gr2 varices, 6 bands placed, HH, distal esophageal ulcer, portal gastropathy, antral erosions  . Esophagogastroduodenoscopy 03/17/2012    Procedure: ESOPHAGOGASTRODUODENOSCOPY (EGD);  Surgeon: Corbin Ade, MD;  Location: [facility] ENDO SUITE;  Service: Endoscopy;  Laterality: N/A;  8:30  . Umbilical hernia repair 04/05/2012    Procedure: HERNIA REPAIR UMBILICAL ADULT;  Surgeon: Fabio Bering, MD;  Location: [facility] ORS;  Service: General;  Laterality: N/A;    Prior to Admission medications   Medication Sig Start Date End Date Taking? Authorizing Provider  fish oil-omega-3 fatty acids 1000 MG capsule Take 1 g by mouth 3 (three) times daily.    Yes Historical Provider, MD  furosemide (LASIX) 20 MG tablet Take 20 mg by mouth daily.    Yes Historical Provider, MD  gabapentin (NEURONTIN) 300 MG capsule Take 600-900 mg by mouth 2 (two) times daily. Patient takes 600mg  at about 1700 hours and then takes 900mg  at bedtime   Yes Historical Provider, MD  hydrocodone-ibuprofen (VICOPROFEN) 5-200 MG per tablet Take 1-2 tablets by mouth every 4 (four) hours as needed for pain. 04/05/12 04/15/12 Yes Fabio Bering, MD  insulin aspart (NOVOLOG) 100 UNIT/ML injection Inject 25-35 Units into the skin 2 (two) times daily. Sliding Scale Checks blood sugar 3 to 4 times daily   Yes Historical Provider, MD  insulin glargine (LANTUS) 100 UNIT/ML injection Inject 60 Units into the skin 2 (two) times daily. Before breakfast & Dinner   Yes Historical Provider, MD  lisinopril (PRINIVIL,ZESTRIL) 20 MG tablet Take 10 mg by mouth daily.    Yes Historical Provider, MD  magnesium oxide (MAG-OX) 400 MG tablet Take 400 mg by mouth daily.   Yes Historical Provider,  MD  metFORMIN (GLUCOPHAGE) 1000 MG tablet Take 1,000 mg by mouth 2 (two) times daily with a meal.     Yes Historical Provider, MD  Multiple Vitamin (MULTIVITAMIN) capsule Take 1 capsule by mouth daily.     Yes Historical Provider, MD  naproxen sodium (ALEVE) 220 MG tablet Take 220 mg by mouth 2 (two) times daily as needed. Pain   Yes Historical Provider, MD  omeprazole (PRILOSEC) 40 MG capsule Take 40 mg by mouth daily.   Yes Historical Provider, MD  pramipexole (MIRAPEX) 0.5 MG tablet Take 0.5 mg by mouth at bedtime.    Yes Historical Provider, MD  propranolol (INDERAL) 40 MG tablet Take 40 mg by mouth 2 (two) times daily.   Yes Historical Provider, MD  simvastatin (ZOCOR) 20 MG tablet Take 20 mg by mouth every evening.   Yes Historical Provider, MD  traZODone (DESYREL) 50 MG tablet Take 50 mg by mouth at bedtime.   Yes Historical Provider, MD  mupirocin ointment (BACTROBAN) 2 % Apply 1 application topically 2 (two) times daily.    Historical Provider, MD  Current Facility-Administered Medications  Medication Dose Route Frequency Provider Last Rate Last Dose  . 0.9 %  sodium chloride infusion  250 mL Intravenous PRN Simonne Martinet, NP      . 0.9 %  sodium chloride infusion   Intravenous Continuous Simonne Martinet, NP      . cefTRIAXone (ROCEPHIN) 1 g in dextrose 5 % 50 mL IVPB  1 g Intravenous Q24H Malissa Hippo, MD   1 g at 04/11/12 1300  . erythromycin 250 mg in sodium chloride 0.9 % 100 mL IVPB  250 mg Intravenous Once Malissa Hippo, MD   250 mg at 04/11/12 0929  . etomidate (AMIDATE) 2 MG/ML injection           . fentaNYL (SUBLIMAZE) 0.05 MG/ML injection           . fentaNYL (SUBLIMAZE) 0.05 MG/ML injection           . fentaNYL (SUBLIMAZE) 10 mcg/mL in sodium chloride 0.9 % 250 mL infusion  10 mcg/hr Intravenous Once Wilson Singer, MD 1 mL/hr at 04/11/12 1323 10 mcg/hr at 04/11/12 1323  . fentaNYL (SUBLIMAZE) 10 mcg/mL in sodium chloride 0.9 % 250 mL infusion  50-200 mcg/hr  Intravenous Titrated Simonne Martinet, NP 5 mL/hr at 04/11/12 1548 50 mcg/hr at 04/11/12 1548   And  . fentaNYL (SUBLIMAZE) bolus via infusion 50-100 mcg  50-100 mcg Intravenous Q6H PRN Simonne Martinet, NP      . fentaNYL (SUBLIMAZE) injection 100 mcg  100 mcg Intravenous Once Nelda Bucks, MD   100 mcg at 04/11/12 1323  . insulin aspart (novoLOG) injection 0-9 Units  0-9 Units Subcutaneous Q4H Vania Rea, MD      . lidocaine (cardiac) 100 mg/79ml (XYLOCAINE) 20 MG/ML injection 2%           . lidocaine (XYLOCAINE) 1 % injection           . lidocaine (XYLOCAINE) 1 % injection           . midazolam (VERSED) 2 MG/2ML injection           . midazolam (VERSED) 2 MG/2ML injection           . midazolam (VERSED) bolus via infusion 1-2 mg  1-2 mg Intravenous Q2H PRN Simonne Martinet, NP      . norepinephrine (LEVOPHED) 4,000 mcg in dextrose 5 % 250 mL infusion  2-50 mcg/min Intravenous Once Nimish C Gosrani, MD 37.5 mL/hr at 04/11/12 1315 10 mcg/min at 04/11/12 1315  . norepinephrine (LEVOPHED) 4,000 mcg in dextrose 5 % 250 mL infusion  2-50 mcg/min Intravenous Continuous Simonne Martinet, NP 11.3 mL/hr at 04/11/12 1531 3 mcg/min at 04/11/12 1531  . octreotide (SANDOSTATIN) 2 mcg/mL in sodium chloride 0.9 % 250 mL infusion  25-50 mcg/hr Intravenous Continuous Vida Roller, MD 12.5 mL/hr at 04/11/12 0104 25 mcg/hr at 04/11/12 0104  . octreotide (SANDOSTATIN) 2 mcg/mL in sodium chloride 0.9 % 250 mL infusion  25-50 mcg/hr Intravenous Continuous Vida Roller, MD 12.5 mL/hr at 04/11/12 1400 25 mcg/hr at 04/11/12 1400  . octreotide (SANDOSTATIN) 2 mcg/mL load via infusion 100 mcg  100 mcg Intravenous Once Vida Roller, MD   100 mcg at 04/11/12 0056  . ondansetron (ZOFRAN) injection 4 mg  4 mg Intravenous Q8H PRN Vida Roller, MD      . ondansetron [facility]) injection 4 mg  4 mg Intravenous Q4H PRN Vania Rea, MD      .  pantoprazole (PROTONIX) 40 MG injection           . pantoprazole  (PROTONIX) 80 mg in sodium chloride 0.9 % 100 mL IVPB  80 mg Intravenous STAT Vida Roller, MD   80 mg at 04/11/12 0046  . pantoprazole (PROTONIX) 80 mg in sodium chloride 0.9 % 250 mL infusion  8 mg/hr Intravenous Continuous Vida Roller, MD 25 mL/hr at 04/11/12 1400 8 mg/hr at 04/11/12 1400  . propofol (DIPRIVAN) 10 mg/ml infusion           . propofol (DIPRIVAN) 10 mg/ml infusion           . propofol (DIPRIVAN) 10 mg/ml infusion           . propofol (DIPRIVAN) 10 mg/ml infusion  5-70 mcg/kg/min Intravenous Continuous Simonne Martinet, NP      . propofol (DIPRIVAN) 10 mg/ml infusion           . rocuronium (ZEMURON) 50 MG/5ML injection           . rocuronium (ZEMURON) 50 MG/5ML injection           . sodium chloride 0.9 % bolus 1,000 mL  1,000 mL Intravenous Once Vida Roller, MD   1,000 mL at 04/11/12 0040  . sodium chloride 0.9 % bolus 1,000 mL  1,000 mL Intravenous Once Vida Roller, MD   1,000 mL at 04/11/12 0041  . sodium chloride 0.9 % injection           . succinylcholine (ANECTINE) 20 MG/ML injection           . succinylcholine (ANECTINE) 20 MG/ML injection           . DISCONTD: 0.9 %  sodium chloride infusion   Intravenous STAT Vida Roller, MD      . DISCONTD: 0.9 % NaCl with KCl 20 mEq/ L  infusion   Intravenous Continuous Vania Rea, MD 150 mL/hr at 04/11/12 1400    . DISCONTD: fentaNYL (SUBLIMAZE) 0.05 MG/ML injection           . DISCONTD: HYDROmorphone (DILAUDID) injection 0.5 mg  0.5 mg Intravenous Q2H PRN Vania Rea, MD      . DISCONTD: midazolam (VERSED) 1 mg/mL in sodium chloride 0.9 % 50 mL infusion  1 mg/hr Intravenous Once Nimish Normajean Glasgow, MD      . DISCONTD: midazolam (VERSED) 1 mg/mL in sodium chloride 0.9 % 50 mL infusion  2-10 mg/hr Intravenous Titrated Simonne Martinet, NP      . DISCONTD: midazolam (VERSED) injection 4 mg  4 mg Intravenous Once Nelda Bucks, MD      . DISCONTD: propofol (DIPRIVAN) 10 mg/ml infusion  5-70 mcg/kg/min Intravenous  Titrated Wilson Singer, MD   40 mcg/kg/min at 04/11/12 1300  . DISCONTD: simethicone susp in sterile water 1000 mL irrigation    PRN Malissa Hippo, MD      . DISCONTD: sterile water for irrigation for irrigation    PRN Malissa Hippo, MD   1,000 mL at 04/11/12 1039   Facility-Administered Medications Ordered in Other Encounters  Medication Dose Route Frequency Provider Last Rate Last Dose  . DISCONTD: 0.9 %  sodium chloride infusion    Continuous PRN Franco Nones, CRNA      . DISCONTD: fentaNYL (SUBLIMAZE) injection    PRN Franco Nones, CRNA   25 mcg at 04/11/12 1153  . DISCONTD: lidocaine (cardiac) 50 mg/54ml (XYLOCAINE) injection 1%  PRN Franco Nones, CRNA   10 mg at 04/11/12 1038  . DISCONTD: midazolam (VERSED) 5 MG/5ML injection    PRN Franco Nones, CRNA   0.5 mg at 04/11/12 1108  . DISCONTD: propofol (DIPRIVAN) 10 mg/ml infusion    Continuous PRN Franco Nones, CRNA   100 mcg/kg/min at 04/11/12 1104  . DISCONTD: propofol (DIPRIVAN) 10 mg/ml infusion    PRN Franco Nones, CRNA   20 mg at 04/11/12 1108  . DISCONTD: rocuronium (ZEMURON) injection    PRN Franco Nones, CRNA   20 mg at 04/11/12 1123  . DISCONTD: succinylcholine (ANECTINE) injection    PRN Franco Nones, CRNA   100 mg at 04/11/12 1108    Allergies as of 04/11/2012 - Review Complete 04/11/2012  Allergen Reaction Noted  . Tylenol (acetaminophen) Other (See Comments) 07/17/2011    Family History  Problem Relation Age of Onset  . Cirrhosis Father     etoh  . Colon cancer Neg Hx   . Anesthesia problems Neg Hx   . Hypotension Neg Hx   . Malignant hyperthermia Neg Hx   . Pseudochol deficiency Neg Hx   . Kidney cancer Mother   . Cancer Mother   . HIV Brother   . Cirrhosis Brother - twin     nash    History   Social History  . Marital Status: Married    Spouse Name: N/A    Number of Children: 3       Occupational History  . Disabled truck driver    Social History Main Topics  . Smoking  status: Current Everyday Smoker -- 1.5 packs/day for 30 years    Types: Cigarettes  . Smokeless tobacco: Not on file  . Alcohol Use: No     drank heavily for few years in 20s  . Drug Use: No  . Sexually Active: Yes    Birth Control/ Protection: None                 Review of Systems: Positive for recent umbilical hernia repair, on 04/05/2012. Having no problems afterwards. Unable to obtain complete review of systems otherwise due to and being intubated. Physical Exam: Vital signs in last 24 hours: Temp:  [97.8 F (36.6 C)-98.2 F (36.8 C)] 98.1 F (36.7 C) (05/19 1445) Pulse Rate:  [74-104] 79  (05/19 1615) Resp:  [0-28] 14  (05/19 1615) BP: (63-146)/(44-111) 142/85 mmHg (05/19 1600) SpO2:  [92 %-100 %] 100 % (05/19 1615) FiO2 (%):  [59.5 %-100 %] 59.5 % (05/19 1615) Weight:  [229 lb 11.5 oz (104.2 kg)-230 lb (104.327 kg)] 229 lb 11.5 oz (104.2 kg) (05/19 0405) Last BM Date: 04/11/12 General:   Intubated, awake, following commands Eyes:  Sclera clear, no icterus.   Conjunctiva pink. Mouth: ETT in place  Oropharynx pink & moist. Neck:  Supple; no masses or thyromegaly. Lungs:  Clear throughout to auscultation anterior.   Heart:  Regular rate and rhythm; no murmurs, clicks, rubs,  or gallops. Abdomen:  Obese with fresh umbilical hernia repair wound steristripped and some surrounding erythema and ichthyotic skin nontender without  HSM, induration LLQ  Rectal:    Extremities:  No edema Neurologic:  Alert, no asterixis Skin: extensor surfaces of large joints with raised and indurated erythematous plaques Lymph Nodes:  No significant cervical or supraclavicular adenopathy.   Intake/Output from previous day: 05/18 0701 - 05/19 0700 In: 4432.1 [I.V.:3873.8; Blood:558.3] Out: 1600 [Urine:200; Emesis/NG output:1400]   Lab Results:  Basename 04/11/12 1300 04/11/12 0715 04/11/12 0113 04/11/12 0021  WBC 6.1 3.8* -- 9.1  HGB 9.9* 9.6* 9.8* --  HCT 29.4* 28.8* 30.1* --  PLT  77* 69* -- 142*   BMET  Basename 04/11/12 0715 04/11/12 0051 04/11/12 0021  NA 141 144 139  K 5.0 4.6 4.6  CL 109 106 102  CO2 26 -- 25  GLUCOSE 127* 169* 132*  BUN 16 14 14   CREATININE 0.88 0.90 1.01  CALCIUM 8.3* -- 9.9   LFT  Basename 04/11/12 0715  PROT 5.6*  ALBUMIN 2.4*  AST 39*  ALT 28  ALKPHOS 55  BILITOT 1.4*  BILIDIR --  IBILI --   PT/INR  Basename 04/11/12 0021  LABPROT 16.0*  INR 1.25     Studies/Results: Dg Chest Port 1 View  04/11/2012  *RADIOLOGY REPORT*  Clinical Data: Status post intubation.  Evaluate for aspiration or hemoptysis.  PORTABLE CHEST - 1 VIEW  Comparison: Chest x-ray 04/11/2012.  Findings: Study is limited by low lung volumes, patient rotation to the left, and extensive gross patient motion.  Within these limitations, the right lung appears relatively clear.  There are extensive opacities throughout the left lung, which are poorly defined secondary to motion.  There may be blunting of the left costophrenic sulcus, which could suggest a small left-sided pleural effusion.  No definite right-sided pleural effusion.  The patient is rotated to the left on today's exam, resulting in distortion of the mediastinal contours and reduced diagnostic sensitivity and specificity for mediastinal pathology.  IMPRESSION: 1.  Severely limited examination secondary to patient positioning and gross patient motion.  There appear to be new opacities throughout the left lung, however these are poorly characterized. Differential considerations would include aspiration, infection, and/or alveolar hemorrhage. A repeat study (preferably a standing PA and lateral) would be useful for further delineation if clinically indicated.  Original Report Authenticated By: Florencia Reasons, M.D.   Dg Chest Port 1 View  04/11/2012  *RADIOLOGY REPORT*  Clinical Data: Upper GI bleed  PORTABLE CHEST - 1 VIEW  Comparison: 11/25/2011  Findings: Mildly degraded by patient rotation.  Allowing  for this, the cardiomediastinal contours are unchanged.  Hypoaeration results in interstitial and vascular crowding.  Otherwise, no focal areas of consolidation.  No pleural effusion or pneumothorax.  No acute osseous abnormality identified.  Metallic densities project over the left humeral head.  IMPRESSION: Allowing for technique/shallow inspiration, no radiographic evidence of acute cardiopulmonary process.  Original Report Authenticated By: Waneta Martins, M.D.     Previous Endoscopies:  Today at [facility] - bleeding esophageal varices with ulcer, attempted clip and banding unsuccessful     LOS: 0 days      @Burlie Cajamarca  Sena Slate, MD, [facility] [phone] (pager) 04/11/2012 4:25 PM@

## 2012-04-11 NOTE — Progress Notes (Signed)
Patient has undergone EGD. Grade 2 esophageal varices was seen and also there was a distal esophageal ulcer with fresh clot. Application of hemoclips was not successful and resulted in active bleeding. Single band applied distal to the esophageal ulcer but this was not successful. Dr Karilyn Cota feels that there possibly may be active oozing. The patient was intubated for airway protection. Dr Karilyn Cota has spoken with interventional radiology and the plan is for the patient to have a TIPS procedure. I've spoken with Dr. Tyson Alias, critical care medicine, who is kindly accepted the patient in transfer. At the present time the patient is hemodynamically stable, receiving 2 further units of blood, intubated and mechanically ventilated.

## 2012-04-11 NOTE — Consult Note (Addendum)
Patient interviewed and examined. See dictated consult for details. Her GI bleed with hemodynamic instability in a patient with cirrhosis complicated by variceal bleeding. He presented with GI bleed on new years and was banded on 11/25/2011 at [facility]. Subsequent banding sessions on 01/14/2012 and 03/17/2012. Patient had received 2 units of PRBCs. He had uneventful umbilicus hernia [place] on 04/05/2012. Patient undergo EGD with therapeutic intervention with propofol this morning. Patient is agreeable.

## 2012-04-11 NOTE — Anesthesia Postprocedure Evaluation (Signed)
  Anesthesia Post-op Note  Patient: Kyle [name]  Procedure(s) Performed: Procedure(s) (LRB): ESOPHAGOGASTRODUODENOSCOPY (EGD) WITH PROPOFOL (N/A)  Patient Location: ICU  Anesthesia Type: MAC and General  Level of Consciousness: sedated  Airway and Oxygen Therapy: Patient remains intubated per anesthesia plan  Post-op Pain: pt sedated   Post-op Assessment: Post-op Vital signs reviewed, Patient's Cardiovascular Status Stable and Respiratory Function Stable  Post-op Vital Signs: Reviewed and stable  Complications: No apparent anesthesia complications

## 2012-04-11 NOTE — Progress Notes (Signed)
At 1200 Dr Karilyn Cota called to unit stating pt would be coming back on ventilator.  Pt arrived from endo at 1215 intubated and finishing up 4th unit of blood. Dr Karilyn Cota spoke to pt wife Tammy and explained that patient would be transferring to [facility]. Dr Karilyn Cota to speak with Dr Karilyn Cota about transferring pt. Pts wife Tammy in agreement with transferring pt.  1300 [facility] on floor to transfer pt to 2100 at [facility]. New orders received from Dr Tyson Alias.  At 1415 pt left with [facility].

## 2012-04-11 NOTE — ED Notes (Addendum)
Pt c/o vomiting bright red blood that started 30 min PTA. Pt has trash can and moderate amount of blood noted. Pt with HX of esophageal varices

## 2012-04-11 NOTE — Brief Op Note (Signed)
04/11/2012  5:36 PM  PATIENT:  [name]  56 y.o. male  PRE-OPERATIVE DIAGNOSIS:  GI bleed - varices suspected  POST-OPERATIVE DIAGNOSIS:  ulcer from prior banding band X 1, bicap used  PROCEDURE:  Procedure(s) (LRB): ESOPHAGOGASTRODUODENOSCOPY (EGD) (N/A)  SURGEON:  Surgeon(s) and Role:    Iva Boop, MD - Primary   ANESTHESIA:   IV sedation Propofol and midazolam   Distal esophageal ulcer with adherent clot and visible vessel. Not actively bleeding. Previously ligated varix.  I ligated one other varix with band, then used Bicap probe to cauterize the ulcer vessel.

## 2012-04-11 NOTE — Consult Note (Signed)
NAMENEVEN, FINA NO.:  [id number]  MEDICAL RECORD NO.:  [id number]  LOCATION:  2105                         FACILITY:  [facility]  PHYSICIAN:  Lionel December, M.D.    DATE OF BIRTH:  [birth year]  DATE OF CONSULTATION:  04/11/2012 DATE OF DISCHARGE:                                CONSULTATION   REASON FOR CONSULTATION:  Hematemesis in a patient with history of esophageal variceal bleed.  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 Jena Gauss who was doing well until around 11 p.m. when he noted abdominal distention followed by nausea as he was going to go to bed.  He shortly thereafter vomited large amount of bright red blood.  He had hematemesis and he was brought to emergency room.  The patient was evaluated by Dr. Eber Hong.  He was initially hypotensive.  He had few more episodes of hematemesis in the emergency room.  The patient was begun on octreotide infusion and resuscitated with IV fluids, begun on PPI, and admitted to ICU.  The patient has not had any more episodes of hematemesis in the last 5 hours.  He has had at least 2 tarry stools, the last one was small.  This morning he feels little queasy, but denies abdominal or chest pain. The patient reports no dysphagia or odynophagia.  He did not have any problems.  Actually falling his last banding session. He has received 2 units of PRBCs.  CURRENT MEDICATIONS: 1. Pantoprazole 8 mg IV as continuous infusion. 2. Hydromorphone 0.5 mg IV q.2 p.r.n. pain. 3. Ondansetron 4 mg IV q.4 p.r.n. 4. Octreotide infusion at a rate of 25 mcg per hour continuously. 5. Insulin aspartate via sliding scale.  At home, he has been on furosemide, gabapentin, Vicoprofen 5/200 one to two tablets q.4 p.r.n., NovoLog 25-35 units b.i.d. depending on sugar levels, Lantus  60 units b.i.d., lisinopril 10 mg p.o. daily, metformin 1000 mg p.o. b.i.d., MVI daily, Bactroban 2% topical b.i.d., pramipexole 0.5 mg p.o. nightly, omeprazole 40 mg p.o. q.a.m., propranolol 40 mg p.o. b.i.d., simvastatin 20 mg p.o. daily, trazodone 50 mg p.o. nightly, and fish oil 1 g p.o. t.i.d.  PAST MEDICAL HISTORY:  Diabetes mellitus for about 12 years' duration.  Hypertension, 12 years' duration.  Obesity.  He states he has been obese most of his adult life.  Peak weight was 259 pounds, now he is 229 pounds.  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  He has been vaccinated for hep A and B.  He presented with esophageal variceal bleed on New Years last year.  He was at [facility], underwent initial banding by Dr. Vedia Pereyra.  Subsequent banding sessions were by Dr. Jena Gauss on January 14, 2012, when he had 6 bands applied and most recently on March 17, 2012, 10 bands were used to bleed varices.  He did not have any immediate postprocedure complications.  History of iron deficiency anemia.  He has had a colonoscopy without significant findings.  He has required intermittent parenteral iron.  Hyperlipidemia.  Chronic GERD.  Questionable history of sleep apnea.  He states he has a CPAP monitor at home, but he has not been advised to use it.  History of bladder cancer, treated cystoscopically in 2010 and again in 2011 and he is under care of Dr. Laverle Patter of [facility] and just saw him 4 months ago.  He does not have a residual disease.  He had surgery for adrenal mass, possibly 3 years ago and was benign.  He has restless legs syndrome.  He had left shoulder surgery, repair of rotator cuff in 2011.  He had bilateral cataract surgery last year.  In March of this year, he was admitted with small bowel obstruction, felt to be secondary to umbilical hernia.  He had this hernia fixed by Dr. Leticia Penna on Apr 05, 2012.  Finally, he  has had decompression of right carpal tunnel syndrome about 4 years ago.  ALLERGIES:  Tylenol.  It makes his restless legs syndrome worse.  FAMILY HISTORY:  Father died at young age, he had alcoholic cirrhosis. Mother is 66 and in good health.  He has 1 twin brother who also has cirrhosis secondary to NAFLD and under care of Dr. Darrick Penna.  SOCIAL HISTORY:  He is married.  He has 3 sons, in good health.  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  He has been smoking for 20-25 cigarettes per day for the last 25 years.  He has had very few drinks of alcohol in his lifetime, but none in the last 35 years.  PHYSICAL EXAMINATION:  VITAL SIGNS:  Admission weight 229 pounds.  He is 67 inches' tall.  Pulse 81 per minute, blood pressure 99/68, respirations 18, and temp is 97.8. HEENT:  Conjunctivae are pink.  Sclerae are nonicteric.  Oropharyngeal mucosa is normal.  Dentition in satisfactory condition. NECK:  No neck masses or thyromegaly noted. CARDIAC:  Regular rhythm.  Normal S1 and S2.  No murmur or gallop noted. LUNGS:  Clear to auscultation. ABDOMEN:  Protuberant.  He has Steri-Strips over the site of umbilical hernia repair.  There is some erythema to skin.  Bowel sounds are normal.  On palpation, he has indurated area in left lower quadrant, felt to be site of insulin injection.  Spleen is not palpable.  Liver is enlarged about 6-7 cm below ostium laterally.  It is firm and minimally tender.  No LE edema noted. NEUROLOGIC:  The patient is alert and does not have asterixis.  LABORATORY DATA ON ADMISSION:  WBC 9.1, H and H 12 and 37, and platelet count is 142,000.  INR 1.25, PTT 32.  Electrolytes are normal.  Glucose 132, BUN 14, creatinine 1.01, calcium 9.9, bilirubin 0.5, AP 83, AST 44, ALT 30, total protein 7.2 with albumin of 2.9.  His H and H early this morning was 9.8 and 30.1 and most recent H and H was 9.6 and 28.8 from 7 o'clock this morning.  His BUN this  morning was 16 and creatinine 0.88.  ASSESSMENT:  Upper gastrointestinal bleed with hemodynamics instability in a patient with cirrhosis, complicated by esophageal variceal bleed. He had upper gastrointestinal bleed  New Year eve and  underwent banding at [facility] and he has had 2 more banding sessions, one in February and most recently on March 17, 2012.  He has been using OTC Naprosyn and may have taken Vicoprofen.  Therefore, he could have bleeding ulcer.  He could have recurrent variceal bleed.  Last banding was almost a month ago, so it is very high likelihood he has bleeding from ulcer at a banding site.  The patient's blood pressure has come up, but it is still low.  RECOMMENDATIONS:  Esophagogastroduodenoscopy with therapeutic intention. Since I am not sure what we are getting into, we will request help from Anesthesia, so that exam would be performed with propofol.  I have reviewed the procedure risks with the patient and he is agreeable.  We appreciate the opportunity to participate in the case of this gentleman.          ______________________________ Lionel December, M.D.     NR/MEDQ  D:  04/11/2012  T:  04/11/2012  Job:  [id number]  cc:   R. Roetta Sessions, MD FACP [facility] [address] [place] [address]  Thelma Barge P. Modesto Charon, M.D.

## 2012-04-11 NOTE — Progress Notes (Signed)
Pt arrived via [facility] on stretcher from [facility].

## 2012-04-11 NOTE — Progress Notes (Signed)
Pt transferred to [facility] 2100 via [facility]. Report called to [facility].

## 2012-04-11 NOTE — ED Provider Notes (Signed)
History     CSN: [id number]  Arrival date & time 04/11/12  0001   First MD Initiated Contact with Patient 04/11/12 0021      Chief Complaint  Patient presents with  . Hematemesis    (Consider location/radiation/quality/duration/timing/severity/associated sxs/prior treatment) HPI Comments: 56 year old male with a history of hypertension, gastroesophageal reflux disease, B12 deficiency, esophageal varices secondary to cirrhosis of unknown etiology who presents with an acute upper GI bleed. This was acute in onset approximately 45 minutes prior to arrival, has been intermittent, voluminous, severe and associated with significant weakness and nausea. He denies any symptoms prior to this, has had esophageal banding prior to recent hernia surgery which was approximately 5 days ago.  He denies being on any blood thinners  The history is provided by the patient, the spouse and medical records.    Past Medical History  Diagnosis Date  . DM (diabetes mellitus)   . Cirrhosis     bx proven steatohepatitis with cirrhosis (2010); per note from Feb 2011, received Hep A and B vaccines in 2010  . HTN (hypertension)   . RLS (restless legs syndrome)   . Sleep apnea   . Hyperlipidemia   . IDA (iron deficiency anemia)   . GERD (gastroesophageal reflux disease)   . Depression   . Peripheral neuropathy   . Urothelial cancer     2010, paillary low-grade, h/o recurrence 2011  . B12 deficiency   . Psoriasis   . 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 Anemia due to multiple mechanisms 05/13/2011  . History of alcohol abuse 05/13/2011  . ANEMIA-IRON DEFICIENCY 03/09/2009  . S/P endoscopy May 2012    4 columns grade II esophageal varices; due for repeat in Nov 2013   . S/P colonoscopy May 2012    Tubular adenoma  . Low back pain   . Cancer     bladder ca 05/2009 removal and  w/chemo wash  . Esophageal varices with bleeding 11/24/11    s/p emergent EGD 11/25/11 by Dr. Rhea Belton at [facility], Grade  III esophageal varices s/p banding X 5  . Small bowel obstruction 02/15/2012    Admitted to [facility], managed by Dr. Leticia Penna, ventral hernia manually reduced  . Ventral hernia 02/15/12  . MRSA (methicillin resistant Staphylococcus aureus)     Past Surgical History  Procedure Date  . Carpel tunnel   . Shoulder surgery   . Adrenal mass surgery 05/2009    benign, left  . Bladder surgery 01/2009 and 06/2010    cancer 2010, small recurrence in 06/2010  . Colonoscopy 04/2009    moderate int hemorrhoids, rare sigmoid diverticula, one mm sessile hyperplastic rectal polyp  . Esophagogastroduodenoscopy 03/2009    small hh  . Small bowel capsule endoscopy 03/2009    couple of small benign appearing erosions, nonbleeding  . Egd/tcs 08/2007    small hiatal hernia, pancolonic diverticula, friable anal canal, 3cm salmon colored epithelium in distal esophagus, bx negative for Barrett's  . Skin cancer excision Oct 2012    left arm  . Esophagogastroduodenoscopy 11/25/2011    Procedure: ESOPHAGOGASTRODUODENOSCOPY (EGD);  Surgeon: Erick Blinks, MD;  Location: [facility] ENDOSCOPY;  Service: Gastroenterology;  Laterality: Left;  . Esophagogastroduodenoscopy 01/14/2012    Dr Rourk->4-5 columns Gr2 varices, 6 bands placed, HH, distal esophageal ulcer, portal gastropathy, antral erosions  . Esophagogastroduodenoscopy 03/17/2012    Procedure: ESOPHAGOGASTRODUODENOSCOPY (EGD);  Surgeon: Corbin Ade, MD;  Location: [facility] ENDO SUITE;  Service: Endoscopy;  Laterality: N/A;  8:30  .  Umbilical hernia repair 04/05/2012    Procedure: HERNIA REPAIR UMBILICAL ADULT;  Surgeon: Fabio Bering, MD;  Location: [facility] ORS;  Service: General;  Laterality: N/A;    Family History  Problem Relation Age of Onset  . Cirrhosis Father     etoh  . Colon cancer Neg Hx   . Anesthesia problems Neg Hx   . Hypotension Neg Hx   . Malignant hyperthermia Neg Hx   . Pseudochol deficiency Neg Hx   . Kidney cancer Mother   . Cancer Mother   . HIV Brother   .  Cirrhosis Brother     nash    History  Substance Use Topics  . Smoking status: Current Everyday Smoker -- 1.5 packs/day for 30 years    Types: Cigarettes  . Smokeless tobacco: Not on file  . Alcohol Use: No     drank heavily for few years in 20s      Review of Systems  All other systems reviewed and are negative.    Allergies  Tylenol  Home Medications   Current Outpatient Rx  Name Route Sig Dispense Refill  . DOXYCYCLINE HYCLATE 100 MG PO CAPS Oral Take 1 capsule (100 mg total) by mouth 2 (two) times daily. For 10 days 20 capsule 0  . OMEGA-3 FATTY ACIDS 1000 MG PO CAPS Oral Take 1 g by mouth 3 (three) times daily.     . FUROSEMIDE 20 MG PO TABS Oral Take 20 mg by mouth daily.     [name] GABAPENTIN 300 MG PO CAPS Oral Take 600-900 mg by mouth 2 (two) times daily. Patient takes 600mg  at about 1700 hours and then takes 900mg  at bedtime    . HYDROCODONE-IBUPROFEN 5-200 MG PO TABS Oral Take 1-2 tablets by mouth every 4 (four) hours as needed for pain. 45 tablet 0  . INSULIN ASPART 100 UNIT/ML [place] SOLN Subcutaneous Inject 25-35 Units into the skin 2 (two) times daily. Sliding Scale Checks blood sugar 3 to 4 times daily    . INSULIN GLARGINE 100 UNIT/ML [place] SOLN Subcutaneous Inject 60 Units into the skin 2 (two) times daily. Before breakfast & Dinner    . LISINOPRIL 20 MG PO TABS Oral Take 10 mg by mouth daily.     [name] MAGNESIUM OXIDE 400 MG PO TABS Oral Take 400 mg by mouth daily.    [name] METFORMIN HCL 1000 MG PO TABS Oral Take 1,000 mg by mouth 2 (two) times daily with a meal.      . MULTIVITAMINS PO CAPS Oral Take 1 capsule by mouth daily.      [name] MUPIROCIN 2 % EX OINT Topical Apply 1 application topically 2 (two) times daily.    [name] NAPROXEN SODIUM 220 MG PO TABS Oral Take 220 mg by mouth 2 (two) times daily as needed. Pain    . OMEPRAZOLE 40 MG PO CPDR Oral Take 40 mg by mouth daily.    [name] PRAMIPEXOLE DIHYDROCHLORIDE 0.5 MG PO TABS Oral Take 0.5 mg by mouth at bedtime.     [name] PROPRANOLOL  HCL 40 MG PO TABS Oral Take 40 mg by mouth 2 (two) times daily.    [name] SIMVASTATIN 20 MG PO TABS Oral Take 20 mg by mouth every evening.    . TRAZODONE HCL 50 MG PO TABS Oral Take 50 mg by mouth at bedtime.      BP 92/50  Pulse 104  Temp(Src) 97.9 F (36.6 C) (Oral)  Resp 20  Ht 5\' 7"  (  1.702 m)  Wt 230 lb (104.327 kg)  BMI 36.02 kg/m2  SpO2 98%  Physical Exam  Nursing note and vitals reviewed. Constitutional: He appears well-developed and well-nourished. He appears distressed.       Pale, diaphoretic  HENT:  Head: Normocephalic and atraumatic.       Bright red blood in the mouth and oropharynx  Eyes: Conjunctivae and EOM are normal. Pupils are equal, round, and reactive to light. Right eye exhibits no discharge. Left eye exhibits no discharge. No scleral icterus.  Neck: Normal range of motion. Neck supple. No JVD present. No thyromegaly present.  Cardiovascular: Normal rate, regular rhythm, normal heart sounds and intact distal pulses.  Exam reveals no gallop and no friction rub.   No murmur heard. Pulmonary/Chest: Effort normal and breath sounds normal. No respiratory distress. He has no wheezes. He has no rales.  Abdominal: Soft. Bowel sounds are normal. He exhibits no distension and no mass. There is no tenderness.       Periumbilical postop incision appears healed, Steri-Strips in place, nontender abdomen, normal bowel sounds  Musculoskeletal: Normal range of motion. He exhibits no edema and no tenderness.  Lymphadenopathy:    He has no cervical adenopathy.  Neurological: He is alert. Coordination normal.       Generalized weakness, clear speech, no focal neurologic deficits  Skin: Skin is warm.       Pale skin, diaphoretic  Psychiatric: He has a normal mood and affect. His behavior is normal.    ED Course  Procedures (including critical care time)  Labs Reviewed  CBC - Abnormal; Notable for the following:    RBC 3.94 (*)    Hemoglobin 12.0 (*)    HCT 37.0 (*)    RDW  15.9 (*)    Platelets 142 (*)    All other components within normal limits  COMPREHENSIVE METABOLIC PANEL - Abnormal; Notable for the following:    Glucose, Bld 132 (*)    Albumin 2.9 (*)    AST 44 (*)    GFR calc non Af Amer 82 (*)    All other components within normal limits  PROTIME-INR - Abnormal; Notable for the following:    Prothrombin Time 16.0 (*)    All other components within normal limits  DIFFERENTIAL  APTT  TYPE AND SCREEN  LACTIC ACID, PLASMA   No results found.   1. Upper GI bleed   2. Hemorrhagic shock       MDM  Ill-appearing hypotensive male with upper GI bleed likely from esophageal varices, IV fluid bolus, starting second large-bore IV, labs, octreotide infusion, Protonix bolus and drip, pulmonary critical care and gastroenterology paged.   Immediately discussed with pulmonary critical care, gastroenterology at our hospital is available by Dr. Renae Fickle who has agreed to see the patient in the intensive care unit immediately. 2 large-bore IVs and IV fluids open treated, as improved blood pressure up to 100 systolic, still appears ill, [facility] hospitalist Dr. Orvan Falconer will see the patient and has agreed to admit the patient to the intensive.[name]  CRITICAL CARE Performed by: Vida Roller   Total critical care time: 35  Critical care time was exclusive of separately billable procedures and treating other patients.  Critical care was necessary to treat or prevent imminent or life-threatening deterioration.  Critical care was time spent personally by me on the following activities: development of treatment plan with patient and/or surrogate as well as nursing, discussions with consultants, evaluation of patient's response to treatment,  examination of patient, obtaining history from patient or surrogate, ordering and performing treatments and interventions, ordering and review of laboratory studies, ordering and review of radiographic studies, pulse oximetry and  re-evaluation of patient's condition.   Vida Roller, MD 04/11/12 [phone]

## 2012-04-11 NOTE — OR Nursing (Signed)
Report given to Michelle RN

## 2012-04-11 NOTE — Transfer of Care (Signed)
Immediate Anesthesia Transfer of Care Note  Patient: Kyle [name]  Procedure(s) Performed: Procedure(s) (LRB): ESOPHAGOGASTRODUODENOSCOPY (EGD) WITH PROPOFOL (N/A)  Patient Location: ICU  Anesthesia Type: MAC and General  Level of Consciousness: sedated, patient cooperative and responds to stimulation  Airway & Oxygen Therapy: Patient remains intubated per anesthesia plan  Post-op Assessment: Report given to PACU RN, Post -op Vital signs reviewed and stable and Patient moving all extremities  Post vital signs: Reviewed and stable  Complications: No apparent anesthesia complications

## 2012-04-11 NOTE — H&P (Signed)
Name: Kyle [name] MRN: [id number] DOB: [birth year]    LOS: 0   PULMONARY / CRITICAL CARE MEDICINE  HPI:  56 yo male transferred to [facility] 5/19 from [facility] for esophageal varices with a known history of Cirrhosis/[name], portal HTN, DM. HTN. Patient presented to ED at [facility] 5/19 due to vomiting bright red blood. EGD was preformed and hemostasis was unable to be achieved, and patient was intubated for airway protection. Patient became hypotensive, recieved 5 units PRBCs and was transferred to [facility] for possible TIPS. PCCM admitting.    Past Medical History  Diagnosis Date  . DM (diabetes mellitus)   . Cirrhosis     bx proven steatohepatitis with cirrhosis (2010); per note from Feb 2011, received Hep A and B vaccines in 2010  . HTN (hypertension)   . RLS (restless legs syndrome)   . Sleep apnea   . Hyperlipidemia   . IDA (iron deficiency anemia)   . GERD (gastroesophageal reflux disease)   . Depression   . Peripheral neuropathy   . Urothelial cancer     2010, paillary low-grade, h/o recurrence 2011  . B12 deficiency   . Psoriasis   . 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 Anemia due to multiple mechanisms 05/13/2011  . History of alcohol abuse 05/13/2011  . ANEMIA-IRON DEFICIENCY 03/09/2009  . S/P endoscopy May 2012    4 columns grade II esophageal varices; due for repeat in Nov 2013   . S/P colonoscopy May 2012    Tubular adenoma  . Low back pain   . Cancer     bladder ca 05/2009 removal and  w/chemo wash  . Esophageal varices with bleeding 11/24/11    s/p emergent EGD 11/25/11 by Dr. Rhea Belton at [facility], Grade III esophageal varices s/p banding X 5  . Small bowel obstruction 02/15/2012    Admitted to [facility], managed by Dr. Leticia Penna, ventral hernia manually reduced  . Ventral hernia 02/15/12  . MRSA (methicillin resistant Staphylococcus aureus)    Past Surgical History  Procedure Date  . Carpel tunnel   . Shoulder surgery   . Adrenal mass surgery 05/2009    benign, left  . Bladder  surgery 01/2009 and 06/2010    cancer 2010, small recurrence in 06/2010  . Colonoscopy 04/2009    moderate int hemorrhoids, rare sigmoid diverticula, one mm sessile hyperplastic rectal polyp  . Esophagogastroduodenoscopy 03/2009    small hh  . Small bowel capsule endoscopy 03/2009    couple of small benign appearing erosions, nonbleeding  . Egd/tcs 08/2007    small hiatal hernia, pancolonic diverticula, friable anal canal, 3cm salmon colored epithelium in distal esophagus, bx negative for Barrett's  . Skin cancer excision Oct 2012    left arm  . Esophagogastroduodenoscopy 11/25/2011    Procedure: ESOPHAGOGASTRODUODENOSCOPY (EGD);  Surgeon: Erick Blinks, MD;  Location: [facility] ENDOSCOPY;  Service: Gastroenterology;  Laterality: Left;  . Esophagogastroduodenoscopy 01/14/2012    Dr Rourk->4-5 columns Gr2 varices, 6 bands placed, HH, distal esophageal ulcer, portal gastropathy, antral erosions  . Esophagogastroduodenoscopy 03/17/2012    Procedure: ESOPHAGOGASTRODUODENOSCOPY (EGD);  Surgeon: Corbin Ade, MD;  Location: [facility] ENDO SUITE;  Service: Endoscopy;  Laterality: N/A;  8:30  . Umbilical hernia repair 04/05/2012    Procedure: HERNIA REPAIR UMBILICAL ADULT;  Surgeon: Fabio Bering, MD;  Location: [facility] ORS;  Service: General;  Laterality: N/A;   Prior to Admission medications   Medication Sig Start Date End Date Taking? Authorizing Provider  fish oil-omega-3 fatty acids  1000 MG capsule Take 1 g by mouth 3 (three) times daily.    Yes Historical Provider, MD  furosemide (LASIX) 20 MG tablet Take 20 mg by mouth daily.    Yes Historical Provider, MD  gabapentin (NEURONTIN) 300 MG capsule Take 600-900 mg by mouth 2 (two) times daily. Patient takes 600mg  at about 1700 hours and then takes 900mg  at bedtime   Yes Historical Provider, MD  hydrocodone-ibuprofen (VICOPROFEN) 5-200 MG per tablet Take 1-2 tablets by mouth every 4 (four) hours as needed for pain. 04/05/12 04/15/12 Yes Fabio Bering, MD  insulin aspart  (NOVOLOG) 100 UNIT/ML injection Inject 25-35 Units into the skin 2 (two) times daily. Sliding Scale Checks blood sugar 3 to 4 times daily   Yes Historical Provider, MD  insulin glargine (LANTUS) 100 UNIT/ML injection Inject 60 Units into the skin 2 (two) times daily. Before breakfast & Dinner   Yes Historical Provider, MD  lisinopril (PRINIVIL,ZESTRIL) 20 MG tablet Take 10 mg by mouth daily.    Yes Historical Provider, MD  magnesium oxide (MAG-OX) 400 MG tablet Take 400 mg by mouth daily.   Yes Historical Provider, MD  metFORMIN (GLUCOPHAGE) 1000 MG tablet Take 1,000 mg by mouth 2 (two) times daily with a meal.     Yes Historical Provider, MD  Multiple Vitamin (MULTIVITAMIN) capsule Take 1 capsule by mouth daily.     Yes Historical Provider, MD  naproxen sodium (ALEVE) 220 MG tablet Take 220 mg by mouth 2 (two) times daily as needed. Pain   Yes Historical Provider, MD  omeprazole (PRILOSEC) 40 MG capsule Take 40 mg by mouth daily.   Yes Historical Provider, MD  pramipexole (MIRAPEX) 0.5 MG tablet Take 0.5 mg by mouth at bedtime.    Yes Historical Provider, MD  propranolol (INDERAL) 40 MG tablet Take 40 mg by mouth 2 (two) times daily.   Yes Historical Provider, MD  simvastatin (ZOCOR) 20 MG tablet Take 20 mg by mouth every evening.   Yes Historical Provider, MD  traZODone (DESYREL) 50 MG tablet Take 50 mg by mouth at bedtime.   Yes Historical Provider, MD  mupirocin ointment (BACTROBAN) 2 % Apply 1 application topically 2 (two) times daily.    Historical Provider, MD   Allergies Allergies  Allergen Reactions  . Tylenol (Acetaminophen) Other (See Comments)    Causes legs to "run"    Family History Family History  Problem Relation Age of Onset  . Cirrhosis Father     etoh  . Colon cancer Neg Hx   . Anesthesia problems Neg Hx   . Hypotension Neg Hx   . Malignant hyperthermia Neg Hx   . Pseudochol deficiency Neg Hx   . Kidney cancer Mother   . Cancer Mother   . HIV Brother   .  Cirrhosis Brother     nash   Social History  reports that he has been smoking Cigarettes.  He has a 45 pack-year smoking history. He does not have any smokeless tobacco history on file. He reports that he does not drink alcohol or use illicit drugs.  Review Of Systems:  Unable to obtain due to critical illness  Brief patient description:   56 yo male admitted 5/19 to [facility] for esophageal varecies. EGD was preformed and hemostasis was not able to be achieved. Received multiple blood products. Transfered to [facility] 5/19 for hypovolemic shock and possible TIPS. PCCM admitting   Events Since Admission: 5/19 admitted to [facility] 5/19 EGD  Current Status:  Intubated on levophed   Vital Signs: Temp:  [97.8 F (36.6 C)-98.2 F (36.8 C)] 98.1 F (36.7 C) (05/19 1330) Pulse Rate:  [77-104] 77  (05/19 1330) Resp:  [0-28] 9  (05/19 1330) BP: (63-125)/(44-87) 113/74 mmHg (05/19 1330) SpO2:  [92 %-100 %] 100 % (05/19 1330) FiO2 (%):  [99.8 %-100 %] 100 % (05/19 1345) Weight:  [104.2 kg (229 lb 11.5 oz)-104.327 kg (230 lb)] 104.2 kg (229 lb 11.5 oz) (05/19 0405)  Physical Examination: General: intubated male in no acute distress Neuro: GCS: 10T, no focal deficits  HEENT: ETT Neck: supple, no JVD Cardiovascular: RRR, no m/r/g Lungs: Clear Abdomen: Obese, soft, nontender Musculoskeletal: no edema  Skin: no rash   Active Problems:  DIABETES MELLITUS, TYPE II, CONTROLLED, WITH COMPLICATIONS  SLEEP APNEA, OBSTRUCTIVE, MODERATE  HYPERTENSION  HEPATIC CIRRHOSIS  Other chronic nonalcoholic liver disease  GI BLEEDING  Thrombocytopenia due to hypersplenism  Upper GI bleed  Esophageal varices   ASSESSMENT AND PLAN  PULMONARY  Lab 04/11/12 1345  PHART 7.323*  PCO2ART 45.3*  PO2ART 280.0*  HCO3 22.8  O2SAT 99.5   Ventilator Settings: Vent Mode:  [-]  FiO2 (%):  [99.8 %-100 %] 100 % Set Rate:  [10 bmp-12 bmp] 12 bmp PEEP:  [5 cmH20] 5 cmH20 Plateau Pressure:  [8 cmH20-27 cmH20] 22  cmH20 CXR: Pre-intubation film severely rotated, but with left side airspace disease and possibly right base ETT:  5/19>>>  A: Acute Respiratory failure intubated for airway protection. ? Aspiration with L>R airspace disease  P:   -Continue vent support to 8 cc/kg, abg to follow -daily WUA/PST   -fentanyl drip -PRN versed, no drip wit liver dz -CXR in AM  CARDIOVASCULAR  Lab 04/11/12 1300 04/11/12 0035  TROPONINI -- --  LATICACIDVEN 1.5 4.3*  PROBNP -- --   ECG:  NSR Lines:  R HD Cath (for rapid transfusion) 5/19>>>  A: Hypovolemic shock secondary to upper GI bleed (Esophageal varices) P:  -check CBC STAT -Continue PRBCs as indicated -EGD planned -Monitor electrolytes  -check CVP -Levophed to maintain MAP >65  RENAL  Lab 04/11/12 0715 04/11/12 0051 04/11/12 0021  NA 141 144 139  K 5.0 4.6 --  CL 109 106 102  CO2 26 -- 25  BUN 16 14 14   CREATININE 0.88 0.90 1.01  CALCIUM 8.3* -- 9.9  MG -- -- --  PHOS -- -- --   Intake/Output      05/18 0701 - 05/19 0700 05/19 0701 - 05/20 0700   I.V. (mL/kg) 3873.8 (37.2) 2271.9 (21.8)   Blood 558.3 712.5   IV Piggyback  150   Total Intake(mL/kg) 4432.1 (42.5) 3134.4 (30.1)   Urine (mL/kg/hr) 200 (0.1)    Emesis/NG output 1400    Blood  200   Total Output 1600 200   Net +2832.1 +2934.4        Urine Occurrence  1 x   Stool Occurrence  2 x    Foley: 5/19  A:  Mild hyperkalemia  P:   -monitor BMP --> k was up after prbc, recheck stat   GASTROINTESTINAL  Lab 04/11/12 0715 04/11/12 0021  AST 39* 44*  ALT 28 30  ALKPHOS 55 83  BILITOT 1.4* 0.5  PROT 5.6* 7.2  ALBUMIN 2.4* 2.9*    A:  Upper GI bleed, esophageal varices  P:   -EGD today -per GI -Octreotide drip -Protonix drip  -NPO -may eventually require tips  HEMATOLOGIC  Lab 04/11/12 1300 04/11/12 0715 04/11/12  0113 04/11/12 0051 04/11/12 0021  HGB 9.9* 9.6* 9.8* 10.2* 12.0*  HCT 29.4* 28.8* 30.1* 30.0* 37.0*  PLT 77* 69* -- -- 142*  INR -- --  -- -- 1.25  APTT -- -- -- -- 32   A: Anemia secondary to acute blood loss 5/19 has received 5 uPRBCs P:  -transfuse as needed -monitor electrolytes   INFECTIOUS  Lab 04/11/12 1300 04/11/12 0715 04/11/12 0021  WBC 6.1 3.8* 9.1  PROCALCITON -- -- --   Cultures: Sputum 5/19>>>  Antibiotics: Ceftriaxone 5/19>>>  A:  Possible aspiration  P:   -PCT -monitor CBC -f/u sputum cx pcxr now  ENDOCRINE  Lab 04/11/12 1452 04/05/12 0736  GLUCAP 130* 129*   A: Hx of DM P:   -CBG q 4 hours -Continue SSI  NEUROLOGIC  A: not currently encephalopathic at high risk for delirium  P:   -supportive care  BEST PRACTICE / DISPOSITION - Level of Care: ICU - Primary Service: PCCM - Consultants:  GI - Code Status:  Full - Diet: NPO - DVT Px: SCDs - GI Px:  protonix drip - Skin Integrity: intact - Social / Family: updated at bedside  04/11/2012, 3:16 PM   Ccm 40 min  Mcarthur Rossetti. Tyson Alias, MD, FACP Pgr: [phone] [place]

## 2012-04-11 NOTE — Procedures (Signed)
Central Venous Catheter Insertion Procedure Note Kyle [name] [id number] 18-Dec-1955  Procedure: Insertion of Central Venous Catheter Indications: Assessment of intravascular volume and Drug and/or fluid administration  Procedure Details Consent: Risks of procedure as well as the alternatives and risks of each were explained to the (patient/caregiver).  Consent for procedure obtained. Time Out: Verified patient identification, verified procedure, site/side was marked, verified correct patient position, special equipment/implants available, medications/allergies/relevent history reviewed, required imaging and test results available.  Performed  Maximum sterile technique was used including antiseptics, cap, gloves, gown, hand hygiene, mask and sheet. Skin prep: Chlorhexidine; local anesthetic administered A antimicrobial bonded/coated triple lumen catheter was placed in the right internal jugular vein using the Seldinger technique.  Evaluation Blood flow good Complications: No apparent complications Patient did tolerate procedure well. Chest X-ray ordered to verify placement.  CXR: pending.  BABCOCK,PETE 04/11/2012, 5:34 PM

## 2012-04-12 ENCOUNTER — Encounter (HOSPITAL_COMMUNITY): Payer: Self-pay | Admitting: Internal Medicine

## 2012-04-12 ENCOUNTER — Inpatient Hospital Stay (HOSPITAL_COMMUNITY): Payer: Medicaid Other

## 2012-04-12 DIAGNOSIS — R635 Abnormal weight gain: Secondary | ICD-10-CM

## 2012-04-12 LAB — POCT I-STAT 3, ART BLOOD GAS (G3+)
pCO2 arterial: 47.9 mmHg — ABNORMAL HIGH (ref 35.0–45.0)
pH, Arterial: 7.313 — ABNORMAL LOW (ref 7.350–7.450)
pO2, Arterial: 64 mmHg — ABNORMAL LOW (ref 80.0–100.0)

## 2012-04-12 LAB — CBC
HCT: 30.4 % — ABNORMAL LOW (ref 39.0–52.0)
HCT: 32.5 % — ABNORMAL LOW (ref 39.0–52.0)
Hemoglobin: 9.5 g/dL — ABNORMAL LOW (ref 13.0–17.0)
MCH: 29.7 pg (ref 26.0–34.0)
MCHC: 33 g/dL (ref 30.0–36.0)
MCV: 90.6 fL (ref 78.0–100.0)
Platelets: 64 10*3/uL — ABNORMAL LOW (ref 150–400)
Platelets: 56 10*3/uL — ABNORMAL LOW (ref 150–400)
Platelets: 51 10*3/uL — ABNORMAL LOW (ref 150–400)
RBC: 3.4 MIL/uL — ABNORMAL LOW (ref 4.22–5.81)
RBC: 3.18 MIL/uL — ABNORMAL LOW (ref 4.22–5.81)
RDW: 18.7 % — ABNORMAL HIGH (ref 11.5–15.5)
RDW: 18 % — ABNORMAL HIGH (ref 11.5–15.5)
RDW: 17.8 % — ABNORMAL HIGH (ref 11.5–15.5)
WBC: 6.1 10*3/uL (ref 4.0–10.5)
WBC: 6.9 10*3/uL (ref 4.0–10.5)
WBC: 7.4 10*3/uL (ref 4.0–10.5)
WBC: 5.5 10*3/uL (ref 4.0–10.5)

## 2012-04-12 LAB — BLOOD GAS, ARTERIAL
Drawn by: 24513
MECHVT: 600 mL
TCO2: 26.1 mmol/L (ref 0–100)
pCO2 arterial: 56 mmHg — ABNORMAL HIGH (ref 35.0–45.0)
pH, Arterial: 7.262 — ABNORMAL LOW (ref 7.350–7.450)
pO2, Arterial: 65.9 mmHg — ABNORMAL LOW (ref 80.0–100.0)

## 2012-04-12 LAB — GLUCOSE, CAPILLARY
Glucose-Capillary: 93 mg/dL (ref 70–99)
Glucose-Capillary: 102 mg/dL — ABNORMAL HIGH (ref 70–99)
Glucose-Capillary: 111 mg/dL — ABNORMAL HIGH (ref 70–99)

## 2012-04-12 LAB — PROCALCITONIN: Procalcitonin: <0.1 ng/mL

## 2012-04-12 LAB — HEMOGLOBIN AND HEMATOCRIT, BLOOD
HCT: 32.6 % — ABNORMAL LOW (ref 39.0–52.0)
Hemoglobin: 8 g/dL — ABNORMAL LOW (ref 13.0–17.0)
Hemoglobin: 10.9 g/dL — ABNORMAL LOW (ref 13.0–17.0)

## 2012-04-12 LAB — BASIC METABOLIC PANEL
BUN: 11 mg/dL (ref 6–23)
Calcium: 7.7 mg/dL — ABNORMAL LOW (ref 8.4–10.5)
Chloride: 111 mEq/L (ref 96–112)
Creatinine, Ser: 0.78 mg/dL (ref 0.50–1.35)
GFR calc Af Amer: >90 mL/min (ref >90)

## 2012-04-12 MED ORDER — MAGNESIUM SULFATE 40 MG/ML IJ SOLN
4.0000 g | Freq: Once | INTRAMUSCULAR | Status: AC
  Administered 2012-04-12: 4 g via INTRAVENOUS
  Filled 2012-04-12: qty 100

## 2012-04-12 MED ORDER — PANTOPRAZOLE SODIUM 40 MG IV SOLR
40.0000 mg | INTRAVENOUS | Status: DC
  Administered 2012-04-13: 40 mg via INTRAVENOUS
  Filled 2012-04-12: qty 40

## 2012-04-12 MED ORDER — DEXTROSE 50 % IV SOLN
INTRAVENOUS | Status: AC
  Administered 2012-04-12
  Filled 2012-04-12: qty 50

## 2012-04-12 NOTE — Progress Notes (Signed)
Name: Kyle [name] MRN: [id number] DOB: 11-28-1955    LOS: 1   PULMONARY / CRITICAL CARE MEDICINE  Brief patient description:   56 yo male admitted 5/19 to [facility] for esophageal varecies. EGD was preformed and hemostasis was not able to be achieved. Received multiple blood products. Transfered to [facility] 5/19 for hypovolemic shock and possible TIPS. PCCM admitting   Events Since Admission: 5/19 admitted to [facility] 5/19 EGD ulceration at prior bandsite, injected, hemostais 5/20- hct appropriate, weaning  Vital Signs: Temp:  [98.1 F (36.7 C)-98.9 F (37.2 C)] 98.9 F (37.2 C) (05/20 0757) Pulse Rate:  [73-96] 81  (05/20 0900) Resp:  [0-21] 18  (05/20 0900) BP: (63-146)/(44-111) 103/69 mmHg (05/20 0900) SpO2:  [96 %-100 %] 98 % (05/20 0900) FiO2 (%):  [39.9 %-100 %] 40 % (05/20 0900) Weight:  [108 kg (238 lb 1.6 oz)-108.8 kg (239 lb 13.8 oz)] 108.8 kg (239 lb 13.8 oz) (05/20 0500)  Physical Examination: General: intubated male in no acute distress Neuro: rass 1 HEENT: ETT Neck: supple, line wnl Cardiovascular: RRR, no m/r/g Lungs: Clear rt , slight coarse left Abdomen: Obese, soft, nontender Musculoskeletal: no edema  Skin: no rash   Active Problems:  DIABETES MELLITUS, TYPE II, CONTROLLED, WITH COMPLICATIONS  SLEEP APNEA, OBSTRUCTIVE, MODERATE  HYPERTENSION  HEPATIC CIRRHOSIS  Other chronic nonalcoholic liver disease  Thrombocytopenia due to hypersplenism  Upper GI bleed  Esophageal varices  Acute post-ligation esophageal ulcer with hemorrhage  Acute posthemorrhagic anemia  Acute respiratory failure   ASSESSMENT AND PLAN  PULMONARY  Lab 04/12/12 0405 04/11/12 1345  PHART 7.262* 7.323*  PCO2ART 56.0* 45.3*  PO2ART 65.9* 280.0*  HCO3 24.4* 22.8  O2SAT 92.1 99.5   Ventilator Settings: Vent Mode:  [-] PRVC FiO2 (%):  [39.9 %-100 %] 40 % Set Rate:  [10 bmp-18 bmp] 18 bmp Vt Set:  [600 mL] 600 mL PEEP:  [5 cmH20-5.4 cmH20] 5.4 cmH20 Plateau Pressure:  [8  cmH20-27 cmH20] 26 cmH20 CXR: Pre-intubation film severely rotated, but with left side airspace disease and possibly right base ETT:  5/19>>>  A: Acute Respiratory failure intubated for airway protection. ? Aspiration P:   -abg this am reviewed, may need increase MV -weaning this am cpap 5 ps 5, asssess abg,m reviewed , has great neurostatus, no active bleding clinically -pcxr in am  -sit upright  CARDIOVASCULAR  Lab 04/11/12 1300 04/11/12 0035  TROPONINI -- --  LATICACIDVEN 1.5 4.3*  PROBNP -- --   ECG:  NSR Lines:  R HD Cath (for rapid transfusion) 5/19>>>  A: Hypovolemic shock secondary to upper GI bleed (Esophageal varices) P:  -off pressors -cbc reviewed, stable -MAP goals met  RENAL  Lab 04/12/12 0500 04/11/12 1458 04/11/12 0715 04/11/12 0051 04/11/12 0021  NA 142 141 141 144 139  K 4.3 4.9 -- -- --  CL 111 112 109 106 102  CO2 25 24 26  -- 25  BUN 11 15 16 14 14   CREATININE 0.78 0.82 0.88 0.90 1.01  CALCIUM 7.7* 7.5* 8.3* -- 9.9  MG -- 1.2* -- -- --  PHOS -- 3.6 -- -- --   Intake/Output      05/19 0701 - 05/20 0700 05/20 0701 - 05/21 0700   I.V. (mL/kg) 4555.7 (41.9) 255 (2.3)   Blood 1050    IV Piggyback 150    Total Intake(mL/kg) 5755.7 (52.9) 255 (2.3)   Urine (mL/kg/hr) 2025 (0.8) 225   Emesis/NG output     Blood 200  Total Output 2225 225   Net +3530.7 +30        Urine Occurrence 1 x    Stool Occurrence 2 x     Foley: 5/19  A:  Mild hyperkalemia  P:   -bmet in am -k wnl now    GASTROINTESTINAL  Lab 04/11/12 1458 04/11/12 0715 04/11/12 0021  AST 39* 39* 44*  ALT 29 28 30   ALKPHOS 48 55 83  BILITOT 1.3* 1.4* 0.5  PROT 5.5* 5.6* 7.2  ALBUMIN 2.4* 2.4* 2.9*    A:  Upper GI bleed, esophageal varices  P:   -per GI -npo -hct to q8h consider dc octreotide drip -consider ppi to bid  HEMATOLOGIC  Lab 04/12/12 0636 04/12/12 0530 04/11/12 1458 04/11/12 1300 04/11/12 0715 04/11/12 0021  HGB 8.0* 10.1* 10.7* 9.9* 9.6* --  HCT 24.4*  30.5* 31.9* 29.4* 28.8* --  PLT -- 64* 84* 77* 69* 142*  INR -- -- 1.45 -- -- 1.25  APTT -- -- 30 -- -- 32   A: Anemia secondary to acute blood loss 5/19 has received 5 uPRBCs P:  -transfuse as needed -cbc to q8h Plat trend, limit dilution - kvo  INFECTIOUS  Lab 04/12/12 0530 04/12/12 0500 04/11/12 1458 04/11/12 1300 04/11/12 0715 04/11/12 0021  WBC 6.1 -- 8.5 6.1 3.8* 9.1  PROCALCITON -- <0.10 <0.10 -- -- --   Cultures: Sputum 5/19>>>  Antibiotics: Ceftriaxone 5/19>>>  A:  Possible aspiration, SBP prevention P:   -monitor CBC -f/u sputum cx pcxr now scd  ENDOCRINE  Lab 04/12/12 0756 04/12/12 0331 04/12/12 0051 04/11/12 2358 04/11/12 2356  GLUCAP 102* 93 86 57* 55*   A: Hx of DM P:   -CBG q 4 hours -Continue SSI  NEUROLOGIC  A: not currently encephalopathic at high risk for delirium  P:   -supportive care, no benzo drip, fent , wua  BEST PRACTICE / DISPOSITION - Level of Care: ICU - Primary Service: PCCM - Consultants:  GI - Code Status:  Full - Diet: NPO - DVT Px: SCDs - GI Px:  protonix drip - Skin Integrity: intact - Social / Family: updated at bedside  04/12/2012, 9:29 AM   Ccm 30 min  Mcarthur Rossetti. Tyson Alias, MD, FACP Pgr: [phone] [place] [facility]

## 2012-04-12 NOTE — Care Management Note (Signed)
    Page 1 of 1   04/12/2012     11:35:26 AM   CARE MANAGEMENT NOTE 04/12/2012  Patient:  Kyle [name], Kyle [name]   Account Number:  [id number]  Date Initiated:  04/12/2012  Documentation initiated by:  Junius Creamer  Subjective/Objective Assessment:   adm w hypovolemic shock     Action/Plan:   lives w spouse, pcp dr Orvan July   Anticipated DC Date:     Anticipated DC Plan:        DC Planning Services  CM consult      Choice offered to / List presented to:             Status of service:   Medicare Important Message given?   (If response is "NO", the following Medicare IM given date fields will be blank) Date Medicare IM given:   Date Additional Medicare IM given:    Discharge Disposition:    Per UR Regulation:  Reviewed for med. necessity/level of care/duration of stay  If discussed at Long Length of Stay Meetings, dates discussed:    Comments:  5/20 11:34a debbie Tonie Elsey rn,bsn

## 2012-04-12 NOTE — Progress Notes (Signed)
I have taken an interval history, reviewed the chart and examined the patient. I agree with the extender's note, impression and recommendations.   Kaydan Wong T. Felesha Moncrieffe MD FACG 

## 2012-04-12 NOTE — Procedures (Signed)
Extubation Procedure Note  Patient Details:   Name: Kyle [name] DOB: 12-05-1955 MRN: [id number]   Airway Documentation:  Airway 7 mm (Active)    Evaluation  O2 sats: stable throughout Complications: No apparent complications Patient did tolerate procedure well. Bilateral Breath Sounds: Clear;Diminished Suctioning: Airway Yes  Order received for extubation.  Cuff leak positive prior to extubation.  Placed on 4l [place].  Mild stridor noted post extubation.  No complications noted.  Lysbeth Penner [facility] 04/12/2012, 12:26 PM

## 2012-04-12 NOTE — Progress Notes (Signed)
[facility] Daily Rounding Note 04/12/2012, 10:08 AM  SUBJECTIVE:       No stools.  No nausea.  Getting anxious from being intubated. Really wants ETT removed.   OBJECTIVE:        General: looks well, very alert and animated     Vital signs in last 24 hours:    Temp:  [98.1 F (36.7 C)-98.9 F (37.2 C)] 98.9 F (37.2 C) (05/20 0757) Pulse Rate:  [73-96] 81  (05/20 0900) Resp:  [0-21] 18  (05/20 0900) BP: (63-146)/(44-111) 103/69 mmHg (05/20 0900) SpO2:  [96 %-100 %] 98 % (05/20 0900) FiO2 (%):  [39.7 %-100 %] 39.7 % (05/20 0930) Weight:  [238 lb 1.6 oz (108 kg)-239 lb 13.8 oz (108.8 kg)] 239 lb 13.8 oz (108.8 kg) (05/20 0500) Last BM Date: 04/11/12  Heart: RRR Chest: clear B. Abdomen: soft, obese, NT,  Scar at umbilicus without drainage or bleeding.   Extremities: no pedal edema Neuro/Psych:  Anxious, appropriate.  Not confused.  Moving all 4 limbs.   Intake/Output from previous day: 05/19 0701 - 05/20 0700 In: 5755.7 [I.V.:4555.7; Blood:1050; IV Piggyback:150] Out: 2225 [Urine:2025; Blood:200]  Intake/Output this shift: Total I/O In: 255 [I.V.:255] Out: 225 [Urine:225]  Lab Results:  Basename 04/12/12 0636 04/12/12 0530 04/11/12 1458 04/11/12 1300  WBC -- 6.1 8.5 6.1  HGB 8.0* 10.1* 10.7* --  HCT 24.4* 30.5* 31.9* --  PLT -- 64* 84* 77*   BMET  Basename 04/12/12 0500 04/11/12 1458 04/11/12 0715  NA 142 141 141  K 4.3 4.9 5.0  CL 111 112 109  CO2 25 24 26   GLUCOSE 110* 128* 127*  BUN 11 15 16   CREATININE 0.78 0.82 0.88  CALCIUM 7.7* 7.5* 8.3*   LFT  Basename 04/11/12 1458 04/11/12 0715 04/11/12 0021  PROT 5.5* 5.6* 7.2  ALBUMIN 2.4* 2.4* 2.9*  AST 39* 39* 44*  ALT 29 28 30   ALKPHOS 48 55 83  BILITOT 1.3* 1.4* 0.5  BILIDIR -- -- --  IBILI -- -- --   PT/INR  Basename 04/11/12 1458 04/11/12 0021  LABPROT 17.9* 16.0*  INR 1.45 1.25    Studies/Results: Dg Chest Port 1 View 04/12/2012  *RADIOLOGY REPORT*  Clinical Data: Endotracheal tube   PORTABLE CHEST - 1 VIEW  Comparison: Yesterday  Findings: Stable endotracheal tube and right internal jugular dialysis catheter.  Low volumes and bilateral airspace disease left greater than right improved.  No pneumothorax.  IMPRESSION: Improved bilateral airspace disease.  Original Report Authenticated By: Donavan Burnet, M.D.    ASSESMENT: *  GI bleed. Hemetemesis. S/P EGD with band ligation of esoph varix 03/17/12 by Dr Jena Gauss. Additional EGDs with varix banding 11/25/2011, 01/14/2012 S/P EGD by Dr Karilyn Cota 5/19 for management of recurrent "torrential" hemetemesis: esophageal ulcer with bleeding stigmata, bleeding restarted after placement of hemoclip and banding.  Transferred for consideration of TIPS to [facility] ICU.  S/P EGD by dr Leone Payor 5/20:  Treated distal esophageal ulcer(adherent clot/no active bleeding) at site of previously ligated gastric varix with bicap, applied another band to another varix.  On Protonix drip, octreotide drip, Rocephin in place .  *  ABL anemia.  Hgb down nearly 2 grams in 24 hours. S/p 5 units transusion PRBC yesterday.  *  S/P intubation for airway protection. OSA , previous CPAP but not using recently. Looks like may have had aspiration. *  04/05/12 repair of umbilical hernia.  Dr [name].  *  Thrombocytopenia *  Cirrhosis.  [name]. Up to date on Hep a and B vaccines per review of Dr Luvenia Starch note.   PLAN: *  Does not require TIPS. *  Can stop Protonix drip when it runs out, then will start 40 mg IV q 24 hours.   When he is taking pos need to start carafate for one week, and can change to po Protonix.  *  CBC q 8 hours, just had blood drawn.    LOS: 1 day   Jennye Moccasin  04/12/2012, 10:08 AM Pager: [phone]

## 2012-04-12 NOTE — Progress Notes (Signed)
Pt had one liquid maroon stool (125cc).  VSS.  Jennye Moccasin was notified.  Will recheck CBC at 1800 as previously ordered.

## 2012-04-13 ENCOUNTER — Inpatient Hospital Stay (HOSPITAL_COMMUNITY): Payer: Medicaid Other

## 2012-04-13 LAB — DIFFERENTIAL
Basophils Absolute: 0 10*3/uL (ref 0.0–0.1)
Basophils Relative: 0 % (ref 0–1)
Lymphocytes Relative: 35 % (ref 12–46)
Neutro Abs: 2.4 10*3/uL (ref 1.7–7.7)
Neutrophils Relative %: 54 % (ref 43–77)

## 2012-04-13 LAB — CBC
HCT: 27.5 % — ABNORMAL LOW (ref 39.0–52.0)
HCT: 29.8 % — ABNORMAL LOW (ref 39.0–52.0)
Hemoglobin: 9.9 g/dL — ABNORMAL LOW (ref 13.0–17.0)
MCH: 29.9 pg (ref 26.0–34.0)
MCH: 29.8 pg (ref 26.0–34.0)
MCHC: 33.2 g/dL (ref 30.0–36.0)
MCV: 90.5 fL (ref 78.0–100.0)
MCV: 89.8 fL (ref 78.0–100.0)
Platelets: 48 10*3/uL — ABNORMAL LOW (ref 150–400)
RBC: 3.04 MIL/uL — ABNORMAL LOW (ref 4.22–5.81)
RDW: 17.2 % — ABNORMAL HIGH (ref 11.5–15.5)
WBC: 4.5 10*3/uL (ref 4.0–10.5)

## 2012-04-13 LAB — GLUCOSE, CAPILLARY
Glucose-Capillary: 115 mg/dL — ABNORMAL HIGH (ref 70–99)
Glucose-Capillary: 126 mg/dL — ABNORMAL HIGH (ref 70–99)

## 2012-04-13 LAB — COMPREHENSIVE METABOLIC PANEL
AST: 48 U/L — ABNORMAL HIGH (ref 0–37)
BUN: 10 mg/dL (ref 6–23)
CO2: 26 mEq/L (ref 19–32)
Calcium: 8.2 mg/dL — ABNORMAL LOW (ref 8.4–10.5)
Chloride: 108 mEq/L (ref 96–112)
Creatinine, Ser: 0.85 mg/dL (ref 0.50–1.35)
GFR calc Af Amer: >90 mL/min (ref >90)
GFR calc non Af Amer: >90 mL/min (ref >90)
Total Bilirubin: 1 mg/dL (ref 0.3–1.2)

## 2012-04-13 MED ORDER — GABAPENTIN 600 MG PO TABS
600.0000 mg | ORAL_TABLET | Freq: Every day | ORAL | Status: DC
  Filled 2012-04-13: qty 1

## 2012-04-13 MED ORDER — GABAPENTIN 300 MG PO CAPS: 600.0000 mg | ORAL_CAPSULE | Freq: Two times a day (BID) | ORAL | Status: DC

## 2012-04-13 MED ORDER — GABAPENTIN 600 MG PO TABS
900.0000 mg | ORAL_TABLET | Freq: Every day | ORAL | Status: DC
  Filled 2012-04-13: qty 1.5

## 2012-04-13 MED ORDER — GABAPENTIN 300 MG PO CAPS
900.0000 mg | ORAL_CAPSULE | Freq: Every day | ORAL | Status: DC
  Filled 2012-04-13: qty 3

## 2012-04-13 NOTE — Progress Notes (Signed)
eLink Physician-Brief Progress Note Patient Name: Kyle [name] DOB: 1956/11/15 MRN: [id number]  Date of Service  04/13/2012   HPI/Events of Note     eICU Interventions  Resumed home dose of neurontin for restless legs   Intervention Category Minor Interventions: Routine modifications to care plan (e.g. PRN medications for pain, fever)  Catalia Massett V. 04/13/2012, 4:13 PM

## 2012-04-13 NOTE — Progress Notes (Signed)
Name: Kyle [name] MRN: [id number] DOB: June 08, 1956    LOS: 2   PULMONARY / CRITICAL CARE MEDICINE  Brief patient description:   56 yo male admitted 5/19 to [facility] for esophageal varecies. EGD was preformed and hemostasis was not able to be achieved. Received multiple blood products. Transfered to [facility] 5/19 for hypovolemic shock and possible TIPS. PCCM admitting   Events Since Admission: 5/19 admitted to [facility] 5/19 EGD ulceration at prior bandsite, injected, hemostais 5/20- hct appropriate, weaning 5/20- extubated   Vital Signs: Temp:  [97.8 F (36.6 C)-98.9 F (37.2 C)] 98.5 F (36.9 C) (05/21 0827) Pulse Rate:  [74-116] 74  (05/21 0700) Resp:  [11-27] 16  (05/21 0700) BP: (102-136)/(57-82) 120/72 mmHg (05/21 0700) SpO2:  [93 %-100 %] 98 % (05/21 0700) FiO2 (%):  [39.7 %-40.4 %] 40.1 % (05/20 1200) Weight:  [108.9 kg (240 lb 1.3 oz)] 108.9 kg (240 lb 1.3 oz) (05/21 0400)  Physical Examination: General: no acute distress  Neuro: alert and oriented  HEENT: NAD Neck: supple  Cardiovascular: RRR, no m/r/g Lungs: Clear  Abdomen: Obese, soft, nontender Musculoskeletal: no edema  Skin: no rash   Active Problems:  DIABETES MELLITUS, TYPE II, CONTROLLED, WITH COMPLICATIONS  SLEEP APNEA, OBSTRUCTIVE, MODERATE  HYPERTENSION  HEPATIC CIRRHOSIS  Other chronic nonalcoholic liver disease  Thrombocytopenia due to hypersplenism  Upper GI bleed  Esophageal varices  Acute post-ligation esophageal ulcer with hemorrhage  Acute posthemorrhagic anemia  Acute respiratory failure   ASSESSMENT AND PLAN  PULMONARY  Lab 04/12/12 1104 04/12/12 0405 04/11/12 1345  PHART 7.313* 7.262* 7.323*  PCO2ART 47.9* 56.0* 45.3*  PO2ART 64.0* 65.9* 280.0*  HCO3 24.3* 24.4* 22.8  O2SAT 90.0 92.1 99.5   Ventilator Settings: Vent Mode:  [-] CPAP FiO2 (%):  [39.7 %-40.4 %] 40.1 % PEEP:  [4.7 cmH20-4.9 cmH20] 4.7 cmH20 Pressure Support:  [5 cmH20] 5 cmH20 CXR: improved bilateral airspace  disease   ETT:  5/19>>>5/20  A: Acute Respiratory failure intubated for airway protection. ? Aspiration-- Resolved  P:   -tele, O2 sats   CARDIOVASCULAR  Lab 04/11/12 1300 04/11/12 0035  TROPONINI -- --  LATICACIDVEN 1.5 4.3*  PROBNP -- --   ECG:  NSR Lines:  R HD Cath (for rapid transfusion) 5/19>>>  A: Hypovolemic shock secondary to upper GI bleed (Esophageal varices) P:  -off pressors -cbc stable -d/c CVL today   RENAL  Lab 04/13/12 0238 04/12/12 0500 04/11/12 1458 04/11/12 0715 04/11/12 0051 04/11/12 0021  NA 141 142 141 141 144 --  K 3.9 4.3 -- -- -- --  CL 108 111 112 109 106 --  CO2 26 25 24 26  -- 25  BUN 10 11 15 16 14  --  CREATININE 0.85 0.78 0.82 0.88 0.90 --  CALCIUM 8.2* 7.7* 7.5* 8.3* -- 9.9  MG -- -- 1.2* -- -- --  PHOS -- -- 3.6 -- -- --   Intake/Output      05/20 0701 - 05/21 0700 05/21 0701 - 05/22 0700   I.V. (mL/kg) 1583 (14.5)    Blood     IV Piggyback 50    Total Intake(mL/kg) 1633 (15)    Urine (mL/kg/hr) 2025 (0.8)    Blood     Total Output 2025    Net -392          Foley: 5/19>>>5/21  A:  Mild hyperkalemia -resolved  P:   -monitor bmp in am  kvo  GASTROINTESTINAL  Lab 04/13/12 0238 04/11/12 1458 04/11/12 0715  04/11/12 0021  AST 48* 39* 39* 44*  ALT 38 29 28 30   ALKPHOS 46 48 55 83  BILITOT 1.0 1.3* 1.4* 0.5  PROT 5.5* 5.5* 5.6* 7.2  ALBUMIN 2.5* 2.4* 2.4* 2.9*    A:  Upper GI bleed, esophageal varices  P:   -npo, consider ice chips? -hct to q12h -consider dc octreotide drip -consider ppi to bid  HEMATOLOGIC  Lab 04/13/12 0238 04/12/12 2155 04/12/12 1902 04/12/12 1017 04/12/12 0636 04/12/12 0530 04/11/12 1458 04/11/12 0021  HGB 9.1* 9.5* 10.9*10.8* 10.2* 8.0* -- -- --  HCT 27.5* 28.8* 32.6*32.5* 30.4* 24.4* -- -- --  PLT 48* 51* 56* 62* -- 64* -- --  INR -- -- -- -- -- -- 1.45 1.25  APTT -- -- -- -- -- -- 30 32   A: Anemia secondary to acute blood loss 5/19 has received 5 uPRBCs P:  -transfuse as  needed -cbc to q12h  INFECTIOUS  Lab 04/13/12 0238 04/12/12 2155 04/12/12 1902 04/12/12 1017 04/12/12 0530 04/12/12 0500 04/11/12 1458  WBC 4.5 5.5 7.4 6.9 6.1 -- --  PROCALCITON -- -- -- -- -- <0.10 <0.10   Cultures: Sputum 5/19>>>  Antibiotics: Ceftriaxone 5/19>>>add stop date total 5 days  A:  Possible aspiration, SBP prevention P:   -f/u sputum cx pcxr ATX mild, IS scd  ENDOCRINE  Lab 04/13/12 0825 04/13/12 0352 04/12/12 2357 04/12/12 1959 04/12/12 1558  GLUCAP 117* 132* 115* 111* 125*   A: Hx of DM P:   -CBG q 4 hours -Continue SSI  NEUROLOGIC  A: not currently encephalopathic at high risk for delirium  P:   -supportive care, no benzo Upright on own successful  BEST PRACTICE / DISPOSITION - Level of Care: to sdu - Primary Service: PCCM >>>triad  - Consultants:  GI - Code Status:  Full - Diet: NPO - DVT Px: SCDs - GI Px:  protonix drip - Skin Integrity: intact - Social / Family: updated at bedside  04/13/2012, 9:05 AM  Mcarthur Rossetti. Tyson Alias, MD, FACP Pgr: [phone] [facility]\

## 2012-04-13 NOTE — Progress Notes (Signed)
Kyle [name] Daily Rounding Note 04/13/2012, 8:35 AM  SUBJECTIVE:     Extubated noon yesterday.   Maroon stool, 125 cc, yesterday at 4 pm. Another this AM.  Walking to bathroom with assistance.  Octreotide drip in place Denies dizzyness or nausea.  Hungry.  Still NPO  OBJECTIVE:        General: Looks tired but not acutely ill     Vital signs in last 24 hours:    Temp:  [97.8 F (36.6 C)-98.9 F (37.2 C)] 98.5 F (36.9 C) (05/21 0827) Pulse Rate:  [74-116] 74  (05/21 0700) Resp:  [11-27] 16  (05/21 0700) BP: (102-136)/(57-82) 120/72 mmHg (05/21 0700) SpO2:  [93 %-100 %] 98 % (05/21 0700) FiO2 (%):  [39.7 %-40.4 %] 40.1 % (05/20 1200) Weight:  [240 lb 1.3 oz (108.9 kg)] 240 lb 1.3 oz (108.9 kg) (05/21 0400) Last BM Date: 04/12/12  Heart: RRR.  Not tachy Chest: clear B.  A bit dyspneic. Abdomen: soft, obese, no disharge or leaking from umbilical hernia repair site  Extremities: minor , non-pitting pedal edema Neuro/Psych:  Pleasant,  Fully oriented and appropriate.  Intake/Output from previous day: 05/20 0701 - 05/21 0700 In: 1633 [I.V.:1583; IV Piggyback:50] Out: 2025 [Urine:2025]  Intake/Output this shift:    Lab Results:  Basename 04/13/12 0238 04/12/12 2155 04/12/12 1902  WBC 4.5 5.5 7.4  HGB 9.1* 9.5* 10.9*10.8*  HCT 27.5* 28.8* 32.6*32.5*  PLT 48* 51* 56*   BMET  Basename 04/13/12 0238 04/12/12 0500 04/11/12 1458  NA 141 142 141  K 3.9 4.3 4.9  CL 108 111 112  CO2 26 25 24   GLUCOSE 125* 110* 128*  BUN 10 11 15   CREATININE 0.85 0.78 0.82  CALCIUM 8.2* 7.7* 7.5*   LFT  Basename 04/13/12 0238 04/11/12 1458 04/11/12 0715  PROT 5.5* 5.5* 5.6*  ALBUMIN 2.5* 2.4* 2.4*  AST 48* 39* 39*  ALT 38 29 28  ALKPHOS 46 48 55  BILITOT 1.0 1.3* 1.4*  BILIDIR -- -- --  IBILI -- -- --   PT/INR  Basename 04/11/12 1458 04/11/12 0021  LABPROT 17.9* 16.0*  INR 1.45 1.25   Studies/Results: Dg Chest Port 1 View 04/13/2012  *RADIOLOGY REPORT*  Clinical Data:  Evaluate left-sided pulmonary infiltrates  PORTABLE CHEST - 1 VIEW  Comparison: 04/12/2012; 04/11/2012; 11/25/2011  Findings: Grossly unchanged cardiac silhouette and mediastinal contours given persistently reduced lung volumes.  Interval extubation.  Otherwise, stable positioning of support apparatus. The minimally improved aeration of the left mid and lower lung with persistent linear heterogeneous opacities.  Right hemithorax is unchanged. No definite evidence of pulmonary edema.  No definite pleural effusion.  Grossly unchanged bones.  IMPRESSION: 1.  Interval extubation.  No pneumothorax. 2.  Minimally improved aeration of the left mid and lower lung with persistent opacities, likely atelectasis.  Original Report Authenticated By: Kyle [name], M.D.    ASSESMENT: * [name] bleed. Hemetemesis.  S/P EGD with band ligation of esoph varix 03/17/12 by Dr Kyle [name]. Additional EGDs with varix banding 11/25/2011, 01/14/2012  S/P EGD by Dr Kyle [name] 5/19 for management of recurrent "torrential" hemetemesis: esophageal ulcer with bleeding stigmata, bleeding restarted after placement of hemoclip and banding. Transferred for consideration of TIPS to [facility] ICU.  S/P EGD by dr Kyle [name] 5/20: Treated distal esophageal ulcer(adherent clot/no active bleeding) at site of previously ligated gastric varix with bicap, applied another band to another varix.  On Protonix IV q 24 hour, octreotide drip, Rocephin in  place .  * ABL anemia. Hgb down nearly 2 grams in 8 hours. S/p 5 units transusion PRBC yesterday.  * S/P intubation for airway protection. OSA , previous CPAP but not using recently. Looks like may have had aspiration.  * 04/05/12 repair of umbilical hernia. Dr Kyle [name].  * Thrombocytopenia  * Cirrhosis. [name]. Up to date on Hep a and B vaccines per review of Dr Kyle [name] note.      PLAN: Allow sips of clears.  Follow CBC, may need another EGD if hgb continues to decline.    LOS: 2 days   Kyle [name]  04/13/2012,  8:35 AM Pager: [phone]   I have personally taken an interval history, reviewed the chart, and examined the patient.  I agree with the extender's note, impression and recommendations.

## 2012-04-13 NOTE — Progress Notes (Signed)
Pt wants to leave AMA.  Call placed to Dr Vassie Loll in black box; spoke with Maralyn Sago who relay to Dr Vassie Loll.  IVs DC'd and pt left hospital with wife.

## 2012-04-14 ENCOUNTER — Encounter: Payer: Self-pay | Admitting: Internal Medicine

## 2012-04-14 ENCOUNTER — Ambulatory Visit (INDEPENDENT_AMBULATORY_CARE_PROVIDER_SITE_OTHER): Payer: Medicaid Other | Admitting: Gastroenterology

## 2012-04-14 VITALS — BP 115/74 | HR 81 | Temp 98.9°F | Ht 67.0 in | Wt 234.8 lb

## 2012-04-14 DIAGNOSIS — I8500 Esophageal varices without bleeding: Secondary | ICD-10-CM

## 2012-04-14 DIAGNOSIS — K7460 Unspecified cirrhosis of liver: Secondary | ICD-10-CM

## 2012-04-14 DIAGNOSIS — K922 Gastrointestinal hemorrhage, unspecified: Secondary | ICD-10-CM

## 2012-04-14 LAB — GLUCOSE, CAPILLARY: Glucose-Capillary: 110 mg/dL — ABNORMAL HIGH (ref 70–99)

## 2012-04-14 LAB — CULTURE, RESPIRATORY W GRAM STAIN

## 2012-04-14 MED ORDER — OXYCODONE HCL 5 MG PO TABS: 5.0000 mg | ORAL_TABLET | Freq: Three times a day (TID) | ORAL | Status: AC | PRN

## 2012-04-14 MED ORDER — CIPROFLOXACIN HCL 500 MG PO TABS: 500.0000 mg | ORAL_TABLET | Freq: Two times a day (BID) | ORAL | Status: AC

## 2012-04-14 NOTE — Progress Notes (Signed)
Primary Care Physician: Redmond Baseman, MD, MD  Primary Gastroenterologist:  Roetta Sessions, MD   Chief Complaint  Patient presents with  . Follow-up    HPI: Kyle [name] is a 56 y.o. male here to discuss plan of care. Patient was hospitalized 5/19 - 04/13/12. Presented to [facility] ED with massive UGI bleed. EGD by Dr. Karilyn Cota on 5/19. 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 Application of Hemoclip not successful and resulted in active bleeding. Single band applied distal to the ulcer but not successful. Patient intubated. Transferred to [facility] for consideration of TIPS. Seen by Dr. Leone Payor, EGD 5/20. Bicap to distal esophageal ulcer, applied another band to another varix. Treated with octreotide and Rocephin. 5 units of prbcs total per patient. Patient states he left AMA yesterday after being told he would going to be transferred to [facility] but states after eight hours he remained in his ICU room without any monitors connected and no assistance by staff.   Patient states he wants to know what his plan of care will be. ?needs TIPS or referral for transplant as suggested while at [facility]. Patient has never required LVAP. 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 He had EGD with banding on 11/2011 for active bleeding, EGD 12/2011, 02/2012 as well.   04/05/12 repair of umbilical hernia Dr. Suzette Battiest. Given Ibuprofen/hydrocodone for post-op pain.    Last BM today. No blood or melena. No c/o SOB, dizziness. No abd pain. Minimal lower ext edema.   Current Outpatient Prescriptions  Medication Sig Dispense Refill  . fish oil-omega-3 fatty acids 1000 MG capsule Take 1 g by mouth 3 (three) times daily.       . furosemide (LASIX) 20 MG tablet Take 20 mg by mouth daily.       [name] gabapentin (NEURONTIN) 300 MG capsule Take 600-900 mg by mouth 2 (two) times daily. Patient takes 600mg  at about 1700 hours and then takes 900mg  at  bedtime      . hydrocodone-ibuprofen (VICOPROFEN) 5-200 MG per tablet Take 1-2 tablets by mouth every 4 (four) hours as needed for pain.  45 tablet  0  . insulin aspart (NOVOLOG) 100 UNIT/ML injection Inject 25-35 Units into the skin 2 (two) times daily. Sliding Scale Checks blood sugar 3 to 4 times daily      . insulin glargine (LANTUS) 100 UNIT/ML injection Inject 60 Units into the skin 2 (two) times daily. Before breakfast & Dinner      . lisinopril (PRINIVIL,ZESTRIL) 20 MG tablet Take 10 mg by mouth daily.       . magnesium oxide (MAG-OX) 400 MG tablet Take 400 mg by mouth daily.      . metFORMIN (GLUCOPHAGE) 1000 MG tablet Take 1,000 mg by mouth 2 (two) times daily with a meal.        . Multiple Vitamin (MULTIVITAMIN) capsule Take 1 capsule by mouth daily.        . mupirocin ointment (BACTROBAN) 2 % Apply 1 application topically 2 (two) times daily.      . naproxen sodium (ALEVE) 220 MG tablet Take 220 mg by mouth 2 (two) times daily as needed. Pain      . omeprazole (PRILOSEC) 40 MG capsule Take 40 mg by mouth daily.      . pramipexole (MIRAPEX) 0.5 MG tablet Take 0.5 mg by mouth at bedtime.       . propranolol (INDERAL) 40 MG tablet  Take 40 mg by mouth 2 (two) times daily.      . simvastatin (ZOCOR) 20 MG tablet Take 20 mg by mouth every evening.      . traZODone (DESYREL) 50 MG tablet Take 50 mg by mouth at bedtime.       No current facility-administered medications for this visit.   Facility-Administered Medications Ordered in Other Visits  Medication Dose Route Frequency Provider Last Rate Last Dose  . DISCONTD: 0.9 %  sodium chloride infusion  250 mL Intravenous PRN Simonne Martinet, NP      . DISCONTD: 0.9 %  sodium chloride infusion   Intravenous Continuous Nelda Bucks, MD 75 mL/hr at 04/11/12 1853 500 mL at 04/11/12 1853  . DISCONTD: antiseptic oral rinse (BIOTENE) solution 15 mL  15 mL Mouth Rinse QID Nelda Bucks, MD   15 mL at 04/13/12 1230  . DISCONTD:  chlorhexidine (PERIDEX) 0.12 % solution 15 mL  15 mL Mouth Rinse BID Nelda Bucks, MD   15 mL at 04/13/12 0800  . DISCONTD: gabapentin (NEURONTIN) capsule 600-900 mg  600-900 mg Oral BID Oretha Milch, MD      . DISCONTD: gabapentin (NEURONTIN) capsule 900 mg  900 mg Oral QHS Nelda Bucks, MD      . DISCONTD: gabapentin (NEURONTIN) tablet 600 mg  600 mg Oral QAC supper Nelda Bucks, MD      . DISCONTD: gabapentin (NEURONTIN) tablet 900 mg  900 mg Oral QHS Nelda Bucks, MD      . DISCONTD: insulin aspart (novoLOG) injection 0-9 Units  0-9 Units Subcutaneous Q4H Vania Rea, MD   2 Units at 04/13/12 1230  . DISCONTD: ondansetron (ZOFRAN) injection 4 mg  4 mg Intravenous Q4H PRN Vania Rea, MD      . DISCONTD: pantoprazole (PROTONIX) injection 40 mg  40 mg Intravenous Q24H Dianah Field, PA   40 mg at 04/13/12 1200    Allergies as of 04/14/2012 - Review Complete 04/14/2012  Allergen Reaction Noted  . Tylenol (acetaminophen) Other (See Comments) 07/17/2011    ROS:  General: Negative for anorexia, weight loss, fever, chills, fatigue, weakness. ENT: Negative for hoarseness, difficulty swallowing , nasal congestion. CV: Negative for chest pain, angina, palpitations, dyspnea on exertion, peripheral edema.  Respiratory: Negative for dyspnea at rest, dyspnea on exertion,  sputum, wheezing. Slight cough since intubation. GI: See history of present illness. GU:  Negative for dysuria, hematuria, urinary incontinence, urinary frequency, nocturnal urination.  Endo: Negative for unusual weight change.    Physical Examination:   BP 115/74  Pulse 81  Temp(Src) 98.9 F (37.2 C) (Temporal)  Ht 5\' 7"  (1.702 m)  Wt 234 lb 12.8 oz (106.505 kg)  BMI 36.78 kg/m2  General: Well-nourished, well-developed in no acute distress. Accompanied by wife. Eyes: No icterus. Mouth: Oropharyngeal mucosa moist and pink , no lesions erythema or exudate. Lungs: Clear to auscultation  bilaterally.  Heart: Regular rate and rhythm, no murmurs rubs or gallops. Site of jugular venous access looks good. No drainage or bleeding.  Abdomen: Bowel sounds are normal, nontender, nondistended, no hepatosplenomegaly or masses, no abdominal bruits, no rebound or guarding.  Umbilicus with bandages and no drainage. S/p recent hernia repair.  Extremities: 1+lower extremity edema. No clubbing or deformities. Neuro: Alert and oriented x 4   Skin: Warm and dry, no jaundice.   Psych: Alert and cooperative, normal mood and affect.  Labs:  Lab Results  Component Value Date  WBC 5.0 04/13/2012   HGB 9.9* 04/13/2012   HCT 29.8* 04/13/2012   MCV 89.8 04/13/2012   PLT 58* 04/13/2012   Lab Results  Component Value Date   CREATININE 0.85 04/13/2012   BUN 10 04/13/2012   NA 141 04/13/2012   K 3.9 04/13/2012   CL 108 04/13/2012   CO2 26 04/13/2012   Lab Results  Component Value Date   ALT 38 04/13/2012   AST 48* 04/13/2012   ALKPHOS 46 04/13/2012   BILITOT 1.0 04/13/2012   Lab Results  Component Value Date   INR 1.45 04/11/2012   INR 1.25 04/11/2012   INR 1.27 02/15/2012

## 2012-04-14 NOTE — Assessment & Plan Note (Signed)
Recent massive UGI bleed which began evening of 04/11/12. Bleeding from ulcerated area in esophagus likely from prior banding. EGD X 2 and 5 units of prbcs as outlined above. Question of TIPS has been brought up given recent massive bleeding. Patient had EV bleed in 11/2011. Six EGDs this year with banding. Currently without active bleeding although I discussed at length with patient that leaving AMA was not ideal given such a bleed. I will discuss with Dr. Jena Gauss regarding opinion about TIPS. Will go ahead and cover patient few more days with Cipro given GI bleed in cirrhosis for prophylaxis against infection. Patient currently has a MELD of 9. Has never been seen in tertiary care facility/liver transplant center. May be a good idea to establish care in the future. Patient is overdue for AFP tumor marker. Will order after above discussed with Dr. Jena Gauss.

## 2012-04-14 NOTE — Progress Notes (Signed)
Faxed to PCP

## 2012-04-15 LAB — TYPE AND SCREEN
ABO/RH(D): O POS
Unit division: 0
Unit division: 0

## 2012-04-20 ENCOUNTER — Telehealth: Payer: Self-pay | Admitting: Internal Medicine

## 2012-04-20 NOTE — Progress Notes (Signed)
Please let patient know I discussed case with Dr. Jena Gauss.  1.He recommends no TIPS right now. 2.EGD in 5 weeks. Please schedule. We can triage again close to procedure date. 3.Increase his propranolol to 40mg  TID. To get his pulse down more and hopefully prevent further bleeding. 4.He needs to come in for nurse visit to check BP and pulse in two weeks. He needs to make sure he is taking his propranolol the day he comes in. If he has BP cuff at home, he should check BP and pulse at home after on new dose a few days.

## 2012-04-20 NOTE — Telephone Encounter (Signed)
Spoke with pt- he will need new rx for increase in  propranolol 40mg  to tid sent to [facility]

## 2012-04-20 NOTE — Telephone Encounter (Signed)
Pt called for RMR nurse. You can reach him at [phone]

## 2012-04-20 NOTE — Progress Notes (Signed)
Tried to call pt- LMOM 

## 2012-04-20 NOTE — Progress Notes (Signed)
Pt is aware.  

## 2012-04-21 ENCOUNTER — Other Ambulatory Visit: Payer: Self-pay | Admitting: Gastroenterology

## 2012-04-21 DIAGNOSIS — K922 Gastrointestinal hemorrhage, unspecified: Secondary | ICD-10-CM

## 2012-04-21 MED ORDER — PROPRANOLOL HCL 40 MG PO TABS: 40.0000 mg | ORAL_TABLET | Freq: Three times a day (TID) | ORAL | Status: DC

## 2012-04-21 NOTE — Telephone Encounter (Signed)
done

## 2012-04-21 NOTE — Telephone Encounter (Signed)
Addended by: Tiffany Kocher on: 04/21/2012 12:12 PM   Modules accepted: Orders

## 2012-04-23 NOTE — Discharge Summary (Signed)
Physician Discharge Summary     Patient ID: Kyle [name] MRN: [id number] DOB/AGE: 55-Oct-1957 55 y.o.  Admit date: 04/11/2012 Discharge date: 04/23/2012  Admission Diagnoses:  Discharge Diagnoses:  Active Problems:  DIABETES MELLITUS, TYPE II, CONTROLLED, WITH COMPLICATIONS  SLEEP APNEA, OBSTRUCTIVE, MODERATE  HYPERTENSION  HEPATIC CIRRHOSIS  Other chronic nonalcoholic liver disease  Thrombocytopenia due to hypersplenism  Upper GI bleed  Esophageal varices  Acute post-ligation esophageal ulcer with hemorrhage  Acute posthemorrhagic anemia  Acute respiratory failure  Brief patient description:  56 yo male admitted 5/19 to [facility] for esophageal varecies. EGD was preformed and hemostasis was not able to be achieved. Received multiple blood products. Transfered to [facility] 5/19 for hypovolemic shock and possible TIPS. PCCM admitting   Events Since Admission:  5/19 admitted to [facility]  5/19 EGD ulceration at prior bandsite, injected, hemostais  5/20- hct appropriate, weaning  5/20- extubated   :Acute Respiratory failure intubated for airway protection. ? Aspiration-- Resolved  P:  -tele, O2 sats  CARDIOVASCULAR   Lab  04/11/12 1300  04/11/12 0035   TROPONINI  --  --   LATICACIDVEN  1.5  4.3*   PROBNP  --  --    ECG: NSR  Lines:  R HD Cath (for rapid transfusion) 5/19>>>  A: Hypovolemic shock secondary to upper GI bleed (Esophageal varices) Resolved w/ aggressive blood IV resuscitation  A: Upper GI bleed, esophageal varices  P:  Pt signed out AMA   A: Anemia secondary to acute blood loss 5/19 has received 5 uPRBCs  P: f/u per pt, he signed out AMA  A: Possible aspiration, SBP prevention  P:  We were unable to f/u as pt s/o [facility]  ENDOCRINE   Lab  04/13/12 0825  04/13/12 0352  04/12/12 2357  04/12/12 1959  04/12/12 1558   GLUCAP  117*  132*  115*  111*  125*    A: Hx of DM  P:  S/o AMA   Discharge Exam: Discharged against medical advice  Labs at discharge Lab  Results  Component Value Date   CREATININE 0.85 04/13/2012   BUN 10 04/13/2012   NA 141 04/13/2012   K 3.9 04/13/2012   CL 108 04/13/2012   CO2 26 04/13/2012   Lab Results  Component Value Date   WBC 5.0 04/13/2012   HGB 9.9* 04/13/2012   HCT 29.8* 04/13/2012   MCV 89.8 04/13/2012   PLT 58* 04/13/2012   Lab Results  Component Value Date   ALT 38 04/13/2012   AST 48* 04/13/2012   ALKPHOS 46 04/13/2012   BILITOT 1.0 04/13/2012   Lab Results  Component Value Date   INR 1.45 04/11/2012   INR 1.25 04/11/2012   INR 1.27 02/15/2012    Current radiology studies No results found.  Disposition:  07-Left Against Medical Advice  Discharge Orders    Future Appointments: Provider: Department: Dept Phone: Center:   04/30/2012 10:20 AM [facility] [phone] None   05/07/2012 11:30 AM Ellouise Newer, [facility] [phone] None     Medication List NOT REVIEWED AS PT left AMA As of 04/23/2012  9:27 AM   ASK your doctor about these medications         ALEVE 220 MG tablet   Generic drug: naproxen sodium   Take 220 mg by mouth 2 (two) times daily as needed. Pain      fish oil-omega-3 fatty acids 1000 MG capsule   Take 1 g by mouth  3 (three) times daily.      furosemide 20 MG tablet   Commonly known as: LASIX   Take 20 mg by mouth daily.      gabapentin 300 MG capsule   Commonly known as: NEURONTIN   Take 600-900 mg by mouth 2 (two) times daily. Patient takes 600mg  at about 1700 hours and then takes 900mg  at bedtime      hydrocodone-ibuprofen 5-200 MG per tablet   Commonly known as: VICOPROFEN   Take 1-2 tablets by mouth every 4 (four) hours as needed for pain.      insulin aspart 100 UNIT/ML injection   Commonly known as: novoLOG   Inject 25-35 Units into the skin 2 (two) times daily. Sliding Scale  Checks blood sugar 3 to 4 times daily      insulin glargine 100 UNIT/ML injection   Commonly known as: LANTUS   Inject 60 Units into the skin 2 (two) times  daily. Before breakfast & Dinner      lisinopril 20 MG tablet   Commonly known as: PRINIVIL,ZESTRIL   Take 10 mg by mouth daily.      magnesium oxide 400 MG tablet   Commonly known as: MAG-OX   Take 400 mg by mouth daily.      metFORMIN 1000 MG tablet   Commonly known as: GLUCOPHAGE   Take 1,000 mg by mouth 2 (two) times daily with a meal.      multivitamin capsule   Take 1 capsule by mouth daily.      mupirocin ointment 2 %   Commonly known as: BACTROBAN   Apply 1 application topically 2 (two) times daily.      omeprazole 40 MG capsule   Commonly known as: PRILOSEC   Take 40 mg by mouth daily.      pramipexole 0.5 MG tablet   Commonly known as: MIRAPEX   Take 0.5 mg by mouth at bedtime.      simvastatin 20 MG tablet   Commonly known as: ZOCOR   Take 20 mg by mouth every evening.      traZODone 50 MG tablet   Commonly known as: DESYREL   Take 50 mg by mouth at bedtime.             Discharged Condition: signed out AMA    Signed: Aimie Wagman,PETE 04/23/2012, 9:27 AM

## 2012-04-24 NOTE — Discharge Summary (Signed)
Pt needed to stay further for therapy. RIsk death explained Left AMA Mcarthur Rossetti. Tyson Alias, MD, FACP Pgr: [phone] [facility]

## 2012-04-27 NOTE — Progress Notes (Signed)
Called, no answer.

## 2012-04-27 NOTE — Progress Notes (Signed)
Pt called back and was in formed

## 2012-04-27 NOTE — Progress Notes (Unsigned)
Patient ID: Kyle [name], male   DOB: Jul 28, 1956, 56 y.o.   MRN: [id number]  Continue current dose of propranolol. Have patient come in again in two weeks with BP, pulse check. He has only been on new dose for one week. Before increasing we need to check one more time.   Make sure patient has rested for five minutes before taking BP and pulse.

## 2012-04-27 NOTE — Progress Notes (Unsigned)
Pt came to office for bp check. His bp was 123/74. Pulse 75. He wanted to check his weight and it was 227lbs.

## 2012-04-30 ENCOUNTER — Encounter (HOSPITAL_COMMUNITY): Payer: Medicaid Other | Attending: Oncology

## 2012-04-30 DIAGNOSIS — I8500 Esophageal varices without bleeding: Secondary | ICD-10-CM | POA: Insufficient documentation

## 2012-04-30 DIAGNOSIS — D649 Anemia, unspecified: Secondary | ICD-10-CM | POA: Insufficient documentation

## 2012-04-30 DIAGNOSIS — D6959 Other secondary thrombocytopenia: Secondary | ICD-10-CM | POA: Insufficient documentation

## 2012-04-30 DIAGNOSIS — D509 Iron deficiency anemia, unspecified: Secondary | ICD-10-CM

## 2012-04-30 DIAGNOSIS — K922 Gastrointestinal hemorrhage, unspecified: Secondary | ICD-10-CM | POA: Insufficient documentation

## 2012-04-30 LAB — CBC
MCV: 93.2 fL (ref 78.0–100.0)
Platelets: 100 10*3/uL — ABNORMAL LOW (ref 150–400)
RDW: 16.3 % — ABNORMAL HIGH (ref 11.5–15.5)
WBC: 3.3 10*3/uL — ABNORMAL LOW (ref 4.0–10.5)

## 2012-04-30 LAB — COMPREHENSIVE METABOLIC PANEL
ALT: 39 U/L (ref 0–53)
AST: 53 U/L — ABNORMAL HIGH (ref 0–37)
CO2: 22 mEq/L (ref 19–32)
Calcium: 9.7 mg/dL (ref 8.4–10.5)
GFR calc non Af Amer: >90 mL/min (ref >90)
Potassium: 4.2 mEq/L (ref 3.5–5.1)
Sodium: 136 mEq/L (ref 135–145)
Total Protein: 7.6 g/dL (ref 6.0–8.3)

## 2012-04-30 LAB — DIFFERENTIAL
Basophils Absolute: 0 10*3/uL (ref 0.0–0.1)
Eosinophils Relative: 3 % (ref 0–5)
Lymphocytes Relative: 33 % (ref 12–46)
Neutrophils Relative %: 56 % (ref 43–77)

## 2012-04-30 NOTE — Progress Notes (Signed)
Kyle [name] presented for labwork. Labs per MD order drawn via Peripheral Line 23 gauge needle inserted in right wrist.  Good blood return present. Procedure without incident.  Needle removed intact. Patient tolerated procedure well.

## 2012-05-07 ENCOUNTER — Ambulatory Visit (HOSPITAL_COMMUNITY): Payer: Medicaid Other

## 2012-05-10 ENCOUNTER — Encounter (HOSPITAL_BASED_OUTPATIENT_CLINIC_OR_DEPARTMENT_OTHER): Payer: Medicaid Other

## 2012-05-10 ENCOUNTER — Other Ambulatory Visit (HOSPITAL_COMMUNITY): Payer: Self-pay | Admitting: Oncology

## 2012-05-10 VITALS — BP 115/69 | HR 78 | Wt 228.0 lb

## 2012-05-10 DIAGNOSIS — D509 Iron deficiency anemia, unspecified: Secondary | ICD-10-CM

## 2012-05-10 MED ORDER — SODIUM CHLORIDE 0.9 % IJ SOLN
10.0000 mL | INTRAMUSCULAR | Status: DC | PRN
  Administered 2012-05-10: 10 mL
  Filled 2012-05-10: qty 10

## 2012-05-10 MED ORDER — SODIUM CHLORIDE 0.9 % IV SOLN
Freq: Once | INTRAVENOUS | Status: AC
  Administered 2012-05-10: 12:00:00 via INTRAVENOUS

## 2012-05-10 MED ORDER — SODIUM CHLORIDE 0.9 % IV SOLN
1020.0000 mg | Freq: Once | INTRAVENOUS | Status: AC
  Administered 2012-05-10: 1020 mg via INTRAVENOUS
  Filled 2012-05-10: qty 34

## 2012-05-10 MED ORDER — SODIUM CHLORIDE 0.9 % IJ SOLN
INTRAMUSCULAR | Status: AC
  Filled 2012-05-10: qty 10

## 2012-05-10 NOTE — Progress Notes (Signed)
Tolerated  Infusion well. 

## 2012-05-11 ENCOUNTER — Ambulatory Visit (INDEPENDENT_AMBULATORY_CARE_PROVIDER_SITE_OTHER): Payer: Medicaid Other | Admitting: Gastroenterology

## 2012-05-11 VITALS — BP 115/76 | HR 77 | Wt 232.0 lb

## 2012-05-11 DIAGNOSIS — K7460 Unspecified cirrhosis of liver: Secondary | ICD-10-CM

## 2012-05-11 NOTE — Telephone Encounter (Signed)
Opened in error

## 2012-05-13 ENCOUNTER — Encounter (HOSPITAL_COMMUNITY): Payer: Self-pay | Admitting: Pharmacist

## 2012-05-18 ENCOUNTER — Encounter (HOSPITAL_BASED_OUTPATIENT_CLINIC_OR_DEPARTMENT_OTHER): Payer: Medicaid Other | Admitting: Oncology

## 2012-05-18 VITALS — BP 114/70 | HR 78 | Temp 98.1°F | Ht 67.0 in | Wt 230.6 lb

## 2012-05-18 DIAGNOSIS — D649 Anemia, unspecified: Secondary | ICD-10-CM

## 2012-05-18 DIAGNOSIS — K7689 Other specified diseases of liver: Secondary | ICD-10-CM

## 2012-05-18 DIAGNOSIS — D6489 Other specified anemias: Secondary | ICD-10-CM

## 2012-05-18 DIAGNOSIS — D6959 Other secondary thrombocytopenia: Secondary | ICD-10-CM

## 2012-05-18 DIAGNOSIS — D509 Iron deficiency anemia, unspecified: Secondary | ICD-10-CM

## 2012-05-18 DIAGNOSIS — I8500 Esophageal varices without bleeding: Secondary | ICD-10-CM

## 2012-05-18 DIAGNOSIS — K7460 Unspecified cirrhosis of liver: Secondary | ICD-10-CM

## 2012-05-18 DIAGNOSIS — K922 Gastrointestinal hemorrhage, unspecified: Secondary | ICD-10-CM

## 2012-05-18 NOTE — Patient Instructions (Signed)
[facility]  Discharge Instructions  RECOMMENDATIONS MADE BY THE CONSULTANT AND ANY TEST RESULTS WILL BE SENT TO YOUR REFERRING DOCTOR.    Lab work every 6 weeks.  Return to clinic to see MD in 3 months. We will make referral to Dr.Edgewood, you need to have a primary medical doctor. Call clinic with any issues or concerns prior to appointments if necessary.     I acknowledge that I have been informed and understand all the instructions given to me and received a copy. I do not have any more questions at this time, but understand that I may call the Specialty Clinic at [facility] at [phone] during business hours should I have any further questions or need assistance in obtaining follow-up care.    __________________________________________  _____________  __________ Signature of Patient or Authorized Representative            Date                   Time    __________________________________________ Nurse's Signature

## 2012-05-18 NOTE — Progress Notes (Signed)
Kyle Baseman, MD [address] [place] [address]  1. 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 Anemia due to multiple mechanisms  oxyCODONE (OXY IR/ROXICODONE) 5 MG immediate release tablet, CBC, Differential, Ferritin, CBC, Differential, Ferritin, CBC, Differential, Comprehensive metabolic panel, Ferritin, Vitamin B12  4. Iron deficiency anemia  oxyCODONE (OXY IR/ROXICODONE) 5 MG immediate release tablet, CBC, Differential, Ferritin, CBC, Differential, Ferritin, CBC, Differential, Comprehensive metabolic panel, Ferritin, Vitamin B12  5. Upper GI bleed  oxyCODONE (OXY IR/ROXICODONE) 5 MG immediate release tablet, CBC, Differential, Ferritin, CBC, Differential, Ferritin, CBC, Differential, Comprehensive metabolic panel, Ferritin, Vitamin B12  6. Esophageal varices  oxyCODONE (OXY IR/ROXICODONE) 5 MG immediate release tablet, CBC, Differential, Ferritin, CBC, Differential, Ferritin, CBC, Differential, Comprehensive metabolic panel, Ferritin, Vitamin B12  7. HEPATIC CIRRHOSIS    8. Other chronic nonalcoholic liver disease      CURRENT THERAPY: PRN Feraheme IV infusions  INTERVAL HISTORY: Kyle [name] 56 y.o. male returns for  regular  visit for followup of  anemia of multiple mechanisms   Kyle [name] continues to feel poor which is secondary to his other co-morbidities.  Fortunately, he does not feel worse.  I personally reviewed and went over laboratory results with the patient.  His lab work is very stable.  His platelet count is stable and one of his better  counts of 100,000.  His hemoglobin is pretty good at 11.7 g/dL. His ferritin was down to 48, but this may be falsely elevated as it is an acute phase reactant in light of his other comorbidities. His liver enzymes are unremarkable he has a slight elevation of his AST at 53. His white blood count is stable at 3.3.  All in all, the patient has one of his better laboratory results as of 04/30/2012. He did contact our clinic and reported increased restless leg syndrome which is his indication that he requires iron. Therefore he received 1020 mg a Feraheme on 05/10/2012.    Kyle [name] wishes to find another primary care physician. He is heard a wonderful things about Kyle [name] from his brother. He would like to transfer his care to her and we will help facilitate that. He is seeing an endocrinologist Dr. Lafayette Dragon and his hemoglobin A1c according to the patient was 6.1.  The patient continues to smoke one pack per day. He is not inch in quitting at this point time. I did provide him with some smoking cessation education.  Otherwise, the patient denies any complaints. He remains very stable in regards to his laboratory work and we will continue giving him IV iron as deemed necessary.  Past Medical History  Diagnosis Date  . DM (diabetes mellitus)   . Cirrhosis     bx proven steatohepatitis with cirrhosis (2010); per note from Feb 2011, received Hep A and B vaccines in 2010  . HTN (hypertension)   . RLS (restless legs syndrome)   . Sleep apnea   . Hyperlipidemia   . IDA (iron deficiency anemia)   . GERD (gastroesophageal reflux disease)   . Depression   . Peripheral neuropathy   . Urothelial cancer     2010, paillary low-grade, h/o recurrence 2011  . B12  deficiency   . Psoriasis   . 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 Anemia due to multiple mechanisms 05/13/2011  . History of alcohol abuse 05/13/2011  . ANEMIA-IRON DEFICIENCY 03/09/2009  . S/P endoscopy May 2012    4 columns grade II  esophageal varices; due for repeat in Nov 2013   . S/P colonoscopy May 2012    Tubular adenoma  . Low back pain   . Cancer     bladder ca 05/2009 removal and  w/chemo wash  . Esophageal varices with bleeding 11/24/11    s/p emergent EGD 11/25/11 by Dr. Rhea Belton at [facility], Grade III esophageal varices s/p banding X 5  . Small bowel obstruction 02/15/2012    Admitted to [facility], managed by Dr. Leticia Penna, ventral hernia manually reduced  . Ventral hernia 02/15/12  . MRSA (methicillin resistant Staphylococcus aureus)   . Acute post-ligation esophageal ulcer with hemorrhage 04/11/2012  . Sleep apnea     has BLADDER CANCER; DIABETES MELLITUS, TYPE II, CONTROLLED, WITH COMPLICATIONS; ADRENAL MASS; HYPERLIPIDEMIA; ANEMIA-IRON DEFICIENCY; ANEMIA, VITAMIN B12 DEFICIENCY; SMOKER; DEPRESSION; SLEEP APNEA, OBSTRUCTIVE, MODERATE; RESTLESS LEG SYNDROME; HYPERTENSION; GERD; HEPATIC CIRRHOSIS; Other chronic nonalcoholic liver disease; ARTHRITIS; SHOULDER PAIN, LEFT; INSOMNIA; FATIGUE; WEIGHT GAIN; CHEST DISCOMFORT; PROTEINURIA; ABNORMAL ELECTROCARDIOGRAM; Thrombocytopenia due to hypersplenism; Anemia due to multiple mechanisms; History of alcohol abuse; Iron deficiency anemia; Abdominal pain; External hemorrhoid; Upper GI bleed; Hypotension; Diarrhea; Esophageal varices; Umbilical hernia; History of small bowel obstruction; Acute post-ligation esophageal ulcer with hemorrhage; Acute posthemorrhagic anemia; and Acute respiratory failure on his problem list.     is allergic to ibuprofen and tylenol.  Kyle [name] does not currently have medications on file.  Past Surgical History  Procedure Date  . Carpel tunnel   . Shoulder surgery   . Adrenal mass surgery 05/2009    benign, left  . Bladder surgery 01/2009 and 06/2010    cancer 2010, small recurrence in 06/2010  . Colonoscopy 04/2009    moderate int hemorrhoids, rare sigmoid diverticula, one mm sessile hyperplastic rectal polyp  . Esophagogastroduodenoscopy 03/2009    small  hh  . Small bowel capsule endoscopy 03/2009    couple of small benign appearing erosions, nonbleeding  . Egd/tcs 08/2007    small hiatal hernia, pancolonic diverticula, friable anal canal, 3cm salmon colored epithelium in distal esophagus, bx negative for Barrett's  . Skin cancer excision Oct 2012    left arm  . Esophagogastroduodenoscopy 11/25/2011    Procedure: ESOPHAGOGASTRODUODENOSCOPY (EGD);  Surgeon: Erick Blinks, MD;  Location: [facility] ENDOSCOPY;  Service: Gastroenterology;  Laterality: Left;  . Esophagogastroduodenoscopy 01/14/2012    Dr Rourk->4-5 columns Gr2 varices, 6 bands placed, HH, distal esophageal ulcer, portal gastropathy, antral erosions  . Esophagogastroduodenoscopy 03/17/2012    Esophageal varices status post band ligation. Hiatal hernia. Portal gastropathy.  . Umbilical hernia repair 04/05/2012    Procedure: HERNIA REPAIR UMBILICAL ADULT;  Surgeon: Fabio Bering, MD;  Location: [facility]S;  Service: General;  Laterality: N/A;  . Esophagogastroduodenoscopy 04/11/2012    Esophageal ulcer possibly at previous banding sites with stigmata     of bleeding.  Attempt at hemostasis with hemoclip and banding was not successful resulting in recurrence of bleed/  Portal gastropathy, but no evidence of gastric varices/ Recurrent esophageal varices grade 2.    Denies any headaches, dizziness, double vision, fevers, chills, night sweats, nausea, vomiting, diarrhea, constipation, chest pain, heart palpitations, shortness of breath, blood in stool, black tarry stool, urinary pain, urinary burning, urinary frequency, hematuria.  PHYSICAL EXAMINATION  ECOG PERFORMANCE STATUS: 1 - Symptomatic but completely ambulatory  Filed Vitals:   05/18/12 1137  BP: 114/70  Pulse: 78  Temp: 98.1 F (36.7 C)    GENERAL:alert, no distress, well nourished, well developed, comfortable, cooperative, obese and smiling SKIN: skin color, texture, turgor are normal, no rashes or significant lesions HEAD:  Normocephalic, No masses, lesions, tenderness or abnormalities EYES: normal, Conjunctiva are pink and non-injected EARS: External ears normal OROPHARYNX:lips, buccal mucosa, and tongue normal and mucous membranes are moist  NECK: supple, trachea midline LYMPH:  not examined BREAST:not examined LUNGS: clear to auscultation  HEART: regular rate & rhythm, no murmurs, no gallops, S1 normal and S2 normal ABDOMEN:non-tender, obese, normal bowel sounds and tight abdomen BACK: Back symmetric, no curvature., No CVA tenderness EXTREMITIES:less then 2 second capillary refill, no joint deformities, effusion, or inflammation, no edema, no skin discoloration, no clubbing, no cyanosis  NEURO: alert & oriented x 3 with fluent speech, no focal motor/sensory deficits, gait normal   LABORATORY DATA: Results for Kyle [name], Kyle [name] (MRN [id number]) as of 05/18/2012 13:46  Ref. Range 04/30/2012 10:21  Sodium Latest Range: 135-145 mEq/L 136  Potassium Latest Range: 3.5-5.1 mEq/L 4.2  Chloride Latest Range: 96-112 mEq/L 102  CO2 Latest Range: 19-32 mEq/L 22  BUN Latest Range: 6-23 mg/dL 11  Creat Latest Range: 0.50-1.35 mg/dL 4.09  Calcium Latest Range: 8.4-10.5 mg/dL 9.7  GFR calc non Af Amer Latest Range: >90 mL/min >90  GFR calc Af Amer Latest Range: >90 mL/min >90  Glucose Latest Range: 70-99 mg/dL 811 (H)  Alkaline Phosphatase Latest Range: 39-117 U/L 79  Albumin Latest Range: 3.5-5.2 g/dL 3.2 (L)  AST Latest Range: 0-37 U/L 53 (H)  ALT Latest Range: 0-53 U/L 39  Total Protein Latest Range: 6.0-8.3 g/dL 7.6  Total Bilirubin Latest Range: 0.3-1.2 mg/dL 0.7  Ferritin Latest Range: 22-322 ng/mL 48  WBC Latest Range: 4.0-10.5 K/uL 3.3 (L)  RBC Latest Range: 4.22-5.81 MIL/uL 3.85 (L)  Hemoglobin Latest Range: 13.0-17.0 g/dL 91.4 (L)  HCT Latest Range: 39.0-52.0 % 35.9 (L)  MCV Latest Range: 78.0-100.0 fL 93.2  MCH Latest Range: 26.0-34.0 pg 30.4  MCHC Latest Range: 30.0-36.0 g/dL 78.2  RDW Latest Range:  11.5-15.5 % 16.3 (H)  Platelets Latest Range: 150-400 K/uL 100 (L)  Neutrophils Relative Latest Range: 43-77 % 56  Lymphocytes Relative Latest Range: 12-46 % 33  Monocytes Relative Latest Range: 3-12 % 9  Eosinophils Relative Latest Range: 0-5 % 3  Basophils Relative Latest Range: 0-1 % 0  NEUT# Latest Range: 1.7-7.7 K/uL 1.8  Lymphocytes Absolute Latest Range: 0.7-4.0 K/uL 1.1  Monocytes Absolute Latest Range: 0.1-1.0 K/uL 0.3  Eosinophils Absolute Latest Range: 0.0-0.7 K/uL 0.1  Basophils Absolute Latest Range: 0.0-0.1 K/uL 0.0      ASSESSMENT:  1. Multifactorial anemia, last Feraheme 1020 IV infusion was on 05/10/2012. 2. Iron deficiency anemia  3. Liver disease with fatty infiltration  4. Obesity  5. Probably lung disease secondary to longstanding smoking history.  6. DM   PLAN: 1. I personally reviewed and went over laboratory results with the patient. 2. Lab work every 6 weeks: CBC diff, Ferritin 3. Lab work in 6 weeks: B12 level 4. Lab work in 3 months: CMET 5. Referral to Kyle [name] for PCP establishment. 6. Follow-up in 3 months for follow-up.  Will Infuse Feraheme 1020 mg pending lab results.   All questions were answered. The patient knows to call the clinic with any problems, questions or concerns.  We can certainly see the patient much sooner if necessary.  The patient and plan discussed with Glenford Peers, MD and he is in agreement with the aforementioned.  Anuel [name]

## 2012-05-21 ENCOUNTER — Telehealth: Payer: Self-pay

## 2012-05-21 NOTE — Telephone Encounter (Signed)
LMOM for pt.  

## 2012-05-21 NOTE — Telephone Encounter (Signed)
OK to schedule.  Hold diabetes meds morning of procedure.

## 2012-05-21 NOTE — Telephone Encounter (Signed)
Called and triaged pt. No new medical problems. No change in medications.

## 2012-05-24 ENCOUNTER — Encounter (HOSPITAL_COMMUNITY): Payer: Self-pay | Admitting: *Deleted

## 2012-05-24 ENCOUNTER — Ambulatory Visit (HOSPITAL_COMMUNITY)
Admission: RE | Admit: 2012-05-24 | Discharge: 2012-05-24 | Disposition: A | Discharge status: Home Health | Payer: Medicaid Other | Source: Ambulatory Visit | Attending: Internal Medicine | Admitting: Internal Medicine

## 2012-05-24 ENCOUNTER — Encounter (HOSPITAL_COMMUNITY)
Admission: RE | Disposition: A | Discharge status: Home Health | Payer: Self-pay | Source: Ambulatory Visit | Attending: Internal Medicine

## 2012-05-24 ENCOUNTER — Telehealth: Payer: Self-pay | Admitting: Internal Medicine

## 2012-05-24 DIAGNOSIS — Z79899 Other long term (current) drug therapy: Secondary | ICD-10-CM | POA: Insufficient documentation

## 2012-05-24 DIAGNOSIS — K922 Gastrointestinal hemorrhage, unspecified: Secondary | ICD-10-CM

## 2012-05-24 DIAGNOSIS — I8500 Esophageal varices without bleeding: Secondary | ICD-10-CM

## 2012-05-24 DIAGNOSIS — K319 Disease of stomach and duodenum, unspecified: Secondary | ICD-10-CM

## 2012-05-24 DIAGNOSIS — K7460 Unspecified cirrhosis of liver: Secondary | ICD-10-CM | POA: Insufficient documentation

## 2012-05-24 DIAGNOSIS — I8510 Secondary esophageal varices without bleeding: Secondary | ICD-10-CM | POA: Insufficient documentation

## 2012-05-24 DIAGNOSIS — I10 Essential (primary) hypertension: Secondary | ICD-10-CM | POA: Insufficient documentation

## 2012-05-24 DIAGNOSIS — E119 Type 2 diabetes mellitus without complications: Secondary | ICD-10-CM | POA: Insufficient documentation

## 2012-05-24 DIAGNOSIS — Z794 Long term (current) use of insulin: Secondary | ICD-10-CM | POA: Insufficient documentation

## 2012-05-24 HISTORY — PX: ESOPHAGOGASTRODUODENOSCOPY: SHX5428

## 2012-05-24 LAB — GLUCOSE, CAPILLARY: Glucose-Capillary: 151 mg/dL — ABNORMAL HIGH (ref 70–99)

## 2012-05-24 SURGERY — EGD (ESOPHAGOGASTRODUODENOSCOPY)
Anesthesia: Moderate Sedation

## 2012-05-24 MED ORDER — MIDAZOLAM HCL 5 MG/5ML IJ SOLN
INTRAMUSCULAR | Status: AC
  Filled 2012-05-24: qty 10

## 2012-05-24 MED ORDER — MEPERIDINE HCL 100 MG/ML IJ SOLN
INTRAMUSCULAR | Status: DC | PRN
  Administered 2012-05-24: 25 mg via INTRAVENOUS
  Administered 2012-05-24: 50 mg via INTRAVENOUS

## 2012-05-24 MED ORDER — SODIUM CHLORIDE 0.45 % IV SOLN
Freq: Once | INTRAVENOUS | Status: AC
  Administered 2012-05-24: 1000 mL via INTRAVENOUS

## 2012-05-24 MED ORDER — BUTAMBEN-TETRACAINE-BENZOCAINE 2-2-14 % EX AERO
INHALATION_SPRAY | CUTANEOUS | Status: DC | PRN
  Administered 2012-05-24: 2 via TOPICAL

## 2012-05-24 MED ORDER — PROPRANOLOL HCL 40 MG PO TABS: 80.0000 mg | ORAL_TABLET | Freq: Two times a day (BID) | ORAL | Status: DC

## 2012-05-24 MED ORDER — MEPERIDINE HCL 100 MG/ML IJ SOLN
INTRAMUSCULAR | Status: AC
  Filled 2012-05-24: qty 2

## 2012-05-24 MED ORDER — STERILE WATER FOR IRRIGATION IR SOLN
Status: DC | PRN
  Administered 2012-05-24: 09:00:00

## 2012-05-24 MED ORDER — MIDAZOLAM HCL 5 MG/5ML IJ SOLN
INTRAMUSCULAR | Status: DC | PRN
  Administered 2012-05-24: 1 mg via INTRAVENOUS
  Administered 2012-05-24: 2 mg via INTRAVENOUS

## 2012-05-24 NOTE — Telephone Encounter (Signed)
Referral has been made to [facility] and they will call the patient to set up appointment date and time and patient is aware

## 2012-05-24 NOTE — Op Note (Signed)
[facility] [address] [place], [place]  [address]  ENDOSCOPY PROCEDURE REPORT  PATIENT:  Kyle [name], Kyle [name]  MR#:  [id number] BIRTHDATE:  [birth year], 56 yrs. old  GENDER:  male  ENDOSCOPIST:  R. Roetta Sessions, MD [facility] Referred by:  Leodis Sias, M.D.  PROCEDURE DATE:  05/24/2012 PROCEDURE:  Surveillance EGD  INDICATIONS:   history of [name]/cirrhosis with recurrent upper GI bleeding secondary to esophageal varices. Refractory to prophylactic banding therapy. On Inderal 40 mg 3 times daily-heart rate remains normal in the 70s. He is tolerating this dose very well.  INFORMED CONSENT:   The risks, benefits, limitations, alternatives and imponderables have been discussed.  The potential for biopsy, esophogeal dilation, etc. have also been reviewed.  Questions have been answered.  All parties agreeable.  Please see the history and physical in the medical record for more information.  MEDICATIONS:      Demerol 75 mg IV and Versed 3 mg IV in divided doses. Cetacaine spray.  DESCRIPTION OF PROCEDURE:   The EG-[id number] ([id number]) endoscope was introduced through the mouth and advanced to the second portion of the duodenum without difficulty or limitations.  The mucosal surfaces were surveyed very carefully during advancement of the scope and upon withdrawal.  Retroflexion view of the proximal stomach and esophagogastric junction was performed.  <<PROCEDUREIMAGES>>  FINDINGS:  Persisting 3-4 columns of grade 2-3 esophageal varices with overlying scar. No bleeding stigmata. Stomach empty;  diffuse snake skinning or fish scale appearance of the gastric mucosa consistent with portal gastropathy. Patent pylorus. Normal first and second portion of the duodenum  THERAPEUTIC / DIAGNOSTIC MANEUVERS PERFORMED:  None. I elected not to attempt to place further bands as they have not worked in this patient and the risks of precipitating a variceal bleed are not felt to be worth the  benefit at this time.  COMPLICATIONS:   None  IMPRESSION:   Esophageal varices. Portal gastropathy.  RECOMMENDATIONS:  Increase Inderal to 80 mg twice daily. Refer to a hepatology Center for further evaluation. Specifically, to consider TIPS placement,etc..  ______________________________ R. Roetta Sessions, MD [facility]  CC:  n. eSIGNED:   R. Roetta Sessions at 05/24/2012 09:37 AM  Bethel Born, [phone]

## 2012-05-24 NOTE — Discharge Instructions (Signed)
EGD Discharge instructions Please read the instructions outlined below and refer to this sheet in the next few weeks. These discharge instructions provide you with general information on caring for yourself after you leave the hospital. Your doctor may also give you specific instructions. While your treatment has been planned according to the most current medical practices available, unavoidable complications occasionally occur. If you have any problems or questions after discharge, please call your doctor. ACTIVITY  You may resume your regular activity but move at a slower pace for the next 24 hours.   Take frequent rest periods for the next 24 hours.   Walking will help expel (get rid of) the air and reduce the bloated feeling in your abdomen.   No driving for 24 hours (because of the anesthesia (medicine) used during the test).   You may shower.   Do not sign any important legal documents or operate any machinery for 24 hours (because of the anesthesia used during the test).  NUTRITION  Drink plenty of fluids.   You may resume your normal diet.   Begin with a light meal and progress to your normal diet.   Avoid alcoholic beverages for 24 hours or as instructed by your caregiver.  MEDICATIONS  You may resume your normal medications unless your caregiver tells you otherwise.  WHAT YOU CAN EXPECT TODAY  You may experience abdominal discomfort such as a feeling of fullness or "gas" pains.  FOLLOW-UP  Your doctor will discuss the results of your test with you.  SEEK IMMEDIATE MEDICAL ATTENTION IF ANY OF THE FOLLOWING OCCUR:  Excessive nausea (feeling sick to your stomach) and/or vomiting.   Severe abdominal pain and distention (swelling).   Trouble swallowing.   Temperature over 101 F (37.8 C).   Rectal bleeding or vomiting of blood.     You still have esophageal varices. I do recommend we get you an appointment at one of the liver transplant centers for further  evaluation and to consider TI PS placement  Increase Inderal to 80 mg orally twice daily

## 2012-05-24 NOTE — H&P (Signed)
Chief Complaint   Patient presents with   .  Follow-up    HPI: Kyle [name] is a 56 y.o. male here to discuss plan of care. Patient was hospitalized 5/19 - 04/13/12. Presented to [facility] ED with massive UGI bleed. EGD by Dr. Karilyn Cota on 5/19. 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 Application of Hemoclip not successful and resulted in active bleeding. Single band applied distal to the ulcer but not successful. Patient intubated. Transferred to [facility] for consideration of TIPS. Seen by Dr. Leone Payor, EGD 5/20. Bicap to distal esophageal ulcer, applied another band to another varix. Treated with octreotide and Rocephin. 5 units of prbcs total per patient. Patient states he left AMA yesterday after being told he would going to be transferred to [facility] but states after eight hours he remained in his ICU room without any monitors connected and no assistance by staff.  Patient states he wants to know what his plan of care will be. ?needs TIPS or referral for transplant as suggested while at [facility]. Patient has never required LVAP. 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 He had EGD with banding on 11/2011 for active bleeding, EGD 12/2011, 02/2012 as well.  04/05/12 repair of umbilical hernia Dr. Suzette Battiest. Given Ibuprofen/hydrocodone for post-op pain.  Seen in office 04/14/2012. Propranolol increased to 40 mg orally 3 times a day. He is tolerating this regimen well. No further bleeding. Current Outpatient Prescriptions   Medication  Sig  Dispense  Refill   .  fish oil-omega-3 fatty acids 1000 MG capsule  Take 1 g by mouth 3 (three) times daily.     .  furosemide (LASIX) 20 MG tablet  Take 20 mg by mouth daily.     [name]  gabapentin (NEURONTIN) 300 MG capsule  Take 600-900 mg by mouth 2 (two) times daily. Patient takes 600mg  at about 1700 hours and then takes 900mg  at bedtime     .  hydrocodone-ibuprofen (VICOPROFEN) 5-200 MG per  tablet  Take 1-2 tablets by mouth every 4 (four) hours as needed for pain.  45 tablet  0   .  insulin aspart (NOVOLOG) 100 UNIT/ML injection  Inject 25-35 Units into the skin 2 (two) times daily. Sliding Scale  Checks blood sugar 3 to 4 times daily     .  insulin glargine (LANTUS) 100 UNIT/ML injection  Inject 60 Units into the skin 2 (two) times daily. Before breakfast & Dinner     .  lisinopril (PRINIVIL,ZESTRIL) 20 MG tablet  Take 10 mg by mouth daily.     .  magnesium oxide (MAG-OX) 400 MG tablet  Take 400 mg by mouth daily.     .  metFORMIN (GLUCOPHAGE) 1000 MG tablet  Take 1,000 mg by mouth 2 (two) times daily with a meal.     .  Multiple Vitamin (MULTIVITAMIN) capsule  Take 1 capsule by mouth daily.     .  mupirocin ointment (BACTROBAN) 2 %  Apply 1 application topically 2 (two) times daily.     .  naproxen sodium (ALEVE) 220 MG tablet  Take 220 mg by mouth 2 (two) times daily as needed. Pain     .  omeprazole (PRILOSEC) 40 MG capsule  Take 40 mg by mouth daily.     .  pramipexole (MIRAPEX) 0.5 MG tablet  Take 0.5 mg by mouth at bedtime.     .  propranolol (INDERAL) 40 MG  tablet  Take 40 mg by mouth 2 (two) times daily.     .  simvastatin (ZOCOR) 20 MG tablet  Take 20 mg by mouth every evening.     .  traZODone (DESYREL) 50 MG tablet  Take 50 mg by mouth at bedtime.      No current facility-administered medications for this visit.    Facility-Administered Medications Ordered in Other Visits   Medication  Dose  Route  Frequency  Provider  Last Rate  Last Dose   .  DISCONTD: 0.9 % sodium chloride infusion  250 mL  Intravenous  PRN  Simonne Martinet, NP     .  DISCONTD: 0.9 % sodium chloride infusion   Intravenous  Continuous  Nelda Bucks, MD  75 mL/hr at 04/11/12 1853  500 mL at 04/11/12 1853   .  DISCONTD: antiseptic oral rinse (BIOTENE) solution 15 mL  15 mL  Mouth Rinse  QID  Nelda Bucks, MD   15 mL at 04/13/12 1230   .  DISCONTD: chlorhexidine (PERIDEX) 0.12 % solution 15  mL  15 mL  Mouth Rinse  BID  Nelda Bucks, MD   15 mL at 04/13/12 0800   .  DISCONTD: gabapentin (NEURONTIN) capsule 600-900 mg  600-900 mg  Oral  BID  Oretha Milch, MD     .  DISCONTD: gabapentin (NEURONTIN) capsule 900 mg  900 mg  Oral  QHS  Nelda Bucks, MD     .  DISCONTD: gabapentin (NEURONTIN) tablet 600 mg  600 mg  Oral  QAC supper  Nelda Bucks, MD     .  DISCONTD: gabapentin (NEURONTIN) tablet 900 mg  900 mg  Oral  QHS  Nelda Bucks, MD     .  DISCONTD: insulin aspart (novoLOG) injection 0-9 Units  0-9 Units  Subcutaneous  Q4H  Vania Rea, MD   2 Units at 04/13/12 1230   .  DISCONTD: ondansetron (ZOFRAN) injection 4 mg  4 mg  Intravenous  Q4H PRN  Vania Rea, MD     .  DISCONTD: pantoprazole (PROTONIX) injection 40 mg  40 mg  Intravenous  Q24H  Dianah Field, PA   40 mg at 04/13/12 1200    Allergies as of 04/14/2012 - Review Complete 04/14/2012   Allergen  Reaction  Noted   .  Tylenol (acetaminophen)  Other (See Comments)  07/17/2011    ROS:  General: Negative for anorexia, weight loss, fever, chills, fatigue, weakness.  ENT: Negative for hoarseness, difficulty swallowing , nasal congestion.  CV: Negative for chest pain, angina, palpitations, dyspnea on exertion, peripheral edema.  Respiratory: Negative for dyspnea at rest, dyspnea on exertion, sputum, wheezing. Slight cough since intubation.  GI: See history of present illness.  GU: Negative for dysuria, hematuria, urinary incontinence, urinary frequency, nocturnal urination.  Endo: Negative for unusual weight change.  Physical Examination:  General: Well-nourished, well-developed in no acute distress. Accompanied by wife.  Eyes: No icterus.  Mouth: Oropharyngeal mucosa moist and pink , no lesions erythema or exudate.  Lungs: Clear to auscultation bilaterally.  Heart: Regular rate and rhythm, no murmurs rubs or gallops. Site of jugular venous access looks good. No drainage or bleeding.    Abdomen: Bowel sounds are normal, nontender, nondistended, no hepatosplenomegaly or masses, no abdominal bruits, no rebound or guarding. Umbilicus with bandages and no drainage. S/p recent hernia repair.  Extremities: 1+lower extremity edema. No clubbing or deformities.  Neuro: Alert and  oriented x 4  Skin: Warm and dry, no jaundice.  Psych: Alert and cooperative, normal mood and affect.  Labs:  Lab Results   Component  Value  Date    WBC  5.0  04/13/2012    HGB  9.9*  04/13/2012    HCT  29.8*  04/13/2012    MCV  89.8  04/13/2012    PLT  58*  04/13/2012    Lab Results   Component  Value  Date    CREATININE  0.85  04/13/2012    BUN  10  04/13/2012    NA  141  04/13/2012    K  3.9  04/13/2012    CL  108  04/13/2012    CO2  26  04/13/2012    Lab Results   Component  Value  Date    ALT  38  04/13/2012    AST  48*  04/13/2012    ALKPHOS  46  04/13/2012    BILITOT  1.0  04/13/2012    Lab Results   Component  Value  Date    INR  1.45  04/11/2012    INR  1.25  04/11/2012    INR  1.27  02/15/2012    Impression: Recent massive UGI bleed which began evening of 04/11/12. Bleeding from ulcerated area in esophagus likely from prior banding. EGD X 2 and 5 units of prbcs as outlined above. Question of TIPS has been brought up given recent massive bleeding.  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  Patient needs repeat EGD now to assess varices. It may be in patient's best interest to visit a transplant center to get their opinion, particularly regarding TIPS.  The  risks, benefits, limitations, alternatives and imponderables have been reviewed with the patient. Potential for esophageal banding, biopsy, etc. have also been reviewed.  Questions have been answered. All parties agreeable.

## 2012-05-24 NOTE — Telephone Encounter (Signed)
Message copied by Glendora Score on Mon May 24, 2012 12:32 PM ------      Message from: Corbin Ade      Created: Mon May 24, 2012  9:26 AM       Patient needs referral to either [facility] or [facility] for assessment. 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 Would he benefit from a TIPS? ([facility] go to one of the hepatology clinics there)

## 2012-05-26 ENCOUNTER — Telehealth: Payer: Self-pay | Admitting: Internal Medicine

## 2012-05-26 ENCOUNTER — Telehealth: Payer: Self-pay

## 2012-05-26 ENCOUNTER — Encounter (HOSPITAL_COMMUNITY): Payer: Self-pay | Admitting: Internal Medicine

## 2012-05-26 NOTE — Telephone Encounter (Signed)
Pt called about his Inderal. He only got 90 tablets, but he needs 120 tablets because he takes in 80 mg BID. His Rx is for 40 mg tablet so he only needs 30 more pills for this month but next month he will need 120 tablets.Please advise

## 2012-05-26 NOTE — Telephone Encounter (Signed)
Why is he going to [facility] I wanted him to go to a center that has a hepatology/transplant center. I have not asked him to be referred there. [facility] or [facility] has the liver expertise.

## 2012-05-26 NOTE — Telephone Encounter (Signed)
CORRECTION:   Confirmation from [facility] patient is scheduled with Dr. Marcheta Grammes on 06/24/12 at 11:40 AM  [facility] Division of Gastroenterology and Hepatology.

## 2012-05-26 NOTE — Telephone Encounter (Signed)
We received conformation from [facility] that Kyle [name] has an appointment with Dr. Marcheta Grammes on 06/24/12 at 11:40 am.

## 2012-05-26 NOTE — Telephone Encounter (Signed)
Open in error

## 2012-05-26 NOTE — Telephone Encounter (Signed)
Route to refill box  

## 2012-05-28 MED ORDER — PROPRANOLOL HCL 80 MG PO TABS: 80.0000 mg | ORAL_TABLET | Freq: Two times a day (BID) | ORAL | Status: DC

## 2012-05-28 NOTE — Telephone Encounter (Signed)
Please call pt. He should take (2) 40mg  tabs BID until he runs out of what he has. I have sent a new Rx to [facility] to start after he runs out. Please let him know that the new RX is for 80mg  strength tabs so he will ONLY TAKE 1 TAB BID with the new one & need #60 for a month. Thanks

## 2012-05-28 NOTE — Telephone Encounter (Signed)
Pt is aware of new Rx and will go pick it up when he get low on medication

## 2012-06-03 NOTE — Progress Notes (Signed)
Inderal has been increased since these vitals taken. No further recommendations.

## 2012-06-22 ENCOUNTER — Other Ambulatory Visit (HOSPITAL_COMMUNITY): Payer: Self-pay | Admitting: General Surgery

## 2012-06-22 DIAGNOSIS — K469 Unspecified abdominal hernia without obstruction or gangrene: Secondary | ICD-10-CM

## 2012-06-24 ENCOUNTER — Other Ambulatory Visit (HOSPITAL_COMMUNITY): Payer: Medicaid Other

## 2012-06-25 ENCOUNTER — Encounter (HOSPITAL_COMMUNITY): Payer: Self-pay

## 2012-06-25 ENCOUNTER — Ambulatory Visit (HOSPITAL_COMMUNITY)
Admission: RE | Admit: 2012-06-25 | Discharge: 2012-06-25 | Disposition: A | Discharge status: Home / Self Care | Payer: Medicaid Other | Source: Ambulatory Visit | Attending: General Surgery | Admitting: General Surgery

## 2012-06-25 DIAGNOSIS — K229 Disease of esophagus, unspecified: Secondary | ICD-10-CM | POA: Insufficient documentation

## 2012-06-25 DIAGNOSIS — K7460 Unspecified cirrhosis of liver: Secondary | ICD-10-CM | POA: Insufficient documentation

## 2012-06-25 DIAGNOSIS — R109 Unspecified abdominal pain: Secondary | ICD-10-CM | POA: Insufficient documentation

## 2012-06-25 DIAGNOSIS — I10 Essential (primary) hypertension: Secondary | ICD-10-CM | POA: Insufficient documentation

## 2012-06-25 DIAGNOSIS — E119 Type 2 diabetes mellitus without complications: Secondary | ICD-10-CM | POA: Insufficient documentation

## 2012-06-25 DIAGNOSIS — R935 Abnormal findings on diagnostic imaging of other abdominal regions, including retroperitoneum: Secondary | ICD-10-CM | POA: Insufficient documentation

## 2012-06-25 DIAGNOSIS — K469 Unspecified abdominal hernia without obstruction or gangrene: Secondary | ICD-10-CM

## 2012-06-25 DIAGNOSIS — Z9889 Other specified postprocedural states: Secondary | ICD-10-CM | POA: Insufficient documentation

## 2012-06-25 NOTE — Progress Notes (Unsigned)
PLEASE CALL PT.   Per SLF:   IT APPEARS FROM THE NOTE HE IS ONLY GOING TO [facility] FOR AN EVALUATION. YOU CAN ASK THE [facility] FOLKS IF THEY WILL ALLOW YOU TO GET A TIPS IN [place] IF HE NEEDS ONE.      DR. Jena Gauss WILL BE BACK NEXT WEEK AND MAY BE ABLE TO ANSWER HIS QUESTION MORE COMPLETELY.

## 2012-06-25 NOTE — Progress Notes (Unsigned)
Tried to call pt- LM to call back

## 2012-06-25 NOTE — Progress Notes (Unsigned)
Per RMR-Pt related a negative experience at [facility] last time - signed out AMA; needs to be acquainted with a transplant center even though MELD relatively low; as far as TIPS is concerned, I suppose he can go anywhere he wants.  ----- Message -----  From: West Bali, MD Sent: 06/24/2012 3:05 PM  To: Corbin Ade, MD  Pt wants to know why he needs to go to [facility] for TIPS IF HE CAN GET A TIPS IN [place]?

## 2012-06-28 ENCOUNTER — Telehealth: Payer: Self-pay

## 2012-06-28 NOTE — Telephone Encounter (Signed)
Pt called- he is still having abd pain and diarrhea daily. He said sometimes its just once a day and sometimes its multiple times a day. The levsin works sometimes and sometimes it doesn't help at all. Pt also has nausea, but no vomiting. Pt wants to know why this is happening and wants to know if there is anything else we can do? Please advise. (pt is aware that RMR is out of the office until tomorrow)

## 2012-06-28 NOTE — Progress Notes (Signed)
Spoke with pt- he has appt at [facility] this month. He said he would go and see what they had to say.

## 2012-06-29 ENCOUNTER — Other Ambulatory Visit: Payer: Self-pay | Admitting: Internal Medicine

## 2012-06-29 ENCOUNTER — Telehealth: Payer: Self-pay | Admitting: Internal Medicine

## 2012-06-29 ENCOUNTER — Other Ambulatory Visit: Payer: Self-pay

## 2012-06-29 ENCOUNTER — Encounter (HOSPITAL_COMMUNITY): Payer: Medicaid Other | Attending: Oncology

## 2012-06-29 DIAGNOSIS — D649 Anemia, unspecified: Secondary | ICD-10-CM | POA: Insufficient documentation

## 2012-06-29 DIAGNOSIS — I8500 Esophageal varices without bleeding: Secondary | ICD-10-CM

## 2012-06-29 DIAGNOSIS — D509 Iron deficiency anemia, unspecified: Secondary | ICD-10-CM | POA: Insufficient documentation

## 2012-06-29 DIAGNOSIS — R197 Diarrhea, unspecified: Secondary | ICD-10-CM

## 2012-06-29 DIAGNOSIS — D6959 Other secondary thrombocytopenia: Secondary | ICD-10-CM | POA: Insufficient documentation

## 2012-06-29 DIAGNOSIS — K922 Gastrointestinal hemorrhage, unspecified: Secondary | ICD-10-CM | POA: Insufficient documentation

## 2012-06-29 LAB — CBC
MCH: 30 pg (ref 26.0–34.0)
Platelets: 68 10*3/uL — ABNORMAL LOW (ref 150–400)
RBC: 4.46 MIL/uL (ref 4.22–5.81)
WBC: 4.3 10*3/uL (ref 4.0–10.5)

## 2012-06-29 LAB — FERRITIN: Ferritin: 24 ng/mL (ref 22–322)

## 2012-06-29 LAB — DIFFERENTIAL
Eosinophils Absolute: 0.1 10*3/uL (ref 0.0–0.7)
Lymphs Abs: 1.3 10*3/uL (ref 0.7–4.0)
Neutro Abs: 2.5 10*3/uL (ref 1.7–7.7)
Neutrophils Relative %: 57 % (ref 43–77)

## 2012-06-29 LAB — VITAMIN B12: Vitamin B-12: 364 pg/mL (ref 211–911)

## 2012-06-29 NOTE — Progress Notes (Signed)
Labs drawn today for cbc/diff,ferr,vb12

## 2012-06-29 NOTE — Telephone Encounter (Signed)
Called patient on both his home number and cell phone number. Left messages. Diarrhea goes back a good year. Giardia and C. difficile previously negative. Patient on Glucophage. Recently hospitalized.  I recommend we get another stool sample for C. difficile assay. Also do a serum total IgA and transglutaminase IgA assay. If this workup is negative, will consider stopping the Glucophage.

## 2012-06-29 NOTE — Telephone Encounter (Signed)
Pt aware, lab orders faxed to lab. Pt will pick up cdiff container from the lab.

## 2012-06-29 NOTE — Telephone Encounter (Signed)
see documentation note in chart.

## 2012-06-30 ENCOUNTER — Other Ambulatory Visit (HOSPITAL_COMMUNITY): Payer: Self-pay | Admitting: Oncology

## 2012-06-30 ENCOUNTER — Other Ambulatory Visit: Payer: Self-pay | Admitting: Internal Medicine

## 2012-06-30 DIAGNOSIS — D509 Iron deficiency anemia, unspecified: Secondary | ICD-10-CM

## 2012-06-30 LAB — IGA: IgA: 1350 mg/dL — ABNORMAL HIGH (ref 68–379)

## 2012-07-01 ENCOUNTER — Encounter (HOSPITAL_BASED_OUTPATIENT_CLINIC_OR_DEPARTMENT_OTHER): Payer: Medicaid Other

## 2012-07-01 VITALS — BP 126/79 | HR 77

## 2012-07-01 DIAGNOSIS — D509 Iron deficiency anemia, unspecified: Secondary | ICD-10-CM

## 2012-07-01 LAB — CLOSTRIDIUM DIFFICILE BY PCR: Toxigenic C. Difficile by PCR: DETECTED — CR

## 2012-07-01 MED ORDER — SODIUM CHLORIDE 0.9 % IV SOLN
Freq: Once | INTRAVENOUS | Status: AC
  Administered 2012-07-01: 10:00:00 via INTRAVENOUS

## 2012-07-01 MED ORDER — SODIUM CHLORIDE 0.9 % IV SOLN
1020.0000 mg | Freq: Once | INTRAVENOUS | Status: AC
  Administered 2012-07-01: 1020 mg via INTRAVENOUS
  Filled 2012-07-01: qty 34

## 2012-07-01 MED ORDER — SODIUM CHLORIDE 0.9 % IJ SOLN
INTRAMUSCULAR | Status: AC
  Filled 2012-07-01: qty 10

## 2012-07-01 MED ORDER — METRONIDAZOLE 250 MG PO TABS: 250.0000 mg | ORAL_TABLET | Freq: Four times a day (QID) | ORAL | Status: AC

## 2012-07-01 NOTE — Progress Notes (Signed)
Tolerated infusion well. 

## 2012-07-01 NOTE — Addendum Note (Signed)
Addended by: Corbin Ade on: 07/01/2012 04:44 PM   Modules accepted: Orders

## 2012-07-02 ENCOUNTER — Other Ambulatory Visit: Payer: Self-pay

## 2012-07-02 DIAGNOSIS — I8500 Esophageal varices without bleeding: Secondary | ICD-10-CM

## 2012-07-02 DIAGNOSIS — D509 Iron deficiency anemia, unspecified: Secondary | ICD-10-CM

## 2012-07-02 DIAGNOSIS — K922 Gastrointestinal hemorrhage, unspecified: Secondary | ICD-10-CM

## 2012-07-02 DIAGNOSIS — D6959 Other secondary thrombocytopenia: Secondary | ICD-10-CM

## 2012-07-02 DIAGNOSIS — D731 Hypersplenism: Secondary | ICD-10-CM

## 2012-07-02 DIAGNOSIS — D6489 Other specified anemias: Secondary | ICD-10-CM

## 2012-07-02 NOTE — Telephone Encounter (Signed)
Pt has positive c-diff, he is requesting pain meds because he cannot take ibuprofen or tylenol. Please advise.

## 2012-07-04 NOTE — Progress Notes (Signed)
Abnormal lab value addressed on August 9

## 2012-07-07 MED ORDER — HYOSCYAMINE SULFATE ER 0.375 MG PO TB12: 375.0000 ug | ORAL_TABLET | Freq: Two times a day (BID) | ORAL | Status: DC | PRN

## 2012-07-07 NOTE — Telephone Encounter (Signed)
Please ask patient further details about pain. Normally do not give narcotics for Cdiff

## 2012-07-07 NOTE — Telephone Encounter (Signed)
Try Levbid for now. Contact us if no improvement. Hold off on narcotics.

## 2012-07-07 NOTE — Telephone Encounter (Signed)
Tried to call pt- LMOM 

## 2012-07-07 NOTE — Telephone Encounter (Signed)
Spoke with pt- he stated the pain is tolerable during the day but gets worse in the evenings, especially if he eats anything greasy. The diarrhea is better in the mornings but gets worse in the evenings as well. He said it feels like someone hit him with a baseball bat and then he has to go to the bathroom immediately. He had some hyoscyamine SL left over and has been taking that again but just ran out of it too. He said it helped a little bit but not completely. He wants to know if he can have an rx for both now.. Please advise.

## 2012-07-07 NOTE — Addendum Note (Signed)
Addended by: Nira Retort on: 07/07/2012 04:49 PM   Modules accepted: Orders

## 2012-07-08 ENCOUNTER — Other Ambulatory Visit: Payer: Self-pay | Admitting: Gastroenterology

## 2012-07-08 MED ORDER — HYOSCYAMINE SULFATE 0.125 MG SL SUBL: 0.1250 mg | SUBLINGUAL_TABLET | SUBLINGUAL | Status: DC | PRN

## 2012-07-08 NOTE — Telephone Encounter (Signed)
Pt is aware, medicaid will not pay for the time release and pt is requesting regular levsin called in. Spoke with AS and she sent in rx.

## 2012-07-13 NOTE — Progress Notes (Signed)
Per RMR- TTG level is normal no celiac disease. Serum IgA total is good actually elevated. Do not see any evidence of celiac disease. Please let him know.

## 2012-07-13 NOTE — Progress Notes (Signed)
Tried to call pt- NA 

## 2012-07-14 NOTE — Progress Notes (Signed)
Tried to call pt- NA, mailed letter

## 2012-07-20 ENCOUNTER — Other Ambulatory Visit: Payer: Self-pay | Admitting: Internal Medicine

## 2012-07-20 ENCOUNTER — Telehealth: Payer: Self-pay

## 2012-07-20 ENCOUNTER — Other Ambulatory Visit: Payer: Self-pay

## 2012-07-20 DIAGNOSIS — R197 Diarrhea, unspecified: Secondary | ICD-10-CM

## 2012-07-20 NOTE — Telephone Encounter (Signed)
Pt called- he had finished the flagyl for the cdiff on Saturday and yesterday the diarrhea and abd cramping started back like it was before. He said the diarrhea and cramping never really went away completely, it just wasn't as bad. He said now it is just like it was before he took the flagyl. Do we need another stool sample from pt to see if cdiff is gone? Please advise.

## 2012-07-20 NOTE — Telephone Encounter (Signed)
Pt aware, he is going to come by here and pick up stool sample cup and orders that have been left at the front desk for him.

## 2012-07-20 NOTE — Telephone Encounter (Signed)
Let's do another C. difficile toxin assay. If negative, I recommend he see his primary care physician to come off of Glucophage and then see if diarrhea goes away.

## 2012-07-22 ENCOUNTER — Telehealth: Payer: Self-pay

## 2012-07-22 ENCOUNTER — Other Ambulatory Visit: Payer: Self-pay | Admitting: Internal Medicine

## 2012-07-22 MED ORDER — VANCOMYCIN HCL 125 MG PO CAPS: 125.0000 mg | ORAL_CAPSULE | Freq: Four times a day (QID) | ORAL | Status: AC

## 2012-07-22 NOTE — Telephone Encounter (Signed)
Per RMR- pt needs vancomycin 125mg  qid x 10 days 0 refills. Pt is aware and rx was sent to [facility]. Advised him on handwashing, etc.

## 2012-07-22 NOTE — Telephone Encounter (Signed)
T/C from [place] at [facility] reporting a positive C-Diff. Routing to Dr. Jena Gauss and Raynelle Fanning.

## 2012-07-22 NOTE — Telephone Encounter (Signed)
Text paged RMR at 1:53pm

## 2012-07-30 ENCOUNTER — Encounter (HOSPITAL_COMMUNITY): Payer: Medicaid Other | Attending: Oncology

## 2012-07-30 DIAGNOSIS — I8500 Esophageal varices without bleeding: Secondary | ICD-10-CM | POA: Insufficient documentation

## 2012-07-30 DIAGNOSIS — D6959 Other secondary thrombocytopenia: Secondary | ICD-10-CM | POA: Insufficient documentation

## 2012-07-30 DIAGNOSIS — D509 Iron deficiency anemia, unspecified: Secondary | ICD-10-CM

## 2012-07-30 DIAGNOSIS — K922 Gastrointestinal hemorrhage, unspecified: Secondary | ICD-10-CM | POA: Insufficient documentation

## 2012-07-30 DIAGNOSIS — D649 Anemia, unspecified: Secondary | ICD-10-CM | POA: Insufficient documentation

## 2012-07-30 LAB — DIFFERENTIAL
Basophils Relative: 0 % (ref 0–1)
Eosinophils Absolute: 0.1 10*3/uL (ref 0.0–0.7)
Monocytes Absolute: 0.5 10*3/uL (ref 0.1–1.0)
Monocytes Relative: 9 % (ref 3–12)
Neutrophils Relative %: 61 % (ref 43–77)

## 2012-07-30 LAB — CBC
HCT: 43.1 % (ref 39.0–52.0)
Hemoglobin: 14.7 g/dL (ref 13.0–17.0)
MCH: 31.3 pg (ref 26.0–34.0)
MCHC: 34.1 g/dL (ref 30.0–36.0)

## 2012-07-30 NOTE — Progress Notes (Signed)
Labs drawn today for cbc/diff,ferr 

## 2012-08-09 ENCOUNTER — Telehealth: Payer: Self-pay | Admitting: Internal Medicine

## 2012-08-09 DIAGNOSIS — R197 Diarrhea, unspecified: Secondary | ICD-10-CM

## 2012-08-09 NOTE — Telephone Encounter (Signed)
Need to recheck C.Diff PCR asap.

## 2012-08-09 NOTE — Addendum Note (Signed)
Addended by: Tiffany Kocher on: 08/09/2012 12:25 PM   Modules accepted: Orders

## 2012-08-09 NOTE — Telephone Encounter (Signed)
Pt is not feeling better. Still having stomach pain and diarrhea. He asked to speak with nurse. [phone]

## 2012-08-09 NOTE — Telephone Encounter (Signed)
LMOM to call back

## 2012-08-09 NOTE — Telephone Encounter (Signed)
He did finish his anti-bx and he is some what better but still has the cramping and diarrhea. He has the pills you put under your tongue but does not think that is working anymore. Please advise

## 2012-08-09 NOTE — Telephone Encounter (Signed)
Pt will come by to pick up sample cup and order.

## 2012-08-12 ENCOUNTER — Telehealth: Payer: Self-pay

## 2012-08-12 NOTE — Telephone Encounter (Signed)
See result note.  

## 2012-08-12 NOTE — Telephone Encounter (Signed)
Pt's called wanting to know about his c-diff results. He is still having pain in his stomach. Please advise

## 2012-08-12 NOTE — Telephone Encounter (Signed)
Quick Note:  Patient has been managed by Dr. Jena Gauss more recently for C.Diff. Let pt know, his current C.Diff PCR is negative. He should be on probiotic of some sort.  He should discuss possibility of coming of metformin for trial period with his PCP to see if helps his cramps/diarrhea. OV with RMR only in next 2-3 weeks.  In interim, can try bentyl 10mg  tid prn cramps/diarrhea, #60, no refills.Do not take with the Levsin. ______

## 2012-08-13 ENCOUNTER — Other Ambulatory Visit: Payer: Self-pay

## 2012-08-13 ENCOUNTER — Other Ambulatory Visit: Payer: Self-pay | Admitting: Gastroenterology

## 2012-08-13 MED ORDER — DICYCLOMINE HCL 10 MG PO CAPS: 10.0000 mg | ORAL_CAPSULE | Freq: Three times a day (TID) | ORAL | Status: DC | PRN

## 2012-08-13 NOTE — Telephone Encounter (Signed)
Please call pharmacy. Pt was changed to bentyl per LSL on 9/19 (see note) He should not take Levsin. Did Rx get called in for Bentyl? thanks

## 2012-08-13 NOTE — Telephone Encounter (Signed)
Pt called this morning to let us know that [facility] in [place] hasn't received his prescription and could we resend it.

## 2012-09-03 ENCOUNTER — Encounter: Payer: Self-pay | Admitting: General Practice

## 2012-09-03 ENCOUNTER — Ambulatory Visit (INDEPENDENT_AMBULATORY_CARE_PROVIDER_SITE_OTHER): Payer: Medicaid Other | Admitting: Internal Medicine

## 2012-09-03 ENCOUNTER — Encounter: Payer: Self-pay | Admitting: Internal Medicine

## 2012-09-03 VITALS — BP 129/72 | HR 73 | Temp 98.4°F | Ht 67.0 in | Wt 236.0 lb

## 2012-09-03 DIAGNOSIS — K7460 Unspecified cirrhosis of liver: Secondary | ICD-10-CM

## 2012-09-03 DIAGNOSIS — R197 Diarrhea, unspecified: Secondary | ICD-10-CM

## 2012-09-03 MED ORDER — PEG-KCL-NACL-NASULF-NA ASC-C 100 G PO SOLR: 1.0000 | ORAL | Status: DC

## 2012-09-03 NOTE — Progress Notes (Signed)
Primary Care Physician:  WONG,FRANCIS PATRICK, MD Primary Gastroenterologist:  Dr. Wendelyn Kiesling  Pre-Procedure History & Physical: HPI:  Kyle [name] is a 56 y.o. male here for followup of [name]/cirrhosis. Has been seen at [facility]. Liver evaluation is in process. Patient really needs a TIPS because he is failed banding in the setting of recurrent GI bleeds. Dose of Inderal has been increased. Recent C. difficile diarrhea-recurrent. He now has protracted episodes of postprandial abdominal cramps and nonbloody diarrhea. Patient does report normal bowel function for days in between bouts of diarrhea. States until did not help. Clostridium difficile PCR negative on September 16. [facility] wants patient have a colonoscopy. Patient wants to have it down here with me. EGD to be done at [place]. Patient had a colonoscopy your half ago here in the setting of a poor prep 1 adenoma removed.  Past Medical History  Diagnosis Date  . DM (diabetes mellitus)   . Cirrhosis     bx proven steatohepatitis with cirrhosis (2010); per note from Feb 2011, received Hep A and B vaccines in 2010  . HTN (hypertension)   . RLS (restless legs syndrome)   . Sleep apnea   . Hyperlipidemia   . IDA (iron deficiency anemia)   . GERD (gastroesophageal reflux disease)   . Depression   . Peripheral neuropathy   . Urothelial cancer     2010, paillary low-grade, h/o recurrence 2011  . B12 deficiency   . Psoriasis   . 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 Anemia due to multiple mechanisms 05/13/2011  . History of alcohol abuse 05/13/2011  . ANEMIA-IRON DEFICIENCY 03/09/2009  . S/P endoscopy May 2012    4 columns grade II esophageal varices; due for repeat in Nov 2013   . S/P colonoscopy May 2012    Tubular adenoma  . Low back pain   . Cancer     bladder ca 05/2009 removal and  w/chemo wash  . Esophageal varices with bleeding 11/24/11    s/p emergent EGD 11/25/11 by Dr. Pyrtle at [facility], Grade III esophageal varices  s/p banding X 5  . Small bowel obstruction 02/15/2012    Admitted to [facility], managed by Dr. Ziegler, ventral hernia manually reduced  . Ventral hernia 02/15/12  . MRSA (methicillin resistant Staphylococcus aureus)   . Acute post-ligation esophageal ulcer with hemorrhage 04/11/2012  . Sleep apnea   . Clostridium difficile infection   . Cirrhosis of liver without mention of alcohol     has received hep A/hep B vaccines, afp-03/26/11= <1.3, ct abd 06/25/12= cirrhosis and portal venous hypertension with hepatomegaly    Past Surgical History  Procedure Date  . Carpel tunnel   . Shoulder surgery   . Adrenal mass surgery 05/2009    benign, left  . Bladder surgery 01/2009 and 06/2010    cancer 2010, small recurrence in 06/2010  . Colonoscopy 04/2009    moderate int hemorrhoids, rare sigmoid diverticula, one mm sessile hyperplastic rectal polyp  . Esophagogastroduodenoscopy 03/2009    small hh  . Small bowel capsule endoscopy 03/2009    couple of small benign appearing erosions, nonbleeding  . Egd/tcs 08/2007    small hiatal hernia, pancolonic diverticula, friable anal canal, 3cm salmon colored epithelium in distal esophagus, bx negative for Barrett's  . Skin cancer excision Oct 2012    left arm  . Esophagogastroduodenoscopy 11/25/2011    Dr. Pyrtle-grade III varices in the mid and distal esophagus, banding placed, portal astropathy  . Esophagogastroduodenoscopy   01/14/2012    Dr Brock Larmon->4-5 columns Gr2 varices, 6 bands placed, HH, distal esophageal ulcer, portal gastropathy, antral erosions  . Esophagogastroduodenoscopy 03/17/2012    Dr. Ashlynne Shetterly-Esophageal varices status post band ligation. Hiatal hernia. Portal gastropathy.  . Umbilical hernia repair 04/05/2012    Procedure: HERNIA REPAIR UMBILICAL ADULT;  Surgeon: Brent C Ziegler, MD;  Location: [facility];  Service: General;  Laterality: N/A;  . Esophagogastroduodenoscopy 04/11/2012    Dr. Rehman-Esophageal ulcer possibly at previous banding sites with stigmata      of bleeding.  Attempt at hemostasis with hemoclip and banding was not successful resulting in recurrence of bleed/  Portal gastropathy, but no evidence of gastric varices/ Recurrent esophageal varices grade 2.  . Esophagogastroduodenoscopy 05/24/2012    Dr. Hitomi Slape-esophageal varices, portal gastropathy  . Esophagogastroduodenoscopy 04/11/12    Dr. Gessner-ulcer in the distal esophagus adherent lot and visible vessel treated with bicap. grade I varices in the distal esophagus, one ligating band placed, portal hypertensive gastropathy in the body of the stomach.    Prior to Admission medications   Medication Sig Start Date End Date Taking? Authorizing Provider  dicyclomine (BENTYL) 10 MG capsule Take 1 capsule (10 mg total) by mouth 3 (three) times daily as needed. For cramps/diarrhea 08/13/12 08/13/13 Yes Leslie S Lewis, PA  fish oil-omega-3 fatty acids 1000 MG capsule Take 1 g by mouth 3 (three) times daily.    Yes Historical Provider, MD  furosemide (LASIX) 20 MG tablet Take 20 mg by mouth daily.    Yes Historical Provider, MD  gabapentin (NEURONTIN) 300 MG capsule Take 600-900 mg by mouth 2 (two) times daily. Pt takes 300 mg    Two capsules  Twice daily   Yes Historical Provider, MD  insulin aspart (NOVOLOG) 100 UNIT/ML injection Inject 25-35 Units into the skin 3 (three) times daily before meals. Sliding Scale   Yes Historical Provider, MD  insulin glargine (LANTUS) 100 UNIT/ML injection Inject 60 Units into the skin 2 (two) times daily.    Yes Historical Provider, MD  lisinopril (PRINIVIL,ZESTRIL) 20 MG tablet Take 10 mg by mouth daily.    Yes Historical Provider, MD  magnesium oxide (MAG-OX) 400 MG tablet Take 400 mg by mouth daily.   Yes Historical Provider, MD  metFORMIN (GLUCOPHAGE) 1000 MG tablet Take 1,000 mg by mouth 2 (two) times daily with a meal.   Yes Historical Provider, MD  Multiple Vitamin (MULTIVITAMIN) capsule Take 1 capsule by mouth daily.     Yes Historical Provider, MD    omeprazole (PRILOSEC) 40 MG capsule Take 40 mg by mouth daily.   Yes Historical Provider, MD  oxyCODONE (OXY IR/ROXICODONE) 5 MG immediate release tablet Take 5 mg by mouth every 4 (four) hours as needed.   Yes Historical Provider, MD  pramipexole (MIRAPEX) 0.5 MG tablet Take 0.5 mg by mouth at bedtime.    Yes Historical Provider, MD  propranolol (INDERAL) 80 MG tablet Take 1 tablet (80 mg total) by mouth 2 (two) times daily. 05/28/12  Yes Kandice L Jones, NP  simvastatin (ZOCOR) 20 MG tablet Take 20 mg by mouth every evening.   Yes Historical Provider, MD  traZODone (DESYREL) 50 MG tablet Take 100 mg by mouth at bedtime.    Yes Historical Provider, MD  hyoscyamine (LEVSIN SL) 0.125 MG SL tablet Place 2 tablets (0.25 mg total) under the tongue every 4 (four) hours as needed for cramping. 07/17/11 07/27/11  Julie Idol, PA  hyoscyamine (LEVSIN SL) 0.125 MG SL tablet Place   1 tablet (0.125 mg total) under the tongue every 4 (four) hours as needed for cramping. 07/08/12 07/18/12  Anna W Sams, NP    Allergies as of 09/03/2012 - Review Complete 09/03/2012  Allergen Reaction Noted  . Ibuprofen Other (See Comments) 05/13/2012  . Tylenol (acetaminophen) Other (See Comments) 07/17/2011    Family History  Problem Relation Age of Onset  . Cirrhosis Father     etoh  . Colon cancer Neg Hx   . Anesthesia problems Neg Hx   . Hypotension Neg Hx   . Malignant hyperthermia Neg Hx   . Pseudochol deficiency Neg Hx   . Kidney cancer Mother   . Cancer Mother   . HIV Brother   . Cirrhosis Brother     nash    History   Social History  . Marital Status: Married    Spouse Name: N/A    Number of Children: 3  . Years of Education: N/A   Occupational History  . disabled    Social History Main Topics  . Smoking status: Current Every Day Smoker -- 1.5 packs/day for 30 years    Types: Cigarettes  . Smokeless tobacco: Not on file  . Alcohol Use: No     drank heavily for few years in 20s  . Drug Use: No  .  Sexually Active: Yes    Birth Control/ Protection: None   Other Topics Concern  . Not on file   Social History Narrative  . No narrative on file    Review of Systems: See HPI, otherwise negative ROS  Physical Exam: BP 129/72  Pulse 73  Temp 98.4 F (36.9 C) (Temporal)  Ht 5' 7" (1.702 m)  Wt 236 lb (107.049 kg)  BMI 36.96 kg/m2 General:   Alert,  Well-developed, well-nourished, pleasant and cooperative in NAD Skin:  Intact without significant lesions or rashes.  Malar telangiectasias present Eyes:  Sclera clear, no icterus.   Conjunctiva pink. Ears:  Normal auditory acuity. Nose:  No deformity, discharge,  or lesions. Mouth:  No deformity or lesions. Neck:  Supple; no masses or thyromegaly. No significant cervical adenopathy. Lungs:  Clear throughout to auscultation.   No wheezes, crackles, or rhonchi. No acute distress. Heart:  Regular rate and rhythm; no murmurs, clicks, rubs,  or gallops. Abdomen:  Obese. Recent umbilical hernia repair -well healing abdomen is soft and nontender. Liver edge 4 fingers below right costal margin. No shifting dullness or fluid wave  Pulses:  Normal pulses noted. Extremities:  Without clubbing or edema.  Impression/Plan:  [name]/cirrhosis [facility] liver evaluation underway. Recent protracted diarrhea in the setting of known C. difficile. Has been treated. 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 Patient has been on metformin for years predating any issues with diarrhea - Doubt this agent is a contributing factor. [facility] wishes patient to have have a diagnostic colonoscopy which is not unreasonable this time. I have offered this gentleman  a diagnostic colonoscopy in the near future.The risks, benefits, limitations, alternatives and imponderables have been reviewed with the patient. Questions have been answered. All parties are agreeable.  Continue Inderal.  Trial of Levsin 0.125 mg one sublingually a.c. and at bedtime when  necessary abdominal cramps and diarrhea.  Patient strongly urged to keep all appointments with [facility] and  followup with their recommendations.  

## 2012-09-03 NOTE — Patient Instructions (Addendum)
Use Levsin 0.125 mg - under the tongue before meals and at bedtime as needed for abdominal cramps and diarrhea.  Schedule a colonoscopy in the near further to further evaluate diarrhea

## 2012-09-05 ENCOUNTER — Encounter: Payer: Self-pay | Admitting: Internal Medicine

## 2012-09-06 ENCOUNTER — Telehealth: Payer: Self-pay | Admitting: General Practice

## 2012-09-06 NOTE — Telephone Encounter (Signed)
Message copied by Jennings Books on Mon Sep 06, 2012  9:16 AM ------      Message from: Corbin Ade      Created: Mon Sep 06, 2012  9:06 AM      Regarding: RE: NEED DIABETIC MEDS ADJUSTMENT       You can reduce his evening dose of Lantus insulin to 35 units; otherwise, leave everything else the same.      ----- Message -----         From: Jennings Books         Sent: 09/03/2012  11:19 AM           To: Corbin Ade, MD      Subject: NEED DIABETIC MEDS ADJUSTMENT                            Do you want to adjust Kyle [name] diabetes meds?

## 2012-09-06 NOTE — Telephone Encounter (Signed)
I spoke with Kyle [name] and gave him the below recommendations.

## 2012-09-15 ENCOUNTER — Encounter (HOSPITAL_COMMUNITY): Payer: Self-pay | Admitting: Pharmacy Technician

## 2012-09-20 ENCOUNTER — Encounter (HOSPITAL_COMMUNITY): Payer: Medicaid Other | Attending: Oncology

## 2012-09-20 DIAGNOSIS — D649 Anemia, unspecified: Secondary | ICD-10-CM | POA: Insufficient documentation

## 2012-09-20 DIAGNOSIS — D6959 Other secondary thrombocytopenia: Secondary | ICD-10-CM | POA: Insufficient documentation

## 2012-09-20 DIAGNOSIS — I8500 Esophageal varices without bleeding: Secondary | ICD-10-CM | POA: Insufficient documentation

## 2012-09-20 DIAGNOSIS — D509 Iron deficiency anemia, unspecified: Secondary | ICD-10-CM | POA: Insufficient documentation

## 2012-09-20 DIAGNOSIS — K922 Gastrointestinal hemorrhage, unspecified: Secondary | ICD-10-CM | POA: Insufficient documentation

## 2012-09-20 LAB — COMPREHENSIVE METABOLIC PANEL
CO2: 23 mEq/L (ref 19–32)
Calcium: 9.8 mg/dL (ref 8.4–10.5)
Chloride: 97 mEq/L (ref 96–112)
Creatinine, Ser: 0.93 mg/dL (ref 0.50–1.35)
GFR calc Af Amer: >90 mL/min (ref >90)
GFR calc non Af Amer: >90 mL/min (ref >90)
Glucose, Bld: 293 mg/dL — ABNORMAL HIGH (ref 70–99)
Total Bilirubin: 0.9 mg/dL (ref 0.3–1.2)

## 2012-09-20 LAB — DIFFERENTIAL
Basophils Absolute: 0 10*3/uL (ref 0.0–0.1)
Eosinophils Relative: 2 % (ref 0–5)
Lymphocytes Relative: 30 % (ref 12–46)
Neutro Abs: 2.5 10*3/uL (ref 1.7–7.7)
Neutrophils Relative %: 57 % (ref 43–77)

## 2012-09-20 LAB — CBC
HCT: 40.8 % (ref 39.0–52.0)
Hemoglobin: 13.8 g/dL (ref 13.0–17.0)
MCH: 31.4 pg (ref 26.0–34.0)
MCV: 92.7 fL (ref 78.0–100.0)
RBC: 4.4 MIL/uL (ref 4.22–5.81)

## 2012-09-20 NOTE — Progress Notes (Signed)
Labs drawn today for cbc/diff,cmp,ferr 

## 2012-09-21 ENCOUNTER — Other Ambulatory Visit (HOSPITAL_COMMUNITY): Payer: Self-pay | Admitting: Oncology

## 2012-09-21 ENCOUNTER — Other Ambulatory Visit (HOSPITAL_COMMUNITY): Payer: Medicaid Other

## 2012-09-21 DIAGNOSIS — D509 Iron deficiency anemia, unspecified: Secondary | ICD-10-CM

## 2012-09-21 LAB — FERRITIN: Ferritin: 56 ng/mL (ref 22–322)

## 2012-09-22 ENCOUNTER — Other Ambulatory Visit (HOSPITAL_COMMUNITY): Payer: Self-pay | Admitting: *Deleted

## 2012-09-24 ENCOUNTER — Ambulatory Visit (HOSPITAL_COMMUNITY): Payer: Medicaid Other | Admitting: Oncology

## 2012-09-28 ENCOUNTER — Encounter (HOSPITAL_COMMUNITY): Payer: Medicaid Other | Attending: Oncology | Admitting: Oncology

## 2012-09-28 VITALS — BP 128/72 | HR 71 | Temp 97.8°F | Resp 20 | Wt 234.9 lb

## 2012-09-28 DIAGNOSIS — D509 Iron deficiency anemia, unspecified: Secondary | ICD-10-CM | POA: Insufficient documentation

## 2012-09-28 DIAGNOSIS — D649 Anemia, unspecified: Secondary | ICD-10-CM | POA: Insufficient documentation

## 2012-09-28 DIAGNOSIS — D6959 Other secondary thrombocytopenia: Secondary | ICD-10-CM | POA: Insufficient documentation

## 2012-09-28 DIAGNOSIS — I8500 Esophageal varices without bleeding: Secondary | ICD-10-CM | POA: Insufficient documentation

## 2012-09-28 DIAGNOSIS — D6489 Other specified anemias: Secondary | ICD-10-CM

## 2012-09-28 DIAGNOSIS — K7460 Unspecified cirrhosis of liver: Secondary | ICD-10-CM

## 2012-09-28 NOTE — Progress Notes (Signed)
Kyle Baseman, MD [address] [place] [address]  1. ANEMIA-IRON DEFICIENCY   2. HEPATIC CIRRHOSIS   3. Thrombocytopenia due to hypersplenism   4. Anemia due to multiple mechanisms   5. Esophageal varices     CURRENT THERAPY:PRN Feraheme IV infusions  INTERVAL HISTORY: Kyle [name] 56 y.o. male returns for  regular  visit for followup of anemia of multiple mechanisms   Kyle [name] is undergoing [facility] liver evaluation for [name]/cirrhosis of liver. He recently had diarrhea in the setting of C. difficile which has now been treated. According to Dr. Luvenia Starch note dated 09/03/2012 Kyle [name] is experiencing some postinfectious-year-old bowel syndrome. [facility] wishes the patient to have a diagnostic colonoscopy and Dr. Jena Gauss has offered the patient this procedure. Kyle [name] is agreeable to undergo this procedure. And Dr. Jena Gauss (disease GI) is encouraging him strongly to keep his appointment at [facility] for followup.  I personally reviewed and went over laboratory results with the patient.  We reviewed the patient's laboratory work. The patient's platelets remain low but stable. His white blood cell count is within normal limits most recently. His hemoglobin is stable. His ferritin however is decreasing. His last Feraheme infusion was on 07/01/2012. His ferritin responded appropriately and increased from 24-142. He is now down to 56. Therefore, we'll schedule him for a Feraheme infusion. This has been scheduled for 10/12/2012.  The patient reports that he is scheduled to undergo a colonoscopy by Dr. Jena Gauss tomorrow. He will begin his prep this afternoon. He reports that he will followup with [facility] as scheduled, but he is now looking forward to it.  The patient reports that he continues to have restless leg issues. He has try gabapentin tramadol. Both worked initially and then his restless leg syndrome recurred. He reports that another physician has reported to him that there is a cream that he can place on his lower  tremor these to help with this but the position was unaware of the name of this medication. He only cream or ointment that I can think of is Capsaicin cream.  He may apply this at our sleep or up to 4 times a day as needed. He was provide some education regarding this over-the-counter medications. He reports that he is willing to give this a try. I provided patient with diffuse gloves to help with the application of this medication.  The patient denies any complaints. Complete ROS questioning is negative.  He continues to smoke one pack per day of cigarettes. Smoking cessation education was provided the patient today. He is not interested in quitting tobacco abuse.    Past Medical History  Diagnosis Date  . DM (diabetes mellitus)   . Cirrhosis     bx proven steatohepatitis with cirrhosis (2010); per note from Feb 2011, received Hep A and B vaccines in 2010  . HTN (hypertension)   . RLS (restless legs syndrome)   . Sleep apnea   . Hyperlipidemia   . IDA (iron deficiency anemia)   . GERD (gastroesophageal reflux disease)   . Depression   . Peripheral neuropathy   . Urothelial cancer     2010, paillary low-grade, h/o recurrence 2011  . B12 deficiency   . Psoriasis   . 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 Anemia due to multiple mechanisms 05/13/2011  . History of alcohol abuse 05/13/2011  . ANEMIA-IRON DEFICIENCY 03/09/2009  . S/P endoscopy May 2012    4 columns grade II esophageal varices; due for repeat in Nov 2013   .  S/P colonoscopy May 2012    Tubular adenoma  . Low back pain   . Cancer     bladder ca 05/2009 removal and  w/chemo wash  . Esophageal varices with bleeding 11/24/11    s/p emergent EGD 11/25/11 by Dr. Rhea Belton at [facility], Grade III esophageal varices s/p banding X 5  . Small bowel obstruction 02/15/2012    Admitted to [facility], managed by Dr. Leticia Penna, ventral hernia manually reduced  . Ventral hernia 02/15/12  . MRSA (methicillin resistant Staphylococcus aureus)   .  Acute post-ligation esophageal ulcer with hemorrhage 04/11/2012  . Sleep apnea   . Clostridium difficile infection   . Cirrhosis of liver without mention of alcohol     has received hep A/hep B vaccines, afp-03/26/11= <1.3, ct abd 06/25/12= cirrhosis and portal venous hypertension with hepatomegaly    has BLADDER CANCER; DIABETES MELLITUS, TYPE II, CONTROLLED, WITH COMPLICATIONS; ADRENAL MASS; HYPERLIPIDEMIA; ANEMIA-IRON DEFICIENCY; ANEMIA, VITAMIN B12 DEFICIENCY; SMOKER; DEPRESSION; SLEEP APNEA, OBSTRUCTIVE, MODERATE; RESTLESS LEG SYNDROME; HYPERTENSION; GERD; HEPATIC CIRRHOSIS; Other chronic nonalcoholic liver disease; ARTHRITIS; SHOULDER PAIN, LEFT; INSOMNIA; FATIGUE; WEIGHT GAIN; CHEST DISCOMFORT; PROTEINURIA; ABNORMAL ELECTROCARDIOGRAM; Thrombocytopenia due to hypersplenism; Anemia due to multiple mechanisms; History of alcohol abuse; Iron deficiency anemia; Abdominal pain; External hemorrhoid; Upper GI bleed; Hypotension; Diarrhea; Esophageal varices; Umbilical hernia; History of small bowel obstruction; Acute post-ligation esophageal ulcer with hemorrhage; Acute posthemorrhagic anemia; and Acute respiratory failure on his problem list.     is allergic to ibuprofen and tylenol.  Kyle [name] does not currently have medications on file.  Past Surgical History  Procedure Date  . Carpel tunnel   . Shoulder surgery   . Adrenal mass surgery 05/2009    benign, left  . Bladder surgery 01/2009 and 06/2010    cancer 2010, small recurrence in 06/2010  . Colonoscopy 04/2009    moderate int hemorrhoids, rare sigmoid diverticula, one mm sessile hyperplastic rectal polyp  . Esophagogastroduodenoscopy 03/2009    small hh  . Small bowel capsule endoscopy 03/2009    couple of small benign appearing erosions, nonbleeding  . Egd/tcs 08/2007    small hiatal hernia, pancolonic diverticula, friable anal canal, 3cm salmon colored epithelium in distal esophagus, bx negative for Barrett's  . Skin cancer excision Oct  2012    left arm  . Esophagogastroduodenoscopy 11/25/2011    Dr. Vedia Coffer III varices in the mid and distal esophagus, banding placed, portal astropathy  . Esophagogastroduodenoscopy 01/14/2012    Dr Rourk->4-5 columns Gr2 varices, 6 bands placed, HH, distal esophageal ulcer, portal gastropathy, antral erosions  . Esophagogastroduodenoscopy 03/17/2012    Dr. Rito Ehrlich varices status post band ligation. Hiatal hernia. Portal gastropathy.  . Umbilical hernia repair 04/05/2012    Procedure: HERNIA REPAIR UMBILICAL ADULT;  Surgeon: Fabio Bering, MD;  Location: [facility];  Service: General;  Laterality: N/A;  . Esophagogastroduodenoscopy 04/11/2012    Dr. Lysle Pearl ulcer possibly at previous banding sites with stigmata     of bleeding.  Attempt at hemostasis with hemoclip and banding was not successful resulting in recurrence of bleed/  Portal gastropathy, but no evidence of gastric varices/ Recurrent esophageal varices grade 2.  . Esophagogastroduodenoscopy 05/24/2012    Dr. Rito Ehrlich varices, portal gastropathy  . Esophagogastroduodenoscopy 04/11/12    Dr. Lorella Nimrod in the distal esophagus adherent lot and visible vessel treated with bicap. grade I varices in the distal esophagus, one ligating band placed, portal hypertensive gastropathy in the body of the stomach.    Denies any headaches, dizziness, double  vision, fevers, chills, night sweats, nausea, vomiting, diarrhea, constipation, chest pain, heart palpitations, shortness of breath, blood in stool, black tarry stool, urinary pain, urinary burning, urinary frequency, hematuria.   PHYSICAL EXAMINATION  ECOG PERFORMANCE STATUS: 1 - Symptomatic but completely ambulatory  Filed Vitals:   09/28/12 1100  BP: 128/72  Pulse: 71  Temp: 97.8 F (36.6 C)  Resp: 20    GENERAL:alert, no distress, well developed, comfortable, cooperative, obese and smiling SKIN: skin color, texture, turgor are normal, no rashes or  significant lesions HEAD: Normocephalic, No masses, lesions, tenderness or abnormalities EYES: normal, Conjunctiva are pink and non-injected EARS: External ears normal OROPHARYNX:lips, buccal mucosa, and tongue normal and mucous membranes are moist  NECK: supple, trachea midline LYMPH:  no palpable lymphadenopathy BREAST:not examined LUNGS: clear to auscultation and percussion HEART: regular rate & rhythm, no murmurs, no gallops, S1 normal and S2 normal ABDOMEN:obese and normal bowel sounds BACK: Back symmetric, no curvature. EXTREMITIES:less then 2 second capillary refill, no skin discoloration  NEURO: alert & oriented x 3 with fluent speech, no focal motor/sensory deficits, gait normal    LABORATORY DATA: Results for HARLAND, AGUINIGA (MRN [id number]) as of 09/28/2012 11:14  Ref. Range 07/30/2012 10:26 08/09/2012 06:45 09/20/2012 10:33  Ferritin Latest Range: 22-322 ng/mL 142  56  WBC Latest Range: 4.0-10.5 K/uL 5.2  4.5  RBC Latest Range: 4.22-5.81 MIL/uL 4.70  4.40  Hemoglobin Latest Range: 13.0-17.0 g/dL 08.6  57.8  HCT Latest Range: 39.0-52.0 % 43.1  40.8  MCV Latest Range: 78.0-100.0 fL 91.7  92.7  MCH Latest Range: 26.0-34.0 pg 31.3  31.4  MCHC Latest Range: 30.0-36.0 g/dL 46.9  62.9  RDW Latest Range: 11.5-15.5 % 16.1 (H)  14.4  Platelets Latest Range: 150-400 K/uL 61 (L)  55 (L)      Chemistry      Component Value Date/Time   NA 132* 09/20/2012 1033   K 4.5 09/20/2012 1033   CL 97 09/20/2012 1033   CO2 23 09/20/2012 1033   BUN 17 09/20/2012 1033   CREATININE 0.93 09/20/2012 1033   CREATININE 1.03 11/11/2011 1335      Component Value Date/Time   CALCIUM 9.8 09/20/2012 1033   ALKPHOS 81 09/20/2012 1033   AST 60* 09/20/2012 1033   ALT 58* 09/20/2012 1033   BILITOT 0.9 09/20/2012 1033         ASSESSMENT:  1. Multifactorial anemia, last Feraheme 1020 IV infusion was on 05/10/2012.  2. Iron deficiency anemia  3. Liver disease with fatty infiltration  4. Obesity    5. Probably lung disease secondary to longstanding smoking history.  6. DM 7. Esophageal varices 8. RLS, failed Tramadol and Gabapentin.   PLAN:  1. I personally reviewed and went over laboratory results with the patient. 2. Smoking cessation education provided. Patient is not interested in tobacco cessation. 3. Lab work every 6 weeks: CBC diff, Ferritin 4. Colonoscopy tomorrow as scheduled.  5. Follow-up with [facility] for [name]/Cirrhosis as scheduled.  6. Feraheme infusion 1020 mg IV on 10/12/2012.  Order is signed and held. 7. Recommended a trial of Capsaicin cream/ointment to LE.  I provided the patient with some gloves to help with application.  He may apply at [facility] for RLS or up to 4 times per day. 8. Return in 4 months for follow-up.    All questions were answered. The patient knows to call the clinic with any problems, questions or concerns. We can certainly see the patient much sooner if necessary.  The patient and plan discussed with Glenford Peers, MD and he is in agreement with the aforementioned.  Gurjot Brisco

## 2012-09-28 NOTE — Patient Instructions (Addendum)
[facility]  Discharge Instructions  RECOMMENDATIONS MADE BY THE CONSULTANT AND ANY TEST RESULTS WILL BE SENT TO YOUR REFERRING DOCTOR.  Keep appointment for [facility] as scheduled. Lab work every 6 weeks. Return in 4 months to see MD.  Report any issues/concerns to clinic as needed prior to appointments.  I acknowledge that I have been informed and understand all the instructions given to me and received a copy. I do not have any more questions at this time, but understand that I may call the Specialty Clinic at [facility] at [phone] during business hours should I have any further questions or need assistance in obtaining follow-up care.    __________________________________________  _____________  __________ Signature of Patient or Authorized Representative            Date                   Time    __________________________________________ Nurse's Signature

## 2012-09-29 ENCOUNTER — Ambulatory Visit (HOSPITAL_COMMUNITY)
Admission: RE | Admit: 2012-09-29 | Discharge: 2012-09-29 | Disposition: A | Discharge status: Home / Self Care | Payer: Medicaid Other | Source: Ambulatory Visit | Attending: Internal Medicine | Admitting: Internal Medicine

## 2012-09-29 ENCOUNTER — Encounter (HOSPITAL_COMMUNITY)
Admission: RE | Disposition: A | Discharge status: Home / Self Care | Payer: Self-pay | Source: Ambulatory Visit | Attending: Internal Medicine

## 2012-09-29 ENCOUNTER — Encounter (HOSPITAL_COMMUNITY): Payer: Self-pay | Admitting: *Deleted

## 2012-09-29 DIAGNOSIS — K649 Unspecified hemorrhoids: Secondary | ICD-10-CM | POA: Insufficient documentation

## 2012-09-29 DIAGNOSIS — Z860100 Personal history of colon polyps, unspecified: Secondary | ICD-10-CM

## 2012-09-29 DIAGNOSIS — E119 Type 2 diabetes mellitus without complications: Secondary | ICD-10-CM | POA: Insufficient documentation

## 2012-09-29 DIAGNOSIS — I10 Essential (primary) hypertension: Secondary | ICD-10-CM | POA: Insufficient documentation

## 2012-09-29 DIAGNOSIS — K7460 Unspecified cirrhosis of liver: Secondary | ICD-10-CM | POA: Insufficient documentation

## 2012-09-29 DIAGNOSIS — R197 Diarrhea, unspecified: Secondary | ICD-10-CM

## 2012-09-29 DIAGNOSIS — K5730 Diverticulosis of large intestine without perforation or abscess without bleeding: Secondary | ICD-10-CM

## 2012-09-29 DIAGNOSIS — D126 Benign neoplasm of colon, unspecified: Secondary | ICD-10-CM | POA: Insufficient documentation

## 2012-09-29 DIAGNOSIS — K219 Gastro-esophageal reflux disease without esophagitis: Secondary | ICD-10-CM | POA: Insufficient documentation

## 2012-09-29 HISTORY — PX: COLONOSCOPY: SHX5424

## 2012-09-29 SURGERY — COLONOSCOPY
Anesthesia: Moderate Sedation

## 2012-09-29 MED ORDER — MIDAZOLAM HCL 5 MG/5ML IJ SOLN
INTRAMUSCULAR | Status: DC | PRN
  Administered 2012-09-29: 2 mg via INTRAVENOUS

## 2012-09-29 MED ORDER — SODIUM CHLORIDE 0.45 % IV SOLN
INTRAVENOUS | Status: DC
  Administered 2012-09-29: 1000 mL via INTRAVENOUS

## 2012-09-29 MED ORDER — MIDAZOLAM HCL 5 MG/5ML IJ SOLN
INTRAMUSCULAR | Status: AC
  Filled 2012-09-29: qty 10

## 2012-09-29 MED ORDER — MEPERIDINE HCL 100 MG/ML IJ SOLN
INTRAMUSCULAR | Status: AC
  Filled 2012-09-29: qty 1

## 2012-09-29 MED ORDER — MEPERIDINE HCL 100 MG/ML IJ SOLN
INTRAMUSCULAR | Status: DC | PRN
Start: 2012-09-29 — End: 2012-09-29
  Administered 2012-09-29: 50 mg via INTRAVENOUS

## 2012-09-29 MED ORDER — STERILE WATER FOR IRRIGATION IR SOLN
Status: DC | PRN
  Administered 2012-09-29: 09:00:00

## 2012-09-29 NOTE — Op Note (Signed)
[facility] [address] [place], [address]   COLONOSCOPY PROCEDURE REPORT  PATIENT: Kyle [name], Kyle [name]  MR#:         [id number] BIRTHDATE: 10-01-1956 , 56  yrs. old GENDER: Male ENDOSCOPIST: R.  Roetta Sessions, MD FACP FACG REFERRED BY:  Leodis Sias, M.D. PROCEDURE DATE:  09/29/2012 PROCEDURE:     Colonoscopy biopsy  INDICATIONS: History of colonic adenoma; poor prep prior colonoscopy  INFORMED CONSENT:  The risks, benefits, alternatives and imponderables including but not limited to bleeding, perforation as well as the possibility of a missed lesion have been reviewed.  The potential for biopsy, lesion removal, etc. have also been discussed.  Questions have been answered.  All parties agreeable. Please see the history and physical in the medical record for more information.  MEDICATIONS: Versed 2 mg IV and Demerol 50 mg IV  DESCRIPTION OF PROCEDURE:  After a digital rectal exam was performed, the EC-[id number] ([id number])  colonoscope was advanced from the anus through the rectum and colon to the area of the cecum, ileocecal valve and appendiceal orifice.  The cecum was deeply intubated.  These structures were well-seen and photographed for the record.  From the level of the cecum and ileocecal valve, the scope was slowly and cautiously withdrawn.  The mucosal surfaces were carefully surveyed utilizing scope tip deflection to facilitate fold flattening as needed.  The scope was pulled down into the rectum where a thorough examination including retroflexion was performed.    FINDINGS:  Suboptimal preparation. Patient has multiple rectal varices; overlying mucosa appeared normal. Few scattered pancolonic diverticula; single diminutive polyp in the base of the cecum. The remainder of the colonic mucosa appeared normal. Normal distal 10 cm of terminal ileal mucosa  THERAPEUTIC / DIAGNOSTIC MANEUVERS PERFORMED: The cecal polyp was cold  biopsied/removed  COMPLICATIONS: None  CECAL WITHDRAWAL TIME:  at 15 minutes  IMPRESSION:  Rectal varices. Scattered pancolonic diverticula; single cecal polyp-removed as described above  RECOMMENDATIONS: Followup on pathology. Further recommendations to follow   _______________________________ eSigned:  R. Roetta Sessions, MD FACP [facility] 09/29/2012 9:16 AM   CC:    PATIENT NAME:  [name], [name] MR#: [id number]

## 2012-09-29 NOTE — Interval H&P Note (Signed)
History and Physical Interval Note:  09/29/2012 8:40 AM  Kyle [name]  has presented today for surgery, with the diagnosis of DIARRHEA  The various methods of treatment have been discussed with the patient and family. After consideration of risks, benefits and other options for treatment, the patient has consented to  Procedure(s) (LRB) with comments: COLONOSCOPY (N/A) - 8:30 am as a surgical intervention .  The patient's history has been reviewed, patient examined, no change in status, stable for surgery.  I have reviewed the patient's chart and labs.  Questions were answered to the patient's satisfaction.     Robert Rourk  Recent CBC demonstrates a normal white count 1 with hemoglobin and hematocrit. Platelet count low at 55,000. Ferritin normal at 56. The colonoscopy today per plan.The risks, benefits, limitations, alternatives and imponderables have been reviewed with the patient. Questions have been answered. All parties are agreeable.

## 2012-09-29 NOTE — H&P (View-Only) (Signed)
Primary Care Physician:  Redmond Baseman, MD Primary Gastroenterologist:  Dr. Jena Gauss  Pre-Procedure History & Physical: HPI:  Kyle [name] is a 56 y.o. male here for followup of [name]/cirrhosis. Has been seen at [facility]. Liver evaluation is in process. Patient really needs a TIPS because he is failed banding in the setting of recurrent GI bleeds. Dose of Inderal has been increased. Recent C. difficile diarrhea-recurrent. He now has protracted episodes of postprandial abdominal cramps and nonbloody diarrhea. Patient does report normal bowel function for days in between bouts of diarrhea. States until did not help. Clostridium difficile PCR negative on September 16. [facility] wants patient have a colonoscopy. Patient wants to have it down here with me. EGD to be done at [facility]. Patient had a colonoscopy your half ago here in the setting of a poor prep 1 adenoma removed.  Past Medical History  Diagnosis Date  . DM (diabetes mellitus)   . Cirrhosis     bx proven steatohepatitis with cirrhosis (2010); per note from Feb 2011, received Hep A and B vaccines in 2010  . HTN (hypertension)   . RLS (restless legs syndrome)   . Sleep apnea   . Hyperlipidemia   . IDA (iron deficiency anemia)   . GERD (gastroesophageal reflux disease)   . Depression   . Peripheral neuropathy   . Urothelial cancer     2010, paillary low-grade, h/o recurrence 2011  . B12 deficiency   . Psoriasis   . 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 Anemia due to multiple mechanisms 05/13/2011  . History of alcohol abuse 05/13/2011  . ANEMIA-IRON DEFICIENCY 03/09/2009  . S/P endoscopy May 2012    4 columns grade II esophageal varices; due for repeat in Nov 2013   . S/P colonoscopy May 2012    Tubular adenoma  . Low back pain   . Cancer     bladder ca 05/2009 removal and  w/chemo wash  . Esophageal varices with bleeding 11/24/11    s/p emergent EGD 11/25/11 by Dr. Rhea Belton at [facility], Grade III esophageal varices  s/p banding X 5  . Small bowel obstruction 02/15/2012    Admitted to [facility], managed by Dr. Leticia Penna, ventral hernia manually reduced  . Ventral hernia 02/15/12  . MRSA (methicillin resistant Staphylococcus aureus)   . Acute post-ligation esophageal ulcer with hemorrhage 04/11/2012  . Sleep apnea   . Clostridium difficile infection   . Cirrhosis of liver without mention of alcohol     has received hep A/hep B vaccines, afp-03/26/11= <1.3, ct abd 06/25/12= cirrhosis and portal venous hypertension with hepatomegaly    Past Surgical History  Procedure Date  . Carpel tunnel   . Shoulder surgery   . Adrenal mass surgery 05/2009    benign, left  . Bladder surgery 01/2009 and 06/2010    cancer 2010, small recurrence in 06/2010  . Colonoscopy 04/2009    moderate int hemorrhoids, rare sigmoid diverticula, one mm sessile hyperplastic rectal polyp  . Esophagogastroduodenoscopy 03/2009    small hh  . Small bowel capsule endoscopy 03/2009    couple of small benign appearing erosions, nonbleeding  . Egd/tcs 08/2007    small hiatal hernia, pancolonic diverticula, friable anal canal, 3cm salmon colored epithelium in distal esophagus, bx negative for Barrett's  . Skin cancer excision Oct 2012    left arm  . Esophagogastroduodenoscopy 11/25/2011    Dr. Vedia Coffer III varices in the mid and distal esophagus, banding placed, portal astropathy  . Esophagogastroduodenoscopy  01/14/2012    Dr Tushar Enns->4-5 columns Gr2 varices, 6 bands placed, HH, distal esophageal ulcer, portal gastropathy, antral erosions  . Esophagogastroduodenoscopy 03/17/2012    Dr. Rito Ehrlich varices status post band ligation. Hiatal hernia. Portal gastropathy.  . Umbilical hernia repair 04/05/2012    Procedure: HERNIA REPAIR UMBILICAL ADULT;  Surgeon: Fabio Bering, MD;  Location: [facility];  Service: General;  Laterality: N/A;  . Esophagogastroduodenoscopy 04/11/2012    Dr. Lysle Pearl ulcer possibly at previous banding sites with stigmata      of bleeding.  Attempt at hemostasis with hemoclip and banding was not successful resulting in recurrence of bleed/  Portal gastropathy, but no evidence of gastric varices/ Recurrent esophageal varices grade 2.  . Esophagogastroduodenoscopy 05/24/2012    Dr. Rito Ehrlich varices, portal gastropathy  . Esophagogastroduodenoscopy 04/11/12    Dr. Lorella Nimrod in the distal esophagus adherent lot and visible vessel treated with bicap. grade I varices in the distal esophagus, one ligating band placed, portal hypertensive gastropathy in the body of the stomach.    Prior to Admission medications   Medication Sig Start Date End Date Taking? Authorizing Provider  dicyclomine (BENTYL) 10 MG capsule Take 1 capsule (10 mg total) by mouth 3 (three) times daily as needed. For cramps/diarrhea 08/13/12 08/13/13 Yes Tiffany Kocher, PA  fish oil-omega-3 fatty acids 1000 MG capsule Take 1 g by mouth 3 (three) times daily.    Yes Historical Provider, MD  furosemide (LASIX) 20 MG tablet Take 20 mg by mouth daily.    Yes Historical Provider, MD  gabapentin (NEURONTIN) 300 MG capsule Take 600-900 mg by mouth 2 (two) times daily. Pt takes 300 mg    Two capsules  Twice daily   Yes Historical Provider, MD  insulin aspart (NOVOLOG) 100 UNIT/ML injection Inject 25-35 Units into the skin 3 (three) times daily before meals. Sliding Scale   Yes Historical Provider, MD  insulin glargine (LANTUS) 100 UNIT/ML injection Inject 60 Units into the skin 2 (two) times daily.    Yes Historical Provider, MD  lisinopril (PRINIVIL,ZESTRIL) 20 MG tablet Take 10 mg by mouth daily.    Yes Historical Provider, MD  magnesium oxide (MAG-OX) 400 MG tablet Take 400 mg by mouth daily.   Yes Historical Provider, MD  metFORMIN (GLUCOPHAGE) 1000 MG tablet Take 1,000 mg by mouth 2 (two) times daily with a meal.   Yes Historical Provider, MD  Multiple Vitamin (MULTIVITAMIN) capsule Take 1 capsule by mouth daily.     Yes Historical Provider, MD    omeprazole (PRILOSEC) 40 MG capsule Take 40 mg by mouth daily.   Yes Historical Provider, MD  oxyCODONE (OXY IR/ROXICODONE) 5 MG immediate release tablet Take 5 mg by mouth every 4 (four) hours as needed.   Yes Historical Provider, MD  pramipexole (MIRAPEX) 0.5 MG tablet Take 0.5 mg by mouth at bedtime.    Yes Historical Provider, MD  propranolol (INDERAL) 80 MG tablet Take 1 tablet (80 mg total) by mouth 2 (two) times daily. 05/28/12  Yes Joselyn Arrow, NP  simvastatin (ZOCOR) 20 MG tablet Take 20 mg by mouth every evening.   Yes Historical Provider, MD  traZODone (DESYREL) 50 MG tablet Take 100 mg by mouth at bedtime.    Yes Historical Provider, MD  hyoscyamine (LEVSIN SL) 0.125 MG SL tablet Place 2 tablets (0.25 mg total) under the tongue every 4 (four) hours as needed for cramping. 07/17/11 07/27/11  Burgess Amor, PA  hyoscyamine (LEVSIN SL) 0.125 MG SL tablet Place  1 tablet (0.125 mg total) under the tongue every 4 (four) hours as needed for cramping. 07/08/12 07/18/12  Nira Retort, NP    Allergies as of 09/03/2012 - Review Complete 09/03/2012  Allergen Reaction Noted  . Ibuprofen Other (See Comments) 05/13/2012  . Tylenol (acetaminophen) Other (See Comments) 07/17/2011    Family History  Problem Relation Age of Onset  . Cirrhosis Father     etoh  . Colon cancer Neg Hx   . Anesthesia problems Neg Hx   . Hypotension Neg Hx   . Malignant hyperthermia Neg Hx   . Pseudochol deficiency Neg Hx   . Kidney cancer Mother   . Cancer Mother   . HIV Brother   . Cirrhosis Brother     nash    History   Social History  . Marital Status: Married    Spouse Name: N/A    Number of Children: 3  . Years of Education: N/A   Occupational History  . disabled    Social History Main Topics  . Smoking status: Current Every Day Smoker -- 1.5 packs/day for 30 years    Types: Cigarettes  . Smokeless tobacco: Not on file  . Alcohol Use: No     drank heavily for few years in 20s  . Drug Use: No  .  Sexually Active: Yes    Birth Control/ Protection: None   Other Topics Concern  . Not on file   Social History Narrative  . No narrative on file    Review of Systems: See HPI, otherwise negative ROS  Physical Exam: BP 129/72  Pulse 73  Temp 98.4 F (36.9 C) (Temporal)  Ht 5\' 7"  (1.702 m)  Wt 236 lb (107.049 kg)  BMI 36.96 kg/m2 General:   Alert,  Well-developed, well-nourished, pleasant and cooperative in NAD Skin:  Intact without significant lesions or rashes.  Malar telangiectasias present Eyes:  Sclera clear, no icterus.   Conjunctiva pink. Ears:  Normal auditory acuity. Nose:  No deformity, discharge,  or lesions. Mouth:  No deformity or lesions. Neck:  Supple; no masses or thyromegaly. No significant cervical adenopathy. Lungs:  Clear throughout to auscultation.   No wheezes, crackles, or rhonchi. No acute distress. Heart:  Regular rate and rhythm; no murmurs, clicks, rubs,  or gallops. Abdomen:  Obese. Recent umbilical hernia repair -well healing abdomen is soft and nontender. Liver edge 4 fingers below right costal margin. No shifting dullness or fluid wave  Pulses:  Normal pulses noted. Extremities:  Without clubbing or edema.  Impression/Plan:  [name]/cirrhosis [facility] liver evaluation underway. Recent protracted diarrhea in the setting of known C. difficile. Has been treated. 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 Patient has been on metformin for years predating any issues with diarrhea - Doubt this agent is a contributing factor. [facility] wishes patient to have have a diagnostic colonoscopy which is not unreasonable this time. I have offered this gentleman  a diagnostic colonoscopy in the near future.The risks, benefits, limitations, alternatives and imponderables have been reviewed with the patient. Questions have been answered. All parties are agreeable.  Continue Inderal.  Trial of Levsin 0.125 mg one sublingually a.c. and at bedtime when  necessary abdominal cramps and diarrhea.  Patient strongly urged to keep all appointments with [facility] and  followup with their recommendations.

## 2012-10-03 ENCOUNTER — Encounter: Payer: Self-pay | Admitting: Internal Medicine

## 2012-10-05 ENCOUNTER — Encounter (HOSPITAL_COMMUNITY): Payer: Self-pay | Admitting: Internal Medicine

## 2012-10-05 ENCOUNTER — Encounter: Payer: Self-pay | Admitting: *Deleted

## 2012-10-05 LAB — GLUCOSE, CAPILLARY: Glucose-Capillary: 178 mg/dL — ABNORMAL HIGH (ref 70–99)

## 2012-10-12 ENCOUNTER — Encounter (HOSPITAL_BASED_OUTPATIENT_CLINIC_OR_DEPARTMENT_OTHER): Payer: Medicaid Other

## 2012-10-12 VITALS — BP 123/77 | HR 78 | Temp 98.2°F | Resp 18

## 2012-10-12 DIAGNOSIS — D509 Iron deficiency anemia, unspecified: Secondary | ICD-10-CM

## 2012-10-12 MED ORDER — SODIUM CHLORIDE 0.9 % IJ SOLN
10.0000 mL | INTRAMUSCULAR | Status: DC | PRN
  Filled 2012-10-12: qty 10

## 2012-10-12 MED ORDER — SODIUM CHLORIDE 0.9 % IV SOLN
Freq: Once | INTRAVENOUS | Status: AC
  Administered 2012-10-12: 14:00:00 via INTRAVENOUS

## 2012-10-12 MED ORDER — FERUMOXYTOL INJECTION 510 MG/17 ML
1020.0000 mg | Freq: Once | INTRAVENOUS | Status: AC
  Administered 2012-10-12: 1020 mg via INTRAVENOUS
  Filled 2012-10-12: qty 34

## 2012-10-12 MED ORDER — SODIUM CHLORIDE 0.9 % IJ SOLN
INTRAMUSCULAR | Status: AC
  Filled 2012-10-12: qty 10

## 2012-10-12 NOTE — Progress Notes (Signed)
Tolerated well

## 2012-10-25 ENCOUNTER — Other Ambulatory Visit: Payer: Self-pay

## 2012-10-25 MED ORDER — PROPRANOLOL HCL 80 MG PO TABS: 80.0000 mg | ORAL_TABLET | Freq: Two times a day (BID) | ORAL | Status: DC

## 2012-11-09 ENCOUNTER — Other Ambulatory Visit (HOSPITAL_COMMUNITY): Payer: Medicaid Other

## 2012-11-10 ENCOUNTER — Encounter (HOSPITAL_COMMUNITY): Payer: Medicaid Other | Attending: Oncology

## 2012-11-10 DIAGNOSIS — I8500 Esophageal varices without bleeding: Secondary | ICD-10-CM | POA: Insufficient documentation

## 2012-11-10 DIAGNOSIS — K7460 Unspecified cirrhosis of liver: Secondary | ICD-10-CM

## 2012-11-10 DIAGNOSIS — D509 Iron deficiency anemia, unspecified: Secondary | ICD-10-CM | POA: Insufficient documentation

## 2012-11-10 DIAGNOSIS — D6959 Other secondary thrombocytopenia: Secondary | ICD-10-CM | POA: Insufficient documentation

## 2012-11-10 DIAGNOSIS — D649 Anemia, unspecified: Secondary | ICD-10-CM | POA: Insufficient documentation

## 2012-11-10 LAB — CBC WITH DIFFERENTIAL/PLATELET
Basophils Absolute: 0 10*3/uL (ref 0.0–0.1)
Basophils Relative: 0 % (ref 0–1)
MCHC: 34.2 g/dL (ref 30.0–36.0)
Neutro Abs: 3.4 10*3/uL (ref 1.7–7.7)
Neutrophils Relative %: 62 % (ref 43–77)
Platelets: 63 10*3/uL — ABNORMAL LOW (ref 150–400)
RDW: 14.9 % (ref 11.5–15.5)

## 2012-11-10 LAB — FERRITIN: Ferritin: 155 ng/mL (ref 22–322)

## 2012-11-10 NOTE — Progress Notes (Signed)
Labs drawn today for cbc/diff,ferr 

## 2012-11-29 ENCOUNTER — Telehealth: Payer: Self-pay | Admitting: Internal Medicine

## 2012-11-29 NOTE — Telephone Encounter (Signed)
Spoke with pt- he is requesting to be seen here to get his results from [facility]. The last ov and U/S and egd were faxed to [place]. There was no mention of pt being approved for TIPS procedure, only he needed to return to them for an egd with possible banding. Pt stated he did not have the money to return to [facility] right now and wants to follow up with [place]. Spoke with KJ- she advised I call [facility] and find out what the plan is for the pt. I called and had to leave voicemail for return call.

## 2012-11-29 NOTE — Telephone Encounter (Signed)
Pt called this afternoon. He said that he had called last week and was following up with [place]. He said that he is suppose to go to [facility] on 1/16 to discuss his results and can not afford the drive down there. His question was if we had to appropriate papers from [facility] could he come here and have those results discussed with him here instead. Please advise and call patient

## 2012-12-01 NOTE — Telephone Encounter (Signed)
I have called and left message at the Hep C clinic in [place] and Im waiting for a return call to see if the patient may transfer his care to them

## 2012-12-01 NOTE — Telephone Encounter (Signed)
Yes, let's do the stool studies. Can use the new test.

## 2012-12-01 NOTE — Telephone Encounter (Signed)
Tried to call [facility] again- LM on voicemail of Kandace Parkins to get pts POC and to call me back. Called pt to inform him of what was going on. He stated he was still having really bad cramps and diarrhea, just like before. He stated he got better for a short time then it came back again. He said the levsin doesn't work anymore so he quit taking it. Pt wants to know if there is anything else we can do about the diarrhea and cramps.  Please advise.  Pt also wants to know if he can get into the [facility] liver clinic in [place] so he doesn't have to drive so far. I told him I would have to check with [place]. Benedetto Goad, will you find out if pt can switch from [facility] to [place].

## 2012-12-01 NOTE — Telephone Encounter (Addendum)
Vicky from [facility] called- she stated she spoke with pt today and they are going to arrange for pt to come to [facility] next month and have his ov and appointment with the radiologist all on the same day so the pt doesn't have to keep going to [facility] all the time . Pt has been approved for the TIPS procedure and they are going to take care of all the details. He does not need another egd right now and they are going to fax all of their recommendations to [place] for review.  Pt still having diarrhea. Do you want to do any further stool studies or bring him in for ov?

## 2012-12-02 NOTE — Telephone Encounter (Signed)
Tried to call pt- LMOM 

## 2012-12-03 NOTE — Telephone Encounter (Signed)
Pt aware and will pick up container on Monday.

## 2012-12-16 MED ORDER — BIS SUBCIT-METRONID-TETRACYC 140-125-125 MG PO CAPS: 3.0000 | ORAL_CAPSULE | Freq: Three times a day (TID) | ORAL | Status: DC

## 2012-12-16 NOTE — Addendum Note (Signed)
Addended by: Nira Retort on: 12/16/2012 02:58 PM   Modules accepted: Orders

## 2012-12-16 NOTE — Telephone Encounter (Signed)
Received stool study report.   Negative for Cdiff, parasites, viruses, bacteria and toxins, lactoferrin negative.  However, incidentally noted H.pylori positive per stool collection and is apparently clarithromycin resistant. I don't see where he has a hx of H.pylori gastritis in the past.   Will need to treat with Pylera. I have sent in prescription. He needs to take Omeprazole BID during this course.   Colonoscopy recently unrevealing for diarrhea cause. 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 Is he still taking Levsin?

## 2012-12-16 NOTE — Telephone Encounter (Signed)
Tried to call pt- LMOM 

## 2012-12-17 ENCOUNTER — Other Ambulatory Visit: Payer: Self-pay

## 2012-12-17 ENCOUNTER — Encounter: Payer: Self-pay | Admitting: Internal Medicine

## 2012-12-17 MED ORDER — OMEPRAZOLE 40 MG PO CPDR: 40.0000 mg | DELAYED_RELEASE_CAPSULE | Freq: Every day | ORAL | Status: DC

## 2012-12-17 NOTE — Telephone Encounter (Signed)
Pt is aware of results. He is also aware of the Rx called in. He will need a ne Rx for the Omeprazole because he will run out because of having to double up on it.

## 2012-12-17 NOTE — Telephone Encounter (Signed)
Open in error

## 2012-12-21 ENCOUNTER — Encounter (HOSPITAL_COMMUNITY): Payer: Medicaid Other | Attending: Oncology

## 2012-12-21 DIAGNOSIS — K7460 Unspecified cirrhosis of liver: Secondary | ICD-10-CM | POA: Insufficient documentation

## 2012-12-21 DIAGNOSIS — D509 Iron deficiency anemia, unspecified: Secondary | ICD-10-CM

## 2012-12-21 DIAGNOSIS — D6959 Other secondary thrombocytopenia: Secondary | ICD-10-CM

## 2012-12-21 DIAGNOSIS — I8500 Esophageal varices without bleeding: Secondary | ICD-10-CM | POA: Insufficient documentation

## 2012-12-21 DIAGNOSIS — R197 Diarrhea, unspecified: Secondary | ICD-10-CM | POA: Insufficient documentation

## 2012-12-21 DIAGNOSIS — D649 Anemia, unspecified: Secondary | ICD-10-CM | POA: Insufficient documentation

## 2012-12-21 DIAGNOSIS — R109 Unspecified abdominal pain: Secondary | ICD-10-CM | POA: Insufficient documentation

## 2012-12-21 LAB — CBC WITH DIFFERENTIAL/PLATELET
Eosinophils Absolute: 0.1 10*3/uL (ref 0.0–0.7)
Lymphs Abs: 1.3 10*3/uL (ref 0.7–4.0)
MCH: 30.8 pg (ref 26.0–34.0)
Neutro Abs: 2.3 10*3/uL (ref 1.7–7.7)
Neutrophils Relative %: 56 % (ref 43–77)
Platelets: 70 10*3/uL — ABNORMAL LOW (ref 150–400)
RBC: 4.32 MIL/uL (ref 4.22–5.81)
WBC: 4.1 10*3/uL (ref 4.0–10.5)

## 2012-12-21 LAB — FERRITIN: Ferritin: 20 ng/mL — ABNORMAL LOW (ref 22–322)

## 2012-12-21 NOTE — Progress Notes (Signed)
Labs drawn today for cbc/diff,ferr 

## 2012-12-22 ENCOUNTER — Other Ambulatory Visit (HOSPITAL_COMMUNITY): Payer: Self-pay | Admitting: Oncology

## 2012-12-22 DIAGNOSIS — D509 Iron deficiency anemia, unspecified: Secondary | ICD-10-CM

## 2012-12-23 ENCOUNTER — Encounter (HOSPITAL_BASED_OUTPATIENT_CLINIC_OR_DEPARTMENT_OTHER): Payer: Medicaid Other

## 2012-12-23 ENCOUNTER — Inpatient Hospital Stay (HOSPITAL_COMMUNITY)
Admission: EM | Admit: 2012-12-23 | Discharge: 2012-12-26 | DRG: 368 | Disposition: A | Discharge status: Other Acute Inpatient Hospital | Payer: Medicaid Other | Attending: Internal Medicine | Admitting: Internal Medicine

## 2012-12-23 ENCOUNTER — Emergency Department (HOSPITAL_COMMUNITY): Payer: Medicaid Other

## 2012-12-23 ENCOUNTER — Encounter (HOSPITAL_COMMUNITY): Payer: Self-pay | Admitting: Emergency Medicine

## 2012-12-23 VITALS — BP 117/71 | HR 78 | Temp 97.9°F | Resp 16

## 2012-12-23 DIAGNOSIS — I8501 Esophageal varices with bleeding: Principal | ICD-10-CM | POA: Diagnosis present

## 2012-12-23 DIAGNOSIS — L408 Other psoriasis: Secondary | ICD-10-CM | POA: Diagnosis present

## 2012-12-23 DIAGNOSIS — D509 Iron deficiency anemia, unspecified: Secondary | ICD-10-CM

## 2012-12-23 DIAGNOSIS — R109 Unspecified abdominal pain: Secondary | ICD-10-CM

## 2012-12-23 DIAGNOSIS — Z9221 Personal history of antineoplastic chemotherapy: Secondary | ICD-10-CM

## 2012-12-23 DIAGNOSIS — E538 Deficiency of other specified B group vitamins: Secondary | ICD-10-CM | POA: Diagnosis present

## 2012-12-23 DIAGNOSIS — E118 Type 2 diabetes mellitus with unspecified complications: Secondary | ICD-10-CM | POA: Diagnosis present

## 2012-12-23 DIAGNOSIS — J690 Pneumonitis due to inhalation of food and vomit: Secondary | ICD-10-CM | POA: Diagnosis not present

## 2012-12-23 DIAGNOSIS — K219 Gastro-esophageal reflux disease without esophagitis: Secondary | ICD-10-CM | POA: Diagnosis present

## 2012-12-23 DIAGNOSIS — R197 Diarrhea, unspecified: Secondary | ICD-10-CM

## 2012-12-23 DIAGNOSIS — A048 Other specified bacterial intestinal infections: Secondary | ICD-10-CM | POA: Diagnosis present

## 2012-12-23 DIAGNOSIS — F17200 Nicotine dependence, unspecified, uncomplicated: Secondary | ICD-10-CM | POA: Diagnosis present

## 2012-12-23 DIAGNOSIS — Z8551 Personal history of malignant neoplasm of bladder: Secondary | ICD-10-CM

## 2012-12-23 DIAGNOSIS — I9589 Other hypotension: Secondary | ICD-10-CM | POA: Diagnosis present

## 2012-12-23 DIAGNOSIS — K922 Gastrointestinal hemorrhage, unspecified: Secondary | ICD-10-CM

## 2012-12-23 DIAGNOSIS — J9600 Acute respiratory failure, unspecified whether with hypoxia or hypercapnia: Secondary | ICD-10-CM | POA: Diagnosis present

## 2012-12-23 DIAGNOSIS — F329 Major depressive disorder, single episode, unspecified: Secondary | ICD-10-CM | POA: Diagnosis present

## 2012-12-23 DIAGNOSIS — D6959 Other secondary thrombocytopenia: Secondary | ICD-10-CM | POA: Diagnosis present

## 2012-12-23 DIAGNOSIS — Z79899 Other long term (current) drug therapy: Secondary | ICD-10-CM

## 2012-12-23 DIAGNOSIS — E875 Hyperkalemia: Secondary | ICD-10-CM | POA: Diagnosis present

## 2012-12-23 DIAGNOSIS — A419 Sepsis, unspecified organism: Secondary | ICD-10-CM | POA: Diagnosis present

## 2012-12-23 DIAGNOSIS — R3129 Other microscopic hematuria: Secondary | ICD-10-CM | POA: Diagnosis present

## 2012-12-23 DIAGNOSIS — D62 Acute posthemorrhagic anemia: Secondary | ICD-10-CM | POA: Diagnosis present

## 2012-12-23 DIAGNOSIS — K7460 Unspecified cirrhosis of liver: Secondary | ICD-10-CM | POA: Diagnosis present

## 2012-12-23 DIAGNOSIS — F3289 Other specified depressive episodes: Secondary | ICD-10-CM | POA: Diagnosis present

## 2012-12-23 DIAGNOSIS — K7689 Other specified diseases of liver: Secondary | ICD-10-CM | POA: Diagnosis present

## 2012-12-23 DIAGNOSIS — J9692 Respiratory failure, unspecified with hypercapnia: Secondary | ICD-10-CM | POA: Diagnosis present

## 2012-12-23 DIAGNOSIS — E1169 Type 2 diabetes mellitus with other specified complication: Secondary | ICD-10-CM | POA: Diagnosis present

## 2012-12-23 DIAGNOSIS — Z794 Long term (current) use of insulin: Secondary | ICD-10-CM

## 2012-12-23 DIAGNOSIS — R6510 Systemic inflammatory response syndrome (SIRS) of non-infectious origin without acute organ dysfunction: Secondary | ICD-10-CM | POA: Diagnosis present

## 2012-12-23 DIAGNOSIS — E669 Obesity, unspecified: Secondary | ICD-10-CM | POA: Diagnosis present

## 2012-12-23 DIAGNOSIS — I8500 Esophageal varices without bleeding: Secondary | ICD-10-CM

## 2012-12-23 DIAGNOSIS — N390 Urinary tract infection, site not specified: Secondary | ICD-10-CM | POA: Diagnosis present

## 2012-12-23 DIAGNOSIS — G2581 Restless legs syndrome: Secondary | ICD-10-CM | POA: Diagnosis present

## 2012-12-23 DIAGNOSIS — G609 Hereditary and idiopathic neuropathy, unspecified: Secondary | ICD-10-CM | POA: Diagnosis present

## 2012-12-23 DIAGNOSIS — R578 Other shock: Secondary | ICD-10-CM | POA: Diagnosis present

## 2012-12-23 DIAGNOSIS — E785 Hyperlipidemia, unspecified: Secondary | ICD-10-CM | POA: Diagnosis present

## 2012-12-23 HISTORY — DX: Other specified bacterial intestinal infections: A04.8

## 2012-12-23 MED ORDER — ONDANSETRON HCL 4 MG/2ML IJ SOLN
4.0000 mg | Freq: Once | INTRAMUSCULAR | Status: AC
  Administered 2012-12-24: 4 mg via INTRAVENOUS
  Filled 2012-12-23: qty 2

## 2012-12-23 MED ORDER — SODIUM CHLORIDE 0.9 % IV SOLN
Freq: Once | INTRAVENOUS | Status: AC
  Administered 2012-12-23: 10:00:00 via INTRAVENOUS

## 2012-12-23 MED ORDER — SODIUM CHLORIDE 0.9 % IJ SOLN
10.0000 mL | INTRAMUSCULAR | Status: DC | PRN
  Filled 2012-12-23: qty 10

## 2012-12-23 MED ORDER — SODIUM CHLORIDE 0.9 % IV SOLN
1020.0000 mg | Freq: Once | INTRAVENOUS | Status: AC
  Administered 2012-12-23: 1020 mg via INTRAVENOUS
  Filled 2012-12-23: qty 34

## 2012-12-23 MED ORDER — BISMUTH SUBSALICYLATE 262 MG/15ML PO SUSP
30.0000 mL | ORAL | Status: DC | PRN
  Administered 2012-12-23: 30 mL via ORAL
  Filled 2012-12-23: qty 236

## 2012-12-23 MED ORDER — SODIUM CHLORIDE 0.9 % IV BOLUS (SEPSIS)
2000.0000 mL | Freq: Once | INTRAVENOUS | Status: AC
  Administered 2012-12-24: 1000 mL via INTRAVENOUS

## 2012-12-23 MED ORDER — FENTANYL CITRATE 0.05 MG/ML IJ SOLN
50.0000 ug | Freq: Once | INTRAMUSCULAR | Status: AC
  Administered 2012-12-24: 50 ug via INTRAVENOUS
  Filled 2012-12-23: qty 2

## 2012-12-23 NOTE — ED Notes (Signed)
Patient complaining of vomiting blood starting approximately 45 minutes ago. Patient has history of esophageal varices.

## 2012-12-24 ENCOUNTER — Encounter (HOSPITAL_COMMUNITY)
Admission: EM | Disposition: A | Discharge status: Other Acute Inpatient Hospital | Payer: Self-pay | Source: Home / Self Care | Attending: Internal Medicine

## 2012-12-24 ENCOUNTER — Encounter (HOSPITAL_COMMUNITY): Payer: Medicaid Other

## 2012-12-24 ENCOUNTER — Encounter (HOSPITAL_COMMUNITY): Payer: Self-pay | Admitting: Internal Medicine

## 2012-12-24 ENCOUNTER — Emergency Department (HOSPITAL_COMMUNITY): Payer: Medicaid Other

## 2012-12-24 ENCOUNTER — Inpatient Hospital Stay (HOSPITAL_COMMUNITY): Payer: Medicaid Other

## 2012-12-24 DIAGNOSIS — I8500 Esophageal varices without bleeding: Secondary | ICD-10-CM

## 2012-12-24 DIAGNOSIS — A048 Other specified bacterial intestinal infections: Secondary | ICD-10-CM | POA: Diagnosis present

## 2012-12-24 DIAGNOSIS — R3129 Other microscopic hematuria: Secondary | ICD-10-CM | POA: Diagnosis present

## 2012-12-24 DIAGNOSIS — K922 Gastrointestinal hemorrhage, unspecified: Secondary | ICD-10-CM

## 2012-12-24 DIAGNOSIS — J9600 Acute respiratory failure, unspecified whether with hypoxia or hypercapnia: Secondary | ICD-10-CM

## 2012-12-24 DIAGNOSIS — E875 Hyperkalemia: Secondary | ICD-10-CM | POA: Diagnosis present

## 2012-12-24 DIAGNOSIS — I9589 Other hypotension: Secondary | ICD-10-CM | POA: Diagnosis present

## 2012-12-24 DIAGNOSIS — R578 Other shock: Secondary | ICD-10-CM | POA: Diagnosis present

## 2012-12-24 DIAGNOSIS — E118 Type 2 diabetes mellitus with unspecified complications: Secondary | ICD-10-CM

## 2012-12-24 DIAGNOSIS — N390 Urinary tract infection, site not specified: Secondary | ICD-10-CM | POA: Diagnosis present

## 2012-12-24 DIAGNOSIS — D62 Acute posthemorrhagic anemia: Secondary | ICD-10-CM

## 2012-12-24 DIAGNOSIS — K7460 Unspecified cirrhosis of liver: Secondary | ICD-10-CM

## 2012-12-24 DIAGNOSIS — J9692 Respiratory failure, unspecified with hypercapnia: Secondary | ICD-10-CM | POA: Diagnosis present

## 2012-12-24 DIAGNOSIS — D649 Anemia, unspecified: Secondary | ICD-10-CM

## 2012-12-24 DIAGNOSIS — I8501 Esophageal varices with bleeding: Secondary | ICD-10-CM

## 2012-12-24 HISTORY — DX: Other specified bacterial intestinal infections: A04.8

## 2012-12-24 HISTORY — PX: ESOPHAGEAL BANDING: SHX5518

## 2012-12-24 HISTORY — PX: SCHLEROTHERAPY: SHX5440

## 2012-12-24 HISTORY — PX: ESOPHAGOGASTRODUODENOSCOPY: SHX5428

## 2012-12-24 LAB — CBC WITH DIFFERENTIAL/PLATELET
Basophils Absolute: 0 10*3/uL (ref 0.0–0.1)
Basophils Relative: 0 % (ref 0–1)
Eosinophils Absolute: 0.2 10*3/uL (ref 0.0–0.7)
Hemoglobin: 11.1 g/dL — ABNORMAL LOW (ref 13.0–17.0)
MCH: 30.5 pg (ref 26.0–34.0)
MCHC: 32.9 g/dL (ref 30.0–36.0)
Monocytes Absolute: 0.6 10*3/uL (ref 0.1–1.0)
Monocytes Relative: 9 % (ref 3–12)
Neutrophils Relative %: 58 % (ref 43–77)
RDW: 14.4 % (ref 11.5–15.5)

## 2012-12-24 LAB — COMPREHENSIVE METABOLIC PANEL
ALT: 27 U/L (ref 0–53)
ALT: 34 U/L (ref 0–53)
AST: 37 U/L (ref 0–37)
Albumin: 2.2 g/dL — ABNORMAL LOW (ref 3.5–5.2)
Albumin: 3 g/dL — ABNORMAL LOW (ref 3.5–5.2)
Alkaline Phosphatase: 45 U/L (ref 39–117)
Alkaline Phosphatase: 69 U/L (ref 39–117)
BUN: 20 mg/dL (ref 6–23)
CO2: 26 mEq/L (ref 19–32)
Calcium: 9.3 mg/dL (ref 8.4–10.5)
Chloride: 100 mEq/L (ref 96–112)
Chloride: 113 mEq/L — ABNORMAL HIGH (ref 96–112)
Creatinine, Ser: 1.05 mg/dL (ref 0.50–1.35)
GFR calc Af Amer: >90 mL/min (ref >90)
GFR calc non Af Amer: 78 mL/min — ABNORMAL LOW (ref >90)
Glucose, Bld: 158 mg/dL — ABNORMAL HIGH (ref 70–99)
Glucose, Bld: 256 mg/dL — ABNORMAL HIGH (ref 70–99)
Potassium: 4.4 mEq/L (ref 3.5–5.1)
Potassium: 6.4 mEq/L (ref 3.5–5.1)
Sodium: 135 mEq/L (ref 135–145)
Sodium: 139 mEq/L (ref 135–145)
Total Bilirubin: 0.6 mg/dL (ref 0.3–1.2)
Total Bilirubin: 0.7 mg/dL (ref 0.3–1.2)
Total Protein: 5 g/dL — ABNORMAL LOW (ref 6.0–8.3)
Total Protein: 7.1 g/dL (ref 6.0–8.3)

## 2012-12-24 LAB — CBC
HCT: 28 % — ABNORMAL LOW (ref 39.0–52.0)
Hemoglobin: 9.2 g/dL — ABNORMAL LOW (ref 13.0–17.0)
MCH: 31.2 pg (ref 26.0–34.0)
MCHC: 33.2 g/dL (ref 30.0–36.0)
MCHC: 33.5 g/dL (ref 30.0–36.0)
MCHC: 34.2 g/dL (ref 30.0–36.0)
MCV: 93.3 fL (ref 78.0–100.0)
Platelets: 94 10*3/uL — ABNORMAL LOW (ref 150–400)
Platelets: 91 10*3/uL — ABNORMAL LOW (ref 150–400)
Platelets: 143 10*3/uL — ABNORMAL LOW (ref 150–400)
RBC: 2.93 MIL/uL — ABNORMAL LOW (ref 4.22–5.81)
RDW: 14.4 % (ref 11.5–15.5)
RDW: 14.6 % (ref 11.5–15.5)
RDW: 14.6 % (ref 11.5–15.5)
WBC: 16.2 10*3/uL — ABNORMAL HIGH (ref 4.0–10.5)
WBC: 15.4 10*3/uL — ABNORMAL HIGH (ref 4.0–10.5)

## 2012-12-24 LAB — GLUCOSE, CAPILLARY
Glucose-Capillary: 126 mg/dL — ABNORMAL HIGH (ref 70–99)
Glucose-Capillary: 165 mg/dL — ABNORMAL HIGH (ref 70–99)
Glucose-Capillary: 239 mg/dL — ABNORMAL HIGH (ref 70–99)
Glucose-Capillary: 255 mg/dL — ABNORMAL HIGH (ref 70–99)
Glucose-Capillary: 257 mg/dL — ABNORMAL HIGH (ref 70–99)
Glucose-Capillary: 259 mg/dL — ABNORMAL HIGH (ref 70–99)

## 2012-12-24 LAB — PROTIME-INR
INR: 1.37 (ref 0.00–1.49)
Prothrombin Time: 16.5 seconds — ABNORMAL HIGH (ref 11.6–15.2)

## 2012-12-24 LAB — BLOOD GAS, ARTERIAL
Bicarbonate: 21 mEq/L (ref 20.0–24.0)
PEEP: 5 cmH2O
pH, Arterial: 7.286 — ABNORMAL LOW (ref 7.350–7.450)
pO2, Arterial: 118 mmHg — ABNORMAL HIGH (ref 80.0–100.0)

## 2012-12-24 LAB — BASIC METABOLIC PANEL
Chloride: 107 mEq/L (ref 96–112)
Creatinine, Ser: 1.05 mg/dL (ref 0.50–1.35)
GFR calc Af Amer: >90 mL/min (ref >90)
GFR calc Af Amer: >90 mL/min (ref >90)
GFR calc non Af Amer: >90 mL/min (ref >90)
GFR calc non Af Amer: 78 mL/min — ABNORMAL LOW (ref >90)
Glucose, Bld: 179 mg/dL — ABNORMAL HIGH (ref 70–99)
Potassium: 7 mEq/L (ref 3.5–5.1)
Potassium: 6.1 mEq/L — ABNORMAL HIGH (ref 3.5–5.1)
Sodium: 137 mEq/L (ref 135–145)

## 2012-12-24 LAB — URINALYSIS, ROUTINE W REFLEX MICROSCOPIC
Glucose, UA: NEGATIVE mg/dL
Ketones, ur: NEGATIVE mg/dL
Protein, ur: 30 mg/dL — AB
Urobilinogen, UA: 0.2 mg/dL (ref 0.0–1.0)

## 2012-12-24 LAB — URINE MICROSCOPIC-ADD ON

## 2012-12-24 LAB — PREPARE RBC (CROSSMATCH)

## 2012-12-24 LAB — APTT: aPTT: 29 seconds (ref 24–37)

## 2012-12-24 SURGERY — EGD (ESOPHAGOGASTRODUODENOSCOPY)
Anesthesia: Moderate Sedation

## 2012-12-24 MED ORDER — SODIUM CHLORIDE 0.9 % IV SOLN
2.0000 mg/h | INTRAVENOUS | Status: DC
  Administered 2012-12-24 (×2): 2 mg/h via INTRAVENOUS
  Administered 2012-12-24: 5 mg/h via INTRAVENOUS
  Administered 2012-12-25: 2 mg/h via INTRAVENOUS
  Filled 2012-12-24 (×3): qty 10

## 2012-12-24 MED ORDER — FUROSEMIDE 10 MG/ML IJ SOLN
INTRAMUSCULAR | Status: AC
  Administered 2012-12-24: 40 mg via INTRAVENOUS
  Filled 2012-12-24: qty 4

## 2012-12-24 MED ORDER — FUROSEMIDE 10 MG/ML IJ SOLN
10.0000 mg | Freq: Once | INTRAMUSCULAR | Status: AC
  Administered 2012-12-24: 40 mg via INTRAVENOUS

## 2012-12-24 MED ORDER — PANTOPRAZOLE SODIUM 40 MG IV SOLR
8.0000 mg/h | INTRAVENOUS | Status: DC
  Administered 2012-12-24 – 2012-12-25 (×4): 8 mg/h via INTRAVENOUS
  Filled 2012-12-24 (×14): qty 80

## 2012-12-24 MED ORDER — DEXTROSE 50 % IV SOLN
1.0000 | Freq: Once | INTRAVENOUS | Status: AC
  Administered 2012-12-24: 50 mL via INTRAVENOUS

## 2012-12-24 MED ORDER — MIDAZOLAM HCL 50 MG/10ML IJ SOLN
INTRAMUSCULAR | Status: AC
  Filled 2012-12-24: qty 1

## 2012-12-24 MED ORDER — ALBUTEROL SULFATE (5 MG/ML) 0.5% IN NEBU
2.5000 mg | INHALATION_SOLUTION | RESPIRATORY_TRACT | Status: DC
  Administered 2012-12-24 – 2012-12-25 (×10): 2.5 mg via RESPIRATORY_TRACT
  Filled 2012-12-24 (×10): qty 0.5

## 2012-12-24 MED ORDER — DEXTROSE 5 % IV SOLN
1.0000 g | Freq: Two times a day (BID) | INTRAVENOUS | Status: DC
  Administered 2012-12-24 – 2012-12-25 (×4): 1 g via INTRAVENOUS
  Filled 2012-12-24 (×7): qty 1

## 2012-12-24 MED ORDER — DEXTROSE 50 % IV SOLN
INTRAVENOUS | Status: AC
  Administered 2012-12-24: 50 mL via INTRAVENOUS
  Filled 2012-12-24: qty 50

## 2012-12-24 MED ORDER — MIDAZOLAM HCL 2 MG/2ML IJ SOLN: 2.0000 mg | INTRAMUSCULAR | Status: DC | PRN

## 2012-12-24 MED ORDER — ALBUTEROL SULFATE (5 MG/ML) 0.5% IN NEBU: 2.5000 mg | INHALATION_SOLUTION | RESPIRATORY_TRACT | Status: DC | PRN

## 2012-12-24 MED ORDER — SODIUM BICARBONATE 8.4 % IV SOLN
INTRAVENOUS | Status: DC
  Administered 2012-12-24 (×3): via INTRAVENOUS
  Filled 2012-12-24 (×18): qty 1000

## 2012-12-24 MED ORDER — DOPAMINE-DEXTROSE 3.2-5 MG/ML-% IV SOLN
2.0000 ug/kg/min | INTRAVENOUS | Status: DC
  Administered 2012-12-24: 10 ug/kg/min via INTRAVENOUS
  Administered 2012-12-25: 12 ug/kg/min via INTRAVENOUS
  Filled 2012-12-24 (×2): qty 250

## 2012-12-24 MED ORDER — SODIUM CHLORIDE 0.9 % IV SOLN
1.0000 mg/h | INTRAVENOUS | Status: DC
  Administered 2012-12-24: 4 mg/h via INTRAVENOUS
  Administered 2012-12-24: 2 mg/h via INTRAVENOUS
  Administered 2012-12-24 – 2012-12-25 (×4): 10 mg/h via INTRAVENOUS
  Filled 2012-12-24 (×3): qty 10

## 2012-12-24 MED ORDER — FENTANYL CITRATE 0.05 MG/ML IJ SOLN
100.0000 ug | Freq: Once | INTRAMUSCULAR | Status: AC
  Administered 2012-12-24: 100 ug via INTRAVENOUS
  Filled 2012-12-24: qty 2

## 2012-12-24 MED ORDER — SODIUM CHLORIDE 0.9 % IV SOLN
INTRAVENOUS | Status: AC
  Administered 2012-12-24: 1 g via INTRAVENOUS
  Filled 2012-12-24: qty 10

## 2012-12-24 MED ORDER — SODIUM CHLORIDE 0.9 % IV SOLN
INTRAVENOUS | Status: DC
  Administered 2012-12-24 (×2): via INTRAVENOUS

## 2012-12-24 MED ORDER — INSULIN ASPART 100 UNIT/ML ~~LOC~~ SOLN
2.0000 [IU] | SUBCUTANEOUS | Status: DC
  Administered 2012-12-24: 6 [IU] via SUBCUTANEOUS
  Administered 2012-12-24: 2 [IU] via SUBCUTANEOUS
  Administered 2012-12-24: 6 [IU] via SUBCUTANEOUS
  Administered 2012-12-24: 4 [IU] via SUBCUTANEOUS
  Administered 2012-12-24: 8 [IU] via SUBCUTANEOUS
  Administered 2012-12-25 (×2): 6 [IU] via SUBCUTANEOUS
  Administered 2012-12-25: 4 [IU] via SUBCUTANEOUS
  Administered 2012-12-25: 6 [IU] via SUBCUTANEOUS
  Administered 2012-12-25: 8 [IU] via SUBCUTANEOUS
  Administered 2012-12-25: 2 [IU] via SUBCUTANEOUS
  Administered 2012-12-25: 6 [IU] via SUBCUTANEOUS

## 2012-12-24 MED ORDER — SUCCINYLCHOLINE CHLORIDE 20 MG/ML IJ SOLN
INTRAMUSCULAR | Status: AC | PRN
  Administered 2012-12-24: 100 mg via INTRAVENOUS

## 2012-12-24 MED ORDER — STERILE WATER FOR IRRIGATION IR SOLN
Status: DC | PRN
  Administered 2012-12-24: 06:00:00

## 2012-12-24 MED ORDER — MIDAZOLAM BOLUS VIA INFUSION
1.0000 mg | INTRAVENOUS | Status: DC | PRN
  Filled 2012-12-24: qty 2

## 2012-12-24 MED ORDER — INSULIN ASPART 100 UNIT/ML ~~LOC~~ SOLN
10.0000 [IU] | Freq: Once | SUBCUTANEOUS | Status: AC
  Administered 2012-12-24: 10 [IU] via SUBCUTANEOUS

## 2012-12-24 MED ORDER — CEFTRIAXONE SODIUM 1 G IJ SOLR
INTRAMUSCULAR | Status: AC
  Filled 2012-12-24: qty 10

## 2012-12-24 MED ORDER — MIDAZOLAM HCL 5 MG/5ML IJ SOLN
4.0000 mg | Freq: Once | INTRAMUSCULAR | Status: AC
  Administered 2012-12-24: 4 mg via INTRAVENOUS
  Filled 2012-12-24: qty 5

## 2012-12-24 MED ORDER — OCTREOTIDE LOAD VIA INFUSION
50.0000 ug | Freq: Once | INTRAVENOUS | Status: AC
  Administered 2012-12-24 (×2): 50 ug via INTRAVENOUS
  Filled 2012-12-24: qty 25

## 2012-12-24 MED ORDER — OCTREOTIDE ACETATE 500 MCG/ML IJ SOLN
INTRAMUSCULAR | Status: AC
  Filled 2012-12-24: qty 1

## 2012-12-24 MED ORDER — SODIUM CHLORIDE 0.9 % IJ SOLN
10.0000 mL | Freq: Two times a day (BID) | INTRAMUSCULAR | Status: DC
  Administered 2012-12-24: 20 mL
  Administered 2012-12-24 – 2012-12-25 (×2): 10 mL

## 2012-12-24 MED ORDER — SODIUM POLYSTYRENE SULFONATE 15 GM/60ML PO SUSP: 30.0000 g | Freq: Once | ORAL | Status: DC

## 2012-12-24 MED ORDER — ETHANOLAMINE OLEATE 5 % IV SOLN
INTRAVENOUS | Status: DC | PRN
  Administered 2012-12-24: 9 mL via SUBMUCOSAL

## 2012-12-24 MED ORDER — ONDANSETRON HCL 4 MG/2ML IJ SOLN
4.0000 mg | Freq: Four times a day (QID) | INTRAMUSCULAR | Status: DC | PRN
  Filled 2012-12-24: qty 2

## 2012-12-24 MED ORDER — SODIUM BICARBONATE 8.4 % IV SOLN
50.0000 meq | Freq: Once | INTRAVENOUS | Status: AC
  Administered 2012-12-24: 50 meq via INTRAVENOUS
  Filled 2012-12-24: qty 50

## 2012-12-24 MED ORDER — SODIUM CHLORIDE 0.9 % IV SOLN
25.0000 ug/h | INTRAVENOUS | Status: DC
  Administered 2012-12-24 (×3): 50 ug/h via INTRAVENOUS
  Administered 2012-12-24: 25 ug/h via INTRAVENOUS
  Administered 2012-12-24 – 2012-12-25 (×2): 50 ug/h via INTRAVENOUS
  Filled 2012-12-24 (×13): qty 1

## 2012-12-24 MED ORDER — ETHANOLAMINE OLEATE 5 % IV SOLN
INTRAVENOUS | Status: AC
  Filled 2012-12-24: qty 20

## 2012-12-24 MED ORDER — SODIUM CHLORIDE 0.9 % IJ SOLN: 10.0000 mL | INTRAMUSCULAR | Status: DC | PRN

## 2012-12-24 MED ORDER — ONDANSETRON HCL 4 MG PO TABS: 4.0000 mg | ORAL_TABLET | Freq: Four times a day (QID) | ORAL | Status: DC | PRN

## 2012-12-24 MED ORDER — ETOMIDATE 2 MG/ML IV SOLN
INTRAVENOUS | Status: AC | PRN
  Administered 2012-12-24: 20 mg via INTRAVENOUS

## 2012-12-24 MED ORDER — CHLORHEXIDINE GLUCONATE 0.12 % MT SOLN
15.0000 mL | Freq: Two times a day (BID) | OROMUCOSAL | Status: DC
  Administered 2012-12-24 – 2012-12-25 (×4): 15 mL via OROMUCOSAL
  Filled 2012-12-24 (×4): qty 15

## 2012-12-24 MED ORDER — INSULIN GLARGINE 100 UNIT/ML ~~LOC~~ SOLN
40.0000 [IU] | Freq: Every day | SUBCUTANEOUS | Status: DC
  Administered 2012-12-24 – 2012-12-25 (×2): 40 [IU] via SUBCUTANEOUS

## 2012-12-24 MED ORDER — SODIUM CHLORIDE 0.9 % IV SOLN
1.0000 g | Freq: Once | INTRAVENOUS | Status: AC
  Administered 2012-12-24: 1 g via INTRAVENOUS
  Filled 2012-12-24: qty 10

## 2012-12-24 MED ORDER — SODIUM CHLORIDE 0.9 % IV BOLUS (SEPSIS)
500.0000 mL | Freq: Once | INTRAVENOUS | Status: AC
  Administered 2012-12-24: 500 mL via INTRAVENOUS

## 2012-12-24 MED ORDER — PANTOPRAZOLE SODIUM 40 MG IV SOLR
40.0000 mg | Freq: Once | INTRAVENOUS | Status: AC
  Administered 2012-12-24: 40 mg via INTRAVENOUS
  Filled 2012-12-24: qty 40

## 2012-12-24 MED ORDER — BIOTENE DRY MOUTH MT LIQD
15.0000 mL | Freq: Four times a day (QID) | OROMUCOSAL | Status: DC
  Administered 2012-12-24 – 2012-12-26 (×8): 15 mL via OROMUCOSAL

## 2012-12-24 MED ORDER — CEFTRIAXONE SODIUM 1 G IJ SOLR
1.0000 g | INTRAMUSCULAR | Status: DC
  Administered 2012-12-24: 1 g via INTRAVENOUS
  Filled 2012-12-24: qty 10

## 2012-12-24 MED ORDER — FENTANYL CITRATE 0.05 MG/ML IJ SOLN
50.0000 ug | INTRAMUSCULAR | Status: DC | PRN
  Administered 2012-12-24 – 2012-12-25 (×2): 100 ug via INTRAVENOUS
  Filled 2012-12-24 (×2): qty 2

## 2012-12-24 MED ORDER — ALBUTEROL SULFATE (5 MG/ML) 0.5% IN NEBU: 2.5000 mg | INHALATION_SOLUTION | RESPIRATORY_TRACT | Status: DC

## 2012-12-24 MED ORDER — VANCOMYCIN HCL IN DEXTROSE 1-5 GM/200ML-% IV SOLN
1000.0000 mg | Freq: Three times a day (TID) | INTRAVENOUS | Status: DC
  Administered 2012-12-24 – 2012-12-25 (×4): 1000 mg via INTRAVENOUS
  Filled 2012-12-24 (×10): qty 200

## 2012-12-24 NOTE — Discharge Summary (Addendum)
Physician Discharge Summary  MADYX DELFIN MRN: [id number] DOB/AGE: 56-Jul-1957 56 y.o.  PCP: Redmond Baseman, MD   Admit date: 12/23/2012 Discharge date: 12/24/2012  Discharge Diagnoses:   1. Severe upper GI bleed secondary to bleeding esophageal varices. Status post hemostasis therapy with injection of a total of 9 mL of 5% ethanolamine and application of 9 bands, per EGD by gastroenterologist, Dr. Jena Gauss. 2. Acute post hemorrhagic anemia, status post 3 units of packed red blood cell transfusions. 3. Acute ventilator dependent hypercapnic respiratory failure. 4. Hypovolemia secondary to hemorrhagic shock. 5. Microcytic hematuria. Query urinary tract infection. 6. Hyperkalemia. 7. Type 2 diabetes mellitus. 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 9. H. pylori infection, diagnosed prior to this hospitalization. 10. 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 11. Pneumonia, query aspiration. 12. Systemic inflammatory response syndrome.      Medication List   1. D5 normal saline at 150 cc per minute. 2. Neo-Synephrine drip titrated to keep his systolic blood pressure at greater than 90. 3. Versed drip, 2 mg per hour. 4. Protonix drip 80 mg; 8 mg per hour equals 25 mL per hour intravenously. 5. Albuterol 2.5 nebulization every 4 hours. 6. Cefepime 1 g every 12 hours. 7. Sliding scale NovoLog every 4 hours. 8. Lantus, 40 units subcutaneous daily. 9. Octreotide drip 25-50 mcg/hour equals 12.5-25 mL per hour intravenously, continuously. 10. Vancomycin 1000 mg intravenously every 8 hours. 11. Fentanyl 50-100 mcg intravenously every 2 hours when necessary severe pain. 12. Zofran 4 mg intravenously every 6 hours as needed for nausea.       Discharge Condition: Critical.  Disposition: The patient will be transferred to [facility] ASAP via air transport to the attending Dr. Donnie Aho. Discussed with  hepatologist, Dr. Marcheta Grammes.   Consults: Gastroenterologist, Gennette Pac, M.D.   Significant Diagnostic Studies: Dg Chest Port 1 View  12/24/2012  *RADIOLOGY REPORT*  Clinical Data: PICC placement.  GI bleed.  No esophageal varices. Pulmonary infiltrates.  PORTABLE CHEST - 1 VIEW  Comparison: 12/24/2012  Findings: PICC tip appears to be 7.6 cm below the carina in the right atrium.  I recommend it be retracted approximately 3 cm.  Endotracheal tube is in good position.  New atelectasis at the right lung base.  Partial clearing of the patchy infiltrate in the left upper lung zone.  New slight atelectasis in the left mid and upper lung zone.  No effusions. Heart size and vascularity are normal.  IMPRESSION:  1.  PICC tip is in the right atrium and could be retracted approximately 3 cm. 2.  New atelectasis at the right base and left midzone. 3.  Improving faint infiltrate in the left upper lobe.   Original Report Authenticated By: Francene Boyers, M.D.    Dg Chest Portable 1 View  12/24/2012  *RADIOLOGY REPORT*  Clinical Data: Intubated  PORTABLE CHEST - 1 VIEW  Comparison: 12/24/2012  Findings: Endotracheal tube tip 3.3 cm proximal to the carina. Prominent cardiomediastinal contours.  Bilateral interstitial and airspace opacities, most pronounced in the left upper lung. Hypoaeration.  No pneumothorax.  No acute osseous finding.  IMPRESSION: Endotracheal tube tip 3.2 cm proximal to the carina.  Bilateral interstitial and airspace opacities; edema or pneumonia.   Original Report Authenticated By: Jearld Lesch, M.D.    Dg Abd Acute W/chest  12/24/2012  *RADIOLOGY REPORT*  Clinical Data: Abdominal pain and vomiting.  Hematemesis.  ACUTE ABDOMEN SERIES (ABDOMEN 2 VIEW &  CHEST 1 VIEW)  Comparison: 04/13/2012 and prior radiographs.  06/25/2012 CT  Findings: Upper limits normal heart size identified. There is no evidence of airspace disease, pleural effusion or pneumothorax.  A paucity of bowel gas is  noted with only a small amount of gas noted in the colon. Fluid and gas-filled stomach is noted. There is no evidence of pneumoperitoneum. No acute bony abnormalities are identified.,  IMPRESSION: Nonspecific bowel gas pattern with paucity of bowel gas and fluid and gas filled stomach.  CT may be helpful for further evaluation as clinically indicated.  No evidence of acute cardiopulmonary disease.   Original Report Authenticated By: Harmon Pier, M.D.     OTHER PROCEDURES: 1. Intubation and mechanical ventilation, in the ED. 2. EGD with hemostasis therapy. 3. Right upper extremity PICC. 4. Right upper extremity arterial line.   Microbiology: Recent Results (from the past 240 hour(s))  MRSA PCR SCREENING     Status: Normal   Collection Time   12/24/12  2:58 AM      Component Value Range Status Comment   MRSA by PCR NEGATIVE  NEGATIVE Final      Labs: Results for orders placed during the hospital encounter of 12/23/12 (from the past 48 hour(s))  CBC WITH DIFFERENTIAL     Status: Abnormal   Collection Time   12/23/12 11:50 PM      Component Value Range Comment   WBC 6.8  4.0 - 10.5 K/uL    RBC 3.64 (*) 4.22 - 5.81 MIL/uL    Hemoglobin 11.1 (*) 13.0 - 17.0 g/dL    HCT 16.1 (*) 09.6 - 52.0 %    MCV 92.6  78.0 - 100.0 fL    MCH 30.5  26.0 - 34.0 pg    MCHC 32.9  30.0 - 36.0 g/dL    RDW 04.5  40.9 - 81.1 %    Platelets 89 (*) 150 - 400 K/uL    Neutrophils Relative 58  43 - 77 %    Neutro Abs 3.9  1.7 - 7.7 K/uL    Lymphocytes Relative 31  12 - 46 %    Lymphs Abs 2.1  0.7 - 4.0 K/uL    Monocytes Relative 9  3 - 12 %    Monocytes Absolute 0.6  0.1 - 1.0 K/uL    Eosinophils Relative 2  0 - 5 %    Eosinophils Absolute 0.2  0.0 - 0.7 K/uL    Basophils Relative 0  0 - 1 %    Basophils Absolute 0.0  0.0 - 0.1 K/uL   COMPREHENSIVE METABOLIC PANEL     Status: Abnormal   Collection Time   12/23/12 11:50 PM      Component Value Range Comment   Sodium 135  135 - 145 mEq/L    Potassium 4.4   3.5 - 5.1 mEq/L    Chloride 100  96 - 112 mEq/L    CO2 26  19 - 32 mEq/L    Glucose, Bld 256 (*) 70 - 99 mg/dL    BUN 20  6 - 23 mg/dL    Creatinine, Ser 9.14  0.50 - 1.35 mg/dL    Calcium 9.3  8.4 - 78.2 mg/dL    Total Protein 7.1  6.0 - 8.3 g/dL    Albumin 3.0 (*) 3.5 - 5.2 g/dL    AST 37  0 - 37 U/L    ALT 34  0 - 53 U/L    Alkaline Phosphatase 69  39 -  117 U/L    Total Bilirubin 0.6  0.3 - 1.2 mg/dL    GFR calc non Af Amer 78 (*) >90 mL/min    GFR calc Af Amer >90  >90 mL/min   TYPE AND SCREEN     Status: Normal (Preliminary result)   Collection Time   12/23/12 11:50 PM      Component Value Range Comment   ABO/RH(D) O POS      Antibody Screen NEG      Sample Expiration 12/26/2012      Unit Number [id number]      Blood Component Type RED CELLS,LR      Unit division 00      Status of Unit ISSUED      Transfusion Status OK TO TRANSFUSE      Crossmatch Result Compatible      Unit Number [id number]      Blood Component Type RED CELLS,LR      Unit division 00      Status of Unit ISSUED      Transfusion Status OK TO TRANSFUSE      Crossmatch Result Compatible      Unit Number [id number]      Blood Component Type RED CELLS,LR      Unit division 00      Status of Unit ISSUED      Transfusion Status OK TO TRANSFUSE      Crossmatch Result Compatible      Unit Number [id number]      Blood Component Type RED CELLS,LR      Unit division 00      Status of Unit ALLOCATED      Transfusion Status OK TO TRANSFUSE      Crossmatch Result Compatible      Unit Number [id number]      Blood Component Type RED CELLS,LR      Unit division 00      Status of Unit ALLOCATED      Transfusion Status OK TO TRANSFUSE      Crossmatch Result Compatible      Unit Number [id number]      Blood Component Type RED CELLS,LR      Unit division 00      Status of Unit ALLOCATED      Transfusion Status OK TO TRANSFUSE      Crossmatch Result Compatible     PROTIME-INR     Status:  Abnormal   Collection Time   12/23/12 11:50 PM      Component Value Range Comment   Prothrombin Time 16.5 (*) 11.6 - 15.2 seconds    INR 1.37  0.00 - 1.49   APTT     Status: Normal   Collection Time   12/23/12 11:50 PM      Component Value Range Comment   aPTT 29  24 - 37 seconds   PREPARE RBC (CROSSMATCH)     Status: Normal   Collection Time   12/24/12 12:05 AM      Component Value Range Comment   Order Confirmation ORDER PROCESSED BY BLOOD BANK     PREPARE RBC (CROSSMATCH)     Status: Normal   Collection Time   12/24/12 12:41 AM      Component Value Range Comment   Order Confirmation ORDER PROCESSED BY BLOOD BANK     MRSA PCR SCREENING     Status: Normal   Collection Time   12/24/12  2:58 AM      Component Value Range  Comment   MRSA by PCR NEGATIVE  NEGATIVE   BLOOD GAS, ARTERIAL     Status: Abnormal (Preliminary result)   Collection Time   12/24/12  3:15 AM      Component Value Range Comment   FIO2 100.00      Delivery systems VENTILATOR      Mode PRESSURE REGULATED VOLUME CONTROL      VT 550      Rate 16      Peep/cpap 5.0      pH, Arterial 7.209 (*) 7.350 - 7.450    pCO2 arterial 52.6 (*) 35.0 - 45.0 mmHg    pO2, Arterial 135.0 (*) 80.0 - 100.0 mmHg    Bicarbonate 20.2  20.0 - 24.0 mEq/L    TCO2 20.1  0 - 100 mmol/L    Acid-base deficit 6.4 (*) 0.0 - 2.0 mmol/L    O2 Saturation 99.5      Patient temperature 37.0      Collection site RIGHT RADIAL      Drawn by [id number]      Sample type ARTERIAL      Allens test (pass/fail) PENDING  PASS   URINALYSIS, ROUTINE W REFLEX MICROSCOPIC     Status: Abnormal   Collection Time   12/24/12  5:32 AM      Component Value Range Comment   Color, Urine YELLOW  YELLOW    APPearance CLEAR  CLEAR    Specific Gravity, Urine 1.025  1.005 - 1.030    pH 6.0  5.0 - 8.0    Glucose, UA NEGATIVE  NEGATIVE mg/dL    Hgb urine dipstick MODERATE (*) NEGATIVE    Bilirubin Urine SMALL (*) NEGATIVE    Ketones, ur NEGATIVE  NEGATIVE mg/dL     Protein, ur 30 (*) NEGATIVE mg/dL    Urobilinogen, UA 0.2  0.0 - 1.0 mg/dL    Nitrite NEGATIVE  NEGATIVE    Leukocytes, UA NEGATIVE  NEGATIVE   URINE MICROSCOPIC-ADD ON     Status: Abnormal   Collection Time   12/24/12  5:32 AM      Component Value Range Comment   RBC / HPF TOO NUMEROUS TO COUNT  <3 RBC/hpf    Bacteria, UA MANY (*) RARE   GLUCOSE, CAPILLARY     Status: Abnormal   Collection Time   12/24/12  5:35 AM      Component Value Range Comment   Glucose-Capillary 126 (*) 70 - 99 mg/dL    Comment 1 Notify RN      Comment 2 Documented in Chart     COMPREHENSIVE METABOLIC PANEL     Status: Abnormal   Collection Time   12/24/12  5:47 AM      Component Value Range Comment   Sodium 139  135 - 145 mEq/L    Potassium 6.4 (*) 3.5 - 5.1 mEq/L    Chloride 113 (*) 96 - 112 mEq/L DELTA CHECK NOTED   CO2 22  19 - 32 mEq/L    Glucose, Bld 158 (*) 70 - 99 mg/dL    BUN 18  6 - 23 mg/dL    Creatinine, Ser 1.61  0.50 - 1.35 mg/dL    Calcium 7.0 (*) 8.4 - 10.5 mg/dL    Total Protein 5.0 (*) 6.0 - 8.3 g/dL    Albumin 2.2 (*) 3.5 - 5.2 g/dL    AST 37  0 - 37 U/L    ALT 27  0 - 53 U/L  Alkaline Phosphatase 45  39 - 117 U/L    Total Bilirubin 0.7  0.3 - 1.2 mg/dL    GFR calc non Af Amer >90  >90 mL/min    GFR calc Af Amer >90  >90 mL/min   CBC     Status: Abnormal   Collection Time   12/24/12  5:47 AM      Component Value Range Comment   WBC 11.2 (*) 4.0 - 10.5 K/uL    RBC 3.00 (*) 4.22 - 5.81 MIL/uL    Hemoglobin 9.3 (*) 13.0 - 17.0 g/dL    HCT 45.4 (*) 09.8 - 52.0 %    MCV 93.3  78.0 - 100.0 fL    MCH 31.0  26.0 - 34.0 pg    MCHC 33.2  30.0 - 36.0 g/dL    RDW 11.9  14.7 - 82.9 %    Platelets 94 (*) 150 - 400 K/uL   BASIC METABOLIC PANEL     Status: Abnormal   Collection Time   12/24/12  7:01 AM      Component Value Range Comment   Sodium 137  135 - 145 mEq/L    Potassium 7.0 (*) 3.5 - 5.1 mEq/L    Chloride 111  96 - 112 mEq/L    CO2 22  19 - 32 mEq/L    Glucose, Bld 179 (*) 70  - 99 mg/dL    BUN 19  6 - 23 mg/dL    Creatinine, Ser 5.62  0.50 - 1.35 mg/dL    Calcium 6.7 (*) 8.4 - 10.5 mg/dL    GFR calc non Af Amer >90  >90 mL/min    GFR calc Af Amer >90  >90 mL/min   GLUCOSE, CAPILLARY     Status: Abnormal   Collection Time   12/24/12  7:44 AM      Component Value Range Comment   Glucose-Capillary 165 (*) 70 - 99 mg/dL    Comment 1 Documented in Chart      Comment 2 Notify RN     CBC     Status: Abnormal   Collection Time   12/24/12  9:49 AM      Component Value Range Comment   WBC 10.8 (*) 4.0 - 10.5 K/uL    RBC 2.53 (*) 4.22 - 5.81 MIL/uL    Hemoglobin 7.9 (*) 13.0 - 17.0 g/dL    HCT 13.0 (*) 86.5 - 52.0 %    MCV 93.3  78.0 - 100.0 fL    MCH 31.2  26.0 - 34.0 pg    MCHC 33.5  30.0 - 36.0 g/dL    RDW 78.4  69.6 - 29.5 %    Platelets 91 (*) 150 - 400 K/uL   BLOOD GAS, ARTERIAL     Status: Abnormal   Collection Time   12/24/12 10:25 AM      Component Value Range Comment   FIO2 60.00      Delivery systems VENTILATOR      Mode PRESSURE REGULATED VOLUME CONTROL      VT 550      Rate 16      Peep/cpap 5.0      pH, Arterial 7.286 (*) 7.350 - 7.450    pCO2 arterial 45.6 (*) 35.0 - 45.0 mmHg    pO2, Arterial 118.0 (*) 80.0 - 100.0 mmHg    Bicarbonate 21.0  20.0 - 24.0 mEq/L    TCO2 20.5  0 - 100 mmol/L    Acid-base deficit 4.6 (*)  0.0 - 2.0 mmol/L    O2 Saturation 99.7      Patient temperature 37.0      Collection site LEFT RADIAL      Drawn by COLLECTED BY RT      Sample type ARTERIAL      Allens test (pass/fail) PASS  PASS   GLUCOSE, CAPILLARY     Status: Abnormal   Collection Time   12/24/12 11:20 AM      Component Value Range Comment   Glucose-Capillary 259 (*) 70 - 99 mg/dL    Comment 1 Documented in Chart      Comment 2 Notify RN     PREPARE RBC (CROSSMATCH)     Status: Normal   Collection Time   12/24/12 11:30 AM      Component Value Range Comment   Order Confirmation ORDER PROCESSED BY BLOOD BANK     GLUCOSE, CAPILLARY     Status:  Abnormal   Collection Time   12/24/12  3:55 PM      Component Value Range Comment   Glucose-Capillary 255 (*) 70 - 99 mg/dL    Comment 1 Documented in Chart      Comment 2 Notify RN     CBC     Status: Abnormal   Collection Time   12/24/12  4:07 PM      Component Value Range Comment   WBC 16.2 (*) 4.0 - 10.5 K/uL    RBC 3.23 (*) 4.22 - 5.81 MIL/uL    Hemoglobin 10.0 (*) 13.0 - 17.0 g/dL DELTA CHECK NOTED   HCT 29.7 (*) 39.0 - 52.0 %    MCV 92.0  78.0 - 100.0 fL    MCH 31.0  26.0 - 34.0 pg    MCHC 33.7  30.0 - 36.0 g/dL    RDW 14.7  82.9 - 56.2 %    Platelets 143 (*) 150 - 400 K/uL DELTA CHECK NOTED  BASIC METABOLIC PANEL     Status: Abnormal   Collection Time   12/24/12  4:07 PM      Component Value Range Comment   Sodium 134 (*) 135 - 145 mEq/L    Potassium 6.1 (*) 3.5 - 5.1 mEq/L    Chloride 107  96 - 112 mEq/L    CO2 24  19 - 32 mEq/L    Glucose, Bld 282 (*) 70 - 99 mg/dL    BUN 23  6 - 23 mg/dL    Creatinine, Ser 1.30  0.50 - 1.35 mg/dL    Calcium 6.7 (*) 8.4 - 10.5 mg/dL    GFR calc non Af Amer 78 (*) >90 mL/min    GFR calc Af Amer >90  >90 mL/min      HPI : The patient is a 57 year old man with a history significant for [name] cirrhosis, esophageal varices, and diabetes mellitus, who presented to the emergency department on 12/23/2012 with a chief complaint of vomiting blood and passing maroon-colored stools. He also had mild generalized, lightheadedness, and dizziness. abdominal pain during the evaluation in the emergency department, he was hypotensive with a blood pressure systolically ranging from the 70s to the 130s. His lab data were significant for a glucose of 256, hemoglobin of 11.1, platelet count of 89, PT of 16.5, INR 1.37, and PTT of 29. Acute abdominal series revealed fluid and gas-filled stomach and no acute cardiopulmonary disease. Gastroenterologist, Dr. Jena Gauss was consulted immediately. Because of the patient's hemodynamic instability, he was intubated primarily  for airway protection.  Sandostatin and protonix infusions were started. He was subsequently admitted to the ICU.  HOSPITAL COURSE:   The patient was continued on the protonix and Sandostatin drips. He was transfused 2 units of packed red blood cells with 2 units kept ahead at all times. IV fluids were started and continued for hydration and volume resuscitation. Rocephin was started empirically. His CBC was ordered every 6 hours. Following Dr. Luvenia Starch initial evaluation, he immediately performed an EGD. Although a complete EGD was not carried out, Dr. Jena Gauss provided hemostasis therapy to multiple esophageal varices, one of which was bleeding significantly. (See his operative note). Following the hemostasis therapy, he recommended a followup EGD and TIPS in the future.  The patient's hemoglobin fell from 11.1, to 9.3, to 7.9. He was transfused an additional unit of packed red blood cells for a total of 3 units. His hemoglobin has been stable at 9.0 for the past 3 blood draws. This is indicative of no further active bleeding.  Pulmonologist, Dr. Juanetta Gosling was consulted for assistance with ventilator management. He made appropriate changes in the ventilator settings. His followup ABG on the morning of 12/25/2012 revealed a pH of 7.32, PCO2 of 43.5, and a PO2 of 67.1. Adjustments in the ventilator settings are being made to accommodate the patient's relative hypoxia. His followup chest x-ray revealed patchy perihilar and bibasilar interstitial airspace opacities, possibly secondary to pneumonia. His pro BNP was less than 5 although the patient has received multiple liters of IV fluids for volume resuscitation. Therefore, antibiotic therapy was broadened to cefepime, vancomycin, and Levaquin. Rocephin was discontinued.  IV fluids were titrated up to 200 cc an hour due to his relative hypotension. In addition, dopamine was started and titrated up to 10 mcg. His blood pressure has improved, but he became tachycardic  on dopamine. Therefore, dopamine was discontinued in favor of Neo-Synephrine drip. His heart rate is improving. His blood pressure is being maintained in the mid to upper 90s systolically.   The patient's followup serum potassium increased to 6.4 and then subsequently to 7.0. There was no evidence of hemolysis. The patient was not given potassium chloride in his IV fluids. Nevertheless, he was started on treatment with dextrose and insulin; IV Lasix; calcium gluconate; amp of bicarbonate followed by a gentle bicarbonate drip, and albuterol nebulizers. His serum potassium has now normalized.  Given the critical nature and severity of the patient's presentation, Dr. Jena Gauss discussed the patient's status with his [facility] hepatologist, Dr. Marcheta Grammes. Following their discussion, Dr. Marcheta Grammes felt that the patient needed to be transferred to [facility] to expedite TIPS. Dr. Jena Gauss discussed the transfer with the MICU attending at [facility], Dr. Donnie Aho. He kindly accepted the patient. The patient will be placed #1 on the wait list and will be air transported as soon as possible.  Dr. Jena Gauss discussed all of the above with the dictating physician and the patient's wife. We are all in agreement with the plan as stated.   Discharge Exam: Exam unchanged from previous progress note. Blood pressure 97/66, pulse 88, temperature 98.3 F (36.8 C), temperature source Axillary, resp. rate 18, height 5\' 7"  (1.702 m), weight 111.2 kg (245 lb 2.4 oz), SpO2 96.00%.         Discharge Orders    Future Appointments: Provider: Department: Dept Phone: Center:   02/01/2013 10:00 AM [facility] [phone] None   02/03/2013 11:30 AM Ellouise Newer, [place] [facility] [phone] None  Total discharge time: Greater than 30 minutes.   Signed: Braydyn Schultes 12/24/2012, 5:42 PM

## 2012-12-24 NOTE — Progress Notes (Signed)
Contacted Dr. Rito Ehrlich regarding CBG of 257 and had 2 CBG previously greater that 250. Dr Rito Ehrlich gave orders for 8 units for CBG greater than 250.

## 2012-12-24 NOTE — ED Notes (Signed)
Patient up to bedside commode.

## 2012-12-24 NOTE — Progress Notes (Signed)
First unit prbc completed w/o any observed transfusion reaction. Pt now has picc line and art lines in place.

## 2012-12-24 NOTE — ED Notes (Signed)
Patient unable to void for urine specimen.

## 2012-12-24 NOTE — Progress Notes (Signed)
INITIAL NUTRITION ASSESSMENT  DOCUMENTATION CODES Per approved criteria  -Obesity Unspecified   INTERVENTION: Follow pt vent status and progression of appropriateness for nutrition provision    NUTRITION DIAGNOSIS: Inadequate oral intake related to inability to eat as evidenced by NPO status.  Monitor:  Vent status and diet advancement   Reason for Assessment: Vent status  57 y.o. male  Admitting Dx: Upper GI bleed  ASSESSMENT: Pt intubated for airway protection. He  is s/p EGD ; variceal hemorrhage treated with banding.  MV: 9 ml Temp:Temp (24hrs), Avg:97.5 F (36.4 C), Min:97.2 F (36.2 C), Max:97.6 F (36.4 C)  Propofol: none   Height: Ht Readings from Last 1 Encounters:  12/24/12 5\' 7"  (1.702 m)    Weight: Wt Readings from Last 1 Encounters:  12/24/12 245 lb 2.4 oz (111.2 kg)    Ideal Body Weight: 148# (67.2 kg)  % Ideal Body Weight: 165%  Wt Readings from Last 10 Encounters:  12/24/12 245 lb 2.4 oz (111.2 kg)  12/24/12 245 lb 2.4 oz (111.2 kg)  09/28/12 234 lb 14.4 oz (106.55 kg)  09/03/12 236 lb (107.049 kg)  05/24/12 230 lb (104.327 kg)  05/24/12 230 lb (104.327 kg)  05/18/12 230 lb 9.6 oz (104.599 kg)  05/11/12 232 lb (105.235 kg)  05/10/12 228 lb (103.42 kg)  04/27/12 227 lb (102.967 kg)    Usual Body Weight: 230-245# past 7 months  % Usual Body Weight: 100%  BMI:  Body mass index is 38.40 kg/(m^2). Obesity Class II  Estimated Nutritional Needs: Kcal: 1942 (underfeeding goal:1474-1608 kcal/d) Protein:134-167 gr  Fluid: >2000 ml/day  Skin: no issues noted  Diet Order: NPO  EDUCATION NEEDS: -No education needs identified at this time   Intake/Output Summary (Last 24 hours) at 12/24/12 1115 Last data filed at 12/24/12 0900  Gross per 24 hour  Intake 3125.06 ml  Output    150 ml  Net 2975.06 ml    Last BM: 12/24/12 bloody stool  Labs:   Lab 12/24/12 0701 12/24/12 0547 12/23/12 2350  NA 137 139 135  K 7.0* 6.4* 4.4  CL  111 113* 100  CO2 22 22 26   BUN 19 18 20   CREATININE 0.96 0.94 1.05  CALCIUM 6.7* 7.0* 9.3  MG -- -- --  PHOS -- -- --  GLUCOSE 179* 158* 256*    CBG (last 3)   Basename 12/24/12 0744 12/24/12 0535  GLUCAP 165* 126*    Scheduled Meds:   . albuterol  2.5 mg Nebulization Q4H  . antiseptic oral rinse  15 mL Mouth Rinse QID  . ceFEPime (MAXIPIME) IV  1 g Intravenous Q12H  . chlorhexidine  15 mL Mouth Rinse BID  . ethanolamine      . insulin aspart  2-6 Units Subcutaneous Q4H  . insulin glargine  40 Units Subcutaneous Daily  . sodium chloride  10-40 mL Intracatheter Q12H  . vancomycin  1,000 mg Intravenous Q8H    Continuous Infusions:   . dextrose 5 % and 0.45% NaCl 1,000 mL with sodium bicarbonate 50 mEq infusion 200 mL/hr at 12/24/12 0900  . DOPamine 10 mcg/kg/min (12/24/12 1039)  . midazolam (VERSED) infusion 2 mg/hr (12/24/12 0630)  . midazolam (VERSED) infusion 4 mg/hr (12/24/12 0900)  . octreotide (SANDOSTATIN) infusion 50 mcg/hr (12/24/12 0629)  . pantoprozole (PROTONIX) infusion 8 mg/hr (12/24/12 0900)    Past Medical History  Diagnosis Date  . DM (diabetes mellitus)   . Cirrhosis     bx proven steatohepatitis with cirrhosis (2010);  per note from Feb 2011, received Hep A and B vaccines in 2010  . HTN (hypertension)   . RLS (restless legs syndrome)   . Sleep apnea   . Hyperlipidemia   . IDA (iron deficiency anemia)   . GERD (gastroesophageal reflux disease)   . Depression   . Peripheral neuropathy   . Urothelial cancer     2010, paillary low-grade, h/o recurrence 2011  . B12 deficiency   . Psoriasis   . 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 Anemia due to multiple mechanisms 05/13/2011  . History of alcohol abuse 05/13/2011  . ANEMIA-IRON DEFICIENCY 03/09/2009  . S/P endoscopy May 2012    4 columns grade II esophageal varices; due for repeat in Nov 2013   . S/P colonoscopy May 2012    Tubular adenoma  . Low back pain   . Cancer     bladder  ca 05/2009 removal and  w/chemo wash  . Esophageal varices with bleeding 11/24/11    s/p emergent EGD 11/25/11 by Dr. Rhea Belton at [facility], Grade III esophageal varices s/p banding X 5  . Small bowel obstruction 02/15/2012    Admitted to [facility], managed by Dr. Leticia Penna, ventral hernia manually reduced  . Ventral hernia 02/15/12  . MRSA (methicillin resistant Staphylococcus aureus)   . Acute post-ligation esophageal ulcer with hemorrhage 04/11/2012  . Sleep apnea   . Clostridium difficile infection   . Cirrhosis of liver without mention of alcohol     has received hep A/hep B vaccines, afp-03/26/11= <1.3, ct abd 06/25/12= cirrhosis and portal venous hypertension with hepatomegaly  . H. pylori infection 12/24/2012    Past Surgical History  Procedure Date  . Carpel tunnel   . Shoulder surgery   . Adrenal mass surgery 05/2009    benign, left  . Bladder surgery 01/2009 and 06/2010    cancer 2010, small recurrence in 06/2010  . Colonoscopy 04/2009    moderate int hemorrhoids, rare sigmoid diverticula, one mm sessile hyperplastic rectal polyp  . Esophagogastroduodenoscopy 03/2009    small hh  . Small bowel capsule endoscopy 03/2009    couple of small benign appearing erosions, nonbleeding  . Egd/tcs 08/2007    small hiatal hernia, pancolonic diverticula, friable anal canal, 3cm salmon colored epithelium in distal esophagus, bx negative for Barrett's  . Skin cancer excision Oct 2012    left arm  . Esophagogastroduodenoscopy 11/25/2011    Dr. Vedia Coffer III varices in the mid and distal esophagus, banding placed, portal astropathy  . Esophagogastroduodenoscopy 01/14/2012    Dr Rourk->4-5 columns Gr2 varices, 6 bands placed, HH, distal esophageal ulcer, portal gastropathy, antral erosions  . Esophagogastroduodenoscopy 03/17/2012    Dr. Rito Ehrlich varices status post band ligation. Hiatal hernia. Portal gastropathy.  . Umbilical hernia repair 04/05/2012    Procedure: HERNIA REPAIR UMBILICAL ADULT;  Surgeon:  Fabio Bering, MD;  Location: [facility];  Service: General;  Laterality: N/A;  . Esophagogastroduodenoscopy 04/11/2012    Dr. Lysle Pearl ulcer possibly at previous banding sites with stigmata     of bleeding.  Attempt at hemostasis with hemoclip and banding was not successful resulting in recurrence of bleed/  Portal gastropathy, but no evidence of gastric varices/ Recurrent esophageal varices grade 2.  . Esophagogastroduodenoscopy 05/24/2012    Dr. Rito Ehrlich varices, portal gastropathy  . Esophagogastroduodenoscopy 04/11/12    Dr. Lorella Nimrod in the distal esophagus adherent lot and visible vessel treated with bicap. grade I varices in the distal esophagus, one ligating band placed, portal hypertensive  gastropathy in the body of the stomach.  . Colonoscopy 09/29/2012    Procedure: COLONOSCOPY;  Surgeon: Corbin Ade, MD;  Location: [facility];  Service: Endoscopy;  Laterality: N/A;  8:30 am    [phone]

## 2012-12-24 NOTE — Op Note (Signed)
NAME:  Kyle [name], SPISAK NO.:  [id number]  MEDICAL RECORD NO.:  [id number]  LOCATION:  IC04                          FACILITY:  [facility]  PHYSICIAN:  R. 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  He has known esophageal varices refractory to esophageal band ligation therapy.  He has bled on multiple occasions and has been seen by multiple gastroenterologists here in [place].  He was scheduled to get a TIPS placed next month.  He has presented with hemodynamically significant upper GI bleed.  He has been intubated for airway protection.  EGD is now being done with therapeutic intent. Risks, benefits, limitations, alternatives, and imponderables have been discussed with patient and the patient's wife down in the emergency department.  All parties questions answered, all parties agreeable.  PROCEDURE NOTE:  O2 saturation, blood pressure, pulse, respirations monitored throughout the entire procedure.  The patient intubated and on an IV Versed drip.  INSTRUMENTATION:  Pentax video chip gastroscopes x2, one loaded for diagnostic purposes/sclerotherapy and one for banding.  FINDINGS:  The patient had fresh blood flowing out of this mouth around the ET tube.  At the outset of the procedure, there was fresh blood filling the lumen of the esophagus.  I identified a spurting varix in the mid esophagus, please see photographs.  I could not get  very good Visualization at all with washing and could just barely see the site of bleeding.  I felt that the band ligation would be difficult as the first maneuver employed and in light of his prior difficulties with esophageal band ligation, I elected to perform sclerotherapy.  I subsequently injected 2 mL of 5% ethanolamine into this  area and slowed the bleeding considerably.  I withdrew the scope and introduced the diagnostic gastroscope and had the banding apparatus loaded on it.  Some time was spent with lavage and suctioning out the blood from the esophagus. The patient was noted to have grade 3 multilobulated varices coming up halfway into the tubular esophagus, with multiple Whale marks on other areas of the varices.  The injected varix continued to ooze.  Multiple bands were applied on multiple columns of esophageal varices including the one treated with sclerosan.  There was additional oozing seen in a large varix distal to the sentinel bleeding varix.  During the procedure, we had difficulties with the wall suction completely failing transiently.  As this occurred, I withdrew the scope, loaded with the banding unit and went back with the other scope. The sclerotherapy needle was advanced through it and I went down into the distal esophagus and injected additional 1 mL aliquots of sclerosant and the remaining columns of varices.  This slowed the bleeding considerably.  I then withdrew the scope, and obtained the scope with the new banding unit, went back down into the more distal esophagus, and applied additional bands to the variceal columns; 1 varix appeared to have received 2 bands.  At the conclusion of the above-mentioned  maneuvers,there appeared to be good hemostasis achieved.  Please note, the stomach was briefly inspected, it was full of blood.  I did not attempt to carry out a complete examination.  The patient appears to have tolerated the procedure well.  He received a second unit of packed red blood cells during the procedure.  He will be monitored closely in the ICU.  IMPRESSION:  Hemodynamically significant upper GI bleed secondary to bleeding esophageal varices.  Status post hemostasis therapy with injection of a total of 9 mL of 5% ethanolamine and application of 9 bands, complete EGD not carried out  as described above.  RECOMMENDATIONS: 1. Keep intubated to continue a PPI and Sandostatin. 2. Continue IV ceftriaxone. 3. Follow H and H. 4. Follow up EGD and TIPS in the near future.     [name], MD FACP [facility]     RMR/MEDQ  D:  12/24/2012  T:  12/24/2012  Job:  [id number]

## 2012-12-24 NOTE — Progress Notes (Signed)
I have attempted to contact the patient's [facility] hepatologist, Dr. Georgiana Shore [phone]) to update him on patient status but have not reached him, as of yet.  Hopefully, we'll speak with him later today.

## 2012-12-24 NOTE — Progress Notes (Signed)
Subjective: Pt intubated, sedated, nonverbal.  Hgb 9.3 s/p 2 units PRBCs.  2 more units on hold.  K 7.0.    Objective: Vital signs in last 24 hours: Temp:  [97.2 F (36.2 C)-97.9 F (36.6 C)] 97.2 F (36.2 C) (01/31 0726) Pulse Rate:  [61-90] 62  (01/31 0700) Resp:  [15-25] 16  (01/31 0700) BP: (73-138)/(45-73) 76/51 mmHg (01/31 0645) SpO2:  [94 %-100 %] 100 % (01/31 0700) FiO2 (%):  [100 %] 100 % (01/31 0309) Weight:  [233 lb (105.688 kg)-245 lb 2.4 oz (111.2 kg)] 245 lb 2.4 oz (111.2 kg) (01/31 0315) Last BM Date: 12/24/12 No LMP for male patient. Body mass index is 38.40 kg/(m^2). General:  Intubated, sedated, nonverbal on vent Eyes:   Conjunctiva pale. Mouth:  ET tube in place, large amount old red blood in suction container from EGD Heart:  Regular rate and rhythm Abdomen:   Normal bowel sounds.  Soft, mildly distended. +umbilical hernia, easily reducible.  Non-tender. Msk:  Symmetrical without gross deformities.  Pulses:  Normal pulses noted. Extremities:  2+ ankle edema bilat Psych:  Sedated.  Intake/Output from previous day: 01/30 0701 - 01/31 0700 In: 1965.1 [I.V.:1508.6; Blood:406.5; IV Piggyback:50] Out: 150 [Urine:150]  Lab Results:  [facility] 12/24/12 0547 12/23/12 2350 12/21/12 0957  WBC 11.2* 6.8 4.1  HGB 9.3* 11.1* 13.3  HCT 28.0* 33.7* 40.0  PLT 94* 89* 70*   BMET  Basename 12/24/12 0701 12/24/12 0547 12/23/12 2350  NA 137 139 135  K 7.0* 6.4* 4.4  CL 111 113* 100  CO2 22 22 26   GLUCOSE 179* 158* 256*  BUN 19 18 20   CREATININE 0.96 0.94 1.05  CALCIUM 6.7* 7.0* 9.3   LFT  Basename 12/24/12 0547 12/23/12 2350  PROT 5.0* 7.1  ALBUMIN 2.2* 3.0*  AST 37 37  ALT 27 34  ALKPHOS 45 69  BILITOT 0.7 0.6  BILIDIR -- --  IBILI -- --  LIPASE -- --  AMYLASE -- --   PT/INR  Basename 12/23/12 2350  LABPROT 16.5*  INR 1.37   Studies/Results: Dg Chest Portable 1 View  12/24/2012  *RADIOLOGY REPORT*  Clinical Data: Intubated  PORTABLE CHEST - 1  VIEW  Comparison: 12/24/2012  Findings: Endotracheal tube tip 3.3 cm proximal to the carina. Prominent cardiomediastinal contours.  Bilateral interstitial and airspace opacities, most pronounced in the left upper lung. Hypoaeration.  No pneumothorax.  No acute osseous finding.  IMPRESSION: Endotracheal tube tip 3.2 cm proximal to the carina.  Bilateral interstitial and airspace opacities; edema or pneumonia.   Original Report Authenticated By: Jearld Lesch, M.D.    Dg Abd Acute W/chest  12/24/2012  *RADIOLOGY REPORT*  Clinical Data: Abdominal pain and vomiting.  Hematemesis.  ACUTE ABDOMEN SERIES (ABDOMEN 2 VIEW & CHEST 1 VIEW)  Comparison: 04/13/2012 and prior radiographs.  06/25/2012 CT  Findings: Upper limits normal heart size identified. There is no evidence of airspace disease, pleural effusion or pneumothorax.  A paucity of bowel gas is noted with only a small amount of gas noted in the colon. Fluid and gas-filled stomach is noted. There is no evidence of pneumoperitoneum. No acute bony abnormalities are identified.,  IMPRESSION: Nonspecific bowel gas pattern with paucity of bowel gas and fluid and gas filled stomach.  CT may be helpful for further evaluation as clinically indicated.  No evidence of acute cardiopulmonary disease.   Original Report Authenticated By: Harmon Pier, M.D.     Assessment: 1. GI bleed secondary to esophageal varices:  S/p incomplete EGD w/ sclerotherapy & 9 bands placed this AM. 2. Anemia secondary to #1:  Hgb 9.3 currently s/p 2 units.  Will need close monitoring.  2 additional units on standby. 3. Decompensated [name] cirrhosis:  Guarded condition.  Plans for TIPS next month. 4. Hyperkalemia (7.0):  Per attending.  Plan: 1. Continue sandostatin gtt 2. Continue protonix gtt 3. Monitor H/H transfuse as necessary to keep H/H around 9 grams 4. Hyperkalemia to be addressed by hospitalist 5. Continue IV Rocephin 6. Complete EGD in near future  LOS: 1 day   Corbet Hanley,  Urijah Arko  12/24/2012, 8:11 AM  11:24 AM Addendum:  Hgb 7.9.  1 unit PRBCs ordered.  Keep 2 additional units on hold.  Arline Asp, RN aware. Lorenza Burton

## 2012-12-24 NOTE — ED Provider Notes (Signed)
History     CSN: [id number]  Arrival date & time 12/23/12  2339   First MD Initiated Contact with Patient 12/23/12 2341      Chief Complaint  Patient presents with  . Hematemesis    (Consider location/radiation/quality/duration/timing/severity/associated sxs/prior treatment) HPI Pt with history of cirrhosis and esophageal varices with UGI bleeding in the past reports onset of hematemesis about prior to arrival. HE reports moderate diffuse aching abdominal pain. Bloody emesis is bright red. No change in bowels recently. He has been followed at [facility] and is approved for TIPS procedure, but unsure when it will be done. He was also recently started on medication for H. Pylori found in a stool specimen taken by local GI doctor. He has also been getting Ferritin infusions, last was earlier today.   Past Medical History  Diagnosis Date  . DM (diabetes mellitus)   . Cirrhosis     bx proven steatohepatitis with cirrhosis (2010); per note from Feb 2011, received Hep A and B vaccines in 2010  . HTN (hypertension)   . RLS (restless legs syndrome)   . Sleep apnea   . Hyperlipidemia   . IDA (iron deficiency anemia)   . GERD (gastroesophageal reflux disease)   . Depression   . Peripheral neuropathy   . Urothelial cancer     2010, paillary low-grade, h/o recurrence 2011  . B12 deficiency   . Psoriasis   . 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 Anemia due to multiple mechanisms 05/13/2011  . History of alcohol abuse 05/13/2011  . ANEMIA-IRON DEFICIENCY 03/09/2009  . S/P endoscopy May 2012    4 columns grade II esophageal varices; due for repeat in Nov 2013   . S/P colonoscopy May 2012    Tubular adenoma  . Low back pain   . Cancer     bladder ca 05/2009 removal and  w/chemo wash  . Esophageal varices with bleeding 11/24/11    s/p emergent EGD 11/25/11 by Dr. Rhea Belton at [facility], Grade III esophageal varices s/p banding X 5  . Small bowel obstruction 02/15/2012    Admitted to [facility],  managed by Dr. Leticia Penna, ventral hernia manually reduced  . Ventral hernia 02/15/12  . MRSA (methicillin resistant Staphylococcus aureus)   . Acute post-ligation esophageal ulcer with hemorrhage 04/11/2012  . Sleep apnea   . Clostridium difficile infection   . Cirrhosis of liver without mention of alcohol     has received hep A/hep B vaccines, afp-03/26/11= <1.3, ct abd 06/25/12= cirrhosis and portal venous hypertension with hepatomegaly    Past Surgical History  Procedure Date  . Carpel tunnel   . Shoulder surgery   . Adrenal mass surgery 05/2009    benign, left  . Bladder surgery 01/2009 and 06/2010    cancer 2010, small recurrence in 06/2010  . Colonoscopy 04/2009    moderate int hemorrhoids, rare sigmoid diverticula, one mm sessile hyperplastic rectal polyp  . Esophagogastroduodenoscopy 03/2009    small hh  . Small bowel capsule endoscopy 03/2009    couple of small benign appearing erosions, nonbleeding  . Egd/tcs 08/2007    small hiatal hernia, pancolonic diverticula, friable anal canal, 3cm salmon colored epithelium in distal esophagus, bx negative for Barrett's  . Skin cancer excision Oct 2012    left arm  . Esophagogastroduodenoscopy 11/25/2011    Dr. Vedia Coffer III varices in the mid and distal esophagus, banding placed, portal astropathy  . Esophagogastroduodenoscopy 01/14/2012    Dr Rourk->4-5 columns Gr2 varices, 6  bands placed, HH, distal esophageal ulcer, portal gastropathy, antral erosions  . Esophagogastroduodenoscopy 03/17/2012    Dr. Rito Ehrlich varices status post band ligation. Hiatal hernia. Portal gastropathy.  . Umbilical hernia repair 04/05/2012    Procedure: HERNIA REPAIR UMBILICAL ADULT;  Surgeon: Fabio Bering, MD;  Location: [facility];  Service: General;  Laterality: N/A;  . Esophagogastroduodenoscopy 04/11/2012    Dr. Lysle Pearl ulcer possibly at previous banding sites with stigmata     of bleeding.  Attempt at hemostasis with hemoclip and banding was not  successful resulting in recurrence of bleed/  Portal gastropathy, but no evidence of gastric varices/ Recurrent esophageal varices grade 2.  . Esophagogastroduodenoscopy 05/24/2012    Dr. Rito Ehrlich varices, portal gastropathy  . Esophagogastroduodenoscopy 04/11/12    Dr. Lorella Nimrod in the distal esophagus adherent lot and visible vessel treated with bicap. grade I varices in the distal esophagus, one ligating band placed, portal hypertensive gastropathy in the body of the stomach.  . Colonoscopy 09/29/2012    Procedure: COLONOSCOPY;  Surgeon: Corbin Ade, MD;  Location: [facility];  Service: Endoscopy;  Laterality: N/A;  8:30 am    Family History  Problem Relation Age of Onset  . Cirrhosis Father     etoh  . Colon cancer Neg Hx   . Anesthesia problems Neg Hx   . Hypotension Neg Hx   . Malignant hyperthermia Neg Hx   . Pseudochol deficiency Neg Hx   . Kidney cancer Mother   . Cancer Mother   . HIV Brother   . Cirrhosis Brother     nash    History  Substance Use Topics  . Smoking status: Current Every Day Smoker -- 1.0 packs/day for 30 years    Types: Cigarettes  . Smokeless tobacco: Not on file  . Alcohol Use: No     Comment: drank heavily for few years in 20s      Review of Systems All other systems reviewed and are negative except as noted in HPI.   Allergies  Aspirin; Ibuprofen; and Tylenol  Home Medications   Current Outpatient Rx  Name  Route  Sig  Dispense  Refill  . BIS SUBCIT-METRONID-TETRACYC 140-125-125 MG PO CAPS   Oral   Take 3 capsules by mouth 4 (four) times daily -  before meals and at bedtime. For 10 days.   120 capsule   0   . FUROSEMIDE 20 MG PO TABS   Oral   Take 20 mg by mouth daily.          [name] GABAPENTIN 300 MG PO CAPS   Oral   Take 600-900 mg by mouth 2 (two) times daily. Takes 2 at dinner time and 3 before bedtime.         [name] HYOSCYAMINE SULFATE 0.125 MG SL SUBL   Sublingual   Place 0.125 mg under the tongue every 4  (four) hours as needed. Cramping.         . INSULIN ASPART 100 UNIT/ML [place] SOLN   Subcutaneous   Inject 10-40 Units into the skin 3 (three) times daily before meals. Sliding Scale         . INSULIN GLARGINE 100 UNIT/ML [place] SOLN   Subcutaneous   Inject 60 Units into the skin 2 (two) times daily.          [name] LISINOPRIL 10 MG PO TABS   Oral   Take 10 mg by mouth daily.         [name] METFORMIN  HCL 1000 MG PO TABS   Oral   Take 1,000 mg by mouth 2 (two) times daily with a meal.         . MULTIVITAMINS PO CAPS   Oral   Take 1 capsule by mouth daily.           [name] FISH OIL 1200 MG PO CAPS   Oral   Take 1 capsule by mouth 3 (three) times daily.         [name] OMEPRAZOLE 40 MG PO CPDR   Oral   Take 1 capsule (40 mg total) by mouth daily.   30 capsule   11   . PEG-KCL-NACL-NASULF-NA ASC-C 100 G PO SOLR   Oral   Take 1 kit (100 g total) by mouth as directed.   1 kit   0   . PRAMIPEXOLE DIHYDROCHLORIDE 0.5 MG PO TABS   Oral   Take 0.5 mg by mouth at bedtime.          [name] PROPRANOLOL HCL 80 MG PO TABS   Oral   Take 1 tablet (80 mg total) by mouth 2 (two) times daily.   60 tablet   5   . SIMVASTATIN 20 MG PO TABS   Oral   Take 20 mg by mouth every evening.         . TRAZODONE HCL 50 MG PO TABS   Oral   Take 100 mg by mouth at bedtime.            BP 90/62  Pulse 83  Temp 97.5 F (36.4 C) (Oral)  Resp 20  Ht 5\' 7"  (1.702 m)  Wt 233 lb (105.688 kg)  BMI 36.49 kg/m2  SpO2 94%  Physical Exam  Nursing note and vitals reviewed. Constitutional: He is oriented to person, place, and time. He appears well-developed and well-nourished.  HENT:  Head: Normocephalic and atraumatic.  Eyes: EOM are normal. Pupils are equal, round, and reactive to light.  Neck: Normal range of motion. Neck supple.  Cardiovascular: Normal rate, normal heart sounds and intact distal pulses.   Pulmonary/Chest: Effort normal and breath sounds normal.  Abdominal: Bowel sounds are normal.  He exhibits no distension. There is tenderness (diffuse moderate). There is no rebound and no guarding.  Musculoskeletal: Normal range of motion. He exhibits no edema and no tenderness.  Neurological: He is alert and oriented to person, place, and time. He has normal strength. No cranial nerve deficit or sensory deficit.  Skin: Skin is warm and dry. No rash noted.  Psychiatric: He has a normal mood and affect.    ED Course  Procedures (including critical care time)  Labs Reviewed  CBC WITH DIFFERENTIAL - Abnormal; Notable for the following:    RBC 3.64 (*)     Hemoglobin 11.1 (*)     HCT 33.7 (*)     Platelets 89 (*)     All other components within normal limits  COMPREHENSIVE METABOLIC PANEL - Abnormal; Notable for the following:    Glucose, Bld 256 (*)     Albumin 3.0 (*)     GFR calc non Af Amer 78 (*)     All other components within normal limits  PROTIME-INR - Abnormal; Notable for the following:    Prothrombin Time 16.5 (*)     All other components within normal limits  TYPE AND SCREEN  APTT  URINALYSIS, ROUTINE W REFLEX MICROSCOPIC  PREPARE RBC (CROSSMATCH)   No results found.   No diagnosis  found.    MDM  Pt with several large bright red episodes of emesis. SBP is holding steady around 90. Discussed with Dr. Jena Gauss who is familiar with the patient and will come to the ED to evaluate him. Admit to Hospitalist. Protonix, Octreotide and PRBC infusions have been ordered. Pt remains awake and alert.   CRITICAL CARE Performed by: Pollyann Savoy   Total critical care time: 45  Critical care time was exclusive of separately billable procedures and treating other patients.  Critical care was necessary to treat or prevent imminent or life-threatening deterioration.  Critical care was time spent personally by me on the following activities: development of treatment plan with patient and/or surrogate as well as nursing, discussions with consultants, evaluation of  patient's response to treatment, examination of patient, obtaining history from patient or surrogate, ordering and performing treatments and interventions, ordering and review of laboratory studies, ordering and review of radiographic studies, pulse oximetry and re-evaluation of patient's condition.   2:01 AM Pt seen by Dr. Jena Gauss and Dr. Barnie Del who will admit. Pt had transient drop in BP improved with additional IVF bolus and lying flat. Per Dr. Luvenia Starch request, pt intubated for airway protection prior to endoscopy. Pt and wife given consent.   INTUBATION Performed by: Pollyann Savoy  Required items: required blood products, implants, devices, and special equipment available Patient identity confirmed: provided demographic data and hospital-assigned identification number Time out: Immediately prior to procedure a "time out" was called to verify the correct patient, procedure, equipment, support staff and site/side marked as required.  Indications: airway protection  Intubation method: Glidescope Laryngoscopy   Preoxygenation: BVM  Sedatives: Etomidate Paralytic: Succinylcholine  Tube Size: 7.5 cuffed  Post-procedure assessment: chest rise and ETCO2 monitor Breath sounds: equal and absent over the epigastrium Tube secured with: ETT holder Chest x-ray interpreted by radiologist and me.  Chest x-ray findings: endotracheal tube in appropriate position  Patient tolerated the procedure well with no immediate complications.         Janille Draughon B. Bernette Mayers, MD 12/24/12 [phone]

## 2012-12-24 NOTE — Progress Notes (Signed)
Finally made contact with Dr. Marcheta Grammes; discussed case with him.He feels pt needs to be transferred to [facility] to expedite TIPS when relatively stable.  At this time, pt does appear relatively stable with systolic BP of 107 and pulse 87 on Dopamine 10 mics / IV PPI/ Bicarbonate/ octreatide.  No further melena or hematemesis.  I have put in a call back to [facility] to speak with attending for ICU admits (its not Dr. Marcheta Grammes) to initiate transfer process.  I've discussed with Dr. Sherrie Mustache. I have called spouse but unable to make contact as of yet.

## 2012-12-24 NOTE — Progress Notes (Signed)
REVIEWED.  Repeat EGD/?EVL IN 7-14 DAYS IF PT DOES NOT RE-BLEED. CONSIDER TIPS ASAP. DISCUSSED WITH DR. Jena Gauss. HE WILL CONTACT [facility]

## 2012-12-24 NOTE — ED Notes (Signed)
Patient vomiting  Bright red blood at this time with noted bloodclots

## 2012-12-24 NOTE — Progress Notes (Signed)
Subjective: The patient is intubated and sedated. His wife is in the room. Questions answered.  Objective: Vital signs in last 24 hours: Filed Vitals:   12/24/12 0645 12/24/12 0700 12/24/12 0726 12/24/12 0801  BP: 76/51     Pulse: 61 62    Temp:   97.2 F (36.2 C)   TempSrc:   Axillary   Resp: 17 16    Height:      Weight:      SpO2: 100% 100%  100%    Intake/Output Summary (Last 24 hours) at 12/24/12 0850 Last data filed at 12/24/12 0631  Gross per 24 hour  Intake 1965.06 ml  Output    150 ml  Net 1815.06 ml    Weight change:   Physical exam: General: Obese 57 year old Caucasian man laying in bed sedated and intubated. HEENT: Head is normocephalic, nontraumatic. Pupils are sluggish, but equal and reactive to light. ET tube noted within the oral cavity. Neck: Supple, no adenopathy, no thyromegaly. Lungs: Bilateral rhonchi. Heart: S1, S2, no murmurs rubs or gallops. Abdomen: Obese, hypoactive bowel sounds, mildly distended, nontender. Extremities: Trace of pedal edema bilaterally.  Neurologic: He is sedated and intubated. His pupils are sluggish but reactive to light. He will otherwise be examined neurologically when he is less sedated on the ventilator.  Lab Results: Basic Metabolic Panel:  Basename 12/24/12 0701 12/24/12 0547  NA 137 139  K 7.0* 6.4*  CL 111 113*  CO2 22 22  GLUCOSE 179* 158*  BUN 19 18  CREATININE 0.96 0.94  CALCIUM 6.7* 7.0*  MG -- --  PHOS -- --   Liver Function Tests:  Basename 12/24/12 0547 12/23/12 2350  AST 37 37  ALT 27 34  ALKPHOS 45 69  BILITOT 0.7 0.6  PROT 5.0* 7.1  ALBUMIN 2.2* 3.0*   No results found for this basename: LIPASE:2,AMYLASE:2 in the last 72 hours No results found for this basename: AMMONIA:2 in the last 72 hours CBC:  Basename 12/24/12 0547 12/23/12 2350 12/21/12 0957  WBC 11.2* 6.8 --  NEUTROABS -- 3.9 2.3  HGB 9.3* 11.1* --  HCT 28.0* 33.7* --  MCV 93.3 92.6 --  PLT 94* 89* --   Cardiac  Enzymes: No results found for this basename: CKTOTAL:3,CKMB:3,CKMBINDEX:3,TROPONINI:3 in the last 72 hours BNP: No results found for this basename: PROBNP:3 in the last 72 hours D-Dimer: No results found for this basename: DDIMER:2 in the last 72 hours CBG:  Basename 12/24/12 0744 12/24/12 0535  GLUCAP 165* 126*   Hemoglobin A1C: No results found for this basename: HGBA1C in the last 72 hours Fasting Lipid Panel: No results found for this basename: CHOL,HDL,LDLCALC,TRIG,CHOLHDL,LDLDIRECT in the last 72 hours Thyroid Function Tests: No results found for this basename: TSH,T4TOTAL,FREET4,T3FREE,THYROIDAB in the last 72 hours Anemia Panel:  Basename 12/21/12 0957  VITAMINB12 --  FOLATE --  FERRITIN 20*  TIBC --  IRON --  RETICCTPCT --   Coagulation:  Basename 12/23/12 2350  LABPROT 16.5*  INR 1.37   Urine Drug Screen: Drugs of Abuse  No results found for this basename: labopia,  cocainscrnur,  labbenz,  amphetmu,  thcu,  labbarb    Alcohol Level: No results found for this basename: ETH:2 in the last 72 hours Urinalysis:  Basename 12/24/12 0532  COLORURINE YELLOW  LABSPEC 1.025  PHURINE 6.0  GLUCOSEU NEGATIVE  HGBUR MODERATE*  BILIRUBINUR SMALL*  KETONESUR NEGATIVE  PROTEINUR 30*  UROBILINOGEN 0.2  NITRITE NEGATIVE  LEUKOCYTESUR NEGATIVE   Misc. Labs:  Micro: Recent Results (from the past 240 hour(s))  MRSA PCR SCREENING     Status: Normal   Collection Time   12/24/12  2:58 AM      Component Value Range Status Comment   MRSA by PCR NEGATIVE  NEGATIVE Final     Studies/Results: Dg Chest Portable 1 View  12/24/2012  *RADIOLOGY REPORT*  Clinical Data: Intubated  PORTABLE CHEST - 1 VIEW  Comparison: 12/24/2012  Findings: Endotracheal tube tip 3.3 cm proximal to the carina. Prominent cardiomediastinal contours.  Bilateral interstitial and airspace opacities, most pronounced in the left upper lung. Hypoaeration.  No pneumothorax.  No acute osseous finding.   IMPRESSION: Endotracheal tube tip 3.2 cm proximal to the carina.  Bilateral interstitial and airspace opacities; edema or pneumonia.   Original Report Authenticated By: Jearld Lesch, M.D.    Dg Abd Acute W/chest  12/24/2012  *RADIOLOGY REPORT*  Clinical Data: Abdominal pain and vomiting.  Hematemesis.  ACUTE ABDOMEN SERIES (ABDOMEN 2 VIEW & CHEST 1 VIEW)  Comparison: 04/13/2012 and prior radiographs.  06/25/2012 CT  Findings: Upper limits normal heart size identified. There is no evidence of airspace disease, pleural effusion or pneumothorax.  A paucity of bowel gas is noted with only a small amount of gas noted in the colon. Fluid and gas-filled stomach is noted. There is no evidence of pneumoperitoneum. No acute bony abnormalities are identified.,  IMPRESSION: Nonspecific bowel gas pattern with paucity of bowel gas and fluid and gas filled stomach.  CT may be helpful for further evaluation as clinically indicated.  No evidence of acute cardiopulmonary disease.   Original Report Authenticated By: Harmon Pier, M.D.     Medications:  Scheduled:    . albuterol  2.5 mg Nebulization Q4H  . antiseptic oral rinse  15 mL Mouth Rinse QID  . calcium gluconate  1 g Intravenous Once  . cefTRIAXone (ROCEPHIN)  IV  1 g Intravenous Q24H  . chlorhexidine  15 mL Mouth Rinse BID  . ethanolamine      . insulin aspart  2-6 Units Subcutaneous Q4H  . insulin glargine  40 Units Subcutaneous Daily   Continuous:    . dextrose 5 % and 0.45% NaCl 1,000 mL with sodium bicarbonate 50 mEq infusion 200 mL/hr at 12/24/12 0804  . midazolam (VERSED) infusion 2 mg/hr (12/24/12 0630)  . midazolam (VERSED) infusion    . octreotide (SANDOSTATIN) infusion 50 mcg/hr (12/24/12 0629)  . pantoprozole (PROTONIX) infusion 8 mg/hr (12/24/12 0600)   ZOX:[id number], midazolam, midazolam, ondansetron (ZOFRAN) IV, ondansetron  Assessment: Principal Problem:  *Upper GI bleed Active Problems:  Acute posthemorrhagic anemia   Hemorrhagic shock  Respiratory failure with hypercapnia  DIABETES MELLITUS, TYPE II, CONTROLLED, WITH COMPLICATIONS  Esophageal varices  HEPATIC CIRRHOSIS  UTI (urinary tract infection)  Hyperkalemia  H. pylori infection  Microscopic hematuria   1. Upper GI bleeding secondary to esophageal variceal bleeding. Status post hemostasis therapy and banding of 9 varices, per Dr. Jena Gauss on 12/24/2012. We'll continue both of the octreotide drip and Protonix drip as ordered.  Acute posthemorrhagic anemia. Status post 2 units of packed red blood cell transfusions. We'll continue to monitor his CBC every 6 hours and transfuse accordingly.  Acute respiratory failure. The patient was then intubated primarily for airway protection, but is noted to have accommodation of respiratory and metabolic acidosis. We'll continue mechanical ventilation. Dr. Juanetta Gosling has been consulted for assistance with ventilator management.  Hypovolemia secondary to hemorrhagic shock. He is then transfuse 2 units of packed  red blood cells. His IV fluids have been titrated up. Low threshold for starting pressors.  Microcytic hematuria/we'll treat as urinary tract infection.  Hyperkalemia. Etiology is unknown at this time. Measures have been ordered to treat.  Type 2 diabetes mellitus. He is n.p.o. He is receiving dextrose in his IV fluids. Will adjust sliding scale NovoLog accordingly.  Plan:  1. Continue ventilator management. We'll follow Dr. Juanetta Gosling recommendations. 2. Continue treatment of variceal bleeding. We'll follow Dr. Luvenia Starch recommendations. 3. The patient was given Lasix, D50, insulin, albuterol nebulizer, and bicarbonate to treat his hyperkalemia. We'll continue bicarbonate drip. Will monitor his potassium levels throughout the day. 4. Increase IV fluids which have been done. Consider pressors and/or another packed red blood cell transfusion if his blood pressure does not improve. 5. Will broaden antibiotic therapy  with vancomycin and cefepime. 6. We'll order a chest x-ray in the morning.     Total critical care time: 45 minutes.     LOS: 1 day   Laiah Pouncey 12/24/2012, 8:50 AM

## 2012-12-24 NOTE — Progress Notes (Addendum)
Notified Dr. Rito Ehrlich of patient having a large bloody stool. Continue to monitor BP per MD.

## 2012-12-24 NOTE — Progress Notes (Signed)
Esophagogastroduodenoscopy with banding and sclerotherapy of Esophageal Varices at bedside ICU 4.

## 2012-12-24 NOTE — Progress Notes (Signed)
ANTIBIOTIC CONSULT NOTE - INITIAL  Pharmacy Consult for Vancomycin Indication: UTI, varices, in ICU and on ventilator  Allergies  Allergen Reactions  . Aspirin   . Ibuprofen Other (See Comments)    Can't take per Dr Jena Gauss- [facility]  . Tylenol (Acetaminophen) Other (See Comments)    Causes legs to "run"   Patient Measurements: Height: 5\' 7"  (170.2 cm) Weight: 245 lb 2.4 oz (111.2 kg) IBW/kg (Calculated) : 66.1   Vital Signs: Temp: 97.2 F (36.2 C) (01/31 0726) Temp src: Axillary (01/31 0726) BP: 81/49 mmHg (01/31 0900) Pulse Rate: 65  (01/31 0930) Intake/Output from previous day: 01/30 0701 - 01/31 0700 In: 2017.1 [I.V.:1560.6; Blood:406.5; IV Piggyback:50] Out: 150 [Urine:150] Intake/Output from this shift: Total I/O In: 1108 [I.V.:508; IV Piggyback:600] Out: -   Labs:  Basename 12/24/12 0701 12/24/12 0547 12/23/12 2350  WBC -- 11.2* 6.8  HGB -- 9.3* 11.1*  PLT -- 94* 89*  LABCREA -- -- --  CREATININE 0.96 0.94 1.05   Estimated Creatinine Clearance: 103.4 ml/min (by C-G formula based on Cr of 0.96). No results found for this basename: VANCOTROUGH:2,VANCOPEAK:2,VANCORANDOM:2,GENTTROUGH:2,GENTPEAK:2,GENTRANDOM:2,TOBRATROUGH:2,TOBRAPEAK:2,TOBRARND:2,AMIKACINPEAK:2,AMIKACINTROU:2,AMIKACIN:2, in the last 72 hours   Microbiology: Recent Results (from the past 720 hour(s))  MRSA PCR SCREENING     Status: Normal   Collection Time   12/24/12  2:58 AM      Component Value Range Status Comment   MRSA by PCR NEGATIVE  NEGATIVE Final     Medical History: Past Medical History  Diagnosis Date  . DM (diabetes mellitus)   . Cirrhosis     bx proven steatohepatitis with cirrhosis (2010); per note from Feb 2011, received Hep A and B vaccines in 2010  . HTN (hypertension)   . RLS (restless legs syndrome)   . Sleep apnea   . Hyperlipidemia   . IDA (iron deficiency anemia)   . GERD (gastroesophageal reflux disease)   . Depression   . Peripheral neuropathy   .  Urothelial cancer     2010, paillary low-grade, h/o recurrence 2011  . B12 deficiency   . Psoriasis   . 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 Anemia due to multiple mechanisms 05/13/2011  . History of alcohol abuse 05/13/2011  . ANEMIA-IRON DEFICIENCY 03/09/2009  . S/P endoscopy May 2012    4 columns grade II esophageal varices; due for repeat in Nov 2013   . S/P colonoscopy May 2012    Tubular adenoma  . Low back pain   . Cancer     bladder ca 05/2009 removal and  w/chemo wash  . Esophageal varices with bleeding 11/24/11    s/p emergent EGD 11/25/11 by Dr. Rhea Belton at [facility], Grade III esophageal varices s/p banding X 5  . Small bowel obstruction 02/15/2012    Admitted to [facility], managed by Dr. Leticia Penna, ventral hernia manually reduced  . Ventral hernia 02/15/12  . MRSA (methicillin resistant Staphylococcus aureus)   . Acute post-ligation esophageal ulcer with hemorrhage 04/11/2012  . Sleep apnea   . Clostridium difficile infection   . Cirrhosis of liver without mention of alcohol     has received hep A/hep B vaccines, afp-03/26/11= <1.3, ct abd 06/25/12= cirrhosis and portal venous hypertension with hepatomegaly  . H. pylori infection 12/24/2012   Medications:  Scheduled:    . albuterol  2.5 mg Nebulization Q4H  . antiseptic oral rinse  15 mL Mouth Rinse QID  . [COMPLETED] calcium gluconate  1 g Intravenous Once  . ceFEPime (MAXIPIME) IV  1  g Intravenous Q12H  . chlorhexidine  15 mL Mouth Rinse BID  . [COMPLETED] dextrose  1 ampule Intravenous Once  . ethanolamine      . [COMPLETED] fentaNYL  100 mcg Intravenous Once  . [COMPLETED] fentaNYL  50 mcg Intravenous Once  . [COMPLETED] furosemide  10 mg Intravenous Once  . [COMPLETED] insulin aspart  10 Units Subcutaneous Once  . insulin aspart  2-6 Units Subcutaneous Q4H  . insulin glargine  40 Units Subcutaneous Daily  . [COMPLETED] midazolam  4 mg Intravenous Once  . [COMPLETED] octreotide  50 mcg Intravenous Once  . [COMPLETED]  ondansetron  4 mg Intravenous Once  . [COMPLETED] pantoprazole (PROTONIX) IV  40 mg Intravenous Once  . [COMPLETED] sodium bicarbonate  50 mEq Intravenous Once  . [COMPLETED] sodium chloride  2,000 mL Intravenous Once  . [COMPLETED] sodium chloride  500 mL Intravenous Once  . sodium chloride  10-40 mL Intracatheter Q12H  . vancomycin  1,000 mg Intravenous Q8H  . [DISCONTINUED] albuterol  2.5 mg Nebulization Q4H  . [DISCONTINUED] cefTRIAXone (ROCEPHIN)  IV  1 g Intravenous Q24H  . [DISCONTINUED] sodium polystyrene  30 g Oral Once   Assessment: 57yo male on ventilator in ICU admitted with UTI and varices.  Pt is obese with good renal fxn.  Estimated Creatinine Clearance: 103.4 ml/min (by C-G formula based on Cr of 0.96).  Goal of Therapy:  Vancomycin trough level 15-20 mcg/ml  Plan: Vancomycin 1gm IV q8hrs Check trough at steady state Monitor labs, renal fxn, and cultures per protocol Duration of therapy per MD  Valrie Hart A 12/24/2012,10:16 AM

## 2012-12-24 NOTE — Progress Notes (Signed)
Discussed case at length with MICU attending, Dr. Donnie Aho, at [facility]; he has kindly accepted pt . He will be number #1 on wait list. Air transport planned ASAP. I have discussed with Dr. Sherrie Mustache and wife at length. Wife agreeable. [employer] form completed by me.

## 2012-12-24 NOTE — Consult Note (Signed)
Consult requested by: Dr. Sherrie Mustache Consult requested for respiratory failure:  HPI: This is a 57 year old with known [name]/cirrhosis who came to the hospital with a GI bleed. He has esophageal varices and has had at least 4 episodes of severe GI bleeding. He has been hypotensive. He was intubated for airway protection and remains on the ventilator. He does not have a history of lung disease except for sleep apnea. He is being evaluated for possible TIPS procedure. This morning his potassium level is high he is intubated his blood pressures in the 80s.  Past Medical History  Diagnosis Date  . DM (diabetes mellitus)   . Cirrhosis     bx proven steatohepatitis with cirrhosis (2010); per note from Feb 2011, received Hep A and B vaccines in 2010  . HTN (hypertension)   . RLS (restless legs syndrome)   . Sleep apnea   . Hyperlipidemia   . IDA (iron deficiency anemia)   . GERD (gastroesophageal reflux disease)   . Depression   . Peripheral neuropathy   . Urothelial cancer     2010, paillary low-grade, h/o recurrence 2011  . B12 deficiency   . Psoriasis   . 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 Anemia due to multiple mechanisms 05/13/2011  . History of alcohol abuse 05/13/2011  . ANEMIA-IRON DEFICIENCY 03/09/2009  . S/P endoscopy May 2012    4 columns grade II esophageal varices; due for repeat in Nov 2013   . S/P colonoscopy May 2012    Tubular adenoma  . Low back pain   . Cancer     bladder ca 05/2009 removal and  w/chemo wash  . Esophageal varices with bleeding 11/24/11    s/p emergent EGD 11/25/11 by Dr. Rhea Belton at [facility], Grade III esophageal varices s/p banding X 5  . Small bowel obstruction 02/15/2012    Admitted to [facility], managed by Dr. Leticia Penna, ventral hernia manually reduced  . Ventral hernia 02/15/12  . MRSA (methicillin resistant Staphylococcus aureus)   . Acute post-ligation esophageal ulcer with hemorrhage 04/11/2012  . Sleep apnea   . Clostridium difficile infection   .  Cirrhosis of liver without mention of alcohol     has received hep A/hep B vaccines, afp-03/26/11= <1.3, ct abd 06/25/12= cirrhosis and portal venous hypertension with hepatomegaly     Family History  Problem Relation Age of Onset  . Cirrhosis Father     etoh  . Colon cancer Neg Hx   . Anesthesia problems Neg Hx   . Hypotension Neg Hx   . Malignant hyperthermia Neg Hx   . Pseudochol deficiency Neg Hx   . Kidney cancer Mother   . Cancer Mother   . HIV Brother   . Cirrhosis Brother     nash     History   Social History  . Marital Status: Married    Spouse Name: N/A    Number of Children: 3  . Years of Education: N/A   Occupational History  . disabled    Social History Main Topics  . Smoking status: Current Every Day Smoker -- 1.0 packs/day for 30 years    Types: Cigarettes  . Smokeless tobacco: None  . Alcohol Use: No     Comment: drank heavily for few years in 20s  . Drug Use: No  . Sexually Active: Yes    Birth Control/ Protection: None   Other Topics Concern  . None   Social History Narrative  . None     ROS:  Unobtainable but his wife does say that he does not have any respiratory problems except for sleep apnea. He has not been coughing and congested et Karie Soda.    Objective: Vital signs in last 24 hours: Temp:  [97.2 F (36.2 C)-97.9 F (36.6 C)] 97.2 F (36.2 C) (01/31 0726) Pulse Rate:  [61-90] 62  (01/31 0700) Resp:  [15-25] 16  (01/31 0700) BP: (73-138)/(45-73) 76/51 mmHg (01/31 0645) SpO2:  [94 %-100 %] 100 % (01/31 0700) FiO2 (%):  [100 %] 100 % (01/31 0309) Weight:  [105.688 kg (233 lb)-111.2 kg (245 lb 2.4 oz)] 111.2 kg (245 lb 2.4 oz) (01/31 0315) Weight change:  Last BM Date: 12/24/12  Intake/Output from previous day: 01/30 0701 - 01/31 0700 In: 1965.1 [I.V.:1508.6; Blood:406.5; IV Piggyback:50] Out: 150 [Urine:150]  PHYSICAL EXAM He is intubated sedated and unresponsive. His pupils do react. His mucous membranes are slightly dry. His  neck is supple. His chest shows rhonchi bilaterally the left slightly more than the right. His heart is regular without gallop. His abdomen is soft without masses but is somewhat protuberant. Bowel sounds are present. He has no clubbing or cyanosis. Central nervous system examination has been normal but I can't really evaluate that now  Lab Results: Basic Metabolic Panel:  Basename 12/24/12 0701 12/24/12 0547  NA 137 139  K 7.0* 6.4*  CL 111 113*  CO2 22 22  GLUCOSE 179* 158*  BUN 19 18  CREATININE 0.96 0.94  CALCIUM 6.7* 7.0*  MG -- --  PHOS -- --   Liver Function Tests:  Basename 12/24/12 0547 12/23/12 2350  AST 37 37  ALT 27 34  ALKPHOS 45 69  BILITOT 0.7 0.6  PROT 5.0* 7.1  ALBUMIN 2.2* 3.0*   No results found for this basename: LIPASE:2,AMYLASE:2 in the last 72 hours No results found for this basename: AMMONIA:2 in the last 72 hours CBC:  Basename 12/24/12 0547 12/23/12 2350 12/21/12 0957  WBC 11.2* 6.8 --  NEUTROABS -- 3.9 2.3  HGB 9.3* 11.1* --  HCT 28.0* 33.7* --  MCV 93.3 92.6 --  PLT 94* 89* --   Cardiac Enzymes: No results found for this basename: CKTOTAL:3,CKMB:3,CKMBINDEX:3,TROPONINI:3 in the last 72 hours BNP: No results found for this basename: PROBNP:3 in the last 72 hours D-Dimer: No results found for this basename: DDIMER:2 in the last 72 hours CBG:  Basename 12/24/12 0744 12/24/12 0535  GLUCAP 165* 126*   Hemoglobin A1C: No results found for this basename: HGBA1C in the last 72 hours Fasting Lipid Panel: No results found for this basename: CHOL,HDL,LDLCALC,TRIG,CHOLHDL,LDLDIRECT in the last 72 hours Thyroid Function Tests: No results found for this basename: TSH,T4TOTAL,FREET4,T3FREE,THYROIDAB in the last 72 hours Anemia Panel:  Basename 12/21/12 0957  VITAMINB12 --  FOLATE --  FERRITIN 20*  TIBC --  IRON --  RETICCTPCT --   Coagulation:  Basename 12/23/12 2350  LABPROT 16.5*  INR 1.37   Urine Drug Screen: Drugs of Abuse  No  results found for this basename: labopia, cocainscrnur, labbenz, amphetmu, thcu, labbarb    Alcohol Level: No results found for this basename: ETH:2 in the last 72 hours Urinalysis:  Basename 12/24/12 0532  COLORURINE YELLOW  LABSPEC 1.025  PHURINE 6.0  GLUCOSEU NEGATIVE  HGBUR MODERATE*  BILIRUBINUR SMALL*  KETONESUR NEGATIVE  PROTEINUR 30*  UROBILINOGEN 0.2  NITRITE NEGATIVE  LEUKOCYTESUR NEGATIVE   Misc. Labs:   ABGS:  Basename 12/24/12 0315  PHART 7.209*  PO2ART 135.0*  TCO2 20.1  HCO3 20.2  MICROBIOLOGY: Recent Results (from the past 240 hour(s))  MRSA PCR SCREENING     Status: Normal   Collection Time   12/24/12  2:58 AM      Component Value Range Status Comment   MRSA by PCR NEGATIVE  NEGATIVE Final     Studies/Results: Dg Chest Portable 1 View  12/24/2012  *RADIOLOGY REPORT*  Clinical Data: Intubated  PORTABLE CHEST - 1 VIEW  Comparison: 12/24/2012  Findings: Endotracheal tube tip 3.3 cm proximal to the carina. Prominent cardiomediastinal contours.  Bilateral interstitial and airspace opacities, most pronounced in the left upper lung. Hypoaeration.  No pneumothorax.  No acute osseous finding.  IMPRESSION: Endotracheal tube tip 3.2 cm proximal to the carina.  Bilateral interstitial and airspace opacities; edema or pneumonia.   Original Report Authenticated By: Jearld Lesch, M.D.    Dg Abd Acute W/chest  12/24/2012  *RADIOLOGY REPORT*  Clinical Data: Abdominal pain and vomiting.  Hematemesis.  ACUTE ABDOMEN SERIES (ABDOMEN 2 VIEW & CHEST 1 VIEW)  Comparison: 04/13/2012 and prior radiographs.  06/25/2012 CT  Findings: Upper limits normal heart size identified. There is no evidence of airspace disease, pleural effusion or pneumothorax.  A paucity of bowel gas is noted with only a small amount of gas noted in the colon. Fluid and gas-filled stomach is noted. There is no evidence of pneumoperitoneum. No acute bony abnormalities are identified.,  IMPRESSION:  Nonspecific bowel gas pattern with paucity of bowel gas and fluid and gas filled stomach.  CT may be helpful for further evaluation as clinically indicated.  No evidence of acute cardiopulmonary disease.   Original Report Authenticated By: Harmon Pier, M.D.     Medications:  Scheduled:   . albuterol  2.5 mg Nebulization Q4H  . antiseptic oral rinse  15 mL Mouth Rinse QID  . calcium gluconate  1 g Intravenous Once  . cefTRIAXone (ROCEPHIN)  IV  1 g Intravenous Q24H  . chlorhexidine  15 mL Mouth Rinse BID  . dextrose  1 ampule Intravenous Once  . dextrose      . ethanolamine      . furosemide      . furosemide  10 mg Intravenous Once  . insulin aspart  10 Units Subcutaneous Once  . insulin aspart  2-6 Units Subcutaneous Q4H  . insulin glargine  40 Units Subcutaneous Daily  . sodium bicarbonate  50 mEq Intravenous Once  . calcium gluconate 1 GM IV       Continuous:   . dextrose 5 % and 0.45% NaCl 1,000 mL with sodium bicarbonate 50 mEq infusion    . midazolam (VERSED) infusion 2 mg/hr (12/24/12 0630)  . octreotide (SANDOSTATIN) infusion 50 mcg/hr (12/24/12 0629)  . pantoprozole (PROTONIX) infusion 8 mg/hr (12/24/12 0600)   XBJ:[id number], midazolam, ondansetron (ZOFRAN) IV, ondansetron  Assesment: He has respiratory failure secondary to upper GI bleed. He was intubated for airway protection. He has bleeding esophageal varices. He has acute blood loss anemia. He is not hemodynamically stable. He is hyperkalemic. He is acidemic and is on  Bicarbonate. Principal Problem:  *Upper GI bleed Active Problems:  DIABETES MELLITUS, TYPE II, CONTROLLED, WITH COMPLICATIONS  HEPATIC CIRRHOSIS  Esophageal varices  Acute posthemorrhagic anemia  UTI (urinary tract infection)  Anemia associated with acute blood loss  Hyperkalemia    Plan: Continue with current treatments. He is not a candidate for efforts at extubation at this point    LOS: 1 day   Valiant Dills L 12/24/2012, 7:51 AM

## 2012-12-24 NOTE — ED Notes (Signed)
Patient intubated via dr.sheldon with 7.5blade and taped at [facility]

## 2012-12-24 NOTE — H&P (Signed)
[facility] History and Physical  Kyle [name]:[id number] DOB: 08-21-1956 DOA: 12/23/2012   PCP: Redmond Baseman, MD  Specialists: Dr. Jena Gauss is his gastroenterologist.   Chief Complaint: Vomiting blood  HPI: Kyle [name] is a 57 y.o. male with a past medical history of liver cirrhosis, esophageal varices, who was in his usual state of health about 2 hours prior to his arrival at the emergency department when he started vomiting blood. He got lightheaded and dizzy. He had lower abdominal pain, which was sharp, 6/10 in intensity. While being evaluated in the ED, he had bowel movement, which was bloody. He has noticed black stools in the last few days prior to this hospitalization. During my assessment patient got lightheaded. His blood pressure was found to be in the 70s. Subsequently, he was seen by gastroenterology and they requested that the patient be intubated for airway protection. Due to the emergency medical situation further history could not be obtained.  Home Medications: Prior to Admission medications   Medication Sig Start Date End Date Taking? Authorizing Provider  bismuth-metronidazole-tetracycline (PYLERA) [phone] MG per capsule Take 3 capsules by mouth 4 (four) times daily -  before meals and at bedtime. For 10 days. 12/16/12   Nira Retort, NP  furosemide (LASIX) 20 MG tablet Take 20 mg by mouth daily.     Historical Provider, MD  gabapentin (NEURONTIN) 300 MG capsule Take 600-900 mg by mouth 2 (two) times daily. Takes 2 at dinner time and 3 before bedtime.    Historical Provider, MD  hyoscyamine (LEVSIN SL) 0.125 MG SL tablet Place 0.125 mg under the tongue every 4 (four) hours as needed. Cramping. 07/08/12   Nira Retort, NP  insulin aspart (NOVOLOG) 100 UNIT/ML injection Inject 10-40 Units into the skin 3 (three) times daily before meals. Sliding Scale    Historical Provider, MD  insulin glargine (LANTUS) 100 UNIT/ML injection Inject 60 Units into the skin 2  (two) times daily.     Historical Provider, MD  lisinopril (PRINIVIL,ZESTRIL) 10 MG tablet Take 10 mg by mouth daily.    Historical Provider, MD  metFORMIN (GLUCOPHAGE) 1000 MG tablet Take 1,000 mg by mouth 2 (two) times daily with a meal.    Historical Provider, MD  Multiple Vitamin (MULTIVITAMIN) capsule Take 1 capsule by mouth daily.      Historical Provider, MD  Omega-3 Fatty Acids (FISH OIL) 1200 MG CAPS Take 1 capsule by mouth 3 (three) times daily.    Historical Provider, MD  omeprazole (PRILOSEC) 40 MG capsule Take 1 capsule (40 mg total) by mouth daily. 12/17/12   Joselyn Arrow, NP  peg 3350 powder (MOVIPREP) 100 G SOLR Take 1 kit (100 g total) by mouth as directed. 09/03/12   Corbin Ade, MD  pramipexole (MIRAPEX) 0.5 MG tablet Take 0.5 mg by mouth at bedtime.     Historical Provider, MD  propranolol (INDERAL) 80 MG tablet Take 1 tablet (80 mg total) by mouth 2 (two) times daily. 10/25/12   Tiffany Kocher, PA  simvastatin (ZOCOR) 20 MG tablet Take 20 mg by mouth every evening.    Historical Provider, MD  traZODone (DESYREL) 50 MG tablet Take 100 mg by mouth at bedtime.     Historical Provider, MD    Allergies:  Allergies  Allergen Reactions  . Aspirin   . Ibuprofen Other (See Comments)    Can't take per Dr Jena Gauss- harms livers  . Tylenol (Acetaminophen) Other (See Comments)  Causes legs to "run"    Past Medical History: Past Medical History  Diagnosis Date  . DM (diabetes mellitus)   . Cirrhosis     bx proven steatohepatitis with cirrhosis (2010); per note from Feb 2011, received Hep A and B vaccines in 2010  . HTN (hypertension)   . RLS (restless legs syndrome)   . Sleep apnea   . Hyperlipidemia   . IDA (iron deficiency anemia)   . GERD (gastroesophageal reflux disease)   . Depression   . Peripheral neuropathy   . Urothelial cancer     2010, paillary low-grade, h/o recurrence 2011  . B12 deficiency   . Psoriasis   . Thrombocytopenia due to hypersplenism  05/13/2011  . Anemia due to multiple mechanisms 05/13/2011  . History of alcohol abuse 05/13/2011  . ANEMIA-IRON DEFICIENCY 03/09/2009  . S/P endoscopy May 2012    4 columns grade II esophageal varices; due for repeat in Nov 2013   . S/P colonoscopy May 2012    Tubular adenoma  . Low back pain   . Cancer     bladder ca 05/2009 removal and  w/chemo wash  . Esophageal varices with bleeding 11/24/11    s/p emergent EGD 11/25/11 by Dr. Rhea Belton at [facility], Grade III esophageal varices s/p banding X 5  . Small bowel obstruction 02/15/2012    Admitted to [facility], managed by Dr. Leticia Penna, ventral hernia manually reduced  . Ventral hernia 02/15/12  . MRSA (methicillin resistant Staphylococcus aureus)   . Acute post-ligation esophageal ulcer with hemorrhage 04/11/2012  . Sleep apnea   . Clostridium difficile infection   . Cirrhosis of liver without mention of alcohol     has received hep A/hep B vaccines, afp-03/26/11= <1.3, ct abd 06/25/12= cirrhosis and portal venous hypertension with hepatomegaly    Past Surgical History  Procedure Date  . Carpel tunnel   . Shoulder surgery   . Adrenal mass surgery 05/2009    benign, left  . Bladder surgery 01/2009 and 06/2010    cancer 2010, small recurrence in 06/2010  . Colonoscopy 04/2009    moderate int hemorrhoids, rare sigmoid diverticula, one mm sessile hyperplastic rectal polyp  . Esophagogastroduodenoscopy 03/2009    small hh  . Small bowel capsule endoscopy 03/2009    couple of small benign appearing erosions, nonbleeding  . Egd/tcs 08/2007    small hiatal hernia, pancolonic diverticula, friable anal canal, 3cm salmon colored epithelium in distal esophagus, bx negative for Barrett's  . Skin cancer excision Oct 2012    left arm  . Esophagogastroduodenoscopy 11/25/2011    Dr. Vedia Coffer III varices in the mid and distal esophagus, banding placed, portal astropathy  . Esophagogastroduodenoscopy 01/14/2012    Dr Rourk->4-5 columns Gr2 varices, 6 bands placed, HH,  distal esophageal ulcer, portal gastropathy, antral erosions  . Esophagogastroduodenoscopy 03/17/2012    Dr. Rito Ehrlich varices status post band ligation. Hiatal hernia. Portal gastropathy.  . Umbilical hernia repair 04/05/2012    Procedure: HERNIA REPAIR UMBILICAL ADULT;  Surgeon: Fabio Bering, MD;  Location: [facility];  Service: General;  Laterality: N/A;  . Esophagogastroduodenoscopy 04/11/2012    Dr. Lysle Pearl ulcer possibly at previous banding sites with stigmata     of bleeding.  Attempt at hemostasis with hemoclip and banding was not successful resulting in recurrence of bleed/  Portal gastropathy, but no evidence of gastric varices/ Recurrent esophageal varices grade 2.  . Esophagogastroduodenoscopy 05/24/2012    Dr. Rito Ehrlich varices, portal gastropathy  . Esophagogastroduodenoscopy 04/11/12  Dr. Lorella Nimrod in the distal esophagus adherent lot and visible vessel treated with bicap. grade I varices in the distal esophagus, one ligating band placed, portal hypertensive gastropathy in the body of the stomach.  . Colonoscopy 09/29/2012    Procedure: COLONOSCOPY;  Surgeon: Corbin Ade, MD;  Location: [facility];  Service: Endoscopy;  Laterality: N/A;  8:30 am    Social History:  reports that he has been smoking Cigarettes.  He has a 30 pack-year smoking history. He does not have any smokeless tobacco history on file. He reports that he does not drink alcohol or use illicit drugs.  Living Situation: Lives with his wife Activity Level: Usually independent with his daily activities   Family History:  Family History  Problem Relation Age of Onset  . Cirrhosis Father     etoh  . Colon cancer Neg Hx   . Anesthesia problems Neg Hx   . Hypotension Neg Hx   . Malignant hyperthermia Neg Hx   . Pseudochol deficiency Neg Hx   . Kidney cancer Mother   . Cancer Mother   . HIV Brother   . Cirrhosis Brother     nash     Review of Systems -  unobtainable from patient  due to intubation  Physical Examination  Filed Vitals:   12/24/12 0015 12/24/12 0141 12/24/12 0159 12/24/12 0200  BP: 90/53 96/67 96/67  138/73  Pulse: 80 90 81 80  Temp:      TempSrc:      Resp: 20 22 20 25   Height:      Weight:      SpO2: 95%  98% 99%    General appearance: alert, cooperative, appears stated age, no distress and moderately obese Head: Normocephalic, without obvious abnormality, atraumatic Eyes: conjunctivae/corneas clear. PERRL, EOM's intact. Throat: lips, mucosa, and tongue normal; teeth and gums normal Neck: no adenopathy, no carotid bruit, no JVD, supple, symmetrical, trachea midline and thyroid not enlarged, symmetric, no tenderness/mass/nodules Resp: clear to auscultation bilaterally Cardio: regular rate and rhythm, S1, S2 normal, no murmur, click, rub or gallop GI: soft, mildly tender in epigastrium; bowel sounds normal; no masses,  no organomegaly Extremities: extremities normal, atraumatic, no cyanosis or edema Pulses: 2+ and symmetric Skin: Skin color, texture, turgor normal. No rashes or lesions Lymph nodes: Cervical, supraclavicular, and axillary nodes normal. Neurologic: Alert and oriented x 3. No focal deficits.  Laboratory Data: Results for orders placed during the hospital encounter of 12/23/12 (from the past 48 hour(s))  CBC WITH DIFFERENTIAL     Status: Abnormal   Collection Time   12/23/12 11:50 PM      Component Value Range Comment   WBC 6.8  4.0 - 10.5 K/uL    RBC 3.64 (*) 4.22 - 5.81 MIL/uL    Hemoglobin 11.1 (*) 13.0 - 17.0 g/dL    HCT 16.1 (*) 09.6 - 52.0 %    MCV 92.6  78.0 - 100.0 fL    MCH 30.5  26.0 - 34.0 pg    MCHC 32.9  30.0 - 36.0 g/dL    RDW 04.5  40.9 - 81.1 %    Platelets 89 (*) 150 - 400 K/uL    Neutrophils Relative 58  43 - 77 %    Neutro Abs 3.9  1.7 - 7.7 K/uL    Lymphocytes Relative 31  12 - 46 %    Lymphs Abs 2.1  0.7 - 4.0 K/uL    Monocytes Relative 9  3 - 12 %  Monocytes Absolute 0.6  0.1 - 1.0 K/uL     Eosinophils Relative 2  0 - 5 %    Eosinophils Absolute 0.2  0.0 - 0.7 K/uL    Basophils Relative 0  0 - 1 %    Basophils Absolute 0.0  0.0 - 0.1 K/uL   COMPREHENSIVE METABOLIC PANEL     Status: Abnormal   Collection Time   12/23/12 11:50 PM      Component Value Range Comment   Sodium 135  135 - 145 mEq/L    Potassium 4.4  3.5 - 5.1 mEq/L    Chloride 100  96 - 112 mEq/L    CO2 26  19 - 32 mEq/L    Glucose, Bld 256 (*) 70 - 99 mg/dL    BUN 20  6 - 23 mg/dL    Creatinine, Ser 1.61  0.50 - 1.35 mg/dL    Calcium 9.3  8.4 - 09.6 mg/dL    Total Protein 7.1  6.0 - 8.3 g/dL    Albumin 3.0 (*) 3.5 - 5.2 g/dL    AST 37  0 - 37 U/L    ALT 34  0 - 53 U/L    Alkaline Phosphatase 69  39 - 117 U/L    Total Bilirubin 0.6  0.3 - 1.2 mg/dL    GFR calc non Af Amer 78 (*) >90 mL/min    GFR calc Af Amer >90  >90 mL/min   TYPE AND SCREEN     Status: Normal (Preliminary result)   Collection Time   12/23/12 11:50 PM      Component Value Range Comment   ABO/RH(D) O POS      Antibody Screen NEG      Sample Expiration 12/26/2012      Unit Number [id number]      Blood Component Type RED CELLS,LR      Unit division 00      Status of Unit ALLOCATED      Transfusion Status OK TO TRANSFUSE      Crossmatch Result Compatible      Unit Number [id number]      Blood Component Type RED CELLS,LR      Unit division 00      Status of Unit ALLOCATED      Transfusion Status OK TO TRANSFUSE      Crossmatch Result Compatible     PROTIME-INR     Status: Abnormal   Collection Time   12/23/12 11:50 PM      Component Value Range Comment   Prothrombin Time 16.5 (*) 11.6 - 15.2 seconds    INR 1.37  0.00 - 1.49   APTT     Status: Normal   Collection Time   12/23/12 11:50 PM      Component Value Range Comment   aPTT 29  24 - 37 seconds   PREPARE RBC (CROSSMATCH)     Status: Normal   Collection Time   12/24/12 12:41 AM      Component Value Range Comment   Order Confirmation ORDER PROCESSED BY BLOOD BANK        Radiology Reports: Dg Abd Acute W/chest  12/24/2012  *RADIOLOGY REPORT*  Clinical Data: Abdominal pain and vomiting.  Hematemesis.  ACUTE ABDOMEN SERIES (ABDOMEN 2 VIEW & CHEST 1 VIEW)  Comparison: 04/13/2012 and prior radiographs.  06/25/2012 CT  Findings: Upper limits normal heart size identified. There is no evidence of airspace disease, pleural effusion or pneumothorax.  A paucity of bowel  gas is noted with only a small amount of gas noted in the colon. Fluid and gas-filled stomach is noted. There is no evidence of pneumoperitoneum. No acute bony abnormalities are identified.,  IMPRESSION: Nonspecific bowel gas pattern with paucity of bowel gas and fluid and gas filled stomach.  CT may be helpful for further evaluation as clinically indicated.  No evidence of acute cardiopulmonary disease.   Original Report Authenticated By: Harmon Pier, M.D.     Problem List  Principal Problem:  *Upper GI bleed Active Problems:  DIABETES MELLITUS, TYPE II, CONTROLLED, WITH COMPLICATIONS  HEPATIC CIRRHOSIS  Esophageal varices  Acute posthemorrhagic anemia   Assessment: This is a 57 year old, Caucasian male, with a past medical history of hepatic cirrhosis with esophageal varices, requiring banding in the past. He has been evaluated at [facility] and is scheduled to undergo a TIPS procedure in the near future. Comes in with the upper GI bleeding. This is most likely variceal bleed.  Plan: #1 upper GI bleeding secondary to esophageal varices: Octreotide infusion will be continued. Gastroenterology has been consulted. PPI infusion will also be continued for now. He will require endoscopy at some point.  #2 mechanical ventilation: Patient was intubated for airway protection. ABGs will be obtained. Chest x-ray will be obtained. Dr. Juanetta Gosling will be consulted for airway management.  #3 Anemia due to acute blood loss: He'll be transfused aggressively. CBC's will be monitored closely.  #4 hypotension: This  is due to hypovolemia: We will give him IV fluids. Blood transfusions will be provided. May require pressors. He'll be monitored in the intensive care unit. Arterial line will be placed.  #4 diabetes, type II: He'll be given less than usual dosing of Lantus. ICU hyperglycemia protocol will be utilized.  DVT Prophylaxis: SCDs Code Status: He is a full code Family Communication: Wife is at the bedside  Disposition Plan: Unclear for now   Further management decisions will depend on results of further testing and patient's response to treatment.  Critical care time: 45 mins  [facility]  [facility] Pager [phone]  If 7PM-7AM, please contact night-coverage [email or web address] Password [email or web address]  12/24/2012, 2:16 AM

## 2012-12-24 NOTE — ED Notes (Signed)
Dr Kendell Bane in with patient for evaluation.

## 2012-12-24 NOTE — Procedures (Signed)
Arterial Catheter Insertion Procedure Note JOHNCHRISTOPHER SARVIS [id number] 1955/11/27  Procedure: Insertion of Arterial Catheter  Indications: Blood pressure monitoring  Procedure Details Consent: Risks of procedure as well as the alternatives and risks of each were explained to the (patient/caregiver).  Consent for procedure obtained. Time Out: Verified patient identification, verified procedure, site/side was marked, verified correct patient position, special equipment/implants available, medications/allergies/relevent history reviewed, required imaging and test results available.  Performed  Maximum sterile technique was used including cap, gown, sheet, sterile gloves Skin prep: Iodine solution; local anesthetic administered 20 gauge catheter was inserted into left radial artery using the Seldinger technique.  Evaluation Blood flow good; BP tracing good. Complications: No apparent complications.  Patient doppled negative on left side, Dr. Sherrie Mustache stated she still wanted A-line inserted.   Jerelyn Scott [facility] 12/24/2012

## 2012-12-24 NOTE — Progress Notes (Signed)
UR Chart Review Completed  

## 2012-12-24 NOTE — Consult Note (Signed)
Referring Provider: Dr. Bernette Mayers Primary Care Physician:  Redmond Baseman, MD Primary Gastroenterologist:  Dr. Jena Gauss  Reason for Consultation:  Recurrent upper GI bleed  HPI: Very pleasant 57 year old gentleman with [name] / cirrhosis and esophageal varices well-known to our practice presented to the ED late yesterday evening with hematemesis and maroon grossly bloody stools some mild generalized abdominal pain.Kyle [name] He has been evaluated by Dr. Bernette Mayers in the emergency department. He is in the process of being resuscitated with IV fluids. He remains hypotensive with a systolic pressure in the 70 range. Initial hemoglobin 11.1 platelet count 89,000 INR 1.37. Patient's has  been started on IV for Protonix and Sandostatin infusions. Dr. Bernette Mayers tells me that we have blood on hold in the blood bank.  Patient's recent GI history pertinent for recurrent and refractory esophageal varices with multiple episodes of variceal hemorrhage treated with banding here and in [place]. He has had multiple banding sessions with persisting grade 2-3 esophageal varices on the last EGD late 2013. I have referred him to [facility] for TIPS procedure. This has been a time-consuming procedure. I believe his first face-to-face contact with [facility] physicians was in August of last year. He has had a couple followups. He reportedly will undergo a TIPS procedure next month.  Other recent GI issues includ history C. difficile infection treated but with persisting diarrhea. Colonoscopy last fall demonstrated rectal varices a small adenoma removed from the cecum.  He was recently found to have H. pylori infection and is in the process of being treated with Pylera at this time.  Past Medical History  Diagnosis Date  . DM (diabetes mellitus)   . Cirrhosis     bx proven steatohepatitis with cirrhosis (2010); per note from Feb 2011, received Hep A and B vaccines in 2010  . HTN (hypertension)   . RLS (restless legs syndrome)   .  Sleep apnea   . Hyperlipidemia   . IDA (iron deficiency anemia)   . GERD (gastroesophageal reflux disease)   . Depression   . Peripheral neuropathy   . Urothelial cancer     2010, paillary low-grade, h/o recurrence 2011  . B12 deficiency   . Psoriasis   . 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 Anemia due to multiple mechanisms 05/13/2011  . History of alcohol abuse 05/13/2011  . ANEMIA-IRON DEFICIENCY 03/09/2009  . S/P endoscopy May 2012    4 columns grade II esophageal varices; due for repeat in Nov 2013   . S/P colonoscopy May 2012    Tubular adenoma  . Low back pain   . Cancer     bladder ca 05/2009 removal and  w/chemo wash  . Esophageal varices with bleeding 11/24/11    s/p emergent EGD 11/25/11 by Dr. Rhea Belton at [facility], Grade III esophageal varices s/p banding X 5  . Small bowel obstruction 02/15/2012    Admitted to [facility], managed by Dr. Leticia Penna, ventral hernia manually reduced  . Ventral hernia 02/15/12  . MRSA (methicillin resistant Staphylococcus aureus)   . Acute post-ligation esophageal ulcer with hemorrhage 04/11/2012  . Sleep apnea   . Clostridium difficile infection   . Cirrhosis of liver without mention of alcohol     has received hep A/hep B vaccines, afp-03/26/11= <1.3, ct abd 06/25/12= cirrhosis and portal venous hypertension with hepatomegaly    Past Surgical History  Procedure Date  . Carpel tunnel   . Shoulder surgery   . Adrenal mass surgery 05/2009    benign, left  .  Bladder surgery 01/2009 and 06/2010    cancer 2010, small recurrence in 06/2010  . Colonoscopy 04/2009    moderate int hemorrhoids, rare sigmoid diverticula, one mm sessile hyperplastic rectal polyp  . Esophagogastroduodenoscopy 03/2009    small hh  . Small bowel capsule endoscopy 03/2009    couple of small benign appearing erosions, nonbleeding  . Egd/tcs 08/2007    small hiatal hernia, pancolonic diverticula, friable anal canal, 3cm salmon colored epithelium in distal esophagus, bx negative  for Barrett's  . Skin cancer excision Oct 2012    left arm  . Esophagogastroduodenoscopy 11/25/2011    Dr. Vedia Coffer III varices in the mid and distal esophagus, banding placed, portal astropathy  . Esophagogastroduodenoscopy 01/14/2012    Dr Brayley Mackowiak->4-5 columns Gr2 varices, 6 bands placed, HH, distal esophageal ulcer, portal gastropathy, antral erosions  . Esophagogastroduodenoscopy 03/17/2012    Dr. Rito Ehrlich varices status post band ligation. Hiatal hernia. Portal gastropathy.  . Umbilical hernia repair 04/05/2012    Procedure: HERNIA REPAIR UMBILICAL ADULT;  Surgeon: Fabio Bering, MD;  Location: [facility] ORS;  Service: General;  Laterality: N/A;  . Esophagogastroduodenoscopy 04/11/2012    Dr. Lysle Pearl ulcer possibly at previous banding sites with stigmata     of bleeding.  Attempt at hemostasis with hemoclip and banding was not successful resulting in recurrence of bleed/  Portal gastropathy, but no evidence of gastric varices/ Recurrent esophageal varices grade 2.  . Esophagogastroduodenoscopy 05/24/2012    Dr. Rito Ehrlich varices, portal gastropathy  . Esophagogastroduodenoscopy 04/11/12    Dr. Lorella Nimrod in the distal esophagus adherent lot and visible vessel treated with bicap. grade I varices in the distal esophagus, one ligating band placed, portal hypertensive gastropathy in the body of the stomach.  . Colonoscopy 09/29/2012    Procedure: COLONOSCOPY;  Surgeon: Corbin Ade, MD;  Location: [facility] SUITE;  Service: Endoscopy;  Laterality: N/A;  8:30 am    Prior to Admission medications   Medication Sig Start Date End Date Taking? Authorizing Provider  bismuth-metronidazole-tetracycline (PYLERA) [phone] MG per capsule Take 3 capsules by mouth 4 (four) times daily -  before meals and at bedtime. For 10 days. 12/16/12   Nira Retort, NP  furosemide (LASIX) 20 MG tablet Take 20 mg by mouth daily.     Historical Provider, MD  gabapentin (NEURONTIN) 300 MG capsule Take  600-900 mg by mouth 2 (two) times daily. Takes 2 at dinner time and 3 before bedtime.    Historical Provider, MD  hyoscyamine (LEVSIN SL) 0.125 MG SL tablet Place 0.125 mg under the tongue every 4 (four) hours as needed. Cramping. 07/08/12   Nira Retort, NP  insulin aspart (NOVOLOG) 100 UNIT/ML injection Inject 10-40 Units into the skin 3 (three) times daily before meals. Sliding Scale    Historical Provider, MD  insulin glargine (LANTUS) 100 UNIT/ML injection Inject 60 Units into the skin 2 (two) times daily.     Historical Provider, MD  lisinopril (PRINIVIL,ZESTRIL) 10 MG tablet Take 10 mg by mouth daily.    Historical Provider, MD  metFORMIN (GLUCOPHAGE) 1000 MG tablet Take 1,000 mg by mouth 2 (two) times daily with a meal.    Historical Provider, MD  Multiple Vitamin (MULTIVITAMIN) capsule Take 1 capsule by mouth daily.      Historical Provider, MD  Omega-3 Fatty Acids (FISH OIL) 1200 MG CAPS Take 1 capsule by mouth 3 (three) times daily.    Historical Provider, MD  omeprazole (PRILOSEC) 40 MG capsule Take 1 capsule (40  mg total) by mouth daily. 12/17/12   Joselyn Arrow, NP  peg 3350 powder (MOVIPREP) 100 G SOLR Take 1 kit (100 g total) by mouth as directed. 09/03/12   Corbin Ade, MD  pramipexole (MIRAPEX) 0.5 MG tablet Take 0.5 mg by mouth at bedtime.     Historical Provider, MD  propranolol (INDERAL) 80 MG tablet Take 1 tablet (80 mg total) by mouth 2 (two) times daily. 10/25/12   Tiffany Kocher, PA  simvastatin (ZOCOR) 20 MG tablet Take 20 mg by mouth every evening.    Historical Provider, MD  traZODone (DESYREL) 50 MG tablet Take 100 mg by mouth at bedtime.     Historical Provider, MD    Current Facility-Administered Medications  Medication Dose Route Frequency Provider Last Rate Last Dose  . octreotide (SANDOSTATIN) 2 mcg/mL in sodium chloride 0.9 % 250 mL infusion  25-50 mcg/hr Intravenous Continuous Charles B. Bernette Mayers, MD 12.5 mL/hr at 12/24/12 0045 25 mcg/hr at 12/24/12 0045  .  pantoprazole (PROTONIX) 80 mg in sodium chloride 0.9 % 250 mL infusion  8 mg/hr Intravenous Continuous Charles B. Bernette Mayers, MD 25 mL/hr at 12/24/12 0045 8 mg/hr at 12/24/12 0045   Current Outpatient Prescriptions  Medication Sig Dispense Refill  . bismuth-metronidazole-tetracycline (PYLERA) 140-125-125 MG per capsule Take 3 capsules by mouth 4 (four) times daily -  before meals and at bedtime. For 10 days.  120 capsule  0  . furosemide (LASIX) 20 MG tablet Take 20 mg by mouth daily.       Kyle [name] gabapentin (NEURONTIN) 300 MG capsule Take 600-900 mg by mouth 2 (two) times daily. Takes 2 at dinner time and 3 before bedtime.      . hyoscyamine (LEVSIN SL) 0.125 MG SL tablet Place 0.125 mg under the tongue every 4 (four) hours as needed. Cramping.      . insulin aspart (NOVOLOG) 100 UNIT/ML injection Inject 10-40 Units into the skin 3 (three) times daily before meals. Sliding Scale      . insulin glargine (LANTUS) 100 UNIT/ML injection Inject 60 Units into the skin 2 (two) times daily.       Kyle [name] lisinopril (PRINIVIL,ZESTRIL) 10 MG tablet Take 10 mg by mouth daily.      . metFORMIN (GLUCOPHAGE) 1000 MG tablet Take 1,000 mg by mouth 2 (two) times daily with a meal.      . Multiple Vitamin (MULTIVITAMIN) capsule Take 1 capsule by mouth daily.        . Omega-3 Fatty Acids (FISH OIL) 1200 MG CAPS Take 1 capsule by mouth 3 (three) times daily.      Kyle [name] omeprazole (PRILOSEC) 40 MG capsule Take 1 capsule (40 mg total) by mouth daily.  30 capsule  11  . peg 3350 powder (MOVIPREP) 100 G SOLR Take 1 kit (100 g total) by mouth as directed.  1 kit  0  . pramipexole (MIRAPEX) 0.5 MG tablet Take 0.5 mg by mouth at bedtime.       . propranolol (INDERAL) 80 MG tablet Take 1 tablet (80 mg total) by mouth 2 (two) times daily.  60 tablet  5  . simvastatin (ZOCOR) 20 MG tablet Take 20 mg by mouth every evening.      . traZODone (DESYREL) 50 MG tablet Take 100 mg by mouth at bedtime.         Allergies as of 12/23/2012 - Review  Complete 12/23/2012  Allergen Reaction Noted  . Aspirin  12/23/2012  . Ibuprofen Other (See  Comments) 05/13/2012  . Tylenol (acetaminophen) Other (See Comments) 07/17/2011    Family History  Problem Relation Age of Onset  . Cirrhosis Father     etoh  . Colon cancer Neg Hx   . Anesthesia problems Neg Hx   . Hypotension Neg Hx   . Malignant hyperthermia Neg Hx   . Pseudochol deficiency Neg Hx   . Kidney cancer Mother   . Cancer Mother   . HIV Brother   . Cirrhosis Brother     nash    History   Social History  . Marital Status: Married    Spouse Name: N/A    Number of Children: 3  . Years of Education: N/A   Occupational History  . disabled    Social History Main Topics  . Smoking status: Current Every Day Smoker -- 1.0 packs/day for 30 years    Types: Cigarettes  . Smokeless tobacco: Not on file  . Alcohol Use: No     Comment: drank heavily for few years in 20s  . Drug Use: No  . Sexually Active: Yes    Birth Control/ Protection: None   Other Topics Concern  . Not on file   Social History Narrative  . No narrative on file    Review of Systems: Gen: Denies any fever, chills, sweats, anorexia, fatigue, weakness, malaise, weight loss, and sleep disorder CV: Denies chest pain, angina, palpitations, syncope, orthopnea, PND, peripheral edema, and claudication. Resp: Denies dyspnea at rest, dyspnea with exercise, cough, sputum, wheezing, coughing up blood, and pleurisy. GI:   Denies dysphagia or odynophagia. Derm: Denies rash, itching, dry skin, hives, moles, warts, or unhealing ulcers.  Psych: Denies depression, anxiety, memory loss, suicidal ideation, hallucinations, paranoia, and confusion. Heme: Denies bruising, bleeding, and enlarged lymph nodes.   Physical Exam: Vital signs in last 24 hours: Temp:  [97.5 F (36.4 C)-97.9 F (36.6 C)] 97.5 F (36.4 C) (01/30 2342) Pulse Rate:  [78-90] 90  (01/31 0141) Resp:  [16-22] 22  (01/31 0141) BP:  (90-117)/(53-71) 96/67 mmHg (01/31 0141) SpO2:  [94 %-95 %] 95 % (01/31 0015) Weight:  [233 lb (105.688 kg)] 233 lb (105.688 kg) (01/30 2342)   General:   Awake alert conversant holding an emesis basin in his ED bed. He is accompanied by his wife.  Head:  Normocephalic and atraumatic. Eyes:  Sclera clear, no icterus.   Conjunctiva pink. Ears:  Normal auditory acuity. Nose:  No deformity, discharge,  or lesions. Mouth:  No deformity or lesions, dentition normal. Neck:  Supple; no masses or thyromegaly. Lungs:  Clear throughout to auscultation.   No wheezes, crackles, or rhonchi. No acute distress. Heart:  Regular rate and rhythm; no murmurs, clicks, rubs,  or gallops. Abdomen:  Significantly obese. Positive bowel sounds the abdomen is soft and nontender    Msk:  Symmetrical without gross deformities. Normal posture. Pulses:  Normal pulses noted. Extremities:  Without clubbing or edema. Neurologic:  Alert and  oriented x4;  grossly normal neurologically.  Lab Results:  [facility] 12/23/12 2350 12/21/12 0957  WBC 6.8 4.1  HGB 11.1* 13.3  HCT 33.7* 40.0  PLT 89* 70*   BMET  Basename 12/23/12 2350  NA 135  K 4.4  CL 100  CO2 26  GLUCOSE 256*  BUN 20  CREATININE 1.05  CALCIUM 9.3   LFT  Basename 12/23/12 2350  PROT 7.1  ALBUMIN 3.0*  AST 37  ALT 34  ALKPHOS 69  BILITOT 0.6  BILIDIR --  IBILI --   PT/INR  Basename 12/23/12 2350  LABPROT 16.5*  INR 1.37    Studies/Results: Dg Abd Acute W/chest  12/24/2012  *RADIOLOGY REPORT*  Clinical Data: Abdominal pain and vomiting.  Hematemesis.  ACUTE ABDOMEN SERIES (ABDOMEN 2 VIEW & CHEST 1 VIEW)  Comparison: 04/13/2012 and prior radiographs.  06/25/2012 CT  Findings: Upper limits normal heart size identified. There is no evidence of airspace disease, pleural effusion or pneumothorax.  A paucity of bowel gas is noted with only a small amount of gas noted in the colon. Fluid and gas-filled stomach is noted. There is no evidence  of pneumoperitoneum. No acute bony abnormalities are identified.,  IMPRESSION: Nonspecific bowel gas pattern with paucity of bowel gas and fluid and gas filled stomach.  CT may be helpful for further evaluation as clinically indicated.  No evidence of acute cardiopulmonary disease.   Original Report Authenticated By: Harmon Pier, M.D.     Impression:  Pleasant 57 year old gentleman with [name]/cirrhosis presenting with recurrent hematemesis and hypotension. He is known esophageall varices.  I suspect he has a recurrent variceal hemorrhage with other etiologies such as peptic ulcer disease being much less likely. Esophageal variceal bleed has been somewhat difficult to treat in this individual previously. He is undergoing evaluation for TIPS. MELD 10. He remains hypotensive.  He has 2 good peripheral IV is in place. IV fluid resuscitation ongoing. IV PPI infusion and Sandostatin ongoing.  Recommendations:  Intubation to secure airway. ICU admission. Continued resuscitation.  Continue PPI and Sandostatin infusion.  He will need prophylactic antibiotics given GI bleeding in a cirrhotic. Urgent EGD, timing of which will be determined by stabilization/resuscitation. I had a lengthy discussion with patient and patient's wife about the gravity of the situation.  He is critically ill and has a significant risk of morbidity and mortality with this clinical scenario. The risks, benefits, limitations, alternatives and imponderables of EGD with bleeding control therapy have been reviewed with the patient and spouse .  The potential for esophageal band ligation and even a sclerosant therapy in the way of ethanolamine have been reviewed. The potential for failure to control bleeding at this institution with the potential need for urgent transfer to a tertiary referral center have also been reviewed. Questions have been answered. Both patient and wife are agreeable to this approach.  I have had a face-to-face conversation  with both Drs. Rito Ehrlich on [place] in the ED regarding patient's management. Dr. Renae Gloss to intubate the patient. He will then be moved to the ICU for further intervention.  I'd like to thank Drs. Renae Gloss and [place] for allowing me to see this nice gentleman once again.

## 2012-12-25 ENCOUNTER — Inpatient Hospital Stay (HOSPITAL_COMMUNITY): Payer: Medicaid Other

## 2012-12-25 DIAGNOSIS — I8501 Esophageal varices with bleeding: Principal | ICD-10-CM

## 2012-12-25 DIAGNOSIS — J690 Pneumonitis due to inhalation of food and vomit: Secondary | ICD-10-CM | POA: Diagnosis not present

## 2012-12-25 DIAGNOSIS — R6510 Systemic inflammatory response syndrome (SIRS) of non-infectious origin without acute organ dysfunction: Secondary | ICD-10-CM | POA: Diagnosis present

## 2012-12-25 HISTORY — PX: TIPS PROCEDURE: SHX808

## 2012-12-25 LAB — CBC
HCT: 24.6 % — ABNORMAL LOW (ref 39.0–52.0)
Hemoglobin: 9 g/dL — ABNORMAL LOW (ref 13.0–17.0)
Hemoglobin: 8.3 g/dL — ABNORMAL LOW (ref 13.0–17.0)
MCH: 30.6 pg (ref 26.0–34.0)
MCHC: 33.2 g/dL (ref 30.0–36.0)
MCHC: 33.6 g/dL (ref 30.0–36.0)
MCV: 93.2 fL (ref 78.0–100.0)
Platelets: 127 10*3/uL — ABNORMAL LOW (ref 150–400)
Platelets: 118 10*3/uL — ABNORMAL LOW (ref 150–400)
Platelets: 116 10*3/uL — ABNORMAL LOW (ref 150–400)
Platelets: 113 10*3/uL — ABNORMAL LOW (ref 150–400)
RBC: 2.94 MIL/uL — ABNORMAL LOW (ref 4.22–5.81)
RBC: 2.89 MIL/uL — ABNORMAL LOW (ref 4.22–5.81)
RBC: 2.66 MIL/uL — ABNORMAL LOW (ref 4.22–5.81)
RBC: 2.64 MIL/uL — ABNORMAL LOW (ref 4.22–5.81)
WBC: 18.9 10*3/uL — ABNORMAL HIGH (ref 4.0–10.5)

## 2012-12-25 LAB — BLOOD GAS, ARTERIAL
Bicarbonate: 21.8 mEq/L (ref 20.0–24.0)
FIO2: 0.6 %
MECHVT: 550 mL
PEEP: 5 cmH2O
PEEP: 5 cmH2O
RATE: 16 resp/min
pCO2 arterial: 43.5 mmHg (ref 35.0–45.0)
pCO2 arterial: 46.4 mmHg — ABNORMAL HIGH (ref 35.0–45.0)
pH, Arterial: 7.329 — ABNORMAL LOW (ref 7.350–7.450)
pH, Arterial: 7.295 — ABNORMAL LOW (ref 7.350–7.450)
pO2, Arterial: 98.8 mmHg (ref 80.0–100.0)

## 2012-12-25 LAB — BASIC METABOLIC PANEL
BUN: 24 mg/dL — ABNORMAL HIGH (ref 6–23)
Calcium: 6.8 mg/dL — ABNORMAL LOW (ref 8.4–10.5)
Chloride: 108 mEq/L (ref 96–112)
Creatinine, Ser: 0.95 mg/dL (ref 0.50–1.35)
GFR calc Af Amer: >90 mL/min (ref >90)
GFR calc non Af Amer: >90 mL/min (ref >90)

## 2012-12-25 LAB — GLUCOSE, CAPILLARY
Glucose-Capillary: 258 mg/dL — ABNORMAL HIGH (ref 70–99)
Glucose-Capillary: 206 mg/dL — ABNORMAL HIGH (ref 70–99)
Glucose-Capillary: 169 mg/dL — ABNORMAL HIGH (ref 70–99)

## 2012-12-25 LAB — VANCOMYCIN, TROUGH: Vancomycin Tr: 17.4 ug/mL (ref 10.0–20.0)

## 2012-12-25 LAB — PRO B NATRIURETIC PEPTIDE: Pro B Natriuretic peptide (BNP): <5 pg/mL (ref 0–125)

## 2012-12-25 LAB — HEPATIC FUNCTION PANEL
Indirect Bilirubin: 0.4 mg/dL (ref 0.3–0.9)
Total Protein: 5.1 g/dL — ABNORMAL LOW (ref 6.0–8.3)

## 2012-12-25 MED ORDER — MIDAZOLAM HCL 50 MG/10ML IJ SOLN
INTRAMUSCULAR | Status: AC
  Filled 2012-12-25: qty 1

## 2012-12-25 MED ORDER — SODIUM CHLORIDE 0.9 % IV SOLN
40.0000 ug/h | INTRAVENOUS | Status: DC
  Administered 2012-12-25: 40 ug/h via INTRAVENOUS
  Filled 2012-12-25 (×2): qty 50

## 2012-12-25 MED ORDER — PHENYLEPHRINE HCL 10 MG/ML IJ SOLN
30.0000 ug/min | INTRAVENOUS | Status: AC
  Administered 2012-12-25: 30 ug/min via INTRAVENOUS
  Administered 2012-12-25: 50 ug/min via INTRAVENOUS
  Filled 2012-12-25 (×4): qty 1

## 2012-12-25 MED ORDER — PHENYLEPHRINE HCL 10 MG/ML IJ SOLN
30.0000 ug/min | INTRAVENOUS | Status: DC
  Administered 2012-12-25: 50 ug/min via INTRAVENOUS
  Filled 2012-12-25 (×3): qty 2

## 2012-12-25 MED ORDER — DEXTROSE-NACL 5-0.9 % IV SOLN
INTRAVENOUS | Status: DC
  Administered 2012-12-25: 10:00:00 via INTRAVENOUS

## 2012-12-25 MED ORDER — LEVOFLOXACIN IN D5W 750 MG/150ML IV SOLN
750.0000 mg | INTRAVENOUS | Status: DC
  Administered 2012-12-25: 750 mg via INTRAVENOUS
  Filled 2012-12-25 (×3): qty 150

## 2012-12-25 NOTE — Progress Notes (Signed)
Subjective: Plans have been made for him to be transferred to [facility] but he has not been moved yet. He remains intubated and on the ventilator. His blood pressure is marginal he is being switched to Neo-Synephrine for blood pressure support which I think is appropriate. He does not seem to have active bleeding now. He is still requiring 60% oxygen to maintain a PO2 in the 60s  Objective: Vital signs in last 24 hours: Temp:  [98.1 F (36.7 C)-99 F (37.2 C)] 98.5 F (36.9 C) (02/01 0725) Pulse Rate:  [52-131] 124  (02/01 0830) Resp:  [16-20] 18  (02/01 0709) BP: (77-131)/(34-91) 92/48 mmHg (02/01 0830) SpO2:  [74 %-100 %] 90 % (02/01 0830) Arterial Line BP: (63-127)/(40-64) 121/51 mmHg (02/01 0830) FiO2 (%):  [60 %] 60 % (02/01 0709) Weight:  [121.3 kg (267 lb 6.7 oz)] 121.3 kg (267 lb 6.7 oz) (02/01 0600) Weight change: 15.612 kg (34 lb 6.7 oz) Last BM Date: 12/24/12  Intake/Output from previous day: 01/31 0701 - 02/01 0700 In: 7498.6 [I.V.:5928.6; Blood:320; IV Piggyback:1250] Out: 1675 [Urine:1675]  PHYSICAL EXAM General appearance: Intubated and sedated Resp: rhonchi bilaterally Cardio: regular rate and rhythm, S1, S2 normal, no murmur, click, rub or gallop GI: His abdomen seems more distended than yesterday and I would assume this is probably from ascites Extremities: He has what appears to be some "third spacing"  Lab Results:    Basic Metabolic Panel:  Basename 12/25/12 0818 12/24/12 1607  NA 136 134*  K 4.5 6.1*  CL 108 107  CO2 23 24  GLUCOSE 294* 282*  BUN 24* 23  CREATININE 0.95 1.05  CALCIUM 6.8* 6.7*  MG -- --  PHOS -- --   Liver Function Tests:  Basename 12/25/12 0431 12/24/12 0547  AST 43* 37  ALT 37 27  ALKPHOS 36* 45  BILITOT 0.6 0.7  PROT 5.1* 5.0*  ALBUMIN 2.1* 2.2*   No results found for this basename: LIPASE:2,AMYLASE:2 in the last 72 hours No results found for this basename: AMMONIA:2 in the last 72 hours CBC:  Basename  12/25/12 0818 12/25/12 0431 12/23/12 2350  WBC 17.0* 16.3* --  NEUTROABS -- -- 3.9  HGB 9.0* 9.0* --  HCT 26.6* 27.1* --  MCV 92.0 92.2 --  PLT 118* 127* --   Cardiac Enzymes: No results found for this basename: CKTOTAL:3,CKMB:3,CKMBINDEX:3,TROPONINI:3 in the last 72 hours BNP: No results found for this basename: PROBNP:3 in the last 72 hours D-Dimer: No results found for this basename: DDIMER:2 in the last 72 hours CBG:  Basename 12/25/12 0712 12/25/12 0356 12/25/12 0001 12/24/12 2016 12/24/12 1555 12/24/12 1120  GLUCAP 258* 262* 239* 257* 255* 259*   Hemoglobin A1C: No results found for this basename: HGBA1C in the last 72 hours Fasting Lipid Panel: No results found for this basename: CHOL,HDL,LDLCALC,TRIG,CHOLHDL,LDLDIRECT in the last 72 hours Thyroid Function Tests: No results found for this basename: TSH,T4TOTAL,FREET4,T3FREE,THYROIDAB in the last 72 hours Anemia Panel: No results found for this basename: VITAMINB12,FOLATE,FERRITIN,TIBC,IRON,RETICCTPCT in the last 72 hours Coagulation:  Basename 12/23/12 2350  LABPROT 16.5*  INR 1.37   Urine Drug Screen: Drugs of Abuse  No results found for this basename: labopia, cocainscrnur, labbenz, amphetmu, thcu, labbarb    Alcohol Level: No results found for this basename: ETH:2 in the last 72 hours Urinalysis:  Basename 12/24/12 0532  COLORURINE YELLOW  LABSPEC 1.025  PHURINE 6.0  GLUCOSEU NEGATIVE  HGBUR MODERATE*  BILIRUBINUR SMALL*  KETONESUR NEGATIVE  PROTEINUR 30*  UROBILINOGEN  0.2  NITRITE NEGATIVE  LEUKOCYTESUR NEGATIVE   Misc. Labs:  ABGS  Basename 12/25/12 0556  PHART 7.329*  PO2ART 67.1*  TCO2 21.1  HCO3 22.2   CULTURES Recent Results (from the past 240 hour(s))  MRSA PCR SCREENING     Status: Normal   Collection Time   12/24/12  2:58 AM      Component Value Range Status Comment   MRSA by PCR NEGATIVE  NEGATIVE Final    Studies/Results: Portable Chest Xray In Am  12/25/2012  *RADIOLOGY  REPORT*  Clinical Data: Intubation  PORTABLE CHEST - 1 VIEW  Comparison:   the previous day's study  Findings: Endotracheal tube tip approximately 5.1 cm above carina. Low lung volumes.  Patchy   perihilar and bibasilar interstitial and airspace opacities, probably not significantly changed allowing for differences in degree of inspiration.  No effusion.  Heart size upper limits normal for technique.  Right arm PICC to the cavoatrial junction.  IMPRESSION:  1.  Low volumes.  Otherwise little change from previous exam.   Original Report Authenticated By: D. Andria Rhein, MD    Dg Chest Port 1 View  12/24/2012  *RADIOLOGY REPORT*  Clinical Data: PICC placement.  GI bleed.  No esophageal varices. Pulmonary infiltrates.  PORTABLE CHEST - 1 VIEW  Comparison: 12/24/2012  Findings: PICC tip appears to be 7.6 cm below the carina in the right atrium.  I recommend it be retracted approximately 3 cm.  Endotracheal tube is in good position.  New atelectasis at the right lung base.  Partial clearing of the patchy infiltrate in the left upper lung zone.  New slight atelectasis in the left mid and upper lung zone.  No effusions. Heart size and vascularity are normal.  IMPRESSION:  1.  PICC tip is in the right atrium and could be retracted approximately 3 cm. 2.  New atelectasis at the right base and left midzone. 3.  Improving faint infiltrate in the left upper lobe.   Original Report Authenticated By: Francene Boyers, M.D.    Dg Chest Portable 1 View  12/24/2012  *RADIOLOGY REPORT*  Clinical Data: Intubated  PORTABLE CHEST - 1 VIEW  Comparison: 12/24/2012  Findings: Endotracheal tube tip 3.3 cm proximal to the carina. Prominent cardiomediastinal contours.  Bilateral interstitial and airspace opacities, most pronounced in the left upper lung. Hypoaeration.  No pneumothorax.  No acute osseous finding.  IMPRESSION: Endotracheal tube tip 3.2 cm proximal to the carina.  Bilateral interstitial and airspace opacities; edema or  pneumonia.   Original Report Authenticated By: Jearld Lesch, M.D.    Dg Abd Acute W/chest  12/24/2012  *RADIOLOGY REPORT*  Clinical Data: Abdominal pain and vomiting.  Hematemesis.  ACUTE ABDOMEN SERIES (ABDOMEN 2 VIEW & CHEST 1 VIEW)  Comparison: 04/13/2012 and prior radiographs.  06/25/2012 CT  Findings: Upper limits normal heart size identified. There is no evidence of airspace disease, pleural effusion or pneumothorax.  A paucity of bowel gas is noted with only a small amount of gas noted in the colon. Fluid and gas-filled stomach is noted. There is no evidence of pneumoperitoneum. No acute bony abnormalities are identified.,  IMPRESSION: Nonspecific bowel gas pattern with paucity of bowel gas and fluid and gas filled stomach.  CT may be helpful for further evaluation as clinically indicated.  No evidence of acute cardiopulmonary disease.   Original Report Authenticated By: Harmon Pier, M.D.     Medications:  Prior to Admission:  Prescriptions prior to admission  Medication Sig Dispense  Refill  . bismuth-metronidazole-tetracycline (PYLERA) 140-125-125 MG per capsule Take 3 capsules by mouth 4 (four) times daily -  before meals and at bedtime. For 10 days.  120 capsule  0  . furosemide (LASIX) 20 MG tablet Take 20 mg by mouth daily.       [name] gabapentin (NEURONTIN) 300 MG capsule Take 300-900 mg by mouth 3 (three) times daily. 1 capsule in the morning, 1 capsule at noon, and 3 capsules at bedtime.      . hyoscyamine (LEVSIN SL) 0.125 MG SL tablet Place 0.125 mg under the tongue every 4 (four) hours as needed. Cramping.      . insulin aspart (NOVOLOG) 100 UNIT/ML injection Inject 28-43 Units into the skin 3 (three) times daily before meals. 161-220: 31 units, 221-280: 34 units, 281-340: 37 units, 341-400: 40 units, over 400: 43 units.      . insulin glargine (LANTUS) 100 UNIT/ML injection Inject 60 Units into the skin 2 (two) times daily.       [name] lisinopril (PRINIVIL,ZESTRIL) 20 MG tablet Take 20  mg by mouth daily.      . metFORMIN (GLUCOPHAGE) 1000 MG tablet Take 1,000 mg by mouth 2 (two) times daily with a meal.      . omeprazole (PRILOSEC) 40 MG capsule Take 1 capsule (40 mg total) by mouth daily.  30 capsule  11  . pramipexole (MIRAPEX) 0.5 MG tablet Take 0.5 mg by mouth at bedtime.       . propranolol (INDERAL) 80 MG tablet Take 1 tablet (80 mg total) by mouth 2 (two) times daily.  60 tablet  5  . simvastatin (ZOCOR) 20 MG tablet Take 20 mg by mouth every evening.      . traZODone (DESYREL) 50 MG tablet Take 100 mg by mouth at bedtime.       . Multiple Vitamin (MULTIVITAMIN) capsule Take 1 capsule by mouth daily.        . Omega-3 Fatty Acids (FISH OIL) 1200 MG CAPS Take 1 capsule by mouth 3 (three) times daily.      . peg 3350 powder (MOVIPREP) 100 G SOLR Take 1 kit (100 g total) by mouth as directed.  1 kit  0   Scheduled:   . albuterol  2.5 mg Nebulization Q4H  . antiseptic oral rinse  15 mL Mouth Rinse QID  . ceFEPime (MAXIPIME) IV  1 g Intravenous Q12H  . chlorhexidine  15 mL Mouth Rinse BID  . insulin aspart  2-6 Units Subcutaneous Q4H  . insulin glargine  40 Units Subcutaneous Daily  . levofloxacin (LEVAQUIN) IV  750 mg Intravenous Q24H  . sodium chloride  10-40 mL Intracatheter Q12H  . vancomycin  1,000 mg Intravenous Q8H   Continuous:   . dextrose 5 % and 0.45% NaCl 1,000 mL with sodium bicarbonate 50 mEq infusion 200 mL/hr at 12/25/12 0600  . midazolam (VERSED) infusion 2 mg/hr (12/24/12 1730)  . midazolam (VERSED) infusion 10 mg/hr (12/25/12 0654)  . octreotide (SANDOSTATIN) infusion 50 mcg/hr (12/25/12 0600)  . pantoprozole (PROTONIX) infusion 8 mg/hr (12/25/12 0600)  . phenylephrine (NEO-SYNEPHRINE) Adult infusion 30 mcg/min (12/25/12 0845)   WUJ:[id number], midazolam, midazolam, ondansetron (ZOFRAN) IV, ondansetron, sodium chloride  Assesment: He has acute respiratory failure. His blood gas looks better as far as his pH and PCO2 are concerned but he is still  on 60% oxygen. He is still hypotensive and is being switched to Neo-Synephrine. He is on Versed for sedation but is requiring supplemental fentanyl  so I'm going to put him on a fentanyl drip Principal Problem:  *Upper GI bleed Active Problems:  DIABETES MELLITUS, TYPE II, CONTROLLED, WITH COMPLICATIONS  HEPATIC CIRRHOSIS  Esophageal varices  Acute posthemorrhagic anemia  UTI (urinary tract infection)  Hyperkalemia  H. pylori infection  Microscopic hematuria  Hemorrhagic shock  Respiratory failure with hypercapnia    Plan: Continue supportive care. Add fentanyl drip    LOS: 2 days   Aubrie Lucien L 12/25/2012, 8:59 AM

## 2012-12-25 NOTE — Progress Notes (Signed)
Subjective: The patient is intubated and sedated, but is moving all of his extremities as if he is agitated. He does not respond to his name. Sedation protocol is being adjusted.  Objective: Vital signs in last 24 hours: Filed Vitals:   12/25/12 0745 12/25/12 0800 12/25/12 0815 12/25/12 0830  BP:  93/62 115/91 92/48  Pulse: 109 102 52 124  Temp:      TempSrc:      Resp:      Height:      Weight:      SpO2: 94% 74% 84% 90%    Intake/Output Summary (Last 24 hours) at 12/25/12 0908 Last data filed at 12/25/12 9604  Gross per 24 hour  Intake 6390.58 ml  Output   1675 ml  Net 4715.58 ml    Weight change: 15.612 kg (34 lb 6.7 oz)  Physical exam: General: Obese 57 year old Caucasian man laying in bed sedated and intubated. HEENT: Head is normocephalic, nontraumatic. Pupils are sluggish, but equal and reactive to light. ET tube noted within the oral cavity. Neck: Supple, no adenopathy, no thyromegaly. Lungs: Bilateral rhonchi. Heart: S1, S2, no murmurs rubs or gallops. Abdomen: Obese, hypoactive bowel sounds, mildly distended, nontender. Extremities: Trace of pedal edema bilaterally.  Neurologic: He is sedated and intubated, but is moving all of his extremities.  Lab Results: Basic Metabolic Panel:  Basename 12/25/12 0818 12/24/12 1607  NA 136 134*  K 4.5 6.1*  CL 108 107  CO2 23 24  GLUCOSE 294* 282*  BUN 24* 23  CREATININE 0.95 1.05  CALCIUM 6.8* 6.7*  MG -- --  PHOS -- --   Liver Function Tests:  Basename 12/25/12 0431 12/24/12 0547  AST 43* 37  ALT 37 27  ALKPHOS 36* 45  BILITOT 0.6 0.7  PROT 5.1* 5.0*  ALBUMIN 2.1* 2.2*   No results found for this basename: LIPASE:2,AMYLASE:2 in the last 72 hours No results found for this basename: AMMONIA:2 in the last 72 hours CBC:  Basename 12/25/12 0818 12/25/12 0431 12/23/12 2350  WBC 17.0* 16.3* --  NEUTROABS -- -- 3.9  HGB 9.0* 9.0* --  HCT 26.6* 27.1* --  MCV 92.0 92.2 --  PLT 118* 127* --   Cardiac  Enzymes: No results found for this basename: CKTOTAL:3,CKMB:3,CKMBINDEX:3,TROPONINI:3 in the last 72 hours BNP: No results found for this basename: PROBNP:3 in the last 72 hours D-Dimer: No results found for this basename: DDIMER:2 in the last 72 hours CBG:  Basename 12/25/12 0712 12/25/12 0356 12/25/12 0001 12/24/12 2016 12/24/12 1555 12/24/12 1120  GLUCAP 258* 262* 239* 257* 255* 259*   Hemoglobin A1C: No results found for this basename: HGBA1C in the last 72 hours Fasting Lipid Panel: No results found for this basename: CHOL,HDL,LDLCALC,TRIG,CHOLHDL,LDLDIRECT in the last 72 hours Thyroid Function Tests: No results found for this basename: TSH,T4TOTAL,FREET4,T3FREE,THYROIDAB in the last 72 hours Anemia Panel: No results found for this basename: VITAMINB12,FOLATE,FERRITIN,TIBC,IRON,RETICCTPCT in the last 72 hours Coagulation:  Basename 12/23/12 2350  LABPROT 16.5*  INR 1.37   Urine Drug Screen: Drugs of Abuse  No results found for this basename: labopia,  cocainscrnur,  labbenz,  amphetmu,  thcu,  labbarb    Alcohol Level: No results found for this basename: ETH:2 in the last 72 hours Urinalysis:  Basename 12/24/12 0532  COLORURINE YELLOW  LABSPEC 1.025  PHURINE 6.0  GLUCOSEU NEGATIVE  HGBUR MODERATE*  BILIRUBINUR SMALL*  KETONESUR NEGATIVE  PROTEINUR 30*  UROBILINOGEN 0.2  NITRITE NEGATIVE  LEUKOCYTESUR NEGATIVE   Misc. Labs:  Micro: Recent Results (from the past 240 hour(s))  MRSA PCR SCREENING     Status: Normal   Collection Time   12/24/12  2:58 AM      Component Value Range Status Comment   MRSA by PCR NEGATIVE  NEGATIVE Final     Studies/Results: Portable Chest Xray In Am  12/25/2012  *RADIOLOGY REPORT*  Clinical Data: Intubation  PORTABLE CHEST - 1 VIEW  Comparison:   the previous day's study  Findings: Endotracheal tube tip approximately 5.1 cm above carina. Low lung volumes.  Patchy   perihilar and bibasilar interstitial and airspace opacities,  probably not significantly changed allowing for differences in degree of inspiration.  No effusion.  Heart size upper limits normal for technique.  Right arm PICC to the cavoatrial junction.  IMPRESSION:  1.  Low volumes.  Otherwise little change from previous exam.   Original Report Authenticated By: D. Andria Rhein, MD    Dg Chest Port 1 View  12/24/2012  *RADIOLOGY REPORT*  Clinical Data: PICC placement.  GI bleed.  No esophageal varices. Pulmonary infiltrates.  PORTABLE CHEST - 1 VIEW  Comparison: 12/24/2012  Findings: PICC tip appears to be 7.6 cm below the carina in the right atrium.  I recommend it be retracted approximately 3 cm.  Endotracheal tube is in good position.  New atelectasis at the right lung base.  Partial clearing of the patchy infiltrate in the left upper lung zone.  New slight atelectasis in the left mid and upper lung zone.  No effusions. Heart size and vascularity are normal.  IMPRESSION:  1.  PICC tip is in the right atrium and could be retracted approximately 3 cm. 2.  New atelectasis at the right base and left midzone. 3.  Improving faint infiltrate in the left upper lobe.   Original Report Authenticated By: Francene Boyers, M.D.    Dg Chest Portable 1 View  12/24/2012  *RADIOLOGY REPORT*  Clinical Data: Intubated  PORTABLE CHEST - 1 VIEW  Comparison: 12/24/2012  Findings: Endotracheal tube tip 3.3 cm proximal to the carina. Prominent cardiomediastinal contours.  Bilateral interstitial and airspace opacities, most pronounced in the left upper lung. Hypoaeration.  No pneumothorax.  No acute osseous finding.  IMPRESSION: Endotracheal tube tip 3.2 cm proximal to the carina.  Bilateral interstitial and airspace opacities; edema or pneumonia.   Original Report Authenticated By: Jearld Lesch, M.D.    Dg Abd Acute W/chest  12/24/2012  *RADIOLOGY REPORT*  Clinical Data: Abdominal pain and vomiting.  Hematemesis.  ACUTE ABDOMEN SERIES (ABDOMEN 2 VIEW & CHEST 1 VIEW)  Comparison:  04/13/2012 and prior radiographs.  06/25/2012 CT  Findings: Upper limits normal heart size identified. There is no evidence of airspace disease, pleural effusion or pneumothorax.  A paucity of bowel gas is noted with only a small amount of gas noted in the colon. Fluid and gas-filled stomach is noted. There is no evidence of pneumoperitoneum. No acute bony abnormalities are identified.,  IMPRESSION: Nonspecific bowel gas pattern with paucity of bowel gas and fluid and gas filled stomach.  CT may be helpful for further evaluation as clinically indicated.  No evidence of acute cardiopulmonary disease.   Original Report Authenticated By: Harmon Pier, M.D.     Medications:  Scheduled:    . albuterol  2.5 mg Nebulization Q4H  . antiseptic oral rinse  15 mL Mouth Rinse QID  . ceFEPime (MAXIPIME) IV  1 g Intravenous Q12H  . chlorhexidine  15 mL Mouth Rinse BID  .  insulin aspart  2-6 Units Subcutaneous Q4H  . insulin glargine  40 Units Subcutaneous Daily  . levofloxacin (LEVAQUIN) IV  750 mg Intravenous Q24H  . sodium chloride  10-40 mL Intracatheter Q12H  . vancomycin  1,000 mg Intravenous Q8H   Continuous:    . dextrose 5 % and 0.45% NaCl 1,000 mL with sodium bicarbonate 50 mEq infusion 200 mL/hr at 12/25/12 0600  . fentaNYL infusion INTRAVENOUS    . midazolam (VERSED) infusion 2 mg/hr (12/24/12 1730)  . midazolam (VERSED) infusion 10 mg/hr (12/25/12 0654)  . octreotide (SANDOSTATIN) infusion 50 mcg/hr (12/25/12 0600)  . pantoprozole (PROTONIX) infusion 8 mg/hr (12/25/12 0600)  . phenylephrine (NEO-SYNEPHRINE) Adult infusion 30 mcg/min (12/25/12 0845)   ZOX:WRUEAVWU, midazolam, midazolam, ondansetron (ZOFRAN) IV, ondansetron, sodium chloride  Assessment: Principal Problem:  *Upper GI bleed Active Problems:  Acute posthemorrhagic anemia  Hemorrhagic shock  Respiratory failure with hypercapnia  DIABETES MELLITUS, TYPE II, CONTROLLED, WITH COMPLICATIONS  Esophageal varices  Aspiration  pneumonia  SIRS (systemic inflammatory response syndrome)  HEPATIC CIRRHOSIS  UTI (urinary tract infection)  Hyperkalemia  H. pylori infection  Microscopic hematuria   1. Upper GI bleeding secondary to esophageal variceal bleeding. Status post hemostasis therapy and banding of 9 varices, per Dr. Jena Gauss on 12/24/2012. We'll continue both of the octreotide drip and Protonix drip as ordered. (See discharge summary dictated yesterday). The plan is to air transport the patient to [facility] for TIPS procedure.  Acute posthemorrhagic anemia. Status post 3 units of packed red blood cell transfusions. His hemoglobin has been stable over the past 12-18 hours. We'll continue to monitor his CBC every 6 hours and transfuse accordingly.  Acute respiratory failure with hypercapnia and respiratory acidosis. His ABG this morning is noted for hypoxia. His followup chest x-ray reveals air space opacities which could be secondary to pneumonia and/or pulmonary edema. The patient was then intubated primarily for airway protection, but is noted to have mixed respiratory and metabolic acidosis. His acidosis is resolving. We'll continue mechanical ventilation. Dr. Juanetta Gosling is providing assistance with ventilator management. He is making changes in the ventilator setting secondary to the patient's low PO2.  Perihilar and bibasilar pulmonary airspace opacities. This could be secondary to possible aspiration pneumonia or pulmonary edema given volume resuscitation. Given his elevated white blood cell count, antibiotic therapy has been broadened to cefepime, vancomycin, and added Levaquin. Of note, his proBNP is less than 5, not indicative of pulmonary edema.  Hypovolemia secondary to hemorrhagic shock. He has been transfused 3 units of packed red blood cells. His blood pressure overall is better, but he is tachycardic on a dopamine. We'll therefore discontinue dopamine in favor of Neo-Synephrine.  Systemic inflammatory  response syndrome. Secondary to all of the above.  Microcytic hematuria.  Hyperkalemia. Status post measures to treat. His serum potassium is now 4.5.  Type 2 diabetes mellitus. He is n.p.o. He is receiving dextrose in his IV fluids. Will adjust sliding scale NovoLog accordingly as his CBGs are increasing.  Plan:  1. Neo-Synephrine drip has been started and dopamine drip discontinued due to mild to moderate tachycardia in the setting of hypotension. 2. Antibiotic therapy has been broadened and Levaquin added. 3. Adjust IV fluids to discontinue the bicarbonate. 4. Increase sliding scale NovoLog. 5. Ventilator adjustments per Dr. Juanetta Gosling. 6. Further recommendations per Dr. Rourk:regarding the patient's GI status. 7. Await transport to [facility].    Total critical care time: 45 minutes.     LOS: 2 days  Tennis Mckinnon 12/25/2012, 9:08 AM

## 2012-12-25 NOTE — Progress Notes (Signed)
A line found , all of line in floor, except for pressure bag. Aline is now positional when drawing blood, Wave form is dampened. All is this was found around 1930, 12/25/2012 . Sats 86 on pulse ox. Pt suctioned , given neb , fio2 increased to 100 gas drawn off line. Oxygen is being titrated down from 100. Now at 70 sat 92. As of note.

## 2012-12-25 NOTE — Progress Notes (Signed)
ANTIBIOTIC CONSULT NOTE   Pharmacy Consult for Vancomycin Indication: UTI, varices, in ICU and on ventilator  Allergies  Allergen Reactions  . Aspirin   . Ibuprofen Other (See Comments)    Can't take per Dr Jena Gauss- harms livers  . Tylenol (Acetaminophen) Other (See Comments)    Causes legs to "run"   Patient Measurements: Height: 5\' 7"  (170.2 cm) Weight: 267 lb 6.7 oz (121.3 kg) IBW/kg (Calculated) : 66.1   Vital Signs: Temp: 98.5 F (36.9 C) (02/01 0725) Temp src: Axillary (02/01 0725) BP: 79/39 mmHg (02/01 1000) Pulse Rate: 91  (02/01 1000) Intake/Output from previous day: 01/31 0701 - 02/01 0700 In: 7498.6 [I.V.:5928.6; Blood:320; IV Piggyback:1250] Out: 1675 [Urine:1675] Intake/Output from this shift:    Labs:  Basename 12/25/12 0818 12/25/12 0431 12/24/12 2111 12/24/12 1607 12/24/12 0701  WBC 17.0* 16.3* 15.4* -- --  HGB 9.0* 9.0* 9.2* -- --  PLT 118* 127* 132* -- --  LABCREA -- -- -- -- --  CREATININE 0.95 -- -- 1.05 0.96   Estimated Creatinine Clearance: 109.6 ml/min (by C-G formula based on Cr of 0.95).  Basename 12/25/12 0818  VANCOTROUGH 17.4  VANCOPEAK --  VANCORANDOM --  GENTTROUGH --  GENTPEAK --  GENTRANDOM --  TOBRATROUGH --  TOBRAPEAK --  TOBRARND --  AMIKACINPEAK --  AMIKACINTROU --  AMIKACIN --    Microbiology: Recent Results (from the past 720 hour(s))  MRSA PCR SCREENING     Status: Normal   Collection Time   12/24/12  2:58 AM      Component Value Range Status Comment   MRSA by PCR NEGATIVE  NEGATIVE Final    Medical History: Past Medical History  Diagnosis Date  . DM (diabetes mellitus)   . Cirrhosis     bx proven steatohepatitis with cirrhosis (2010); per note from Feb 2011, received Hep A and B vaccines in 2010  . HTN (hypertension)   . RLS (restless legs syndrome)   . Sleep apnea   . Hyperlipidemia   . IDA (iron deficiency anemia)   . GERD (gastroesophageal reflux disease)   . Depression   . Peripheral neuropathy   .  Urothelial cancer     2010, paillary low-grade, h/o recurrence 2011  . B12 deficiency   . Psoriasis   . 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 Anemia due to multiple mechanisms 05/13/2011  . History of alcohol abuse 05/13/2011  . ANEMIA-IRON DEFICIENCY 03/09/2009  . S/P endoscopy May 2012    4 columns grade II esophageal varices; due for repeat in Nov 2013   . S/P colonoscopy May 2012    Tubular adenoma  . Low back pain   . Cancer     bladder ca 05/2009 removal and  w/chemo wash  . Esophageal varices with bleeding 11/24/11    s/p emergent EGD 11/25/11 by Dr. Rhea Belton at [facility], Grade III esophageal varices s/p banding X 5  . Small bowel obstruction 02/15/2012    Admitted to [facility], managed by Dr. Leticia Penna, ventral hernia manually reduced  . Ventral hernia 02/15/12  . MRSA (methicillin resistant Staphylococcus aureus)   . Acute post-ligation esophageal ulcer with hemorrhage 04/11/2012  . Sleep apnea   . Clostridium difficile infection   . Cirrhosis of liver without mention of alcohol     has received hep A/hep B vaccines, afp-03/26/11= <1.3, ct abd 06/25/12= cirrhosis and portal venous hypertension with hepatomegaly  . H. pylori infection 12/24/2012   Medications:  Scheduled:     .  albuterol  2.5 mg Nebulization Q4H  . antiseptic oral rinse  15 mL Mouth Rinse QID  . ceFEPime (MAXIPIME) IV  1 g Intravenous Q12H  . chlorhexidine  15 mL Mouth Rinse BID  . [EXPIRED] ethanolamine      . insulin aspart  2-6 Units Subcutaneous Q4H  . insulin glargine  40 Units Subcutaneous Daily  . levofloxacin (LEVAQUIN) IV  750 mg Intravenous Q24H  . [COMPLETED] octreotide  50 mcg Intravenous Once  . sodium chloride  10-40 mL Intracatheter Q12H  . vancomycin  1,000 mg Intravenous Q8H   Assessment: 57yo male on ventilator in ICU admitted with UTI and varices.  Pt is obese with good renal fxn.  Estimated Creatinine Clearance: 109.6 ml/min (by C-G formula based on Cr of 0.95).  Trough level is on  target.  Goal of Therapy:  Vancomycin trough level 15-20 mcg/ml  Plan: Continue Vancomycin 1gm IV q8hrs Re-Check trough when appropriate/weekly  Monitor labs, renal fxn, and cultures per protocol Duration of therapy per MD  Valrie Hart A 12/25/2012,10:14 AM

## 2012-12-25 NOTE — Progress Notes (Signed)
Notified Dr. Rito Ehrlich of CBG of 262 and he gave telephone order of 8 units of insulin to be given Sub Q.

## 2012-12-25 NOTE — Progress Notes (Signed)
Subjective: Remains intubated and sedated.  No further hematemesis. No melena or hematochezia. Hemoglobin stable at 9 after a total of 3 units transfusion yesterday. Dopamine discontinued. A low dose Neo-Synephrine for hypotension.  He continues on IV PPI, Sandostatin and antibiotics. Bicarbonate drip has been stopped. This morning. AST 43, ALT 37 and total bilirubin 0.6   Objective: Vital signs in last 24 hours: Temp:  [98.1 F (36.7 C)-99 F (37.2 C)] 98.5 F (36.9 C) (02/01 0725) Pulse Rate:  [52-131] 124  (02/01 0830) Resp:  [16-20] 18  (02/01 0709) BP: (77-131)/(34-91) 92/48 mmHg (02/01 0830) SpO2:  [74 %-100 %] 90 % (02/01 0830) Arterial Line BP: (63-127)/(40-64) 121/51 mmHg (02/01 0830) FiO2 (%):  [60 %] 60 % (02/01 0709) Weight:  [267 lb 6.7 oz (121.3 kg)] 267 lb 6.7 oz (121.3 kg) (02/01 0600) Last BM Date: 12/24/12 General:   Alert,  Well-developed, well-nourished, pleasant and cooperative in NAD Abdomen:  Distended/obese. Positive bowel sounds. Abdomen is soft. Easily reducible umbilical hernia present.   Extremities:  Without clubbing or edema.    Intake/Output from previous day: 01/31 0701 - 02/01 0700 In: 7498.6 [I.V.:5928.6; Blood:320; IV Piggyback:1250] Out: 1675 [Urine:1675] Intake/Output this shift:    Lab Results:  Basename 12/25/12 0818 12/25/12 0431 12/24/12 2111  WBC 17.0* 16.3* 15.4*  HGB 9.0* 9.0* 9.2*  HCT 26.6* 27.1* 26.9*  PLT 118* 127* 132*   BMET  Basename 12/25/12 0818 12/24/12 1607 12/24/12 0701  NA 136 134* 137  K 4.5 6.1* 7.0*  CL 108 107 111  CO2 23 24 22   GLUCOSE 294* 282* 179*  BUN 24* 23 19  CREATININE 0.95 1.05 0.96  CALCIUM 6.8* 6.7* 6.7*   LFT  Basename 12/25/12 0431  PROT 5.1*  ALBUMIN 2.1*  AST 43*  ALT 37  ALKPHOS 36*  BILITOT 0.6  BILIDIR 0.2  IBILI 0.4   PT/INR  Basename 12/23/12 2350  LABPROT 16.5*  INR 1.37   Hepatitis Panel No results found for this basename: HEPBSAG,HCVAB,HEPAIGM,HEPBIGM in the  last 72 hours C-Diff No results found for this basename: CDIFFTOX:3 in the last 72 hours  Studies/Results: Portable Chest Xray In Am  12/25/2012  *RADIOLOGY REPORT*  Clinical Data: Intubation  PORTABLE CHEST - 1 VIEW  Comparison:   the previous day's study  Findings: Endotracheal tube tip approximately 5.1 cm above carina. Low lung volumes.  Patchy   perihilar and bibasilar interstitial and airspace opacities, probably not significantly changed allowing for differences in degree of inspiration.  No effusion.  Heart size upper limits normal for technique.  Right arm PICC to the cavoatrial junction.  IMPRESSION:  1.  Low volumes.  Otherwise little change from previous exam.   Original Report Authenticated By: D. Andria Rhein, MD    Dg Chest Port 1 View  12/24/2012  *RADIOLOGY REPORT*  Clinical Data: PICC placement.  GI bleed.  No esophageal varices. Pulmonary infiltrates.  PORTABLE CHEST - 1 VIEW  Comparison: 12/24/2012  Findings: PICC tip appears to be 7.6 cm below the carina in the right atrium.  I recommend it be retracted approximately 3 cm.  Endotracheal tube is in good position.  New atelectasis at the right lung base.  Partial clearing of the patchy infiltrate in the left upper lung zone.  New slight atelectasis in the left mid and upper lung zone.  No effusions. Heart size and vascularity are normal.  IMPRESSION:  1.  PICC tip is in the right atrium and could be retracted approximately 3  cm. 2.  New atelectasis at the right base and left midzone. 3.  Improving faint infiltrate in the left upper lobe.   Original Report Authenticated By: Francene Boyers, M.D.    Dg Chest Portable 1 View  12/24/2012  *RADIOLOGY REPORT*  Clinical Data: Intubated  PORTABLE CHEST - 1 VIEW  Comparison: 12/24/2012  Findings: Endotracheal tube tip 3.3 cm proximal to the carina. Prominent cardiomediastinal contours.  Bilateral interstitial and airspace opacities, most pronounced in the left upper lung. Hypoaeration.  No  pneumothorax.  No acute osseous finding.  IMPRESSION: Endotracheal tube tip 3.2 cm proximal to the carina.  Bilateral interstitial and airspace opacities; edema or pneumonia.   Original Report Authenticated By: Jearld Lesch, M.D.    Dg Abd Acute W/chest  12/24/2012  *RADIOLOGY REPORT*  Clinical Data: Abdominal pain and vomiting.  Hematemesis.  ACUTE ABDOMEN SERIES (ABDOMEN 2 VIEW & CHEST 1 VIEW)  Comparison: 04/13/2012 and prior radiographs.  06/25/2012 CT  Findings: Upper limits normal heart size identified. There is no evidence of airspace disease, pleural effusion or pneumothorax.  A paucity of bowel gas is noted with only a small amount of gas noted in the colon. Fluid and gas-filled stomach is noted. There is no evidence of pneumoperitoneum. No acute bony abnormalities are identified.,  IMPRESSION: Nonspecific bowel gas pattern with paucity of bowel gas and fluid and gas filled stomach.  CT may be helpful for further evaluation as clinically indicated.  No evidence of acute cardiopulmonary disease.   Original Report Authenticated By: Harmon Pier, M.D.    Impression: Critically ill patient with cirrhosis and recent variceal bleed. Bleeding appears to have ceased after endoscopic therapy yesterday. He is overall stabilized/improved from yesterday.  Recommendations: Continue current support.  Await air lift transport to [facility]

## 2012-12-26 ENCOUNTER — Ambulatory Visit (HOSPITAL_COMMUNITY): Admission: RE | Admit: 2012-12-26 | Payer: Medicaid Other | Source: Ambulatory Visit

## 2012-12-26 MED ORDER — MIDAZOLAM HCL 50 MG/10ML IJ SOLN
INTRAMUSCULAR | Status: AC
  Filled 2012-12-26: qty 1

## 2012-12-26 NOTE — Progress Notes (Signed)
Received room assignment from [facility] transfer. Air nor ground transport are available.

## 2012-12-26 NOTE — Progress Notes (Signed)
Wife, Tammy at bedside. Reviewed risks and benefits of transfer and she signed cobra

## 2012-12-26 NOTE — Progress Notes (Signed)
Pt left via stretcher with [facility]

## 2012-12-26 NOTE — Progress Notes (Signed)
Report given to [facility] at [facility]

## 2012-12-26 NOTE — Progress Notes (Addendum)
Spoke with both Dr. Jena Gauss and Dr. Rito Ehrlich concerning travel to [facility]. Both agreed to [facility].

## 2012-12-27 LAB — TYPE AND SCREEN
ABO/RH(D): O POS
Antibody Screen: NEGATIVE
Unit division: 0
Unit division: 0
Unit division: 0

## 2012-12-28 ENCOUNTER — Telehealth: Payer: Self-pay

## 2012-12-28 NOTE — Telephone Encounter (Signed)
REVIEWED.  

## 2012-12-28 NOTE — Telephone Encounter (Addendum)
pts twin brother- Fayrene Fearing called- he and the pts wife are concerned about pt being at [facility]. They stated pt isn't getting his TIPS procedure done, they are only keeping him sedated. The family cannot financially keep going to [facility] to see him and this is causing a lot of emotional stress on the family. They want to know why the patient cannot be transferred to [facility] and have his TIPS done there. They are aware that RMR is off today and I may not have an answer for them until tomorrow. I could hear the pts wife crying in the background.  Fayrene Fearing phone number is [phone] wifes number is [phone] (home) or [phone] (cell)

## 2012-12-29 ENCOUNTER — Telehealth: Payer: Self-pay | Admitting: Internal Medicine

## 2012-12-29 NOTE — Telephone Encounter (Signed)
The brother of patient Kyle [name]) and patient's wife have been calling the office to speak about patient being in [facility] and they are not happy with the care there. They are upset because patient is in [facility] and saying they are doing nothing for him and why was he sent there and not to [facility] which would've been closer for them. JL spoke with brother in length yesterday and brother has called back again this morning upset. He said this was "unexpectable" and he and patient's wife were not going to wait any longer for a phone call. He is demanding to speak to someone face to face in our office "today".  I told brother that RMR was doing procedures this morning and as soon as he responded back to JL that JL would call him, but he said "No" that he would be in the area today and they would be here at 1230 to speak with someone. PLEASE advise

## 2012-12-29 NOTE — Telephone Encounter (Signed)
Called [facility]- spoke with Kathlene November in medical ICU- he stated the "head of Mr. Faircloth team" is Natale Lay and his pager number is [phone].

## 2012-12-29 NOTE — Telephone Encounter (Signed)
I spoke to Dr. Acquanetta Belling at [facility] about Mr. Yankee.  He has ARDS and aspiration pneumonia. He remains on the ventilator. No further bleeding. He is slowly improving. They are optimistic. He will need to have an MRI of his liver prior to TIPS. They plan to TIPS him prior to hospital discharge.  I called Mrs. Burdi and reviewed everything to date with her in some detail. I explained to her how he remains critically ill and he is in the best place. It would be inappropriate, in my mind, to attempt a transfer at this time back to [facility]. I further stated that he will likely be in the hospital [facility] for another week easily. They should be patient with the [facility] team as he is in good hands down there. Her questions were answered in detail.

## 2012-12-29 NOTE — Telephone Encounter (Signed)
Late entry- spoke with pts brother earlier. Explained to him what the reasons for sending pt to [facility]. He stated- pts wife is the pts healthcare POA and they want pt moved to [facility] or [facility] because they are doing anything for the pt except keeping him sedated. Tried to explain to them that if pt was still in critical condition they wouldn't be able to do any procedures on pt until he is stable. He said he understood that but they wont tell them anything and they can afford to go down there to "fight" for him. They are still coming by the office at 12:30 to see if we know anything else by then. Spoke with Dr. Jena Gauss- he has asked me to get the number for the doctor that is taking care of the pt at [facility] and he will call there and speak with them to see what is going on with the pt and then he will call the family later today and let them know what the plan is.

## 2012-12-29 NOTE — Telephone Encounter (Signed)
The patient told me in 2013 that he was not going back to [facility] because they did "nothing for him" when he was there previously. By report, he said he signed out AMA the last time he was there for that reason. This is well chronicled in our office note from May 22.  Therefore, further evaluation was planned at the nearest tertiary referral center which could perform TIPS, liver transplantation, etc. This turned out to be [facility]. So, he was not sent to [facility] because  he did not want to go there.

## 2012-12-29 NOTE — Telephone Encounter (Signed)
The family came by the office and I explained to them that RMR would be calling [facility] and then he would call them. They said it was ok to call anytime, it doesn't matter what time it is. Please call pts wife first. If you cant get her, you can call the brothers number.   Fayrene Fearing- brother- phone number is [phone]  wifes number is [phone] (home) or [phone] (cell)

## 2012-12-29 NOTE — Telephone Encounter (Signed)
Please see other phone note on this pt from yesterday.

## 2013-01-10 ENCOUNTER — Telehealth: Payer: Self-pay | Admitting: Internal Medicine

## 2013-01-10 NOTE — Telephone Encounter (Signed)
Called wife; she reports pneumonia better; recently extubated. X-rays being done; doing much better;ways to go to discharge, hopefully, TIPS prior to discharge

## 2013-01-17 ENCOUNTER — Telehealth: Payer: Self-pay | Admitting: Internal Medicine

## 2013-01-17 NOTE — Telephone Encounter (Signed)
Kyle [name] the brother of patient called to speak or leave message for RMR. Patient is at [facility] and went in today for his TIPS procedure at 10am and was told it would take only 45 minutes. Pt's family are VERY anxious to hear from the doctor and how the procedure went and are worried since it is 350pm and no one at [facility] will tell them anything. Brother wants RMR to call [facility] and find out what is going on and call the wife of patient (Kyle [name]) to give her peace of mind. [phone]

## 2013-01-19 ENCOUNTER — Telehealth: Payer: Self-pay | Admitting: Urgent Care

## 2013-01-19 NOTE — Telephone Encounter (Addendum)
Note reviewed from [facility] Dr. Arnoldo Lenis. The patient had TIPS placed 01/17/13. He would need an ultrasound in 4 weeks to assess TIPS. I'm unsure whether this is going to be done here or at [facility]. Please ask the patient. He also will need to be weaned off beta blocker. Please arrange appointment to wean off beta blocker. Thanks

## 2013-01-20 NOTE — Telephone Encounter (Signed)
Spoke to pt- he would like to have any  tests,etc done in [place] if possible. He is already scheduled to come in here for an ov on 02/08/13.  Do you want [place] to schedule u/s or wait until ov?

## 2013-01-20 NOTE — Telephone Encounter (Signed)
Let's wait until OV Thanks

## 2013-01-26 ENCOUNTER — Ambulatory Visit (HOSPITAL_COMMUNITY): Payer: Medicaid Other | Admitting: Oncology

## 2013-02-01 ENCOUNTER — Encounter (HOSPITAL_COMMUNITY): Payer: Medicaid Other | Attending: Oncology

## 2013-02-01 DIAGNOSIS — K7460 Unspecified cirrhosis of liver: Secondary | ICD-10-CM | POA: Insufficient documentation

## 2013-02-01 DIAGNOSIS — I8500 Esophageal varices without bleeding: Secondary | ICD-10-CM | POA: Insufficient documentation

## 2013-02-01 DIAGNOSIS — D649 Anemia, unspecified: Secondary | ICD-10-CM | POA: Insufficient documentation

## 2013-02-01 DIAGNOSIS — D509 Iron deficiency anemia, unspecified: Secondary | ICD-10-CM

## 2013-02-01 DIAGNOSIS — D6959 Other secondary thrombocytopenia: Secondary | ICD-10-CM | POA: Insufficient documentation

## 2013-02-01 DIAGNOSIS — G4700 Insomnia, unspecified: Secondary | ICD-10-CM | POA: Insufficient documentation

## 2013-02-01 LAB — CBC WITH DIFFERENTIAL/PLATELET
Basophils Absolute: 0 10*3/uL (ref 0.0–0.1)
Basophils Relative: 0 % (ref 0–1)
Eosinophils Absolute: 0.1 10*3/uL (ref 0.0–0.7)
MCH: 27.4 pg (ref 26.0–34.0)
MCHC: 32.8 g/dL (ref 30.0–36.0)
Monocytes Absolute: 0.4 10*3/uL (ref 0.1–1.0)
Neutro Abs: 3 10*3/uL (ref 1.7–7.7)
Neutrophils Relative %: 64 % (ref 43–77)
RDW: 15.2 % (ref 11.5–15.5)

## 2013-02-01 NOTE — Progress Notes (Signed)
Labs drawn today for cbc/diff,ferr 

## 2013-02-02 ENCOUNTER — Other Ambulatory Visit (HOSPITAL_COMMUNITY): Payer: Self-pay | Admitting: Oncology

## 2013-02-02 DIAGNOSIS — D509 Iron deficiency anemia, unspecified: Secondary | ICD-10-CM

## 2013-02-02 NOTE — Progress Notes (Signed)
Kyle Baseman, MD [address] [place] [address]  ANEMIA-IRON DEFICIENCY - Plan: ferumoxytol (FERAHEME) 1,020 mg in sodium chloride 0.9 % 100 mL IVPB, 0.9 %  sodium chloride infusion, sodium chloride 0.9 % injection 10 mL  Insomnia - Plan: zolpidem (AMBIEN) 5 MG tablet  CURRENT THERAPY: PRN Feraheme IV infusions  INTERVAL HISTORY: Kyle [name] 57 y.o. male returns for  regular  visit for followup of anemia of multiple mechanisms   [name] was admitted to the [facility] on 12/23/12 for severe upper GI bleed secondary to bleeding esophageal varices requiring multiple PRBC transfusions and hemostasis therapy and 9 banding applications by Dr. Jena Gauss.  The patient's hospital course worsened to the acuity of the patient and he was transferred to [facility] for TIPS procedure.  He required ventilator support.  He was therefore discharged from [facility] on 12/24/2012 and transferred to [facility].  He did have a TIPS placed on 01/17/2013.    Duane has an upcoming appointment with GI on 02/08/2013.   I personally reviewed and went over laboratory results with the patient. Hgb is great at 11.7 but Ferritin is down to 31.  As a result, we will give him Feraheme 1020 mg IV today.  We reviewed his hospitalization.  He was certainly critically ill.  He is fortunate to be here today.  Hematologically, he denies any complaints and ROS questioning is negative.   He reports difficulty sleeping.  He admits that he has about 2 hours of sleep last night.  He informs me that he has had Ambien in the past and that was effective for him.  I will give him an Rx for Ambien and hopefully that is short term.  He was encouraged to follow-up with his PCP.    Past Medical History  Diagnosis Date  . DM (diabetes mellitus)   . Cirrhosis     bx proven steatohepatitis with cirrhosis (2010); per note from Feb 2011, received Hep A and B vaccines in 2010  . HTN (hypertension)   . RLS (restless legs  syndrome)   . Sleep apnea   . Hyperlipidemia   . IDA (iron deficiency anemia)   . GERD (gastroesophageal reflux disease)   . Depression   . Peripheral neuropathy   . Urothelial cancer     2010, paillary low-grade, h/o recurrence 2011  . B12 deficiency   . Psoriasis   . 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 Anemia due to multiple mechanisms 05/13/2011  . History of alcohol abuse 05/13/2011  . ANEMIA-IRON DEFICIENCY 03/09/2009  . S/P endoscopy May 2012    4 columns grade II esophageal varices; due for repeat in Nov 2013   . S/P colonoscopy May 2012    Tubular adenoma  . Low back pain   . Cancer     bladder ca 05/2009 removal and  w/chemo wash  . Esophageal varices with bleeding 11/24/11    s/p emergent EGD 11/25/11 by Dr. Rhea Belton at [facility], Grade III esophageal varices s/p banding X 5  . Small bowel obstruction 02/15/2012    Admitted to [facility], managed by Dr. Leticia Penna, ventral hernia manually reduced  . Ventral hernia 02/15/12  . MRSA (methicillin resistant Staphylococcus aureus)   . Acute post-ligation esophageal ulcer with hemorrhage 04/11/2012  . Sleep apnea   . Clostridium difficile infection   . Cirrhosis of liver without mention of alcohol     has received hep A/hep B vaccines, afp-03/26/11= <1.3, ct abd 06/25/12= cirrhosis  and portal venous hypertension with hepatomegaly  . H. pylori infection 12/24/2012    has BLADDER CANCER; DIABETES MELLITUS, TYPE II, CONTROLLED, WITH COMPLICATIONS; ADRENAL MASS; HYPERLIPIDEMIA; ANEMIA-IRON DEFICIENCY; ANEMIA, VITAMIN B12 DEFICIENCY; SMOKER; DEPRESSION; SLEEP APNEA, OBSTRUCTIVE, MODERATE; RESTLESS LEG SYNDROME; HYPERTENSION; GERD; HEPATIC CIRRHOSIS; Other chronic nonalcoholic liver disease; ARTHRITIS; SHOULDER PAIN, LEFT; INSOMNIA; FATIGUE; WEIGHT GAIN; CHEST DISCOMFORT; PROTEINURIA; ABNORMAL ELECTROCARDIOGRAM; Thrombocytopenia due to hypersplenism; Anemia due to multiple mechanisms; History of alcohol abuse; Iron deficiency anemia; Abdominal  pain; External hemorrhoid; Upper GI bleed; Hypotension; Diarrhea; Esophageal varices; Umbilical hernia; History of small bowel obstruction; Acute post-ligation esophageal ulcer with hemorrhage; Acute posthemorrhagic anemia; Acute respiratory failure; UTI (urinary tract infection); Hyperkalemia; H. pylori infection; Microscopic hematuria; Hemorrhagic shock; Respiratory failure with hypercapnia; Aspiration pneumonia; and SIRS (systemic inflammatory response syndrome) on his problem list.     is allergic to aspirin; ibuprofen; and tylenol.  Mr. Griffee had no medications administered during this visit.  Past Surgical History  Procedure Laterality Date  . Carpel tunnel    . Shoulder surgery    . Adrenal mass surgery  05/2009    benign, left  . Bladder surgery  01/2009 and 06/2010    cancer 2010, small recurrence in 06/2010  . Colonoscopy  04/2009    moderate int hemorrhoids, rare sigmoid diverticula, one mm sessile hyperplastic rectal polyp  . Esophagogastroduodenoscopy  03/2009    small hh  . Small bowel capsule endoscopy  03/2009    couple of small benign appearing erosions, nonbleeding  . Egd/tcs  08/2007    small hiatal hernia, pancolonic diverticula, friable anal canal, 3cm salmon colored epithelium in distal esophagus, bx negative for Barrett's  . Skin cancer excision  Oct 2012    left arm  . Esophagogastroduodenoscopy  11/25/2011    Dr. Vedia Coffer III varices in the mid and distal esophagus, banding placed, portal astropathy  . Esophagogastroduodenoscopy  01/14/2012    Dr Rourk->4-5 columns Gr2 varices, 6 bands placed, HH, distal esophageal ulcer, portal gastropathy, antral erosions  . Esophagogastroduodenoscopy  03/17/2012    Dr. Rito Ehrlich varices status post band ligation. Hiatal hernia. Portal gastropathy.  . Umbilical hernia repair  04/05/2012    Procedure: HERNIA REPAIR UMBILICAL ADULT;  Surgeon: Fabio Bering, MD;  Location: [facility];  Service: General;  Laterality: N/A;  .  Esophagogastroduodenoscopy  04/11/2012    Dr. Lysle Pearl ulcer possibly at previous banding sites with stigmata     of bleeding.  Attempt at hemostasis with hemoclip and banding was not successful resulting in recurrence of bleed/  Portal gastropathy, but no evidence of gastric varices/ Recurrent esophageal varices grade 2.  . Esophagogastroduodenoscopy  05/24/2012    Dr. Rito Ehrlich varices, portal gastropathy  . Esophagogastroduodenoscopy  04/11/12    Dr. Lorella Nimrod in the distal esophagus adherent lot and visible vessel treated with bicap. grade I varices in the distal esophagus, one ligating band placed, portal hypertensive gastropathy in the body of the stomach.  . Colonoscopy  09/29/2012    Procedure: COLONOSCOPY;  Surgeon: Corbin Ade, MD;  Location: [facility];  Service: Endoscopy;  Laterality: N/A;  8:30 am  . Esophagogastroduodenoscopy  12/24/2012    Procedure: ESOPHAGOGASTRODUODENOSCOPY (EGD);  Surgeon: Corbin Ade, MD;  Location: [facility];  Service: Endoscopy;  Laterality: N/A;  . Schlerotherapy  12/24/2012    Procedure: Theresia Majors OF VARICES;  Surgeon: Corbin Ade, MD;  Location: [facility];  Service: Endoscopy;  Laterality: N/A;  . Esophageal banding  12/24/2012  Procedure: ESOPHAGEAL BANDING;  Surgeon: Corbin Ade, MD;  Location: [facility];  Service: Endoscopy;  Laterality: N/A;    Denies any headaches, dizziness, double vision, fevers, chills, night sweats, nausea, vomiting, diarrhea, constipation, chest pain, heart palpitations, shortness of breath, blood in stool, black tarry stool, urinary pain, urinary burning, urinary frequency, hematuria.   PHYSICAL EXAMINATION  ECOG PERFORMANCE STATUS: 2 - Symptomatic, <50% confined to bed  Filed Vitals:   02/03/13 1100  BP: 130/70  Pulse: 93  Temp: 97.3 F (36.3 C)  Resp: 18    GENERAL:alert, no distress, well developed, comfortable, cooperative, obese but has lost weight and smiling   SKIN: skin color, texture, turgor are normal, no rashes or significant lesions  HEAD: Normocephalic, No masses, lesions, tenderness or abnormalities  EYES: normal, Conjunctiva are pink and non-injected  EARS: External ears normal  OROPHARYNX:lips, buccal mucosa, and tongue normal and mucous membranes are moist  NECK: supple, trachea midline  LYMPH: no palpable lymphadenopathy  BREAST:not examined  LUNGS: clear to auscultation and percussion, decreased breath sounds HEART: regular rate & rhythm, no murmurs, no gallops, S1 normal and S2 normal  ABDOMEN:obese and normal bowel sounds  BACK: Back symmetric, no curvature.  EXTREMITIES:less then 2 second capillary refill, no skin discoloration  NEURO: alert & oriented x 3 with fluent speech, no focal motor/sensory deficits, gait normal   LABORATORY DATA: CBC    Component Value Date/Time   WBC 4.6 02/01/2013 1001   RBC 4.27 02/01/2013 1001   HGB 11.7* 02/01/2013 1001   HCT 35.7* 02/01/2013 1001   PLT 98* 02/01/2013 1001   MCV 83.6 02/01/2013 1001   MCH 27.4 02/01/2013 1001   MCHC 32.8 02/01/2013 1001   RDW 15.2 02/01/2013 1001   LYMPHSABS 1.1 02/01/2013 1001   MONOABS 0.4 02/01/2013 1001   EOSABS 0.1 02/01/2013 1001   BASOSABS 0.0 02/01/2013 1001      Chemistry      Component Value Date/Time   NA 136 12/25/2012 0818   K 4.5 12/25/2012 0818   CL 108 12/25/2012 0818   CO2 23 12/25/2012 0818   BUN 24* 12/25/2012 0818   CREATININE 0.95 12/25/2012 0818   CREATININE 1.03 11/11/2011 1335      Component Value Date/Time   CALCIUM 6.8* 12/25/2012 0818   ALKPHOS 36* 12/25/2012 0431   AST 43* 12/25/2012 0431   ALT 37 12/25/2012 0431   BILITOT 0.6 12/25/2012 0431     Lab Results  Component Value Date   IRON 23* 12/18/2010   TIBC 427 12/18/2010   FERRITIN 31 02/01/2013    ASSESSMENT:  1. Multifactorial anemia, last Feraheme 1020 IV infusion was on 05/10/2012.  2. Iron deficiency anemia  3. Liver disease with fatty infiltration  4. Recent Hospitalization from  severe upper esophageal bleeding requiring multiple PRBC transfusions, intubation, and transfer to [facility] for TIPS procedure which was performed on 01/17/2013.  He was in ICU at [facility] for over 20 days. 5. Obesity  6. Probably lung disease secondary to longstanding smoking history.  7. DM  8. Esophageal varices  9. RLS, failed Tramadol and Gabapentin. 10. Insomnia, will give Ambien a try.  Hopefully that is short term.   PLAN:  1. I personally reviewed and went over laboratory results with the patient. 2. Chart reviewed 3. Encouraged follow-up as scheduled with GI 4. Labs every 6 weeks: CBC diff, Ferritin 5. Feraheme PRN per lab results. 6. Rx for Ambien 5-10 mg at [facility] for sleep. 7. Smoking cessation  education provided 8. Return in 12 weeks for follow-up  All questions were answered. The patient knows to call the clinic with any problems, questions or concerns. We can certainly see the patient much sooner if necessary.  Patient and plan will be discussed with Dr. Mariel Sleet within the next 24 hours.   KEFALAS,THOMAS

## 2013-02-03 ENCOUNTER — Emergency Department (HOSPITAL_COMMUNITY): Payer: Medicaid Other

## 2013-02-03 ENCOUNTER — Encounter (HOSPITAL_BASED_OUTPATIENT_CLINIC_OR_DEPARTMENT_OTHER): Payer: Medicaid Other | Admitting: Oncology

## 2013-02-03 ENCOUNTER — Encounter (HOSPITAL_BASED_OUTPATIENT_CLINIC_OR_DEPARTMENT_OTHER): Payer: Medicaid Other

## 2013-02-03 ENCOUNTER — Encounter (HOSPITAL_COMMUNITY): Payer: Self-pay

## 2013-02-03 ENCOUNTER — Inpatient Hospital Stay (HOSPITAL_COMMUNITY)
Admission: EM | Admit: 2013-02-03 | Discharge: 2013-02-08 | DRG: 100 | Disposition: A | Discharge status: Home / Self Care | Payer: Medicaid Other | Attending: Internal Medicine | Admitting: Internal Medicine

## 2013-02-03 ENCOUNTER — Other Ambulatory Visit: Payer: Self-pay

## 2013-02-03 ENCOUNTER — Encounter (HOSPITAL_COMMUNITY): Payer: Self-pay | Admitting: Oncology

## 2013-02-03 VITALS — BP 130/70 | HR 93 | Temp 97.3°F | Resp 18 | Wt 202.0 lb

## 2013-02-03 DIAGNOSIS — I8500 Esophageal varices without bleeding: Secondary | ICD-10-CM

## 2013-02-03 DIAGNOSIS — R509 Fever, unspecified: Secondary | ICD-10-CM

## 2013-02-03 DIAGNOSIS — K644 Residual hemorrhoidal skin tags: Secondary | ICD-10-CM

## 2013-02-03 DIAGNOSIS — R809 Proteinuria, unspecified: Secondary | ICD-10-CM

## 2013-02-03 DIAGNOSIS — G609 Hereditary and idiopathic neuropathy, unspecified: Secondary | ICD-10-CM | POA: Diagnosis present

## 2013-02-03 DIAGNOSIS — K7460 Unspecified cirrhosis of liver: Secondary | ICD-10-CM

## 2013-02-03 DIAGNOSIS — E118 Type 2 diabetes mellitus with unspecified complications: Secondary | ICD-10-CM

## 2013-02-03 DIAGNOSIS — K429 Umbilical hernia without obstruction or gangrene: Secondary | ICD-10-CM

## 2013-02-03 DIAGNOSIS — K7689 Other specified diseases of liver: Secondary | ICD-10-CM

## 2013-02-03 DIAGNOSIS — D6959 Other secondary thrombocytopenia: Secondary | ICD-10-CM

## 2013-02-03 DIAGNOSIS — IMO0002 Reserved for concepts with insufficient information to code with codable children: Secondary | ICD-10-CM

## 2013-02-03 DIAGNOSIS — F17200 Nicotine dependence, unspecified, uncomplicated: Secondary | ICD-10-CM

## 2013-02-03 DIAGNOSIS — R739 Hyperglycemia, unspecified: Secondary | ICD-10-CM

## 2013-02-03 DIAGNOSIS — G2581 Restless legs syndrome: Secondary | ICD-10-CM

## 2013-02-03 DIAGNOSIS — Z9889 Other specified postprocedural states: Secondary | ICD-10-CM

## 2013-02-03 DIAGNOSIS — G9340 Encephalopathy, unspecified: Secondary | ICD-10-CM

## 2013-02-03 DIAGNOSIS — D509 Iron deficiency anemia, unspecified: Secondary | ICD-10-CM

## 2013-02-03 DIAGNOSIS — F3289 Other specified depressive episodes: Secondary | ICD-10-CM

## 2013-02-03 DIAGNOSIS — R569 Unspecified convulsions: Principal | ICD-10-CM | POA: Diagnosis present

## 2013-02-03 DIAGNOSIS — E875 Hyperkalemia: Secondary | ICD-10-CM

## 2013-02-03 DIAGNOSIS — E872 Acidosis, unspecified: Secondary | ICD-10-CM

## 2013-02-03 DIAGNOSIS — L408 Other psoriasis: Secondary | ICD-10-CM | POA: Diagnosis present

## 2013-02-03 DIAGNOSIS — R404 Transient alteration of awareness: Secondary | ICD-10-CM | POA: Diagnosis present

## 2013-02-03 DIAGNOSIS — E1165 Type 2 diabetes mellitus with hyperglycemia: Secondary | ICD-10-CM | POA: Diagnosis present

## 2013-02-03 DIAGNOSIS — R9431 Abnormal electrocardiogram [ECG] [EKG]: Secondary | ICD-10-CM

## 2013-02-03 DIAGNOSIS — F1011 Alcohol abuse, in remission: Secondary | ICD-10-CM

## 2013-02-03 DIAGNOSIS — G4700 Insomnia, unspecified: Secondary | ICD-10-CM

## 2013-02-03 DIAGNOSIS — N390 Urinary tract infection, site not specified: Secondary | ICD-10-CM

## 2013-02-03 DIAGNOSIS — R0789 Other chest pain: Secondary | ICD-10-CM

## 2013-02-03 DIAGNOSIS — Z8551 Personal history of malignant neoplasm of bladder: Secondary | ICD-10-CM

## 2013-02-03 DIAGNOSIS — F329 Major depressive disorder, single episode, unspecified: Secondary | ICD-10-CM

## 2013-02-03 DIAGNOSIS — E785 Hyperlipidemia, unspecified: Secondary | ICD-10-CM

## 2013-02-03 DIAGNOSIS — G4730 Sleep apnea, unspecified: Secondary | ICD-10-CM | POA: Diagnosis present

## 2013-02-03 DIAGNOSIS — Z79899 Other long term (current) drug therapy: Secondary | ICD-10-CM

## 2013-02-03 DIAGNOSIS — D62 Acute posthemorrhagic anemia: Secondary | ICD-10-CM

## 2013-02-03 DIAGNOSIS — D649 Anemia, unspecified: Secondary | ICD-10-CM

## 2013-02-03 DIAGNOSIS — K2211 Ulcer of esophagus with bleeding: Secondary | ICD-10-CM

## 2013-02-03 DIAGNOSIS — E876 Hypokalemia: Secondary | ICD-10-CM | POA: Diagnosis present

## 2013-02-03 DIAGNOSIS — R6510 Systemic inflammatory response syndrome (SIRS) of non-infectious origin without acute organ dysfunction: Secondary | ICD-10-CM

## 2013-02-03 DIAGNOSIS — C679 Malignant neoplasm of bladder, unspecified: Secondary | ICD-10-CM

## 2013-02-03 DIAGNOSIS — J9692 Respiratory failure, unspecified with hypercapnia: Secondary | ICD-10-CM

## 2013-02-03 DIAGNOSIS — A048 Other specified bacterial intestinal infections: Secondary | ICD-10-CM

## 2013-02-03 DIAGNOSIS — R5383 Other fatigue: Secondary | ICD-10-CM

## 2013-02-03 DIAGNOSIS — D6489 Other specified anemias: Secondary | ICD-10-CM

## 2013-02-03 DIAGNOSIS — Z794 Long term (current) use of insulin: Secondary | ICD-10-CM

## 2013-02-03 DIAGNOSIS — J9600 Acute respiratory failure, unspecified whether with hypoxia or hypercapnia: Secondary | ICD-10-CM

## 2013-02-03 DIAGNOSIS — D518 Other vitamin B12 deficiency anemias: Secondary | ICD-10-CM

## 2013-02-03 DIAGNOSIS — R5381 Other malaise: Secondary | ICD-10-CM

## 2013-02-03 DIAGNOSIS — E278 Other specified disorders of adrenal gland: Secondary | ICD-10-CM

## 2013-02-03 DIAGNOSIS — J690 Pneumonitis due to inhalation of food and vomit: Secondary | ICD-10-CM

## 2013-02-03 DIAGNOSIS — G4733 Obstructive sleep apnea (adult) (pediatric): Secondary | ICD-10-CM

## 2013-02-03 DIAGNOSIS — I10 Essential (primary) hypertension: Secondary | ICD-10-CM

## 2013-02-03 DIAGNOSIS — M129 Arthropathy, unspecified: Secondary | ICD-10-CM

## 2013-02-03 DIAGNOSIS — K922 Gastrointestinal hemorrhage, unspecified: Secondary | ICD-10-CM

## 2013-02-03 DIAGNOSIS — Z8719 Personal history of other diseases of the digestive system: Secondary | ICD-10-CM

## 2013-02-03 DIAGNOSIS — R197 Diarrhea, unspecified: Secondary | ICD-10-CM

## 2013-02-03 DIAGNOSIS — K219 Gastro-esophageal reflux disease without esophagitis: Secondary | ICD-10-CM

## 2013-02-03 DIAGNOSIS — D500 Iron deficiency anemia secondary to blood loss (chronic): Secondary | ICD-10-CM | POA: Diagnosis present

## 2013-02-03 DIAGNOSIS — R578 Other shock: Secondary | ICD-10-CM

## 2013-02-03 DIAGNOSIS — R3129 Other microscopic hematuria: Secondary | ICD-10-CM

## 2013-02-03 DIAGNOSIS — D61818 Other pancytopenia: Secondary | ICD-10-CM | POA: Diagnosis present

## 2013-02-03 DIAGNOSIS — R635 Abnormal weight gain: Secondary | ICD-10-CM

## 2013-02-03 LAB — BLOOD GAS, ARTERIAL
Drawn by: 22223
MECHVT: 480 mL
PEEP: 5 cmH2O
Patient temperature: 37
TCO2: 21.8 mmol/L (ref 0–100)
pCO2 arterial: 43.4 mmHg (ref 35.0–45.0)
pH, Arterial: 7.354 (ref 7.350–7.450)

## 2013-02-03 LAB — GLUCOSE, CAPILLARY
Glucose-Capillary: 514 mg/dL — ABNORMAL HIGH (ref 70–99)
Glucose-Capillary: 465 mg/dL — ABNORMAL HIGH (ref 70–99)
Glucose-Capillary: 406 mg/dL — ABNORMAL HIGH (ref 70–99)

## 2013-02-03 LAB — POCT I-STAT, CHEM 8
BUN: 16 mg/dL (ref 6–23)
Chloride: 99 mEq/L (ref 96–112)
Glucose, Bld: 574 mg/dL (ref 70–99)
HCT: 39 % (ref 39.0–52.0)
Potassium: 4.2 mEq/L (ref 3.5–5.1)

## 2013-02-03 LAB — COMPREHENSIVE METABOLIC PANEL
ALT: 27 U/L (ref 0–53)
AST: 28 U/L (ref 0–37)
Alkaline Phosphatase: 184 U/L — ABNORMAL HIGH (ref 39–117)
CO2: 20 mEq/L (ref 19–32)
Chloride: 94 mEq/L — ABNORMAL LOW (ref 96–112)
GFR calc Af Amer: >90 mL/min (ref >90)
GFR calc non Af Amer: >90 mL/min (ref >90)
Glucose, Bld: 567 mg/dL (ref 70–99)
Sodium: 133 mEq/L — ABNORMAL LOW (ref 135–145)
Total Bilirubin: 1 mg/dL (ref 0.3–1.2)

## 2013-02-03 LAB — URINALYSIS, ROUTINE W REFLEX MICROSCOPIC
Bilirubin Urine: NEGATIVE
Glucose, UA: >1000 mg/dL — AB
Ketones, ur: NEGATIVE mg/dL
Leukocytes, UA: NEGATIVE
pH: 6 (ref 5.0–8.0)

## 2013-02-03 LAB — TYPE AND SCREEN
ABO/RH(D): O POS
Antibody Screen: NEGATIVE

## 2013-02-03 LAB — CK: Total CK: 147 U/L (ref 7–232)

## 2013-02-03 LAB — PROTIME-INR: INR: 1.38 (ref 0.00–1.49)

## 2013-02-03 LAB — ETHANOL: Alcohol, Ethyl (B): <11 mg/dL (ref 0–11)

## 2013-02-03 LAB — POCT I-STAT TROPONIN I: Troponin i, poc: 0 ng/mL (ref 0.00–0.08)

## 2013-02-03 MED ORDER — INSULIN REGULAR BOLUS VIA INFUSION
0.0000 [IU] | Freq: Three times a day (TID) | INTRAVENOUS | Status: DC
  Filled 2013-02-03: qty 10

## 2013-02-03 MED ORDER — LORAZEPAM 2 MG/ML IJ SOLN
INTRAMUSCULAR | Status: AC
  Administered 2013-02-03: 2 mg via INTRAVENOUS
  Filled 2013-02-03: qty 1

## 2013-02-03 MED ORDER — MIDAZOLAM HCL 2 MG/2ML IJ SOLN
INTRAMUSCULAR | Status: AC
  Administered 2013-02-03: 4 mg via INTRAVENOUS
  Filled 2013-02-03: qty 4

## 2013-02-03 MED ORDER — SODIUM CHLORIDE 0.9 % IV BOLUS (SEPSIS)
1000.0000 mL | Freq: Once | INTRAVENOUS | Status: AC
  Administered 2013-02-03: 1000 mL via INTRAVENOUS

## 2013-02-03 MED ORDER — HEPARIN SODIUM (PORCINE) 5000 UNIT/ML IJ SOLN
5000.0000 [IU] | Freq: Three times a day (TID) | INTRAMUSCULAR | Status: DC
  Filled 2013-02-03: qty 1

## 2013-02-03 MED ORDER — DEXTROSE-NACL 5-0.45 % IV SOLN: INTRAVENOUS | Status: DC

## 2013-02-03 MED ORDER — SODIUM CHLORIDE 0.9 % IJ SOLN
10.0000 mL | INTRAMUSCULAR | Status: DC | PRN
  Filled 2013-02-03: qty 10

## 2013-02-03 MED ORDER — SODIUM CHLORIDE 0.9 % IV SOLN
INTRAVENOUS | Status: DC
  Administered 2013-02-03: 12:00:00 via INTRAVENOUS

## 2013-02-03 MED ORDER — DEXTROSE 50 % IV SOLN: 25.0000 mL | INTRAVENOUS | Status: DC | PRN

## 2013-02-03 MED ORDER — INSULIN REGULAR HUMAN 100 UNIT/ML IJ SOLN
INTRAMUSCULAR | Status: DC
  Administered 2013-02-03: 4.5 [IU]/h via INTRAVENOUS
  Filled 2013-02-03: qty 1

## 2013-02-03 MED ORDER — SODIUM CHLORIDE 0.9 % IV SOLN: 250.0000 mL | INTRAVENOUS | Status: DC | PRN

## 2013-02-03 MED ORDER — LORAZEPAM 2 MG/ML IJ SOLN: 2.0000 mg | Freq: Once | INTRAMUSCULAR | Status: AC

## 2013-02-03 MED ORDER — ROCURONIUM BROMIDE 50 MG/5ML IV SOLN
100.0000 mg | Freq: Once | INTRAVENOUS | Status: AC
  Administered 2013-02-03: 100 mg via INTRAVENOUS
  Filled 2013-02-03: qty 10

## 2013-02-03 MED ORDER — ETOMIDATE 2 MG/ML IV SOLN
10.0000 mg | Freq: Once | INTRAVENOUS | Status: AC
  Administered 2013-02-03: 10 mg via INTRAVENOUS

## 2013-02-03 MED ORDER — SODIUM CHLORIDE 0.9 % IV SOLN
INTRAVENOUS | Status: DC
  Administered 2013-02-03 – 2013-02-04 (×3): via INTRAVENOUS

## 2013-02-03 MED ORDER — SODIUM CHLORIDE 0.9 % IV BOLUS (SEPSIS)
1000.0000 mL | INTRAVENOUS | Status: AC
  Administered 2013-02-03: 1000 mL via INTRAVENOUS

## 2013-02-03 MED ORDER — LORAZEPAM 2 MG/ML IJ SOLN
2.0000 mg | Freq: Once | INTRAMUSCULAR | Status: AC
  Administered 2013-02-03: 2 mg via INTRAVENOUS
  Filled 2013-02-03: qty 1

## 2013-02-03 MED ORDER — PROPOFOL 10 MG/ML IV EMUL
5.0000 ug/kg/min | INTRAVENOUS | Status: DC
  Administered 2013-02-03: 30 ug/kg/min via INTRAVENOUS
  Administered 2013-02-04 (×2): 45.05 ug/kg/min via INTRAVENOUS
  Administered 2013-02-04: 20 ug/kg/min via INTRAVENOUS
  Administered 2013-02-04: 40 ug/kg/min via INTRAVENOUS
  Administered 2013-02-04: 19.915 ug/kg/min via INTRAVENOUS
  Administered 2013-02-05 (×2): 40 ug/kg/min via INTRAVENOUS
  Filled 2013-02-03 (×7): qty 100

## 2013-02-03 MED ORDER — FERUMOXYTOL INJECTION 510 MG/17 ML
1020.0000 mg | Freq: Once | INTRAVENOUS | Status: DC
  Administered 2013-02-03: 1020 mg via INTRAVENOUS
  Filled 2013-02-03: qty 34

## 2013-02-03 MED ORDER — MIDAZOLAM HCL 2 MG/2ML IJ SOLN
INTRAMUSCULAR | Status: AC
  Filled 2013-02-03: qty 4

## 2013-02-03 MED ORDER — ONDANSETRON HCL 4 MG/2ML IJ SOLN
4.0000 mg | Freq: Once | INTRAMUSCULAR | Status: AC
  Administered 2013-02-03: 4 mg via INTRAVENOUS
  Filled 2013-02-03: qty 2

## 2013-02-03 MED ORDER — ZOLPIDEM TARTRATE 5 MG PO TABS: 5.0000 mg | ORAL_TABLET | Freq: Every evening | ORAL | Status: DC | PRN

## 2013-02-03 NOTE — ED Notes (Signed)
Pt arrived by ems from home, had witnessed seizure by wife, pt combative at arrival, bs over 600 per ems.  Pulled iv out enroute

## 2013-02-03 NOTE — ED Notes (Signed)
Wife states he did not eat all day, she found him disoriented and wandering in house, thought it was low blood sugar so she gave him a pastry, then found him rigid called 911

## 2013-02-03 NOTE — ED Provider Notes (Signed)
History     CSN: [id number]  Arrival date & time 02/03/13  1914   First MD Initiated Contact with Patient 02/03/13 1918      Chief Complaint  Patient presents with  . Seizures    (Consider location/radiation/quality/duration/timing/severity/associated sxs/prior treatment) HPI Comments: Pt has hx of ETOH abuse and liver failure with esophageal varices s/p frequent EGD with banding who presents with acute seizure which was witnessed by the wife.  This was acute in onset, persistent, resolved by the time fire and EMS arrived and was assocaited with incontinence and significant altered MS.  He has reportedly had a seizure in the past per the paramedics by the wife.  MR review does not show antiepileptic meds.  EMS gave IM ativan 4mg  pta as the pt was combative and ripped out IV en route.  Level 5 caveat applies 2/2 AMS  Patient is a 57 y.o. male presenting with seizures. The history is provided by the EMS personnel and medical records.  Seizures   Past Medical History  Diagnosis Date  . DM (diabetes mellitus)   . Cirrhosis     bx proven steatohepatitis with cirrhosis (2010); per note from Feb 2011, received Hep A and B vaccines in 2010  . HTN (hypertension)   . RLS (restless legs syndrome)   . Sleep apnea   . Hyperlipidemia   . IDA (iron deficiency anemia)   . GERD (gastroesophageal reflux disease)   . Depression   . Peripheral neuropathy   . Urothelial cancer     2010, paillary low-grade, h/o recurrence 2011  . B12 deficiency   . Psoriasis   . 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 Anemia due to multiple mechanisms 05/13/2011  . History of alcohol abuse 05/13/2011  . ANEMIA-IRON DEFICIENCY 03/09/2009  . S/P endoscopy May 2012    4 columns grade II esophageal varices; due for repeat in Nov 2013   . S/P colonoscopy May 2012    Tubular adenoma  . Low back pain   . Cancer     bladder ca 05/2009 removal and  w/chemo wash  . Esophageal varices with bleeding 11/24/11     s/p emergent EGD 11/25/11 by Dr. Rhea Belton at [facility], Grade III esophageal varices s/p banding X 5  . Small bowel obstruction 02/15/2012    Admitted to [facility], managed by Dr. Leticia Penna, ventral hernia manually reduced  . Ventral hernia 02/15/12  . MRSA (methicillin resistant Staphylococcus aureus)   . Acute post-ligation esophageal ulcer with hemorrhage 04/11/2012  . Sleep apnea   . Clostridium difficile infection   . Cirrhosis of liver without mention of alcohol     has received hep A/hep B vaccines, afp-03/26/11= <1.3, ct abd 06/25/12= cirrhosis and portal venous hypertension with hepatomegaly  . H. pylori infection 12/24/2012    Past Surgical History  Procedure Laterality Date  . Carpel tunnel    . Shoulder surgery    . Adrenal mass surgery  05/2009    benign, left  . Bladder surgery  01/2009 and 06/2010    cancer 2010, small recurrence in 06/2010  . Colonoscopy  04/2009    moderate int hemorrhoids, rare sigmoid diverticula, one mm sessile hyperplastic rectal polyp  . Esophagogastroduodenoscopy  03/2009    small hh  . Small bowel capsule endoscopy  03/2009    couple of small benign appearing erosions, nonbleeding  . Egd/tcs  08/2007    small hiatal hernia, pancolonic diverticula, friable anal canal, 3cm salmon colored epithelium in distal esophagus,  bx negative for Barrett's  . Skin cancer excision  Oct 2012    left arm  . Esophagogastroduodenoscopy  11/25/2011    Dr. Vedia Coffer III varices in the mid and distal esophagus, banding placed, portal astropathy  . Esophagogastroduodenoscopy  01/14/2012    Dr Rourk->4-5 columns Gr2 varices, 6 bands placed, HH, distal esophageal ulcer, portal gastropathy, antral erosions  . Esophagogastroduodenoscopy  03/17/2012    Dr. Rito Ehrlich varices status post band ligation. Hiatal hernia. Portal gastropathy.  . Umbilical hernia repair  04/05/2012    Procedure: HERNIA REPAIR UMBILICAL ADULT;  Surgeon: Fabio Bering, MD;  Location: [facility];  Service: General;   Laterality: N/A;  . Esophagogastroduodenoscopy  04/11/2012    Dr. Lysle Pearl ulcer possibly at previous banding sites with stigmata     of bleeding.  Attempt at hemostasis with hemoclip and banding was not successful resulting in recurrence of bleed/  Portal gastropathy, but no evidence of gastric varices/ Recurrent esophageal varices grade 2.  . Esophagogastroduodenoscopy  05/24/2012    Dr. Rito Ehrlich varices, portal gastropathy  . Esophagogastroduodenoscopy  04/11/12    Dr. Lorella Nimrod in the distal esophagus adherent lot and visible vessel treated with bicap. grade I varices in the distal esophagus, one ligating band placed, portal hypertensive gastropathy in the body of the stomach.  . Colonoscopy  09/29/2012    Procedure: COLONOSCOPY;  Surgeon: Corbin Ade, MD;  Location: [facility];  Service: Endoscopy;  Laterality: N/A;  8:30 am  . Esophagogastroduodenoscopy  12/24/2012    Procedure: ESOPHAGOGASTRODUODENOSCOPY (EGD);  Surgeon: Corbin Ade, MD;  Location: [facility];  Service: Endoscopy;  Laterality: N/A;  . Schlerotherapy  12/24/2012    Procedure: Theresia Majors OF VARICES;  Surgeon: Corbin Ade, MD;  Location: [facility];  Service: Endoscopy;  Laterality: N/A;  . Esophageal banding  12/24/2012    Procedure: ESOPHAGEAL BANDING;  Surgeon: Corbin Ade, MD;  Location: [facility];  Service: Endoscopy;  Laterality: N/A;    Family History  Problem Relation Age of Onset  . Cirrhosis Father     etoh  . Colon cancer Neg Hx   . Anesthesia problems Neg Hx   . Hypotension Neg Hx   . Malignant hyperthermia Neg Hx   . Pseudochol deficiency Neg Hx   . Kidney cancer Mother   . Cancer Mother   . HIV Brother   . Cirrhosis Brother     nash    History  Substance Use Topics  . Smoking status: Current Every Day Smoker -- 1.00 packs/day for 30 years    Types: Cigarettes  . Smokeless tobacco: Not on file  . Alcohol Use: No     Comment: drank heavily for few years  in 20s      Review of Systems  Unable to perform ROS: Mental status change  Neurological: Positive for seizures.    Allergies  Aspirin; Ibuprofen; and Tylenol  Home Medications   Current Outpatient Rx  Name  Route  Sig  Dispense  Refill  . carvedilol (COREG) 6.25 MG tablet   Oral   Take 6.25 mg by mouth 2 (two) times daily with a meal.         . furosemide (LASIX) 20 MG tablet   Oral   Take 20 mg by mouth daily.          [name] gabapentin (NEURONTIN) 300 MG capsule   Oral   Take 300-900 mg by mouth 4 (four) times daily.          [name]  insulin glargine (LANTUS) 100 UNIT/ML injection   Subcutaneous   Inject 50 Units into the skin 2 (two) times daily. Per sliding scale         . lactulose (CEPHULAC) 20 G packet   Oral   Take 20 g by mouth daily.         . metFORMIN (GLUCOPHAGE) 1000 MG tablet   Oral   Take 1,000 mg by mouth 2 (two) times daily with a meal.         . omeprazole (PRILOSEC) 40 MG capsule   Oral   Take 1 capsule (40 mg total) by mouth daily.   30 capsule   11   . zolpidem (AMBIEN) 5 MG tablet   Oral   Take 1-2 tablets (5-10 mg total) by mouth at bedtime as needed for sleep.   60 tablet   1     BP 129/81  Pulse 105  Temp(Src) 97.9 F (36.6 C) (Rectal)  Resp 16  Wt 190 lb (86.183 kg)  BMI 29.75 kg/m2  SpO2 98%  Physical Exam  Nursing note and vitals reviewed. Constitutional: He appears distressed.  HENT:  Head: Normocephalic and atraumatic.  Mouth/Throat: No oropharyngeal exudate.  Severely dry MM  Eyes: Conjunctivae and EOM are normal. Pupils are equal, round, and reactive to light. Right eye exhibits no discharge. Left eye exhibits no discharge. No scleral icterus.  Neck: Normal range of motion. Neck supple. No JVD present. No thyromegaly present.  Cardiovascular: Regular rhythm, normal heart sounds and intact distal pulses.  Exam reveals no gallop and no friction rub.   No murmur heard. Tachycardic to 110 on my exam, easily  palpable pedal pulses  Pulmonary/Chest: Effort normal and breath sounds normal. No respiratory distress. He has no wheezes. He has no rales.  Abdominal: Soft. Bowel sounds are normal. He exhibits no distension and no mass. There is no tenderness.  Musculoskeletal: Normal range of motion. He exhibits no edema and no tenderness.  Lymphadenopathy:    He has no cervical adenopathy.  Neurological:  Pt can mumble his name, he is agitated and trying to remove his IV which has been placed on arrival - has been able to move all ext with normal strength, not answering questions otherwise.  Skin: Skin is warm and dry. No rash noted. No erythema.  Psychiatric: He has a normal mood and affect. His behavior is normal.    ED Course  Procedures (including critical care time)  Labs Reviewed  COMPREHENSIVE METABOLIC PANEL - Abnormal; Notable for the following:    Sodium 133 (*)    Chloride 94 (*)    Glucose, Bld 567 (*)    Albumin 3.1 (*)    Alkaline Phosphatase 184 (*)    All other components within normal limits  LIPASE, BLOOD - Abnormal; Notable for the following:    Lipase 87 (*)    All other components within normal limits  LACTIC ACID, PLASMA - Abnormal; Notable for the following:    Lactic Acid, Venous 10.1 (*)    All other components within normal limits  PROTIME-INR - Abnormal; Notable for the following:    Prothrombin Time 16.6 (*)    All other components within normal limits  GLUCOSE, CAPILLARY - Abnormal; Notable for the following:    Glucose-Capillary 581 (*)    All other components within normal limits  GLUCOSE, CAPILLARY - Abnormal; Notable for the following:    Glucose-Capillary 514 (*)    All other components within normal limits  POCT I-STAT, CHEM 8 - Abnormal; Notable for the following:    Sodium 133 (*)    Glucose, Bld 574 (*)    Calcium, Ion 1.27 (*)    All other components within normal limits  APTT  ETHANOL  URINALYSIS, ROUTINE W REFLEX MICROSCOPIC  CK  POCT I-STAT  TROPONIN I  TYPE AND SCREEN   Ct Head Wo Contrast  02/03/2013  *RADIOLOGY REPORT*  Clinical Data: Altered mental status, combative, history diabetes, hypertension, bladder cancer  CT HEAD WITHOUT CONTRAST  Technique:  Contiguous axial images were obtained from the base of the skull through the vertex without contrast.  Comparison: 07/20/2008  Findings: Normal ventricular morphology. No midline shift or mass effect. Normal appearance of brain parenchyma. No intracranial hemorrhage, mass lesion or evidence of acute infarction. No extra-axial fluid collections. Bones and sinuses unremarkable.  IMPRESSION: No acute intracranial abnormalities.   Original Report Authenticated By: Ulyses Southward, M.D.    Dg Chest Portable 1 View  02/03/2013  *RADIOLOGY REPORT*  Clinical Data: Seizures  PORTABLE CHEST - 1 VIEW  Comparison: Portable exam 2007 hours compared to 12/25/2012  Findings: Tip of endotracheal tube 3.6 cm above carina. Upper normal heart size. Prominent mediastinum unchanged, at least in part related to technique. Pulmonary vascular congestion. Patchy perihilar infiltrates on left similar to previous exam. Aeration in right lung has improved since previous study. Minimal right basilar atelectasis noted. No gross pleural effusion or pneumothorax.  IMPRESSION: Satisfactory endotracheal tube position. Persistent mild patchy left perihilar infiltrates. Minimal right basilar atelectasis.   Original Report Authenticated By: Ulyses Southward, M.D.      1. Seizure   2. Lactic acidosis   3. Hyperglycemia   4. Acute encephalopathy       MDM  Per MR, pt was seen at cancer center today for Fe infusion as he has Fe def anemia.  He has no signs of bleeding at this time but is altered, likely related to post ictal state.  VS show tachycardia and ECG shows STACH but no other findings.  Concern for ETOH withdrawal as well.  CBG > 600 per EMS< confirmed here, w/u to ensue  ED ECG REPORT  I personally interpreted this  EKG   Date: 02/03/2013   Rate: 105  Rhythm: sinus tachycardia  QRS Axis: left  Intervals: normal  ST/T Wave abnormalities: normal  Conduction Disutrbances:none  Narrative Interpretation:   Old EKG Reviewed: none available  During the initial evaluation., The patient was unable to hold still because of severe agitation, pulling out IVs, rolling back and forth in the bed, moaning. He was nonfocal, seem to move all extremities but severely agitated and encephalopathic. The etiology of this is unclear at this time but the patient has a high likelihood of being coagulopathic and requiring CT scan of his head to further evaluate for hemorrhage, intracranial injury, epidural or subdural hematomas, stroke or masses. Because of his severe agitation and inability to hold still he required intubation and mechanical ventilation to control the situation and to aid with ventilations and sedation.  INTUBATION Performed by: Vida Roller  Required items: required blood products, implants, devices, and special equipment available Patient identity confirmed: provided demographic data and hospital-assigned identification number Time out: Immediately prior to procedure a "time out" was called to verify the correct patient, procedure, equipment, support staff and site/side marked as required.  Indications: Severe Agitation, acute encephalopathy, need for CT scan   Intubation method: Direct laryngoscopy   Preoxygenation: BVM  Sedatives: 10  mg Etomidate Paralytic: 100 mg Rocuronium  Tube Size: 7.5 cuffed  Post-procedure assessment: chest rise and ETCO2 monitor Breath sounds: equal and absent over the epigastrium Tube secured with: ETT holder Chest x-ray interpreted by radiologist and me.  Chest x-ray findings: endotracheal tube in appropriate position  Patient tolerated the procedure well with no immediate complications.  2018 PM  Pt's wife has now arrived and states that the pt has received the  TIPS procedure 2.5 weeks ago.  Recently started new BP meds and has been on lactulose.  IV fluids open, insulin ordered.  This management, continue IV fluids, brought workup, the patient has a significant lactic acidosis with a lactic acid of 10, blood sugar over 500, IV insulin started, CT scan of the head does not reveal any bleeding or other significant intracranial abnormalities, chest x-ray confirms endotracheal tube position. I discussed the care of the patient with the family members and with the hospitalist will come to see the patient in emergency department.  CRITICAL CARE Performed by: Vida Roller   Total critical care time: 35  Critical care time was exclusive of separately billable procedures and treating other patients.  Critical care was necessary to treat or prevent imminent or life-threatening deterioration.  Critical care was time spent personally by me on the following activities: development of treatment plan with patient and/or surrogate as well as nursing, discussions with consultants, evaluation of patient's response to treatment, examination of patient, obtaining history from patient or surrogate, ordering and performing treatments and interventions, ordering and review of laboratory studies, ordering and review of radiographic studies, pulse oximetry and re-evaluation of patient's condition.     Vida Roller, MD 02/03/13 2126

## 2013-02-03 NOTE — H&P (Signed)
[facility] History and Physical  MARC LEICHTER NFA:[id number] DOB: 1956/01/26 DOA: 02/03/2013  Referring physician: Dr. Hyacinth Meeker, ER physician. PCP: Redmond Baseman, MD    Chief Complaint: Seizure.  HPI: [name] is a 57 y.o. male who was in his usual state of health until approximately 5 hours ago when he had a sudden seizure witnessed by his wife. She describes a tonic-clonic event with unconsciousness. When he presented to the emergency room he was very combative and had to be sedated severely, it was felt that his airway was compromised and he was intubated and ventilated thereafter. He had a TIPS procedure for liver failure approximately 3 weeks ago at [facility]. He has multifactorial anemia and has recently received IV iron, which he has been receiving a monthly basis. He is diabetic. 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 He has had multiple episodes of upper GI bleeding secondary to esophageal varices. On his recent visit episode he had to be transferred to [facility] whereupon he received TIPS procedure.   Review of Systems: .  Apart from history of present illness, other systems negative.  Past Medical History  Diagnosis Date  . DM (diabetes mellitus)   . Cirrhosis     bx proven steatohepatitis with cirrhosis (2010); per note from Feb 2011, received Hep A and B vaccines in 2010  . HTN (hypertension)   . RLS (restless legs syndrome)   . Sleep apnea   . Hyperlipidemia   . IDA (iron deficiency anemia)   . GERD (gastroesophageal reflux disease)   . Depression   . Peripheral neuropathy   . Urothelial cancer     2010, paillary low-grade, h/o recurrence 2011  . B12 deficiency   . Psoriasis   . 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 Anemia due to multiple mechanisms 05/13/2011  . History of alcohol abuse 05/13/2011  . ANEMIA-IRON DEFICIENCY 03/09/2009  . S/P endoscopy May 2012    4 columns grade II esophageal varices; due for repeat  in Nov 2013   . S/P colonoscopy May 2012    Tubular adenoma  . Low back pain   . Cancer     bladder ca 05/2009 removal and  w/chemo wash  . Esophageal varices with bleeding 11/24/11    s/p emergent EGD 11/25/11 by Dr. Rhea Belton at [facility], Grade III esophageal varices s/p banding X 5  . Small bowel obstruction 02/15/2012    Admitted to [facility], managed by Dr. Leticia Penna, ventral hernia manually reduced  . Ventral hernia 02/15/12  . MRSA (methicillin resistant Staphylococcus aureus)   . Acute post-ligation esophageal ulcer with hemorrhage 04/11/2012  . Sleep apnea   . Clostridium difficile infection   . Cirrhosis of liver without mention of alcohol     has received hep A/hep B vaccines, afp-03/26/11= <1.3, ct abd 06/25/12= cirrhosis and portal venous hypertension with hepatomegaly  . H. pylori infection 12/24/2012   Past Surgical History  Procedure Laterality Date  . Carpel tunnel    . Shoulder surgery    . Adrenal mass surgery  05/2009    benign, left  . Bladder surgery  01/2009 and 06/2010    cancer 2010, small recurrence in 06/2010  . Colonoscopy  04/2009    moderate int hemorrhoids, rare sigmoid diverticula, one mm sessile hyperplastic rectal polyp  . Esophagogastroduodenoscopy  03/2009    small hh  . Small bowel capsule endoscopy  03/2009    couple of small benign appearing erosions, nonbleeding  . Egd/tcs  08/2007    small hiatal hernia, pancolonic diverticula, friable anal canal, 3cm salmon colored epithelium in distal esophagus, bx negative for Barrett's  . Skin cancer excision  Oct 2012    left arm  . Esophagogastroduodenoscopy  11/25/2011    Dr. Vedia Coffer III varices in the mid and distal esophagus, banding placed, portal astropathy  . Esophagogastroduodenoscopy  01/14/2012    Dr Rourk->4-5 columns Gr2 varices, 6 bands placed, HH, distal esophageal ulcer, portal gastropathy, antral erosions  . Esophagogastroduodenoscopy  03/17/2012    Dr. Rito Ehrlich varices status post band ligation. Hiatal  hernia. Portal gastropathy.  . Umbilical hernia repair  04/05/2012    Procedure: HERNIA REPAIR UMBILICAL ADULT;  Surgeon: Fabio Bering, MD;  Location: [facility] ORS;  Service: General;  Laterality: N/A;  . Esophagogastroduodenoscopy  04/11/2012    Dr. Lysle Pearl ulcer possibly at previous banding sites with stigmata     of bleeding.  Attempt at hemostasis with hemoclip and banding was not successful resulting in recurrence of bleed/  Portal gastropathy, but no evidence of gastric varices/ Recurrent esophageal varices grade 2.  . Esophagogastroduodenoscopy  05/24/2012    Dr. Rito Ehrlich varices, portal gastropathy  . Esophagogastroduodenoscopy  04/11/12    Dr. Lorella Nimrod in the distal esophagus adherent lot and visible vessel treated with bicap. grade I varices in the distal esophagus, one ligating band placed, portal hypertensive gastropathy in the body of the stomach.  . Colonoscopy  09/29/2012    Procedure: COLONOSCOPY;  Surgeon: Corbin Ade, MD;  Location: [facility];  Service: Endoscopy;  Laterality: N/A;  8:30 am  . Esophagogastroduodenoscopy  12/24/2012    Procedure: ESOPHAGOGASTRODUODENOSCOPY (EGD);  Surgeon: Corbin Ade, MD;  Location: [facility];  Service: Endoscopy;  Laterality: N/A;  . Schlerotherapy  12/24/2012    Procedure: Theresia Majors OF VARICES;  Surgeon: Corbin Ade, MD;  Location: [facility];  Service: Endoscopy;  Laterality: N/A;  . Esophageal banding  12/24/2012    Procedure: ESOPHAGEAL BANDING;  Surgeon: Corbin Ade, MD;  Location: [facility];  Service: Endoscopy;  Laterality: N/A;   Social History:  reports that he has been smoking Cigarettes.  He has a 30 pack-year smoking history. He does not have any smokeless tobacco history on file. He reports that he does not drink alcohol or use illicit drugs.   Allergies  Allergen Reactions  . Aspirin   . Ibuprofen Other (See Comments)    Can't take per Dr Jena Gauss- harms livers  . Tylenol  (Acetaminophen) Other (See Comments)    Causes legs to "run"    Family History  Problem Relation Age of Onset  . Cirrhosis Father     etoh  . Colon cancer Neg Hx   . Anesthesia problems Neg Hx   . Hypotension Neg Hx   . Malignant hyperthermia Neg Hx   . Pseudochol deficiency Neg Hx   . Kidney cancer Mother   . Cancer Mother   . HIV Brother   . Cirrhosis Brother     nash     Prior to Admission medications   Medication Sig Start Date End Date Taking? Authorizing Provider  carvedilol (COREG) 6.25 MG tablet Take 6.25 mg by mouth 2 (two) times daily with a meal.    Historical Provider, MD  furosemide (LASIX) 20 MG tablet Take 20 mg by mouth daily.     Historical Provider, MD  gabapentin (NEURONTIN) 300 MG capsule Take 300-900 mg by mouth 4 (four) times daily.  Historical Provider, MD  insulin glargine (LANTUS) 100 UNIT/ML injection Inject 50 Units into the skin 2 (two) times daily. Per sliding scale    Historical Provider, MD  lactulose (CEPHULAC) 20 G packet Take 20 g by mouth daily.    Historical Provider, MD  metFORMIN (GLUCOPHAGE) 1000 MG tablet Take 1,000 mg by mouth 2 (two) times daily with a meal.    Historical Provider, MD  omeprazole (PRILOSEC) 40 MG capsule Take 1 capsule (40 mg total) by mouth daily. 12/17/12   Joselyn Arrow, NP  zolpidem (AMBIEN) 5 MG tablet Take 1-2 tablets (5-10 mg total) by mouth at bedtime as needed for sleep. 02/03/13   Ellouise Newer, PA-C   Physical Exam: Filed Vitals:   02/03/13 2045 02/03/13 2100 02/03/13 2115 02/03/13 2130  BP: 126/70 129/81 110/64 134/70  Pulse: 105 106 105 105  Temp:  97.9 F (36.6 C)    TempSrc:  Rectal    Resp: 0 14 14 15   Weight:      SpO2: 98% 98% 98% 98%     General:  He is intubated on a ventilator.  Eyes: No pallor. No jaundice.  ENT: No obvious abnormalities.  Neck: No lymphadenopathy.  Cardiovascular: Heart sounds are present without murmurs or gallop rhythm.  Respiratory: Lung fields are  clear.  Abdomen: Soft, non-tender without masses.  Skin: No rash.  Musculoskeletal: No major abnormalities.  Psychiatric: Not examined.  Neurologic: Heavily sedated on a ventilator, unable to examine.  Labs on Admission:  Basic Metabolic Panel:  Recent Labs Lab 02/03/13 1920 02/03/13 1935  NA 133* 133*  K 4.0 4.2  CL 94* 99  CO2 20  --   GLUCOSE 567* 574*  BUN 14 16  CREATININE 0.78 0.90  CALCIUM 10.1  --    Liver Function Tests:  Recent Labs Lab 02/03/13 1920  AST 28  ALT 27  ALKPHOS 184*  BILITOT 1.0  PROT 7.6  ALBUMIN 3.1*    Recent Labs Lab 02/03/13 1920  LIPASE 87*    CBC:  Recent Labs Lab 02/01/13 1001 02/03/13 1935  WBC 4.6  --   NEUTROABS 3.0  --   HGB 11.7* 13.3  HCT 35.7* 39.0  MCV 83.6  --   PLT 98*  --    Cardiac Enzymes:  Recent Labs Lab 02/03/13 2126  CKTOTAL 147    BNP (last 3 results)  Recent Labs  12/25/12 0818  PROBNP <5.0   CBG:  Recent Labs Lab 02/03/13 1916 02/03/13 2100 02/03/13 2222  GLUCAP 581* 514* 465*    Radiological Exams on Admission: Ct Head Wo Contrast  02/03/2013  *RADIOLOGY REPORT*  Clinical Data: Altered mental status, combative, history diabetes, hypertension, bladder cancer  CT HEAD WITHOUT CONTRAST  Technique:  Contiguous axial images were obtained from the base of the skull through the vertex without contrast.  Comparison: 07/20/2008  Findings: Normal ventricular morphology. No midline shift or mass effect. Normal appearance of brain parenchyma. No intracranial hemorrhage, mass lesion or evidence of acute infarction. No extra-axial fluid collections. Bones and sinuses unremarkable.  IMPRESSION: No acute intracranial abnormalities.   Original Report Authenticated By: Ulyses Southward, M.D.    Dg Chest Portable 1 View  02/03/2013  *RADIOLOGY REPORT*  Clinical Data: Seizures  PORTABLE CHEST - 1 VIEW  Comparison: Portable exam 2007 hours compared to 12/25/2012  Findings: Tip of endotracheal tube 3.6  cm above carina. Upper normal heart size. Prominent mediastinum unchanged, at least in part related to technique. Pulmonary  vascular congestion. Patchy perihilar infiltrates on left similar to previous exam. Aeration in right lung has improved since previous study. Minimal right basilar atelectasis noted. No gross pleural effusion or pneumothorax.  IMPRESSION: Satisfactory endotracheal tube position. Persistent mild patchy left perihilar infiltrates. Minimal right basilar atelectasis.   Original Report Authenticated By: Ulyses Southward, M.D.       Assessment/Plan Principal Problem:   Seizure Active Problems:   HYPERTENSION   HEPATIC CIRRHOSIS   Iron deficiency anemia   1. Seizure, unclear etiology. 2. Significant hyperglycemia without DKA. 3. Liver disease with recent TIPS procedure. 4. Multifactorial anemia, receiving intravenous iron. 5. Hypertension.  Plan: 1. Admit to intensive care unit. 2. Maintain mechanical ventilation overnight. 3., Consultation. 4. Intravenous insulin to stabilize glucose. 5. Neurology consultation in the morning. Further recommendations will depend on patient's hospital progress.   Code Status: Full code.  Family Communication: Discuss plan with patient's family.   Disposition Plan: Depending on progress.   Time spent: 45 minutes.  Wilson Singer [facility] Pager [phone]  If 7PM-7AM, please contact night-coverage [email or web address] Password [email or web address] 02/03/2013, 10:50 PM

## 2013-02-03 NOTE — Progress Notes (Signed)
Tolerated well

## 2013-02-03 NOTE — ED Notes (Signed)
Remains sedated, wife at bedside. Waiting for ICU bed assignment

## 2013-02-03 NOTE — Patient Instructions (Addendum)
[facility] Discharge Instructions  RECOMMENDATIONS MADE BY THE CONSULTANT AND ANY TEST RESULTS WILL BE SENT TO YOUR REFERRING PHYSICIAN.  Feraheme (iron) infusion today.  We will continue labs every 6 weeks. Iron infusions as needed. Return to clinic in 3 months to see physician. A prescription for Ambien given to patient.  Thank you for choosing [name] to provide your oncology and hematology care.  To afford each patient quality time with our providers, please arrive at least 15 minutes before your scheduled appointment time.  With your help, our goal is to use those 15 minutes to complete the necessary work-up to ensure our physicians have the information they need to help with your evaluation and healthcare recommendations.    Effective January 1st, 2014, we ask that you re-schedule your appointment with our physicians should you arrive 10 or more minutes late for your appointment.  We strive to give you quality time with our providers, and arriving late affects you and other patients whose appointments are after yours.    Again, thank you for choosing [facility].  Our hope is that these requests will decrease the amount of time that you wait before being seen by our physicians.       _____________________________________________________________  Should you have questions after your visit to [facility], please contact our office at [phone] between the hours of 8:30 a.m. and 5:00 p.m.  Voicemails left after 4:30 p.m. will not be returned until the following business day.  For prescription refill requests, have your pharmacy contact our office with your prescription refill request.

## 2013-02-03 NOTE — ED Notes (Signed)
CRITICAL VALUE ALERT  Critical value received: Glucose 567  Date of notification:  02/03/13  Time of notification:  2107  Critical value read back:yes  Nurse who received alert:  Rudene Anda, RN  MD notified (1st page):  2107  Time of first page:  2107  Responding MD:  Dr. Hyacinth Meeker

## 2013-02-04 ENCOUNTER — Inpatient Hospital Stay (HOSPITAL_COMMUNITY)
Admit: 2013-02-04 | Discharge: 2013-02-04 | Disposition: A | Discharge status: Home / Self Care | Payer: Medicaid Other | Attending: Neurology | Admitting: Neurology

## 2013-02-04 ENCOUNTER — Inpatient Hospital Stay (HOSPITAL_COMMUNITY): Payer: Medicaid Other

## 2013-02-04 DIAGNOSIS — D509 Iron deficiency anemia, unspecified: Secondary | ICD-10-CM

## 2013-02-04 DIAGNOSIS — J9600 Acute respiratory failure, unspecified whether with hypoxia or hypercapnia: Secondary | ICD-10-CM

## 2013-02-04 DIAGNOSIS — E118 Type 2 diabetes mellitus with unspecified complications: Secondary | ICD-10-CM

## 2013-02-04 LAB — CBC
HCT: 32.4 % — ABNORMAL LOW (ref 39.0–52.0)
Hemoglobin: 10.8 g/dL — ABNORMAL LOW (ref 13.0–17.0)
RDW: 15.3 % (ref 11.5–15.5)
WBC: 6 10*3/uL (ref 4.0–10.5)

## 2013-02-04 LAB — GLUCOSE, CAPILLARY
Glucose-Capillary: 111 mg/dL — ABNORMAL HIGH (ref 70–99)
Glucose-Capillary: 124 mg/dL — ABNORMAL HIGH (ref 70–99)
Glucose-Capillary: 131 mg/dL — ABNORMAL HIGH (ref 70–99)
Glucose-Capillary: 135 mg/dL — ABNORMAL HIGH (ref 70–99)
Glucose-Capillary: 153 mg/dL — ABNORMAL HIGH (ref 70–99)
Glucose-Capillary: 171 mg/dL — ABNORMAL HIGH (ref 70–99)
Glucose-Capillary: 199 mg/dL — ABNORMAL HIGH (ref 70–99)
Glucose-Capillary: 205 mg/dL — ABNORMAL HIGH (ref 70–99)
Glucose-Capillary: 250 mg/dL — ABNORMAL HIGH (ref 70–99)

## 2013-02-04 LAB — URINALYSIS, ROUTINE W REFLEX MICROSCOPIC
Bilirubin Urine: NEGATIVE
Nitrite: NEGATIVE
Specific Gravity, Urine: >1.03 — ABNORMAL HIGH (ref 1.005–1.030)
Urobilinogen, UA: 0.2 mg/dL (ref 0.0–1.0)
pH: 6 (ref 5.0–8.0)

## 2013-02-04 LAB — COMPREHENSIVE METABOLIC PANEL
ALT: 24 U/L (ref 0–53)
Albumin: 2.8 g/dL — ABNORMAL LOW (ref 3.5–5.2)
Alkaline Phosphatase: 122 U/L — ABNORMAL HIGH (ref 39–117)
BUN: 13 mg/dL (ref 6–23)
Chloride: 104 mEq/L (ref 96–112)
Glucose, Bld: 140 mg/dL — ABNORMAL HIGH (ref 70–99)
Potassium: 3.3 mEq/L — ABNORMAL LOW (ref 3.5–5.1)
Sodium: 140 mEq/L (ref 135–145)
Total Bilirubin: 0.9 mg/dL (ref 0.3–1.2)
Total Protein: 7 g/dL (ref 6.0–8.3)

## 2013-02-04 LAB — VITAMIN B12: Vitamin B-12: 546 pg/mL (ref 211–911)

## 2013-02-04 LAB — AMMONIA: Ammonia: 64 umol/L — ABNORMAL HIGH (ref 11–60)

## 2013-02-04 MED ORDER — POTASSIUM CHLORIDE 10 MEQ/100ML IV SOLN
10.0000 meq | Freq: Once | INTRAVENOUS | Status: AC
  Administered 2013-02-04: 10 meq via INTRAVENOUS
  Filled 2013-02-04: qty 100

## 2013-02-04 MED ORDER — CHLORHEXIDINE GLUCONATE 0.12 % MT SOLN
15.0000 mL | Freq: Two times a day (BID) | OROMUCOSAL | Status: DC
  Administered 2013-02-04 – 2013-02-05 (×3): 15 mL via OROMUCOSAL
  Filled 2013-02-04 (×3): qty 15

## 2013-02-04 MED ORDER — INSULIN ASPART 100 UNIT/ML ~~LOC~~ SOLN
0.0000 [IU] | SUBCUTANEOUS | Status: DC
  Administered 2013-02-04: 3 [IU] via SUBCUTANEOUS
  Administered 2013-02-04: 2 [IU] via SUBCUTANEOUS
  Administered 2013-02-04: 3 [IU] via SUBCUTANEOUS
  Administered 2013-02-05: 2 [IU] via SUBCUTANEOUS
  Administered 2013-02-05 (×3): 3 [IU] via SUBCUTANEOUS
  Administered 2013-02-05 – 2013-02-06 (×3): 2 [IU] via SUBCUTANEOUS
  Administered 2013-02-06 (×2): 5 [IU] via SUBCUTANEOUS
  Administered 2013-02-06: 3 [IU] via SUBCUTANEOUS
  Administered 2013-02-06: 2 [IU] via SUBCUTANEOUS

## 2013-02-04 MED ORDER — ACETAMINOPHEN 650 MG RE SUPP
650.0000 mg | Freq: Four times a day (QID) | RECTAL | Status: DC | PRN
  Administered 2013-02-05 (×2): 650 mg via RECTAL
  Filled 2013-02-04 (×3): qty 1

## 2013-02-04 MED ORDER — PROPOFOL 10 MG/ML IV EMUL
INTRAVENOUS | Status: AC
  Filled 2013-02-04: qty 100

## 2013-02-04 MED ORDER — PANTOPRAZOLE SODIUM 40 MG IV SOLR
40.0000 mg | INTRAVENOUS | Status: DC
  Administered 2013-02-04 – 2013-02-05 (×2): 40 mg via INTRAVENOUS
  Filled 2013-02-04 (×2): qty 40

## 2013-02-04 MED ORDER — BIOTENE DRY MOUTH MT LIQD
15.0000 mL | Freq: Four times a day (QID) | OROMUCOSAL | Status: DC
  Administered 2013-02-04 – 2013-02-08 (×9): 15 mL via OROMUCOSAL

## 2013-02-04 NOTE — Consult Note (Signed)
Consult requested by: Dr. Karilyn Cota Consult requested for respiratory failure:  HPI: This is a 57 year old who had been in his usual state of poor health at home when he had a seizure. This was clearly a tonic-clonic seizure. He was brought to the emergency room and was combative and required significant sedation and because of that it was felt that his airway was compromised and he was intubated placed on mechanical ventilation. He has a history of cirrhosis of the liver of diabetes and he had a recent TIPS procedure for his liver failure. Other than sleep apnea he has no known respiratory disease  Past Medical History  Diagnosis Date  . DM (diabetes mellitus)   . Cirrhosis     bx proven steatohepatitis with cirrhosis (2010); per note from Feb 2011, received Hep A and B vaccines in 2010  . HTN (hypertension)   . RLS (restless legs syndrome)   . Sleep apnea   . Hyperlipidemia   . IDA (iron deficiency anemia)   . GERD (gastroesophageal reflux disease)   . Depression   . Peripheral neuropathy   . Urothelial cancer     2010, paillary low-grade, h/o recurrence 2011  . B12 deficiency   . Psoriasis   . 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 Anemia due to multiple mechanisms 05/13/2011  . History of alcohol abuse 05/13/2011  . ANEMIA-IRON DEFICIENCY 03/09/2009  . S/P endoscopy May 2012    4 columns grade II esophageal varices; due for repeat in Nov 2013   . S/P colonoscopy May 2012    Tubular adenoma  . Low back pain   . Cancer     bladder ca 05/2009 removal and  w/chemo wash  . Esophageal varices with bleeding 11/24/11    s/p emergent EGD 11/25/11 by Dr. Rhea Belton at [facility], Grade III esophageal varices s/p banding X 5  . Small bowel obstruction 02/15/2012    Admitted to [facility], managed by Dr. Leticia Penna, ventral hernia manually reduced  . Ventral hernia 02/15/12  . MRSA (methicillin resistant Staphylococcus aureus)   . Acute post-ligation esophageal ulcer with hemorrhage 04/11/2012  .  Sleep apnea   . Clostridium difficile infection   . Cirrhosis of liver without mention of alcohol     has received hep A/hep B vaccines, afp-03/26/11= <1.3, ct abd 06/25/12= cirrhosis and portal venous hypertension with hepatomegaly  . H. pylori infection 12/24/2012     Family History  Problem Relation Age of Onset  . Cirrhosis Father     etoh  . Colon cancer Neg Hx   . Anesthesia problems Neg Hx   . Hypotension Neg Hx   . Malignant hyperthermia Neg Hx   . Pseudochol deficiency Neg Hx   . Kidney cancer Mother   . Cancer Mother   . HIV Brother   . Cirrhosis Brother     nash     History   Social History  . Marital Status: Married    Spouse Name: N/A    Number of Children: 3  . Years of Education: N/A   Occupational History  . disabled    Social History Main Topics  . Smoking status: Current Every Day Smoker -- 1.00 packs/day for 30 years    Types: Cigarettes  . Smokeless tobacco: None  . Alcohol Use: No     Comment: drank heavily for few years in 20s  . Drug Use: No  . Sexually Active: Yes    Birth Control/ Protection: None   Other Topics Concern  .  None   Social History Narrative  . None     ROS: Unobtainable    Objective: Vital signs in last 24 hours: Temp:  [97.3 F (36.3 C)-100.1 F (37.8 C)] 97.9 F (36.6 C) (03/14 0833) Pulse Rate:  [93-106] 104 (03/14 0000) Resp:  [0-22] 13 (03/14 0830) BP: (100-140)/(58-85) 107/66 mmHg (03/14 0830) SpO2:  [94 %-100 %] 99 % (03/14 0000) FiO2 (%):  [39.9 %-40.5 %] 40 % (03/14 0830) Weight:  [86.183 kg (190 lb)-91.627 kg (202 lb)] 90.4 kg (199 lb 4.7 oz) (03/14 0500) Weight change:     Intake/Output from previous day: 03/13 0701 - 03/14 0700 In: -  Out: 825 [Urine:825]  PHYSICAL EXAM He is intubated on a ventilator and not responsive. He does not appear to be jaundiced. His nose and throat are clear. His heart is regular and I do not hear a murmur or gallop. His chest is clear. His abdomen is soft I don't feel  his liver. Extremities showed no edema. Central nervous system examination is not really available because of his sedation  Lab Results: Basic Metabolic Panel:  Recent Labs  16/10/96 1920 02/03/13 1935 02/04/13 0441  NA 133* 133* 140  K 4.0 4.2 3.3*  CL 94* 99 104  CO2 20  --  24  GLUCOSE 567* 574* 140*  BUN 14 16 13   CREATININE 0.78 0.90 0.77  CALCIUM 10.1  --  9.8   Liver Function Tests:  Recent Labs  02/03/13 1920 02/04/13 0441  AST 28 27  ALT 27 24  ALKPHOS 184* 122*  BILITOT 1.0 0.9  PROT 7.6 7.0  ALBUMIN 3.1* 2.8*    Recent Labs  02/03/13 1920  LIPASE 87*    Recent Labs  02/04/13 0441  AMMONIA 64*   CBC:  Recent Labs  02/01/13 1001 02/03/13 1935 02/04/13 0441  WBC 4.6  --  6.0  NEUTROABS 3.0  --   --   HGB 11.7* 13.3 10.8*  HCT 35.7* 39.0 32.4*  MCV 83.6  --  82.2  PLT 98*  --  92*   Cardiac Enzymes:  Recent Labs  02/03/13 2126  CKTOTAL 147   BNP: No results found for this basename: PROBNP,  in the last 72 hours D-Dimer: No results found for this basename: DDIMER,  in the last 72 hours CBG:  Recent Labs  02/04/13 0244 02/04/13 0344 02/04/13 0451 02/04/13 0557 02/04/13 0659 02/04/13 0830  GLUCAP 217* 171* 124* 135* 111* 131*   Hemoglobin A1C: No results found for this basename: HGBA1C,  in the last 72 hours Fasting Lipid Panel: No results found for this basename: CHOL, HDL, LDLCALC, TRIG, CHOLHDL, LDLDIRECT,  in the last 72 hours Thyroid Function Tests: No results found for this basename: TSH, T4TOTAL, FREET4, T3FREE, THYROIDAB,  in the last 72 hours Anemia Panel:  Recent Labs  02/01/13 1001  FERRITIN 31   Coagulation:  Recent Labs  02/03/13 1920  LABPROT 16.6*  INR 1.38   Urine Drug Screen: Drugs of Abuse  No results found for this basename: labopia, cocainscrnur, labbenz, amphetmu, thcu, labbarb    Alcohol Level:  Recent Labs  02/03/13 1920  ETH <11   Urinalysis:  Recent Labs  02/03/13 2112   COLORURINE YELLOW  LABSPEC 1.010  PHURINE 6.0  GLUCOSEU >1000*  HGBUR SMALL*  BILIRUBINUR NEGATIVE  KETONESUR NEGATIVE  PROTEINUR 30*  UROBILINOGEN 0.2  NITRITE NEGATIVE  LEUKOCYTESUR NEGATIVE   Misc. Labs:   ABGS:  Recent Labs  02/03/13 2352  PHART 7.354  PO2ART 109.0*  TCO2 21.8  HCO3 23.6     MICROBIOLOGY: Recent Results (from the past 240 hour(s))  MRSA PCR SCREENING     Status: None   Collection Time    02/04/13  1:30 AM      Result Value Range Status   MRSA by PCR NEGATIVE  NEGATIVE Final   Comment:            The GeneXpert MRSA Assay (FDA     approved for NASAL specimens     only), is one component of a     comprehensive MRSA colonization     surveillance program. It is not     intended to diagnose MRSA     infection nor to guide or     monitor treatment for     MRSA infections.    Studies/Results: Ct Head Wo Contrast  02/03/2013  *RADIOLOGY REPORT*  Clinical Data: Altered mental status, combative, history diabetes, hypertension, bladder cancer  CT HEAD WITHOUT CONTRAST  Technique:  Contiguous axial images were obtained from the base of the skull through the vertex without contrast.  Comparison: 07/20/2008  Findings: Normal ventricular morphology. No midline shift or mass effect. Normal appearance of brain parenchyma. No intracranial hemorrhage, mass lesion or evidence of acute infarction. No extra-axial fluid collections. Bones and sinuses unremarkable.  IMPRESSION: No acute intracranial abnormalities.   Original Report Authenticated By: Ulyses Southward, M.D.    Dg Chest Portable 1 View  02/03/2013  *RADIOLOGY REPORT*  Clinical Data: Seizures  PORTABLE CHEST - 1 VIEW  Comparison: Portable exam 2007 hours compared to 12/25/2012  Findings: Tip of endotracheal tube 3.6 cm above carina. Upper normal heart size. Prominent mediastinum unchanged, at least in part related to technique. Pulmonary vascular congestion. Patchy perihilar infiltrates on left similar to  previous exam. Aeration in right lung has improved since previous study. Minimal right basilar atelectasis noted. No gross pleural effusion or pneumothorax.  IMPRESSION: Satisfactory endotracheal tube position. Persistent mild patchy left perihilar infiltrates. Minimal right basilar atelectasis.   Original Report Authenticated By: Ulyses Southward, M.D.     Medications:  Prior to Admission:  Prescriptions prior to admission  Medication Sig Dispense Refill  . carvedilol (COREG) 6.25 MG tablet Take 6.25 mg by mouth 2 (two) times daily with a meal.      . furosemide (LASIX) 20 MG tablet Take 20 mg by mouth daily.       [name] gabapentin (NEURONTIN) 300 MG capsule Take 300-900 mg by mouth 4 (four) times daily.       . insulin glargine (LANTUS) 100 UNIT/ML injection Inject 50 Units into the skin 2 (two) times daily. Per sliding scale      . lactulose (CHRONULAC) 10 GM/15ML solution Take 20 g by mouth daily.      . metFORMIN (GLUCOPHAGE) 1000 MG tablet Take 1,000 mg by mouth 2 (two) times daily with a meal.      . omeprazole (PRILOSEC) 40 MG capsule Take 1 capsule (40 mg total) by mouth daily.  30 capsule  11  . zolpidem (AMBIEN) 5 MG tablet Take 1-2 tablets (5-10 mg total) by mouth at bedtime as needed for sleep.  60 tablet  1   Scheduled: . antiseptic oral rinse  15 mL Mouth Rinse QID  . chlorhexidine  15 mL Mouth Rinse BID  . insulin regular  0-10 Units Intravenous TID WC   Continuous: . sodium chloride 100 mL/hr at 02/04/13 0731  . insulin (NOVOLIN-R)  infusion 0.7 Units/hr (02/04/13 0835)  . propofol 45.05 mcg/kg/min (02/04/13 0500)   ZOX:WRUEAV chloride, dextrose  Assesment: He had a seizure. Because of the seizure is not clear at this point. He has respiratory failure partially because of sedation and for airway protection. Principal Problem:   Seizure Active Problems:   HYPERTENSION   HEPATIC CIRRHOSIS   Iron deficiency anemia    Plan: After we see how he does with reduced sedation we may  be able to work toward getting him off the ventilator.    LOS: 1 day   HAWKINS,EDWARD L 02/04/2013, 8:50 AM

## 2013-02-04 NOTE — Progress Notes (Signed)
Inpatient Diabetes Program Recommendations  AACE/ADA: New Consensus Statement on Inpatient Glycemic Control  Target Ranges:  Prepandial:   less than 140 mg/dL      Peak postprandial:   less than 180 mg/dL (1-2 hours)      Critically ill patients:  140 - 180 mg/dL  Pager:  [phone] Hours:  8 am-10pm   Reason for Visit: Elevated glucose in 200s  Inpatient Diabetes Program Recommendations Insulin - Basal: Add basal insulin to regimen.. Patient takes Lantus 50 BID at home HgbA1C: Check HgbA1C  Kyle Client PhD, RN, BC-ADM Diabetes Coordinator  Office:  [phone] Team Pager:  [phone]

## 2013-02-04 NOTE — Consult Note (Signed)
[facility] Charlen Bakula A. Gerilyn Pilgrim, MD     [email or web address]          Kyle [name] is an 57 y.o. male.   ASSESSMENT/PLAN: New onset seizures. There is no indication that the patient has an increased risk of developing epilepsy at this time. However, the patient needs to be evaluated for other risk including abnormal EEG findings or at the amount is not seen on CT scan. To this end, the patient will be self for an EEG and a brain MRI. The patient should not be placed on long-term antiepileptic medications as he does not meet criteria for above-stated this time. Seizure precaution however is warranted including no operating machinery, no driving, avoiding heights and swimming.  This is a middle-aged gentleman who has multiple medical comorbidities. He was noted to have the acute onset of a generalized tonoclonic seizure. This is a first time the patient has had a seizure. There is no history that I am aware of of central nervous system infections, head injuries or strokes. The patient was quite belligerent and irritated in the emergency room. He was subsequently sedated and intubated for airway protection.  GENERAL: The patient is currently intubated.  HEENT: Unremarkable.  ABDOMEN: soft  EXTREMITIES: No edema   BACK: Unremarkable.  SKIN: Normal by inspection.    MENTAL STATUS: Intubated and sedated. He does open his eyes to deep painful stimuli momentarily but closes it back.. He does not follow commands.  CRANIAL NERVES: Pupils are equal, round and reactive to light and accomodation; extra ocular movements are full via the oculocephalic reflex, there is no significant nystagmus;  upper and lower facial muscles are normal in strength and symmetric, there is no flattening of the nasolabial folds.  MOTOR: He moves both sides to deep painful stimulation. Bulk and tone seems unremarkable bilaterally.  COORDINATION: Very limited due to the sedation but no evidence of dysmetria or  tremors noted.  REFLEXES: Deep tendon reflexes are symmetrical and normal. Plantar responses are flexor bilaterally.      Past Medical History  Diagnosis Date  . DM (diabetes mellitus)   . Cirrhosis     bx proven steatohepatitis with cirrhosis (2010); per note from Feb 2011, received Hep A and B vaccines in 2010  . HTN (hypertension)   . RLS (restless legs syndrome)   . Sleep apnea   . Hyperlipidemia   . IDA (iron deficiency anemia)   . GERD (gastroesophageal reflux disease)   . Depression   . Peripheral neuropathy   . Urothelial cancer     2010, paillary low-grade, h/o recurrence 2011  . B12 deficiency   . Psoriasis   . 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 Anemia due to multiple mechanisms 05/13/2011  . History of alcohol abuse 05/13/2011  . ANEMIA-IRON DEFICIENCY 03/09/2009  . S/P endoscopy May 2012    4 columns grade II esophageal varices; due for repeat in Nov 2013   . S/P colonoscopy May 2012    Tubular adenoma  . Low back pain   . Cancer     bladder ca 05/2009 removal and  w/chemo wash  . Esophageal varices with bleeding 11/24/11    s/p emergent EGD 11/25/11 by Dr. Rhea Belton at [facility], Grade III esophageal varices s/p banding X 5  . Small bowel obstruction 02/15/2012    Admitted to [facility], managed by Dr. Leticia Penna, ventral hernia manually reduced  . Ventral hernia 02/15/12  . MRSA (methicillin resistant Staphylococcus aureus)   . Acute post-ligation  esophageal ulcer with hemorrhage 04/11/2012  . Sleep apnea   . Clostridium difficile infection   . Cirrhosis of liver without mention of alcohol     has received hep A/hep B vaccines, afp-03/26/11= <1.3, ct abd 06/25/12= cirrhosis and portal venous hypertension with hepatomegaly  . H. pylori infection 12/24/2012    Past Surgical History  Procedure Laterality Date  . Carpel tunnel    . Shoulder surgery    . Adrenal mass surgery  05/2009    benign, left  . Bladder surgery  01/2009 and 06/2010    cancer 2010, small recurrence  in 06/2010  . Colonoscopy  04/2009    moderate int hemorrhoids, rare sigmoid diverticula, one mm sessile hyperplastic rectal polyp  . Esophagogastroduodenoscopy  03/2009    small hh  . Small bowel capsule endoscopy  03/2009    couple of small benign appearing erosions, nonbleeding  . Egd/tcs  08/2007    small hiatal hernia, pancolonic diverticula, friable anal canal, 3cm salmon colored epithelium in distal esophagus, bx negative for Barrett's  . Skin cancer excision  Oct 2012    left arm  . Esophagogastroduodenoscopy  11/25/2011    Dr. Vedia Coffer III varices in the mid and distal esophagus, banding placed, portal astropathy  . Esophagogastroduodenoscopy  01/14/2012    Dr Rourk->4-5 columns Gr2 varices, 6 bands placed, HH, distal esophageal ulcer, portal gastropathy, antral erosions  . Esophagogastroduodenoscopy  03/17/2012    Dr. Rito Ehrlich varices status post band ligation. Hiatal hernia. Portal gastropathy.  . Umbilical hernia repair  04/05/2012    Procedure: HERNIA REPAIR UMBILICAL ADULT;  Surgeon: Fabio Bering, MD;  Location: [facility];  Service: General;  Laterality: N/A;  . Esophagogastroduodenoscopy  04/11/2012    Dr. Lysle Pearl ulcer possibly at previous banding sites with stigmata     of bleeding.  Attempt at hemostasis with hemoclip and banding was not successful resulting in recurrence of bleed/  Portal gastropathy, but no evidence of gastric varices/ Recurrent esophageal varices grade 2.  . Esophagogastroduodenoscopy  05/24/2012    Dr. Rito Ehrlich varices, portal gastropathy  . Esophagogastroduodenoscopy  04/11/12    Dr. Lorella Nimrod in the distal esophagus adherent lot and visible vessel treated with bicap. grade I varices in the distal esophagus, one ligating band placed, portal hypertensive gastropathy in the body of the stomach.  . Colonoscopy  09/29/2012    Procedure: COLONOSCOPY;  Surgeon: Corbin Ade, MD;  Location: [facility];  Service: Endoscopy;   Laterality: N/A;  8:30 am  . Esophagogastroduodenoscopy  12/24/2012    Procedure: ESOPHAGOGASTRODUODENOSCOPY (EGD);  Surgeon: Corbin Ade, MD;  Location: [facility];  Service: Endoscopy;  Laterality: N/A;  . Schlerotherapy  12/24/2012    Procedure: Theresia Majors OF VARICES;  Surgeon: Corbin Ade, MD;  Location: [facility];  Service: Endoscopy;  Laterality: N/A;  . Esophageal banding  12/24/2012    Procedure: ESOPHAGEAL BANDING;  Surgeon: Corbin Ade, MD;  Location: [facility];  Service: Endoscopy;  Laterality: N/A;    Family History  Problem Relation Age of Onset  . Cirrhosis Father     etoh  . Colon cancer Neg Hx   . Anesthesia problems Neg Hx   . Hypotension Neg Hx   . Malignant hyperthermia Neg Hx   . Pseudochol deficiency Neg Hx   . Kidney cancer Mother   . Cancer Mother   . HIV Brother   . Cirrhosis Brother     nash    Social History:  reports that he has been smoking Cigarettes.  He has a 30 pack-year smoking history. He does not have any smokeless tobacco history on file. He reports that he does not drink alcohol or use illicit drugs.  Allergies:  Allergies  Allergen Reactions  . Aspirin   . Ibuprofen Other (See Comments)    Can't take per Dr Jena Gauss- harms livers  . Tylenol (Acetaminophen) Other (See Comments)    Causes legs to "run"    Medications: Prior to Admission medications   Medication Sig Start Date End Date Taking? Authorizing Provider  carvedilol (COREG) 6.25 MG tablet Take 6.25 mg by mouth 2 (two) times daily with a meal.   Yes Historical Provider, MD  furosemide (LASIX) 20 MG tablet Take 20 mg by mouth daily.    Yes Historical Provider, MD  gabapentin (NEURONTIN) 300 MG capsule Take 300-900 mg by mouth 4 (four) times daily.    Yes Historical Provider, MD  insulin glargine (LANTUS) 100 UNIT/ML injection Inject 50 Units into the skin 2 (two) times daily. Per sliding scale   Yes Historical Provider, MD  lactulose (CHRONULAC) 10 GM/15ML  solution Take 20 g by mouth daily.   Yes Historical Provider, MD  metFORMIN (GLUCOPHAGE) 1000 MG tablet Take 1,000 mg by mouth 2 (two) times daily with a meal.   Yes Historical Provider, MD  omeprazole (PRILOSEC) 40 MG capsule Take 1 capsule (40 mg total) by mouth daily. 12/17/12  Yes Joselyn Arrow, NP  zolpidem (AMBIEN) 5 MG tablet Take 1-2 tablets (5-10 mg total) by mouth at bedtime as needed for sleep. 02/03/13   Ellouise Newer, PA-C   Scheduled Meds: . insulin regular  0-10 Units Intravenous TID WC   Continuous Infusions: . sodium chloride 100 mL/hr at 02/04/13 0731  . insulin (NOVOLIN-R) infusion 0.7 Units/hr (02/04/13 0835)  . propofol 45.05 mcg/kg/min (02/04/13 0500)   PRN Meds:.sodium chloride, dextrose  Blood pressure 110/64, pulse 104, temperature 97.9 F (36.6 C), temperature source Axillary, resp. rate 14, height 5\' 9"  (1.753 m), weight 90.4 kg (199 lb 4.7 oz), SpO2 99.00%.   Results for orders placed during the hospital encounter of 02/03/13 (from the past 48 hour(s))  GLUCOSE, CAPILLARY     Status: Abnormal   Collection Time    02/03/13  7:16 PM      Result Value Range   Glucose-Capillary 581 (*) 70 - 99 mg/dL   Comment 1 Documented in Chart     Comment 2 Call MD NNP PA CNM    COMPREHENSIVE METABOLIC PANEL     Status: Abnormal   Collection Time    02/03/13  7:20 PM      Result Value Range   Sodium 133 (*) 135 - 145 mEq/L   Potassium 4.0  3.5 - 5.1 mEq/L   Chloride 94 (*) 96 - 112 mEq/L   CO2 20  19 - 32 mEq/L   Glucose, Bld 567 (*) 70 - 99 mg/dL   Comment: CRITICAL RESULT CALLED TO, READ BACK BY AND VERIFIED WITH:     [place],S ON 02/03/13 AT 2105 BY LOY,C   BUN 14  6 - 23 mg/dL   Creatinine, Ser 1.61  0.50 - 1.35 mg/dL   Calcium 09.6  8.4 - 04.5 mg/dL   Total Protein 7.6  6.0 - 8.3 g/dL   Albumin 3.1 (*) 3.5 - 5.2 g/dL   AST 28  0 - 37 U/L   ALT 27  0 - 53 U/L   Alkaline Phosphatase  184 (*) 39 - 117 U/L   Total Bilirubin 1.0  0.3 - 1.2 mg/dL   GFR calc  non Af Amer >90  >90 mL/min   GFR calc Af Amer >90  >90 mL/min   Comment:            The eGFR has been calculated     using the CKD EPI equation.     This calculation has not been     validated in all clinical     situations.     eGFR's persistently     <90 mL/min signify     possible Chronic Kidney Disease.  LIPASE, BLOOD     Status: Abnormal   Collection Time    02/03/13  7:20 PM      Result Value Range   Lipase 87 (*) 11 - 59 U/L  LACTIC ACID, PLASMA     Status: Abnormal   Collection Time    02/03/13  7:20 PM      Result Value Range   Lactic Acid, Venous 10.1 (*) 0.5 - 2.2 mmol/L  APTT     Status: None   Collection Time    02/03/13  7:20 PM      Result Value Range   aPTT 34  24 - 37 seconds  PROTIME-INR     Status: Abnormal   Collection Time    02/03/13  7:20 PM      Result Value Range   Prothrombin Time 16.6 (*) 11.6 - 15.2 seconds   INR 1.38  0.00 - 1.49  TYPE AND SCREEN     Status: None   Collection Time    02/03/13  7:20 PM      Result Value Range   ABO/RH(D) O POS     Antibody Screen NEG     Sample Expiration 02/06/2013    ETHANOL     Status: None   Collection Time    02/03/13  7:20 PM      Result Value Range   Alcohol, Ethyl (B) <11  0 - 11 mg/dL   Comment:            LOWEST DETECTABLE LIMIT FOR     SERUM ALCOHOL IS 11 mg/dL     FOR MEDICAL PURPOSES ONLY  POCT I-STAT TROPONIN I     Status: None   Collection Time    02/03/13  7:34 PM      Result Value Range   Troponin i, poc 0.00  0.00 - 0.08 ng/mL   Comment 3            Comment: Due to the release kinetics of cTnI,     a negative result within the first hours     of the onset of symptoms does not rule out     myocardial infarction with certainty.     If myocardial infarction is still suspected,     repeat the test at appropriate intervals.  POCT I-STAT, CHEM 8     Status: Abnormal   Collection Time    02/03/13  7:35 PM      Result Value Range   Sodium 133 (*) 135 - 145 mEq/L   Potassium 4.2   3.5 - 5.1 mEq/L   Chloride 99  96 - 112 mEq/L   BUN 16  6 - 23 mg/dL   Creatinine, Ser 6.29  0.50 - 1.35 mg/dL   Glucose, Bld 528 (*) 70 - 99 mg/dL   Calcium, Ion 4.13 (*) 1.12 -  1.23 mmol/L   TCO2 19  0 - 100 mmol/L   Hemoglobin 13.3  13.0 - 17.0 g/dL   HCT 16.1  09.6 - 04.5 %   Comment NOTIFIED PHYSICIAN    GLUCOSE, CAPILLARY     Status: Abnormal   Collection Time    02/03/13  9:00 PM      Result Value Range   Glucose-Capillary 514 (*) 70 - 99 mg/dL  URINALYSIS, ROUTINE W REFLEX MICROSCOPIC     Status: Abnormal   Collection Time    02/03/13  9:12 PM      Result Value Range   Color, Urine YELLOW  YELLOW   APPearance HAZY (*) CLEAR   Specific Gravity, Urine 1.010  1.005 - 1.030   pH 6.0  5.0 - 8.0   Glucose, UA >1000 (*) NEGATIVE mg/dL   Hgb urine dipstick SMALL (*) NEGATIVE   Bilirubin Urine NEGATIVE  NEGATIVE   Ketones, ur NEGATIVE  NEGATIVE mg/dL   Protein, ur 30 (*) NEGATIVE mg/dL   Urobilinogen, UA 0.2  0.0 - 1.0 mg/dL   Nitrite NEGATIVE  NEGATIVE   Leukocytes, UA NEGATIVE  NEGATIVE  URINE MICROSCOPIC-ADD ON     Status: Abnormal   Collection Time    02/03/13  9:12 PM      Result Value Range   RBC / HPF 0-2  <3 RBC/hpf   Bacteria, UA FEW (*) RARE  CK     Status: None   Collection Time    02/03/13  9:26 PM      Result Value Range   Total CK 147  7 - 232 U/L  GLUCOSE, CAPILLARY     Status: Abnormal   Collection Time    02/03/13 10:22 PM      Result Value Range   Glucose-Capillary 465 (*) 70 - 99 mg/dL  GLUCOSE, CAPILLARY     Status: Abnormal   Collection Time    02/03/13 11:23 PM      Result Value Range   Glucose-Capillary 406 (*) 70 - 99 mg/dL  BLOOD GAS, ARTERIAL     Status: Abnormal   Collection Time    02/03/13 11:52 PM      Result Value Range   FIO2 40.00     Delivery systems VENTILATOR     Mode PRESSURE REGULATED VOLUME CONTROL     VT 480     Rate 14     Peep/cpap 5.0     pH, Arterial 7.354  7.350 - 7.450   pCO2 arterial 43.4  35.0 - 45.0 mmHg    pO2, Arterial 109.0 (*) 80.0 - 100.0 mmHg   Bicarbonate 23.6  20.0 - 24.0 mEq/L   TCO2 21.8  0 - 100 mmol/L   Acid-base deficit 1.2  0.0 - 2.0 mmol/L   O2 Saturation 99.3     Patient temperature 37.0     Collection site RIGHT RADIAL     Drawn by [id number]     Sample type ARTERIAL     Allens test (pass/fail) PASS  PASS  GLUCOSE, CAPILLARY     Status: Abnormal   Collection Time    02/04/13 12:40 AM      Result Value Range   Glucose-Capillary 322 (*) 70 - 99 mg/dL  MRSA PCR SCREENING     Status: None   Collection Time    02/04/13  1:30 AM      Result Value Range   MRSA by PCR NEGATIVE  NEGATIVE   Comment:  The GeneXpert MRSA Assay (FDA     approved for NASAL specimens     only), is one component of a     comprehensive MRSA colonization     surveillance program. It is not     intended to diagnose MRSA     infection nor to guide or     monitor treatment for     MRSA infections.  GLUCOSE, CAPILLARY     Status: Abnormal   Collection Time    02/04/13  1:45 AM      Result Value Range   Glucose-Capillary 270 (*) 70 - 99 mg/dL  GLUCOSE, CAPILLARY     Status: Abnormal   Collection Time    02/04/13  2:44 AM      Result Value Range   Glucose-Capillary 217 (*) 70 - 99 mg/dL   Comment 1 Notify RN    GLUCOSE, CAPILLARY     Status: Abnormal   Collection Time    02/04/13  3:44 AM      Result Value Range   Glucose-Capillary 171 (*) 70 - 99 mg/dL   Comment 1 Notify RN    CBC     Status: Abnormal   Collection Time    02/04/13  4:41 AM      Result Value Range   WBC 6.0  4.0 - 10.5 K/uL   RBC 3.94 (*) 4.22 - 5.81 MIL/uL   Hemoglobin 10.8 (*) 13.0 - 17.0 g/dL   Comment: DELTA CHECK NOTED     RESULT REPEATED AND VERIFIED   HCT 32.4 (*) 39.0 - 52.0 %   MCV 82.2  78.0 - 100.0 fL   MCH 27.4  26.0 - 34.0 pg   MCHC 33.3  30.0 - 36.0 g/dL   RDW 47.8  29.5 - 62.1 %   Platelets 92 (*) 150 - 400 K/uL  COMPREHENSIVE METABOLIC PANEL     Status: Abnormal   Collection Time    02/04/13   4:41 AM      Result Value Range   Sodium 140  135 - 145 mEq/L   Comment: DELTA CHECK NOTED   Potassium 3.3 (*) 3.5 - 5.1 mEq/L   Chloride 104  96 - 112 mEq/L   CO2 24  19 - 32 mEq/L   Glucose, Bld 140 (*) 70 - 99 mg/dL   BUN 13  6 - 23 mg/dL   Creatinine, Ser 3.08  0.50 - 1.35 mg/dL   Calcium 9.8  8.4 - 65.7 mg/dL   Total Protein 7.0  6.0 - 8.3 g/dL   Albumin 2.8 (*) 3.5 - 5.2 g/dL   AST 27  0 - 37 U/L   ALT 24  0 - 53 U/L   Alkaline Phosphatase 122 (*) 39 - 117 U/L   Total Bilirubin 0.9  0.3 - 1.2 mg/dL   GFR calc non Af Amer >90  >90 mL/min   GFR calc Af Amer >90  >90 mL/min   Comment:            The eGFR has been calculated     using the CKD EPI equation.     This calculation has not been     validated in all clinical     situations.     eGFR's persistently     <90 mL/min signify     possible Chronic Kidney Disease.  AMMONIA     Status: Abnormal   Collection Time    02/04/13  4:41 AM  Result Value Range   Ammonia 64 (*) 11 - 60 umol/L  GLUCOSE, CAPILLARY     Status: Abnormal   Collection Time    02/04/13  4:51 AM      Result Value Range   Glucose-Capillary 124 (*) 70 - 99 mg/dL   Comment 1 Notify RN    GLUCOSE, CAPILLARY     Status: Abnormal   Collection Time    02/04/13  5:57 AM      Result Value Range   Glucose-Capillary 135 (*) 70 - 99 mg/dL   Comment 1 Notify RN    GLUCOSE, CAPILLARY     Status: Abnormal   Collection Time    02/04/13  6:59 AM      Result Value Range   Glucose-Capillary 111 (*) 70 - 99 mg/dL   Comment 1 Documented in Chart    GLUCOSE, CAPILLARY     Status: Abnormal   Collection Time    02/04/13  8:30 AM      Result Value Range   Glucose-Capillary 131 (*) 70 - 99 mg/dL   Comment 1 Documented in Chart     Comment 2 Notify RN      Ct Head Wo Contrast  02/03/2013  *RADIOLOGY REPORT*  Clinical Data: Altered mental status, combative, history diabetes, hypertension, bladder cancer  CT HEAD WITHOUT CONTRAST  Technique:  Contiguous axial  images were obtained from the base of the skull through the vertex without contrast.  Comparison: 07/20/2008  Findings: Normal ventricular morphology. No midline shift or mass effect. Normal appearance of brain parenchyma. No intracranial hemorrhage, mass lesion or evidence of acute infarction. No extra-axial fluid collections. Bones and sinuses unremarkable.  IMPRESSION: No acute intracranial abnormalities.   Original Report Authenticated By: Ulyses Southward, M.D.    Dg Chest Portable 1 View  02/03/2013  *RADIOLOGY REPORT*  Clinical Data: Seizures  PORTABLE CHEST - 1 VIEW  Comparison: Portable exam 2007 hours compared to 12/25/2012  Findings: Tip of endotracheal tube 3.6 cm above carina. Upper normal heart size. Prominent mediastinum unchanged, at least in part related to technique. Pulmonary vascular congestion. Patchy perihilar infiltrates on left similar to previous exam. Aeration in right lung has improved since previous study. Minimal right basilar atelectasis noted. No gross pleural effusion or pneumothorax.  IMPRESSION: Satisfactory endotracheal tube position. Persistent mild patchy left perihilar infiltrates. Minimal right basilar atelectasis.   Original Report Authenticated By: Ulyses Southward, M.D.         Shavon Ashmore A. Gerilyn Pilgrim, M.D.  Diplomate, Biomedical engineer of Psychiatry and Neurology ( Neurology). 02/04/2013, 8:38 AM

## 2013-02-04 NOTE — Progress Notes (Signed)
UR Chart Review Completed  

## 2013-02-04 NOTE — Progress Notes (Signed)
Offsite portable EEG completed at [facility]. 

## 2013-02-04 NOTE — Care Management Note (Signed)
    Page 1 of 1   02/08/2013     2:01:10 PM   CARE MANAGEMENT NOTE 02/08/2013  Patient:  Kyle [name], Kyle [name]   Account Number:  [id number]  Date Initiated:  02/04/2013  Documentation initiated by:  Rosemary Holms  Subjective/Objective Assessment:   Pt admitted from home. Wife is sole caregiver and worried over finances. He is on disability with Medicaid. Wife inquired about being his PCA aide and getting paid. Explained that they would take his disability check to pay her. CM did     Action/Plan:   refer her to Social Services to apply for a PCS (personal care services) worker.  Pt on CPAP at home per wife.   Anticipated DC Date:  02/08/2013   Anticipated DC Plan:  HOME/SELF CARE  In-house referral  Financial Counselor  Chaplain      DC Planning Services  CM consult      Choice offered to / List presented to:             Status of service:  Completed, signed off Medicare Important Message given?   (If response is "NO", the following Medicare IM given date fields will be blank) Date Medicare IM given:   Date Additional Medicare IM given:    Discharge Disposition:  HOME/SELF CARE  Per UR Regulation:    If discussed at Long Length of Stay Meetings, dates discussed:    Comments:  02/08/13 Rosemary Holms RN BSN CM Pt agreed to Outpatient PT. Order faxed to PT department. Lubertha Basque to call and explain the financial discount program pt may qualify for to cover his PT expenses.  02/04/13 1030 Clorinda Wyble RN BSN CM Lubertha Basque to investigate other financial services such as Medicare. CM asked Deretha Emory to visit wife.

## 2013-02-04 NOTE — Progress Notes (Addendum)
INITIAL NUTRITION ASSESSMENT  DOCUMENTATION CODES Per approved criteria  -Not Applicable   INTERVENTION: If unable wean and is feasible:  Recommend:Osm 1.2  @ 20 ml/hr via OGT  and increase by 10 ml every 4 hours to goal rate of 45 ml/hr. Add 30 ml Prostat BID.  At goal rate, tube feeding regimen will provide 1496 kcal, 90 grams of protein, and 871 ml of H2O.   Propofol contributing additional 272 kcal/day  Fluids per MD goals  NUTRITION DIAGNOSIS: Inadequate oral intake related to inability to eat as evidenced by NPO status.  Goal: Pt to meet >/= 90% of their estimated nutrition needs  Monitor:  Nutrition support measures  Reason for Assessment: Mechanical ventilation  57 y.o. male  Admitting Dx: Seizure  ASSESSMENT: Pt s/p TIPS procedure. Intubated to protect airway. He has poor fuel storage capability due to hx of cirrhosis. At risk for protein catabolism after brief period of fasting. MD notes that he may possibly be extubated later today. Will continue to follow.  Patient is currently intubated, sedated on ventilator support.  MV: 6.6 ml Temp:Temp (24hrs), Avg:98.2 F (36.8 C), Min:97.4 F (36.3 C), Max:100.1 F (37.8 C)  Propofol: 10.3 ml/hr caloric load at current rate=272 kcal  Height: Ht Readings from Last 1 Encounters:  02/04/13 5\' 9"  (1.753 m)    Weight: Wt Readings from Last 1 Encounters:  02/04/13 199 lb 4.7 oz (90.4 kg)    Ideal Body Weight: 160# (72.7 kg)  % Ideal Body Weight: 117%  Wt Readings from Last 10 Encounters:  02/04/13 199 lb 4.7 oz (90.4 kg)  02/03/13 202 lb (91.627 kg)  12/25/12 267 lb 6.7 oz (121.3 kg)  12/25/12 267 lb 6.7 oz (121.3 kg)  09/28/12 234 lb 14.4 oz (106.55 kg)  09/03/12 236 lb (107.049 kg)  05/24/12 230 lb (104.327 kg)  05/24/12 230 lb (104.327 kg)  05/18/12 230 lb 9.6 oz (104.599 kg)  05/11/12 232 lb (105.235 kg)    Usual Body Weight: unknown  % Usual Body Weight: n/a  BMI:  Body mass index is 29.42  kg/(m^2). Overweight  Estimated Nutritional Needs: Kcal: 1800 Protein: 108-117 gr Fluid: >2000 ml/day  Skin:no issues noted  Diet Order: NPO  EDUCATION NEEDS: -Education not appropriate at this time   Intake/Output Summary (Last 24 hours) at 02/04/13 1542 Last data filed at 02/03/13 2306  Gross per 24 hour  Intake      0 ml  Output    825 ml  Net   -825 ml    Last BM: PTA  Labs:   Recent Labs Lab 02/03/13 1920 02/03/13 1935 02/04/13 0441  NA 133* 133* 140  K 4.0 4.2 3.3*  CL 94* 99 104  CO2 20  --  24  BUN 14 16 13   CREATININE 0.78 0.90 0.77  CALCIUM 10.1  --  9.8  GLUCOSE 567* 574* 140*    CBG (last 3)   Recent Labs  02/04/13 1030 02/04/13 1137 02/04/13 1255  GLUCAP 153* 199* 209*    Scheduled Meds: . antiseptic oral rinse  15 mL Mouth Rinse QID  . chlorhexidine  15 mL Mouth Rinse BID  . insulin aspart  0-9 Units Subcutaneous Q4H  . pantoprazole (PROTONIX) IV  40 mg Intravenous Q24H    Continuous Infusions: . sodium chloride 100 mL/hr at 02/04/13 0731  . propofol 19.915 mcg/kg/min (02/04/13 1425)    Past Medical History  Diagnosis Date  . DM (diabetes mellitus)   . Cirrhosis  bx proven steatohepatitis with cirrhosis (2010); per note from Feb 2011, received Hep A and B vaccines in 2010  . HTN (hypertension)   . RLS (restless legs syndrome)   . Sleep apnea   . Hyperlipidemia   . IDA (iron deficiency anemia)   . GERD (gastroesophageal reflux disease)   . Depression   . Peripheral neuropathy   . Urothelial cancer     2010, paillary low-grade, h/o recurrence 2011  . B12 deficiency   . Psoriasis   . 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 Anemia due to multiple mechanisms 05/13/2011  . History of alcohol abuse 05/13/2011  . ANEMIA-IRON DEFICIENCY 03/09/2009  . S/P endoscopy May 2012    4 columns grade II esophageal varices; due for repeat in Nov 2013   . S/P colonoscopy May 2012    Tubular adenoma  . Low back pain   .  Cancer     bladder ca 05/2009 removal and  w/chemo wash  . Esophageal varices with bleeding 11/24/11    s/p emergent EGD 11/25/11 by Dr. Rhea Belton at [facility], Grade III esophageal varices s/p banding X 5  . Small bowel obstruction 02/15/2012    Admitted to [facility], managed by Dr. Leticia Penna, ventral hernia manually reduced  . Ventral hernia 02/15/12  . MRSA (methicillin resistant Staphylococcus aureus)   . Acute post-ligation esophageal ulcer with hemorrhage 04/11/2012  . Sleep apnea   . Clostridium difficile infection   . Cirrhosis of liver without mention of alcohol     has received hep A/hep B vaccines, afp-03/26/11= <1.3, ct abd 06/25/12= cirrhosis and portal venous hypertension with hepatomegaly  . H. pylori infection 12/24/2012    Past Surgical History  Procedure Laterality Date  . Carpel tunnel    . Shoulder surgery    . Adrenal mass surgery  05/2009    benign, left  . Bladder surgery  01/2009 and 06/2010    cancer 2010, small recurrence in 06/2010  . Colonoscopy  04/2009    moderate int hemorrhoids, rare sigmoid diverticula, one mm sessile hyperplastic rectal polyp  . Esophagogastroduodenoscopy  03/2009    small hh  . Small bowel capsule endoscopy  03/2009    couple of small benign appearing erosions, nonbleeding  . Egd/tcs  08/2007    small hiatal hernia, pancolonic diverticula, friable anal canal, 3cm salmon colored epithelium in distal esophagus, bx negative for Barrett's  . Skin cancer excision  Oct 2012    left arm  . Esophagogastroduodenoscopy  11/25/2011    Dr. Vedia Coffer III varices in the mid and distal esophagus, banding placed, portal astropathy  . Esophagogastroduodenoscopy  01/14/2012    Dr Rourk->4-5 columns Gr2 varices, 6 bands placed, HH, distal esophageal ulcer, portal gastropathy, antral erosions  . Esophagogastroduodenoscopy  03/17/2012    Dr. Rito Ehrlich varices status post band ligation. Hiatal hernia. Portal gastropathy.  . Umbilical hernia repair  04/05/2012    Procedure:  HERNIA REPAIR UMBILICAL ADULT;  Surgeon: Fabio Bering, MD;  Location: [facility];  Service: General;  Laterality: N/A;  . Esophagogastroduodenoscopy  04/11/2012    Dr. Lysle Pearl ulcer possibly at previous banding sites with stigmata     of bleeding.  Attempt at hemostasis with hemoclip and banding was not successful resulting in recurrence of bleed/  Portal gastropathy, but no evidence of gastric varices/ Recurrent esophageal varices grade 2.  . Esophagogastroduodenoscopy  05/24/2012    Dr. Rito Ehrlich varices, portal gastropathy  . Esophagogastroduodenoscopy  04/11/12    Dr. Lorella Nimrod in the  distal esophagus adherent lot and visible vessel treated with bicap. grade I varices in the distal esophagus, one ligating band placed, portal hypertensive gastropathy in the body of the stomach.  . Colonoscopy  09/29/2012    Procedure: COLONOSCOPY;  Surgeon: Corbin Ade, MD;  Location: [facility];  Service: Endoscopy;  Laterality: N/A;  8:30 am  . Esophagogastroduodenoscopy  12/24/2012    Procedure: ESOPHAGOGASTRODUODENOSCOPY (EGD);  Surgeon: Corbin Ade, MD;  Location: [facility];  Service: Endoscopy;  Laterality: N/A;  . Schlerotherapy  12/24/2012    Procedure: Theresia Majors OF VARICES;  Surgeon: Corbin Ade, MD;  Location: [facility];  Service: Endoscopy;  Laterality: N/A;  . Esophageal banding  12/24/2012    Procedure: ESOPHAGEAL BANDING;  Surgeon: Corbin Ade, MD;  Location: [facility];  Service: Endoscopy;  Laterality: N/A;    Royann Shivers MS,RD,LDN,CSG Office: [phone] Pager: [phone]

## 2013-02-04 NOTE — Progress Notes (Signed)
Inpatient Diabetes Program Recommendations  AACE/ADA: New Consensus Statement on Inpatient Glycemic Control  Target Ranges:  Prepandial:   less than 140 mg/dL      Peak postprandial:   less than 180 mg/dL (1-2 hours)      Critically ill patients:  140 - 180 mg/dL  Pager:  [phone] Hours:  8 am-10pm   Reason for Visit: History of Diabetes and elevated glucose on admission   Inpatient Diabetes Program Recommendations HgbA1C: Check HgbA1C  Kyle [name] Client PhD, RN, BC-ADM Diabetes Coordinator  Office:  [phone] Team Pager:  [phone]

## 2013-02-04 NOTE — Progress Notes (Signed)
[facility] PROGRESS NOTE  JASN XIA ZOX:[id number] DOB: 1956-04-24 DOA: 02/03/2013 PCP: Redmond Baseman, MD  Assessment/Plan: Principal Problem:   Seizure: Unclear etiology. Possible CVA. MRI once extubated. Neurology following.  Active Problems:   DIABETES MELLITUS, TYPE II, CONTROLLED, WITH COMPLICATIONS: Came in with severe hyperglycemia, possibly secondary to seizure and respiratory failure. On insulin drip and sugars now stable. Discontinue insulin drip and placed on every 4 hours sliding scale    HYPERTENSION: Blood pressure stable.    HEPATIC CIRRHOSIS: Stable. Status post TIPS procedure. Will confirm that after TIPS, okay for MRI    History of alcohol abuse: Does not drink now    Iron deficiency anemia: Significant drop from yesterday. Hemoglobin went from 13.3-10.8. Suspect that likely he was hemoconcentrated on admission because of hyperglycemia and with fluids and support, has evened out    Acute respiratory failure: Responding to decrease and sedation. Hoping to extubate, possibly later on today.:   Hypokalemia: Replacing Code Status: Full code  Family Communication: Plan discussed with patient's wife at the bedside.  Disposition Plan: Likely here in the ICU for the next 24-48 hours before transferring to the floor   Consultants:  Neurology-[place]  Pulmonary-[place]  Procedures:  MRI and EEG pending  Antibiotics:  None  HPI/Subjective: Patient currently somnolent. Still intubated.  Objective: Filed Vitals:   02/04/13 0815 02/04/13 0830 02/04/13 0833 02/04/13 0900  BP: 107/63 107/66    Pulse:      Temp:   97.9 F (36.6 C)   TempSrc:   Axillary   Resp: 14 13  14   Height:      Weight:      SpO2:    100%    Intake/Output Summary (Last 24 hours) at 02/04/13 1138 Last data filed at 02/03/13 2306  Gross per 24 hour  Intake      0 ml  Output    825 ml  Net   -825 ml   Filed Weights   02/03/13 2002 02/04/13 0017 02/04/13 0500   Weight: 86.183 kg (190 lb) 90.8 kg (200 lb 2.8 oz) 90.4 kg (199 lb 4.7 oz)    Exam:   General:  Currently sleeping. Sedation weaned and patient was able to wake up earlier. Following some commands  Cardiovascular: Regular rate and rhythm, S1-S2  Respiratory: Clear to auscultation bilaterally  Abdomen: Soft, nondistended, hypoactive bowel sounds  Musculoskeletal: No clubbing or cyanosis or edema Neuro: Currently more somnolent and unable to get an accurate exam. Reports from nursing noted left-sided weakness more so than right great Data Reviewed: Basic Metabolic Panel:  Recent Labs Lab 02/03/13 1920 02/03/13 1935 02/04/13 0441  NA 133* 133* 140  K 4.0 4.2 3.3*  CL 94* 99 104  CO2 20  --  24  GLUCOSE 567* 574* 140*  BUN 14 16 13   CREATININE 0.78 0.90 0.77  CALCIUM 10.1  --  9.8   Liver Function Tests:  Recent Labs Lab 02/03/13 1920 02/04/13 0441  AST 28 27  ALT 27 24  ALKPHOS 184* 122*  BILITOT 1.0 0.9  PROT 7.6 7.0  ALBUMIN 3.1* 2.8*    Recent Labs Lab 02/03/13 1920  LIPASE 87*    Recent Labs Lab 02/04/13 0441  AMMONIA 64*   CBC:  Recent Labs Lab 02/01/13 1001 02/03/13 1935 02/04/13 0441  WBC 4.6  --  6.0  NEUTROABS 3.0  --   --   HGB 11.7* 13.3 10.8*  HCT 35.7* 39.0 32.4*  MCV 83.6  --  82.2  PLT 98*  --  92*   Cardiac Enzymes:  Recent Labs Lab 02/03/13 2126  CKTOTAL 147   BNP (last 3 results)  Recent Labs  12/25/12 0818  PROBNP <5.0   CBG:  Recent Labs Lab 02/04/13 0557 02/04/13 0659 02/04/13 0830 02/04/13 0928 02/04/13 1030  GLUCAP 135* 111* 131* 135* 153*       Studies: Ct Head Wo Contrast  02/03/2013   IMPRESSION: No acute intracranial abnormalities.   Original Report Authenticated By: Ulyses Southward, M.D.    Dg Chest Portable 1 View  02/03/2013    IMPRESSION: Satisfactory endotracheal tube position. Persistent mild patchy left perihilar infiltrates. Minimal right basilar atelectasis.   Original Report  Authenticated By: Ulyses Southward, M.D.     Scheduled Meds: . antiseptic oral rinse  15 mL Mouth Rinse QID  . chlorhexidine  15 mL Mouth Rinse BID  . insulin regular  0-10 Units Intravenous TID WC   Continuous Infusions: . sodium chloride 100 mL/hr at 02/04/13 0731  . insulin (NOVOLIN-R) infusion 0.7 Units/hr (02/04/13 0835)  . propofol 20 mcg/kg/min (02/04/13 1106)    Principal Problem:   Seizure Active Problems:   DIABETES MELLITUS, TYPE II, CONTROLLED, WITH COMPLICATIONS   ANEMIA-IRON DEFICIENCY   HYPERTENSION   HEPATIC CIRRHOSIS   History of alcohol abuse   Iron deficiency anemia   Acute respiratory failure    Time spent: 25 minutes    Hollice Espy  [facility] Pager [phone]. If 7PM-7AM, please contact night-coverage at [email or web address], password [facility] 02/04/2013, 11:38 AM  LOS: 1 day

## 2013-02-05 ENCOUNTER — Inpatient Hospital Stay (HOSPITAL_COMMUNITY): Payer: Medicaid Other

## 2013-02-05 DIAGNOSIS — G9340 Encephalopathy, unspecified: Secondary | ICD-10-CM

## 2013-02-05 DIAGNOSIS — R509 Fever, unspecified: Secondary | ICD-10-CM | POA: Clinically undetermined

## 2013-02-05 LAB — CBC
HCT: 32.7 % — ABNORMAL LOW (ref 39.0–52.0)
MCV: 84.5 fL (ref 78.0–100.0)
Platelets: 88 10*3/uL — ABNORMAL LOW (ref 150–400)
RBC: 3.87 MIL/uL — ABNORMAL LOW (ref 4.22–5.81)
WBC: 5.5 10*3/uL (ref 4.0–10.5)

## 2013-02-05 LAB — GLUCOSE, CAPILLARY: Glucose-Capillary: 163 mg/dL — ABNORMAL HIGH (ref 70–99)

## 2013-02-05 LAB — BASIC METABOLIC PANEL
CO2: 23 mEq/L (ref 19–32)
Chloride: 104 mEq/L (ref 96–112)
GFR calc Af Amer: >90 mL/min (ref >90)
Sodium: 138 mEq/L (ref 135–145)

## 2013-02-05 LAB — HEMOGLOBIN A1C: Hgb A1c MFr Bld: 8.5 % — ABNORMAL HIGH (ref <5.7)

## 2013-02-05 LAB — RAPID URINE DRUG SCREEN, HOSP PERFORMED
Barbiturates: NOT DETECTED
Benzodiazepines: POSITIVE — AB

## 2013-02-05 MED ORDER — LORAZEPAM 2 MG/ML IJ SOLN
1.0000 mg | INTRAMUSCULAR | Status: DC | PRN
  Administered 2013-02-05 (×3): 1 mg via INTRAVENOUS
  Filled 2013-02-05 (×3): qty 1

## 2013-02-05 MED ORDER — POTASSIUM CHLORIDE IN NACL 20-0.9 MEQ/L-% IV SOLN
INTRAVENOUS | Status: DC
  Administered 2013-02-05 (×2): via INTRAVENOUS
  Administered 2013-02-06: 20 mL/h via INTRAVENOUS

## 2013-02-05 MED ORDER — GABAPENTIN 300 MG PO CAPS
300.0000 mg | ORAL_CAPSULE | Freq: Four times a day (QID) | ORAL | Status: DC
  Administered 2013-02-05: 300 mg via ORAL
  Filled 2013-02-05: qty 1

## 2013-02-05 MED ORDER — INSULIN GLARGINE 100 UNIT/ML ~~LOC~~ SOLN
10.0000 [IU] | Freq: Two times a day (BID) | SUBCUTANEOUS | Status: DC
  Administered 2013-02-05 (×2): 10 [IU] via SUBCUTANEOUS

## 2013-02-05 MED ORDER — LEVOFLOXACIN IN D5W 750 MG/150ML IV SOLN
750.0000 mg | INTRAVENOUS | Status: DC
  Administered 2013-02-05 – 2013-02-06 (×2): 750 mg via INTRAVENOUS
  Filled 2013-02-05 (×3): qty 150

## 2013-02-05 MED ORDER — VANCOMYCIN HCL IN DEXTROSE 1-5 GM/200ML-% IV SOLN
1000.0000 mg | Freq: Three times a day (TID) | INTRAVENOUS | Status: DC
  Administered 2013-02-05 – 2013-02-08 (×9): 1000 mg via INTRAVENOUS
  Filled 2013-02-05 (×10): qty 200

## 2013-02-05 MED ORDER — PIPERACILLIN-TAZOBACTAM 3.375 G IVPB
3.3750 g | Freq: Three times a day (TID) | INTRAVENOUS | Status: DC
  Administered 2013-02-05 – 2013-02-08 (×9): 3.375 g via INTRAVENOUS
  Filled 2013-02-05 (×10): qty 50

## 2013-02-05 MED ORDER — LACTULOSE 10 GM/15ML PO SOLN
20.0000 g | Freq: Every day | ORAL | Status: DC
  Administered 2013-02-06 – 2013-02-08 (×3): 20 g via ORAL
  Filled 2013-02-05 (×3): qty 30

## 2013-02-05 NOTE — Progress Notes (Signed)
Pt seem to do better with a slow inspired time 1.11 vs 0.90 his peak pressure are much lower at 24 , mean 11, plateau is 15, still getting white froth from tube not pulmonary edema. Lungs decreased

## 2013-02-05 NOTE — Progress Notes (Signed)
ANTIBIOTIC CONSULT NOTE  Pharmacy Consult for Vancomycin, Levaquin, Zosyn Indication: rule out pneumonia  Allergies  Allergen Reactions  . Aspirin   . Ibuprofen Other (See Comments)    Can't take per Dr Jena Gauss- harms livers  . Tylenol (Acetaminophen) Other (See Comments)    Causes legs to "run"    Patient Measurements: Height: 5\' 9"  (175.3 cm) Weight: 205 lb 0.4 oz (93 kg) IBW/kg (Calculated) : 70.7  Vital Signs: Temp: 100.4 F (38 C) (03/15 0750) Temp src: Axillary (03/15 0750) BP: 109/65 mmHg (03/15 0630) Intake/Output from previous day: 03/14 0701 - 03/15 0700 In: 3905.6 [I.V.:3905.6] Out: 850 [Urine:850] Intake/Output from this shift:    Labs:  Recent Labs  02/03/13 1935 02/04/13 0441 02/05/13 0450  WBC  --  6.0 5.5  HGB 13.3 10.8* 10.6*  PLT  --  92* 88*  CREATININE 0.90 0.77 0.76   Estimated Creatinine Clearance: 117.5 ml/min (by C-G formula based on Cr of 0.76). No results found for this basename: VANCOTROUGH, Leodis Binet, VANCORANDOM, GENTTROUGH, GENTPEAK, GENTRANDOM, TOBRATROUGH, TOBRAPEAK, TOBRARND, AMIKACINPEAK, AMIKACINTROU, AMIKACIN,  in the last 72 hours   Microbiology: Recent Results (from the past 720 hour(s))  MRSA PCR SCREENING     Status: None   Collection Time    02/04/13  1:30 AM      Result Value Range Status   MRSA by PCR NEGATIVE  NEGATIVE Final   Comment:            The GeneXpert MRSA Assay (FDA     approved for NASAL specimens     only), is one component of a     comprehensive MRSA colonization     surveillance program. It is not     intended to diagnose MRSA     infection nor to guide or     monitor treatment for     MRSA infections.    Medical History: Past Medical History  Diagnosis Date  . DM (diabetes mellitus)   . Cirrhosis     bx proven steatohepatitis with cirrhosis (2010); per note from Feb 2011, received Hep A and B vaccines in 2010  . HTN (hypertension)   . RLS (restless legs syndrome)   . Sleep apnea   .  Hyperlipidemia   . IDA (iron deficiency anemia)   . GERD (gastroesophageal reflux disease)   . Depression   . Peripheral neuropathy   . Urothelial cancer     2010, paillary low-grade, h/o recurrence 2011  . B12 deficiency   . Psoriasis   . 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 Anemia due to multiple mechanisms 05/13/2011  . History of alcohol abuse 05/13/2011  . ANEMIA-IRON DEFICIENCY 03/09/2009  . S/P endoscopy May 2012    4 columns grade II esophageal varices; due for repeat in Nov 2013   . S/P colonoscopy May 2012    Tubular adenoma  . Low back pain   . Cancer     bladder ca 05/2009 removal and  w/chemo wash  . Esophageal varices with bleeding 11/24/11    s/p emergent EGD 11/25/11 by Dr. Rhea Belton at [facility], Grade III esophageal varices s/p banding X 5  . Small bowel obstruction 02/15/2012    Admitted to [facility], managed by Dr. Leticia Penna, ventral hernia manually reduced  . Ventral hernia 02/15/12  . MRSA (methicillin resistant Staphylococcus aureus)   . Acute post-ligation esophageal ulcer with hemorrhage 04/11/2012  . Sleep apnea   . Clostridium difficile infection   . Cirrhosis of liver without  mention of alcohol     has received hep A/hep B vaccines, afp-03/26/11= <1.3, ct abd 06/25/12= cirrhosis and portal venous hypertension with hepatomegaly  . H. pylori infection 12/24/2012    Medications:  Scheduled:  . antiseptic oral rinse  15 mL Mouth Rinse QID  . insulin aspart  0-9 Units Subcutaneous Q4H  . insulin glargine  10 Units Subcutaneous BID  . lactulose  20 g Oral Daily  . pantoprazole (PROTONIX) IV  40 mg Intravenous Q24H  . [COMPLETED] potassium chloride  10 mEq Intravenous Once  . [DISCONTINUED] chlorhexidine  15 mL Mouth Rinse BID  . [DISCONTINUED] insulin regular  0-10 Units Intravenous TID WC   Assessment: Okay for Protocol Estimated Creatinine Clearance: 117.5 ml/min (by C-G formula based on Cr of 0.76). Treating for HCAP/Aspiration PNA.  Goal of Therapy:   Vancomycin trough level 15-20 mcg/ml Eradicate infection.  Plan:  Zosyn 3.375gm IV every 8 hours. Levquin 750mg  IV every 24 hours. Vancomycin 1000mg  IV every 8 hours. Measure antibiotic drug levels at steady state Follow up culture results  Mady Gemma 02/05/2013,9:52 AM

## 2013-02-05 NOTE — Procedures (Signed)
Extubation Procedure Note  Patient Details:   Name: Kyle [name] DOB: 05-28-1956 MRN: [id number]   Airway Documentation:  Airway 7.5 mm (Active)  Secured at (cm) 23 cm 02/05/2013  2:49 PM  Measured From Lips 02/05/2013  2:49 PM  Secured Location Right 02/05/2013  2:49 PM  Secured By [employer] 02/05/2013  2:49 PM  Tube Holder Repositioned Yes 02/05/2013  2:49 PM  Cuff Pressure (cm H2O) 25 cm H2O 02/05/2013  2:49 PM  Site Condition Dry 02/05/2013  2:49 PM     Airway 7.5 mm (Active)    Evaluation  O2 sats: stable throughout Complications: No apparent complications Patient did tolerate procedure well. Bilateral Breath Sounds: Diminished Suctioning: Oral;Airway Yes  Katheren Shams 02/05/2013, 4:03 PM

## 2013-02-05 NOTE — Progress Notes (Signed)
Chart reviewed. Discussed with nursing staff.  [facility] PROGRESS NOTE  Kyle [name]:[id number] DOB: 05-17-1956 DOA: 02/03/2013 PCP: Redmond Baseman, MD  Assessment/Plan:    Seizure, new:  EEG pending.  Eventual MRI.  No antiepileptic drugs at this time, per neurology.  Vent-dependent respiratory failure: Per nursing staff, sedation was held for several hours yesterday and patient was agitated and unable to follow commands. Patient is on propofol which is metabolized by the liver, excreted in the urine. With liver disease, half life is likely longer. Unfortunately, most sedation is also extensively metabolized by the liver, so will sit with propofol for now. Patient also may have an encephalopathy from postictal state, or other etiology. Patient remains on propofol and it is unclear whether a wakeup assessment was done this morning. Will discuss with nursing staff. Unfortunately, a urine drug screen was not done on admission. Will check one now, but yields is lower. Serum ammonia is not terribly high, so significant hepatic encephalopathy less likely. Family members are unavailable. We will need to determine whether patient has been drinking alcohol. Hopefully, patient can come off the ventilator soon. If not, will start tube feeds. Will discuss with Dr. Juanetta Gosling who is managing the ventilator  DM 2, uncontrolled: Now off insulin drip. Will add Lantus to sliding scale.    HYPERTENSION: Blood pressure stable.    CIRRHOSIS reportedly do to nonalcoholic fatty liver disease. Status post TIPS procedure  Chronic anemia  Hypokalemia: Resolved  Low-grade fever: We will repeat chest x-ray. Check blood cultures. Urinalysis negative.  Code Status: Full code  Family Communication:   Consultants:  Neurology-[place]  Pulmonary-[place]  Procedures:  MRI and EEG pending  Antibiotics:  None  HPI/Subjective: unable  Objective: Filed Vitals:   02/05/13 0600 02/05/13 0615  02/05/13 0630 02/05/13 0750  BP: 135/74 122/75 109/65   Pulse:      Temp: 100.5 F (38.1 C)   100.4 F (38 C)  TempSrc: Rectal   Axillary  Resp:      Height:      Weight:      SpO2:        Intake/Output Summary (Last 24 hours) at 02/05/13 0850 Last data filed at 02/05/13 0500  Gross per 24 hour  Intake 1690.97 ml  Output    850 ml  Net 840.97 ml   Filed Weights   02/04/13 0017 02/04/13 0500 02/05/13 0500  Weight: 90.8 kg (200 lb 2.8 oz) 90.4 kg (199 lb 4.7 oz) 93 kg (205 lb 0.4 oz)    Exam:   General:  Intubated, sedated on Diprivan and. Appears agitated and moving all over the bed.  Cardiovascular: Regular rate and rhythm, S1-S2  Respiratory: Clear to auscultation bilaterally  Abdomen: Soft, nondistended, bowel sounds present  Musculoskeletal: No clubbing or cyanosis or edema Neuro: Agitated, moving all extremities.  Data Reviewed: Basic Metabolic Panel:  Recent Labs Lab 02/03/13 1920 02/03/13 1935 02/04/13 0441 02/05/13 0450  NA 133* 133* 140 138  K 4.0 4.2 3.3* 3.6  CL 94* 99 104 104  CO2 20  --  24 23  GLUCOSE 567* 574* 140* 187*  BUN 14 16 13 11   CREATININE 0.78 0.90 0.77 0.76  CALCIUM 10.1  --  9.8 9.3   Liver Function Tests:  Recent Labs Lab 02/03/13 1920 02/04/13 0441  AST 28 27  ALT 27 24  ALKPHOS 184* 122*  BILITOT 1.0 0.9  PROT 7.6 7.0  ALBUMIN 3.1* 2.8*    Recent Labs Lab 02/03/13  1920  LIPASE 87*    Recent Labs Lab 02/04/13 0441 02/05/13 0450  AMMONIA 64* 55   CBC:  Recent Labs Lab 02/01/13 1001 02/03/13 1935 02/04/13 0441 02/05/13 0450  WBC 4.6  --  6.0 5.5  NEUTROABS 3.0  --   --   --   HGB 11.7* 13.3 10.8* 10.6*  HCT 35.7* 39.0 32.4* 32.7*  MCV 83.6  --  82.2 84.5  PLT 98*  --  92* 88*   Cardiac Enzymes:  Recent Labs Lab 02/03/13 2126  CKTOTAL 147   BNP (last 3 results)  Recent Labs  12/25/12 0818  PROBNP <5.0   CBG:  Recent Labs Lab 02/04/13 1605 02/04/13 1946 02/04/13 2344  02/05/13 0353 02/05/13 0718  GLUCAP 217* 250* 205* 163* 174*       Studies: Ct Head Wo Contrast  02/03/2013   IMPRESSION: No acute intracranial abnormalities.   Original Report Authenticated By: Ulyses Southward, M.D.    Dg Chest Portable 1 View  02/03/2013    IMPRESSION: Satisfactory endotracheal tube position. Persistent mild patchy left perihilar infiltrates. Minimal right basilar atelectasis.   Original Report Authenticated By: Ulyses Southward, M.D.     Scheduled Meds: . antiseptic oral rinse  15 mL Mouth Rinse QID  . chlorhexidine  15 mL Mouth Rinse BID  . insulin aspart  0-9 Units Subcutaneous Q4H  . pantoprazole (PROTONIX) IV  40 mg Intravenous Q24H   Continuous Infusions: . sodium chloride 100 mL/hr at 02/04/13 1844  . propofol 20 mcg/kg/min (02/05/13 0630)    Critical care time 35 minutes.  Christiane Ha  [facility] Pager [phone]. If 7PM-7AM, please contact night-coverage at [email or web address], password [facility] 02/05/2013, 8:50 AM  LOS: 2 days

## 2013-02-05 NOTE — Progress Notes (Signed)
Subjective: He is much more alert this morning. He is mildly anxious/agitated and motions he wants the tube out. He had some fever .  Objective: Vital signs in last 24 hours: Temp:  [97.5 F (36.4 C)-101 F (38.3 C)] 100.4 F (38 C) (03/15 0750) Resp:  [0-27] 15 (03/14 2330) BP: (98-140)/(51-101) 109/65 mmHg (03/15 0630) FiO2 (%):  [39.3 %-40.6 %] 40 % (03/15 0847) Weight:  [93 kg (205 lb 0.4 oz)] 93 kg (205 lb 0.4 oz) (03/15 0500) Weight change: 6.816 kg (15 lb 0.4 oz)    Intake/Output from previous day: 03/14 0701 - 03/15 0700 In: 3905.6 [I.V.:3905.6] Out: 850 [Urine:850]  PHYSICAL EXAM General appearance: mild distress Resp: rhonchi bilaterally Cardio: regular rate and rhythm, S1, S2 normal, no murmur, click, rub or gallop GI: soft, non-tender; bowel sounds normal; no masses,  no organomegaly Extremities: extremities normal, atraumatic, no cyanosis or edema  Lab Results:    Basic Metabolic Panel:  Recent Labs  16/10/96 0441 02/05/13 0450  NA 140 138  K 3.3* 3.6  CL 104 104  CO2 24 23  GLUCOSE 140* 187*  BUN 13 11  CREATININE 0.77 0.76  CALCIUM 9.8 9.3   Liver Function Tests:  Recent Labs  02/03/13 1920 02/04/13 0441  AST 28 27  ALT 27 24  ALKPHOS 184* 122*  BILITOT 1.0 0.9  PROT 7.6 7.0  ALBUMIN 3.1* 2.8*    Recent Labs  02/03/13 1920  LIPASE 87*    Recent Labs  02/04/13 0441 02/05/13 0450  AMMONIA 64* 55   CBC:  Recent Labs  02/04/13 0441 02/05/13 0450  WBC 6.0 5.5  HGB 10.8* 10.6*  HCT 32.4* 32.7*  MCV 82.2 84.5  PLT 92* 88*   Cardiac Enzymes:  Recent Labs  02/03/13 2126  CKTOTAL 147   BNP: No results found for this basename: PROBNP,  in the last 72 hours D-Dimer: No results found for this basename: DDIMER,  in the last 72 hours CBG:  Recent Labs  02/04/13 1255 02/04/13 1605 02/04/13 1946 02/04/13 2344 02/05/13 0353 02/05/13 0718  GLUCAP 209* 217* 250* 205* 163* 174*   Hemoglobin A1C: No results found  for this basename: HGBA1C,  in the last 72 hours Fasting Lipid Panel: No results found for this basename: CHOL, HDL, LDLCALC, TRIG, CHOLHDL, LDLDIRECT,  in the last 72 hours Thyroid Function Tests:  Recent Labs  02/04/13 0900  TSH 2.418   Anemia Panel:  Recent Labs  02/04/13 0900  VITAMINB12 546   Coagulation:  Recent Labs  02/03/13 1920  LABPROT 16.6*  INR 1.38   Urine Drug Screen: Drugs of Abuse  No results found for this basename: labopia, cocainscrnur, labbenz, amphetmu, thcu, labbarb    Alcohol Level:  Recent Labs  02/03/13 1920  ETH <11   Urinalysis:  Recent Labs  02/03/13 2112 02/04/13 1332  COLORURINE YELLOW STRAW*  LABSPEC 1.010 >1.030*  PHURINE 6.0 6.0  GLUCOSEU >1000* 250*  HGBUR SMALL* TRACE*  BILIRUBINUR NEGATIVE NEGATIVE  KETONESUR NEGATIVE NEGATIVE  PROTEINUR 30* NEGATIVE  UROBILINOGEN 0.2 0.2  NITRITE NEGATIVE NEGATIVE  LEUKOCYTESUR NEGATIVE NEGATIVE   Misc. Labs:  ABGS  Recent Labs  02/03/13 2352  PHART 7.354  PO2ART 109.0*  TCO2 21.8  HCO3 23.6   CULTURES Recent Results (from the past 240 hour(s))  MRSA PCR SCREENING     Status: None   Collection Time    02/04/13  1:30 AM      Result Value Range Status   MRSA  by PCR NEGATIVE  NEGATIVE Final   Comment:            The GeneXpert MRSA Assay (FDA     approved for NASAL specimens     only), is one component of a     comprehensive MRSA colonization     surveillance program. It is not     intended to diagnose MRSA     infection nor to guide or     monitor treatment for     MRSA infections.   Studies/Results: Ct Head Wo Contrast  02/03/2013  *RADIOLOGY REPORT*  Clinical Data: Altered mental status, combative, history diabetes, hypertension, bladder cancer  CT HEAD WITHOUT CONTRAST  Technique:  Contiguous axial images were obtained from the base of the skull through the vertex without contrast.  Comparison: 07/20/2008  Findings: Normal ventricular morphology. No midline  shift or mass effect. Normal appearance of brain parenchyma. No intracranial hemorrhage, mass lesion or evidence of acute infarction. No extra-axial fluid collections. Bones and sinuses unremarkable.  IMPRESSION: No acute intracranial abnormalities.   Original Report Authenticated By: Ulyses Southward, M.D.    Dg Chest Portable 1 View  02/03/2013  *RADIOLOGY REPORT*  Clinical Data: Seizures  PORTABLE CHEST - 1 VIEW  Comparison: Portable exam 2007 hours compared to 12/25/2012  Findings: Tip of endotracheal tube 3.6 cm above carina. Upper normal heart size. Prominent mediastinum unchanged, at least in part related to technique. Pulmonary vascular congestion. Patchy perihilar infiltrates on left similar to previous exam. Aeration in right lung has improved since previous study. Minimal right basilar atelectasis noted. No gross pleural effusion or pneumothorax.  IMPRESSION: Satisfactory endotracheal tube position. Persistent mild patchy left perihilar infiltrates. Minimal right basilar atelectasis.   Original Report Authenticated By: Ulyses Southward, M.D.     Medications:  Prior to Admission:  Prescriptions prior to admission  Medication Sig Dispense Refill  . carvedilol (COREG) 6.25 MG tablet Take 6.25 mg by mouth 2 (two) times daily with a meal.      . furosemide (LASIX) 20 MG tablet Take 20 mg by mouth daily.       [name] gabapentin (NEURONTIN) 300 MG capsule Take 300-900 mg by mouth 4 (four) times daily.       . insulin glargine (LANTUS) 100 UNIT/ML injection Inject 50 Units into the skin 2 (two) times daily. Per sliding scale      . lactulose (CHRONULAC) 10 GM/15ML solution Take 20 g by mouth daily.      . metFORMIN (GLUCOPHAGE) 1000 MG tablet Take 1,000 mg by mouth 2 (two) times daily with a meal.      . omeprazole (PRILOSEC) 40 MG capsule Take 1 capsule (40 mg total) by mouth daily.  30 capsule  11  . zolpidem (AMBIEN) 5 MG tablet Take 1-2 tablets (5-10 mg total) by mouth at bedtime as needed for sleep.  60  tablet  1   Scheduled: . antiseptic oral rinse  15 mL Mouth Rinse QID  . insulin aspart  0-9 Units Subcutaneous Q4H  . insulin glargine  10 Units Subcutaneous BID  . lactulose  20 g Oral Daily  . pantoprazole (PROTONIX) IV  40 mg Intravenous Q24H   Continuous: . 0.9 % NaCl with KCl 20 mEq / L    . propofol 20 mcg/kg/min (02/05/13 0630)   RUE:AVWUJW chloride, acetaminophen, dextrose, LORazepam  Assesment: He was admitted with a seizure. He's had some fever and has what looks like a perihilar infiltrate on chest x-ray so I  think we need to go in and treat him for pneumonia. He is much more alert. I think he may be able to be weaned today. I think that pneumonia would likely be aspiration but since he was just in the hospital and has multiple medical problems including diabetes and cirrhosis I think we should treat him as if this is healthcare associated pneumonia. He does have some secretions. Principal Problem:   Seizure Active Problems:   ANEMIA-IRON DEFICIENCY   HYPERTENSION   HEPATIC CIRRHOSIS   History of alcohol abuse   Iron deficiency anemia   Acute respiratory failure   DM (diabetes mellitus), type 2, uncontrolled   Fever, unspecified    Plan: Add antibiotics. I still think he may be able to come off the ventilator today    LOS: 2 days   Maximiano Lott L 02/05/2013, 9:31 AM

## 2013-02-05 NOTE — Progress Notes (Signed)
Respiratory Therapy: Patient aroused off sedation; SBT attempted by failed; placed on PS to work patient. Patient still agitated. To monitor and report progress. Charlott Holler RRT

## 2013-02-06 ENCOUNTER — Inpatient Hospital Stay (HOSPITAL_COMMUNITY): Payer: Medicaid Other

## 2013-02-06 DIAGNOSIS — G2581 Restless legs syndrome: Secondary | ICD-10-CM

## 2013-02-06 LAB — BASIC METABOLIC PANEL
BUN: 6 mg/dL (ref 6–23)
Chloride: 103 mEq/L (ref 96–112)
Creatinine, Ser: 0.69 mg/dL (ref 0.50–1.35)
GFR calc Af Amer: >90 mL/min (ref >90)

## 2013-02-06 LAB — GLUCOSE, CAPILLARY
Glucose-Capillary: 169 mg/dL — ABNORMAL HIGH (ref 70–99)
Glucose-Capillary: 178 mg/dL — ABNORMAL HIGH (ref 70–99)

## 2013-02-06 MED ORDER — GABAPENTIN 100 MG PO CAPS
100.0000 mg | ORAL_CAPSULE | Freq: Four times a day (QID) | ORAL | Status: DC
  Administered 2013-02-06 – 2013-02-08 (×8): 100 mg via ORAL
  Filled 2013-02-06 (×14): qty 1

## 2013-02-06 MED ORDER — INSULIN ASPART 100 UNIT/ML ~~LOC~~ SOLN
0.0000 [IU] | Freq: Three times a day (TID) | SUBCUTANEOUS | Status: DC
  Administered 2013-02-07: 9 [IU] via SUBCUTANEOUS
  Administered 2013-02-07: 7 [IU] via SUBCUTANEOUS
  Administered 2013-02-07 – 2013-02-08 (×2): 5 [IU] via SUBCUTANEOUS

## 2013-02-06 MED ORDER — INSULIN GLARGINE 100 UNIT/ML ~~LOC~~ SOLN
15.0000 [IU] | Freq: Two times a day (BID) | SUBCUTANEOUS | Status: DC
  Administered 2013-02-06 (×2): 15 [IU] via SUBCUTANEOUS

## 2013-02-06 NOTE — Progress Notes (Addendum)
The patient was extubated yesterday. Required gabapentin for severe restless leg syndrome. Started on antibiotics for possible pneumonia  [facility] PROGRESS NOTE  MANG HAZELRIGG AVW:[id number] DOB: [birth year] DOA: 02/03/2013 PCP: Redmond Baseman, MD  Assessment/Plan:    Seizure, new:  EEG pending.  MRI tomorrow.  No antiepileptic drugs at this time, per neurology.  VDRF:  Extubated.  Change to step down status. Increase activity. PT consult. Patient appears very weak at this time. Advance to full liquid diet.  DM 2, uncontrolled: Now off insulin drip. Increase Lantus to 15 units twice a day.  Possible pneumonia: Repeat chest x-ray shows no infiltrate, but patient was febrile yesterday. Continue antibiotics for now.    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 Status post TIPS procedure  Chronic anemia  RLS: patient appears very weak. Will decrease gabapentin dose.   Code Status: Full code  Family Communication: no family present currently  Dispo:  Await PT eval   Consultants:  Neurology-[place]  Pulmonary-[place] signed off  Procedures:  MRI and EEG pending   HPI/Subjective: Feels weak. Hungry. No focal weakness. Denies shortness of breath. Denies cough. Per nursing staff was very agitated yesterday prior to getting his gabapentin.  Objective: Filed Vitals:   02/06/13 0300 02/06/13 0400 02/06/13 0500 02/06/13 0800  BP: 121/62     Pulse:      Temp:  98.7 F (37.1 C)  98.6 F (37 C)  TempSrc:  Axillary  Axillary  Resp: 19     Height:      Weight:   96.7 kg (213 lb 3 oz)   SpO2:        Intake/Output Summary (Last 24 hours) at 02/06/13 0917 Last data filed at 02/06/13 0300  Gross per 24 hour  Intake   2225 ml  Output    700 ml  Net   1525 ml   Filed Weights   02/04/13 0500 02/05/13 0500 02/06/13 0500  Weight: 90.4 kg (199 lb 4.7 oz) 93 kg (205 lb 0.4 oz) 96.7 kg (213 lb 3 oz)    Exam:   General:   Weak appearing.  Answers questions appropriatel, but voice barely audible.    HEENT:  Mouth with inssipated secretions  Cardiovascular: Regular rate and rhythm, S1-S2  Respiratory: Clear to auscultation bilaterally  Abdomen: Soft, nondistended, bowel sounds present  Musculoskeletal: No clubbing or cyanosis or edema Neuro: no cranial nerve defecits or focal weakness  Data Reviewed: Basic Metabolic Panel:  Recent Labs Lab 02/03/13 1920 02/03/13 1935 02/04/13 0441 02/05/13 0450 02/06/13 0449  NA 133* 133* 140 138 138  K 4.0 4.2 3.3* 3.6 3.7  CL 94* 99 104 104 103  CO2 20  --  24 23 25   GLUCOSE 567* 574* 140* 187* 189*  BUN 14 16 13 11 6   CREATININE 0.78 0.90 0.77 0.76 0.69  CALCIUM 10.1  --  9.8 9.3 8.9   Liver Function Tests:  Recent Labs Lab 02/03/13 1920 02/04/13 0441  AST 28 27  ALT 27 24  ALKPHOS 184* 122*  BILITOT 1.0 0.9  PROT 7.6 7.0  ALBUMIN 3.1* 2.8*    Recent Labs Lab 02/03/13 1920  LIPASE 87*    Recent Labs Lab 02/04/13 0441 02/05/13 0450  AMMONIA 64* 55   CBC:  Recent Labs Lab 02/01/13 1001 02/03/13 1935 02/04/13 0441 02/05/13 0450  WBC 4.6  --  6.0 5.5  NEUTROABS 3.0  --   --   --  HGB 11.7* 13.3 10.8* 10.6*  HCT 35.7* 39.0 32.4* 32.7*  MCV 83.6  --  82.2 84.5  PLT 98*  --  92* 88*   Cardiac Enzymes:  Recent Labs Lab 02/03/13 2126  CKTOTAL 147   BNP (last 3 results)  Recent Labs  12/25/12 0818  PROBNP <5.0   CBG:  Recent Labs Lab 02/05/13 1600 02/05/13 1957 02/06/13 0017 02/06/13 0425 02/06/13 0822  GLUCAP 246* 214* 178* 169* 206*       Studies: Ct Head Wo Contrast  02/03/2013   IMPRESSION: No acute intracranial abnormalities.   Original Report Authenticated By: Ulyses Southward, M.D.    Dg Chest Portable 1 View  02/03/2013    IMPRESSION: Satisfactory endotracheal tube position. Persistent mild patchy left perihilar infiltrates. Minimal right basilar atelectasis.   Original Report Authenticated By: Ulyses Southward, M.D.     Scheduled Meds: . antiseptic oral rinse  15 mL Mouth Rinse QID  . gabapentin  300 mg Oral QID  . insulin aspart  0-9 Units Subcutaneous Q4H  . insulin glargine  10 Units Subcutaneous BID  . lactulose  20 g Oral Daily  . levofloxacin (LEVAQUIN) IV  750 mg Intravenous Q24H  . pantoprazole (PROTONIX) IV  40 mg Intravenous Q24H  . piperacillin-tazobactam (ZOSYN)  IV  3.375 g Intravenous Q8H  . vancomycin  1,000 mg Intravenous Q8H   Continuous Infusions: . 0.9 % NaCl with KCl 20 mEq / L 100 mL/hr at 02/05/13 2218    time 35 minutes.  Christiane Ha  [facility] Pager [phone]. If 7PM-7AM, please contact night-coverage at [email or web address], password [facility] 02/06/2013, 9:17 AM  LOS: 3 days

## 2013-02-06 NOTE — Progress Notes (Signed)
Subjective: He was successfully extubated yesterday. He appears to be doing well. He has no respiratory complaints and has done well since he has been off the ventilator  Objective: Vital signs in last 24 hours: Temp:  [97.8 F (36.6 C)-99.7 F (37.6 C)] 98.6 F (37 C) (03/16 0800) Pulse Rate:  [94-97] 94 (03/15 2213) Resp:  [11-25] 19 (03/16 0300) BP: (99-152)/(54-90) 121/62 mmHg (03/16 0300) SpO2:  [97 %-100 %] 100 % (03/15 2213) FiO2 (%):  [39.6 %-40.3 %] 40 % (03/15 1500) Weight:  [96.7 kg (213 lb 3 oz)] 96.7 kg (213 lb 3 oz) (03/16 0500) Weight change: 3.7 kg (8 lb 2.5 oz)    Intake/Output from previous day: 03/15 0701 - 03/16 0700 In: 2225 [I.V.:1575; IV Piggyback:650] Out: 700 [Urine:700]  PHYSICAL EXAM General appearance: alert, cooperative and mild distress Resp: clear to auscultation bilaterally Cardio: regular rate and rhythm, S1, S2 normal, no murmur, click, rub or gallop GI: soft, non-tender; bowel sounds normal; no masses,  no organomegaly Extremities: extremities normal, atraumatic, no cyanosis or edema  Lab Results:    Basic Metabolic Panel:  Recent Labs  96/04/54 0450 02/06/13 0449  NA 138 138  K 3.6 3.7  CL 104 103  CO2 23 25  GLUCOSE 187* 189*  BUN 11 6  CREATININE 0.76 0.69  CALCIUM 9.3 8.9   Liver Function Tests:  Recent Labs  02/03/13 1920 02/04/13 0441  AST 28 27  ALT 27 24  ALKPHOS 184* 122*  BILITOT 1.0 0.9  PROT 7.6 7.0  ALBUMIN 3.1* 2.8*    Recent Labs  02/03/13 1920  LIPASE 87*    Recent Labs  02/04/13 0441 02/05/13 0450  AMMONIA 64* 55   CBC:  Recent Labs  02/04/13 0441 02/05/13 0450  WBC 6.0 5.5  HGB 10.8* 10.6*  HCT 32.4* 32.7*  MCV 82.2 84.5  PLT 92* 88*   Cardiac Enzymes:  Recent Labs  02/03/13 2126  CKTOTAL 147   BNP: No results found for this basename: PROBNP,  in the last 72 hours D-Dimer: No results found for this basename: DDIMER,  in the last 72 hours CBG:  Recent Labs   02/05/13 1119 02/05/13 1600 02/05/13 1957 02/06/13 0017 02/06/13 0425 02/06/13 0822  GLUCAP 192* 246* 214* 178* 169* 206*   Hemoglobin A1C:  Recent Labs  02/05/13 0935  HGBA1C 8.5*   Fasting Lipid Panel:  Recent Labs  02/06/13 0449  TRIG 137   Thyroid Function Tests:  Recent Labs  02/04/13 0900  TSH 2.418   Anemia Panel:  Recent Labs  02/04/13 0900  VITAMINB12 546   Coagulation:  Recent Labs  02/03/13 1920  LABPROT 16.6*  INR 1.38   Urine Drug Screen: Drugs of Abuse     Component Value Date/Time   LABOPIA NONE DETECTED 02/05/2013 0942   COCAINSCRNUR NONE DETECTED 02/05/2013 0942   LABBENZ POSITIVE* 02/05/2013 0942   AMPHETMU NONE DETECTED 02/05/2013 0942   THCU NONE DETECTED 02/05/2013 0942   LABBARB NONE DETECTED 02/05/2013 0942    Alcohol Level:  Recent Labs  02/03/13 1920  ETH <11   Urinalysis:  Recent Labs  02/03/13 2112 02/04/13 1332  COLORURINE YELLOW STRAW*  LABSPEC 1.010 >1.030*  PHURINE 6.0 6.0  GLUCOSEU >1000* 250*  HGBUR SMALL* TRACE*  BILIRUBINUR NEGATIVE NEGATIVE  KETONESUR NEGATIVE NEGATIVE  PROTEINUR 30* NEGATIVE  UROBILINOGEN 0.2 0.2  NITRITE NEGATIVE NEGATIVE  LEUKOCYTESUR NEGATIVE NEGATIVE   Misc. Labs:  ABGS  Recent Labs  02/03/13 2352  PHART  7.354  PO2ART 109.0*  TCO2 21.8  HCO3 23.6   CULTURES Recent Results (from the past 240 hour(s))  MRSA PCR SCREENING     Status: None   Collection Time    02/04/13  1:30 AM      Result Value Range Status   MRSA by PCR NEGATIVE  NEGATIVE Final   Comment:            The GeneXpert MRSA Assay (FDA     approved for NASAL specimens     only), is one component of a     comprehensive MRSA colonization     surveillance program. It is not     intended to diagnose MRSA     infection nor to guide or     monitor treatment for     MRSA infections.  CULTURE, BLOOD (ROUTINE X 2)     Status: None   Collection Time    02/05/13  9:33 AM      Result Value Range Status    Specimen Description BLOOD RIGHT HAND   Final   Special Requests BOTTLES DRAWN AEROBIC AND ANAEROBIC 8  CC  EACH   Final   Culture NO GROWTH 1 DAY   Final   Report Status PENDING   Incomplete  CULTURE, BLOOD (ROUTINE X 2)     Status: None   Collection Time    02/05/13  9:35 AM      Result Value Range Status   Specimen Description BLOOD LEFT HAND   Final   Special Requests BOTTLES DRAWN AEROBIC AND ANAEROBIC 8  CC  EACH   Final   Culture NO GROWTH 1 DAY   Final   Report Status PENDING   Incomplete   Studies/Results: Dg Chest Port 1 View  02/05/2013  *RADIOLOGY REPORT*  Clinical Data: Fever.  Seizure on ventilator.  Rule out infiltrate.  PORTABLE CHEST - 1 VIEW  Comparison: 03/13  Findings: Film is made with shallow lung inflation.  The patient has been extubated.  Heart size is mildly enlarged.  There is prominence of interstitial markings and pulmonary vascular congestion.  There are no focal consolidations or overt edema.  IMPRESSION:  1.  Status post extubation. 2.  Mild cardiomegaly and pulmonary vascular congestion.   Original Report Authenticated By: Norva Pavlov, M.D.     Medications:  Prior to Admission:  Prescriptions prior to admission  Medication Sig Dispense Refill  . carvedilol (COREG) 6.25 MG tablet Take 6.25 mg by mouth 2 (two) times daily with a meal.      . furosemide (LASIX) 20 MG tablet Take 20 mg by mouth daily.       [name] gabapentin (NEURONTIN) 300 MG capsule Take 300-900 mg by mouth 4 (four) times daily.       . insulin glargine (LANTUS) 100 UNIT/ML injection Inject 50 Units into the skin 2 (two) times daily. Per sliding scale      . lactulose (CHRONULAC) 10 GM/15ML solution Take 20 g by mouth daily.      . metFORMIN (GLUCOPHAGE) 1000 MG tablet Take 1,000 mg by mouth 2 (two) times daily with a meal.      . omeprazole (PRILOSEC) 40 MG capsule Take 1 capsule (40 mg total) by mouth daily.  30 capsule  11  . zolpidem (AMBIEN) 5 MG tablet Take 1-2 tablets (5-10 mg total)  by mouth at bedtime as needed for sleep.  60 tablet  1   Scheduled: . antiseptic oral rinse  15  mL Mouth Rinse QID  . gabapentin  300 mg Oral QID  . insulin aspart  0-9 Units Subcutaneous Q4H  . insulin glargine  10 Units Subcutaneous BID  . lactulose  20 g Oral Daily  . levofloxacin (LEVAQUIN) IV  750 mg Intravenous Q24H  . pantoprazole (PROTONIX) IV  40 mg Intravenous Q24H  . piperacillin-tazobactam (ZOSYN)  IV  3.375 g Intravenous Q8H  . vancomycin  1,000 mg Intravenous Q8H   Continuous: . 0.9 % NaCl with KCl 20 mEq / L 100 mL/hr at 02/05/13 2218   ZOX:WRUEAV chloride, acetaminophen, dextrose, LORazepam  Assesment: He had a seizure in the exact etiology of that is not clear. This led to intubation and mechanical ventilation for airway protection due to the difficulty in getting him sedated. He is now off the ventilator and seems to be doing quite well. Principal Problem:   Seizure Active Problems:   ANEMIA-IRON DEFICIENCY   HYPERTENSION   HEPATIC CIRRHOSIS   History of alcohol abuse   Iron deficiency anemia   Acute respiratory failure   DM (diabetes mellitus), type 2, uncontrolled   Fever, unspecified    Plan: His seizure is being worked up. I will plan to sign off at this point. Thanks for allow me to see him with you    LOS: 3 days   Tyreka Henneke L 02/06/2013, 9:09 AM

## 2013-02-06 NOTE — Progress Notes (Signed)
Called by RN regarding right ankle pain with weight bearing.  Boggy and tender medially.  Decreased ROM due to pain. Will check xray.

## 2013-02-07 ENCOUNTER — Encounter: Payer: Self-pay | Admitting: Internal Medicine

## 2013-02-07 ENCOUNTER — Encounter (HOSPITAL_COMMUNITY): Payer: Self-pay | Admitting: Dietician

## 2013-02-07 ENCOUNTER — Inpatient Hospital Stay (HOSPITAL_COMMUNITY): Payer: Medicaid Other

## 2013-02-07 DIAGNOSIS — J690 Pneumonitis due to inhalation of food and vomit: Secondary | ICD-10-CM

## 2013-02-07 LAB — GLUCOSE, CAPILLARY
Glucose-Capillary: 254 mg/dL — ABNORMAL HIGH (ref 70–99)
Glucose-Capillary: 330 mg/dL — ABNORMAL HIGH (ref 70–99)

## 2013-02-07 LAB — BASIC METABOLIC PANEL
CO2: 24 mEq/L (ref 19–32)
Glucose, Bld: 230 mg/dL — ABNORMAL HIGH (ref 70–99)
Potassium: 3.8 mEq/L (ref 3.5–5.1)
Sodium: 136 mEq/L (ref 135–145)

## 2013-02-07 MED ORDER — INSULIN GLARGINE 100 UNIT/ML ~~LOC~~ SOLN
25.0000 [IU] | Freq: Two times a day (BID) | SUBCUTANEOUS | Status: DC
  Administered 2013-02-07 – 2013-02-08 (×2): 25 [IU] via SUBCUTANEOUS

## 2013-02-07 MED ORDER — DIPHENHYDRAMINE HCL 50 MG/ML IJ SOLN
25.0000 mg | Freq: Every evening | INTRAMUSCULAR | Status: DC | PRN
  Administered 2013-02-08: 25 mg via INTRAVENOUS
  Filled 2013-02-07: qty 1

## 2013-02-07 MED ORDER — LEVOFLOXACIN 750 MG PO TABS
750.0000 mg | ORAL_TABLET | Freq: Every day | ORAL | Status: DC
  Administered 2013-02-07 – 2013-02-08 (×2): 750 mg via ORAL
  Filled 2013-02-07 (×2): qty 1

## 2013-02-07 NOTE — Progress Notes (Addendum)
ANTIBIOTIC CONSULT NOTE  Pharmacy Consult for Vancomycin, Levaquin, Zosyn Indication: rule out pneumonia  Allergies  Allergen Reactions  . Aspirin   . Ibuprofen Other (See Comments)    Can't take per Dr Jena Gauss- [facility] li[facility]  . Tylenol (Acetaminophen) Other (See Comments)    Causes legs to "run"   Patient Measurements: Height: 5\' 9"  (175.3 cm) Weight: 213 lb 3 oz (96.7 kg) IBW/kg (Calculated) : 70.7  Vital Signs: Temp: 98 F (36.7 C) (03/17 0800) Temp src: Oral (03/17 0800) BP: 138/76 mmHg (03/17 0700) Intake/Output from previous day: 03/16 0701 - 03/17 0700 In: 1213.8 [I.V.:313.8; IV Piggyback:900] Out: 550 [Urine:550] Intake/Output from this shift: Total I/O In: -  Out: 400 [Urine:400]  Labs:  Recent Labs  02/05/13 0450 02/06/13 0449 02/07/13 0431  WBC 5.5  --   --   HGB 10.6*  --   --   PLT 88*  --   --   CREATININE 0.76 0.69 0.64   Estimated Creatinine Clearance: 119.7 ml/min (by C-G formula based on Cr of 0.64).  Recent Labs  02/06/13 1917  VANCOTROUGH 13.2    Microbiology: Recent Results (from the past 720 hour(s))  MRSA PCR SCREENING     Status: None   Collection Time    02/04/13  1:30 AM      Result Value Range Status   MRSA by PCR NEGATIVE  NEGATIVE Final   Comment:            The GeneXpert MRSA Assay (FDA     approved for NASAL specimens     only), is one component of a     comprehensive MRSA colonization     surveillance program. It is not     intended to diagnose MRSA     infection nor to guide or     monitor treatment for     MRSA infections.  CULTURE, BLOOD (ROUTINE X 2)     Status: None   Collection Time    02/05/13  9:33 AM      Result Value Range Status   Specimen Description BLOOD RIGHT HAND   Final   Special Requests BOTTLES DRAWN AEROBIC AND ANAEROBIC 8  CC  EACH   Final   Culture NO GROWTH 2 DAYS   Final   Report Status PENDING   Incomplete  CULTURE, BLOOD (ROUTINE X 2)     Status: None   Collection Time    02/05/13   9:35 AM      Result Value Range Status   Specimen Description BLOOD LEFT HAND   Final   Special Requests BOTTLES DRAWN AEROBIC AND ANAEROBIC 8  CC  EACH   Final   Culture NO GROWTH 2 DAYS   Final   Report Status PENDING   Incomplete   Medical History: Past Medical History  Diagnosis Date  . DM (diabetes mellitus)   . Cirrhosis     bx proven steatohepatitis with cirrhosis (2010); per note from Feb 2011, received Hep A and B vaccines in 2010  . HTN (hypertension)   . RLS (restless legs syndrome)   . Sleep apnea   . Hyperlipidemia   . IDA (iron deficiency anemia)   . GERD (gastroesophageal reflux disease)   . Depression   . Peripheral neuropathy   . Urothelial cancer     2010, paillary low-grade, h/o recurrence 2011  . B12 deficiency   . Psoriasis   . 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 Anemia due to multiple  mechanisms 05/13/2011  . History of alcohol abuse 05/13/2011  . ANEMIA-IRON DEFICIENCY 03/09/2009  . S/P endoscopy May 2012    4 columns grade II esophageal varices; due for repeat in Nov 2013   . S/P colonoscopy May 2012    Tubular adenoma  . Low back pain   . Cancer     bladder ca 05/2009 removal and  w/chemo wash  . Esophageal varices with bleeding 11/24/11    s/p emergent EGD 11/25/11 by Dr. Rhea Belton at [facility], Grade III esophageal varices s/p banding X 5  . Small bowel obstruction 02/15/2012    Admitted to [facility], managed by Dr. Leticia Penna, ventral hernia manually reduced  . Ventral hernia 02/15/12  . MRSA (methicillin resistant Staphylococcus aureus)   . Acute post-ligation esophageal ulcer with hemorrhage 04/11/2012  . Sleep apnea   . Clostridium difficile infection   . Cirrhosis of liver without mention of alcohol     has received hep A/hep B vaccines, afp-03/26/11= <1.3, ct abd 06/25/12= cirrhosis and portal venous hypertension with hepatomegaly  . H. pylori infection 12/24/2012   Medications:  Scheduled:  . antiseptic oral rinse  15 mL Mouth Rinse QID  .  gabapentin  100 mg Oral QID  . insulin aspart  0-9 Units Subcutaneous TID WC  . insulin glargine  25 Units Subcutaneous BID  . lactulose  20 g Oral Daily  . levofloxacin (LEVAQUIN) IV  750 mg Intravenous Q24H  . piperacillin-tazobactam (ZOSYN)  IV  3.375 g Intravenous Q8H  . vancomycin  1,000 mg Intravenous Q8H  . [DISCONTINUED] insulin aspart  0-9 Units Subcutaneous Q4H  . [DISCONTINUED] insulin glargine  15 Units Subcutaneous BID   Assessment: Okay for Protocol Estimated Creatinine Clearance: 119.7 ml/min (by C-G formula based on Cr of 0.64). Treating for HCAP/Aspiration PNA.  Pt is now afebrile.   Trough level is on low end of goal but acceptable.  Goal of Therapy:  Vancomycin trough level 15-20 mcg/ml Eradicate infection.  Plan:  Zosyn 3.375gm IV every 8 hours. Levquin 750mg  PO every 24 hours. Vancomycin 1000mg  IV every 8 hours. Measure antibiotic drug levels at steady state Follow up culture results  Valrie Hart A 02/07/2013,10:37 AM

## 2013-02-07 NOTE — Progress Notes (Signed)
Patient ID: Kyle [name], male   DOB: 26-Sep-1956, 57 y.o.   MRN: [id number]  [facility] NEUROLOGY Dariella Gillihan A. Gerilyn Pilgrim, MD     [email or web address]          Kyle [name] is an 57 y.o. male.   Assessment/Plan: New-onset seizure. The event appears to have been unprovoked as far as we can tell this time. However, the patient does not meet criteria for epilepsy. He did have an EEG and this will be red. The MRI has also been done today and will be followed.  The patient is up walking around. He is awake and coherent. He has little memory of what happened over the last several days.    Objective: Vital signs in last 24 hours: Temp:  [97.8 F (36.6 C)-98 F (36.7 C)] 98 F (36.7 C) (03/17 1554) Pulse Rate:  [93] 93 (03/17 0800) Resp:  [18-25] 23 (03/17 1300) BP: (108-147)/(62-87) 137/79 mmHg (03/17 1300)  Intake/Output from previous day: 03/16 0701 - 03/17 0700 In: 1213.8 [I.V.:313.8; IV Piggyback:900] Out: 550 [Urine:550] Intake/Output this shift: Total I/O In: 910 [P.O.:480; I.V.:180; IV Piggyback:250] Out: 950 [Urine:950] Nutritional status: Full Liquid   Lab Results: Results for orders placed during the hospital encounter of 02/03/13 (from the past 48 hour(s))  GLUCOSE, CAPILLARY     Status: Abnormal   Collection Time    02/05/13  7:57 PM      Result Value Range   Glucose-Capillary 214 (*) 70 - 99 mg/dL   Comment 1 Documented in Chart     Comment 2 Notify RN    GLUCOSE, CAPILLARY     Status: Abnormal   Collection Time    02/06/13 12:17 AM      Result Value Range   Glucose-Capillary 178 (*) 70 - 99 mg/dL   Comment 1 Documented in Chart     Comment 2 Notify RN    GLUCOSE, CAPILLARY     Status: Abnormal   Collection Time    02/06/13  4:25 AM      Result Value Range   Glucose-Capillary 169 (*) 70 - 99 mg/dL   Comment 1 Documented in Chart     Comment 2 Notify RN    TRIGLYCERIDES     Status: None   Collection Time    02/06/13  4:49 AM      Result Value Range   Triglycerides 137  <150 mg/dL  BASIC METABOLIC PANEL     Status: Abnormal   Collection Time    02/06/13  4:49 AM      Result Value Range   Sodium 138  135 - 145 mEq/L   Potassium 3.7  3.5 - 5.1 mEq/L   Chloride 103  96 - 112 mEq/L   CO2 25  19 - 32 mEq/L   Glucose, Bld 189 (*) 70 - 99 mg/dL   BUN 6  6 - 23 mg/dL   Creatinine, Ser 2.95  0.50 - 1.35 mg/dL   Calcium 8.9  8.4 - 62.1 mg/dL   GFR calc non Af Amer >90  >90 mL/min   GFR calc Af Amer >90  >90 mL/min   Comment:            The eGFR has been calculated     using the CKD EPI equation.     This calculation has not been     validated in all clinical     situations.     eGFR's persistently     <90 mL/min signify  possible Chronic Kidney Disease.  GLUCOSE, CAPILLARY     Status: Abnormal   Collection Time    02/06/13  8:22 AM      Result Value Range   Glucose-Capillary 206 (*) 70 - 99 mg/dL   Comment 1 Documented in Chart     Comment 2 Notify RN    GLUCOSE, CAPILLARY     Status: Abnormal   Collection Time    02/06/13 11:46 AM      Result Value Range   Glucose-Capillary 262 (*) 70 - 99 mg/dL   Comment 1 Documented in Chart     Comment 2 Notify RN    GLUCOSE, CAPILLARY     Status: Abnormal   Collection Time    02/06/13  3:52 PM      Result Value Range   Glucose-Capillary 288 (*) 70 - 99 mg/dL  VANCOMYCIN, TROUGH     Status: None   Collection Time    02/06/13  7:17 PM      Result Value Range   Vancomycin Tr 13.2  10.0 - 20.0 ug/mL  GLUCOSE, CAPILLARY     Status: Abnormal   Collection Time    02/06/13  9:44 PM      Result Value Range   Glucose-Capillary 271 (*) 70 - 99 mg/dL   Comment 1 Documented in Chart     Comment 2 Notify RN    BASIC METABOLIC PANEL     Status: Abnormal   Collection Time    02/07/13  4:31 AM      Result Value Range   Sodium 136  135 - 145 mEq/L   Potassium 3.8  3.5 - 5.1 mEq/L   Chloride 100  96 - 112 mEq/L   CO2 24  19 - 32 mEq/L   Glucose, Bld 230 (*) 70 - 99 mg/dL   BUN 3 (*) 6 -  23 mg/dL   Creatinine, Ser 1.61  0.50 - 1.35 mg/dL   Calcium 9.3  8.4 - 09.6 mg/dL   GFR calc non Af Amer >90  >90 mL/min   GFR calc Af Amer >90  >90 mL/min   Comment:            The eGFR has been calculated     using the CKD EPI equation.     This calculation has not been     validated in all clinical     situations.     eGFR's persistently     <90 mL/min signify     possible Chronic Kidney Disease.  GLUCOSE, CAPILLARY     Status: Abnormal   Collection Time    02/07/13  7:33 AM      Result Value Range   Glucose-Capillary 254 (*) 70 - 99 mg/dL   Comment 1 Documented in Chart     Comment 2 Notify RN    GLUCOSE, CAPILLARY     Status: Abnormal   Collection Time    02/07/13 11:27 AM      Result Value Range   Glucose-Capillary 365 (*) 70 - 99 mg/dL   Comment 1 Documented in Chart     Comment 2 Notify RN    GLUCOSE, CAPILLARY     Status: Abnormal   Collection Time    02/07/13  3:39 PM      Result Value Range   Glucose-Capillary 330 (*) 70 - 99 mg/dL    Lipid Panel  Recent Labs  02/06/13 0449  TRIG 137    Studies/Results: Dg Ankle  Complete Right  02/06/2013  *RADIOLOGY REPORT*  Clinical Data: Ankle pain and swelling.  RIGHT ANKLE - COMPLETE 3+ VIEW  Comparison: None.  Findings: There is no fracture or dislocation.  There is a small ankle effusion and there are slight degenerative changes of the ankle joint with a tiny old avulsion from the tip of the medial malleolus.  Prominent plantar calcaneal spur.  IMPRESSION: No acute osseous abnormality.  Small ankle effusion.  Degenerative changes.   Original Report Authenticated By: Francene Boyers, M.D.    Mr Brain Wo Contrast  02/07/2013  *RADIOLOGY REPORT*  Clinical Data: New onset seizure.  Diabetes mellitus.  Iron deficiency anemia, cirrhosis, alcohol abuse, hypertension.  MRI HEAD WITHOUT CONTRAST  Technique:  Multiplanar, multiecho pulse sequences of the brain and surrounding structures were obtained according to standard  protocol without intravenous contrast.  Comparison: CT head 02/03/2013.  Findings: No acute stroke or intracranial hemorrhage.  No mass lesion or hydrocephalus. Prominent T1 and T2 shortening of the intracranial vasculature, pronounced on the T2* gradient sequences, a reflection of prior intravenous iron treatment.   Mild atrophy and chronic microvascular ischemic change. Flow voids maintained in the carotid and basilar arteries.  No worrisome osseous lesions. Calvarium intact.  No acute sinus or mastoid disease. No change from prior CT.  IMPRESSION: No acute intracranial findings.  Intravascular T1 and T2 shortening reflecting prior intravenous iron.  No intracranial mass lesion.   Original Report Authenticated By: Davonna Belling, M.D.     Medications:  Scheduled Meds: . antiseptic oral rinse  15 mL Mouth Rinse QID  . gabapentin  100 mg Oral QID  . insulin aspart  0-9 Units Subcutaneous TID WC  . insulin glargine  25 Units Subcutaneous BID  . lactulose  20 g Oral Daily  . levofloxacin  750 mg Oral Daily  . piperacillin-tazobactam (ZOSYN)  IV  3.375 g Intravenous Q8H  . vancomycin  1,000 mg Intravenous Q8H   Continuous Infusions: . 0.9 % NaCl with KCl 20 mEq / L 20 mL/hr at 02/07/13 1600   PRN Meds:.sodium chloride, acetaminophen, dextrose    LOS: 4 days   Yuma Pacella A. Gerilyn Pilgrim, M.D.  Diplomate, Biomedical engineer of Psychiatry and Neurology ( Neurology).

## 2013-02-07 NOTE — Progress Notes (Signed)
Pt identified for weight loss on the [facility] Nutrition Screen.   Wt Readings from Last 10 Encounters:  02/06/13 213 lb 3 oz (96.7 kg)  02/03/13 202 lb (91.627 kg)  12/25/12 267 lb 6.7 oz (121.3 kg)  12/25/12 267 lb 6.7 oz (121.3 kg)  09/28/12 234 lb 14.4 oz (106.55 kg)  09/03/12 236 lb (107.049 kg)  05/24/12 230 lb (104.327 kg)  05/24/12 230 lb (104.327 kg)  05/18/12 230 lb 9.6 oz (104.599 kg)  05/11/12 232 lb (105.235 kg)   Chart reviewed. Pt followed by heme/onc for anemia. Pt also with hx of bladder CA in 2010 and cirrhosis. Pt s/p TIPS procedure on 01/17/13 at [facility]. Pt required extensive hospital stay at [facility]. Noted that pt is currently admitted to [facility] for seizure. Pt remains in ICU; was extubated on 02/05/13.  Wt hx reveals UBW of 234#. Pt with a 25# (10.5%) wt loss x 13 months, a 19# (8.1%) wt loss x 9 months, 23# (9.7%) wt loss x 5 months, and a 21# (8.9%) wt gain x 3 days. Weight loss not clinically significant and could be partly related to fluid loss from cirrhosis. Wt gain could also be due to fluid retention from mechanical ventilation.   Pt is at risk for malnutrition given hx of weight loss and recent hospitalizations and procedures. Pt may benefit from oral nutrition supplement. Will continue to follow.   Melody Haver, RD, LDN Pager: [phone]

## 2013-02-07 NOTE — Progress Notes (Signed)
Inpatient Diabetes Program Recommendations  AACE/ADA: New Consensus Statement on Inpatient Glycemic Control (2013)  Target Ranges:  Prepandial:   less than 140 mg/dL      Peak postprandial:   less than 180 mg/dL (1-2 hours)      Critically ill patients:  140 - 180 mg/dL    Results for Kyle [name], Kyle [name] (MRN [id number]) as of 02/07/2013 07:55  Ref. Range 02/06/2013 08:22 02/06/2013 11:46 02/06/2013 15:52 02/06/2013 21:44  Glucose-Capillary Latest Range: 70-99 mg/dL 409 (H) 811 (H) 914 (H) 271 (H)   Inpatient Diabetes Program Recommendations Insulin - Basal: Please conisder increasing Lantus to 25 units BID. Correction (SSI): Please consider increasing Novolog correction to moderate scale.  Also, please consider adding bedtime Novolog correction.   Note: Patient has a history of diabetes and takes Lantus 50 units BID and Metformin 1000 mg BID at home for diabetes management.  Currently, patient is ordered to receive Lantus 15 units BID and Novolog sensitive correction before meals TID for inpatient glycemic control.  Blood glucose has ranged from 206-288 mg/dl over the past 24 hours.  Fasting lab glucose this morning is 230 mg/dl.  Please consider increasing Lantus to 25 units BID, increasing Novolog correction to moderate scale and add bedtime correction.  Will continue to follow.  Thanks, Orlando Penner, RN, BSN, CCRN Diabetes Coordinator Inpatient Diabetes Program [phone]

## 2013-02-07 NOTE — Procedures (Signed)
NAME:  TORIE, PRIEBE NO.:  [id number]  MEDICAL RECORD NO.:  [id number]  LOCATION:  A319                          FACILITY:  [facility]  PHYSICIAN:  Lindbergh Winkles A. Gerilyn Pilgrim, M.D. DATE OF BIRTH:  02/04/1956  DATE OF PROCEDURE:  02/04/2013 DATE OF DISCHARGE:                             EEG INTERPRETATION   INDICATION:  This is a 57 year old man, who presents with new-onset seizures.  MEDICATION:  Propofol, insulin, Protonix.  ANALYSIS:  A 16 channel recording using standard 10/20 measurement is conducted for 21 minutes.  The background activity gets as high as 7 Hz. However, there is extensive amount of delta and also theta slowing seen repeatedly throughout the recording.  There was also significant amount of sleep activity seen with excessive spindling and K-complexes observed.  There is no focal or lateralized slowing.  Photic stimulation and hyperventilation were not carried out.  There was no epileptiform activity observed.  IMPRESSION:  Moderate-to-mild generalized slowing, however, no epileptiform activity observed.     Jessic Standifer A. Gerilyn Pilgrim, M.D.     KAD/MEDQ  D:  02/07/2013  T:  02/07/2013  Job:  [id number]

## 2013-02-07 NOTE — Evaluation (Signed)
Physical Therapy Evaluation Patient Details Name: Kyle [name] MRN: [id number] DOB: [birth year] Today's Date: 02/07/2013 Time: 0820-0900 PT Time Calculation (min): 40 min  PT Assessment / Plan / Recommendation Clinical Impression  Pt is a 57 yo maile with decreased strength on his R side.  Pt has increased pain with full weight on R ankle but able to ambulate with walker without increased pain.  Pt will be seen to improve strength and determine least restrrictive assistive device; assess if patient is able to ambulate with a cane without aggrevating ankle.Kyle [name]    PT Assessment  Patient needs continued PT services    Follow Up Recommendations  Outpatient PT    Does the patient have the potential to tolerate intense rehabilitation    no  Barriers to Discharge  none      Equipment Recommendations  None recommended by PT    Recommendations for Other Services   none  Frequency Min 3X/week    Precautions / Restrictions Precautions Precautions: None Restrictions Weight Bearing Restrictions: No   Pertinent Vitals/Pain 4/10      Mobility  Bed Mobility Bed Mobility: Supine to Sit Supine to Sit: 6: Modified independent (Device/Increase time) Transfers Transfers: Sit to Stand Sit to Stand: 6: Modified independent (Device/Increase time) Ambulation/Gait Ambulation/Gait Assistance: 6: Modified independent (Device/Increase time) Ambulation Distance (Feet): 150 Feet Assistive device: Rolling walker Gait Pattern: Decreased step length - right;Decreased step length - left Gait velocity: slow    Exercises General Exercises - Lower Extremity Ankle Circles/Pumps: AROM;Both;10 reps Gluteal Sets: 10 reps (bridging) Hip ABduction/ADduction: Strengthening;Both;Sidelying;10 reps Straight Leg Raises: Strengthening;Both;10 reps Heel Raises:  (attempted to painful for R ankle) Mini-Sqauts: Strengthening;5 reps;Standing   PT Diagnosis: Difficulty walking;Hemiplegia dominant side  PT Problem  List: Decreased strength;Decreased activity tolerance;Pain PT Treatment Interventions: Gait training;Stair training;Therapeutic exercise   PT Goals Acute Rehab PT Goals PT Goal Formulation: With patient Potential to Achieve Goals: Good Pt will go Sit to Supine/Side: Independently PT Goal: Sit to Supine/Side - Progress: Goal set today Pt will go Sit to Stand: Independently PT Goal: Sit to Stand - Progress: Goal set today Pt will go Stand to Sit: Independently PT Goal: Stand to Sit - Progress: Goal set today Pt will Ambulate: >150 feet;with least restrictive assistive device PT Goal: Ambulate - Progress: Goal set today Pt will Go Up / Down Stairs: 3-5 stairs;with modified independence PT Goal: Up/Down Stairs - Progress: Goal set today  Visit Information  Last PT Received On: 02/07/13    Subjective Data  Subjective: Pt states he spent 21 days at [facility]  after he had liver failure for a TIPS procedure.  Pt was weak when he went home using a walker to begin with but then progressed to a cane.  The patient had a seizure and was admitted to [facility] and is know bering refterred to PT Patient Stated Goal: To go home   Prior Functioning  Home Living Lives With: Spouse Available Help at Discharge: Family Type of Home: House Home Access: Stairs to enter Secretary/administrator of Steps: 3 Entrance Stairs-Rails: Right Home Layout: One level Firefighter: Standard Home Adaptive Equipment: None Prior Function Level of Independence: Independent with assistive device(s) Vocation: Unemployed Communication Communication: No difficulties    Cognition  Cognition Overall Cognitive Status: Appears within functional limits for tasks assessed/performed Arousal/Alertness: Awake/alert Behavior During Session: [facility] for tasks performed    Extremity/Trunk Assessment Right Lower Extremity Assessment RLE ROM/Strength/Tone: Deficits RLE ROM/Strength/Tone Deficits: hip strength 3/5;  knee  4/5  ankle 3+/5 Left Lower Extremity Assessment LLE ROM/Strength/Tone: WFL for tasks assessed       End of Session PT - End of Session Equipment Utilized During Treatment: Gait belt Activity Tolerance: Patient tolerated treatment well Patient left: in chair;with call bell/phone within reach;with chair alarm set;with family/visitor present  GP     RUSSELL,CINDY 02/07/2013, 9:16 AM

## 2013-02-07 NOTE — Progress Notes (Signed)
Report given to Karl Luke, RN.  Patient ready for transfer to room 319. No acute distress noted.  Out via wheelchair to room 319

## 2013-02-07 NOTE — Progress Notes (Signed)
Subjective: This man is much improved since I admitted him a few days ago. He is off the ventilator and alert and orientated. I took the history once again he remembers becoming stiff and then does not remember anything else. I assume this was a seizure. He categorically denies abusing any kind of drugs such as marijuana or cocaine or barbiturates. Urine drug screen, although not on admission, was negative except for benzodiazepines, which have been given to him since admission. He is going for MRI brain scan today.           Physical Exam: Blood pressure 138/76, pulse 94, temperature 98 F (36.7 C), temperature source Oral, resp. rate 19, height 5\' 9"  (1.753 m), weight 96.7 kg (213 lb 3 oz), SpO2 100.00%. He looks systemically well. He is alert and oriented. Heart sounds are present and normal. There are no murmurs or gallop rhythm. Lung fields show scattered crackles.   Investigations:  Recent Results (from the past 240 hour(s))  MRSA PCR SCREENING     Status: None   Collection Time    02/04/13  1:30 AM      Result Value Range Status   MRSA by PCR NEGATIVE  NEGATIVE Final   Comment:            The GeneXpert MRSA Assay (FDA     approved for NASAL specimens     only), is one component of a     comprehensive MRSA colonization     surveillance program. It is not     intended to diagnose MRSA     infection nor to guide or     monitor treatment for     MRSA infections.  CULTURE, BLOOD (ROUTINE X 2)     Status: None   Collection Time    02/05/13  9:33 AM      Result Value Range Status   Specimen Description BLOOD RIGHT HAND   Final   Special Requests BOTTLES DRAWN AEROBIC AND ANAEROBIC 8  CC  EACH   Final   Culture NO GROWTH 1 DAY   Final   Report Status PENDING   Incomplete  CULTURE, BLOOD (ROUTINE X 2)     Status: None   Collection Time    02/05/13  9:35 AM      Result Value Range Status   Specimen Description BLOOD LEFT HAND   Final   Special Requests BOTTLES  DRAWN AEROBIC AND ANAEROBIC 8  CC  EACH   Final   Culture NO GROWTH 1 DAY   Final   Report Status PENDING   Incomplete     Basic Metabolic Panel:  Recent Labs  40/98/11 0449 02/07/13 0431  NA 138 136  K 3.7 3.8  CL 103 100  CO2 25 24  GLUCOSE 189* 230*  BUN 6 3*  CREATININE 0.69 0.64  CALCIUM 8.9 9.3       CBC:  Recent Labs  02/05/13 0450  WBC 5.5  HGB 10.6*  HCT 32.7*  MCV 84.5  PLT 88*    Dg Ankle Complete Right  02/06/2013  *RADIOLOGY REPORT*  Clinical Data: Ankle pain and swelling.  RIGHT ANKLE - COMPLETE 3+ VIEW  Comparison: None.  Findings: There is no fracture or dislocation.  There is a small ankle effusion and there are slight degenerative changes of the ankle joint with a tiny old avulsion from the tip of the medial malleolus.  Prominent plantar calcaneal spur.  IMPRESSION: No acute osseous  abnormality.  Small ankle effusion.  Degenerative changes.   Original Report Authenticated By: Francene Boyers, M.D.    Dg Chest Port 1 View  02/05/2013  *RADIOLOGY REPORT*  Clinical Data: Fever.  Seizure on ventilator.  Rule out infiltrate.  PORTABLE CHEST - 1 VIEW  Comparison: 03/13  Findings: Film is made with shallow lung inflation.  The patient has been extubated.  Heart size is mildly enlarged.  There is prominence of interstitial markings and pulmonary vascular congestion.  There are no focal consolidations or overt edema.  IMPRESSION:  1.  Status post extubation. 2.  Mild cardiomegaly and pulmonary vascular congestion.   Original Report Authenticated By: Norva Pavlov, M.D.       Medications: I have reviewed the patient's current medications.  Impression: 1. Seizure, unclear etiology 2. Liver disease, status post TIPS procedure approximately 4 weeks ago. 3. Type 2 diabetes mellitus, uncontrolled. 4. Multi-focal pneumonia. 5. History of alcohol abuse. 6. Hypertension. 7. Deconditioning.     Plan: 1. Continue with antibiotics. 2. Await MRI brain  scan. 3. Physical therapy. 4. Patient can transfer to the medical floor.     LOS: 4 days   Wilson Singer Pager [phone]  02/07/2013, 8:31 AM

## 2013-02-07 NOTE — Progress Notes (Signed)
PHARMACIST - PHYSICIAN COMMUNICATION DR:   Karilyn Cota CONCERNING: Antibiotic IV to Oral Route Change Policy  RECOMMENDATION: This patient is receiving Levaquin by the intravenous route.  Based on criteria approved by the Pharmacy and Therapeutics Committee, the antibiotic(s) is/are being converted to the equivalent oral dose form(s).  DESCRIPTION: These criteria include:  Patient being treated for a respiratory tract infection, urinary tract infection, or cellulitis  The patient is not neutropenic and does not exhibit a GI malabsorption state  The patient is eating (either orally or via tube) and/or has been taking other orally administered medications for a least 24 hours  The patient is improving clinically and has a Tmax < 100.5  If you have questions about this conversion, please contact the Pharmacy Department  [x]   [phone] )  [facility] []   [phone] )  [facility]  []   [phone] )  [facility] []   [phone] )  [facility]   S. Margo Aye, PharmD

## 2013-02-08 ENCOUNTER — Encounter (HOSPITAL_COMMUNITY): Payer: Self-pay | Admitting: Cardiology

## 2013-02-08 ENCOUNTER — Ambulatory Visit: Payer: Medicaid Other | Admitting: Urgent Care

## 2013-02-08 LAB — CBC
HCT: 32.6 % — ABNORMAL LOW (ref 39.0–52.0)
Hemoglobin: 10.7 g/dL — ABNORMAL LOW (ref 13.0–17.0)
MCH: 27.5 pg (ref 26.0–34.0)
MCHC: 32.8 g/dL (ref 30.0–36.0)
MCV: 83.8 fL (ref 78.0–100.0)

## 2013-02-08 LAB — COMPREHENSIVE METABOLIC PANEL
Alkaline Phosphatase: 121 U/L — ABNORMAL HIGH (ref 39–117)
BUN: 3 mg/dL — ABNORMAL LOW (ref 6–23)
GFR calc Af Amer: >90 mL/min (ref >90)
Glucose, Bld: 353 mg/dL — ABNORMAL HIGH (ref 70–99)
Potassium: 3.7 mEq/L (ref 3.5–5.1)
Total Protein: 6.7 g/dL (ref 6.0–8.3)

## 2013-02-08 LAB — PROTIME-INR: Prothrombin Time: 16.1 seconds — ABNORMAL HIGH (ref 11.6–15.2)

## 2013-02-08 LAB — GLUCOSE, CAPILLARY

## 2013-02-08 MED ORDER — LEVOFLOXACIN 750 MG PO TABS: 750.0000 mg | ORAL_TABLET | Freq: Every day | ORAL | Status: DC

## 2013-02-08 MED ORDER — INSULIN GLARGINE 100 UNIT/ML ~~LOC~~ SOLN
40.0000 [IU] | Freq: Two times a day (BID) | SUBCUTANEOUS | Status: DC
  Administered 2013-02-08: 40 [IU] via SUBCUTANEOUS
  Filled 2013-02-08: qty 3

## 2013-02-08 NOTE — Progress Notes (Signed)
Discharge instructions and prescription given, verbalized understanding, out in stable condition via w/c with staff. 

## 2013-02-08 NOTE — Discharge Summary (Addendum)
Physician Discharge Summary  Kyle [name] ZOX:[id number] DOB: Jun 01, 1956 DOA: 02/03/2013  PCP: Redmond Baseman, MD  Admit date: 02/03/2013 Discharge date: 02/08/2013  Time spent: Greater than 30 minutes  Recommendations for Outpatient Follow-up:  1. Followup with primary care physician within a week. 2. Followup with endocrinologist in a couple of weeks.  Discharge Diagnoses:  1. Seizure, new onset, likely related to severely uncontrolled diabetes with hyperglycemia. No DKA. EEG unremarkable. No evidence of CVA on MRI brain scan. 2. 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 3. 2 diabetes mellitus, severely uncontrolled. 4. Multifocal pneumonia, likely aspiration. 5. Hypertension. 6. Deconditioning. 7. Pancytopenia, multifactorial.   Discharge Condition: Stable and improving.  Diet recommendation: Carbohydrate modified diet.  Filed Weights   02/04/13 0500 02/05/13 0500 02/06/13 0500  Weight: 90.4 kg (199 lb 4.7 oz) 93 kg (205 lb 0.4 oz) 96.7 kg (213 lb 3 oz)    History of present illness:  This 57 year old man presents to the hospital with symptoms of seizure. Please see initial history as outlined below: HPI: Kyle [name] is a 57 y.o. male who was in his usual state of health until approximately 5 hours ago when he had a sudden seizure witnessed by his wife. She describes a tonic-clonic event with unconsciousness. When he presented to the emergency room he was very combative and had to be sedated severely, it was felt that his airway was compromised and he was intubated and ventilated thereafter. He had a TIPS procedure for liver failure approximately 3 weeks ago at [facility]. He has multifactorial anemia and has recently received IV iron, which he has been receiving a monthly basis. He is diabetic. 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 He has had multiple episodes of upper GI bleeding secondary to  esophageal varices. On his recent visit episode he had to be transferred to [facility] whereupon he received TIPS procedure.  Hospital Course:  Patient was, in the emergency room, is very combative and there was concern over his airway. Therefore he was intubated and mechanically ventilated and sedated. Multifocal pneumonia was found on a CT scan of his chest. He was started on antibiotics. It was noted that his diabetes was severely uncontrolled with an admission glucose of 567. He was not in DKA and did not appear specifically in hyperosmolar coma. Patient was supported on the ventilator and eventually liberated from it. He did well post extubation. He was seen by neurology, Dr Gerilyn Pilgrim and pulmonology Dr. Juanetta Gosling. EEG was unremarkable. MRI brain scan did not show any evidence of new CVA. He appears to be back to his baseline now. Initially, it was felt that his right arm was weak but now it is back to his usual strength. He was evaluated by physical therapy, who recommended outpatient physical therapy. He is now stable for discharge but the main issue will be controlling his diabetes much tighter. We will try and arrange for him to have a appointment soon with his endocrinologist. In the meantime, he is to see his primary care physician within a week.  Procedures:  None.   Consultations:  Neurology, Dr Gerilyn Pilgrim.  Pulmonology, Dr. Juanetta Gosling.  Discharge Exam: Filed Vitals:   02/07/13 1300 02/07/13 1554 02/07/13 2118 02/08/13 0412  BP: 137/79  107/65 106/67  Pulse:   105 97  Temp:  98 F (36.7 C) 98.2 F (36.8 C) 98.6 F (37 C)  TempSrc:  Oral Oral Oral  Resp: 23  22 22  Height:      Weight:      SpO2:   95% 96%    General: Looks systemically well. He is alert and orientated. There is no signs of hepatic encephalopathy at all. He is not jaundiced. Cardiovascular: Heart sounds are present and in sinus rhythm. There are no murmurs or added sounds. Respiratory: Lung fields are  clinically clear. There are no crackles, bronchial breathing no wheezing.  Discharge Instructions  Discharge Orders   Future Appointments Provider Department Dept Phone   03/17/2013 10:00 AM [facility] [phone]   05/05/2013 10:00 AM [facility] [phone]   05/06/2013 10:30 AM Maurine Minister Evlyn Courier [facility] [phone]   06/16/2013 9:50 AM [facility] [phone]   Future Orders Complete By Expires     Diet - low sodium heart healthy  As directed     Increase activity slowly  As directed         Medication List    TAKE these medications       carvedilol 6.25 MG tablet  Commonly known as:  COREG  Take 6.25 mg by mouth 2 (two) times daily with a meal.     furosemide 20 MG tablet  Commonly known as:  LASIX  Take 20 mg by mouth daily.     gabapentin 300 MG capsule  Commonly known as:  NEURONTIN  Take 300-900 mg by mouth 4 (four) times daily.     insulin glargine 100 UNIT/ML injection  Commonly known as:  LANTUS  Inject 50 Units into the skin 2 (two) times daily. Per sliding scale     lactulose 10 GM/15ML solution  Commonly known as:  CHRONULAC  Take 20 g by mouth daily.     levofloxacin 750 MG tablet  Commonly known as:  LEVAQUIN  Take 1 tablet (750 mg total) by mouth daily.     metFORMIN 1000 MG tablet  Commonly known as:  GLUCOPHAGE  Take 1,000 mg by mouth 2 (two) times daily with a meal.     omeprazole 40 MG capsule  Commonly known as:  PRILOSEC  Take 1 capsule (40 mg total) by mouth daily.     zolpidem 5 MG tablet  Commonly known as:  AMBIEN  Take 1-2 tablets (5-10 mg total) by mouth at bedtime as needed for sleep.          The results of significant diagnostics from this hospitalization (including imaging, microbiology, ancillary and laboratory) are listed below for reference.    Significant Diagnostic Studies: Dg Ankle Complete Right  02/06/2013   *RADIOLOGY REPORT*  Clinical Data: Ankle pain and swelling.  RIGHT ANKLE - COMPLETE 3+ VIEW  Comparison: None.  Findings: There is no fracture or dislocation.  There is a small ankle effusion and there are slight degenerative changes of the ankle joint with a tiny old avulsion from the tip of the medial malleolus.  Prominent plantar calcaneal spur.  IMPRESSION: No acute osseous abnormality.  Small ankle effusion.  Degenerative changes.   Original Report Authenticated By: Francene Boyers, M.D.    Ct Head Wo Contrast  02/03/2013  *RADIOLOGY REPORT*  Clinical Data: Altered mental status, combative, history diabetes, hypertension, bladder cancer  CT HEAD WITHOUT CONTRAST  Technique:  Contiguous axial images were obtained from the base of the skull through the vertex without contrast.  Comparison: 07/20/2008  Findings: Normal ventricular morphology. No midline shift or mass  effect. Normal appearance of brain parenchyma. No intracranial hemorrhage, mass lesion or evidence of acute infarction. No extra-axial fluid collections. Bones and sinuses unremarkable.  IMPRESSION: No acute intracranial abnormalities.   Original Report Authenticated By: Ulyses Southward, M.D.    Mr Brain Wo Contrast  02/07/2013  *RADIOLOGY REPORT*  Clinical Data: New onset seizure.  Diabetes mellitus.  Iron deficiency anemia, cirrhosis, alcohol abuse, hypertension.  MRI HEAD WITHOUT CONTRAST  Technique:  Multiplanar, multiecho pulse sequences of the brain and surrounding structures were obtained according to standard protocol without intravenous contrast.  Comparison: CT head 02/03/2013.  Findings: No acute stroke or intracranial hemorrhage.  No mass lesion or hydrocephalus. Prominent T1 and T2 shortening of the intracranial vasculature, pronounced on the T2* gradient sequences, a reflection of prior intravenous iron treatment.   Mild atrophy and chronic microvascular ischemic change. Flow voids maintained in the carotid and basilar arteries.  No  worrisome osseous lesions. Calvarium intact.  No acute sinus or mastoid disease. No change from prior CT.  IMPRESSION: No acute intracranial findings.  Intravascular T1 and T2 shortening reflecting prior intravenous iron.  No intracranial mass lesion.   Original Report Authenticated By: Davonna Belling, M.D.    Dg Chest Port 1 View  02/05/2013  *RADIOLOGY REPORT*  Clinical Data: Fever.  Seizure on ventilator.  Rule out infiltrate.  PORTABLE CHEST - 1 VIEW  Comparison: 03/13  Findings: Film is made with shallow lung inflation.  The patient has been extubated.  Heart size is mildly enlarged.  There is prominence of interstitial markings and pulmonary vascular congestion.  There are no focal consolidations or overt edema.  IMPRESSION:  1.  Status post extubation. 2.  Mild cardiomegaly and pulmonary vascular congestion.   Original Report Authenticated By: Norva Pavlov, M.D.    Dg Chest Portable 1 View  02/03/2013  *RADIOLOGY REPORT*  Clinical Data: Seizures  PORTABLE CHEST - 1 VIEW  Comparison: Portable exam 2007 hours compared to 12/25/2012  Findings: Tip of endotracheal tube 3.6 cm above carina. Upper normal heart size. Prominent mediastinum unchanged, at least in part related to technique. Pulmonary vascular congestion. Patchy perihilar infiltrates on left similar to previous exam. Aeration in right lung has improved since previous study. Minimal right basilar atelectasis noted. No gross pleural effusion or pneumothorax.  IMPRESSION: Satisfactory endotracheal tube position. Persistent mild patchy left perihilar infiltrates. Minimal right basilar atelectasis.   Original Report Authenticated By: Ulyses Southward, M.D.     Microbiology: Recent Results (from the past 240 hour(s))  MRSA PCR SCREENING     Status: None   Collection Time    02/04/13  1:30 AM      Result Value Range Status   MRSA by PCR NEGATIVE  NEGATIVE Final   Comment:            The GeneXpert MRSA Assay (FDA     approved for NASAL specimens      only), is one component of a     comprehensive MRSA colonization     surveillance program. It is not     intended to diagnose MRSA     infection nor to guide or     monitor treatment for     MRSA infections.  CULTURE, BLOOD (ROUTINE X 2)     Status: None   Collection Time    02/05/13  9:33 AM      Result Value Range Status   Specimen Description BLOOD RIGHT HAND   Final   Special Requests BOTTLES DRAWN  AEROBIC AND ANAEROBIC 8  CC  EACH   Final   Culture NO GROWTH 2 DAYS   Final   Report Status PENDING   Incomplete  CULTURE, BLOOD (ROUTINE X 2)     Status: None   Collection Time    02/05/13  9:35 AM      Result Value Range Status   Specimen Description BLOOD LEFT HAND   Final   Special Requests BOTTLES DRAWN AEROBIC AND ANAEROBIC 8  CC  EACH   Final   Culture NO GROWTH 2 DAYS   Final   Report Status PENDING   Incomplete     Labs: Basic Metabolic Panel:  Recent Labs Lab 02/04/13 0441 02/05/13 0450 02/06/13 0449 02/07/13 0431 02/08/13 0509  NA 140 138 138 136 136  K 3.3* 3.6 3.7 3.8 3.7  CL 104 104 103 100 99  CO2 24 23 25 24 25   GLUCOSE 140* 187* 189* 230* 353*  BUN 13 11 6  3* 3*  CREATININE 0.77 0.76 0.69 0.64 0.68  CALCIUM 9.8 9.3 8.9 9.3 9.4   Liver Function Tests:  Recent Labs Lab 02/03/13 1920 02/04/13 0441 02/08/13 0509  AST 28 27 47*  ALT 27 24 32  ALKPHOS 184* 122* 121*  BILITOT 1.0 0.9 1.4*  PROT 7.6 7.0 6.7  ALBUMIN 3.1* 2.8* 2.5*    Recent Labs Lab 02/03/13 1920  LIPASE 87*    Recent Labs Lab 02/04/13 0441 02/05/13 0450  AMMONIA 64* 55   CBC:  Recent Labs Lab 02/01/13 1001 02/03/13 1935 02/04/13 0441 02/05/13 0450 02/08/13 0509  WBC 4.6  --  6.0 5.5 3.3*  NEUTROABS 3.0  --   --   --   --   HGB 11.7* 13.3 10.8* 10.6* 10.7*  HCT 35.7* 39.0 32.4* 32.7* 32.6*  MCV 83.6  --  82.2 84.5 83.8  PLT 98*  --  92* 88* 78*   Cardiac Enzymes:  Recent Labs Lab 02/03/13 2126  CKTOTAL 147   BNP: BNP (last 3 results)  Recent  Labs  12/25/12 0818  PROBNP <5.0   CBG:  Recent Labs Lab 02/06/13 2144 02/07/13 0733 02/07/13 1127 02/07/13 1539 02/08/13 0809  GLUCAP 271* 254* 365* 330* 258*       Signed:  GOSRANI,NIMISH C  [facility] 02/08/2013, 8:28 AM

## 2013-02-10 LAB — CULTURE, BLOOD (ROUTINE X 2)
Culture: NO GROWTH
Culture: NO GROWTH

## 2013-02-10 NOTE — Telephone Encounter (Signed)
Pt had OV w/ me scheduled this week, but it was rescheduled w/ Dr Jena Gauss for next week.

## 2013-02-11 NOTE — Telephone Encounter (Signed)
Please note he was at [facility] for 1 month - TIPS done there

## 2013-02-11 NOTE — Telephone Encounter (Signed)
Corrected

## 2013-02-16 ENCOUNTER — Other Ambulatory Visit: Payer: Self-pay

## 2013-02-16 ENCOUNTER — Ambulatory Visit (INDEPENDENT_AMBULATORY_CARE_PROVIDER_SITE_OTHER): Payer: Medicaid Other | Admitting: Internal Medicine

## 2013-02-16 ENCOUNTER — Encounter: Payer: Self-pay | Admitting: Internal Medicine

## 2013-02-16 VITALS — BP 130/81 | HR 81 | Temp 98.0°F | Ht 66.0 in | Wt 207.2 lb

## 2013-02-16 DIAGNOSIS — Z9889 Other specified postprocedural states: Secondary | ICD-10-CM

## 2013-02-16 DIAGNOSIS — K7581 Nonalcoholic steatohepatitis (NASH): Secondary | ICD-10-CM

## 2013-02-16 DIAGNOSIS — K7460 Unspecified cirrhosis of liver: Secondary | ICD-10-CM

## 2013-02-16 DIAGNOSIS — Z95828 Presence of other vascular implants and grafts: Secondary | ICD-10-CM

## 2013-02-16 LAB — COMPREHENSIVE METABOLIC PANEL
ALT: 35 U/L (ref 0–53)
Albumin: 2.8 g/dL — ABNORMAL LOW (ref 3.5–5.2)
CO2: 25 mEq/L (ref 19–32)
Calcium: 9.1 mg/dL (ref 8.4–10.5)
Chloride: 101 mEq/L (ref 96–112)
Sodium: 135 mEq/L (ref 135–145)
Total Protein: 6.5 g/dL (ref 6.0–8.3)

## 2013-02-16 LAB — CBC WITH DIFFERENTIAL/PLATELET
Basophils Relative: 0 % (ref 0–1)
Eosinophils Absolute: 0.1 10*3/uL (ref 0.0–0.7)
MCH: 28.4 pg (ref 26.0–34.0)
MCHC: 33 g/dL (ref 30.0–36.0)
Monocytes Relative: 8 % (ref 3–12)
Neutrophils Relative %: 57 % (ref 43–77)
Platelets: 82 10*3/uL — ABNORMAL LOW (ref 150–400)
RDW: 19.1 % — ABNORMAL HIGH (ref 11.5–15.5)

## 2013-02-16 LAB — PROTIME-INR: Prothrombin Time: 15.7 seconds — ABNORMAL HIGH (ref 11.6–15.2)

## 2013-02-16 LAB — AMMONIA: Ammonia: 91 umol/L — ABNORMAL HIGH (ref 11–60)

## 2013-02-16 NOTE — Patient Instructions (Signed)
Chem-12, !NR normal, CBC, ammonia  Doppler flow ultrasound of liver/ TIPS  Further recommendations to follow

## 2013-02-16 NOTE — Progress Notes (Signed)
Primary Care Physician:  Redmond Baseman, MD Primary Gastroenterologist:  Dr. Jena Gauss  Pre-Procedure History & Physical: HPI:  Kyle [name] is a 57 y.o. male here for [name]/cirrhosis. He presented in January of this year with a torrential variceal bleed. He underwent banding and sclerotherapy by me at this facility. He was intubated in the ED runoff to back. Apparently he had a helmet aspiration pneumonia and ARDS. He spent 3 weeks intubated at [facility] and ultimately underwent a TIPS. Post TIPS course concha by diabetic, in part related to lactulose. He spent 6 days and [facility]. Now doing well on lactulose 30 cc twice a day. Tolerating this regimen well he's lost about 30 pounds since last fall. He's come off a number of his medications including diuretics. He is not on a nonselective selective beta blocker.  He and his wife are here today. There. Please with this progress. Apparently, there's been no issues with encephalopathy. He needs labs and Doppler flow studies of his TIPS.  Blood sugars have been more difficult to control since lactulose started.   Past Medical History  Diagnosis Date  . DM (diabetes mellitus)   . Cirrhosis     bx proven steatohepatitis with cirrhosis (2010); per note from Feb 2011, received Hep A and B vaccines in 2010  . HTN (hypertension)   . RLS (restless legs syndrome)   . Sleep apnea   . Hyperlipidemia   . IDA (iron deficiency anemia)   . GERD (gastroesophageal reflux disease)   . Depression   . Peripheral neuropathy   . Urothelial cancer     2010, paillary low-grade, h/o recurrence 2011  . B12 deficiency   . Psoriasis   . 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 Anemia due to multiple mechanisms 05/13/2011  . History of alcohol abuse 05/13/2011  . ANEMIA-IRON DEFICIENCY 03/09/2009  . S/P endoscopy May 2012    4 columns grade II esophageal varices; due for repeat in Nov 2013   . S/P colonoscopy May 2012    Tubular adenoma  . Low  back pain   . Cancer     bladder ca 05/2009 removal and  w/chemo wash  . Esophageal varices with bleeding 11/24/11    s/p emergent EGD 11/25/11 by Dr. Rhea Belton at [facility], Grade III esophageal varices s/p banding X 5  . Small bowel obstruction 02/15/2012    Admitted to [facility], managed by Dr. Leticia Penna, ventral hernia manually reduced  . Ventral hernia 02/15/12  . MRSA (methicillin resistant Staphylococcus aureus)   . Acute post-ligation esophageal ulcer with hemorrhage 04/11/2012  . Sleep apnea   . Clostridium difficile infection   . Cirrhosis of liver without mention of alcohol     has received hep A/hep B vaccines, afp-03/26/11= <1.3, ct abd 06/25/12= cirrhosis and portal venous hypertension with hepatomegaly  . H. pylori infection 12/24/2012    Past Surgical History  Procedure Laterality Date  . Carpel tunnel    . Shoulder surgery    . Adrenal mass surgery  05/2009    benign, left  . Bladder surgery  01/2009 and 06/2010    cancer 2010, small recurrence in 06/2010  . Colonoscopy  04/2009    moderate int hemorrhoids, rare sigmoid diverticula, one mm sessile hyperplastic rectal polyp  . Esophagogastroduodenoscopy  03/2009    small hh  . Small bowel capsule endoscopy  03/2009    couple of small benign appearing erosions, nonbleeding  . Egd/tcs  08/2007    small hiatal  hernia, pancolonic diverticula, friable anal canal, 3cm salmon colored epithelium in distal esophagus, bx negative for Barrett's  . Skin cancer excision  Oct 2012    left arm  . Esophagogastroduodenoscopy  11/25/2011    Dr. Vedia Coffer III varices in the mid and distal esophagus, banding placed, portal astropathy  . Esophagogastroduodenoscopy  01/14/2012    Dr Dajah Fischman->4-5 columns Gr2 varices, 6 bands placed, HH, distal esophageal ulcer, portal gastropathy, antral erosions  . Esophagogastroduodenoscopy  03/17/2012    Dr. Rito Ehrlich varices status post band ligation. Hiatal hernia. Portal gastropathy.  . Umbilical hernia repair  04/05/2012     Procedure: HERNIA REPAIR UMBILICAL ADULT;  Surgeon: Fabio Bering, MD;  Location: [facility]S;  Service: General;  Laterality: N/A;  . Esophagogastroduodenoscopy  04/11/2012    Dr. Lysle Pearl ulcer possibly at previous banding sites with stigmata     of bleeding.  Attempt at hemostasis with hemoclip and banding was not successful resulting in recurrence of bleed/  Portal gastropathy, but no evidence of gastric varices/ Recurrent esophageal varices grade 2.  . Esophagogastroduodenoscopy  05/24/2012    Dr. Rito Ehrlich varices, portal gastropathy  . Esophagogastroduodenoscopy  04/11/12    Dr. Lorella Nimrod in the distal esophagus adherent lot and visible vessel treated with bicap. grade I varices in the distal esophagus, one ligating band placed, portal hypertensive gastropathy in the body of the stomach.  . Colonoscopy  09/29/2012    WUJ:WJXBJY varices. Scattered pancolonic diverticula/single cecal polyp-removed as described above  . Esophagogastroduodenoscopy  12/24/2012    Dr. Norvel Richards significant upper GI bleed secondary to bleeding esophageal varices. s/p hemostasis therapy with injection of a total of 9mL of 5% ethanolamine and application of 9 bands, complete egd not carried out.  . Schlerotherapy  12/24/2012    Procedure: Theresia Majors OF VARICES;  Surgeon: Corbin Ade, MD;  Location: [facility] SUITE;  Service: Endoscopy;  Laterality: N/A;  . Esophageal banding  12/24/2012    NWG:[id number] significant upper GI bleed secondary to bleeding esophageal varices    Prior to Admission medications   Medication Sig Start Date End Date Taking? Authorizing Provider  carvedilol (COREG) 6.25 MG tablet Take 6.25 mg by mouth 2 (two) times daily with a meal.   Yes Historical Provider, MD  gabapentin (NEURONTIN) 300 MG capsule Take 300-900 mg by mouth 4 (four) times daily.    Yes Historical Provider, MD  insulin aspart (NOVOLOG) 100 UNIT/ML injection Inject 28 Units into the skin 3  (three) times daily before meals.   Yes Historical Provider, MD  insulin glargine (LANTUS) 100 UNIT/ML injection Inject 70 Units into the skin 2 (two) times daily. Per sliding scale   Yes Historical Provider, MD  lactulose (CHRONULAC) 10 GM/15ML solution Take 20 g by mouth daily.   Yes Historical Provider, MD  metFORMIN (GLUCOPHAGE) 1000 MG tablet Take 1,000 mg by mouth 2 (two) times daily with a meal.   Yes Historical Provider, MD  omeprazole (PRILOSEC) 40 MG capsule Take 1 capsule (40 mg total) by mouth daily. 12/17/12  Yes Joselyn Arrow, NP  zolpidem (AMBIEN) 5 MG tablet Take 1-2 tablets (5-10 mg total) by mouth at bedtime as needed for sleep. 02/03/13  Yes Maurine Minister Kefalas, PA-C  furosemide (LASIX) 20 MG tablet Take 20 mg by mouth daily.     Historical Provider, MD  levofloxacin (LEVAQUIN) 750 MG tablet Take 1 tablet (750 mg total) by mouth daily. 02/08/13   Wilson Singer, MD    Allergies as of 02/16/2013 -  Review Complete 02/16/2013  Allergen Reaction Noted  . Aspirin  12/23/2012  . Ibuprofen Other (See Comments) 05/13/2012  . Tylenol (acetaminophen) Other (See Comments) 07/17/2011    Family History  Problem Relation Age of Onset  . Cirrhosis Father     etoh  . Colon cancer Neg Hx   . Anesthesia problems Neg Hx   . Hypotension Neg Hx   . Malignant hyperthermia Neg Hx   . Pseudochol deficiency Neg Hx   . Kidney cancer Mother   . Cancer Mother   . HIV Brother   . Cirrhosis Brother     nash    History   Social History  . Marital Status: Married    Spouse Name: N/A    Number of Children: 3  . Years of Education: N/A   Occupational History  . disabled    Social History Main Topics  . Smoking status: Current Every Day Smoker -- 1.00 packs/day for 30 years    Types: Cigarettes  . Smokeless tobacco: Not on file  . Alcohol Use: No     Comment: drank heavily for few years in 20s  . Drug Use: No  . Sexually Active: Yes    Birth Control/ Protection: None   Other  Topics Concern  . Not on file   Social History Narrative  . No narrative on file    Review of Systems: See HPI, otherwise negative ROS  Physical Exam: BP 130/81  Pulse 81  Temp(Src) 98 F (36.7 C) (Oral)  Ht 5\' 6"  (1.676 m)  Wt 207 lb 3.2 oz (93.985 kg)  BMI 33.46 kg/m2 General:   Alert,  cooperative alert well oriented gentleman. He has lost a lot of weight since I saw him in January.  No asterixis Skin:  Intact without significant lesions or rashes. Eyes:  Sclera clear, no icterus.   Conjunctiva pink. Ears:  Normal auditory acuity. Nose:  No deformity, discharge,  or lesions. Mouth:  No deformity or lesions. Neck:  Supple; no masses or thyromegaly. No significant cervical adenopathy. Lungs:  Clear throughout to auscultation.   No wheezes, crackles, or rhonchi. No acute distress. Heart:  Regular rate and rhythm; no murmurs, clicks, rubs,  or gallops. Abdomen: Non-distended, normal bowel sounds.  Soft and nontender without appreciable mass or hepatosplenomegaly.  Pulses:  Normal pulses noted. Extremities:  Without clubbing or edema.  Impression/Plan:  [name]/cirrhosis with recurrent refractory variceal hemorrhage now with TIPS. He is doing very well clinically. Blood sugars have been a challenge with lactulose.  His medication regimen has been markedly simplified since TIPS. He is no longer on any diuretics.  Recommendations:    Will obtain Doppler flow studies of the TIPS. We'll obtain a battery of labs. Once reviewed, may drop back on his lactulose further and add Xifaxan to his regimen.  Further recommendations to follow.

## 2013-02-18 ENCOUNTER — Encounter: Payer: Self-pay | Admitting: Family Medicine

## 2013-02-21 ENCOUNTER — Ambulatory Visit (INDEPENDENT_AMBULATORY_CARE_PROVIDER_SITE_OTHER): Payer: Medicaid Other | Admitting: Physician Assistant

## 2013-02-21 ENCOUNTER — Other Ambulatory Visit: Payer: Self-pay | Admitting: Internal Medicine

## 2013-02-21 ENCOUNTER — Ambulatory Visit (HOSPITAL_COMMUNITY)
Admission: RE | Admit: 2013-02-21 | Discharge: 2013-02-21 | Disposition: A | Discharge status: Home / Self Care | Payer: Medicaid Other | Source: Ambulatory Visit | Attending: Internal Medicine | Admitting: Internal Medicine

## 2013-02-21 ENCOUNTER — Encounter: Payer: Self-pay | Admitting: Physician Assistant

## 2013-02-21 VITALS — BP 144/96 | HR 80 | Temp 97.7°F | Resp 18 | Ht 65.5 in | Wt 211.0 lb

## 2013-02-21 DIAGNOSIS — Z09 Encounter for follow-up examination after completed treatment for conditions other than malignant neoplasm: Secondary | ICD-10-CM | POA: Insufficient documentation

## 2013-02-21 DIAGNOSIS — R161 Splenomegaly, not elsewhere classified: Secondary | ICD-10-CM | POA: Insufficient documentation

## 2013-02-21 DIAGNOSIS — Z95828 Presence of other vascular implants and grafts: Secondary | ICD-10-CM

## 2013-02-21 DIAGNOSIS — E119 Type 2 diabetes mellitus without complications: Secondary | ICD-10-CM

## 2013-02-21 DIAGNOSIS — Z9889 Other specified postprocedural states: Secondary | ICD-10-CM | POA: Insufficient documentation

## 2013-02-21 MED ORDER — LACTULOSE 10 GM/15ML PO SOLN: 10.0000 g | Freq: Two times a day (BID) | ORAL | Status: DC

## 2013-02-21 MED ORDER — RIFAXIMIN 550 MG PO TABS: 550.0000 mg | ORAL_TABLET | Freq: Two times a day (BID) | ORAL | Status: DC

## 2013-02-21 NOTE — Progress Notes (Signed)
Patient ID: Kyle [name] MRN: [id number], DOB: 1955/12/13, 57 y.o. Date of Encounter: 02/21/2013, 4:27 PM    Chief Complaint:  Chief Complaint  Patient presents with  . hosp f/u  was in [facility]  Diabetic Seizure     HPI: 57 y.o. year old male here to f/u recent hopitalization at [facility]. Hospitalized 02/03/13-02/08/13 per pt and wife.  Discharge summary not yet done in epic. I reviewed the records that are available. He presented with "seizure-like activity". Had Head CT and EEG and neuro eval. Apparently, it was decided these were not true seizures. Was felt that hi symptoms were secondary to uncontrolled blood sugar and recent changes to his lactulose dose.  He has already had post hospital f/u with Dr Kendell Bane 3/26 and has f/u with Dr Sharl Ma regarding his insulin and Diabetes management.  No current c/o, problems.      Home Meds: Current Outpatient Prescriptions on File Prior to Visit  Medication Sig Dispense Refill  . carvedilol (COREG) 6.25 MG tablet Take 6.25 mg by mouth 2 (two) times daily with a meal.      . gabapentin (NEURONTIN) 300 MG capsule Take 300-900 mg by mouth 4 (four) times daily.       . insulin aspart (NOVOLOG) 100 UNIT/ML injection Inject 28 Units into the skin 3 (three) times daily before meals.      . insulin glargine (LANTUS) 100 UNIT/ML injection Inject 70 Units into the skin 2 (two) times daily. Per sliding scale      . metFORMIN (GLUCOPHAGE) 1000 MG tablet Take 1,000 mg by mouth 2 (two) times daily with a meal.      . omeprazole (PRILOSEC) 40 MG capsule Take 1 capsule (40 mg total) by mouth daily.  30 capsule  11  . zolpidem (AMBIEN) 5 MG tablet Take 1-2 tablets (5-10 mg total) by mouth at bedtime as needed for sleep.  60 tablet  1  . furosemide (LASIX) 20 MG tablet Take 20 mg by mouth daily.       [name] levofloxacin (LEVAQUIN) 750 MG tablet Take 1 tablet (750 mg total) by mouth daily.  7 tablet  0   No current facility-administered medications on file prior  to visit.    Allergies:  Allergies  Allergen Reactions  . Aspirin   . Ibuprofen Other (See Comments)    Can't take per Dr Jena Gauss- harms livers  . Tylenol (Acetaminophen) Other (See Comments)    Causes legs to "run"      Review of Systems: Constitutional: negative for chills, fever, night sweats, weight changes, or fatigue  HEENT: negative for vision changes, hearing loss, congestion, rhinorrhea, ST, epistaxis, or sinus pressure Cardiovascular: negative for chest pain or palpitations Respiratory: negative for hemoptysis, wheezing, shortness of breath, or cough Abdominal: negative for abdominal pain, nausea, vomiting, diarrhea, or constipation Dermatological: negative for rash Neurologic: negative for headache, dizziness, or syncope    Physical Exam: Blood pressure 144/96, pulse 80, temperature 97.7 F (36.5 C), temperature source Oral, resp. rate 18, height 5' 5.5" (1.664 m), weight 211 lb (95.709 kg)., Body mass index is 34.57 kg/(m^2). General: Well developed, well nourished, in no acute distress.  Neck: Supple. No thyromegaly. Full ROM. No lymphadenopathy. Lungs: Clear bilaterally to auscultation without wheezes, rales, or rhonchi. Breathing is unlabored. Heart: RRR with S1 S2. No murmurs, rubs, or gallops appreciated. Abdomen: Soft, non-tender, non-distended with normoactive bowel sounds. No hepatomegaly. No rebound/guarding. No obvious abdominal masses. Msk:  Strength and tone normal  for age. Extremities/Skin: Warm and dry. No clubbing or cyanosis. No edema. No rashes or suspicious lesions. Neuro: Alert and oriented X 3. Moves all extremities spontaneously. Gait is normal. CNII-XII grossly in tact. Psych:  Responds to questions appropriately with a normal affect.   Labs:   ASSESSMENT AND PLAN:  57 y.o. year old male with  1. Diabetes 2. Esophageal Varices  He has a very lengthy, complicated history. Prior to the above mentioned hospitalization had just been  hopitalized at [facility] for 27 days in February. He has already had f/u with Dr. Kendell Bane and Dr. Sharl Ma. I have nothing furthr to add to his treatment plan at this time. I reviewed hospital records and see no mention of anything that I specifically needed to f/u.    [address] [place], [place], [facility] 02/21/2013 4:27 PM

## 2013-03-08 ENCOUNTER — Telehealth: Payer: Self-pay

## 2013-03-08 NOTE — Telephone Encounter (Signed)
Pt called- he has been taking the xifaxan and the lactulose as prescribed by RMR. He has recently noticed that about 20 minutes after taking the lactulose he has a severe episode of diarrhea. He wants to know if he should change his dosage on the lactulose? Please advise.

## 2013-03-08 NOTE — Telephone Encounter (Signed)
Please verify that he is on the reduced dose per RMR instruction (15g BID). If he is on that dose, he can try one tablespoon (10g) twice per day. He needs to maintain 3-4 BMs daily to reduce risk of rising ammonia level/encephalopathy. Watch for signs of tremors,confusion.  Continue Xifaxan 550mg  bid.

## 2013-03-08 NOTE — Telephone Encounter (Signed)
Tried to call pt- LMOM 

## 2013-03-09 NOTE — Telephone Encounter (Signed)
Pt is aware and will call back if he continues to have problems. 

## 2013-03-17 ENCOUNTER — Encounter (HOSPITAL_COMMUNITY): Payer: Medicaid Other | Attending: Oncology

## 2013-03-17 DIAGNOSIS — I8500 Esophageal varices without bleeding: Secondary | ICD-10-CM

## 2013-03-17 DIAGNOSIS — D649 Anemia, unspecified: Secondary | ICD-10-CM | POA: Insufficient documentation

## 2013-03-17 DIAGNOSIS — D509 Iron deficiency anemia, unspecified: Secondary | ICD-10-CM

## 2013-03-17 DIAGNOSIS — K7460 Unspecified cirrhosis of liver: Secondary | ICD-10-CM | POA: Insufficient documentation

## 2013-03-17 DIAGNOSIS — D6959 Other secondary thrombocytopenia: Secondary | ICD-10-CM | POA: Insufficient documentation

## 2013-03-17 LAB — CBC WITH DIFFERENTIAL/PLATELET
Basophils Relative: 0 % (ref 0–1)
Eosinophils Absolute: 0.1 10*3/uL (ref 0.0–0.7)
Hemoglobin: 14.1 g/dL (ref 13.0–17.0)
MCH: 29.4 pg (ref 26.0–34.0)
MCHC: 34.4 g/dL (ref 30.0–36.0)
Monocytes Relative: 10 % (ref 3–12)
Neutrophils Relative %: 55 % (ref 43–77)

## 2013-03-17 NOTE — Progress Notes (Signed)
Labs drawn today for cbc/diff,ferr 

## 2013-03-22 ENCOUNTER — Other Ambulatory Visit (HOSPITAL_COMMUNITY): Payer: Self-pay | Admitting: Oncology

## 2013-03-22 DIAGNOSIS — D509 Iron deficiency anemia, unspecified: Secondary | ICD-10-CM

## 2013-03-23 ENCOUNTER — Other Ambulatory Visit: Payer: Self-pay

## 2013-03-24 ENCOUNTER — Encounter (HOSPITAL_COMMUNITY): Payer: Medicaid Other | Attending: Oncology

## 2013-03-24 VITALS — BP 133/81 | HR 86 | Temp 97.6°F | Resp 20

## 2013-03-24 DIAGNOSIS — D509 Iron deficiency anemia, unspecified: Secondary | ICD-10-CM | POA: Insufficient documentation

## 2013-03-24 MED ORDER — SODIUM CHLORIDE 0.9 % IV SOLN
1020.0000 mg | Freq: Once | INTRAVENOUS | Status: AC
  Administered 2013-03-24: 1020 mg via INTRAVENOUS
  Filled 2013-03-24: qty 34

## 2013-03-24 MED ORDER — LACTULOSE 10 GM/15ML PO SOLN: 10.0000 g | Freq: Two times a day (BID) | ORAL | Status: DC

## 2013-03-24 MED ORDER — SODIUM CHLORIDE 0.9 % IJ SOLN
10.0000 mL | INTRAMUSCULAR | Status: DC | PRN
  Administered 2013-03-24: 10 mL via INTRAVENOUS
  Filled 2013-03-24: qty 10

## 2013-03-24 MED ORDER — SODIUM CHLORIDE 0.9 % IV SOLN
Freq: Once | INTRAVENOUS | Status: AC
  Administered 2013-03-24: 11:00:00 via INTRAVENOUS

## 2013-04-12 ENCOUNTER — Encounter: Payer: Self-pay | Admitting: Internal Medicine

## 2013-04-12 ENCOUNTER — Ambulatory Visit (INDEPENDENT_AMBULATORY_CARE_PROVIDER_SITE_OTHER): Payer: Medicaid Other | Admitting: Internal Medicine

## 2013-04-12 ENCOUNTER — Ambulatory Visit: Payer: Medicaid Other | Admitting: Internal Medicine

## 2013-04-12 VITALS — BP 139/80 | HR 84 | Temp 98.4°F | Ht 65.0 in | Wt 232.8 lb

## 2013-04-12 DIAGNOSIS — K7460 Unspecified cirrhosis of liver: Secondary | ICD-10-CM

## 2013-04-12 NOTE — Patient Instructions (Addendum)
Continue current medical regimen  Continue 2 gram sodium diet  Cbc, chem-20, INR just before office visit  Office visit 6 weeks  Follow-up with PCP regarding insominia  Cc PCP

## 2013-04-12 NOTE — Progress Notes (Signed)
Primary Care Physician:  Frazier Richards, PA-C Primary Gastroenterologist:  Dr. Jena Gauss  Pre-Procedure History & Physical: HPI:  Kyle [name] is a 57 y.o. male here for [name]/cirrhosis/encephalopath/ refractory variceal hemorrhage requiring TI PS. Last Doppler ultrasound demonstrated patency of TIPS. He is on lactulose 15 cc twice a day with 2-3 semi-formed bowel movements daily. On Xifaxan. Wife states no episodes of noticeable encephalopathy. Patient is weak and has insomnia. But overall is progressing in the right correction since his prolonged critical illness/hospitalizations at [facility]. He has gained 25 pounds since I last saw him. He is adherent to a 2 g sodium diet. He was recently started on Ambien for insomnia but at Mr. [name] with Dr. Mariel Sleet.  Past Medical History  Diagnosis Date  . DM (diabetes mellitus)   . Cirrhosis     bx proven steatohepatitis with cirrhosis (2010); per note from Feb 2011, received Hep A and B vaccines in 2010  . HTN (hypertension)   . RLS (restless legs syndrome)   . Sleep apnea   . Hyperlipidemia   . IDA (iron deficiency anemia)   . GERD (gastroesophageal reflux disease)   . Depression   . Peripheral neuropathy   . Urothelial cancer     2010, paillary low-grade, h/o recurrence 2011  . B12 deficiency   . Psoriasis   . 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 Anemia due to multiple mechanisms 05/13/2011  . History of alcohol abuse 05/13/2011  . ANEMIA-IRON DEFICIENCY 03/09/2009  . S/P endoscopy May 2012    4 columns grade II esophageal varices; due for repeat in Nov 2013   . S/P colonoscopy May 2012    Tubular adenoma  . Low back pain   . Cancer     bladder ca 05/2009 removal and  w/chemo wash  . Esophageal varices with bleeding 11/24/11    s/p emergent EGD 11/25/11 by Dr. Rhea Belton at [facility], Grade III esophageal varices s/p banding X 5  . Small bowel obstruction 02/15/2012    Admitted to [facility], managed by Dr. Leticia Penna, ventral hernia manually reduced   . Ventral hernia 02/15/12  . MRSA (methicillin resistant Staphylococcus aureus)   . Acute post-ligation esophageal ulcer with hemorrhage 04/11/2012  . Sleep apnea   . Clostridium difficile infection   . Cirrhosis of liver without mention of alcohol     has received hep A/hep B vaccines, afp-03/26/11= <1.3, ct abd 06/25/12= cirrhosis and portal venous hypertension with hepatomegaly  . H. pylori infection 12/24/2012    Past Surgical History  Procedure Laterality Date  . Carpel tunnel    . Shoulder surgery    . Adrenal mass surgery  05/2009    benign, left  . Bladder surgery  01/2009 and 06/2010    cancer 2010, small recurrence in 06/2010  . Colonoscopy  04/2009    moderate int hemorrhoids, rare sigmoid diverticula, one mm sessile hyperplastic rectal polyp  . Esophagogastroduodenoscopy  03/2009    small hh  . Small bowel capsule endoscopy  03/2009    couple of small benign appearing erosions, nonbleeding  . Egd/tcs  08/2007    small hiatal hernia, pancolonic diverticula, friable anal canal, 3cm salmon colored epithelium in distal esophagus, bx negative for Barrett's  . Skin cancer excision  Oct 2012    left arm  . Esophagogastroduodenoscopy  11/25/2011    Dr. Vedia Coffer III varices in the mid and distal esophagus, banding placed, portal astropathy  . Esophagogastroduodenoscopy  01/14/2012    Dr Nadiyah Zeis->4-5 columns Gr2  varices, 6 bands placed, HH, distal esophageal ulcer, portal gastropathy, antral erosions  . Esophagogastroduodenoscopy  03/17/2012    Dr. Rito Ehrlich varices status post band ligation. Hiatal hernia. Portal gastropathy.  . Umbilical hernia repair  04/05/2012    Procedure: HERNIA REPAIR UMBILICAL ADULT;  Surgeon: Fabio Bering, MD;  Location: [facility]S;  Service: General;  Laterality: N/A;  . Esophagogastroduodenoscopy  04/11/2012    Dr. Lysle Pearl ulcer possibly at previous banding sites with stigmata     of bleeding.  Attempt at hemostasis with hemoclip and banding was not  successful resulting in recurrence of bleed/  Portal gastropathy, but no evidence of gastric varices/ Recurrent esophageal varices grade 2.  . Esophagogastroduodenoscopy  05/24/2012    Dr. Rito Ehrlich varices, portal gastropathy  . Esophagogastroduodenoscopy  04/11/12    Dr. Lorella Nimrod in the distal esophagus adherent lot and visible vessel treated with bicap. grade I varices in the distal esophagus, one ligating band placed, portal hypertensive gastropathy in the body of the stomach.  . Colonoscopy  09/29/2012    WUJ:WJXBJY varices. Scattered pancolonic diverticula/single cecal polyp-removed as described above  . Esophagogastroduodenoscopy  12/24/2012    Dr. Norvel Richards significant upper GI bleed secondary to bleeding esophageal varices. s/p hemostasis therapy with injection of a total of 9mL of 5% ethanolamine and application of 9 bands, complete egd not carried out.  . Schlerotherapy  12/24/2012    Procedure: Theresia Majors OF VARICES;  Surgeon: Corbin Ade, MD;  Location: [facility];  Service: Endoscopy;  Laterality: N/A;  . Esophageal banding  12/24/2012    NWG:[id number] significant upper GI bleed secondary to bleeding esophageal varices    Prior to Admission medications   Medication Sig Start Date End Date Taking? Authorizing Provider  carvedilol (COREG) 6.25 MG tablet Take 6.25 mg by mouth 2 (two) times daily with a meal.   Yes Historical Provider, MD  gabapentin (NEURONTIN) 300 MG capsule Take 300-900 mg by mouth 4 (four) times daily.    Yes Historical Provider, MD  insulin regular (NOVOLIN R,HUMULIN R) 100 units/mL injection Inject 16 Units into the skin 3 (three) times daily before meals.   Yes Historical Provider, MD  lactulose (CHRONULAC) 10 GM/15ML solution Take 15 g by mouth 2 (two) times daily. 03/23/13  Yes Tiffany Kocher, PA-C  metFORMIN (GLUCOPHAGE) 1000 MG tablet Take 1,000 mg by mouth 2 (two) times daily with a meal.   Yes Historical Provider, MD   omeprazole (PRILOSEC) 40 MG capsule Take 1 capsule (40 mg total) by mouth daily. 12/17/12  Yes Joselyn Arrow, NP  rifaximin (XIFAXAN) 550 MG TABS Take 1 tablet (550 mg total) by mouth 2 (two) times daily. 02/21/13  Yes Corbin Ade, MD  zolpidem (AMBIEN) 5 MG tablet Take 1-2 tablets (5-10 mg total) by mouth at bedtime as needed for sleep. 02/03/13  Yes Maurine Minister Kefalas, PA-C  furosemide (LASIX) 20 MG tablet Take 20 mg by mouth daily.     Historical Provider, MD  insulin aspart (NOVOLOG) 100 UNIT/ML injection Inject 28 Units into the skin 3 (three) times daily before meals.    Historical Provider, MD  insulin glargine (LANTUS) 100 UNIT/ML injection Inject 70 Units into the skin 2 (two) times daily. Per sliding scale    Historical Provider, MD  levofloxacin (LEVAQUIN) 750 MG tablet Take 1 tablet (750 mg total) by mouth daily. 02/08/13   Wilson Singer, MD    Allergies as of 04/12/2013 - Review Complete 04/12/2013  Allergen Reaction Noted  .  Aspirin  12/23/2012  . Ibuprofen Other (See Comments) 05/13/2012  . Tylenol (acetaminophen) Other (See Comments) 07/17/2011    Family History  Problem Relation Age of Onset  . Cirrhosis Father     etoh  . Colon cancer Neg Hx   . Anesthesia problems Neg Hx   . Hypotension Neg Hx   . Malignant hyperthermia Neg Hx   . Pseudochol deficiency Neg Hx   . Kidney cancer Mother   . Cancer Mother   . HIV Brother   . Cirrhosis Brother     nash    History   Social History  . Marital Status: Married    Spouse Name: N/A    Number of Children: 3  . Years of Education: N/A   Occupational History  . disabled    Social History Main Topics  . Smoking status: Current Every Day Smoker -- 1.50 packs/day for 30 years    Types: Cigarettes  . Smokeless tobacco: Never Used  . Alcohol Use: No     Comment: drank heavily for few years in 20s  . Drug Use: No  . Sexually Active: Yes    Birth Control/ Protection: None   Other Topics Concern  . Not on file    Social History Narrative  . No narrative on file    Review of Systems: See HPI, otherwise negative ROS  Physical Exam: BP 139/80  Pulse 84  Temp(Src) 98.4 F (36.9 C) (Oral)  Ht 5\' 5"  (1.651 m)  Wt 232 lb 12.8 oz (105.597 kg)  BMI 38.74 kg/m2 General:   Alert,  Well-developed, well-nourished, pleasant and cooperative in NAD. Accompanied by wife. No asterixis. Skin:  Intact without significant lesions or rashes. Eyes:  Sclera clear, no icterus.   Conjunctiva pink. Ears:  Normal auditory acuity. Nose:  No deformity, discharge,  or lesions. Mouth:  No deformity or lesions. Neck:  Supple; no masses or thyromegaly. No significant cervical adenopathy. Lungs:  Clear throughout to auscultation.   No wheezes, crackles, or rhonchi. No acute distress. Heart:  Regular rate and rhythm; no murmurs, clicks, rubs,  or gallops. Abdomen: Obese. Positive bowel sounds soft nontender. He has a ballotable spleen. Liver edge at right costal margin. No significant shifting dullness or fluid wave.   Pulses:  Normal pulses noted. Extremities:  Without clubbing or edema.  Impression/Plan:  57 year old gentleman [name]/cirrhosis status post TIPS refractory variceal hemorrhage. Status post prolonged hospitalization secondary to ARDS. He is steadily improving. He really hasn't had any significant encephalopathy symptoms since his last visit. Less stooling which is more manageable for him with a cutback and doesn't lactulose the addition of Xopenex and.  He's gained a significant amount of weight but I suspect this is just or rebound nutritional phenomenon. I do not detect any significant fluid retention, clinically.     Recommendations:  Continue current medical regimen. Continue 2 g sodium diet. See primary care physician regarding insomnia. I'll plan to see him back in 6 weeks with labs to be drawn just before his office visit.

## 2013-04-20 ENCOUNTER — Other Ambulatory Visit: Payer: Self-pay | Admitting: Internal Medicine

## 2013-04-20 ENCOUNTER — Other Ambulatory Visit: Payer: Self-pay

## 2013-04-20 DIAGNOSIS — K7460 Unspecified cirrhosis of liver: Secondary | ICD-10-CM

## 2013-04-21 ENCOUNTER — Other Ambulatory Visit (HOSPITAL_COMMUNITY): Payer: Self-pay | Admitting: Oncology

## 2013-04-21 ENCOUNTER — Other Ambulatory Visit: Payer: Self-pay | Admitting: Internal Medicine

## 2013-04-21 DIAGNOSIS — G4700 Insomnia, unspecified: Secondary | ICD-10-CM

## 2013-04-21 NOTE — Telephone Encounter (Signed)
Pt called today at 430pm to let us know he is out of RF of his blood pressure medicine (carvedilol 6.25 mg) and could we call in a refill to [facility] in [place].

## 2013-04-21 NOTE — Telephone Encounter (Signed)
Needs to obtain from prescribing doctor.

## 2013-04-25 ENCOUNTER — Telehealth: Payer: Self-pay | Admitting: Physician Assistant

## 2013-04-25 MED ORDER — CARVEDILOL 6.25 MG PO TABS: 6.2500 mg | ORAL_TABLET | Freq: Two times a day (BID) | ORAL | Status: DC

## 2013-04-25 NOTE — Telephone Encounter (Signed)
Medication refilled per protocol. 

## 2013-05-02 ENCOUNTER — Ambulatory Visit (INDEPENDENT_AMBULATORY_CARE_PROVIDER_SITE_OTHER): Payer: Medicaid Other | Admitting: Physician Assistant

## 2013-05-02 ENCOUNTER — Encounter: Payer: Self-pay | Admitting: Physician Assistant

## 2013-05-02 VITALS — BP 128/84 | HR 84 | Temp 98.1°F | Resp 20 | Ht 65.0 in | Wt 236.0 lb

## 2013-05-02 DIAGNOSIS — L03119 Cellulitis of unspecified part of limb: Secondary | ICD-10-CM

## 2013-05-02 DIAGNOSIS — L02419 Cutaneous abscess of limb, unspecified: Secondary | ICD-10-CM

## 2013-05-02 MED ORDER — DOXYCYCLINE HYCLATE 100 MG PO TABS: 100.0000 mg | ORAL_TABLET | Freq: Two times a day (BID) | ORAL | Status: DC

## 2013-05-02 MED ORDER — HYDROCODONE-ACETAMINOPHEN 5-325 MG PO TABS: 1.0000 | ORAL_TABLET | Freq: Four times a day (QID) | ORAL | Status: DC | PRN

## 2013-05-02 MED ORDER — SULFAMETHOXAZOLE-TRIMETHOPRIM 800-160 MG PO TABS: 1.0000 | ORAL_TABLET | Freq: Two times a day (BID) | ORAL | Status: DC

## 2013-05-02 NOTE — Progress Notes (Signed)
Patient ID: Kyle [name] MRN: [id number], DOB: [birth year], 57 y.o. Date of Encounter: 05/02/2013, 2:53 PM    Chief Complaint:  Chief Complaint  Patient presents with  . c/o staph infection on leg    left thigh     HPI: 57 y.o. year old white male here to evaluate "spot" on left thigh. Says he first noticed this about 5 days ago. At that time, "it started out like a pimple." Since then, got larger and more painful. Has been draining.  Reports that he has a h/o "staph infections"-"they usually happen when he is in the hospital but not this time-got this one and not even in the hospital. "   Home Meds: See attached medication section for any medications that were entered at today's visit. The computer does not put those onto this list.The following list is a list of meds entered prior to today's visit.   Current Outpatient Prescriptions on File Prior to Visit  Medication Sig Dispense Refill  . carvedilol (COREG) 6.25 MG tablet Take 1 tablet (6.25 mg total) by mouth 2 (two) times daily with a meal.  60 tablet  5  . furosemide (LASIX) 20 MG tablet Take 20 mg by mouth daily.       [name] gabapentin (NEURONTIN) 300 MG capsule Take 300-900 mg by mouth 4 (four) times daily.       . insulin aspart (NOVOLOG) 100 UNIT/ML injection Inject 28 Units into the skin 3 (three) times daily before meals.      . insulin glargine (LANTUS) 100 UNIT/ML injection Inject 70 Units into the skin 2 (two) times daily. Per sliding scale      . insulin regular (NOVOLIN R,HUMULIN R) 100 units/mL injection Inject 16 Units into the skin 3 (three) times daily before meals.      . lactulose (CHRONULAC) 10 GM/15ML solution Take 15 g by mouth 2 (two) times daily.      [name] levofloxacin (LEVAQUIN) 750 MG tablet Take 1 tablet (750 mg total) by mouth daily.  7 tablet  0  . metFORMIN (GLUCOPHAGE) 1000 MG tablet Take 1,000 mg by mouth 2 (two) times daily with a meal.      . omeprazole (PRILOSEC) 40 MG capsule Take 1 capsule (40 mg total)  by mouth daily.  30 capsule  11  . rifaximin (XIFAXAN) 550 MG TABS Take 1 tablet (550 mg total) by mouth 2 (two) times daily.  60 tablet  4  . zolpidem (AMBIEN) 5 MG tablet TAKE ONE TO TWO TABLETS BY MOUTH AT BEDTIME AS NEEDED FOR SLEEP  60 tablet  2   No current facility-administered medications on file prior to visit.    Allergies:  Allergies  Allergen Reactions  . Aspirin   . Ibuprofen Other (See Comments)    Can't take per Dr Jena Gauss- harms livers  . Tylenol (Acetaminophen) Other (See Comments)    Causes legs to "run"      Review of Systems: See HPI for pertinent ROS. All other ROS negative.    Physical Exam: Blood pressure 128/84, pulse 84, temperature 98.1 F (36.7 C), temperature source Oral, resp. rate 20, height 5\' 5"  (1.651 m), weight 236 lb (107.049 kg)., Body mass index is 39.27 kg/(m^2). General: Overweight WM. Appears in no acute distress. Lungs: Clear bilaterally to auscultation without wheezes, rales, or rhonchi. Breathing is unlabored. Heart: Regular rhythm. No murmurs, rubs, or gallops. Msk:  Strength and tone normal for age. Extremities/Skin: Left Lateral Thigh: 2 inch area of  diffuse erythema. 1 inch area that is open and draining. No firm abscess remaining-already open and draining Neuro: Alert and oriented X 3. Moves all extremities spontaneously. Gait is normal. CNII-XII grossly in tact. Psych:  Responds to questions appropriately with a normal affect.     ASSESSMENT AND PLAN:  57 y.o. year old male with  1. Cellulitis and abscess of leg-left lateral thigh - Culture, routine-abscess - sulfamethoxazole-trimethoprim (BACTRIM DS,SEPTRA DS) 800-160 MG per tablet; Take 1 tablet by mouth 2 (two) times daily.  Dispense: 28 tablet; Refill: 0 - doxycycline (VIBRA-TABS) 100 MG tablet; Take 1 tablet (100 mg total) by mouth 2 (two) times daily.  Dispense: 28 tablet; Refill: 0 - HYDROcodone-acetaminophen (NORCO/VICODIN) 5-325 MG per tablet; Take 1 tablet by mouth every  6 (six) hours as needed for pain.  Dispense: 30 tablet; Refill: 0  If site worsens or does not improve in 48 hours or if does not completely resolve, then f/u.   NOTE: EPIC INDICATES H/O MRSA, WHICH I SUSPECTED THIS TO BE  Signed, Shon Hale [place], [place], [facility] 05/02/2013 2:53 PM

## 2013-05-04 ENCOUNTER — Telehealth: Payer: Self-pay | Admitting: Family Medicine

## 2013-05-04 NOTE — Telephone Encounter (Signed)
Message copied by Donne Anon on Wed May 04, 2013  9:36 AM ------      Message from: Lynnea Ferrier      Created: Tue May 03, 2013  5:32 PM       Culture is negative ------

## 2013-05-05 ENCOUNTER — Encounter (HOSPITAL_COMMUNITY): Payer: Medicaid Other | Attending: Oncology

## 2013-05-05 DIAGNOSIS — D509 Iron deficiency anemia, unspecified: Secondary | ICD-10-CM | POA: Insufficient documentation

## 2013-05-05 DIAGNOSIS — D6959 Other secondary thrombocytopenia: Secondary | ICD-10-CM | POA: Insufficient documentation

## 2013-05-05 DIAGNOSIS — D649 Anemia, unspecified: Secondary | ICD-10-CM | POA: Insufficient documentation

## 2013-05-05 LAB — CULTURE, ROUTINE-ABSCESS: Gram Stain: NONE SEEN

## 2013-05-05 LAB — CBC
MCH: 31.7 pg (ref 26.0–34.0)
MCV: 90.1 fL (ref 78.0–100.0)
Platelets: 75 10*3/uL — ABNORMAL LOW (ref 150–400)
RDW: 16.1 % — ABNORMAL HIGH (ref 11.5–15.5)
WBC: 3.8 10*3/uL — ABNORMAL LOW (ref 4.0–10.5)

## 2013-05-05 LAB — DIFFERENTIAL
Basophils Absolute: 0 10*3/uL (ref 0.0–0.1)
Eosinophils Absolute: 0.1 10*3/uL (ref 0.0–0.7)
Eosinophils Relative: 3 % (ref 0–5)
Lymphocytes Relative: 36 % (ref 12–46)

## 2013-05-05 NOTE — Progress Notes (Signed)
Labs drawn today for cbc/diff,ferr 

## 2013-05-06 ENCOUNTER — Telehealth: Payer: Self-pay | Admitting: Family Medicine

## 2013-05-06 ENCOUNTER — Encounter (HOSPITAL_BASED_OUTPATIENT_CLINIC_OR_DEPARTMENT_OTHER): Payer: Medicaid Other | Admitting: Oncology

## 2013-05-06 VITALS — BP 138/81 | HR 91 | Resp 18 | Wt 236.7 lb

## 2013-05-06 DIAGNOSIS — D6959 Other secondary thrombocytopenia: Secondary | ICD-10-CM

## 2013-05-06 DIAGNOSIS — I8500 Esophageal varices without bleeding: Secondary | ICD-10-CM

## 2013-05-06 DIAGNOSIS — K7460 Unspecified cirrhosis of liver: Secondary | ICD-10-CM

## 2013-05-06 DIAGNOSIS — I10 Essential (primary) hypertension: Secondary | ICD-10-CM

## 2013-05-06 DIAGNOSIS — IMO0001 Reserved for inherently not codable concepts without codable children: Secondary | ICD-10-CM

## 2013-05-06 DIAGNOSIS — D509 Iron deficiency anemia, unspecified: Secondary | ICD-10-CM

## 2013-05-06 DIAGNOSIS — D6489 Other specified anemias: Secondary | ICD-10-CM

## 2013-05-06 LAB — FERRITIN: Ferritin: 166 ng/mL (ref 22–322)

## 2013-05-06 NOTE — Progress Notes (Signed)
DIXON,MARY BETH, PA-C [address] [place] [address]  ANEMIA-IRON DEFICIENCY - Plan: CBC with Differential, Ferritin, CBC with Differential, Ferritin  HEPATIC CIRRHOSIS  Thrombocytopenia due to hypersplenism - Plan: CBC with Differential, Ferritin, CBC with Differential, Ferritin  Anemia due to multiple mechanisms - Plan: CBC with Differential, Ferritin, CBC with Differential, Ferritin  Esophageal varices  CURRENT THERAPY: Intermittent Feraheme 1020 mg IV PRN lab results.  Last infusion was on 03/24/2013.  INTERVAL HISTORY: Kyle [name] 57 y.o. male returns for  regular  visit for followup of anemia of multiple mechanisms.  Since our last encounter, R[place] was admitted to the [facility] from 02/03/2013- 02/08/2013 for: Seizure, new onset, likely related to severely uncontrolled diabetes with hyperglycemia. No DKA. EEG unremarkable. No evidence of CVA on MRI brain scan.  I personally reviewed and went over laboratory results with the patient.  WBC is low, yet stable at 3.8 with  Normal differential.  Platelet count remains low at 75,000 but very stable.  Both abnormalities are secondary to cirrhosis of liver and hypersplenism. Hgb is WNL at 14.1 g/dL.  I personally reviewed and went over radiographic studies with the patient.  MRI of brain did not show any abnormalities lending to a reason for seizure activity, but T1 and T2 shortening is noted secondary to prior IV iron.  Keni denies any complaints. He is presently on lactulose for control of his ammonia level. He reports that this is working very well and of course keeping his bowels "extremely regular." He is taking Ambien on a when necessary basis and has not repeat a refill today. He says that he has not required an Ambien in approximately one weeks time. I've encouraged him to only take medication when needed.  Hematologically, the patient denies any complaints and ROS questioning is negative. He is accompanied by his  brother today who likes to participate in conversation about himself.  The future plan with the patient is to perform laboratory work in 6 weeks and 12 weeks and have him come back in approximately 12 weeks for followup. We will give him IV Feraheme as needed per laboratory results. He was encouraged to call the clinic if he starts having signs and symptoms of iron deficiency anemia and we will give him in immediately for laboratory work.    Past Medical History  Diagnosis Date  . DM (diabetes mellitus)   . Cirrhosis     bx proven steatohepatitis with cirrhosis (2010); per note from Feb 2011, received Hep A and B vaccines in 2010  . HTN (hypertension)   . RLS (restless legs syndrome)   . Sleep apnea   . Hyperlipidemia   . IDA (iron deficiency anemia)   . GERD (gastroesophageal reflux disease)   . Depression   . Peripheral neuropathy   . Urothelial cancer     2010, paillary low-grade, h/o recurrence 2011  . B12 deficiency   . Psoriasis   . 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 Anemia due to multiple mechanisms 05/13/2011  . History of alcohol abuse 05/13/2011  . ANEMIA-IRON DEFICIENCY 03/09/2009  . S/P endoscopy May 2012    4 columns grade II esophageal varices; due for repeat in Nov 2013   . S/P colonoscopy May 2012    Tubular adenoma  . Low back pain   . Cancer     bladder ca 05/2009 removal and  w/chemo wash  . Esophageal varices with bleeding(456.0) 11/24/11    s/p emergent  EGD 11/25/11 by Dr. Rhea Belton at [facility], Grade III esophageal varices s/p banding X 5  . Small bowel obstruction 02/15/2012    Admitted to [facility], managed by Dr. Leticia Penna, ventral hernia manually reduced  . Ventral hernia 02/15/12  . MRSA (methicillin resistant Staphylococcus aureus)   . Acute post-ligation esophageal ulcer with hemorrhage 04/11/2012  . Sleep apnea   . Clostridium difficile infection   . Cirrhosis of liver without mention of alcohol     has received hep A/hep B vaccines, afp-03/26/11= <1.3,  ct abd 06/25/12= cirrhosis and portal venous hypertension with hepatomegaly  . H. pylori infection 12/24/2012    has BLADDER CANCER; DIABETES MELLITUS, TYPE II, CONTROLLED, WITH COMPLICATIONS; ADRENAL MASS; HYPERLIPIDEMIA; ANEMIA-IRON DEFICIENCY; ANEMIA, VITAMIN B12 DEFICIENCY; SMOKER; DEPRESSION; SLEEP APNEA, OBSTRUCTIVE, MODERATE; RESTLESS LEG SYNDROME; HYPERTENSION; GERD; HEPATIC CIRRHOSIS; Other chronic nonalcoholic liver disease; ARTHRITIS; SHOULDER PAIN, LEFT; INSOMNIA; FATIGUE; WEIGHT GAIN; CHEST DISCOMFORT; PROTEINURIA; ABNORMAL ELECTROCARDIOGRAM; Thrombocytopenia due to hypersplenism; Anemia due to multiple mechanisms; History of alcohol abuse; Abdominal pain; External hemorrhoid; Upper GI bleed; Hypotension; Diarrhea; Esophageal varices; Umbilical hernia; History of small bowel obstruction; Acute post-ligation esophageal ulcer with hemorrhage; Acute posthemorrhagic anemia; Acute respiratory failure; UTI (urinary tract infection); Hyperkalemia; H. pylori infection; Microscopic hematuria; Hemorrhagic shock; Respiratory failure with hypercapnia; Aspiration pneumonia; SIRS (systemic inflammatory response syndrome); Seizure; and Fever, unspecified on his problem list.     is allergic to aspirin; ibuprofen; and tylenol.  Mr. Enyeart does not currently have medications on file.  Past Surgical History  Procedure Laterality Date  . Carpel tunnel    . Shoulder surgery    . Adrenal mass surgery  05/2009    benign, left  . Bladder surgery  01/2009 and 06/2010    cancer 2010, small recurrence in 06/2010  . Colonoscopy  04/2009    moderate int hemorrhoids, rare sigmoid diverticula, one mm sessile hyperplastic rectal polyp  . Esophagogastroduodenoscopy  03/2009    small hh  . Small bowel capsule endoscopy  03/2009    couple of small benign appearing erosions, nonbleeding  . Egd/tcs  08/2007    small hiatal hernia, pancolonic diverticula, friable anal canal, 3cm salmon colored epithelium in distal esophagus,  bx negative for Barrett's  . Skin cancer excision  Oct 2012    left arm  . Esophagogastroduodenoscopy  11/25/2011    Dr. Vedia Coffer III varices in the mid and distal esophagus, banding placed, portal astropathy  . Esophagogastroduodenoscopy  01/14/2012    Dr Rourk->4-5 columns Gr2 varices, 6 bands placed, HH, distal esophageal ulcer, portal gastropathy, antral erosions  . Esophagogastroduodenoscopy  03/17/2012    Dr. Rito Ehrlich varices status post band ligation. Hiatal hernia. Portal gastropathy.  . Umbilical hernia repair  04/05/2012    Procedure: HERNIA REPAIR UMBILICAL ADULT;  Surgeon: Fabio Bering, MD;  Location: [facility];  Service: General;  Laterality: N/A;  . Esophagogastroduodenoscopy  04/11/2012    Dr. Lysle Pearl ulcer possibly at previous banding sites with stigmata     of bleeding.  Attempt at hemostasis with hemoclip and banding was not successful resulting in recurrence of bleed/  Portal gastropathy, but no evidence of gastric varices/ Recurrent esophageal varices grade 2.  . Esophagogastroduodenoscopy  05/24/2012    Dr. Rito Ehrlich varices, portal gastropathy  . Esophagogastroduodenoscopy  04/11/12    Dr. Lorella Nimrod in the distal esophagus adherent lot and visible vessel treated with bicap. grade I varices in the distal esophagus, one ligating band placed, portal hypertensive gastropathy in the body of the stomach.  . Colonoscopy  09/29/2012    ZOX:WRUEAV varices. Scattered pancolonic diverticula/single cecal polyp-removed as described above  . Esophagogastroduodenoscopy  12/24/2012    Dr. Norvel Richards significant upper GI bleed secondary to bleeding esophageal varices. s/p hemostasis therapy with injection of a total of 9mL of 5% ethanolamine and application of 9 bands, complete egd not carried out.  . Schlerotherapy  12/24/2012    Procedure: Theresia Majors OF VARICES;  Surgeon: Corbin Ade, MD;  Location: [facility];  Service: Endoscopy;  Laterality:  N/A;  . Esophageal banding  12/24/2012    WUJ:[id number] significant upper GI bleed secondary to bleeding esophageal varices    Denies any headaches, dizziness, double vision, fevers, chills, night sweats, nausea, vomiting, constipation, chest pain, heart palpitations, shortness of breath, blood in stool, black tarry stool, urinary pain, urinary burning, urinary frequency, hematuria.   PHYSICAL EXAMINATION  ECOG PERFORMANCE STATUS: 1 - Symptomatic but completely ambulatory  Filed Vitals:   05/06/13 1343  BP: 138/81  Pulse: 91  Resp: 18    GENERAL:alert, no distress, well nourished, well developed, comfortable, cooperative, obese, smiling and chronically ill appearing SKIN: skin color, texture, turgor are normal, no rashes or significant lesions HEAD: Normocephalic, No masses, lesions, tenderness or abnormalities EYES: normal, PERRLA, EOMI, Conjunctiva are pink and non-injected EARS: External ears normal OROPHARYNX:mucous membranes are moist  NECK: supple, no adenopathy, trachea midline LYMPH:  no palpable lymphadenopathy BREAST:not examined LUNGS: decreased breath sounds HEART: regular rate & rhythm, no murmurs, no gallops, S1 normal and S2 normal ABDOMEN: non-tender, obese and normal bowel sounds BACK: Back symmetric, no curvature., No CVA tenderness EXTREMITIES:less then 2 second capillary refill, no joint deformities, effusion, or inflammation, no skin discoloration  NEURO: alert & oriented x 3 with fluent speech, no focal motor/sensory deficits    LABORATORY DATA: CBC    Component Value Date/Time   WBC 3.8* 05/05/2013 1014   RBC 4.45 05/05/2013 1014   HGB 14.1 05/05/2013 1014   HCT 40.1 05/05/2013 1014   PLT 75* 05/05/2013 1014   MCV 90.1 05/05/2013 1014   MCH 31.7 05/05/2013 1014   MCHC 35.2 05/05/2013 1014   RDW 16.1* 05/05/2013 1014   LYMPHSABS 1.4 05/05/2013 1014   MONOABS 0.3 05/05/2013 1014   EOSABS 0.1 05/05/2013 1014   BASOSABS 0.0 05/05/2013 1014       Chemistry      Component Value Date/Time   NA 135 02/16/2013 1038   K 3.9 02/16/2013 1038   CL 101 02/16/2013 1038   CO2 25 02/16/2013 1038   BUN 6 02/16/2013 1038   CREATININE 0.63 02/16/2013 1038   CREATININE 0.68 02/08/2013 0509      Component Value Date/Time   CALCIUM 9.1 02/16/2013 1038   ALKPHOS 148* 02/16/2013 1038   AST 44* 02/16/2013 1038   ALT 35 02/16/2013 1038   BILITOT 1.1 02/16/2013 1038     Lab Results  Component Value Date   IRON 23* 12/18/2010   TIBC 427 12/18/2010   FERRITIN 166 05/05/2013     RADIOGRAPHIC STUDIES:  02/06/2013  *RADIOLOGY REPORT*  Clinical Data: New onset seizure. Diabetes mellitus. Iron  deficiency anemia, cirrhosis, alcohol abuse, hypertension.  MRI HEAD WITHOUT CONTRAST  Technique: Multiplanar, multiecho pulse sequences of the brain and  surrounding structures were obtained according to standard protocol  without intravenous contrast.  Comparison: CT head 02/03/2013.  Findings: No acute stroke or intracranial hemorrhage. No mass  lesion or hydrocephalus. Prominent T1 and T2 shortening of the  intracranial vasculature, pronounced on the T2*  gradient sequences,  a reflection of prior intravenous iron treatment. Mild atrophy  and chronic microvascular ischemic change. Flow voids maintained in  the carotid and basilar arteries. No worrisome osseous lesions.  Calvarium intact. No acute sinus or mastoid disease. No change  from prior CT.  IMPRESSION:  No acute intracranial findings. Intravascular T1 and T2 shortening  reflecting prior intravenous iron. No intracranial mass lesion.  Original Report Authenticated By: Davonna Belling, M.D.     ASSESSMENT:  1. Multifactorial anemia, last Feraheme 1020 IV infusion was on 05/10/2012.  2. Iron deficiency anemia requiring IV Feraheme infusions  3. Liver disease with fatty infiltration  4. Recent hospitalization for seizure-like activity due to hyperglycemia in March 2014. 5. Recent Hospitalization from  severe upper esophageal bleeding requiring multiple PRBC transfusions, intubation, and transfer to [facility] for TIPS procedure which was performed on 01/17/2013. He was in ICU at [facility] for over 20 days.  6. Obesity  7. Probably lung disease secondary to longstanding smoking history.  8. DM  9. Esophageal varices  10. RLS, failed Tramadol and Gabapentin.  11. Insomnia, on ambien and much improved.  Patient Active Problem List   Diagnosis Date Noted  . Fever, unspecified 02/05/2013  . Seizure 02/03/2013  . Aspiration pneumonia 12/25/2012  . SIRS (systemic inflammatory response syndrome) 12/25/2012  . UTI (urinary tract infection) 12/24/2012  . Hyperkalemia 12/24/2012  . H. pylori infection 12/24/2012  . Microscopic hematuria 12/24/2012  . Hemorrhagic shock 12/24/2012  . Respiratory failure with hypercapnia 12/24/2012  . Acute post-ligation esophageal ulcer with hemorrhage 04/11/2012  . Acute posthemorrhagic anemia 04/11/2012  . Acute respiratory failure 04/11/2012  . Umbilical hernia 03/03/2012  . History of small bowel obstruction 03/03/2012  . Diarrhea 12/19/2011  . Esophageal varices 12/19/2011  . Upper GI bleed 11/25/2011  . Hypotension 11/25/2011  . Abdominal pain 11/04/2011  . External hemorrhoid 11/04/2011  . 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 Anemia due to multiple mechanisms 05/13/2011  . History of alcohol abuse 05/13/2011  . Other chronic nonalcoholic liver disease 01/02/2010  . DIABETES MELLITUS, TYPE II, CONTROLLED, WITH COMPLICATIONS 07/23/2009  . BLADDER CANCER 05/21/2009  . HEPATIC CIRRHOSIS 05/21/2009  . SHOULDER PAIN, LEFT 04/19/2009  . SMOKER 04/04/2009  . GERD 04/04/2009  . ARTHRITIS 04/04/2009  . ANEMIA-IRON DEFICIENCY 03/09/2009  . ANEMIA, VITAMIN B12 DEFICIENCY 03/09/2009  . CHEST DISCOMFORT 02/28/2009  . ADRENAL MASS 12/27/2008  . INSOMNIA 12/27/2008  . RESTLESS LEG SYNDROME 12/13/2008  . SLEEP APNEA, OBSTRUCTIVE, MODERATE 10/25/2008  .  PROTEINURIA 10/06/2008  . HYPERLIPIDEMIA 10/04/2008  . DEPRESSION 10/04/2008  . HYPERTENSION 10/04/2008  . FATIGUE 10/04/2008  . WEIGHT GAIN 10/04/2008  . ABNORMAL ELECTROCARDIOGRAM 10/04/2008     PLAN:  1. I personally reviewed and went over laboratory results with the patient. 2. I personally reviewed and went over radiographic studies with the patient. 3. Follow-up with GI as directed 4. Follow-up with Endocrinologist as directed 5. Smoking cessation education provided 6. Feraheme IV PRN per lab results 7. Labs every 6 weeks: CBC diff, Ferritin 8. Return in 12 weeks for follow-up   All questions were answered. The patient knows to call the clinic with any problems, questions or concerns. We can certainly see the patient much sooner if necessary.  Patient and plan discussed with Dr. Erline Hau and he is in agreement with the aforementioned.   Brazil Voytko

## 2013-05-06 NOTE — Patient Instructions (Addendum)
[facility] Discharge Instructions  RECOMMENDATIONS MADE BY THE CONSULTANT AND ANY TEST RESULTS WILL BE SENT TO YOUR REFERRING PHYSICIAN.  MEDICATIONS PRESCRIBED:  None  INSTRUCTIONS GIVEN AND DISCUSSED: Signs and symptoms of iron deficiency anemia, call clinic and will work you in for a lab appointment  SPECIAL INSTRUCTIONS/FOLLOW-UP: Labs in 6 weeks and 12 weeks.  Return in about 3 months for follow-up. Continue follow-up with GI and endocrinology as directed Quit smoking  Thank you for choosing [facility] to provide your oncology and hematology care.  To afford each patient quality time with our providers, please arrive at least 15 minutes before your scheduled appointment time.  With your help, our goal is to use those 15 minutes to complete the necessary work-up to ensure our physicians have the information they need to help with your evaluation and healthcare recommendations.    Effective January 1st, 2014, we ask that you re-schedule your appointment with our physicians should you arrive 10 or more minutes late for your appointment.  We strive to give you quality time with our providers, and arriving late affects you and other patients whose appointments are after yours.    Again, thank you for choosing [facility].  Our hope is that these requests will decrease the amount of time that you wait before being seen by our physicians.       _____________________________________________________________  Should you have questions after your visit to [facility], please contact our office at [phone] between the hours of 8:30 a.m. and 5:00 p.m.  Voicemails left after 4:30 p.m. will not be returned until the following business day.  For prescription refill requests, have your pharmacy contact our office with your prescription refill request.     Iron Deficiency Anemia There are many types of anemia. Iron deficiency anemia  is the most common. Iron deficiency anemia is a decrease in the number of red blood cells caused by too little iron. Without enough iron, your body does not produce enough hemoglobin. Hemoglobin is a substance in red blood cells that carries oxygen to the body's tissues. Iron deficiency anemia may leave you tired and short of breath. CAUSES   Lack of iron in the diet.  This may be seen in infants and children, because there is little iron in milk.  This may be seen in adults who do not eat enough iron-rich foods.  This may be seen in pregnant or breastfeeding women who do not take iron supplements. There is a much higher need for iron intake at these times.  Poor absorption of iron, as seen with intestinal disorders.  Intestinal bleeding.  Heavy periods. SYMPTOMS  Mild anemia may not be noticeable. Symptoms may include:  Fatigue.  Headache.  Pale skin.  Weakness.  Shortness of breath.  Dizziness.  Cold hands and feet.  Fast or irregular heartbeat. DIAGNOSIS  Diagnosis requires a thorough evaluation and physical exam by your caregiver.  Blood tests are generally used to confirm iron deficiency anemia.  Additional tests may be done to find the underlying cause of your anemia. These may include:  Testing for blood in the stool (fecal occult blood test).  A procedure to see inside the colon and rectum (colonoscopy).  A procedure to see inside the esophagus and stomach (endoscopy). TREATMENT   Correcting the cause of the iron deficiency is the first step.  Medicines, such as oral contraceptives, can make heavy menstrual flows lighter.  Antibiotics  and other medicines can be used to treat peptic ulcers.  Surgery may be needed to remove a bleeding polyp, tumor, or fibroid.  Often, iron supplements (ferrous sulfate) are taken.  For the best iron absorption, take these supplements with an empty stomach.  You may need to take the supplements with food if you cannot  tolerate them on an empty stomach. Vitamin C improves the absorption of iron. Your caregiver may recommend taking your iron tablets with a glass of orange juice or vitamin C supplement.  Milk and antacids should not be taken at the same time as iron supplements. They may interfere with the absorption of iron.  Iron supplements can cause constipation. A stool softener is often recommended.  Pregnant and breastfeeding women will need to take extra iron, because their normal diet usually will not provide the required amount.  Patients who cannot tolerate iron by mouth can take it through a vein (intravenously) or by an injection into the muscle. HOME CARE INSTRUCTIONS   Ask your dietitian for help with diet questions.  Take iron and vitamins as directed by your caregiver.  Eat a diet rich in iron. Eat liver, lean beef, whole-grain bread, eggs, dried fruit, and dark green leafy vegetables. SEEK IMMEDIATE MEDICAL CARE IF:   You have a fainting episode. Do not drive yourself. Call your local emergency services (911 in [place] if no other help is available.  You have chest pain, nausea, or vomiting.  You develop severe or increased shortness of breath with activities.  You develop weakness or increased thirst.  You have a rapid heartbeat.  You develop unexplained sweating or become lightheaded when getting up from a chair or bed. MAKE SURE YOU:   Understand these instructions.  Will watch your condition.  Will get help right away if you are not doing well or get worse. Document Released: 11/07/2000 Document Revised: 02/02/2012 Document Reviewed: 03/19/2010 [facility] Patient Information 2014 [place], [place].

## 2013-05-06 NOTE — Telephone Encounter (Signed)
Message copied by Donne Anon on Fri May 06, 2013 10:18 AM ------      Message from: Lynnea Ferrier      Created: Thu May 05, 2013 12:04 PM       Culture is positive for staph now.  Is he taking bactrim and is it better? ------

## 2013-05-09 NOTE — Telephone Encounter (Signed)
Pt called back.  Told about wound culture results and told to be sure to finish all antibiotics.  Pt states wound is healing nicely.

## 2013-05-23 LAB — CBC WITH DIFFERENTIAL/PLATELET
Basophils Absolute: 0 10*3/uL (ref 0.0–0.1)
Basophils Relative: 0 % (ref 0–1)
Eosinophils Absolute: 0.1 10*3/uL (ref 0.0–0.7)
Eosinophils Relative: 3 % (ref 0–5)
MCH: 31.1 pg (ref 26.0–34.0)
MCHC: 34.7 g/dL (ref 30.0–36.0)
MCV: 89.6 fL (ref 78.0–100.0)
Platelets: 50 10*3/uL — ABNORMAL LOW (ref 150–400)
RDW: 16 % — ABNORMAL HIGH (ref 11.5–15.5)
WBC: 3.3 10*3/uL — ABNORMAL LOW (ref 4.0–10.5)

## 2013-05-23 LAB — COMPREHENSIVE METABOLIC PANEL
ALT: 29 U/L (ref 0–53)
AST: 40 U/L — ABNORMAL HIGH (ref 0–37)
Creat: 0.7 mg/dL (ref 0.50–1.35)
Total Bilirubin: 1.7 mg/dL — ABNORMAL HIGH (ref 0.3–1.2)

## 2013-05-24 ENCOUNTER — Ambulatory Visit (INDEPENDENT_AMBULATORY_CARE_PROVIDER_SITE_OTHER): Payer: Medicaid Other | Admitting: Gastroenterology

## 2013-05-24 ENCOUNTER — Encounter: Payer: Self-pay | Admitting: Gastroenterology

## 2013-05-24 VITALS — BP 135/82 | HR 73 | Temp 97.4°F | Ht 68.0 in | Wt 239.0 lb

## 2013-05-24 DIAGNOSIS — K7460 Unspecified cirrhosis of liver: Secondary | ICD-10-CM

## 2013-05-24 NOTE — Patient Instructions (Addendum)
1. Follow up with Dr. Jena Gauss in 8 weeks.  2. Stop evening dose of lactulose to see if diarrhea improves. Call if you become more lethargic, confused, or have tremors.  3. Continue Xifaxan twice a day as before.

## 2013-05-24 NOTE — Assessment & Plan Note (Signed)
57 year old gentleman with [name]/cirrhosis status post TIPS in 12/2012 for refractory variceal hemorrhage. Status post prolonged hospitalization secondary to ARDS. He continues to improve. He is at his baseline weight. No overt peripheral edema. Labs from yesterday are good. He continues to have "blow-out" after lactulose in AM. No episodes of HE. Drop evening dose to see if makes any difference. He will call if develops confusion, lethargy, tremors.   OV in 8 weeks with Dr. Jena Gauss. Continue 2 g sodium diet.

## 2013-05-24 NOTE — Progress Notes (Signed)
Primary Care Physician: Frazier Richards, PA-C  Primary Gastroenterologist:  Roetta Sessions, MD   Chief Complaint  Patient presents with  . Follow-up    HPI: Kyle [name] is a 57 y.o. male here for six-week followup. He was last seen on 04/12/2013 by Dr. Jena Gauss. 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  Last Doppler ultrasound demonstrated patency of TIPS 01/2013.  Feels better. Slowing getting strength back. Right ankle injury with new onset seizure (thought to be secondary to hyperglycemia/poorly controlled DM in 01/2013. Sometimes some swelling especially if walks on it a lot. Otherwise no notable swelling in legs. Sleeping better. No vomiting. Appetite fluctuates. No abdominal pain. No tremors or confusion. No jaundice. After AM lactulose has lots of loose stools/blowout but fine after PM dose of lactulose. No melena, brbpr. Continues to follow with Dr. Thornton Papas office for IDA.  Weight up 7 pounds since 04/12/13. 08/2012 236lb.  Current Outpatient Prescriptions  Medication Sig Dispense Refill  . carvedilol (COREG) 6.25 MG tablet Take 1 tablet (6.25 mg total) by mouth 2 (two) times daily with a meal.  60 tablet  5  . doxycycline (VIBRA-TABS) 100 MG tablet Take 1 tablet (100 mg total) by mouth 2 (two) times daily.  28 tablet  0  . gabapentin (NEURONTIN) 300 MG capsule Take 300-900 mg by mouth 4 (four) times daily.       [name] HYDROcodone-acetaminophen (NORCO/VICODIN) 5-325 MG per tablet Take 1 tablet by mouth every 6 (six) hours as needed for pain.  30 tablet  0  . insulin regular (NOVOLIN R,HUMULIN R) 100 units/mL injection Inject 16 Units into the skin 3 (three) times daily before meals.      . lactulose (CHRONULAC) 10 GM/15ML solution Take 15 g by mouth 2 (two) times daily.      . metFORMIN (GLUCOPHAGE) 1000 MG tablet Take 1,000 mg by mouth 2 (two)  times daily with a meal.      . omeprazole (PRILOSEC) 40 MG capsule Take 1 capsule (40 mg total) by mouth daily.  30 capsule  11  . rifaximin (XIFAXAN) 550 MG TABS Take 1 tablet (550 mg total) by mouth 2 (two) times daily.  60 tablet  4  . zolpidem (AMBIEN) 5 MG tablet TAKE ONE TO TWO TABLETS BY MOUTH AT BEDTIME AS NEEDED FOR SLEEP  60 tablet  2   No current facility-administered medications for this visit.    Allergies as of 05/24/2013 - Review Complete 05/24/2013  Allergen Reaction Noted  . Aspirin  12/23/2012  . Ibuprofen Other (See Comments) 05/13/2012  . Tylenol (acetaminophen) Other (See Comments) 07/17/2011    ROS:  General: Negative for anorexia, weight loss, fever, chills, fatigue. +weakness but improved. ENT: Negative for hoarseness, difficulty swallowing , nasal congestion. CV: Negative for chest pain, angina, palpitations, dyspnea on exertion, peripheral edema.  Respiratory: Negative for dyspnea at rest, dyspnea on exertion, cough, sputum, wheezing.  GI: See history of present illness. GU:  Negative for dysuria, hematuria, urinary incontinence, urinary frequency, nocturnal urination.  Endo: Negative for unusual weight change.    Physical Examination:   BP 135/82  Pulse 73  Temp(Src) 97.4 F (36.3 C) (Oral)  Ht 5\' 8"  (1.727 m)  Wt 239 lb (108.41 kg)  BMI 36.35 kg/m2  General: Well-nourished, well-developed in no acute distress.  Eyes: No icterus. Mouth: Oropharyngeal mucosa moist and pink , no lesions erythema or exudate.  Lungs: Clear to auscultation bilaterally.  Heart: Regular rate and rhythm, no murmurs rubs or gallops.  Abdomen: Bowel sounds are normal, nontender, nondistended, no hepatosplenomegaly or masses, no abdominal bruits or hernia , no rebound or guarding.   Extremities: No lower extremity edema. No clubbing or deformities. Neuro: Alert and oriented x 4   Skin: Warm and dry, no jaundice.   Psych: Alert and cooperative, normal mood and  affect.  Labs:  Lab Results  Component Value Date   WBC 3.3* 05/23/2013   HGB 13.7 05/23/2013   HCT 39.5 05/23/2013   MCV 89.6 05/23/2013   PLT 50* 05/23/2013   Lab Results  Component Value Date   CREATININE 0.70 05/23/2013   BUN 8 05/23/2013   NA 139 05/23/2013   K 4.2 05/23/2013   CL 107 05/23/2013   CO2 20 05/23/2013   Lab Results  Component Value Date   ALT 29 05/23/2013   AST 40* 05/23/2013   ALKPHOS 74 05/23/2013   BILITOT 1.7* 05/23/2013   Lab Results  Component Value Date   INR 1.33 04/20/2013   INR 1.28 02/16/2013   INR 1.32 02/08/2013     Imaging Studies: No results found.

## 2013-05-24 NOTE — Progress Notes (Signed)
Cc PCP 

## 2013-05-26 ENCOUNTER — Telehealth: Payer: Self-pay | Admitting: Physician Assistant

## 2013-05-26 MED ORDER — METFORMIN HCL 1000 MG PO TABS: 1000.0000 mg | ORAL_TABLET | Freq: Two times a day (BID) | ORAL | Status: DC

## 2013-05-26 NOTE — Telephone Encounter (Signed)
Med refilled.

## 2013-06-08 NOTE — Progress Notes (Signed)
Quick Note:  Pt returned call and was informed of results. ______ 

## 2013-06-16 ENCOUNTER — Encounter (HOSPITAL_COMMUNITY): Payer: Medicaid Other | Attending: Oncology

## 2013-06-16 DIAGNOSIS — D509 Iron deficiency anemia, unspecified: Secondary | ICD-10-CM | POA: Insufficient documentation

## 2013-06-16 DIAGNOSIS — D6959 Other secondary thrombocytopenia: Secondary | ICD-10-CM | POA: Insufficient documentation

## 2013-06-16 DIAGNOSIS — D649 Anemia, unspecified: Secondary | ICD-10-CM | POA: Insufficient documentation

## 2013-06-16 LAB — CBC WITH DIFFERENTIAL/PLATELET
Basophils Relative: 0 % (ref 0–1)
Eosinophils Relative: 2 % (ref 0–5)
HCT: 40.9 % (ref 39.0–52.0)
Hemoglobin: 15.2 g/dL (ref 13.0–17.0)
Lymphocytes Relative: 25 % (ref 12–46)
MCHC: 37.2 g/dL — ABNORMAL HIGH (ref 30.0–36.0)
MCV: 91.3 fL (ref 78.0–100.0)
Monocytes Absolute: 0.3 10*3/uL (ref 0.1–1.0)
Monocytes Relative: 8 % (ref 3–12)
Neutro Abs: 2.7 10*3/uL (ref 1.7–7.7)

## 2013-06-16 NOTE — Progress Notes (Signed)
Labs drawn today for cbc/diff,ferr 

## 2013-06-23 IMAGING — CR DG CHEST 1V PORT
1 series · 1 of 1 positions shown · non-contrast
Comparison: Chest x-ray 04/11/2012.

CLINICAL DATA: Status post intubation.  Evaluate for aspiration or
hemoptysis.

PORTABLE CHEST - 1 VIEW

[view not recorded]
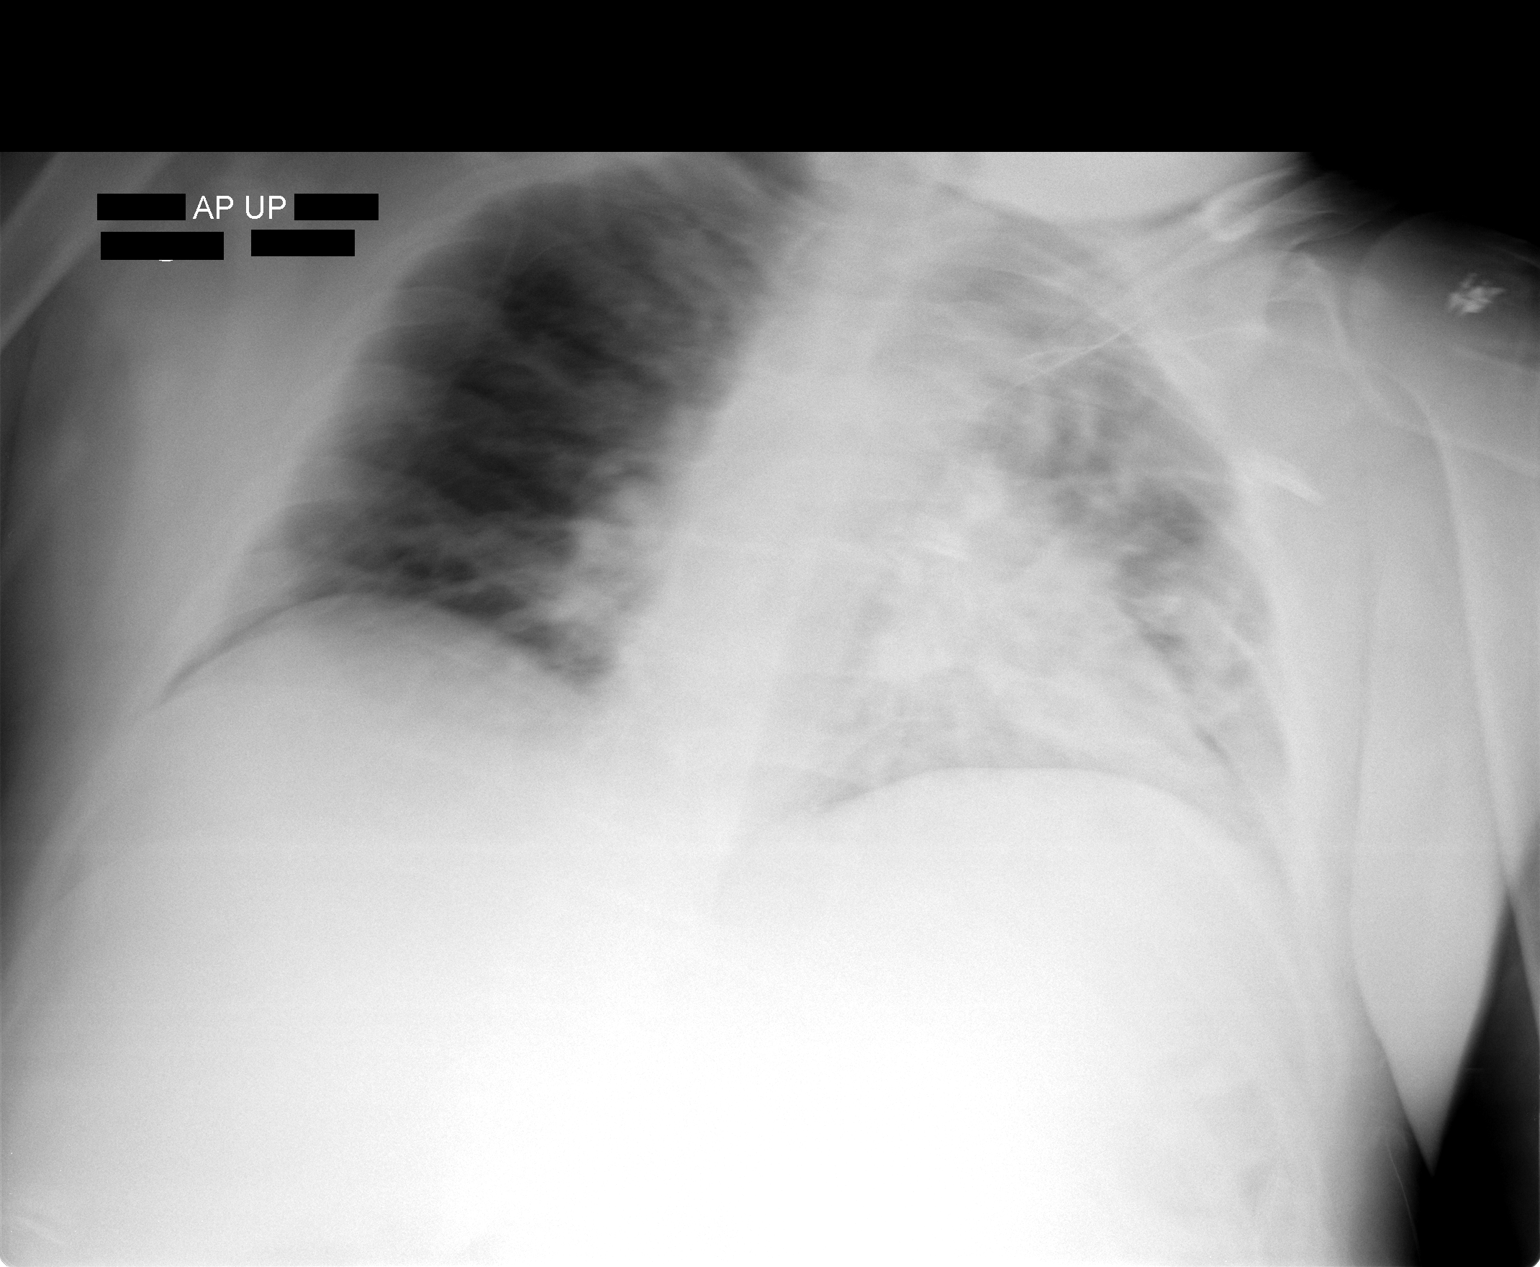

[1 of 1 positions shown; findings below may reference images not displayed]

FINDINGS: Study is limited by low lung volumes, patient rotation to
the left, and extensive gross patient motion.  Within these
limitations, the right lung appears relatively clear.  There are
extensive opacities throughout the left lung, which are poorly
defined secondary to motion.  There may be blunting of the left
costophrenic sulcus, which could suggest a small left-sided pleural
effusion.  No definite right-sided pleural effusion.  The patient
is rotated to the left on today's exam, resulting in distortion of
the mediastinal contours and reduced diagnostic sensitivity and
specificity for mediastinal pathology.
IMPRESSION: 1.  Severely limited examination secondary to patient positioning
and gross patient motion.  There appear to be new opacities
throughout the left lung, however these are poorly characterized.
Differential considerations would include aspiration, infection,
and/or alveolar hemorrhage. A repeat study (preferably a standing
PA and lateral) would be useful for further delineation if
clinically indicated.

## 2013-07-28 ENCOUNTER — Telehealth: Payer: Self-pay | Admitting: Physician Assistant

## 2013-07-28 ENCOUNTER — Encounter: Payer: Self-pay | Admitting: Family Medicine

## 2013-07-28 ENCOUNTER — Encounter (HOSPITAL_COMMUNITY): Payer: Medicaid Other | Attending: Oncology

## 2013-07-28 DIAGNOSIS — D649 Anemia, unspecified: Secondary | ICD-10-CM | POA: Insufficient documentation

## 2013-07-28 DIAGNOSIS — D6959 Other secondary thrombocytopenia: Secondary | ICD-10-CM

## 2013-07-28 DIAGNOSIS — D509 Iron deficiency anemia, unspecified: Secondary | ICD-10-CM | POA: Insufficient documentation

## 2013-07-28 LAB — CBC WITH DIFFERENTIAL/PLATELET
Eosinophils Relative: 2 % (ref 0–5)
HCT: 41.6 % (ref 39.0–52.0)
Lymphocytes Relative: 30 % (ref 12–46)
Lymphs Abs: 1.3 10*3/uL (ref 0.7–4.0)
MCH: 33 pg (ref 26.0–34.0)
MCV: 92.2 fL (ref 78.0–100.0)
Monocytes Absolute: 0.5 10*3/uL (ref 0.1–1.0)
RBC: 4.51 MIL/uL (ref 4.22–5.81)
WBC: 4.4 10*3/uL (ref 4.0–10.5)

## 2013-07-28 MED ORDER — METFORMIN HCL 1000 MG PO TABS: 1000.0000 mg | ORAL_TABLET | Freq: Two times a day (BID) | ORAL | Status: DC

## 2013-07-28 NOTE — Progress Notes (Signed)
Labs drawn today for cbc/diff,ferr 

## 2013-07-28 NOTE — Telephone Encounter (Signed)
Metformin 1000 mg tab 1 BID #60

## 2013-07-28 NOTE — Telephone Encounter (Signed)
rx refilled.  Pt due for routine OV and labs letter sent

## 2013-07-29 ENCOUNTER — Encounter (HOSPITAL_COMMUNITY): Payer: Self-pay | Admitting: Oncology

## 2013-07-29 ENCOUNTER — Ambulatory Visit (INDEPENDENT_AMBULATORY_CARE_PROVIDER_SITE_OTHER): Payer: Medicaid Other | Admitting: Internal Medicine

## 2013-07-29 ENCOUNTER — Telehealth: Payer: Self-pay | Admitting: Internal Medicine

## 2013-07-29 ENCOUNTER — Encounter (HOSPITAL_BASED_OUTPATIENT_CLINIC_OR_DEPARTMENT_OTHER): Payer: Medicaid Other | Admitting: Oncology

## 2013-07-29 ENCOUNTER — Encounter: Payer: Self-pay | Admitting: Internal Medicine

## 2013-07-29 VITALS — BP 140/82 | HR 80 | Temp 98.2°F | Ht 68.0 in | Wt 243.0 lb

## 2013-07-29 VITALS — BP 131/80 | HR 84 | Temp 98.3°F | Resp 18 | Wt 242.8 lb

## 2013-07-29 DIAGNOSIS — F17200 Nicotine dependence, unspecified, uncomplicated: Secondary | ICD-10-CM

## 2013-07-29 DIAGNOSIS — D509 Iron deficiency anemia, unspecified: Secondary | ICD-10-CM

## 2013-07-29 DIAGNOSIS — R5381 Other malaise: Secondary | ICD-10-CM

## 2013-07-29 DIAGNOSIS — D6489 Other specified anemias: Secondary | ICD-10-CM

## 2013-07-29 DIAGNOSIS — K7689 Other specified diseases of liver: Secondary | ICD-10-CM

## 2013-07-29 DIAGNOSIS — K7460 Unspecified cirrhosis of liver: Secondary | ICD-10-CM

## 2013-07-29 DIAGNOSIS — K7290 Hepatic failure, unspecified without coma: Secondary | ICD-10-CM

## 2013-07-29 NOTE — Patient Instructions (Addendum)
.  [facility] Discharge Instructions  RECOMMENDATIONS MADE BY THE CONSULTANT AND ANY TEST RESULTS WILL BE SENT TO YOUR REFERRING PHYSICIAN.  EXAM FINDINGS BY THE PHYSICIAN TODAY AND SIGNS OR SYMPTOMS TO REPORT TO CLINIC OR PRIMARY PHYSICIAN: Exam per Elijah Birk  MEDICATIONS PRESCRIBED:  Try taking 1 and 1/2   INSTRUCTIONS GIVEN AND DISCUSSED: We will do labs at 3 weeks, 6 weeks, and 12 weeks   SPECIAL INSTRUCTIONS/FOLLOW-UP: See you in 12 weeks   Thank you for choosing [facility] to provide your oncology and hematology care.  To afford each patient quality time with our providers, please arrive at least 15 minutes before your scheduled appointment time.  With your help, our goal is to use those 15 minutes to complete the necessary work-up to ensure our physicians have the information they need to help with your evaluation and healthcare recommendations.    Effective January 1st, 2014, we ask that you re-schedule your appointment with our physicians should you arrive 10 or more minutes late for your appointment.  We strive to give you quality time with our providers, and arriving late affects you and other patients whose appointments are after yours.    Again, thank you for choosing [facility].  Our hope is that these requests will decrease the amount of time that you wait before being seen by our physicians.       _____________________________________________________________  Should you have questions after your visit to [facility], please contact our office at [phone] between the hours of 8:30 a.m. and 5:00 p.m.  Voicemails left after 4:30 p.m. will not be returned until the following business day.  For prescription refill requests, have your pharmacy contact our office with your prescription refill request.

## 2013-07-29 NOTE — Progress Notes (Signed)
DIXON,MARY BETH, PA-C [address] [place] [address]  ANEMIA-IRON DEFICIENCY - Plan: CBC with Differential, Ferritin, CBC with Differential, Iron and TIBC, Ferritin  CURRENT THERAPY: Intermittent Feraheme 1020 mg IV PRN lab results. Last infusion was on 03/24/2013.   INTERVAL HISTORY: Kyle [name] 57 y.o. male returns for  regular  visit for followup of anemia of multiple mechanisms.  Kyle [name] is following up with Dr. Jena Gauss as directed and he recently saw Dr. Jena Gauss.  He is maintained on Lactulose (with a recent increase to 5 cc BID) and Xifaxan.  The patient is also scheduled for a screening ultrasound of liver next month.  Per Dr. Luvenia Starch note, he plans on getting Crawford back to [facility] transplant clinic the 1st of the year.  I personally reviewed and went over laboratory results with the patient.  His Hgb is well WNL at 14.9 g/dL with a WBC count WNL.  Platelets are stable at 59,000.  Ferritin is stable at 92 as well.  Unfortunately, Kyle [name] reports that he has been feeling very tired and fatigued as of late.  He reports it began a few weeks ago.  He reports that it usually occurs the morning after taking his sleeping medication (Ambien).   He reports that 5 mg of Ambien does not help him with sleeping, but 10 mg helps him sleep, but he feels groggy.  I have recommended he take 7.5 mg at HS.  He explains that he does not take it often, only when needed.  He reports that he felt wonderful yesterday when he came in for blood work, but today he does feel poor and tired (but he took Ambien last night, 10 mg).    Since his Hgb is WNL and his ferritin is nearly 100, I will not give him IV Feraheme at this time.  I will continue to follow it along and provide him IV feraheme as indicated by lab results.  However, since he is feeling poorly today, I will perform an interval lab test to verify that he is maintaining his Hgb and ferritin levels.   Hematologically, he otherwise denies any  complaints and ROS questioning is negative.      Past Medical History  Diagnosis Date  . DM (diabetes mellitus)   . Cirrhosis     bx proven steatohepatitis with cirrhosis (2010); per note from Feb 2011, received Hep A and B vaccines in 2010  . HTN (hypertension)   . RLS (restless legs syndrome)   . Sleep apnea   . Hyperlipidemia   . IDA (iron deficiency anemia)   . GERD (gastroesophageal reflux disease)   . Depression   . Peripheral neuropathy   . Urothelial cancer     2010, paillary low-grade, h/o recurrence 2011  . B12 deficiency   . Psoriasis   . 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 Anemia due to multiple mechanisms 05/13/2011  . History of alcohol abuse 05/13/2011  . ANEMIA-IRON DEFICIENCY 03/09/2009  . S/P endoscopy May 2012    4 columns grade II esophageal varices; due for repeat in Nov 2013   . S/P colonoscopy May 2012    Tubular adenoma  . Low back pain   . Cancer     bladder ca 05/2009 removal and  w/chemo wash  . Esophageal varices with bleeding(456.0) 11/24/11    s/p emergent EGD 11/25/11 by Dr. Rhea Belton at [facility], Grade III esophageal varices s/p banding X 5  . Small bowel  obstruction 02/15/2012    Admitted to [facility], managed by Dr. Leticia Penna, ventral hernia manually reduced  . Ventral hernia 02/15/12  . MRSA (methicillin resistant Staphylococcus aureus)   . Acute post-ligation esophageal ulcer with hemorrhage 04/11/2012  . Sleep apnea   . Clostridium difficile infection   . 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  . H. pylori infection 12/24/2012    has BLADDER CANCER; DIABETES MELLITUS, TYPE II, CONTROLLED, WITH COMPLICATIONS; ADRENAL MASS; HYPERLIPIDEMIA; ANEMIA-IRON DEFICIENCY; ANEMIA, VITAMIN B12 DEFICIENCY; SMOKER; DEPRESSION; SLEEP APNEA,  OBSTRUCTIVE, MODERATE; RESTLESS LEG SYNDROME; HYPERTENSION; GERD; HEPATIC CIRRHOSIS; Other chronic nonalcoholic liver disease; ARTHRITIS; SHOULDER PAIN, LEFT; INSOMNIA; FATIGUE; WEIGHT GAIN; CHEST DISCOMFORT; PROTEINURIA; ABNORMAL ELECTROCARDIOGRAM; Thrombocytopenia due to hypersplenism; Anemia due to multiple mechanisms; History of alcohol abuse; Abdominal pain; External hemorrhoid; Upper GI bleed; Hypotension; Diarrhea; Esophageal varices; Umbilical hernia; History of small bowel obstruction; Acute post-ligation esophageal ulcer with hemorrhage; Acute posthemorrhagic anemia; Acute respiratory failure; UTI (urinary tract infection); Hyperkalemia; H. pylori infection; Microscopic hematuria; Hemorrhagic shock; Respiratory failure with hypercapnia; Aspiration pneumonia; SIRS (systemic inflammatory response syndrome); Seizure; and Fever, unspecified on his problem list.     is allergic to aspirin; ibuprofen; and tylenol.  Kyle [name] does not currently have medications on file.  Past Surgical History  Procedure Laterality Date  . Carpel tunnel    . Shoulder surgery    . Adrenal mass surgery  05/2009    benign, left  . Bladder surgery  01/2009 and 06/2010    cancer 2010, small recurrence in 06/2010  . Colonoscopy  04/2009    moderate int hemorrhoids, rare sigmoid diverticula, one mm sessile hyperplastic rectal polyp  . Esophagogastroduodenoscopy  03/2009    small hh  . Small bowel capsule endoscopy  03/2009    couple of small benign appearing erosions, nonbleeding  . Egd/tcs  08/2007    small hiatal hernia, pancolonic diverticula, friable anal canal, 3cm salmon colored epithelium in distal esophagus, bx negative for Barrett's  . Skin cancer excision  Oct 2012    left arm  . Esophagogastroduodenoscopy  11/25/2011    Dr. Vedia Coffer III varices in the mid and distal esophagus, banding placed, portal astropathy  . Esophagogastroduodenoscopy  01/14/2012    Dr Rourk->4-5 columns Gr2 varices, 6 bands placed,  HH, distal esophageal ulcer, portal gastropathy, antral erosions  . Esophagogastroduodenoscopy  03/17/2012    Dr. Rito Ehrlich varices status post band ligation. Hiatal hernia. Portal gastropathy.  . Umbilical hernia repair  04/05/2012    Procedure: HERNIA REPAIR UMBILICAL ADULT;  Surgeon: Fabio Bering, MD;  Location: [facility];  Service: General;  Laterality: N/A;  . Esophagogastroduodenoscopy  04/11/2012    Dr. Lysle Pearl ulcer possibly at previous banding sites with stigmata     of bleeding.  Attempt at hemostasis with hemoclip and banding was not successful resulting in recurrence of bleed/  Portal gastropathy, but no evidence of gastric varices/ Recurrent esophageal varices grade 2.  . Esophagogastroduodenoscopy  05/24/2012    Dr. Rito Ehrlich varices, portal gastropathy  . Esophagogastroduodenoscopy  04/11/12    Dr. Lorella Nimrod in the distal esophagus adherent lot and visible vessel treated with bicap. grade I varices in the distal esophagus, one ligating band placed, portal hypertensive gastropathy in the body of the stomach.  . Colonoscopy  09/29/2012    ZOX:WRUEAV varices. Scattered pancolonic  diverticula/single cecal polyp-removed as described above  . Esophagogastroduodenoscopy  12/24/2012    Dr. Norvel Richards significant upper GI bleed secondary to bleeding esophageal varices. s/p hemostasis therapy with injection of a total of 9mL of 5% ethanolamine and application of 9 bands, complete egd not carried out.  . Schlerotherapy  12/24/2012    Procedure: Theresia Majors OF VARICES;  Surgeon: Corbin Ade, MD;  Location: [facility];  Service: Endoscopy;  Laterality: N/A;  . Esophageal banding  12/24/2012    WUJ:[id number] significant upper GI bleed secondary to bleeding esophageal varices  . Tips procedure  12/2012    [facility]    Denies any headaches, dizziness, double vision, fevers, chills, night sweats, nausea, vomiting, diarrhea, constipation, chest pain, heart  palpitations, shortness of breath, blood in stool, black tarry stool, urinary pain, urinary burning, urinary frequency, hematuria.   PHYSICAL EXAMINATION  ECOG PERFORMANCE STATUS: 1 - Symptomatic but completely ambulatory  Filed Vitals:   07/29/13 1318  BP: 131/80  Pulse: 84  Temp: 98.3 F (36.8 C)  Resp: 18    GENERAL:alert, no distress, well nourished, well developed, comfortable, cooperative, ill looking, obese and smiling SKIN: skin color, texture, turgor are normal, no rashes or significant lesions HEAD: Normocephalic, No masses, lesions, tenderness or abnormalities EYES: normal, PERRLA, EOMI, Conjunctiva are pink and non-injected EARS: External ears normal OROPHARYNX:mucous membranes are moist  NECK: supple, no adenopathy, thyroid normal size, non-tender, without nodularity, no stridor, non-tender, trachea midline LYMPH:  no palpable lymphadenopathy BREAST:not examined LUNGS: clear to auscultation , decreased breath sounds, hyperresonant to percussion. HEART: regular rate & rhythm, no murmurs, no gallops, S1 normal and S2 normal ABDOMEN:abdomen soft, non-tender, obese, normal bowel sounds, no masses or organomegaly, truncal obesity inhibiting a solid abdominal exam, no hepatosplenomegaly but body habitus makes it difficult to assess completely. BACK: Back symmetric, no curvature. EXTREMITIES:less then 2 second capillary refill, no joint deformities, effusion, or inflammation, no edema, no skin discoloration, no clubbing, no cyanosis  NEURO: alert & oriented x 3 with fluent speech, no focal motor/sensory deficits, gait normal    LABORATORY DATA: CBC    Component Value Date/Time   WBC 4.4 07/28/2013 0955   RBC 4.51 07/28/2013 0955   RBC 3.97* 12/18/2010 1235   HGB 14.9 07/28/2013 0955   HCT 41.6 07/28/2013 0955   PLT 59* 07/28/2013 0955   MCV 92.2 07/28/2013 0955   MCH 33.0 07/28/2013 0955   MCHC 35.8 07/28/2013 0955   RDW 13.9 07/28/2013 0955   LYMPHSABS 1.3 07/28/2013 0955   MONOABS  0.5 07/28/2013 0955   EOSABS 0.1 07/28/2013 0955   BASOSABS 0.0 07/28/2013 0955    Lab Results  Component Value Date   FERRITIN 92 07/28/2013       ASSESSMENT:  1. Multifactorial anemia, last Feraheme 1020 IV infusion was on 03/24/2013.  2. Iron deficiency anemia requiring IV Feraheme infusions  3. Liver disease with fatty infiltration.  4. Hospitalization for seizure-like activity due to hyperglycemia in March 2014.  5. Recent Hospitalization from severe upper esophageal bleeding requiring multiple PRBC transfusions, intubation, and transfer to [facility] for TIPS procedure which was performed on 01/17/2013. He was in ICU at [facility] for over 20 days.  6. Obesity  7. Probably lung disease secondary to longstanding smoking history.  8. DM  9. Esophageal varices  10. RLS, failed Tramadol and Gabapentin.  11. Insomnia, on ambien and much improved.   Patient Active Problem List   Diagnosis Date Noted  . Fever, unspecified 02/05/2013  .  Seizure 02/03/2013  . Aspiration pneumonia 12/25/2012  . SIRS (systemic inflammatory response syndrome) 12/25/2012  . UTI (urinary tract infection) 12/24/2012  . Hyperkalemia 12/24/2012  . H. pylori infection 12/24/2012  . Microscopic hematuria 12/24/2012  . Hemorrhagic shock 12/24/2012  . Respiratory failure with hypercapnia 12/24/2012  . Acute post-ligation esophageal ulcer with hemorrhage 04/11/2012  . Acute posthemorrhagic anemia 04/11/2012  . Acute respiratory failure 04/11/2012  . Umbilical hernia 03/03/2012  . History of small bowel obstruction 03/03/2012  . Diarrhea 12/19/2011  . Esophageal varices 12/19/2011  . Upper GI bleed 11/25/2011  . Hypotension 11/25/2011  . Abdominal pain 11/04/2011  . External hemorrhoid 11/04/2011  . 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 Anemia due to multiple mechanisms 05/13/2011  . History of alcohol abuse 05/13/2011  . Other chronic nonalcoholic liver disease 01/02/2010  . DIABETES MELLITUS, TYPE II,  CONTROLLED, WITH COMPLICATIONS 07/23/2009  . BLADDER CANCER 05/21/2009  . HEPATIC CIRRHOSIS 05/21/2009  . SHOULDER PAIN, LEFT 04/19/2009  . SMOKER 04/04/2009  . GERD 04/04/2009  . ARTHRITIS 04/04/2009  . ANEMIA-IRON DEFICIENCY 03/09/2009  . ANEMIA, VITAMIN B12 DEFICIENCY 03/09/2009  . CHEST DISCOMFORT 02/28/2009  . ADRENAL MASS 12/27/2008  . INSOMNIA 12/27/2008  . RESTLESS LEG SYNDROME 12/13/2008  . SLEEP APNEA, OBSTRUCTIVE, MODERATE 10/25/2008  . PROTEINURIA 10/06/2008  . HYPERLIPIDEMIA 10/04/2008  . DEPRESSION 10/04/2008  . HYPERTENSION 10/04/2008  . FATIGUE 10/04/2008  . WEIGHT GAIN 10/04/2008  . ABNORMAL ELECTROCARDIOGRAM 10/04/2008     PLAN:  1. I personally reviewed and went over laboratory results with the patient.  2. Follow-up with GI as directed  3. Smoking cessation education provided  4. Feraheme IV PRN per lab results  5. Labs every 6 weeks: CBC diff, Ferritin  6. Labs in 3 weeks: CBC diff, Iron/TIBC, Ferritin 7. Recommend taking 7.5 mg of Ambien at HS PRN insomnia. 8. Return in 12 weeks for follow-up   THERAPY PLAN:  Will continue to monitor labs and administer IV Feraheme.  All questions were answered. The patient knows to call the clinic with any problems, questions or concerns. We can certainly see the patient much sooner if necessary.  Patient and plan discussed with Dr. Erline Hau and he is in agreement with the aforementioned.   Joban Colledge

## 2013-07-29 NOTE — Telephone Encounter (Signed)
Per Dr. Jena Gauss cam call in Xifaxin 550 mg #60 bid with # refills. Called to [place] at [facility] in [place].

## 2013-07-29 NOTE — Progress Notes (Signed)
Primary Care Physician:  Frazier Richards, PA-C Primary Gastroenterologist:  Dr. Jena Gauss  Pre-Procedure History & Physical: HPI:  Kyle [name] is a 57 y.o. male here for followup of [name]/cirrhosis. Complains of being fatigued otherwise he's done very well  -  taking 5 cc of lactulose only once daily along with Xifaxin. Has 1-2 semi-formed bowel movements daily. Has not had any episodes of confusion. No bleeding. Has a make down to [facility] last office visit here. Unsure where we stand with transplant evaluation. He is due for a screening ultrasound in one month. He is to see hematology clinic folks in the near future. He has gained 4 pounds since his last office visit.  Past Medical History  Diagnosis Date  . DM (diabetes mellitus)   . Cirrhosis     bx proven steatohepatitis with cirrhosis (2010); per note from Feb 2011, received Hep A and B vaccines in 2010  . HTN (hypertension)   . RLS (restless legs syndrome)   . Sleep apnea   . Hyperlipidemia   . IDA (iron deficiency anemia)   . GERD (gastroesophageal reflux disease)   . Depression   . Peripheral neuropathy   . Urothelial cancer     2010, paillary low-grade, h/o recurrence 2011  . B12 deficiency   . Psoriasis   . 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 Anemia due to multiple mechanisms 05/13/2011  . History of alcohol abuse 05/13/2011  . ANEMIA-IRON DEFICIENCY 03/09/2009  . S/P endoscopy May 2012    4 columns grade II esophageal varices; due for repeat in Nov 2013   . S/P colonoscopy May 2012    Tubular adenoma  . Low back pain   . Cancer     bladder ca 05/2009 removal and  w/chemo wash  . Esophageal varices with bleeding(456.0) 11/24/11    s/p emergent EGD 11/25/11 by Dr. Rhea Belton at [facility], Grade III esophageal varices s/p banding X 5  . Small bowel obstruction 02/15/2012    Admitted to [facility], managed by Dr. Leticia Penna, ventral hernia manually reduced  . Ventral hernia 02/15/12  . MRSA (methicillin resistant Staphylococcus aureus)    . Acute post-ligation esophageal ulcer with hemorrhage 04/11/2012  . Sleep apnea   . Clostridium difficile infection   . 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  . H. pylori infection 12/24/2012    Past Surgical History  Procedure Laterality Date  . Carpel tunnel    . Shoulder surgery    . Adrenal mass surgery  05/2009    benign, left  . Bladder surgery  01/2009 and 06/2010    cancer 2010, small recurrence in 06/2010  . Colonoscopy  04/2009    moderate int hemorrhoids, rare sigmoid diverticula, one mm sessile hyperplastic rectal polyp  . Esophagogastroduodenoscopy  03/2009    small hh  . Small bowel capsule endoscopy  03/2009    couple of small benign appearing erosions, nonbleeding  . Egd/tcs  08/2007    small hiatal hernia, pancolonic diverticula, friable anal canal, 3cm salmon colored epithelium in distal esophagus, bx negative for Barrett's  . Skin cancer excision  Oct 2012    left arm  . Esophagogastroduodenoscopy  11/25/2011    Dr. Vedia Coffer III varices in the mid and distal esophagus, banding placed, portal astropathy  . Esophagogastroduodenoscopy  01/14/2012  Dr Hermie Reagor->4-5 columns Gr2 varices, 6 bands placed, HH, distal esophageal ulcer, portal gastropathy, antral erosions  . Esophagogastroduodenoscopy  03/17/2012    Dr. Rito Ehrlich varices status post band ligation. Hiatal hernia. Portal gastropathy.  . Umbilical hernia repair  04/05/2012    Procedure: HERNIA REPAIR UMBILICAL ADULT;  Surgeon: Fabio Bering, MD;  Location: [facility]S;  Service: General;  Laterality: N/A;  . Esophagogastroduodenoscopy  04/11/2012    Dr. Lysle Pearl ulcer possibly at previous banding sites with stigmata     of bleeding.  Attempt at hemostasis with hemoclip and banding was not  successful resulting in recurrence of bleed/  Portal gastropathy, but no evidence of gastric varices/ Recurrent esophageal varices grade 2.  . Esophagogastroduodenoscopy  05/24/2012    Dr. Rito Ehrlich varices, portal gastropathy  . Esophagogastroduodenoscopy  04/11/12    Dr. Lorella Nimrod in the distal esophagus adherent lot and visible vessel treated with bicap. grade I varices in the distal esophagus, one ligating band placed, portal hypertensive gastropathy in the body of the stomach.  . Colonoscopy  09/29/2012    ZHY:[id number] varices. Scattered pancolonic diverticula/single cecal polyp-removed as described above  . Esophagogastroduodenoscopy  12/24/2012    Dr. Norvel Richards significant upper GI bleed secondary to bleeding esophageal varices. s/p hemostasis therapy with injection of a total of 9mL of 5% ethanolamine and application of 9 bands, complete egd not carried out.  . Schlerotherapy  12/24/2012    Procedure: Theresia Majors OF VARICES;  Surgeon: Corbin Ade, MD;  Location: [facility];  Service: Endoscopy;  Laterality: N/A;  . Esophageal banding  12/24/2012    ONG:[id number] significant upper GI bleed secondary to bleeding esophageal varices  . Tips procedure  12/2012    [facility]    Prior to Admission medications   Medication Sig Start Date End Date Taking? Authorizing Provider  carvedilol (COREG) 6.25 MG tablet Take 1 tablet (6.25 mg total) by mouth 2 (two) times daily with a meal. 04/25/13  Yes Dorena Bodo, PA-C  gabapentin (NEURONTIN) 300 MG capsule Take 300-900 mg by mouth 4 (four) times daily.    Yes Historical Provider, MD  insulin regular (NOVOLIN R,HUMULIN R) 100 units/mL injection Inject 22 Units into the skin 3 (three) times daily before meals.    Yes Historical Provider, MD  lactulose (CHRONULAC) 10 GM/15ML solution Take 5 g by mouth daily.  03/23/13  Yes Tiffany Kocher, PA-C  metFORMIN (GLUCOPHAGE) 1000 MG tablet Take 1 tablet (1,000 mg total) by mouth 2 (two) times  daily with a meal. 07/28/13  Yes Dorena Bodo, PA-C  omeprazole (PRILOSEC) 40 MG capsule Take 1 capsule (40 mg total) by mouth daily. 12/17/12  Yes Joselyn Arrow, NP  rifaximin (XIFAXAN) 550 MG TABS Take 1 tablet (550 mg total) by mouth 2 (two) times daily. 02/21/13  Yes Corbin Ade, MD  zolpidem (AMBIEN) 5 MG tablet TAKE ONE TO TWO TABLETS BY MOUTH AT BEDTIME AS NEEDED FOR SLEEP 04/21/13  Yes Ellouise Newer, PA-C  HYDROcodone-acetaminophen (NORCO/VICODIN) 5-325 MG per tablet Take 1 tablet by mouth every 6 (six) hours as needed for pain. 05/02/13   Dorena Bodo, PA-C    Allergies as of 07/29/2013 - Review Complete 07/29/2013  Allergen Reaction Noted  . Aspirin  12/23/2012  . Ibuprofen Other (See Comments) 05/13/2012  . Tylenol [acetaminophen] Other (See Comments) 07/17/2011    Family History  Problem Relation Age of Onset  . Cirrhosis Father     etoh  . Colon cancer Neg Hx   .  Anesthesia problems Neg Hx   . Hypotension Neg Hx   . Malignant hyperthermia Neg Hx   . Pseudochol deficiency Neg Hx   . Kidney cancer Mother   . Cancer Mother   . HIV Brother   . Cirrhosis Brother     nash    History   Social History  . Marital Status: Married    Spouse Name: N/A    Number of Children: 3  . Years of Education: N/A   Occupational History  . disabled    Social History Main Topics  . Smoking status: Current Every Day Smoker -- 1.50 packs/day for 30 years    Types: Cigarettes  . Smokeless tobacco: Never Used  . Alcohol Use: No     Comment: drank heavily for few years in 20s  . Drug Use: No  . Sexual Activity: Yes    Birth Control/ Protection: None   Other Topics Concern  . Not on file   Social History Narrative  . No narrative on file    Review of Systems: See HPI, otherwise negative ROS  Physical Exam: BP 140/82  Pulse 80  Temp(Src) 98.2 F (36.8 C) (Oral)  Ht 5\' 8"  (1.727 m)  Wt 243 lb (110.224 kg)  BMI 36.96 kg/m2 General:   Alert,  Well-developed, somewhat  chronically ill gentleman. Jovial and in no acute distress. Well oriented. No asterixis.  Skin:  Intact without significant lesions or rashes. Eyes:  Sclera clear, no icterus.   Conjunctiva pink. Ears:  Normal auditory acuity. Nose:  No deformity, discharge,  or lesions. Mouth:  No deformity or lesions. Neck:  Supple; no masses or thyromegaly. No significant cervical adenopathy. Lungs:  Clear throughout to auscultation.   No wheezes, crackles, or rhonchi. No acute distress. Heart:  Regular rate and rhythm; no murmurs, clicks, rubs,  or gallops. Abdomen: Full/obese normal bowel sounds.  Soft and nontender without appreciable mass or hepatosplenomegaly. No shifting dullness or fluid wave Pulses:  Normal pulses noted. Extremities:  Without clubbing or edema.  Impression/Plan:  57 year-old with [name]/cirrhosis/status post TIPS - PERRLA well compensated at this time. Not mentioned above, his ferritin was recently normal as was his CBC (except for platelet count 50,000 range) he is on nearly a homeopathic dose of lactulose. He really needs to be all higher dose although he easily gets some excessive diarrhea when the dose is pushed upwards.  Recommendations:  Minimal upward bump in lactulose (5 cc twice a day) continue Xifaxan. For screening ultrasound next month. Office visit here in 3 months. We'll plan to get him back down to the transplant clinic at [facility] the first of the year.

## 2013-07-29 NOTE — Telephone Encounter (Signed)
Agree. Thanks

## 2013-07-29 NOTE — Telephone Encounter (Signed)
Routing to Dr. Rourk  

## 2013-07-29 NOTE — Telephone Encounter (Signed)
Pt came to office window to see if [facility] would call in his Xifaxan prescription to [facility] pharmacy. He couldn't remember the name of the medicine and I had the nurse DS come up front to help find out which medicine he was referring to.

## 2013-07-29 NOTE — Patient Instructions (Addendum)
Continue Xifaxin  Increase lactulose to 5 cc twice daily  F/U with Hematology clinic  Screening liver ultrasound October 2014  Schedule a [facility] liver transplant clinic evaluation in Jan/Feb 2015  Office visit her in 3 months

## 2013-08-01 ENCOUNTER — Other Ambulatory Visit: Payer: Self-pay

## 2013-08-01 MED ORDER — RIFAXIMIN 550 MG PO TABS: 550.0000 mg | ORAL_TABLET | Freq: Two times a day (BID) | ORAL | Status: DC

## 2013-08-10 ENCOUNTER — Encounter: Payer: Self-pay | Admitting: Physician Assistant

## 2013-08-10 ENCOUNTER — Ambulatory Visit (INDEPENDENT_AMBULATORY_CARE_PROVIDER_SITE_OTHER): Payer: Medicaid Other | Admitting: Physician Assistant

## 2013-08-10 VITALS — BP 120/74 | HR 78 | Temp 98.0°F | Resp 16 | Ht 67.0 in | Wt 245.0 lb

## 2013-08-10 DIAGNOSIS — K429 Umbilical hernia without obstruction or gangrene: Secondary | ICD-10-CM

## 2013-08-10 DIAGNOSIS — Z23 Encounter for immunization: Secondary | ICD-10-CM

## 2013-08-10 NOTE — Progress Notes (Signed)
Patient ID: SRIHAN BRUTUS MRN: [id number], DOB: 1955/11/30, 57 y.o. Date of Encounter: 08/10/2013, 7:38 PM    Chief Complaint:  Chief Complaint  Patient presents with  . Medication Refill  . Hernia    [facility] referral - Dr. Leticia Penna - [phone]) [address] [place] [address]     HPI: 57 y.o. year old white male who has an extremely extensive past medical history--all documented under the problem list.  Among his many medical problems and prior surgeries and procedures he has had abdominal hernia repairs in the past. Today he reports that he has recently noticed a newly developed abdominal hernia just above the level of the umbilicus. He says at times he can feel a firm mass there and at times it is tender. However no signs or symptoms of strangulation or incarceration . He needs a referral to followup with the surgeon who performed his prior hernia repairs.  No other complaints today. He sees the following physicians on a routine basis: Dr. Sharl Ma for diabetes Dr. Benard Rink for GI Dr. Laverle Patter for urology  Dr. Jerelyn Scott Oncology   Home Meds: See attached medication section for any medications that were entered at today's visit. The computer does not put those onto this list.The following list is a list of meds entered prior to today's visit.   Current Outpatient Prescriptions on File Prior to Visit  Medication Sig Dispense Refill  . carvedilol (COREG) 6.25 MG tablet Take 1 tablet (6.25 mg total) by mouth 2 (two) times daily with a meal.  60 tablet  5  . gabapentin (NEURONTIN) 300 MG capsule Take 300-900 mg by mouth 4 (four) times daily.       . insulin regular (NOVOLIN R,HUMULIN R) 100 units/mL injection Inject 22 Units into the skin 3 (three) times daily before meals.       . lactulose (CHRONULAC) 10 GM/15ML solution Take 5 g by mouth daily.       . metFORMIN (GLUCOPHAGE) 1000 MG tablet Take 1 tablet (1,000 mg total) by mouth 2 (two) times daily with a meal.  60 tablet  1  .  omeprazole (PRILOSEC) 40 MG capsule Take 1 capsule (40 mg total) by mouth daily.  30 capsule  11  . rifaximin (XIFAXAN) 550 MG TABS tablet Take 1 tablet (550 mg total) by mouth 2 (two) times daily.  60 tablet  11  . zolpidem (AMBIEN) 5 MG tablet TAKE ONE TO TWO TABLETS BY MOUTH AT BEDTIME AS NEEDED FOR SLEEP  60 tablet  2   No current facility-administered medications on file prior to visit.    Allergies:  Allergies  Allergen Reactions  . Aspirin   . Ibuprofen Other (See Comments)    Can't take per Dr Jena Gauss- harms livers  . Tylenol [Acetaminophen] Other (See Comments)    Causes legs to "run"      Review of Systems: See HPI for pertinent ROS. All other ROS negative.    Physical Exam: Blood pressure 120/74, pulse 78, temperature 98 F (36.7 C), temperature source Oral, resp. rate 16, height 5\' 7"  (1.702 m), weight 245 lb (111.131 kg)., Body mass index is 38.36 kg/(m^2). General:  well-nourished well-developed white male . Appears in no acute distress. Neck: Supple. No thyromegaly. No lymphadenopathy. Lungs: Clear bilaterally to auscultation without wheezes, rales, or rhonchi. Breathing is unlabored. Heart: Regular rhythm. No murmurs, rubs, or gallops. Abdomen: Soft. No rebound/guarding.there are areas of prior scars from prior surgeries . I had the patient slowly went  back to supine and then slowly sit up as inspected and then later palpated in the area of his reported hernia. I can see and palpate a small protrusion in the area. It is easily reducible. No strangulation or incarceration.  Msk:  Strength and tone normal for age. Extremities/Skin: Warm and dry. No clubbing or cyanosis. No edema. No rashes or suspicious lesions. Neuro: Alert and oriented X 3. Moves all extremities spontaneously. Gait is normal. CNII-XII grossly in tact. Psych:  Responds to questions appropriately with a normal affect.     ASSESSMENT AND PLAN:  57 y.o. year old male with  1. Umbilical  hernia Discussed with him signs and symptoms of strangulation, incarceration. Follow up immediately if develops these. Otherwise followup with Gen. surgery as referred - Ambulatory referral to General Surgery  2. Need for prophylactic vaccination and inoculation against influenza - Flu Vaccine QUAD 36+ mos IM   Signed, [address] [place], [place], [facility] 08/10/2013 7:38 PM

## 2013-08-16 LAB — LIPID PANEL
Cholesterol: 194 mg/dL (ref 0–200)
HDL: 35 mg/dL (ref 35–70)
LDL CALC: 125 mg/dL
LDL/HDL RATIO: 5.5
Triglycerides: 170 mg/dL — AB (ref 40–160)

## 2013-08-16 LAB — HEMOGLOBIN A1C: Hgb A1c MFr Bld: 8.9 % — AB (ref 4.0–6.0)

## 2013-08-16 LAB — TSH: TSH: 2.75 u[IU]/mL (ref 0.41–5.90)

## 2013-08-19 ENCOUNTER — Encounter (HOSPITAL_BASED_OUTPATIENT_CLINIC_OR_DEPARTMENT_OTHER): Payer: Medicaid Other

## 2013-08-19 DIAGNOSIS — D509 Iron deficiency anemia, unspecified: Secondary | ICD-10-CM

## 2013-08-19 LAB — CBC WITH DIFFERENTIAL/PLATELET
Basophils Absolute: 0 10*3/uL (ref 0.0–0.1)
Eosinophils Absolute: 0.1 10*3/uL (ref 0.0–0.7)
Hemoglobin: 14.6 g/dL (ref 13.0–17.0)
Lymphs Abs: 1.2 10*3/uL (ref 0.7–4.0)
MCH: 32.4 pg (ref 26.0–34.0)
Monocytes Relative: 9 % (ref 3–12)
Neutrophils Relative %: 60 % (ref 43–77)
RBC: 4.5 MIL/uL (ref 4.22–5.81)
Smear Review: DECREASED

## 2013-08-19 NOTE — Progress Notes (Signed)
Labs drawn today for cbc/diff,ferr,Iron and IBC 

## 2013-08-24 ENCOUNTER — Other Ambulatory Visit: Payer: Self-pay | Admitting: Family Medicine

## 2013-08-25 ENCOUNTER — Other Ambulatory Visit (HOSPITAL_COMMUNITY): Payer: Self-pay | Admitting: Oncology

## 2013-08-25 ENCOUNTER — Other Ambulatory Visit: Payer: Self-pay | Admitting: Internal Medicine

## 2013-08-25 ENCOUNTER — Encounter (HOSPITAL_COMMUNITY): Payer: Self-pay | Admitting: Pharmacy Technician

## 2013-08-25 DIAGNOSIS — K7460 Unspecified cirrhosis of liver: Secondary | ICD-10-CM

## 2013-08-25 DIAGNOSIS — G4700 Insomnia, unspecified: Secondary | ICD-10-CM

## 2013-08-25 MED ORDER — ZOLPIDEM TARTRATE 5 MG PO TABS: 5.0000 mg | ORAL_TABLET | Freq: Every evening | ORAL | Status: DC | PRN

## 2013-08-25 NOTE — Patient Instructions (Addendum)
Your procedure is scheduled on: 08/31/2013  Report to [facility] at   7:00  AM.  Call this number if you have problems the morning of surgery: [phone]   Remember:   Do not drink or eat food:After Midnight.  :  Take these medicines the morning of surgery with A SIP OF WATER: Coreg, omeprazole   Do not wear jewelry, make-up or nail polish.  Do not wear lotions, powders, or perfumes. You may wear deodorant.  Do not shave 48 hours prior to surgery. Men may shave face and neck.  Do not bring valuables to the hospital.  Contacts, dentures or bridgework may not be worn into surgery.  Leave suitcase in the car. After surgery it may be brought to your room.  For patients admitted to the hospital, checkout time is 11:00 AM the day of discharge.   Patients discharged the day of surgery will not be allowed to drive home.    Special Instructions: Shower using CHG 2 nights before surgery and the night before surgery.  If you shower the day of surgery use CHG.  Use special wash - you have one bottle of CHG for all showers.  You should use approximately 1/3 of the bottle for each shower.   Please read over the following fact sheets that you were given: Pain Booklet, MRSA Information, Surgical Site Infection Prevention and Care and Recovery After Surgery  Hernia, Surgical Repair A hernia occurs when an internal organ pushes out through a weak spot in the belly (abdominal) wall muscles. Hernias commonly occur in the groin and around the navel. Hernias often can be pushed back into place (reduced). Most hernias tend to get worse over time. Problems occur when abdominal contents get stuck in the opening (incarcerated hernia). The blood supply gets cut off (strangulated hernia). This is an emergency and needs surgery. Otherwise, hernia repair can be an elective procedure. This means you can schedule this at your convenience when an emergency is not present. Because complications can occur, if you decide to  repair the hernia, it is best to do it soon. When it becomes an emergency procedure, there is increased risk of complications after surgery. CAUSES   Heavy lifting.  Obesity.  Prolonged coughing.  Straining to move your bowels.  Hernias can also occur through a cut (incision) by a surgeonafter an abdominal operation. HOME CARE INSTRUCTIONS Before the repair:  Bed rest is not required. You may continue your normal activities, but avoid heavy lifting (more than 10 pounds) or straining. Cough gently. If you are a smoker, it is best to stop. Even the best hernia repair can break down with the continual strain of coughing.  Do not wear anything tight over your hernia. Do not try to keep it in with an outside bandage or truss. These can damage abdominal contents if they are trapped in the hernia sac.  Eat a normal diet. Avoid constipation. Straining over long periods of time to have a bowel movement will increase hernia size. It also can breakdown repairs. If you cannot do this with diet alone, laxatives or stool softeners may be used. PRIOR TO SURGERY, SEEK IMMEDIATE MEDICAL CARE IF: You have problems (symptoms) of a trapped (incarcerated) hernia. Symptoms include:  An oral temperature above 102 F (38.9 C) develops, or as your caregiver suggests.  Increasing abdominal pain.  Feeling sick to your stomach(nausea) and vomiting.  You stop passing gas or stool.  The hernia is stuck outside the abdomen, looks discolored,  feels hard, or is tender.  You have any changes in your bowel habits or in the hernia that is unusual for you. LET YOUR CAREGIVERS KNOW ABOUT THE FOLLOWING:  Allergies.  Medications taken including herbs, eye drops, over the counter medications, and creams.  Use of steroids (by mouth or creams).  Family or personal history of problems with anesthetics or Novocaine.  Possibility of pregnancy, if this applies.  Personal history of blood clots  (thrombophlebitis).  Family or personal history of bleeding or blood problems.  Previous surgery.  Other health problems. BEFORE THE PROCEDURE You should be present 1 hour prior to your procedure, or as directed by your caregiver.  AFTER THE PROCEDURE After surgery, you will be taken to the recovery area. A nurse will watch and check your progress there. Once you are awake, stable, and taking fluids well, you will be allowed to go home as long as there are no problems. Once home, an ice pack (wrapped in a light towel) applied to your operative site may help with discomfort. It may also keep the swelling down. Do not lift anything heavier than 10 pounds (4.55 kilograms). Take showers not baths. Do not drive while taking narcotics. Follow instructions as suggested by your caregiver.  SEEK IMMEDIATE MEDICAL CARE IF: After surgery:  There is redness, swelling, or increasing pain in the wound.  There is pus coming from the wound.  There is drainage from a wound lasting longer than 1 day.  An unexplained oral temperature above 102 F (38.9 C) develops.  You notice a foul smell coming from the wound or dressing.  There is a breaking open of a wound (edged not staying together) after the sutures have been removed.  You notice increasing pain in the shoulders (shoulder strap areas).  You develop dizzy episodes or fainting while standing.  You develop persistent nausea or vomiting.  You develop a rash.  You have difficulty breathing.  You develop any reaction or side effects to medications given. MAKE SURE YOU:   Understand these instructions.  Will watch your condition.  Will get help right away if you are not doing well or get worse. Document Released: 05/06/2001 Document Revised: 02/02/2012 Document Reviewed: 03/28/2008 [facility] Patient Information 2014 [place], [place]. PATIENT INSTRUCTIONS POST-ANESTHESIA  IMMEDIATELY FOLLOWING SURGERY:  Do not drive or operate machinery  for the first twenty four hours after surgery.  Do not make any important decisions for twenty four hours after surgery or while taking narcotic pain medications or sedatives.  If you develop intractable nausea and vomiting or a severe headache please notify your doctor immediately.  FOLLOW-UP:  Please make an appointment with your surgeon as instructed. You do not need to follow up with anesthesia unless specifically instructed to do so.  WOUND CARE INSTRUCTIONS (if applicable):  Keep a dry clean dressing on the anesthesia/puncture wound site if there is drainage.  Once the wound has quit draining you may leave it open to air.  Generally you should leave the bandage intact for twenty four hours unless there is drainage.  If the epidural site drains for more than 36-48 hours please call the anesthesia department.  QUESTIONS?:  Please feel free to call your physician or the hospital operator if you have any questions, and they will be happy to assist you.

## 2013-08-26 ENCOUNTER — Encounter (HOSPITAL_COMMUNITY)
Admission: RE | Admit: 2013-08-26 | Discharge: 2013-08-26 | Disposition: A | Discharge status: Home / Self Care | Payer: Medicaid Other | Source: Ambulatory Visit | Attending: General Surgery | Admitting: General Surgery

## 2013-08-26 ENCOUNTER — Encounter (HOSPITAL_COMMUNITY): Payer: Self-pay

## 2013-08-26 DIAGNOSIS — Z01818 Encounter for other preprocedural examination: Secondary | ICD-10-CM | POA: Insufficient documentation

## 2013-08-26 DIAGNOSIS — Z01810 Encounter for preprocedural cardiovascular examination: Secondary | ICD-10-CM | POA: Insufficient documentation

## 2013-08-26 DIAGNOSIS — Z01812 Encounter for preprocedural laboratory examination: Secondary | ICD-10-CM | POA: Insufficient documentation

## 2013-08-26 LAB — COMPREHENSIVE METABOLIC PANEL
ALT: 28 U/L (ref 0–53)
AST: 52 U/L — ABNORMAL HIGH (ref 0–37)
Alkaline Phosphatase: 126 U/L — ABNORMAL HIGH (ref 39–117)
CO2: 26 mEq/L (ref 19–32)
Calcium: 9.6 mg/dL (ref 8.4–10.5)
GFR calc non Af Amer: 82 mL/min — ABNORMAL LOW (ref >90)
Glucose, Bld: 308 mg/dL — ABNORMAL HIGH (ref 70–99)
Potassium: 4.1 mEq/L (ref 3.5–5.1)
Sodium: 135 mEq/L (ref 135–145)
Total Protein: 7.4 g/dL (ref 6.0–8.3)

## 2013-08-26 LAB — CBC WITH DIFFERENTIAL/PLATELET
Basophils Absolute: 0 10*3/uL (ref 0.0–0.1)
Basophils Relative: 0 % (ref 0–1)
Eosinophils Absolute: 0.1 10*3/uL (ref 0.0–0.7)
HCT: 40.2 % (ref 39.0–52.0)
Hemoglobin: 14.1 g/dL (ref 13.0–17.0)
Lymphocytes Relative: 30 % (ref 12–46)
Lymphs Abs: 1.1 10*3/uL (ref 0.7–4.0)
MCH: 32.3 pg (ref 26.0–34.0)
MCHC: 35.1 g/dL (ref 30.0–36.0)
Monocytes Relative: 11 % (ref 3–12)
Neutro Abs: 2.1 10*3/uL (ref 1.7–7.7)
Neutrophils Relative %: 56 % (ref 43–77)
Platelets: 59 10*3/uL — ABNORMAL LOW (ref 150–400)
RBC: 4.37 MIL/uL (ref 4.22–5.81)
Smear Review: DECREASED
WBC: 3.8 10*3/uL — ABNORMAL LOW (ref 4.0–10.5)

## 2013-08-29 LAB — PLATELET COUNT: Platelets: 67 10*3/uL — ABNORMAL LOW (ref 150–400)

## 2013-08-29 LAB — PROTIME-INR: INR: 1.23 (ref 0.00–1.49)

## 2013-08-29 NOTE — Pre-Procedure Instructions (Signed)
Dr Leticia Penna notified on platelets count. Patient to come in today for type and screen, pt/ptt-INR. Dr Leticia Penna asked that patient be notified that he will be admited on 08/30/2013 for platelets propr to surgery and will not go home on 08/31/2013, day of surgery. Patient verabalized understanding of all of this. In for blood work.

## 2013-08-29 NOTE — Pre-Procedure Instructions (Signed)
Glucose, history and platelet count discussed with Dr Jayme Cloud, will notify Dr Leticia Penna.

## 2013-08-30 ENCOUNTER — Observation Stay (HOSPITAL_COMMUNITY)
Admission: RE | Admit: 2013-08-30 | Discharge: 2013-08-31 | DRG: 813 | Disposition: A | Discharge status: Home / Self Care | Payer: Medicaid Other | Source: Ambulatory Visit | Attending: General Surgery | Admitting: General Surgery

## 2013-08-30 ENCOUNTER — Encounter (HOSPITAL_COMMUNITY): Payer: Self-pay

## 2013-08-30 DIAGNOSIS — D6959 Other secondary thrombocytopenia: Principal | ICD-10-CM | POA: Diagnosis present

## 2013-08-30 DIAGNOSIS — G4730 Sleep apnea, unspecified: Secondary | ICD-10-CM | POA: Diagnosis present

## 2013-08-30 DIAGNOSIS — D649 Anemia, unspecified: Secondary | ICD-10-CM | POA: Diagnosis present

## 2013-08-30 DIAGNOSIS — G2581 Restless legs syndrome: Secondary | ICD-10-CM | POA: Diagnosis present

## 2013-08-30 DIAGNOSIS — F17200 Nicotine dependence, unspecified, uncomplicated: Secondary | ICD-10-CM | POA: Diagnosis present

## 2013-08-30 DIAGNOSIS — K7460 Unspecified cirrhosis of liver: Secondary | ICD-10-CM | POA: Diagnosis present

## 2013-08-30 DIAGNOSIS — E119 Type 2 diabetes mellitus without complications: Secondary | ICD-10-CM | POA: Diagnosis present

## 2013-08-30 DIAGNOSIS — I10 Essential (primary) hypertension: Secondary | ICD-10-CM | POA: Diagnosis present

## 2013-08-30 DIAGNOSIS — E785 Hyperlipidemia, unspecified: Secondary | ICD-10-CM | POA: Diagnosis present

## 2013-08-30 DIAGNOSIS — K432 Incisional hernia without obstruction or gangrene: Secondary | ICD-10-CM | POA: Diagnosis present

## 2013-08-30 DIAGNOSIS — Z85828 Personal history of other malignant neoplasm of skin: Secondary | ICD-10-CM

## 2013-08-30 DIAGNOSIS — Z7682 Awaiting organ transplant status: Secondary | ICD-10-CM

## 2013-08-30 DIAGNOSIS — F1010 Alcohol abuse, uncomplicated: Secondary | ICD-10-CM | POA: Diagnosis present

## 2013-08-30 DIAGNOSIS — K219 Gastro-esophageal reflux disease without esophagitis: Secondary | ICD-10-CM | POA: Diagnosis present

## 2013-08-30 DIAGNOSIS — C689 Malignant neoplasm of urinary organ, unspecified: Secondary | ICD-10-CM | POA: Diagnosis present

## 2013-08-30 LAB — SURGICAL PCR SCREEN
MRSA, PCR: NEGATIVE
Staphylococcus aureus: NEGATIVE

## 2013-08-30 MED ORDER — CEFAZOLIN SODIUM 1-5 GM-% IV SOLN
1.0000 g | INTRAVENOUS | Status: DC
  Filled 2013-08-30: qty 50

## 2013-08-30 MED ORDER — PANTOPRAZOLE SODIUM 40 MG IV SOLR
40.0000 mg | Freq: Every day | INTRAVENOUS | Status: DC
  Administered 2013-08-30: 40 mg via INTRAVENOUS
  Filled 2013-08-30: qty 40

## 2013-08-30 MED ORDER — LACTATED RINGERS IV SOLN
INTRAVENOUS | Status: DC
  Administered 2013-08-30: 19:00:00 via INTRAVENOUS

## 2013-08-30 MED ORDER — CHLORHEXIDINE GLUCONATE 4 % EX LIQD: 1.0000 "application " | Freq: Once | CUTANEOUS | Status: DC

## 2013-08-30 MED ORDER — CEFAZOLIN SODIUM-DEXTROSE 2-3 GM-% IV SOLR: 2.0000 g | INTRAVENOUS | Status: DC

## 2013-08-30 MED ORDER — ONDANSETRON HCL 4 MG/2ML IJ SOLN: 4.0000 mg | Freq: Four times a day (QID) | INTRAMUSCULAR | Status: DC | PRN

## 2013-08-31 ENCOUNTER — Encounter (HOSPITAL_COMMUNITY)
Admission: RE | Disposition: A | Discharge status: Home / Self Care | Payer: Self-pay | Source: Ambulatory Visit | Attending: General Surgery

## 2013-08-31 ENCOUNTER — Encounter (HOSPITAL_COMMUNITY): Payer: Self-pay | Admitting: *Deleted

## 2013-08-31 LAB — PREPARE PLATELET PHERESIS
Unit division: 0
Unit division: 0

## 2013-08-31 LAB — BASIC METABOLIC PANEL
CO2: 25 mEq/L (ref 19–32)
Creatinine, Ser: 0.92 mg/dL (ref 0.50–1.35)
GFR calc Af Amer: >90 mL/min (ref >90)
GFR calc non Af Amer: >90 mL/min (ref >90)
Glucose, Bld: 311 mg/dL — ABNORMAL HIGH (ref 70–99)
Potassium: 4.2 mEq/L (ref 3.5–5.1)
Sodium: 137 mEq/L (ref 135–145)

## 2013-08-31 LAB — CBC
Hemoglobin: 13.4 g/dL (ref 13.0–17.0)
MCHC: 34.8 g/dL (ref 30.0–36.0)
MCV: 92.3 fL (ref 78.0–100.0)
Platelets: 56 10*3/uL — ABNORMAL LOW (ref 150–400)
RDW: 14 % (ref 11.5–15.5)

## 2013-08-31 LAB — PROTIME-INR
INR: 1.28 (ref 0.00–1.49)
Prothrombin Time: 15.7 seconds — ABNORMAL HIGH (ref 11.6–15.2)

## 2013-08-31 LAB — APTT: aPTT: 32 seconds (ref 24–37)

## 2013-08-31 SURGERY — REPAIR, HERNIA, VENTRAL, LAPAROSCOPIC
Anesthesia: General

## 2013-08-31 SURGICAL SUPPLY — 38 items
BAG HAMPER (MISCELLANEOUS) ×2 IMPLANT
BENZOIN TINCTURE PRP APPL 2/3 (GAUZE/BANDAGES/DRESSINGS) ×2 IMPLANT
CLOTH BEACON ORANGE TIMEOUT ST (SAFETY) ×2 IMPLANT
COVER LIGHT HANDLE STERIS (MISCELLANEOUS) ×4 IMPLANT
DECANTER SPIKE VIAL GLASS SM (MISCELLANEOUS) ×2 IMPLANT
DEVICE SECURE STRAP 25 ABSORB (INSTRUMENTS) ×2 IMPLANT
DEVICE TROCAR PUNCTURE CLOSURE (ENDOMECHANICALS) IMPLANT
DURAPREP 26ML APPLICATOR (WOUND CARE) ×2 IMPLANT
ELECT REM PT RETURN 9FT ADLT (ELECTROSURGICAL) ×2
ELECTRODE REM PT RTRN 9FT ADLT (ELECTROSURGICAL) ×1 IMPLANT
FILTER SMOKE EVAC LAPAROSHD (FILTER) ×2 IMPLANT
GLOVE BIOGEL PI IND STRL 7.5 (GLOVE) ×1 IMPLANT
GLOVE BIOGEL PI INDICATOR 7.5 (GLOVE) ×1
GLOVE ECLIPSE 7.0 STRL STRAW (GLOVE) ×2 IMPLANT
GOWN STRL REIN XL XLG (GOWN DISPOSABLE) ×6 IMPLANT
INST SET LAPROSCOPIC AP (KITS) ×2 IMPLANT
IV NS IRRIG 3000ML ARTHROMATIC (IV SOLUTION) ×2 IMPLANT
KIT ROOM TURNOVER APOR (KITS) ×2 IMPLANT
MANIFOLD NEPTUNE II (INSTRUMENTS) ×2 IMPLANT
NEEDLE INSUFFLATION 14GA 120MM (NEEDLE) ×2 IMPLANT
PACK LAP CHOLE LZT030E (CUSTOM PROCEDURE TRAY) ×2 IMPLANT
PAD ARMBOARD 7.5X6 YLW CONV (MISCELLANEOUS) ×2 IMPLANT
SEALER TISSUE G2 CVD JAW 35 (ENDOMECHANICALS) IMPLANT
SEALER TISSUE G2 CVD JAW 45CM (ENDOMECHANICALS)
SET BASIN LINEN APH (SET/KITS/TRAYS/PACK) ×2 IMPLANT
SLEEVE Z-THREAD 5X100MM (TROCAR) ×2 IMPLANT
STRIP CLOSURE SKIN 1/2X4 (GAUZE/BANDAGES/DRESSINGS) ×2 IMPLANT
SUT MNCRL AB 4-0 PS2 18 (SUTURE) ×2 IMPLANT
SUT NOVA NAB GS-22 2 2-0 T-19 (SUTURE) ×2 IMPLANT
SUT VIC AB 2-0 CT1 27 (SUTURE) ×1
SUT VIC AB 2-0 CT1 TAPERPNT 27 (SUTURE) ×1 IMPLANT
TOWEL OR 17X26 4PK STRL BLUE (TOWEL DISPOSABLE) ×2 IMPLANT
TRAY FOLEY CATH 16FR SILVER (SET/KITS/TRAYS/PACK) ×2 IMPLANT
TROCAR ENDO BLADELESS 11MM (ENDOMECHANICALS) ×2 IMPLANT
TROCAR XCEL NON-BLD 5MMX100MML (ENDOMECHANICALS) ×2 IMPLANT
TROCAR XCEL UNIV SLVE 11M 100M (ENDOMECHANICALS) ×2 IMPLANT
TUBING INSUF HEATED (TUBING) ×2 IMPLANT
WARMER LAPAROSCOPE (MISCELLANEOUS) ×2 IMPLANT

## 2013-08-31 NOTE — Progress Notes (Signed)
Patient back from OR stating that surgery was cancelled due to low platelets.  Patient also stated that MD was d/c pt home and having him come back on an outpatient basis for platelet replacement therapy.  D/c instructions reviewed with patient and wife.  Verbalized understanding.  Pt dc'd to home with wife.  Schonewitz, Candelaria Stagers 08/31/2013

## 2013-08-31 NOTE — Progress Notes (Signed)
Day of Surgery  Subjective: Without new symptoms. Patient aware of warning laboratory values. No complaints this morning.  Objective: Vital signs in last 24 hours: Temp:  [97.4 F (36.3 C)-98.4 F (36.9 C)] 98.3 F (36.8 C) (10/08 0715) Pulse Rate:  [83-92] 84 (10/08 0447) Resp:  [20-21] 21 (10/08 0715) BP: (105-154)/(55-92) 133/76 mmHg (10/08 0715) SpO2:  [94 %-100 %] 97 % (10/08 0715) Last BM Date: 08/30/13  Intake/Output from previous day: 10/07 0701 - 10/08 0700 In: 375 [Blood:375] Out: -  Intake/Output this shift:    General appearance: alert and no distress GI: soft, non-tender; bowel sounds normal; no masses,  no organomegaly  Lab Results:   Recent Labs  08/29/13 1321 08/31/13 0620  WBC  --  3.2*  HGB  --  13.4  HCT  --  38.5*  PLT 67* 56*   BMET  Recent Labs  08/31/13 0620  NA 137  K 4.2  CL 102  CO2 25  GLUCOSE 311*  BUN 12  CREATININE 0.92  CALCIUM 9.7   PT/INR  Recent Labs  08/29/13 1300 08/31/13 0620  LABPROT 15.2 15.7*  INR 1.23 1.28   ABG No results found for this basename: PHART, PCO2, PO2, HCO3,  in the last 72 hours  Studies/Results: No results found.  Anti-infectives: Anti-infectives   Start     Dose/Rate Route Frequency Ordered Stop   08/31/13 0600  ceFAZolin (ANCEF) IVPB 2 g/50 mL premix  Status:  Discontinued     2 g 100 mL/hr over 30 Minutes Intravenous On call to O.R. 08/30/13 1628 08/30/13 1630   08/31/13 0600  ceFAZolin (ANCEF) IVPB 1 g/50 mL premix     1 g 100 mL/hr over 30 Minutes Intravenous On call to O.R. 08/30/13 1633 09/01/13 0559   08/31/13 0600  ceFAZolin (ANCEF) IVPB 1 g/50 mL premix     1 g 100 mL/hr over 30 Minutes Intravenous On call to O.R. 08/30/13 1633 09/01/13 0559      Assessment/Plan: s/p Procedure(s): LAPAROSCOPIC RECURRENT VENTRAL HERNIA (N/A) Given patient's decrease in platelets levels despite being transfused 2 packs of platelets through the night, it has been discussed with the  anesthesiologist regaarrding proceeding with the case. At this time after discussion with the patient in the anesthesiologist has been decided to forego any operative intervention this morning. At this time patient will be canceled from his surgery due to persistent thrombocytopenia and relatively high risk of proceeding to the operating room for a nonemergent issue. Patient expresses understanding. He is comfortable with decision. At this point plans will be made for discharge. Continued outpatient workup and followup will be undertaken.  LOS: 1 day    Emmaclaire Switala C 08/31/2013

## 2013-08-31 NOTE — Discharge Summary (Signed)
Physician Discharge Summary  Patient ID: Kyle [name] MRN: [id number] DOB/AGE: 1956-08-26 57 y.o.  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 *   Discharged Condition: stable  Hospital Course: Patient presented to [facility] for planned platelet transfusion prior to elective surgical intervention. He was scheduled to undergo a laparoscopic ventral hernia repair for a recurrent ventral hernia. During his preoperative evaluation he was noted the patient was thrombocytopenia likely a sequela of his liver disease. Options were discussed with patient patient was brought to the hospital for platelet administration. He actually received 2 units of platelets. He tolerated the transfusions without difficulty however repeat laboratory investigation demonstrated a decrease in his overall platelet count. Due to this persistent thrombocytopenia and the relative risk of proceeding to the operating room for elective procedure he was discussed with the patient cancellation of his operation. As his operation will now be canceled there is no reason for continued platelet administration as patient has no evidence of active bleeding from his thrombocytopenia. He is followed closely in the hematology clinic and will followup with them. Patient understands and is agreeable to planned discharge.  Consults: None  Significant Diagnostic Studies: labs: CBC, complete metabolic panel  Treatments: IV hydration and platelets  Discharge Exam: Blood pressure 133/76, pulse 84, temperature 98.3 F (36.8 C), temperature source Oral, resp. rate 21, SpO2 97.00%. General appearance: alert and no distress Resp: clear to auscultation bilaterally Cardio: regular rate and rhythm GI: soft, non-tender; bowel sounds normal; no masses,  no organomegaly and  Protuberant, palpable reducible ventral hernia  Disposition: 01-Home or Self Care  Discharge Orders   Future Appointments Provider Department Dept Phone   09/05/2013 8:00 AM [facility] [place] [facility] [phone]   NPO after midnight.   09/09/2013 10:00 AM [facility] [phone]   10/24/2013 10:10 AM [facility] [phone]   10/28/2013 11:30 AM [facility] [phone]   Future Orders Complete By Expires   Diet - low sodium heart healthy  As directed    Discharge instructions  As directed    Comments:     Resume normal activity.   Increase activity slowly  As directed        Medication List         carvedilol 6.25 MG tablet  Commonly known as:  COREG  Take 1 tablet (6.25 mg total) by mouth 2 (two) times daily with a meal.     gabapentin 300 MG capsule  Commonly known as:  NEURONTIN  Take 300-900 mg by mouth 3 (three) times daily. Takes 300 mg in the morning and afternoon, then takes 900 mg in the evening.     insulin regular 100 units/mL injection  Commonly known as:  NOVOLIN R,HUMULIN R  Inject 22 Units into the skin 3 (three) times daily before meals.     lactulose 10 GM/15ML solution  Commonly known as:  CHRONULAC  Take 5 g by mouth daily.     omeprazole 40 MG capsule  Commonly known as:  PRILOSEC  Take 1 capsule (40 mg total) by mouth daily.     rifaximin 550 MG Tabs tablet  Commonly known as:  XIFAXAN  Take 1 tablet (550 mg total) by mouth 2 (two) times daily.     zolpidem 5 MG tablet  Commonly  known as:  AMBIEN  Take 1-2 tablets (5-10 mg total) by mouth at bedtime as needed for sleep.         Signed: Nazli Penn C 08/31/2013, 8:03 AM

## 2013-08-31 NOTE — Progress Notes (Signed)
Utilization Review Complete  

## 2013-08-31 NOTE — H&P (Signed)
  NTS SOAP Note  Vital Signs:  Vitals as of: 08/23/2013: Systolic 122: Diastolic 82: Heart Rate 91: Temp 28F: Height 50ft 7in: Weight 247Lbs 5 Ounces: BMI 38.73  BMI : 38.73 kg/m2  Subjective: This 36 Years 55 Months old Male presents for of  New bulge above the umbilicus. Some discomfort. Still able to reduce spontaneously. Currently on transplant list for liver transplant. Patient did have a shunt placed earlier this year  Review of Symptoms:  Constitutional:unremarkable   Head:unremarkable    Eyes:unremarkable   Nose/Mouth/Throat:unremarkable Cardiovascular:  unremarkable   Respiratory:unremarkable        as per history of present illness Genitourinary:unremarkable     Musculoskeletal:unremarkable   Skin:unremarkable Breast:unremarkable   Hematolgic/Lymphatic:unremarkable     Allergic/Immunologic:unremarkable     Past Medical History:    Reviewed   Past Medical History  Surgical History: carpel tunnel, adrenal surgery, TURB, skin cancer excision. Medical Problems: urothelial cancer, anemia, thrombocytopenia, ventral hernia, EtOH abuse history, psoriasis, GERD, HTN, DM, cirrhosis, sleep apnea, hyperlipidemia, restless leg syndrome. Psychiatric History: depression Allergies: NKDA Medications: bentyl, lasix, neurontin, novolog, lantus, prinivil, zestril, glucophage, prilosec, mirapex, inderal, zocor, desyrel   Social History:Reviewed  Social History  No current EtOH use.  Prior heavy use. No rec drug use.   Smoking Status: Current every day smoker reviewed on 03/28/2012 Started Date: 11/24/1976 Packs per day: 1.50 Functional Status reviewed on mm/dd/yyyy ------------------------------------------------ Bathing: Normal Cooking: Normal Dressing: Normal Driving: Normal Eating: Normal Managing Meds: Normal Oral Care: Normal Shopping: Normal Toileting: Normal Transferring: Normal Walking: Normal Cognitive Status  reviewed on mm/dd/yyyy ------------------------------------------------ Attention: Normal Decision Making: Normal Language: Normal Memory: Normal Motor: Normal Perception: Normal Problem Solving: Normal Visual and Spatial: Normal   Family History:  Reviewed   Family Health History  non-contributory    Objective Information: General:  Well appearing, well nourished in no distress. Skin:     no rash or prominent lesions Head:Atraumatic; no masses; no abnormalities Eyes:  conjunctiva clear, EOM intact, PERRL Mouth:  Mucous membranes moist, no mucosal lesions. Neck:  Supple without lymphadenopathy.  Heart:  RRR, no murmur Lungs:    CTA bilaterally, no wheezes, rhonchi, rales.  Breathing unlabored. Abdomen:Soft, NT/ND, no HSM, no masses.   reducible midline hernia  Assessment:    Plan:  recurrent ventral hernia. Options discussed at length with the patient. At this point patient will consider options. Have discussed the laparoscopic and open approaches. Patient understands risks benefits alternatives of both approaches. Patient understands that should we proceed with a laparoscopic [place] she will likely have more pain. Patient will call when he is ready to schedule. Additionally patient was advised again on signs and symptoms of incarceration and strangulation and he is aware to proceed to the emergency department should this occur.  Patient Education:Alternative treatments to surgery were discussed with patient (and family).  Risks and benefits  of procedure were fully explained to the patient (and family) who gave informed consent. Patient/family questions were addressed.  Follow-up:Pending Surgery   Addendum:  Patient was noted to have thrombocytopenia during his preoperative evaluation. Patient was typed and crossed to receive additional platelets. Due to timing factors patient will be admitted prior to his day of surgery for  administration of platelets.

## 2013-08-31 NOTE — Progress Notes (Signed)
Drs Leticia Penna and Jayme Cloud notified of abnormal labs. Surgery cancelled. Pt transferred back to unit 300.

## 2013-09-03 LAB — TYPE AND SCREEN
ABO/RH(D): O POS
Antibody Screen: NEGATIVE
Unit division: 0
Unit division: 0

## 2013-09-05 ENCOUNTER — Ambulatory Visit (HOSPITAL_COMMUNITY)
Admission: RE | Admit: 2013-09-05 | Discharge: 2013-09-05 | Disposition: A | Discharge status: Home / Self Care | Payer: Medicaid Other | Source: Ambulatory Visit | Attending: Internal Medicine | Admitting: Internal Medicine

## 2013-09-05 DIAGNOSIS — K7460 Unspecified cirrhosis of liver: Secondary | ICD-10-CM | POA: Insufficient documentation

## 2013-09-09 ENCOUNTER — Encounter (HOSPITAL_COMMUNITY): Payer: Medicaid Other | Attending: Oncology

## 2013-09-09 DIAGNOSIS — D509 Iron deficiency anemia, unspecified: Secondary | ICD-10-CM | POA: Insufficient documentation

## 2013-09-09 LAB — CBC WITH DIFFERENTIAL/PLATELET
Basophils Absolute: 0 10*3/uL (ref 0.0–0.1)
Basophils Relative: 0 % (ref 0–1)
Eosinophils Absolute: 0.1 10*3/uL (ref 0.0–0.7)
Eosinophils Relative: 3 % (ref 0–5)
Hemoglobin: 15.6 g/dL (ref 13.0–17.0)
Lymphs Abs: 1.5 10*3/uL (ref 0.7–4.0)
MCH: 32.9 pg (ref 26.0–34.0)
MCHC: 36 g/dL (ref 30.0–36.0)
MCV: 91.4 fL (ref 78.0–100.0)
Neutrophils Relative %: 58 % (ref 43–77)
Platelets: 67 10*3/uL — ABNORMAL LOW (ref 150–400)
RBC: 4.74 MIL/uL (ref 4.22–5.81)
RDW: 13.7 % (ref 11.5–15.5)

## 2013-09-09 LAB — FERRITIN: Ferritin: 51 ng/mL (ref 22–322)

## 2013-09-09 NOTE — Progress Notes (Signed)
Labs drawn today for cbc/diff,ferr 

## 2013-09-11 ENCOUNTER — Other Ambulatory Visit (HOSPITAL_COMMUNITY): Payer: Self-pay | Admitting: Oncology

## 2013-09-11 DIAGNOSIS — D509 Iron deficiency anemia, unspecified: Secondary | ICD-10-CM

## 2013-09-15 ENCOUNTER — Encounter (HOSPITAL_BASED_OUTPATIENT_CLINIC_OR_DEPARTMENT_OTHER): Payer: Medicaid Other

## 2013-09-15 DIAGNOSIS — D509 Iron deficiency anemia, unspecified: Secondary | ICD-10-CM

## 2013-09-15 MED ORDER — SODIUM CHLORIDE 0.9 % IV SOLN
1020.0000 mg | Freq: Once | INTRAVENOUS | Status: AC
  Administered 2013-09-15: 1020 mg via INTRAVENOUS
  Filled 2013-09-15: qty 34

## 2013-09-15 MED ORDER — SODIUM CHLORIDE 0.9 % IV SOLN
Freq: Once | INTRAVENOUS | Status: AC
  Administered 2013-09-15: 11:00:00 via INTRAVENOUS

## 2013-09-15 NOTE — Progress Notes (Signed)
Tolerated well

## 2013-10-24 ENCOUNTER — Encounter (HOSPITAL_COMMUNITY): Payer: Medicaid Other | Attending: Oncology

## 2013-10-24 DIAGNOSIS — D509 Iron deficiency anemia, unspecified: Secondary | ICD-10-CM | POA: Insufficient documentation

## 2013-10-24 DIAGNOSIS — E119 Type 2 diabetes mellitus without complications: Secondary | ICD-10-CM | POA: Insufficient documentation

## 2013-10-24 LAB — CBC WITH DIFFERENTIAL/PLATELET
Basophils Relative: 0 % (ref 0–1)
Eosinophils Relative: 2 % (ref 0–5)
HCT: 44.3 % (ref 39.0–52.0)
Hemoglobin: 15.8 g/dL (ref 13.0–17.0)
Lymphs Abs: 1.2 10*3/uL (ref 0.7–4.0)
Monocytes Relative: 9 % (ref 3–12)
Neutro Abs: 2.1 10*3/uL (ref 1.7–7.7)
Neutrophils Relative %: 57 % (ref 43–77)
Platelets: 53 10*3/uL — ABNORMAL LOW (ref 150–400)
RBC: 4.76 MIL/uL (ref 4.22–5.81)

## 2013-10-24 LAB — FERRITIN: Ferritin: 132 ng/mL (ref 22–322)

## 2013-10-24 NOTE — Progress Notes (Signed)
Labs drawn today for cbc/diff,ferr 

## 2013-10-25 ENCOUNTER — Other Ambulatory Visit: Payer: Self-pay | Admitting: Family Medicine

## 2013-10-25 ENCOUNTER — Other Ambulatory Visit: Payer: Self-pay | Admitting: Physician Assistant

## 2013-10-25 NOTE — Telephone Encounter (Signed)
Medication refilled per protocol. 

## 2013-10-26 ENCOUNTER — Encounter: Payer: Self-pay | Admitting: Internal Medicine

## 2013-10-27 NOTE — Progress Notes (Signed)
Kyle [name],Kyle BETH, PA-C [address] [place] [address]  ANEMIA-IRON DEFICIENCY  HEPATIC CIRRHOSIS  Thrombocytopenia due to hypersplenism  Anemia due to multiple mechanisms  Esophageal varices  Acute posthemorrhagic anemia  Type II or unspecified type diabetes mellitus without mention of complication, not stated as uncontrolled - Plan: Hemoglobin A1c  CURRENT THERAPY: Intermittent Feraheme 1020 mg IV PRN lab results. Last infusion was on 09/15/2013.  INTERVAL HISTORY: Kyle [name] 57 y.o. male returns for  regular  visit for followup of anemia of multiple mechanisms.  Kyle [name] is following up with Dr. Jena Gauss as directed and he recently saw Dr. Jena Gauss. He is maintained on Lactulose (with a recent increase to 5 cc BID) and Xifaxan. Kyle [name] is also scheduled for a screening ultrasound of liver next month. Per Dr. Luvenia Starch note, he plans on getting Kyle [name] back to [facility] transplant clinic Kyle 1st of Kyle year.  I personally reviewed and went over laboratory results with Kyle [name].  His labs follow below, but his labs from 10/24/2013 were stable with a ferritin of 132.   He recently had an [place] on 09/06/2013 which demonstrated cirrhotic changes with splenomegaly.  I personally reviewed and went over radiographic studies with Kyle [name].  Kyle [name] was scheduled to undergo elective laparoscopic ventral hernia repair for a recurrent ventral hernia by Dr. Leticia Penna.  He was admitted for platelet transfusion to help increase platelets prior to surgery by Dr. Leticia Penna, but his platelet's failed to increase.  As a result, surgery was cancelled.  Kyle [name]'s platelets are chronically low due to his hypersplenism and therefore as he was getting platelets, they were sequestrated in Kyle spleen and therefore peripheral blood did not demonstrate rise in platelets.  From a hematologic standpoint, he is cleared for surgery.  Kyle [name] is accompanied by his brother, Kyle [name], who wants to do Kyle majority of Kyle  talking today about himself.  I was able to keep that to a minimum by not paying attention to him and focusing on Kyle [name].    He reports that it has been a number of years since he had an eye exam.  Since Kyle [name] is a diabetic, he does require at least annual eye exams.  He reports that he has been having difficulty finding an ophthalmologist for testing and therefore, I will refer him to Dr. Charise Killian for examination.   [name] is doing well and denies any hematologic complaints and ROS questioning is negative.    Past Medical History  Diagnosis Date  . DM (diabetes mellitus)   . Cirrhosis     bx proven steatohepatitis with cirrhosis (2010); per note from Feb 2011, received Hep A and B vaccines in 2010  . HTN (hypertension)   . RLS (restless legs syndrome)   . Hyperlipidemia   . IDA (iron deficiency anemia)   . GERD (gastroesophageal reflux disease)   . Depression   . Peripheral neuropathy   . Urothelial cancer     2010, paillary low-grade, h/o recurrence 2011  . B12 deficiency   . Psoriasis   . 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 Anemia due to multiple mechanisms 05/13/2011  . History of alcohol abuse 05/13/2011  . ANEMIA-IRON DEFICIENCY 03/09/2009  . S/P endoscopy May 2012    4 columns grade II esophageal varices; due for repeat in Nov 2013   . S/P colonoscopy May 2012    Tubular adenoma  . Low back pain   . Cancer  bladder ca 05/2009 removal and  w/chemo wash  . Esophageal varices with bleeding(456.0) 11/24/11    s/p emergent EGD 11/25/11 by Dr. Rhea Belton at [facility], Grade III esophageal varices s/p banding X 5  . Small bowel obstruction 02/15/2012    Admitted to [facility], managed by Dr. Leticia Penna, ventral hernia manually reduced  . Ventral hernia 02/15/12  . MRSA (methicillin resistant Staphylococcus aureus)   . Acute post-ligation esophageal ulcer with hemorrhage 04/11/2012  . Clostridium difficile infection   . 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  . H. pylori infection 12/24/2012  . Sleep apnea     [facility] took him off CPAP when had tips procedure  . Sleep apnea     has BLADDER CANCER; DIABETES MELLITUS, TYPE II, CONTROLLED, WITH COMPLICATIONS; ADRENAL MASS; HYPERLIPIDEMIA; ANEMIA-IRON DEFICIENCY; ANEMIA, VITAMIN B12 DEFICIENCY; SMOKER; DEPRESSION; SLEEP APNEA, OBSTRUCTIVE, MODERATE; RESTLESS LEG SYNDROME; HYPERTENSION; GERD; HEPATIC CIRRHOSIS; Other chronic nonalcoholic liver disease; ARTHRITIS; SHOULDER PAIN, LEFT; INSOMNIA; FATIGUE; WEIGHT GAIN; CHEST DISCOMFORT; PROTEINURIA; ABNORMAL ELECTROCARDIOGRAM; Thrombocytopenia due to hypersplenism; Anemia due to multiple mechanisms; History of alcohol abuse; Abdominal pain; External hemorrhoid; Upper GI bleed; Hypotension; Diarrhea; Esophageal varices; Umbilical hernia; History of small bowel obstruction; Acute post-ligation esophageal ulcer with hemorrhage; Acute posthemorrhagic anemia; Acute respiratory failure; UTI (urinary tract infection); Hyperkalemia; H. pylori infection; Microscopic hematuria; Hemorrhagic shock; Respiratory failure with hypercapnia; Aspiration pneumonia; SIRS (systemic inflammatory response syndrome); Seizure; and Fever, unspecified on his problem list.     is allergic to aspirin; ibuprofen; and tylenol.  Kyle [name] had no medications administered during this visit.  Past Surgical History  Procedure Laterality Date  . Carpel tunnel Right   . Shoulder surgery Left   . Adrenal mass surgery  05/2009    benign, left  . Bladder surgery  01/2009 and 06/2010    cancer 2010, small recurrence in 06/2010  . Colonoscopy  04/2009    moderate int hemorrhoids, rare sigmoid diverticula, one mm sessile hyperplastic rectal polyp  . Esophagogastroduodenoscopy  03/2009    small hh  . Small bowel  capsule endoscopy  03/2009    couple of small benign appearing erosions, nonbleeding  . Egd/tcs  08/2007    small hiatal hernia, pancolonic diverticula, friable anal canal, 3cm salmon colored epithelium in distal esophagus, bx negative for Barrett's  . Skin cancer excision  Oct 2012    left arm  . Esophagogastroduodenoscopy  11/25/2011    Dr. Vedia Coffer III varices in Kyle mid and distal esophagus, banding placed, portal astropathy  . Esophagogastroduodenoscopy  01/14/2012    Dr Rourk->4-5 columns Gr2 varices, 6 bands placed, HH, distal esophageal ulcer, portal gastropathy, antral erosions  . Esophagogastroduodenoscopy  03/17/2012    Dr. Rito Ehrlich varices status post band ligation. Hiatal hernia. Portal gastropathy.  . Umbilical hernia repair  04/05/2012    Procedure: HERNIA REPAIR UMBILICAL ADULT;  Surgeon: Fabio Bering, MD;  Location: [facility];  Service: General;  Laterality: N/A;  . Esophagogastroduodenoscopy  04/11/2012    Dr. Lysle Pearl ulcer possibly at previous banding sites with stigmata     of bleeding.  Attempt at hemostasis with hemoclip and banding was not successful resulting in recurrence of bleed/  Portal gastropathy, but no evidence of gastric varices/ Recurrent esophageal varices grade 2.  . Esophagogastroduodenoscopy  05/24/2012    Dr. Rito Ehrlich varices,  portal gastropathy  . Esophagogastroduodenoscopy  04/11/12    Dr. Lorella Nimrod in Kyle distal esophagus adherent lot and visible vessel treated with bicap. grade I varices in Kyle distal esophagus, one ligating band placed, portal hypertensive gastropathy in Kyle body of Kyle stomach.  . Colonoscopy  09/29/2012    WGN:FAOZHY varices. Scattered pancolonic diverticula/single cecal polyp-removed as described above  . Esophagogastroduodenoscopy  12/24/2012    Dr. Norvel Richards significant upper GI bleed secondary to bleeding esophageal varices. s/p hemostasis therapy with injection of a total of 9mL of 5%  ethanolamine and application of 9 bands, complete egd not carried out.  . Schlerotherapy  12/24/2012    Procedure: Theresia Majors OF VARICES;  Surgeon: Corbin Ade, MD;  Location: [facility];  Service: Endoscopy;  Laterality: N/A;  . Esophageal banding  12/24/2012    QMV:[id number] significant upper GI bleed secondary to bleeding esophageal varices  . Tips procedure  12/2012    [facility]  . Cataract extraction, bilateral      Denies any headaches, dizziness, double vision, fevers, chills, night sweats, nausea, vomiting, diarrhea, constipation, chest pain, heart palpitations, shortness of breath, blood in stool, black tarry stool, urinary pain, urinary burning, urinary frequency, hematuria.   PHYSICAL EXAMINATION  ECOG PERFORMANCE STATUS: 1 - Symptomatic but completely ambulatory  Filed Vitals:   10/28/13 1100  BP: 148/85  Pulse: 85  Temp: 97 F (36.1 C)  Resp: 20    GENERAL:alert, no distress, well nourished, well developed, comfortable, cooperative, obese and smiling SKIN: skin color, texture, turgor are normal, no rashes or significant lesions HEAD: Normocephalic, No masses, lesions, tenderness or abnormalities EYES: normal, PERRLA, EOMI, Conjunctiva are pink and non-injected EARS: External ears normal OROPHARYNX:mucous membranes are moist  NECK: supple, trachea midline LYMPH:  not examined BREAST:not examined LUNGS: not examined HEART: not examined ABDOMEN:obese BACK: Back symmetric, no curvature. EXTREMITIES:less then 2 second capillary refill, no skin discoloration, no cyanosis  NEURO: alert & oriented x 3 with fluent speech, no focal motor/sensory deficits, gait normal   LABORATORY DATA: CBC    Component Value Date/Time   WBC 3.8* 10/24/2013 0906   RBC 4.76 10/24/2013 0906   RBC 3.97* 12/18/2010 1235   HGB 15.8 10/24/2013 0906   HCT 44.3 10/24/2013 0906   PLT 53* 10/24/2013 0906   MCV 93.1 10/24/2013 0906   MCH 33.2 10/24/2013 0906   MCHC 35.7 10/24/2013 0906    RDW 14.3 10/24/2013 0906   LYMPHSABS 1.2 10/24/2013 0906   MONOABS 0.3 10/24/2013 0906   EOSABS 0.1 10/24/2013 0906   BASOSABS 0.0 10/24/2013 0906    Lab Results  Component Value Date   FERRITIN 132 10/24/2013     RADIOGRAPHIC STUDIES:  09/06/2013  CLINICAL DATA: Cirrhosis  EXAM:  ULTRASOUND ABDOMEN COMPLETE  COMPARISON: 02/21/2013  FINDINGS:  Gallbladder  An echogenic focus is noted within Kyle gallbladder. No significant  shadowing is noted. This may represent a poorly calcified stone. No  wall thickening is noted.  Common bile duct  Diameter: 6.5 mm.  Liver  Diffuse cirrhotic change of Kyle liver is noted. A patent TIPS shunt  is noted.  IVC  No abnormality visualized.  Pancreas  Not well visualized due to overlying bowel gas.  Spleen  Splenomegaly is again noted.  Right Kidney  Length: 11.0 cm in length. Echogenicity within normal limits. No  mass or hydronephrosis visualized.  Left Kidney  Length: 12.7 cm in length. Echogenicity within normal limits. No  mass or hydronephrosis visualized.  Abdominal aorta  No aneurysm visualized.  IMPRESSION:  Cirrhotic change of Kyle liver with evidence of splenomegaly.  Patent TIPS shunt.  Echogenic focus within Kyle gallbladder which may represent a poorly  calcified stone.  Electronically Signed  By: Alcide Clever M.D.  On: 09/05/2013 08:17     ASSESSMENT:  1. Multifactorial anemia, last Feraheme 1020 IV infusion was on 03/24/2013.  2. Iron deficiency anemia requiring IV Feraheme infusions  3. Liver disease with fatty infiltration.  4. Hospitalization for seizure-like activity due to hyperglycemia in March 2014.  5. Recent Hospitalization from severe upper esophageal bleeding requiring multiple PRBC transfusions, intubation, and transfer to [facility] for TIPS procedure which was performed on 01/17/2013. He was in ICU at [facility] for over 20 days.  6. Obesity  7. Probably lung disease secondary to longstanding smoking history.  8. DM    9. Esophageal varices  10. RLS, failed Tramadol and Gabapentin.  11. Insomnia, on ambien and much improved.   [name] Active Problem List   Diagnosis Date Noted  . Fever, unspecified 02/05/2013  . Seizure 02/03/2013  . Aspiration pneumonia 12/25/2012  . SIRS (systemic inflammatory response syndrome) 12/25/2012  . UTI (urinary tract infection) 12/24/2012  . Hyperkalemia 12/24/2012  . H. pylori infection 12/24/2012  . Microscopic hematuria 12/24/2012  . Hemorrhagic shock 12/24/2012  . Respiratory failure with hypercapnia 12/24/2012  . Acute post-ligation esophageal ulcer with hemorrhage 04/11/2012  . Acute posthemorrhagic anemia 04/11/2012  . Acute respiratory failure 04/11/2012  . Umbilical hernia 03/03/2012  . History of small bowel obstruction 03/03/2012  . Diarrhea 12/19/2011  . Esophageal varices 12/19/2011  . Upper GI bleed 11/25/2011  . Hypotension 11/25/2011  . Abdominal pain 11/04/2011  . External hemorrhoid 11/04/2011  . 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 Anemia due to multiple mechanisms 05/13/2011  . History of alcohol abuse 05/13/2011  . Other chronic nonalcoholic liver disease 01/02/2010  . DIABETES MELLITUS, TYPE II, CONTROLLED, WITH COMPLICATIONS 07/23/2009  . BLADDER CANCER 05/21/2009  . HEPATIC CIRRHOSIS 05/21/2009  . SHOULDER PAIN, LEFT 04/19/2009  . SMOKER 04/04/2009  . GERD 04/04/2009  . ARTHRITIS 04/04/2009  . ANEMIA-IRON DEFICIENCY 03/09/2009  . ANEMIA, VITAMIN B12 DEFICIENCY 03/09/2009  . CHEST DISCOMFORT 02/28/2009  . ADRENAL MASS 12/27/2008  . INSOMNIA 12/27/2008  . RESTLESS LEG SYNDROME 12/13/2008  . SLEEP APNEA, OBSTRUCTIVE, MODERATE 10/25/2008  . PROTEINURIA 10/06/2008  . HYPERLIPIDEMIA 10/04/2008  . DEPRESSION 10/04/2008  . HYPERTENSION 10/04/2008  . FATIGUE 10/04/2008  . WEIGHT GAIN 10/04/2008  . ABNORMAL ELECTROCARDIOGRAM 10/04/2008      PLAN:  1. I personally reviewed and went over laboratory results with Kyle  [name].  2. I personally reviewed and went over radiographic studies with Kyle [name]. 3. Follow-up with GI as directed  4. Smoking cessation education provided  5. Feraheme IV PRN per lab results  6. Labs every 6 weeks: CBC diff, Ferritin  7. Clear from surgery from a hematologic standpoint as his platelets will not increase due to splenomegaly. 8. Referral to Dr. Charise Killian, Ophthalmologist, for annual eye exam including diabetic retinopathy screening.  9. Return in 12 weeks for follow-up   THERAPY PLAN:  He is doing well and we will continue to support his count with IV Ferahame.   All questions were answered. Kyle [name] knows to call Kyle clinic with any problems, questions or concerns. We can certainly see Kyle [name] much sooner if necessary.  [name] and plan discussed with Dr. Alla German and he is in agreement with Kyle aforementioned.  Darrelyn Morro

## 2013-10-28 ENCOUNTER — Encounter (HOSPITAL_BASED_OUTPATIENT_CLINIC_OR_DEPARTMENT_OTHER): Payer: Medicaid Other | Admitting: Oncology

## 2013-10-28 ENCOUNTER — Encounter (HOSPITAL_COMMUNITY): Payer: Self-pay | Admitting: Oncology

## 2013-10-28 VITALS — BP 148/85 | HR 85 | Temp 97.0°F | Resp 20 | Wt 254.9 lb

## 2013-10-28 DIAGNOSIS — D6489 Other specified anemias: Secondary | ICD-10-CM

## 2013-10-28 DIAGNOSIS — D6959 Other secondary thrombocytopenia: Secondary | ICD-10-CM

## 2013-10-28 DIAGNOSIS — F17200 Nicotine dependence, unspecified, uncomplicated: Secondary | ICD-10-CM

## 2013-10-28 DIAGNOSIS — K7460 Unspecified cirrhosis of liver: Secondary | ICD-10-CM

## 2013-10-28 DIAGNOSIS — E119 Type 2 diabetes mellitus without complications: Secondary | ICD-10-CM

## 2013-10-28 DIAGNOSIS — I8500 Esophageal varices without bleeding: Secondary | ICD-10-CM

## 2013-10-28 DIAGNOSIS — D509 Iron deficiency anemia, unspecified: Secondary | ICD-10-CM

## 2013-10-28 DIAGNOSIS — D62 Acute posthemorrhagic anemia: Secondary | ICD-10-CM

## 2013-10-28 DIAGNOSIS — D649 Anemia, unspecified: Secondary | ICD-10-CM

## 2013-10-28 DIAGNOSIS — K769 Liver disease, unspecified: Secondary | ICD-10-CM

## 2013-10-28 NOTE — Patient Instructions (Signed)
[facility] Discharge Instructions  RECOMMENDATIONS MADE BY THE CONSULTANT AND ANY TEST RESULTS WILL BE SENT TO YOUR REFERRING PHYSICIAN.  Labs in 6 weeks and 12 weeks. MD appointment in 12 weeks. Elijah Birk will have Mazzie send a referral to Dr.Cotter.  Thank you for choosing [facility] to provide your oncology and hematology care.  To afford each patient quality time with our providers, please arrive at least 15 minutes before your scheduled appointment time.  With your help, our goal is to use those 15 minutes to complete the necessary work-up to ensure our physicians have the information they need to help with your evaluation and healthcare recommendations.    Effective January 1st, 2014, we ask that you re-schedule your appointment with our physicians should you arrive 10 or more minutes late for your appointment.  We strive to give you quality time with our providers, and arriving late affects you and other patients whose appointments are after yours.    Again, thank you for choosing [facility].  Our hope is that these requests will decrease the amount of time that you wait before being seen by our physicians.       _____________________________________________________________  Should you have questions after your visit to [facility], please contact our office at [phone] between the hours of 8:30 a.m. and 5:00 p.m.  Voicemails left after 4:30 p.m. will not be returned until the following business day.  For prescription refill requests, have your pharmacy contact our office with your prescription refill request.

## 2013-11-11 ENCOUNTER — Telehealth: Payer: Self-pay | Admitting: Physician Assistant

## 2013-11-11 NOTE — Telephone Encounter (Signed)
Pt is needing a referral to the [facility] Call back number is [phone] (if not there leave message)

## 2013-11-25 NOTE — Progress Notes (Signed)
REVIEWED.  

## 2013-11-28 NOTE — Telephone Encounter (Signed)
Referral for what reason??  Phone number given not valid.  Left mess at mobile number.

## 2013-11-29 ENCOUNTER — Ambulatory Visit (INDEPENDENT_AMBULATORY_CARE_PROVIDER_SITE_OTHER): Payer: Medicaid Other | Admitting: Internal Medicine

## 2013-11-29 ENCOUNTER — Encounter (INDEPENDENT_AMBULATORY_CARE_PROVIDER_SITE_OTHER): Payer: Self-pay

## 2013-11-29 ENCOUNTER — Encounter: Payer: Self-pay | Admitting: Internal Medicine

## 2013-11-29 VITALS — BP 141/82 | HR 80 | Temp 97.6°F | Wt 248.8 lb

## 2013-11-29 DIAGNOSIS — K7460 Unspecified cirrhosis of liver: Secondary | ICD-10-CM

## 2013-11-29 NOTE — Patient Instructions (Signed)
Continue present medical regimen  We will refer you to the liver transplant clinic at [facility]  Hepatic ultrasound 02/2014  Office visit here in 6 months

## 2013-11-29 NOTE — Progress Notes (Signed)
Primary Care Physician:  [name], PA-C Primary Gastroenterologist:  Dr. Gala Romney  Pre-Procedure History & Physical: HPI:  Kyle [name] is a 58 y.o. male here for [name]/cirrhosis. 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 He underwent sclerotherapy and banding. Status post TIPS. Has had some fatigue. No obvious encephalopathy symptoms. Has been doing well. Followed by [facility] endocrinology for diabetes. Hasn't had melena or hematemesis. He's gained 4 pounds since he was last seen. Last ultrasound demonstrated cirrhosis without evidence of hepatoma. He has received hepatitis A and B. vaccination. He is taking lactulose and Rifaxin. Reflux symptoms well controlled on omeprazole. He may have gallstones on ultrasound.  Has been seen in the hematology clinic here and receives periodic IV iron for iron deficiency anemia the  Past Medical History  Diagnosis Date  . DM (diabetes mellitus)   . Cirrhosis     bx proven steatohepatitis with cirrhosis (2010); per note from Feb 2011, received Hep A and B vaccines in 2010. U/S on 09/05/13= no HCC  . HTN (hypertension)   . RLS (restless legs syndrome)   . Hyperlipidemia   . IDA (iron deficiency anemia)   . GERD (gastroesophageal reflux disease)   . Depression   . Peripheral neuropathy   . Urothelial cancer     2010, paillary low-grade, h/o recurrence 2011  . B12 deficiency   . Psoriasis   . 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 Anemia due to multiple mechanisms 05/13/2011  . History of alcohol abuse 05/13/2011  . ANEMIA-IRON DEFICIENCY 03/09/2009  . S/P endoscopy May 2012    4 columns grade II esophageal varices; due for repeat in Nov 2013   . S/P colonoscopy May 2012    Tubular adenoma  . Low back pain   . Cancer     bladder ca 05/2009 removal and  w/chemo wash  . Esophageal varices with bleeding(456.0) 11/24/11    s/p emergent EGD 11/25/11 by Dr. Hilarie Fredrickson at [facility], Grade III  esophageal varices s/p banding X 5  . Small bowel obstruction 02/15/2012    Admitted to [facility], managed by Dr. Geroge Baseman, ventral hernia manually reduced  . Ventral hernia 02/15/12  . MRSA (methicillin resistant Staphylococcus aureus)   . Acute post-ligation esophageal ulcer with hemorrhage 04/11/2012  . Clostridium difficile infection   . H. pylori infection 12/24/2012  . Sleep apnea     [facility] took him off CPAP when had tips procedure  . Sleep apnea     Past Surgical History  Procedure Laterality Date  . Carpel tunnel Right   . Shoulder surgery Left   . Adrenal mass surgery  05/2009    benign, left  . Bladder surgery  01/2009 and 06/2010    cancer 2010, small recurrence in 06/2010  . Colonoscopy  04/2009    moderate int hemorrhoids, rare sigmoid diverticula, one mm sessile hyperplastic rectal polyp  . Esophagogastroduodenoscopy  03/2009    small hh  . Small bowel capsule endoscopy  03/2009    couple of small benign appearing erosions, nonbleeding  . Egd/tcs  08/2007    small hiatal hernia, pancolonic diverticula, friable anal canal, 3cm salmon colored epithelium in distal esophagus, bx negative for Barrett's  . Skin cancer excision  Oct 2012    left arm  . Esophagogastroduodenoscopy  11/25/2011    Dr. Phineas Douglas III varices in the mid and distal esophagus, banding placed, portal astropathy  . Esophagogastroduodenoscopy  01/14/2012    Dr  Philisha Weinel->4-5 columns Gr2 varices, 6 bands placed, HH, distal esophageal ulcer, portal gastropathy, antral erosions  . Esophagogastroduodenoscopy  03/17/2012    Dr. Larina Bras varices status post band ligation. Hiatal hernia. Portal gastropathy.  . Umbilical hernia repair  04/05/2012    Procedure: HERNIA REPAIR UMBILICAL ADULT;  Surgeon: Donato Heinz, MD;  Location: [facility]S;  Service: General;  Laterality: N/A;  . Esophagogastroduodenoscopy  04/11/2012    Dr. Bridgett Larsson ulcer possibly at previous banding sites with stigmata     of bleeding.  Attempt at  hemostasis with hemoclip and banding was not successful resulting in recurrence of bleed/  Portal gastropathy, but no evidence of gastric varices/ Recurrent esophageal varices grade 2.  . Esophagogastroduodenoscopy  05/24/2012    Dr. Larina Bras varices, portal gastropathy  . Esophagogastroduodenoscopy  04/11/12    Dr. Drue Novel in the distal esophagus adherent lot and visible vessel treated with bicap. grade I varices in the distal esophagus, one ligating band placed, portal hypertensive gastropathy in the body of the stomach.  . Colonoscopy  09/29/2012    [facility]:CWCBJS varices. Scattered pancolonic diverticula/single cecal polyp-removed as described above  . Esophagogastroduodenoscopy  12/24/2012    Dr. Lynwood Dawley significant upper GI bleed secondary to bleeding esophageal varices. s/p hemostasis therapy with injection of a total of 75mL of 5% ethanolamine and application of 9 bands, complete egd not carried out.  . Schlerotherapy  12/24/2012    Procedure: Woodward Ku OF VARICES;  Surgeon: Daneil Dolin, MD;  Location: [facility];  Service: Endoscopy;  Laterality: N/A;  . Esophageal banding  12/24/2012    EGB:[id number] significant upper GI bleed secondary to bleeding esophageal varices  . Tips procedure  12/2012    [facility]  . Cataract extraction, bilateral      Prior to Admission medications   Medication Sig Start Date End Date Taking? Authorizing Provider  carvedilol (COREG) 6.25 MG tablet TAKE ONE TABLET BY MOUTH TWICE DAILY WITH A MEAL 10/25/13  Yes Susy Frizzle, MD  gabapentin (NEURONTIN) 300 MG capsule Take 300 mg by mouth 2 (two) times daily. 2 capsules 2 times daily   Yes Historical Provider, MD  insulin regular (NOVOLIN R,HUMULIN R) 100 units/mL injection Inject 32 Units into the skin 3 (three) times daily before meals.    Yes Historical Provider, MD  lactulose (CHRONULAC) 10 GM/15ML solution Take 5 g by mouth daily.  03/23/13  Yes Mahala Menghini, PA-C  omeprazole  (PRILOSEC) 40 MG capsule Take 1 capsule (40 mg total) by mouth daily. 12/17/12  Yes Andria Meuse, NP  rifaximin (XIFAXAN) 550 MG TABS tablet Take 1 tablet (550 mg total) by mouth 2 (two) times daily. 08/01/13  Yes Mahala Menghini, PA-C  zolpidem (AMBIEN) 5 MG tablet Take 1-2 tablets (5-10 mg total) by mouth at bedtime as needed for sleep. 08/25/13  Yes Baird Cancer, PA-C    Allergies as of 11/29/2013 - Review Complete 11/29/2013  Allergen Reaction Noted  . Aspirin Other (See Comments) 12/23/2012  . Ibuprofen Other (See Comments) 05/13/2012  . Tylenol [acetaminophen] Other (See Comments) 07/17/2011    Family History  Problem Relation Age of Onset  . Cirrhosis Father     etoh  . Colon cancer Neg Hx   . Anesthesia problems Neg Hx   . Hypotension Neg Hx   . Malignant hyperthermia Neg Hx   . Pseudochol deficiency Neg Hx   . Kidney cancer Mother   . Cancer Mother   . HIV Brother   .  Cirrhosis Brother     nash    History   Social History  . Marital Status: Married    Spouse Name: N/A    Number of Children: 3  . Years of Education: N/A   Occupational History  . disabled    Social History Main Topics  . Smoking status: Current Every Day Smoker -- 1.50 packs/day for 30 years    Types: Cigarettes  . Smokeless tobacco: Never Used  . Alcohol Use: No     Comment: drank heavily for few years in 20s  . Drug Use: No  . Sexual Activity: Yes    Birth Control/ Protection: None   Other Topics Concern  . Not on file   Social History Narrative  . No narrative on file    Review of Systems: See HPI, otherwise negative ROS  Physical Exam: BP 141/82  Pulse 80  Temp(Src) 97.6 F (36.4 C) (Oral)  Wt 248 lb 12.8 oz (112.855 kg) General:   Alert,  Well-developed, well-nourished, pleasant and cooperative in NAD Skin:  Intact without significant lesions or rashes. Eyes:  Sclera clear, no icterus.   Conjunctiva pink. Ears:  Normal auditory acuity. Nose:  No deformity, discharge,   or lesions. Mouth:  No deformity or lesions. Neck:  Supple; no masses or thyromegaly. No significant cervical adenopathy. Lungs:  Clear throughout to auscultation.   No wheezes, crackles, or rhonchi. No acute distress. Heart:  Regular rate and rhythm; no murmurs, clicks, rubs,  or gallops. Abdomen: Obese. , normal bowel sounds.  Soft and nontender without appreciable mass or hepatosplenomegaly. No shifting dullness or fluid wave. Pulses:  Normal pulses noted. Extremities:  Without clubbing or edema.  Impression:   Pleasant 58 year old gentleman with [name]/cirrhosis status post TIPS after recurrent variceal hemorrhage. Clinically doing well. GERD symptoms well controlled. Iron deficiency anemia apparently stable. History tubular adenoma 2013 colonoscopy-due for surveillance 2018.  Remains fairly well compensated at this time. As previously discussed with him, I feel it would be in patient's best interest to go ahead and have a formal transplant evaluation.  Recommendations:  Continue present medical regimen  We will refer you to the liver transplant clinic at [facility]  Hepatic ultrasound 02/2014  Hold off on checking labs at this time..  Office visit here in 6 months

## 2013-11-29 NOTE — Progress Notes (Signed)
Referral has been made to [facility] for Evaluation

## 2013-12-08 NOTE — Progress Notes (Signed)
Kyle [name] is scheduled with Dr. Suzi Roots on 01/05/14 at 10:30 at [facility]

## 2013-12-09 ENCOUNTER — Telehealth: Payer: Self-pay | Admitting: Physician Assistant

## 2013-12-09 DIAGNOSIS — Z0100 Encounter for examination of eyes and vision without abnormal findings: Principal | ICD-10-CM

## 2013-12-09 DIAGNOSIS — E119 Type 2 diabetes mellitus without complications: Secondary | ICD-10-CM

## 2013-12-09 NOTE — Telephone Encounter (Signed)
Pt is needing a referral to [facility] Opthalmology diabetic eye exam  Call back number [phone]

## 2013-12-10 NOTE — Telephone Encounter (Signed)
Approved. Please place order for the referral.

## 2013-12-12 NOTE — Telephone Encounter (Signed)
Referral initiated

## 2013-12-13 ENCOUNTER — Encounter: Payer: Self-pay | Admitting: Family Medicine

## 2013-12-13 DIAGNOSIS — E118 Type 2 diabetes mellitus with unspecified complications: Secondary | ICD-10-CM

## 2013-12-13 DIAGNOSIS — N182 Chronic kidney disease, stage 2 (mild): Secondary | ICD-10-CM

## 2013-12-22 ENCOUNTER — Other Ambulatory Visit: Payer: Self-pay

## 2013-12-22 MED ORDER — OMEPRAZOLE 40 MG PO CPDR: 40.0000 mg | DELAYED_RELEASE_CAPSULE | Freq: Every day | ORAL | Status: DC

## 2014-01-05 ENCOUNTER — Encounter (HOSPITAL_COMMUNITY): Payer: Self-pay | Admitting: Oncology

## 2014-01-19 NOTE — Progress Notes (Signed)
Kyle [name],Kyle BETH, PA-C [address] [place] [address]  ANEMIA-IRON DEFICIENCY - Plan: CBC with Differential, Ferritin, Ammonia  ANEMIA, VITAMIN B12 DEFICIENCY - Plan: CBC with Differential  HEPATIC CIRRHOSIS - Plan: CBC with Differential, Ammonia  Other chronic nonalcoholic liver disease  Thrombocytopenia due to hypersplenism - Plan: CBC with Differential  Upper GI bleed  Esophageal varices  Acute post-ligation esophageal ulcer with hemorrhage  Acute posthemorrhagic anemia  CURRENT THERAPY:Intermittent Feraheme 1020 mg IV PRN lab results. Last infusion was on 09/15/2013.  INTERVAL HISTORY: Kyle [name] 58 y.o. male returns for  regular  visit for followup of anemia of multiple mechanisms.  Kyle [name] saw Dr. Gala Romney (GI) on 11/29/2013.  He recommends the following management from a GI perspective. 1. Continue present medical regimen  2. We will refer you to the liver transplant clinic at [facility].  He had an appt with Dr. Suzi Roots at 1030 on 01/05/2014. 3. Hepatic ultrasound 02/2014  Kyle [name] saw Irven Coe, NP [facility] transplant clinic) on 01/05/2014.  According to her notes, "in reviewing his labs today, his MELD is quite low at 12, meaning he does not need to be evaluated for liver transplantation at this time. Currently he would be a poor candidate due to uncontrolled blood glucose and obesity. He has limited insight into his diabetes control."  She plans on seeing the patient in April 2015 for follow-up.  From a hematology standpoint, we reviewed his lab results.  I personally reviewed and went over laboratory results with the patient.  The results are noted within this dictation.  Kyle [name] lives a few complaints: 1. He questions whether he is a candidate for "diet pills"- I am not up-to-date on these medications and therefore I will defer this to his primary care physician. 2. New-onset of hand tremors. Not sure of the cause. I reviewed his medications in this does not appear be  medication induced. A custom available today.  Hematologically, the patient denies any complaints and ROS questioning is negative. He states that his nurse practitioner for [facility] transplant clinic expressed concern regarding his iron treatment. I educated the patient that we do not anticipate iron overload as we allow his ferritin level to decrease prior to administering Feraheme. This will prevent iron overload and we do monitor his ferritin level regularly.  If his ferritin was noted to increase and remain above normal limits, we would have to address his IV iron therapy. This has not been the case to date.     Past Medical History  Diagnosis Date  . DM (diabetes mellitus)   . Cirrhosis     bx proven steatohepatitis with cirrhosis (2010); per note from Feb 2011, received Hep A and B vaccines in 2010. U/S on 09/05/13= no HCC  . HTN (hypertension)   . RLS (restless legs syndrome)   . Hyperlipidemia   . IDA (iron deficiency anemia)   . GERD (gastroesophageal reflux disease)   . Depression   . Peripheral neuropathy   . Urothelial cancer     2010, paillary low-grade, h/o recurrence 2011  . B12 deficiency   . Psoriasis   . 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 Anemia due to multiple mechanisms 05/13/2011  . History of alcohol abuse 05/13/2011  . ANEMIA-IRON DEFICIENCY 03/09/2009  . S/P endoscopy May 2012    4 columns grade II esophageal varices; due for repeat in Nov 2013   . S/P colonoscopy May 2012    Tubular adenoma  .  Low back pain   . Cancer     bladder ca 05/2009 removal and  w/chemo wash  . Esophageal varices with bleeding(456.0) 11/24/11    s/p emergent EGD 11/25/11 by Dr. Hilarie Fredrickson at [facility], Grade III esophageal varices s/p banding X 5  . Small bowel obstruction 02/15/2012    Admitted to [facility], managed by Dr. Geroge Baseman, ventral hernia manually reduced  . Ventral hernia 02/15/12  . MRSA (methicillin resistant Staphylococcus aureus)   . Acute post-ligation esophageal ulcer with  hemorrhage 04/11/2012  . Clostridium difficile infection   . H. pylori infection 12/24/2012  . Sleep apnea     [facility] took him off CPAP when had tips procedure  . Sleep apnea     has BLADDER CANCER; DIABETES MELLITUS, TYPE II, CONTROLLED, WITH COMPLICATIONS; ADRENAL MASS; HYPERLIPIDEMIA; ANEMIA-IRON DEFICIENCY; ANEMIA, VITAMIN B12 DEFICIENCY; SMOKER; DEPRESSION; SLEEP APNEA, OBSTRUCTIVE, MODERATE; RESTLESS LEG SYNDROME; HYPERTENSION; GERD; HEPATIC CIRRHOSIS; Other chronic nonalcoholic liver disease; ARTHRITIS; SHOULDER PAIN, LEFT; INSOMNIA; FATIGUE; WEIGHT GAIN; CHEST DISCOMFORT; PROTEINURIA; ABNORMAL ELECTROCARDIOGRAM; Thrombocytopenia due to hypersplenism; Anemia due to multiple mechanisms; History of alcohol abuse; Abdominal pain; External hemorrhoid; Upper GI bleed; Hypotension; Diarrhea; Esophageal varices; Umbilical hernia; History of small bowel obstruction; Acute post-ligation esophageal ulcer with hemorrhage; Acute posthemorrhagic anemia; Acute respiratory failure; UTI (urinary tract infection); Hyperkalemia; H. pylori infection; Microscopic hematuria; Hemorrhagic shock; Respiratory failure with hypercapnia; Aspiration pneumonia; SIRS (systemic inflammatory response syndrome); Seizure; Fever, unspecified; and Chronic kidney disease, stage II (mild) on his problem list.     is allergic to aspirin; ibuprofen; and tylenol.  Mr. Guglielmo does not currently have medications on file.  Past Surgical History  Procedure Laterality Date  . Carpel tunnel Right   . Shoulder surgery Left   . Adrenal mass surgery  05/2009    benign, left  . Bladder surgery  01/2009 and 06/2010    cancer 2010, small recurrence in 06/2010  . Colonoscopy  04/2009    moderate int hemorrhoids, rare sigmoid diverticula, one mm sessile hyperplastic rectal polyp  . Esophagogastroduodenoscopy  03/2009    small hh  . Small bowel capsule endoscopy  03/2009    couple of small benign appearing erosions, nonbleeding  . Egd/tcs  08/2007      small hiatal hernia, pancolonic diverticula, friable anal canal, 3cm salmon colored epithelium in distal esophagus, bx negative for Barrett's  . Skin cancer excision  Oct 2012    left arm  . Esophagogastroduodenoscopy  11/25/2011    Dr. Phineas Douglas III varices in the mid and distal esophagus, banding placed, portal astropathy  . Esophagogastroduodenoscopy  01/14/2012    Dr Rourk->4-5 columns Gr2 varices, 6 bands placed, HH, distal esophageal ulcer, portal gastropathy, antral erosions  . Esophagogastroduodenoscopy  03/17/2012    Dr. Larina Bras varices status post band ligation. Hiatal hernia. Portal gastropathy.  . Umbilical hernia repair  04/05/2012    Procedure: HERNIA REPAIR UMBILICAL ADULT;  Surgeon: Donato Heinz, MD;  Location: [facility];  Service: General;  Laterality: N/A;  . Esophagogastroduodenoscopy  04/11/2012    Dr. Bridgett Larsson ulcer possibly at previous banding sites with stigmata     of bleeding.  Attempt at hemostasis with hemoclip and banding was not successful resulting in recurrence of bleed/  Portal gastropathy, but no evidence of gastric varices/ Recurrent esophageal varices grade 2.  . Esophagogastroduodenoscopy  05/24/2012    Dr. Larina Bras varices, portal gastropathy  . Esophagogastroduodenoscopy  04/11/12    Dr. Drue Novel in the distal esophagus adherent lot and visible vessel treated with  bicap. grade I varices in the distal esophagus, one ligating band placed, portal hypertensive gastropathy in the body of the stomach.  . Colonoscopy  09/29/2012    NLZ:JQBHAL varices. Scattered pancolonic diverticula/single cecal polyp-removed as described above  . Esophagogastroduodenoscopy  12/24/2012    Dr. Lynwood Dawley significant upper GI bleed secondary to bleeding esophageal varices. s/p hemostasis therapy with injection of a total of 73m of 5% ethanolamine and application of 9 bands, complete egd not carried out.  . Schlerotherapy  12/24/2012     Procedure: SWoodward KuOF VARICES;  Surgeon: RDaneil Dolin MD;  Location: [facility];  Service: Endoscopy;  Laterality: N/A;  . Esophageal banding  12/24/2012    RPFX:[id number] upper GI bleed secondary to bleeding esophageal varices  . Tips procedure  12/2012    [facility]  . Cataract extraction, bilateral      Denies any headaches, dizziness, double vision, fevers, chills, night sweats, nausea, vomiting, diarrhea, constipation, chest pain, heart palpitations, shortness of breath, blood in stool, black tarry stool, urinary pain, urinary burning, urinary frequency, hematuria.   PHYSICAL EXAMINATION  ECOG PERFORMANCE STATUS: 1 - Symptomatic but completely ambulatory  Filed Vitals:   01/20/14 0929  BP: 124/70  Pulse: 81  Temp: 98 F (36.7 C)  Resp: 18    GENERAL:alert, no distress, well nourished, well developed, comfortable, cooperative, obese, smiling and chronically-ill appearing SKIN: skin color, texture, turgor are normal, no rashes or significant lesions HEAD: Normocephalic, No masses, lesions, tenderness or abnormalities EYES: normal, PERRLA, EOMI, Conjunctiva are pink and non-injected EARS: External ears normal OROPHARYNX:mucous membranes are moist  NECK: supple, no adenopathy, thyroid normal size, non-tender, without nodularity, no stridor, non-tender, trachea midline LYMPH:  no palpable lymphadenopathy BREAST:not examined LUNGS: clear to auscultation , decreased breath sounds HEART: regular rate & rhythm, no murmurs and no gallops ABDOMEN:non-tender, obese and normal bowel sounds BACK: Back symmetric, no curvature., No CVA tenderness EXTREMITIES:less then 2 second capillary refill, no joint deformities, effusion, or inflammation, no skin discoloration, no cyanosis  NEURO: alert & oriented x 3 with fluent speech, no focal motor/sensory deficits, gait normal    LABORATORY DATA: CBC    Component Value Date/Time   WBC 3.8* 10/24/2013 0906   RBC 4.76  10/24/2013 0906   RBC 3.97* 12/18/2010 1235   HGB 15.8 10/24/2013 0906   HCT 44.3 10/24/2013 0906   PLT 53* 10/24/2013 0906   MCV 93.1 10/24/2013 0906   MCH 33.2 10/24/2013 0906   MCHC 35.7 10/24/2013 0906   RDW 14.3 10/24/2013 0906   LYMPHSABS 1.2 10/24/2013 0906   MONOABS 0.3 10/24/2013 0906   EOSABS 0.1 10/24/2013 0906   BASOSABS 0.0 10/24/2013 0906      Chemistry      Component Value Date/Time   NA 137 08/31/2013 0620   K 4.2 08/31/2013 0620   CL 102 08/31/2013 0620   CO2 25 08/31/2013 0620   BUN 12 08/31/2013 0620   CREATININE 0.92 08/31/2013 0620   CREATININE 0.70 05/23/2013 0115      Component Value Date/Time   CALCIUM 9.7 08/31/2013 0620   ALKPHOS 126* 08/26/2013 1330   AST 52* 08/26/2013 1330   ALT 28 08/26/2013 1330   BILITOT 1.5* 08/26/2013 1330     Lab Results  Component Value Date   IRON 111 08/19/2013   TIBC 295 08/19/2013   FERRITIN 132 10/24/2013    PENDING LABS: CBC diff, Ferritin, Ammonia level   ASSESSMENT:  1. Multifactorial anemia, last Feraheme 1020 IV infusion  was on 03/24/2013.  2. Iron deficiency anemia requiring IV Feraheme infusions  3. Liver disease with fatty infiltration.  4. Hospitalization for seizure-like activity due to hyperglycemia in March 2014.  5. Recent Hospitalization from severe upper esophageal bleeding requiring multiple PRBC transfusions, intubation, and transfer to [facility] for TIPS procedure which was performed on 01/17/2013. He was in ICU at [facility] for over 20 days.  6. Obesity  7. Probably lung disease secondary to longstanding smoking history.  8. DM  9. Esophageal varices  10. RLS, failed Tramadol and Gabapentin.  11. Insomnia, on ambien and much improved. 12. Bilateral shoulder pain, suspect arthritis from previous trauma 13. Tremor, does not appear to be medication induced.  Will test ammonia level  Patient Active Problem List   Diagnosis Date Noted  . Chronic kidney disease, stage II (mild) 12/13/2013  . Fever, unspecified 02/05/2013  .  Seizure 02/03/2013  . Aspiration pneumonia 12/25/2012  . SIRS (systemic inflammatory response syndrome) 12/25/2012  . UTI (urinary tract infection) 12/24/2012  . Hyperkalemia 12/24/2012  . H. pylori infection 12/24/2012  . Microscopic hematuria 12/24/2012  . Hemorrhagic shock 12/24/2012  . Respiratory failure with hypercapnia 12/24/2012  . Acute post-ligation esophageal ulcer with hemorrhage 04/11/2012  . Acute posthemorrhagic anemia 04/11/2012  . Acute respiratory failure 04/11/2012  . Umbilical hernia 63/87/5643  . History of small bowel obstruction 03/03/2012  . Diarrhea 12/19/2011  . Esophageal varices 12/19/2011  . Upper GI bleed 11/25/2011  . Hypotension 11/25/2011  . Abdominal pain 11/04/2011  . External hemorrhoid 11/04/2011  . 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 Anemia due to multiple mechanisms 05/13/2011  . History of alcohol abuse 05/13/2011  . Other chronic nonalcoholic liver disease 32/95/1884  . DIABETES MELLITUS, TYPE II, CONTROLLED, WITH COMPLICATIONS 16/60/6301  . BLADDER CANCER 05/21/2009  . HEPATIC CIRRHOSIS 05/21/2009  . SHOULDER PAIN, LEFT 04/19/2009  . SMOKER 04/04/2009  . GERD 04/04/2009  . ARTHRITIS 04/04/2009  . ANEMIA-IRON DEFICIENCY 03/09/2009  . ANEMIA, VITAMIN B12 DEFICIENCY 03/09/2009  . CHEST DISCOMFORT 02/28/2009  . ADRENAL MASS 12/27/2008  . INSOMNIA 12/27/2008  . RESTLESS LEG SYNDROME 12/13/2008  . SLEEP APNEA, OBSTRUCTIVE, MODERATE 10/25/2008  . PROTEINURIA 10/06/2008  . HYPERLIPIDEMIA 10/04/2008  . DEPRESSION 10/04/2008  . HYPERTENSION 10/04/2008  . FATIGUE 10/04/2008  . WEIGHT GAIN 10/04/2008  . ABNORMAL ELECTROCARDIOGRAM 10/04/2008    PLAN:  1. I personally reviewed and went over laboratory results with the patient.  The results are noted within this dictation. 2. Chart reviewed 3. Care Everywhere chart reviewed. 4. [facility] notes reviewed. 5. Labs every 6 weeks: CBC diff, ferritin  6. Labs today: CBC diff,  Ferritin, ammonia level 7. Return in 4 months for follow-up   THERAPY PLAN:  From a Hematologic standpoint, we will continue to support his blood counts with supportive interventions.  We will monitor iron stores and provide IV Feraheme when treatment criteria are met.  All questions were answered. The patient knows to call the clinic with any problems, questions or concerns. We can certainly see the patient much sooner if necessary.  Patient and plan discussed with Dr. Farrel Gobble and he is in agreement with the aforementioned.   Anant Agard

## 2014-01-20 ENCOUNTER — Encounter (HOSPITAL_COMMUNITY): Payer: Medicaid Other | Attending: Oncology

## 2014-01-20 ENCOUNTER — Encounter (HOSPITAL_COMMUNITY): Payer: Self-pay | Admitting: Oncology

## 2014-01-20 ENCOUNTER — Encounter (HOSPITAL_BASED_OUTPATIENT_CLINIC_OR_DEPARTMENT_OTHER): Payer: Medicaid Other | Admitting: Oncology

## 2014-01-20 VITALS — BP 124/70 | HR 81 | Temp 98.0°F | Resp 18 | Wt 257.6 lb

## 2014-01-20 DIAGNOSIS — K7689 Other specified diseases of liver: Secondary | ICD-10-CM

## 2014-01-20 DIAGNOSIS — D6959 Other secondary thrombocytopenia: Secondary | ICD-10-CM

## 2014-01-20 DIAGNOSIS — D731 Hypersplenism: Secondary | ICD-10-CM

## 2014-01-20 DIAGNOSIS — D518 Other vitamin B12 deficiency anemias: Secondary | ICD-10-CM

## 2014-01-20 DIAGNOSIS — K7460 Unspecified cirrhosis of liver: Secondary | ICD-10-CM

## 2014-01-20 DIAGNOSIS — N183 Chronic kidney disease, stage 3 unspecified: Secondary | ICD-10-CM

## 2014-01-20 DIAGNOSIS — E119 Type 2 diabetes mellitus without complications: Secondary | ICD-10-CM

## 2014-01-20 DIAGNOSIS — G4700 Insomnia, unspecified: Secondary | ICD-10-CM

## 2014-01-20 DIAGNOSIS — R259 Unspecified abnormal involuntary movements: Secondary | ICD-10-CM

## 2014-01-20 DIAGNOSIS — K2211 Ulcer of esophagus with bleeding: Secondary | ICD-10-CM

## 2014-01-20 DIAGNOSIS — D509 Iron deficiency anemia, unspecified: Secondary | ICD-10-CM

## 2014-01-20 DIAGNOSIS — I8500 Esophageal varices without bleeding: Secondary | ICD-10-CM

## 2014-01-20 DIAGNOSIS — D62 Acute posthemorrhagic anemia: Secondary | ICD-10-CM

## 2014-01-20 DIAGNOSIS — K922 Gastrointestinal hemorrhage, unspecified: Secondary | ICD-10-CM

## 2014-01-20 DIAGNOSIS — G2581 Restless legs syndrome: Secondary | ICD-10-CM

## 2014-01-20 DIAGNOSIS — M25519 Pain in unspecified shoulder: Secondary | ICD-10-CM

## 2014-01-20 DIAGNOSIS — K769 Liver disease, unspecified: Secondary | ICD-10-CM

## 2014-01-20 LAB — FERRITIN: Ferritin: 56 ng/mL (ref 22–322)

## 2014-01-20 LAB — AMMONIA: Ammonia: 103 umol/L — ABNORMAL HIGH (ref 11–60)

## 2014-01-20 LAB — CBC WITH DIFFERENTIAL/PLATELET
BASOS PCT: 0 % (ref 0–1)
Basophils Absolute: 0 10*3/uL (ref 0.0–0.1)
Eosinophils Absolute: 0.1 10*3/uL (ref 0.0–0.7)
Eosinophils Relative: 3 % (ref 0–5)
HCT: 43 % (ref 39.0–52.0)
Hemoglobin: 15.2 g/dL (ref 13.0–17.0)
Lymphocytes Relative: 31 % (ref 12–46)
Lymphs Abs: 1.4 10*3/uL (ref 0.7–4.0)
MCH: 32.3 pg (ref 26.0–34.0)
MCHC: 35.3 g/dL (ref 30.0–36.0)
MCV: 91.3 fL (ref 78.0–100.0)
MONO ABS: 0.4 10*3/uL (ref 0.1–1.0)
Monocytes Relative: 9 % (ref 3–12)
NEUTROS PCT: 58 % (ref 43–77)
Neutro Abs: 2.7 10*3/uL (ref 1.7–7.7)
Platelets: 63 10*3/uL — ABNORMAL LOW (ref 150–400)
RBC: 4.71 MIL/uL (ref 4.22–5.81)
RDW: 13.6 % (ref 11.5–15.5)
WBC: 4.6 10*3/uL (ref 4.0–10.5)

## 2014-01-20 LAB — HEMOGLOBIN A1C
HEMOGLOBIN A1C: 10.6 % — AB (ref <5.7)
Mean Plasma Glucose: 258 mg/dL — ABNORMAL HIGH (ref <117)

## 2014-01-20 NOTE — Progress Notes (Signed)
Labs drawn today for hgbA1C,cbc/diff,ferr

## 2014-01-20 NOTE — Addendum Note (Signed)
Addended byLouis Meckel on: 01/20/2014 09:56 AM   Modules accepted: Orders

## 2014-01-20 NOTE — Patient Instructions (Signed)
[facility] Discharge Instructions  RECOMMENDATIONS MADE BY THE CONSULTANT AND ANY TEST RESULTS WILL BE SENT TO YOUR REFERRING PHYSICIAN.  EXAM FINDINGS BY THE PHYSICIAN TODAY AND SIGNS OR SYMPTOMS TO REPORT TO CLINIC OR PRIMARY PHYSICIAN:   Labs every 6 weeks  Return in 4 months for follow up with Kirby Crigler PA-C   Thank you for choosing [facility] to provide your oncology and hematology care.  To afford each patient quality time with our providers, please arrive at least 15 minutes before your scheduled appointment time.  With your help, our goal is to use those 15 minutes to complete the necessary work-up to ensure our physicians have the information they need to help with your evaluation and healthcare recommendations.    Effective January 1st, 2014, we ask that you re-schedule your appointment with our physicians should you arrive 10 or more minutes late for your appointment.  We strive to give you quality time with our providers, and arriving late affects you and other patients whose appointments are after yours.    Again, thank you for choosing [facility].  Our hope is that these requests will decrease the amount of time that you wait before being seen by our physicians.       _____________________________________________________________  Should you have questions after your visit to [facility], please contact our office at [phone] between the hours of 8:30 a.m. and 5:00 p.m.  Voicemails left after 4:30 p.m. will not be returned until the following business day.  For prescription refill requests, have your pharmacy contact our office with your prescription refill request.

## 2014-01-25 ENCOUNTER — Encounter (HOSPITAL_COMMUNITY): Payer: Self-pay | Admitting: Emergency Medicine

## 2014-01-25 ENCOUNTER — Encounter: Payer: Self-pay | Admitting: Gastroenterology

## 2014-01-25 ENCOUNTER — Ambulatory Visit (INDEPENDENT_AMBULATORY_CARE_PROVIDER_SITE_OTHER): Payer: Medicaid Other | Admitting: Gastroenterology

## 2014-01-25 ENCOUNTER — Ambulatory Visit (HOSPITAL_COMMUNITY)
Admission: RE | Admit: 2014-01-25 | Discharge: 2014-01-25 | Disposition: A | Discharge status: Home / Self Care | Payer: Medicaid Other | Source: Ambulatory Visit | Attending: Gastroenterology | Admitting: Gastroenterology

## 2014-01-25 ENCOUNTER — Emergency Department (HOSPITAL_COMMUNITY)
Admission: EM | Admit: 2014-01-25 | Discharge: 2014-01-25 | Disposition: A | Discharge status: Home / Self Care | Payer: Medicaid Other | Attending: Emergency Medicine | Admitting: Emergency Medicine

## 2014-01-25 VITALS — BP 158/89 | HR 82 | Temp 97.3°F | Ht 67.0 in | Wt 250.2 lb

## 2014-01-25 DIAGNOSIS — Z79899 Other long term (current) drug therapy: Secondary | ICD-10-CM | POA: Insufficient documentation

## 2014-01-25 DIAGNOSIS — K429 Umbilical hernia without obstruction or gangrene: Secondary | ICD-10-CM

## 2014-01-25 DIAGNOSIS — I10 Essential (primary) hypertension: Secondary | ICD-10-CM | POA: Insufficient documentation

## 2014-01-25 DIAGNOSIS — Z862 Personal history of diseases of the blood and blood-forming organs and certain disorders involving the immune mechanism: Secondary | ICD-10-CM | POA: Insufficient documentation

## 2014-01-25 DIAGNOSIS — Z792 Long term (current) use of antibiotics: Secondary | ICD-10-CM | POA: Insufficient documentation

## 2014-01-25 DIAGNOSIS — Z872 Personal history of diseases of the skin and subcutaneous tissue: Secondary | ICD-10-CM | POA: Insufficient documentation

## 2014-01-25 DIAGNOSIS — E119 Type 2 diabetes mellitus without complications: Secondary | ICD-10-CM | POA: Insufficient documentation

## 2014-01-25 DIAGNOSIS — K219 Gastro-esophageal reflux disease without esophagitis: Secondary | ICD-10-CM | POA: Insufficient documentation

## 2014-01-25 DIAGNOSIS — Z8614 Personal history of Methicillin resistant Staphylococcus aureus infection: Secondary | ICD-10-CM | POA: Insufficient documentation

## 2014-01-25 DIAGNOSIS — Z794 Long term (current) use of insulin: Secondary | ICD-10-CM | POA: Insufficient documentation

## 2014-01-25 DIAGNOSIS — K7460 Unspecified cirrhosis of liver: Secondary | ICD-10-CM | POA: Insufficient documentation

## 2014-01-25 DIAGNOSIS — Z8619 Personal history of other infectious and parasitic diseases: Secondary | ICD-10-CM | POA: Insufficient documentation

## 2014-01-25 DIAGNOSIS — R1033 Periumbilical pain: Secondary | ICD-10-CM

## 2014-01-25 DIAGNOSIS — R109 Unspecified abdominal pain: Secondary | ICD-10-CM | POA: Insufficient documentation

## 2014-01-25 DIAGNOSIS — R197 Diarrhea, unspecified: Secondary | ICD-10-CM | POA: Insufficient documentation

## 2014-01-25 DIAGNOSIS — E538 Deficiency of other specified B group vitamins: Secondary | ICD-10-CM | POA: Insufficient documentation

## 2014-01-25 DIAGNOSIS — Z8551 Personal history of malignant neoplasm of bladder: Secondary | ICD-10-CM | POA: Insufficient documentation

## 2014-01-25 DIAGNOSIS — F17200 Nicotine dependence, unspecified, uncomplicated: Secondary | ICD-10-CM | POA: Insufficient documentation

## 2014-01-25 DIAGNOSIS — Z9889 Other specified postprocedural states: Secondary | ICD-10-CM | POA: Insufficient documentation

## 2014-01-25 LAB — COMPREHENSIVE METABOLIC PANEL
ALBUMIN: 3.1 g/dL — AB (ref 3.5–5.2)
ALK PHOS: 103 U/L (ref 39–117)
ALT: 128 U/L — AB (ref 0–53)
AST: 339 U/L — ABNORMAL HIGH (ref 0–37)
BILIRUBIN TOTAL: 1.8 mg/dL — AB (ref 0.3–1.2)
BUN: 14 mg/dL (ref 6–23)
CHLORIDE: 97 meq/L (ref 96–112)
CO2: 20 mEq/L (ref 19–32)
Calcium: 9.1 mg/dL (ref 8.4–10.5)
Creatinine, Ser: 0.97 mg/dL (ref 0.50–1.35)
GFR calc Af Amer: >90 mL/min (ref >90)
GFR calc non Af Amer: >90 mL/min (ref >90)
Glucose, Bld: 330 mg/dL — ABNORMAL HIGH (ref 70–99)
POTASSIUM: 4.9 meq/L (ref 3.7–5.3)
SODIUM: 130 meq/L — AB (ref 137–147)
TOTAL PROTEIN: 8.5 g/dL — AB (ref 6.0–8.3)

## 2014-01-25 LAB — CBC WITH DIFFERENTIAL/PLATELET
Basophils Absolute: 0 10*3/uL (ref 0.0–0.1)
Basophils Relative: 0 % (ref 0–1)
EOS ABS: 0 10*3/uL (ref 0.0–0.7)
Eosinophils Relative: 1 % (ref 0–5)
HEMATOCRIT: 47.6 % (ref 39.0–52.0)
Hemoglobin: 16.5 g/dL (ref 13.0–17.0)
Lymphocytes Relative: 28 % (ref 12–46)
Lymphs Abs: 0.8 10*3/uL (ref 0.7–4.0)
MCH: 31.8 pg (ref 26.0–34.0)
MCHC: 34.7 g/dL (ref 30.0–36.0)
MCV: 91.7 fL (ref 78.0–100.0)
Monocytes Absolute: 0.4 10*3/uL (ref 0.1–1.0)
Monocytes Relative: 14 % — ABNORMAL HIGH (ref 3–12)
NEUTROS ABS: 1.5 10*3/uL — AB (ref 1.7–7.7)
Neutrophils Relative %: 57 % (ref 43–77)
Platelets: 65 10*3/uL — ABNORMAL LOW (ref 150–400)
RBC: 5.19 MIL/uL (ref 4.22–5.81)
RDW: 14 % (ref 11.5–15.5)
WBC: 2.7 10*3/uL — ABNORMAL LOW (ref 4.0–10.5)

## 2014-01-25 LAB — PROTIME-INR
INR: 1.26 (ref 0.00–1.49)
PROTHROMBIN TIME: 15.5 s — AB (ref 11.6–15.2)

## 2014-01-25 MED ORDER — IOHEXOL 300 MG/ML  SOLN
100.0000 mL | Freq: Once | INTRAMUSCULAR | Status: AC | PRN
  Administered 2014-01-25: 100 mL via INTRAVENOUS

## 2014-01-25 NOTE — Discharge Instructions (Signed)
Hernia A hernia occurs when an internal organ pushes out through a weak spot in the abdominal wall. Hernias most commonly occur in the groin and around the navel. Hernias often can be pushed back into place (reduced). Most hernias tend to get worse over time. Some abdominal hernias can get stuck in the opening (irreducible or incarcerated hernia) and cannot be reduced. An irreducible abdominal hernia which is tightly squeezed into the opening is at risk for impaired blood supply (strangulated hernia). A strangulated hernia is a medical emergency. Because of the risk for an irreducible or strangulated hernia, surgery may be recommended to repair a hernia. CAUSES   Heavy lifting.  Prolonged coughing.  Straining to have a bowel movement.  A cut (incision) made during an abdominal surgery. HOME CARE INSTRUCTIONS   Bed rest is not required. You may continue your normal activities.  Avoid lifting more than 10 pounds (4.5 kg) or straining.  Cough gently. If you are a smoker it is best to stop. Even the best hernia repair can break down with the continual strain of coughing. Even if you do not have your hernia repaired, a cough will continue to aggravate the problem.  Do not wear anything tight over your hernia. Do not try to keep it in with an outside bandage or truss. These can damage abdominal contents if they are trapped within the hernia sac.  Eat a normal diet.  Avoid constipation. Straining over long periods of time will increase hernia size and encourage breakdown of repairs. If you cannot do this with diet alone, stool softeners may be used. SEEK IMMEDIATE MEDICAL CARE IF:   You have a fever.  You develop increasing abdominal pain.  You feel nauseous or vomit.  Your hernia is stuck outside the abdomen, looks discolored, feels hard, or is tender.  You have any changes in your bowel habits or in the hernia that are unusual for you.  You have increased pain or swelling around the  hernia.  You cannot push the hernia back in place by applying gentle pressure while lying down. MAKE SURE YOU:   Understand these instructions.  Will watch your condition.  Will get help right away if you are not doing well or get worse. Document Released: 11/10/2005 Document Revised: 02/02/2012 Document Reviewed: 06/29/2008 [facility] Patient Information 2014 [place].  Cirrhosis Cirrhosis is a condition of scarring of the liver which is caused when the liver has tried repairing itself following damage. This damage may come from a previous infection such as one of the forms of hepatitis (usually hepatitis C), or the damage may come from being injured by toxins. The main toxin that causes this damage is alcohol. The scarring of the liver from use of alcohol is irreversible. That means the liver cannot return to normal even though alcohol is not used any more. The main danger of hepatitis C infection is that it may cause long-lasting (chronic) liver disease, and this also may lead to cirrhosis. This complication is progressive and irreversible. CAUSES  Prior to available blood tests, hepatitis C could be contracted by blood transfusions. Since testing of blood has improved, this is now unlikely. This infection can also be contracted through intravenous drug use and the sharing of needles. It can also be contracted through sexual relationships. The injury caused by alcohol comes from too much use. It is not a few drinks that poison the liver, but years of misuse. Usually there will be some signs and symptoms early with scarring  of the liver that suggest the development of better habits. Alcohol should never be used while using acetaminophen. A small dose of both taken together may cause irreversible damage to the liver. HOME CARE INSTRUCTIONS  There is no specific treatment for cirrhosis. However, there are things you can do to avoid making the condition worse.  Rest as needed.  Eat a  well-balanced diet. Your caregiver can help you with suggestions.  Vitamin supplements including vitamins A, K, D, and thiamine can help.  A low-salt diet, water restriction, or diuretic medicine may be needed to reduce fluid retention.  Avoid alcohol. This can be extremely toxic if combined with acetaminophen.  Avoid drugs which are toxic to the liver. Some of these include isoniazid, methyldopa, acetaminophen, anabolic steroids (muscle-building drugs), erythromycin, and oral contraceptives (birth control pills). Check with your caregiver to make sure medicines you are presently taking will not be harmful.  Periodic blood tests may be required. Follow your caregiver's advice regarding the timing of these.  Milk thistle is an herbal remedy which does protect the liver against toxins. However, it will not help once the liver has been scarred. SEEK MEDICAL CARE IF:  You have increasing fatigue or weakness.  You develop swelling of the hands, feet, legs, or face.  You vomit bright red blood, or a coffee ground appearing material.  You have blood in your stools, or the stools turn black and tarry.  You have a fever.  You develop loss of appetite, or have nausea and vomiting.  You develop jaundice.  You develop easy bruising or bleeding.  You have worsening of any of the problems you are concerned about. Document Released: 11/10/2005 Document Revised: 02/02/2012 Document Reviewed: 06/28/2008 [facility] Patient Information 2014 [place].

## 2014-01-25 NOTE — Assessment & Plan Note (Signed)
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 More pressing, patient has acute onset of periumbilical abdominal pain, 7 out of 10, associated with episode of vomiting and loose stool. On exam, he has periumbilical tender mass, ?incarcerated hernia. Recommend urgent CT A/P for further evaluation. Discussed with radiology.   With regards to hand tremor, doubt any relation to ammonia level. Clinically patient has had no other signs of hepatic encephalopathy. No change in lactulose or Xifaxan at this time. Will discuss further with Dr. Gala Romney.   OV in 02/2014 as planned.  Obtain records from [facility] liver clinic.

## 2014-01-25 NOTE — ED Notes (Signed)
Pt alert & oriented x4, stable gait. Patient given discharge instructions, paperwork & prescription(s). Patient  instructed to stop at the registration desk to finish any additional paperwork. Patient verbalized understanding. Pt left department w/ no further questions. 

## 2014-01-25 NOTE — ED Notes (Signed)
Sent from radiology for evaluation of hernia

## 2014-01-25 NOTE — ED Provider Notes (Signed)
CSN: [id number]     Arrival date & time 01/25/14  1629 History   First MD Initiated Contact with Patient 01/25/14 1832     Chief Complaint  Patient presents with  . Hernia     (Consider location/radiation/quality/duration/timing/severity/associated sxs/prior Treatment) The history is provided by the patient.   patient was following up with gastroenterology for elevated ammonia. He has had cirrhosis and had a relatively recent TIPS procedure. He has had some nausea vomiting and diarrhea. He has a known umbilical hernia he states he presses in sometimes. It had been more painful. Patient thought that he was vomiting 2 to the stomach bugs his been going around. Gastroenterology after an office visit today ordered a CT scan, it showed an obstructed umbilical hernia. Partial small bowel obstruction. He was sent in the to the ER for further evaluation and treatment.  Past Medical History  Diagnosis Date  . DM (diabetes mellitus)   . Cirrhosis     bx proven steatohepatitis with cirrhosis (2010); per note from Feb 2011, received Hep A and B vaccines in 2010. U/S on 09/05/13= no HCC  . HTN (hypertension)   . RLS (restless legs syndrome)   . Hyperlipidemia   . IDA (iron deficiency anemia)   . GERD (gastroesophageal reflux disease)   . Depression   . Peripheral neuropathy   . Urothelial cancer     2010, paillary low-grade, h/o recurrence 2011  . B12 deficiency   . Psoriasis   . 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 Anemia due to multiple mechanisms 05/13/2011  . History of alcohol abuse 05/13/2011  . ANEMIA-IRON DEFICIENCY 03/09/2009  . S/P endoscopy May 2012    4 columns grade II esophageal varices; due for repeat in Nov 2013   . S/P colonoscopy May 2012    Tubular adenoma  . Low back pain   . Cancer     bladder ca 05/2009 removal and  w/chemo wash  . Esophageal varices with bleeding(456.0) 11/24/11    s/p emergent EGD 11/25/11 by Dr. Rhea Belton at [facility], Grade III esophageal varices  s/p banding X 5  . Small bowel obstruction 02/15/2012    Admitted to [facility], managed by Dr. Leticia Penna, ventral hernia manually reduced  . Ventral hernia 02/15/12  . MRSA (methicillin resistant Staphylococcus aureus)   . Acute post-ligation esophageal ulcer with hemorrhage 04/11/2012  . Clostridium difficile infection   . H. pylori infection 12/24/2012  . Sleep apnea     [facility] took him off CPAP when had tips procedure  . Sleep apnea    Past Surgical History  Procedure Laterality Date  . Carpel tunnel Right   . Shoulder surgery Left   . Adrenal mass surgery  05/2009    benign, left  . Bladder surgery  01/2009 and 06/2010    cancer 2010, small recurrence in 06/2010  . Colonoscopy  04/2009    moderate int hemorrhoids, rare sigmoid diverticula, one mm sessile hyperplastic rectal polyp  . Esophagogastroduodenoscopy  03/2009    small hh  . Small bowel capsule endoscopy  03/2009    couple of small benign appearing erosions, nonbleeding  . Egd/tcs  08/2007    small hiatal hernia, pancolonic diverticula, friable anal canal, 3cm salmon colored epithelium in distal esophagus, bx negative for Barrett's  . Skin cancer excision  Oct 2012    left arm  . Esophagogastroduodenoscopy  11/25/2011    Dr. Vedia Coffer III varices in the mid and distal esophagus, banding placed, portal astropathy  .  Esophagogastroduodenoscopy  01/14/2012    Dr Rourk->4-5 columns Gr2 varices, 6 bands placed, HH, distal esophageal ulcer, portal gastropathy, antral erosions  . Esophagogastroduodenoscopy  03/17/2012    Dr. Rito Ehrlichourk-Esophageal varices status post band ligation. Hiatal hernia. Portal gastropathy.  . Umbilical hernia repair  04/05/2012    Procedure: HERNIA REPAIR UMBILICAL ADULT;  Surgeon: Fabio BeringBrent C Ziegler, MD;  Location: [facility];  Service: General;  Laterality: N/A;  . Esophagogastroduodenoscopy  04/11/2012    Dr. Lysle Pearlehman-Esophageal ulcer possibly at previous banding sites with stigmata     of bleeding.  Attempt at hemostasis with  hemoclip and banding was not successful resulting in recurrence of bleed/  Portal gastropathy, but no evidence of gastric varices/ Recurrent esophageal varices grade 2.  . Esophagogastroduodenoscopy  05/24/2012    Dr. Rito Ehrlichourk-esophageal varices, portal gastropathy  . Esophagogastroduodenoscopy  04/11/12    Dr. Lorella NimrodGessner-ulcer in the distal esophagus adherent lot and visible vessel treated with bicap. grade I varices in the distal esophagus, one ligating band placed, portal hypertensive gastropathy in the body of the stomach.  . Colonoscopy  09/29/2012    ZOX:[id number]:Rectal varices. Scattered pancolonic diverticula/single cecal polyp-removed as described above  . Esophagogastroduodenoscopy  12/24/2012    Dr. Norvel Richardsourk-hemodynamically significant upper GI bleed secondary to bleeding esophageal varices. s/p hemostasis therapy with injection of a total of 9mL of 5% ethanolamine and application of 9 bands, complete egd not carried out.  . Schlerotherapy  12/24/2012    Procedure: Theresia MajorsSCHLEROTHERAPY OF VARICES;  Surgeon: Corbin Adeobert M Rourk, MD;  Location: [facility]ITE;  Service: Endoscopy;  Laterality: N/A;  . Esophageal banding  12/24/2012    WUJ:[id number]:Hemodynamically significant upper GI bleed secondary to bleeding esophageal varices  . Tips procedure  12/2012    [facility]  . Cataract extraction, bilateral     Family History  Problem Relation Age of Onset  . Cirrhosis Father     etoh  . Colon cancer Neg Hx   . Anesthesia problems Neg Hx   . Hypotension Neg Hx   . Malignant hyperthermia Neg Hx   . Pseudochol deficiency Neg Hx   . Kidney cancer Mother   . Cancer Mother   . HIV Brother   . Cirrhosis Brother     nash   History  Substance Use Topics  . Smoking status: Current Every Day Smoker -- 1.50 packs/day for 30 years    Types: Cigarettes  . Smokeless tobacco: Never Used  . Alcohol Use: No     Comment: drank heavily for few years in 20s    Review of Systems  Constitutional: Negative for activity change and appetite  change.  Eyes: Negative for pain.  Respiratory: Negative for chest tightness and shortness of breath.   Cardiovascular: Negative for chest pain and leg swelling.  Gastrointestinal: Positive for nausea, vomiting, abdominal pain, diarrhea and abdominal distention.  Genitourinary: Negative for flank pain.  Musculoskeletal: Negative for back pain and neck stiffness.  Skin: Negative for rash.  Neurological: Negative for weakness, numbness and headaches.  Psychiatric/Behavioral: Negative for behavioral problems.      Allergies  Aspirin; Ibuprofen; and Tylenol  Home Medications   Current Outpatient Rx  Name  Route  Sig  Dispense  Refill  . b complex vitamins tablet   Oral   Take 1 tablet by mouth daily.         . carvedilol (COREG) 6.25 MG tablet   Oral   Take 6.25 mg by mouth 2 (two) times daily with a meal.         .  gabapentin (NEURONTIN) 300 MG capsule   Oral   Take 600 mg by mouth 2 (two) times daily. 2 capsules 2 times daily         . insulin regular human CONCENTRATED (HUMULIN R) 500 UNIT/ML SOLN injection   Subcutaneous   Inject 32 Units into the skin 3 (three) times daily with meals.         . lactulose (CHRONULAC) 10 GM/15ML solution   Oral   Take 3.33 g by mouth daily.          [name] omeprazole (PRILOSEC) 40 MG capsule   Oral   Take 1 capsule (40 mg total) by mouth daily.   30 capsule   11   . rifaximin (XIFAXAN) 550 MG TABS tablet   Oral   Take 1 tablet (550 mg total) by mouth 2 (two) times daily.   60 tablet   11   . zolpidem (AMBIEN) 5 MG tablet   Oral   Take 1-2 tablets (5-10 mg total) by mouth at bedtime as needed for sleep.   60 tablet   1    BP 143/83  Pulse 84  Temp(Src) 98.2 F (36.8 C) (Oral)  Resp 18  Ht 5\' 7"  (1.702 m)  Wt 252 lb (114.306 kg)  BMI 39.46 kg/m2  SpO2 97% Physical Exam  Nursing note and vitals reviewed. Constitutional: He is oriented to person, place, and time. He appears well-developed and well-nourished.   HENT:  Head: Normocephalic and atraumatic.  Eyes: EOM are normal. Pupils are equal, round, and reactive to light.  Neck: Normal range of motion. Neck supple.  Cardiovascular: Normal rate, regular rhythm and normal heart sounds.   No murmur heard. Pulmonary/Chest: Effort normal and breath sounds normal.  Abdominal: Soft. Bowel sounds are normal. He exhibits mass. He exhibits no distension. There is tenderness. There is no rebound and no guarding.  Somewhat painful hernia just superior to umbilicus. Mild distention of abdomen.  Musculoskeletal: Normal range of motion. He exhibits edema.  Mild bilateral lower extremity pitting edema.  Neurological: He is alert and oriented to person, place, and time. No cranial nerve deficit.  Skin: Skin is warm and dry.  Psychiatric: He has a normal mood and affect.    ED Course  Procedures (including critical care time) Labs Review Labs Reviewed  CBC WITH DIFFERENTIAL - Abnormal; Notable for the following:    WBC 2.7 (*)    Platelets 65 (*)    Monocytes Relative 14 (*)    Neutro Abs 1.5 (*)    All other components within normal limits  COMPREHENSIVE METABOLIC PANEL - Abnormal; Notable for the following:    Sodium 130 (*)    Glucose, Bld 330 (*)    Total Protein 8.5 (*)    Albumin 3.1 (*)    AST 339 (*)    ALT 128 (*)    Total Bilirubin 1.8 (*)    All other components within normal limits  PROTIME-INR - Abnormal; Notable for the following:    Prothrombin Time 15.5 (*)    All other components within normal limits   Imaging Review Ct Abdomen Pelvis W Contrast  01/25/2014   CLINICAL DATA:  Kyle [name] history periumbilical pain  EXAM: CT ABDOMEN AND PELVIS WITH CONTRAST  TECHNIQUE: Multidetector CT imaging of the abdomen and pelvis was performed using the standard protocol following bolus administration of intravenous contrast.  CONTRAST:  123mL OMNIPAQUE IOHEXOL 300 MG/ML  SOLN  COMPARISON:  US ABDOMEN COMPLETE dated 09/05/2013; CT ABD/PELV  WO CM dated  06/25/2012  FINDINGS: Again demonstrated is an irregular surface of the liver. There is a TIPS porto -caval shunt in place. The liver is normal in size. There is no suspicious mass. There is a sub cm diameter hypodensity in the right lobe consistent with a cyst. The spleen remains enlarged. Numerous venous collaterals are present in the retroperitoneum as well as within the peritoneal space consistent with portal hypertension.  The partially distended stomach is grossly normal. The pancreas exhibits no focal mass. There are prominent retroperitoneal venous collaterals. There are no adrenal masses. The kidneys enhance well. There is incomplete rotation of the left kidney. The caliber of the abdominal aorta is normal.  A loop of small bowel extends into the ventral hernia. Proximal to this the bowel is mildly distended with fluid and some gas. Distal to this the small bowel is collapsed. The colon exhibits no acute abnormality. There is sigmoid diverticulosis. The urinary bladder and prostate gland and seminal vesicles appear normal. There is no significant inguinal hernia.  The lumbar vertebral bodies are preserved in height. There is disc space narrowing at L5-S1. The bony pelvis exhibits no lytic nor blastic lesion. The lung bases are clear.  IMPRESSION: 1. There is apparent incarceration of the small bowel loops within the ventral hernia resulting in a partial small bowel obstruction. The distal small bowel and colon exhibit no acute abnormalities. 2. There are findings consistent with cirrhosis with splenomegaly and numerous venous collaterals. A TIPS appliance is in place. 3. The gallbladder is adequately distended. I cannot exclude a few tiny stones near the gallbladder neck. 4. No acute urinary tract abnormality is demonstrated. 5. There is no evidence of ascites. 6. A preliminary report was called to the referring physician by the CT technologist at approximately 04:05 p.m. on January 25, 2013.   Electronically  Signed   By: David  Martinique   On: 01/25/2014 16:00     EKG Interpretation None      MDM   Final diagnoses:  Umbilical hernia  Cirrhosis    Patient presents with incarcerated umbilical hernia. There is partial small bowel obstruction with it. I reduced the hernia with some gentle pressure. Patient was monitored states he felt much better. He states he was hungry and he was able to tolerate orals. He was given an abdominal binder to help keep the hernia in. He has been seen by general surgery in the past, however they were hesitant to operate due to his overall status. His LFTs are mildly elevated compared to previous. He is not encephalopathic. He will give gastroenterology to call tomorrow. He was instructed to return for pain with a hernia or persistent vomiting.    Jasper Riling. Alvino Chapel, MD 01/25/14 2029

## 2014-01-25 NOTE — Patient Instructions (Signed)
1. CT scan. Stay at hospital until we have given further instructions.

## 2014-01-25 NOTE — Progress Notes (Signed)
Primary Care Physician: Kyle Juba, PA-C  Primary Gastroenterologist:  Kyle Cornea, MD   Chief Complaint  Patient presents with  . Abnormal Lab    ammonia    HPI: Kyle [name] is a 58 y.o. male here for urgent office visit for further evaluation arising ammonia level. Patient seen recently by Kyle Pane, PA-C for followup of iron deficiency anemia. He had mentioned new onset hand tremors which he was concerned about. Ammonia level was drawn was 103. He has had levels in the 60-90 range previously.   Patient states that he has had chronic tremors predominantly in the left hand that seems to be worse lately. He notes it more when he tries to do a task such as hold a utensil. He is left-hand predominant. He denies any episodes of confusion, lethargy. He was seen at [facility] liver transplant clinic recently. We have requested records. According to Mr. Kyle [name]' note, there is plans for followup in April but at this time liver transplant not felt to be needed in the near future with a current MELD score of 12. His obesity and uncontrolled diabetes will be negative factors towards candidacy.  Currently, patient complains of recent onset vomiting, diarrhea, abdominal pain. Denies any recent antibiotic use or ill contacts. He had a single episode of vomiting a couple of days ago. Since that time he has had multiple loose stools daily and complains of. Umbilical abdominal pain at the site of his known hernia. Pain is somewhat improved after bowel movement but quickly returns. Denies melena or rectal bleeding. Tolerating liquids only.   Lactulose decreased to 1 tsp per day by Kyle Medicus, NP at [facility] liver clinic.  Current Outpatient Prescriptions  Medication Sig Dispense Refill  . b complex vitamins tablet Take 1 tablet by mouth daily.      . carvedilol (COREG) 6.25 MG tablet TAKE ONE TABLET BY MOUTH TWICE DAILY WITH A MEAL  60 tablet  3  . gabapentin (NEURONTIN) 300 MG capsule Take  300 mg by mouth 2 (two) times daily. 2 capsules 2 times daily      . insulin regular (NOVOLIN R,HUMULIN R) 100 units/mL injection Inject 32 Units into the skin 3 (three) times daily before meals.       . lactulose (CHRONULAC) 10 GM/15ML solution Take 5 g by mouth daily.       [name] omeprazole (PRILOSEC) 40 MG capsule Take 1 capsule (40 mg total) by mouth daily.  30 capsule  11  . rifaximin (XIFAXAN) 550 MG TABS tablet Take 1 tablet (550 mg total) by mouth 2 (two) times daily.  60 tablet  11  . zolpidem (AMBIEN) 5 MG tablet Take 1-2 tablets (5-10 mg total) by mouth at bedtime as needed for sleep.  60 tablet  1   No current facility-administered medications for this visit.    Allergies as of 01/25/2014 - Review Complete 01/25/2014  Allergen Reaction Noted  . Aspirin Other (See Comments) 12/23/2012  . Ibuprofen Other (See Comments) 05/13/2012  . Tylenol [acetaminophen] Other (See Comments) 07/17/2011    ROS:  General: Negative for anorexia, weight loss, fever, chills, fatigue, weakness. ENT: Negative for hoarseness, difficulty swallowing , nasal congestion. CV: Negative for chest pain, angina, palpitations, dyspnea on exertion, peripheral edema.  Respiratory: Negative for dyspnea at rest, dyspnea on exertion, cough, sputum, wheezing.  GI: See history of present illness. GU:  Negative for dysuria, hematuria, urinary incontinence, urinary frequency, nocturnal urination.  Endo: Negative for unusual  weight change.    Physical Examination:   BP 158/89  Pulse 82  Temp(Src) 97.3 F (36.3 C) (Oral)  Ht 5\' 7"  (1.702 m)  Wt 250 lb 3.2 oz (113.49 kg)  BMI 39.18 kg/m2  General: Appears very uncomfortable. no acute distress.  Eyes: No icterus. Mouth: Oropharyngeal mucosa moist and pink , no lesions erythema or exudate. Lungs: Clear to auscultation bilaterally.  Heart: Regular rate and rhythm, no murmurs rubs or gallops.  Abdomen: Bowel sounds are normal, lemon sized mass just right of the  umbilicus likely hernia. Cannot reduce. Very tender to palpation, nondistended, no hepatosplenomegaly or masses, no abdominal bruits, no rebound or guarding.   Extremities: No lower extremity edema. No clubbing or deformities. Neuro: Alert and oriented x 4   Skin: Warm and dry, no jaundice.   Psych: Alert and cooperative, normal mood and affect.  Labs:  Lab Results  Component Value Date   WBC 4.6 01/20/2014   HGB 15.2 01/20/2014   HCT 43.0 01/20/2014   MCV 91.3 01/20/2014   PLT 63* 01/20/2014   Lab Results  Component Value Date   FERRITIN 56 01/20/2014    Imaging Studies: No results found.

## 2014-01-26 ENCOUNTER — Telehealth: Payer: Self-pay

## 2014-01-26 DIAGNOSIS — K7460 Unspecified cirrhosis of liver: Secondary | ICD-10-CM

## 2014-01-26 NOTE — Telephone Encounter (Signed)
Noted. Glad he is feeling better.  His AST/ALT were up from baseline, ?stress from incarcerated hernia.  If he is feeling better today, with regards to abdominal pain and N/V/D, I would advise to repeat LFTs Monday and go from there.  I will see if Dr. Gala Romney wants to refer for elective hernia repair at medical center.

## 2014-01-26 NOTE — Telephone Encounter (Signed)
Tried to call pt- LMOM 

## 2014-01-26 NOTE — Progress Notes (Signed)
cc'd to pcp 

## 2014-01-26 NOTE — Telephone Encounter (Signed)
Pt called- he went to the ED yesterday after his CT as instructed, the ED physician reduced his hernia and gave him a binder to keep on it and pt stated he is feeling 100% better.  They asked him to call us this morning to find out what the next step should be.  They also drew blood and patients liver enzymes are significantly elevated and they wanted to make sure you knew about it.

## 2014-01-27 ENCOUNTER — Other Ambulatory Visit: Payer: Self-pay

## 2014-01-27 DIAGNOSIS — K7460 Unspecified cirrhosis of liver: Secondary | ICD-10-CM

## 2014-01-27 NOTE — Telephone Encounter (Signed)
Pt is aware. Lab order faxed to the lab. He said he continues to feel much better now.

## 2014-01-30 LAB — HEPATIC FUNCTION PANEL
ALT: 65 U/L — AB (ref 0–53)
AST: 99 U/L — AB (ref 0–37)
Albumin: 2.9 g/dL — ABNORMAL LOW (ref 3.5–5.2)
Alkaline Phosphatase: 157 U/L — ABNORMAL HIGH (ref 39–117)
BILIRUBIN INDIRECT: 0.9 mg/dL (ref 0.2–1.2)
Bilirubin, Direct: 0.2 mg/dL (ref 0.0–0.3)
Total Bilirubin: 1.1 mg/dL (ref 0.2–1.2)
Total Protein: 6.3 g/dL (ref 6.0–8.3)

## 2014-02-02 ENCOUNTER — Telehealth: Payer: Self-pay | Admitting: Internal Medicine

## 2014-02-02 NOTE — Telephone Encounter (Signed)
Routing to LSL 

## 2014-02-02 NOTE — Telephone Encounter (Signed)
Pt was calling to see if his blood work from Monday was back yet and if so please call him at [phone]

## 2014-02-02 NOTE — Telephone Encounter (Signed)
See result note.  

## 2014-02-03 NOTE — Telephone Encounter (Signed)
Called pt- LMOM 

## 2014-02-20 ENCOUNTER — Other Ambulatory Visit: Payer: Self-pay | Admitting: Family Medicine

## 2014-02-20 NOTE — Telephone Encounter (Signed)
Refill appropriate and filled per protocol. 

## 2014-03-01 ENCOUNTER — Telehealth: Payer: Self-pay | Admitting: Gastroenterology

## 2014-03-01 NOTE — Telephone Encounter (Signed)
Please let patient know, per RMR  1. Titrate lactulose to 3-4 soft stools daily. Continue Xifaxan. 2. He would recommend consider neurology consult for hand tremor. Not like due to hepatic encephalopathy.  3. Keep upcoming OV with RMR.

## 2014-03-01 NOTE — Telephone Encounter (Signed)
Message copied by Mahala Menghini on Wed Mar 01, 2014 11:37 AM ------      Message from: Daneil Dolin      Created: Fri Feb 03, 2014  8:50 AM       In my opinion, too many serum ammonia levels are drawn on pts .       If on Xifaxan and lactulose - titrated to about 3, maybe 4 semi-formed stools daily and patient stayes adequately hydrated, there's not much else to do for encephalopathy. This tremor may or may not be due to encephalopathy whether related directly to ammonia or not. If we have optimized treatment for his encephalopathy, then  I would recommend a neurological consultation            ----- Message -----         From: Mahala Menghini, PA-C         Sent: 02/02/2014  10:56 PM           To: Daneil Dolin, MD, Mahala Menghini, PA-C            Do you have any suggestions for this guy?      Tom sent him to use for ammonia of 100. Patient complains of hand tremor with holding utensils/writing (of left hand only/dominant hand).             I am inclined to think there is no correlation but are there any improvement we can make given elevated ammonia level is this pt with a TIPS? [facility] decreased his lactulose to 1tsp daily and he is on Xifaxin. This is not acute change with hand tremor.        ------

## 2014-03-02 ENCOUNTER — Encounter (HOSPITAL_COMMUNITY): Payer: Medicaid Other | Attending: Oncology

## 2014-03-02 DIAGNOSIS — D518 Other vitamin B12 deficiency anemias: Secondary | ICD-10-CM | POA: Insufficient documentation

## 2014-03-02 DIAGNOSIS — D509 Iron deficiency anemia, unspecified: Secondary | ICD-10-CM | POA: Insufficient documentation

## 2014-03-02 DIAGNOSIS — D6959 Other secondary thrombocytopenia: Secondary | ICD-10-CM | POA: Insufficient documentation

## 2014-03-02 DIAGNOSIS — N183 Chronic kidney disease, stage 3 unspecified: Secondary | ICD-10-CM

## 2014-03-02 DIAGNOSIS — K7460 Unspecified cirrhosis of liver: Secondary | ICD-10-CM | POA: Insufficient documentation

## 2014-03-02 DIAGNOSIS — D731 Hypersplenism: Secondary | ICD-10-CM

## 2014-03-02 DIAGNOSIS — K769 Liver disease, unspecified: Secondary | ICD-10-CM

## 2014-03-02 LAB — CBC WITH DIFFERENTIAL/PLATELET
Basophils Absolute: 0 10*3/uL (ref 0.0–0.1)
Basophils Relative: 0 % (ref 0–1)
EOS PCT: 1 % (ref 0–5)
Eosinophils Absolute: 0 10*3/uL (ref 0.0–0.7)
HCT: 40.1 % (ref 39.0–52.0)
Hemoglobin: 13.6 g/dL (ref 13.0–17.0)
LYMPHS ABS: 0.9 10*3/uL (ref 0.7–4.0)
Lymphocytes Relative: 29 % (ref 12–46)
MCH: 31.1 pg (ref 26.0–34.0)
MCHC: 33.9 g/dL (ref 30.0–36.0)
MCV: 91.8 fL (ref 78.0–100.0)
Monocytes Absolute: 0.3 10*3/uL (ref 0.1–1.0)
Monocytes Relative: 10 % (ref 3–12)
Neutro Abs: 1.8 10*3/uL (ref 1.7–7.7)
Neutrophils Relative %: 60 % (ref 43–77)
PLATELETS: 52 10*3/uL — AB (ref 150–400)
RBC: 4.37 MIL/uL (ref 4.22–5.81)
RDW: 14.2 % (ref 11.5–15.5)
WBC: 3 10*3/uL — ABNORMAL LOW (ref 4.0–10.5)

## 2014-03-02 LAB — FERRITIN: FERRITIN: 44 ng/mL (ref 22–322)

## 2014-03-02 NOTE — Progress Notes (Signed)
Labs drawn today for cbc/diff,ferr 

## 2014-03-03 ENCOUNTER — Other Ambulatory Visit (HOSPITAL_COMMUNITY): Payer: Medicaid Other

## 2014-03-04 ENCOUNTER — Other Ambulatory Visit (HOSPITAL_COMMUNITY): Payer: Self-pay | Admitting: Oncology

## 2014-03-04 DIAGNOSIS — D509 Iron deficiency anemia, unspecified: Secondary | ICD-10-CM

## 2014-03-06 NOTE — Telephone Encounter (Signed)
Tried to call pt's home number and it has been disconnected. Tried to call cell number listed and it is his brother's phone number, asked his brother to have pt call me back.

## 2014-03-06 NOTE — Telephone Encounter (Signed)
Pt is aware. He will call back when he is ready for referral to neurology.

## 2014-03-07 ENCOUNTER — Other Ambulatory Visit (HOSPITAL_COMMUNITY): Payer: Self-pay | Admitting: Oncology

## 2014-03-07 DIAGNOSIS — G4700 Insomnia, unspecified: Secondary | ICD-10-CM

## 2014-03-07 MED ORDER — ZOLPIDEM TARTRATE 5 MG PO TABS: 5.0000 mg | ORAL_TABLET | Freq: Every evening | ORAL | Status: DC | PRN

## 2014-03-08 ENCOUNTER — Encounter (HOSPITAL_BASED_OUTPATIENT_CLINIC_OR_DEPARTMENT_OTHER): Payer: Medicaid Other

## 2014-03-08 VITALS — BP 130/82 | HR 79 | Temp 97.6°F | Resp 20

## 2014-03-08 DIAGNOSIS — D509 Iron deficiency anemia, unspecified: Secondary | ICD-10-CM

## 2014-03-08 MED ORDER — SODIUM CHLORIDE 0.9 % IJ SOLN: 10.0000 mL | INTRAMUSCULAR | Status: DC | PRN

## 2014-03-08 MED ORDER — SODIUM CHLORIDE 0.9 % IV SOLN: Freq: Once | INTRAVENOUS | Status: DC

## 2014-03-08 MED ORDER — SODIUM CHLORIDE 0.9 % IV SOLN
1020.0000 mg | Freq: Once | INTRAVENOUS | Status: AC
  Administered 2014-03-08: 1020 mg via INTRAVENOUS
  Filled 2014-03-08: qty 34

## 2014-03-08 MED ORDER — ALTEPLASE 2 MG IJ SOLR: 2.0000 mg | Freq: Once | INTRAMUSCULAR | Status: DC | PRN

## 2014-03-14 ENCOUNTER — Ambulatory Visit: Payer: Medicaid Other | Admitting: Internal Medicine

## 2014-03-25 ENCOUNTER — Other Ambulatory Visit: Payer: Self-pay | Admitting: Physician Assistant

## 2014-04-11 ENCOUNTER — Ambulatory Visit (INDEPENDENT_AMBULATORY_CARE_PROVIDER_SITE_OTHER): Payer: Medicaid Other | Admitting: Internal Medicine

## 2014-04-11 ENCOUNTER — Encounter: Payer: Self-pay | Admitting: Internal Medicine

## 2014-04-11 ENCOUNTER — Other Ambulatory Visit: Payer: Self-pay | Admitting: Internal Medicine

## 2014-04-11 ENCOUNTER — Telehealth: Payer: Self-pay | Admitting: *Deleted

## 2014-04-11 ENCOUNTER — Encounter (INDEPENDENT_AMBULATORY_CARE_PROVIDER_SITE_OTHER): Payer: Self-pay

## 2014-04-11 VITALS — BP 120/71 | HR 81 | Temp 97.6°F | Ht 67.0 in | Wt 254.8 lb

## 2014-04-11 DIAGNOSIS — K7460 Unspecified cirrhosis of liver: Secondary | ICD-10-CM

## 2014-04-11 DIAGNOSIS — Z9889 Other specified postprocedural states: Secondary | ICD-10-CM

## 2014-04-11 DIAGNOSIS — K429 Umbilical hernia without obstruction or gangrene: Secondary | ICD-10-CM

## 2014-04-11 DIAGNOSIS — Z95828 Presence of other vascular implants and grafts: Secondary | ICD-10-CM

## 2014-04-11 LAB — BASIC METABOLIC PANEL
BUN: 15 mg/dL (ref 6–23)
CALCIUM: 9.3 mg/dL (ref 8.4–10.5)
CO2: 28 meq/L (ref 19–32)
CREATININE: 1.3 mg/dL (ref 0.50–1.35)
Chloride: 94 mEq/L — ABNORMAL LOW (ref 96–112)
Glucose, Bld: 163 mg/dL — ABNORMAL HIGH (ref 70–99)
Potassium: 3.8 mEq/L (ref 3.5–5.3)
SODIUM: 131 meq/L — AB (ref 135–145)

## 2014-04-11 NOTE — Telephone Encounter (Signed)
Pt called wanting to give Dr. Gala Romney the name of the medications [facility] gave him, 1. Furosemide 40mg  2. stironolactome 50mg . Please call pt if you have any questions [phone].

## 2014-04-11 NOTE — Telephone Encounter (Signed)
So no new diuretics

## 2014-04-11 NOTE — Progress Notes (Signed)
Primary Care Physician:  [name], PA-C Primary Gastroenterologist:  Dr. Gala Romney  Pre-Procedure History & Physical: HPI:  Kyle [name] is a 58 y.o. male here for followup of [name]/cirrhosis, encephalopathy, history of recent incarcerated umbilical hernia. Umbilical hernia reduced by ED physician. Has been wearing an abdominal binder. When the fluid medication added at [facility]. We do not know what this time. He continues on Lasix and Aldactone. Gained 4 pounds since his last visit. Overall stable. A right hand tremor intermittent. Sometimes bilaterally. [facility] recommended neurology evaluation as we did. This has not been done as of yet. He has 3-4 loose bowel movements daily. No obvious encephalopathy otherwise. No tips check recently. No electrolytes recently. Past Medical History  Diagnosis Date  . DM (diabetes mellitus)   . Cirrhosis     bx proven steatohepatitis with cirrhosis (2010); per note from Feb 2011, received Hep A and B vaccines in 2010.  [name] HTN (hypertension)   . RLS (restless legs syndrome)   . Hyperlipidemia   . IDA (iron deficiency anemia)   . GERD (gastroesophageal reflux disease)   . Depression   . Peripheral neuropathy   . Urothelial cancer     2010, paillary low-grade, h/o recurrence 2011  . B12 deficiency   . Psoriasis   . 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 Anemia due to multiple mechanisms 05/13/2011  . History of alcohol abuse 05/13/2011  . ANEMIA-IRON DEFICIENCY 03/09/2009  . S/P endoscopy May 2012    4 columns grade II esophageal varices; due for repeat in Nov 2013   . S/P colonoscopy May 2012    Tubular adenoma  . Low back pain   . Cancer     bladder ca 05/2009 removal and  w/chemo wash  . Esophageal varices with bleeding(456.0) 11/24/11    s/p emergent EGD 11/25/11 by Dr. Hilarie Fredrickson at [facility], Grade III esophageal varices s/p banding X 5  . Small bowel obstruction 02/15/2012    Admitted to [facility], managed by Dr. Geroge Baseman, ventral hernia manually  reduced  . Ventral hernia 02/15/12  . MRSA (methicillin resistant Staphylococcus aureus)   . Acute post-ligation esophageal ulcer with hemorrhage 04/11/2012  . Clostridium difficile infection   . H. pylori infection 12/24/2012  . Sleep apnea     [facility] took him off CPAP when had tips procedure  . Sleep apnea     Past Surgical History  Procedure Laterality Date  . Carpel tunnel Right   . Shoulder surgery Left   . Adrenal mass surgery  05/2009    benign, left  . Bladder surgery  01/2009 and 06/2010    cancer 2010, small recurrence in 06/2010  . Colonoscopy  04/2009    moderate int hemorrhoids, rare sigmoid diverticula, one mm sessile hyperplastic rectal polyp  . Esophagogastroduodenoscopy  03/2009    small hh  . Small bowel capsule endoscopy  03/2009    couple of small benign appearing erosions, nonbleeding  . Egd/tcs  08/2007    small hiatal hernia, pancolonic diverticula, friable anal canal, 3cm salmon colored epithelium in distal esophagus, bx negative for Barrett's  . Skin cancer excision  Oct 2012    left arm  . Esophagogastroduodenoscopy  11/25/2011    Dr. Phineas Douglas III varices in the mid and distal esophagus, banding placed, portal astropathy  . Esophagogastroduodenoscopy  01/14/2012    Dr Dilon Lank->4-5 columns Gr2 varices, 6 bands placed, HH, distal esophageal ulcer, portal gastropathy, antral erosions  . Esophagogastroduodenoscopy  03/17/2012  Dr. Larina Bras varices status post band ligation. Hiatal hernia. Portal gastropathy.  . Umbilical hernia repair  04/05/2012    Procedure: HERNIA REPAIR UMBILICAL ADULT;  Surgeon: Donato Heinz, MD;  Location: [facility] ORS;  Service: General;  Laterality: N/A;  . Esophagogastroduodenoscopy  04/11/2012    Dr. Bridgett Larsson ulcer possibly at previous banding sites with stigmata     of bleeding.  Attempt at hemostasis with hemoclip and banding was not successful resulting in recurrence of bleed/  Portal gastropathy, but no evidence of gastric  varices/ Recurrent esophageal varices grade 2.  . Esophagogastroduodenoscopy  05/24/2012    Dr. Larina Bras varices, portal gastropathy  . Esophagogastroduodenoscopy  04/11/12    Dr. Drue Novel in the distal esophagus adherent lot and visible vessel treated with bicap. grade I varices in the distal esophagus, one ligating band placed, portal hypertensive gastropathy in the body of the stomach.  . Colonoscopy  09/29/2012    FHL:[id number] varices. Scattered pancolonic diverticula/single cecal polyp-removed as described above  . Esophagogastroduodenoscopy  12/24/2012    Dr. Lynwood Dawley significant upper GI bleed secondary to bleeding esophageal varices. s/p hemostasis therapy with injection of a total of 55mL of 5% ethanolamine and application of 9 bands, complete egd not carried out.  . Schlerotherapy  12/24/2012    Procedure: Woodward Ku OF VARICES;  Surgeon: Daneil Dolin, MD;  Location: [facility] SU[facility];  Service: Endoscopy;  Laterality: N/A;  . Esophageal banding  12/24/2012    [id number] significant upper GI bleed secondary to bleeding esophageal varices  . Tips procedure  12/2012    [facility]  . Cataract extraction, bilateral      Prior to Admission medications   Medication Sig Start Date End Date Taking? Authorizing Provider  b complex vitamins tablet Take 1 tablet by mouth daily.   Yes Historical Provider, MD  carvedilol (COREG) 6.25 MG tablet TAKE ONE TABLET BY MOUTH TWICE DAILY WITH A MEAL   Yes Mary B Dixon, PA-C  furosemide (LASIX) 40 MG tablet Take 40 mg by mouth. 03/03/14  Yes Historical Provider, MD  gabapentin (NEURONTIN) 300 MG capsule TAKE ONE CAPSULE BY MOUTH IN THE MORNING THEN ONE AT NOON THEN THREE AT BEDTIME 02/20/14  Yes Orlena Sheldon, PA-C  insulin regular human CONCENTRATED (HUMULIN R) 500 UNIT/ML SOLN injection Inject 35 Units into the skin 3 (three) times daily with meals.    Yes Historical Provider, MD  lactulose (CHRONULAC) 10 GM/15ML solution Take 3.33 g  by mouth daily.  03/23/13  Yes Mahala Menghini, PA-C  omeprazole (PRILOSEC) 40 MG capsule Take 1 capsule (40 mg total) by mouth daily. 12/22/13  Yes Orvil Feil, NP  rifaximin (XIFAXAN) 550 MG TABS tablet Take 1 tablet (550 mg total) by mouth 2 (two) times daily. 08/01/13  Yes Mahala Menghini, PA-C  spironolactone (ALDACTONE) 50 MG tablet Take 50 mg by mouth. 03/03/14 03/03/15 Yes Historical Provider, MD  zolpidem (AMBIEN) 5 MG tablet Take 1-2 tablets (5-10 mg total) by mouth at bedtime as needed for sleep. 03/07/14  Yes Baird Cancer, PA-C    Allergies as of 04/11/2014 - Review Complete 04/11/2014  Allergen Reaction Noted  . Aspirin Other (See Comments) 12/23/2012  . Ibuprofen Other (See Comments) 05/13/2012  . Tylenol [acetaminophen] Other (See Comments) 07/17/2011    Family History  Problem Relation Age of Onset  . Cirrhosis Father     etoh  . Colon cancer Neg Hx   . Anesthesia problems Neg Hx   . Hypotension Neg Hx   .  Malignant hyperthermia Neg Hx   . Pseudochol deficiency Neg Hx   . Kidney cancer Mother   . Cancer Mother   . HIV Brother   . Cirrhosis Brother     nash    History   Social History  . Marital Status: Married    Spouse Name: N/A    Number of Children: 3  . Years of Education: N/A   Occupational History  . disabled    Social History Main Topics  . Smoking status: Current Every Day Smoker -- 1.50 packs/day for 30 years    Types: Cigarettes  . Smokeless tobacco: Never Used  . Alcohol Use: No     Comment: drank heavily for few years in 20s  . Drug Use: No  . Sexual Activity: Yes    Birth Control/ Protection: None   Other Topics Concern  . Not on file   Social History Narrative  . No narrative on file    Review of Systems: See HPI, otherwise negative ROS  Physical Exam: BP 120/71  Pulse 81  Temp(Src) 97.6 F (36.4 C) (Oral)  Ht 5\' 7"  (1.702 m)  Wt 254 lb 12.8 oz (115.577 kg)  BMI 39.90 kg/m2 General:   Alert,  pleasant, jovial at his  baseline. Alert conversant in no acute distress. Well oriented  No hand tremor noted. No asterixis today  Eyes:  Sclera clear, no icterus.   Conjunctiva pink. Ears:  Normal auditory acuity. Nose:  No deformity, discharge,  or lesions. Mouth:  No deformity or lesions. Neck:  Supple; no masses or thyromegaly. No significant cervical adenopathy. Lungs:  Clear throughout to auscultation.   No wheezes, crackles, or rhonchi. No acute distress. Heart:  Regular rate and rhythm; no murmurs, clicks, rubs,  or gallops. Abdomen: Significantly obese. Umbilical hernia not apparent positive bowel sounds soft liver edge below the right costal margin. No obvious shifting dullness or fluid wave. Pulses:  Normal pulses noted. Extremities:  Without clubbing or edema.  Impression:   Kyle [name] cirrhosis. Status post TIPS. Recent incarcerated umbilical hernia.  Recommendations:   BMET today  Doppler study abdomen - check TIPS patency  Continue present medical regimen  Continue using abdominal binder  Call with new medication started at [place] visit 6 - 8 weeks  See primary care physician or dermatologist for any concerning skin lesions  Should see the neurologist at some point if tremor is persistent or recurrent.        Notice: This dictation was prepared with Dragon dictation along with smaller phrase technology. Any transcriptional errors that result from this process are unintentional and may not be corrected upon review.

## 2014-04-11 NOTE — Telephone Encounter (Signed)
Routing to RMR for FYI- these medications: lasix 40 and spironolactone 50 are already on pts medication list.

## 2014-04-11 NOTE — Patient Instructions (Addendum)
BMET today  Doppler study abdomen - check TIPS patency  Continue present medical regimen  Continue using abdominal binder  Call with new medication started at [place] visit 6 - 8 weeks

## 2014-04-14 ENCOUNTER — Ambulatory Visit (HOSPITAL_COMMUNITY)
Admission: RE | Admit: 2014-04-14 | Discharge: 2014-04-14 | Disposition: A | Discharge status: Home / Self Care | Payer: Medicaid Other | Source: Ambulatory Visit | Attending: Internal Medicine | Admitting: Internal Medicine

## 2014-04-14 DIAGNOSIS — K7460 Unspecified cirrhosis of liver: Secondary | ICD-10-CM | POA: Diagnosis present

## 2014-04-17 IMAGING — CT CT HEAD W/O CM
1 of 2 series · 16 of 30 positions shown, 20 images · non-contrast
Comparison: 07/20/2008

CLINICAL DATA: Altered mental status, combative, history diabetes,
hypertension, bladder cancer

CT HEAD WITHOUT CONTRAST
TECHNIQUE: Contiguous axial images were obtained from the base of
the skull through the vertex without contrast.

[Series 3: headtrauma 2.4 h60s · axial · 0.43mm/px · z∈[+132,+292]mm · 16 of 72 slices shown, 20 images]
[im 4/72  brain]
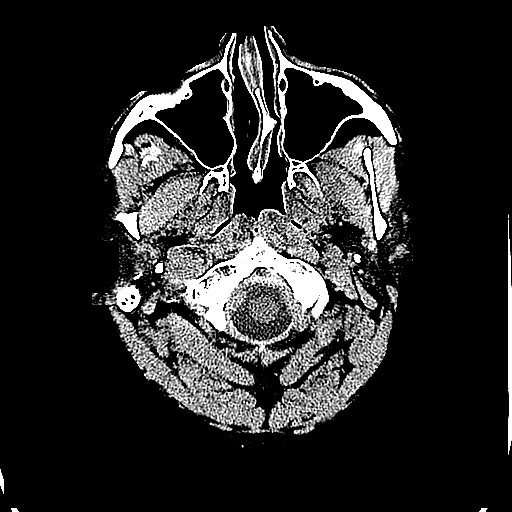
[im 4/72  bone]
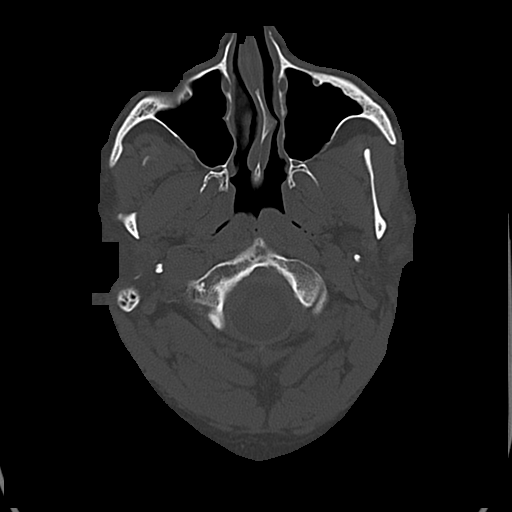
[im 8/72  brain]
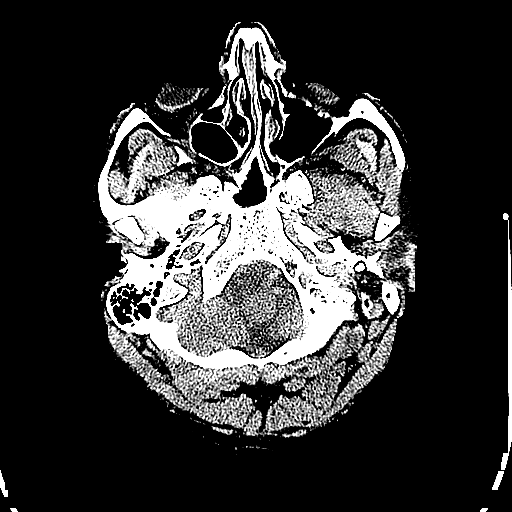
[im 12/72  brain]
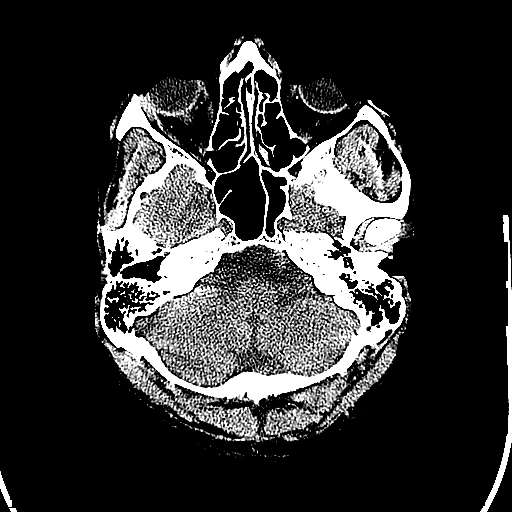
[im 15/72  brain]
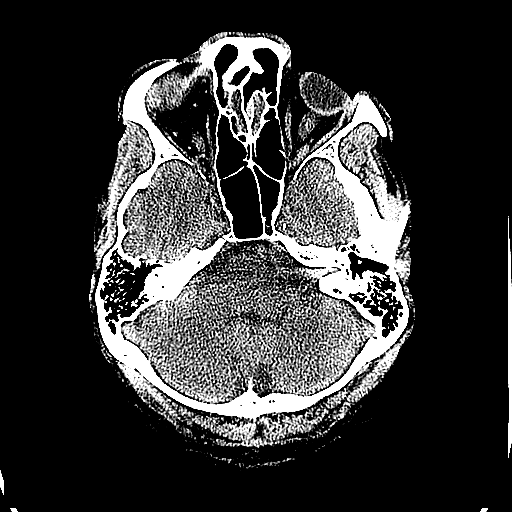
[im 23/72  brain]
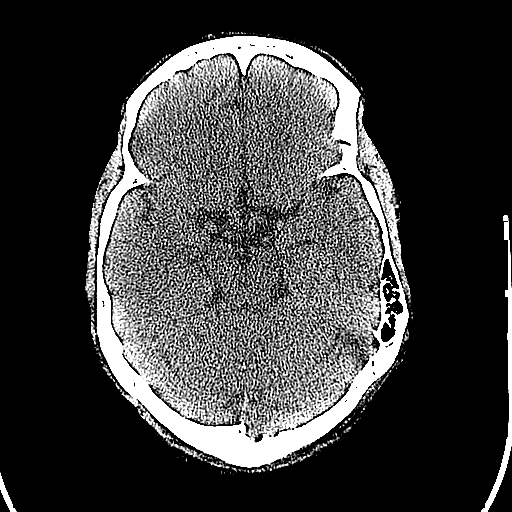
[im 23/72  bone]
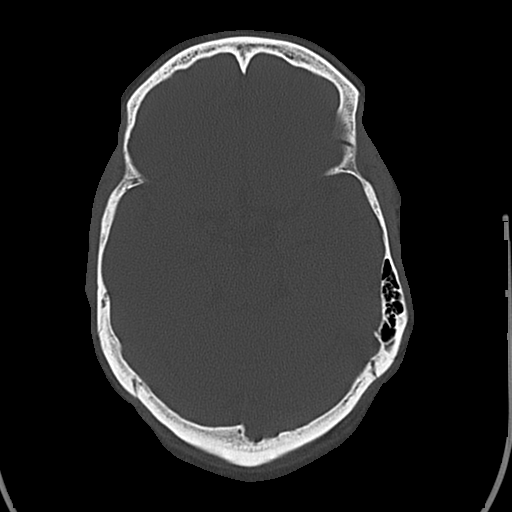
[im 27/72  brain]
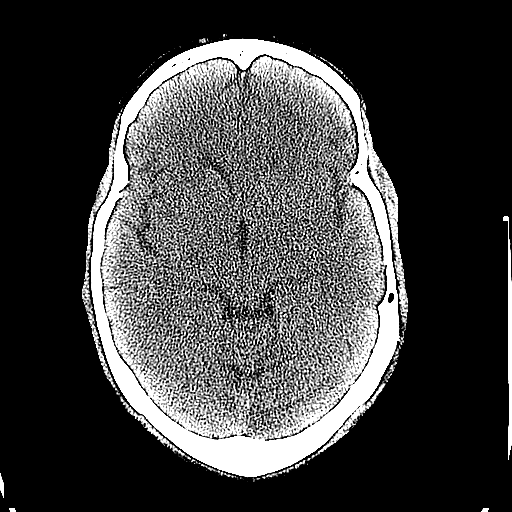
[im 30/72  brain]
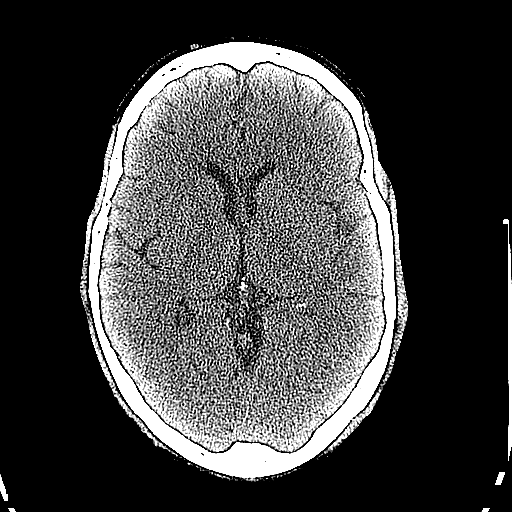
[im 34/72  brain]
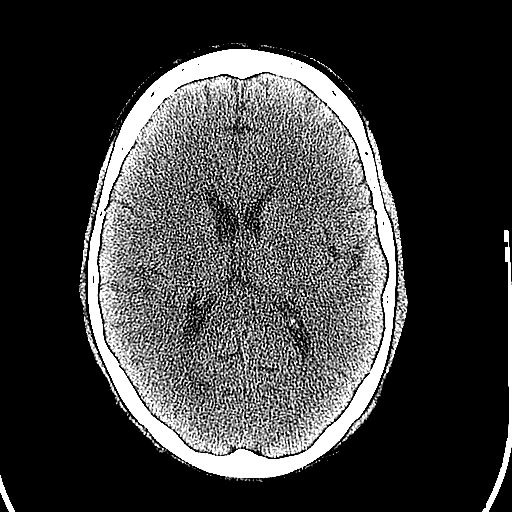
[im 38/72  brain]
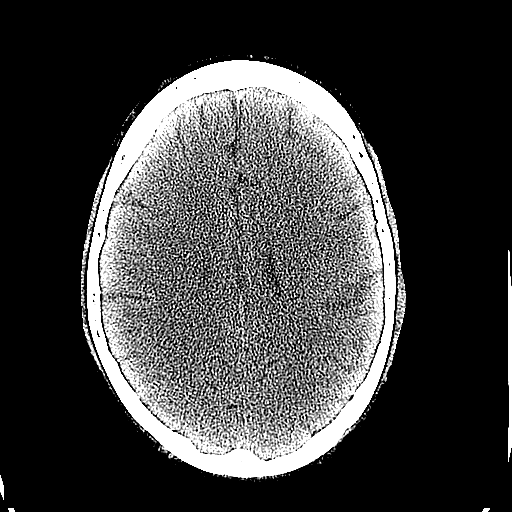
[im 38/72  bone]
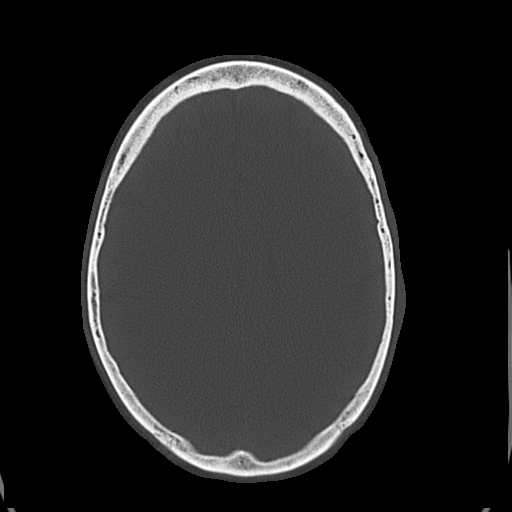
[im 42/72  brain]
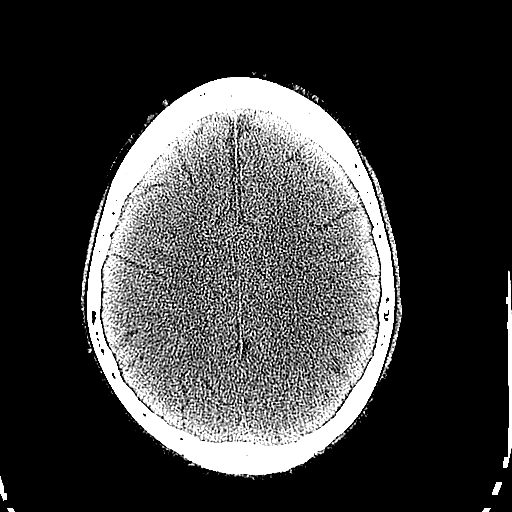
[im 45/72  brain]
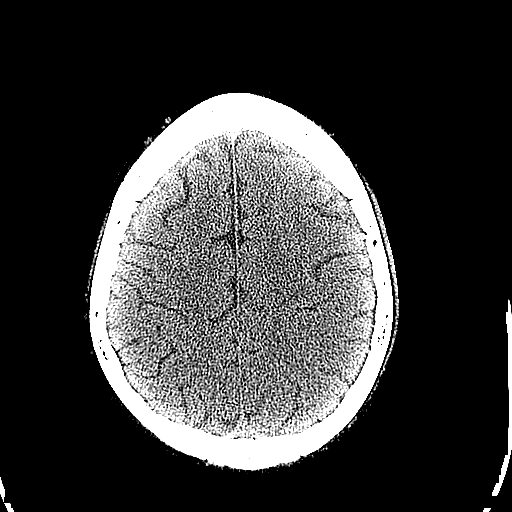
[im 49/72  brain]
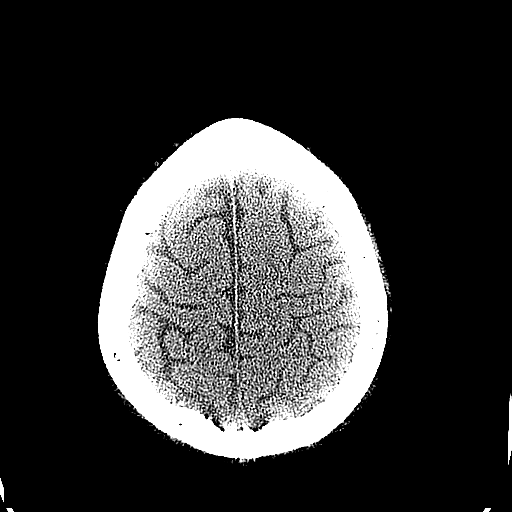
[im 57/72  brain]
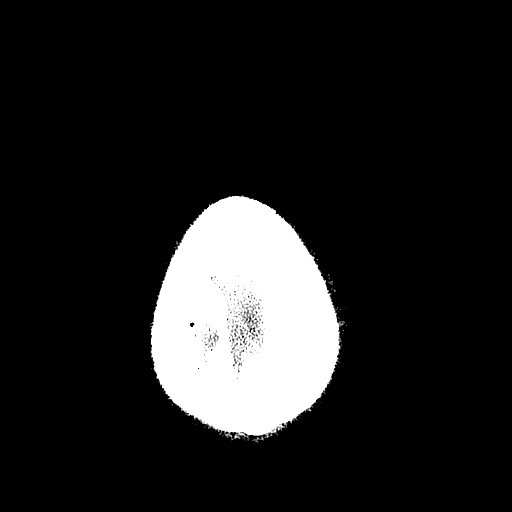
[im 57/72  bone]
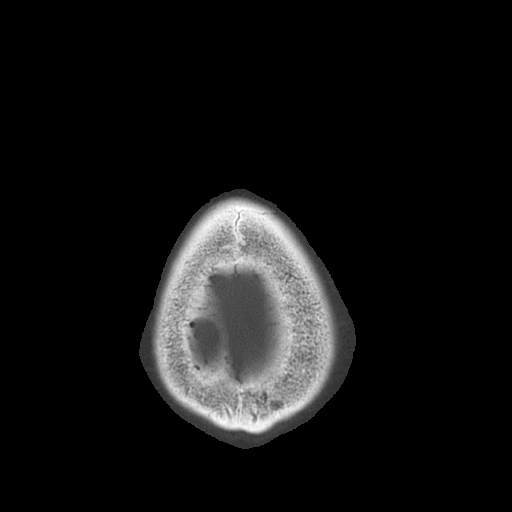
[im 60/72  brain]
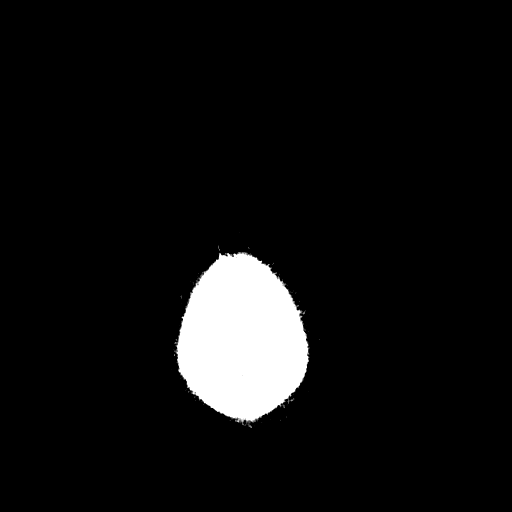
[im 64/72  brain]
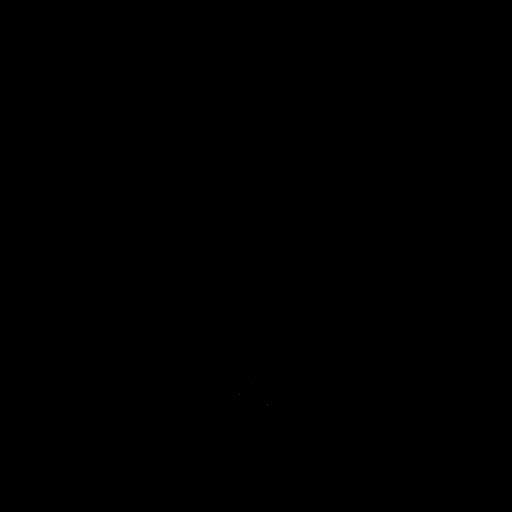
[im 68/72  brain]
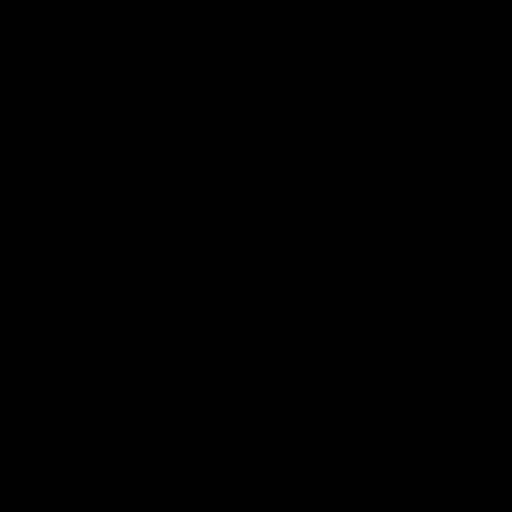

[16 of 30 positions shown; findings below may reference images not displayed]

FINDINGS: Normal ventricular morphology.
No midline shift or mass effect.
Normal appearance of brain parenchyma.
No intracranial hemorrhage, mass lesion or evidence of acute
infarction.
No extra-axial fluid collections.
Bones and sinuses unremarkable.
IMPRESSION: No acute intracranial abnormalities.

## 2014-04-18 ENCOUNTER — Other Ambulatory Visit: Payer: Self-pay | Admitting: Physician Assistant

## 2014-04-19 NOTE — Telephone Encounter (Signed)
Medication filled x1 with no refills.   Requires office visit before any further refills can be given.  

## 2014-04-20 ENCOUNTER — Telehealth: Payer: Self-pay

## 2014-04-20 NOTE — Telephone Encounter (Signed)
Yes. I have reviewed his chart. It is fine to change Coreg to either of these options.

## 2014-04-20 NOTE — Telephone Encounter (Signed)
Let's start propranolol 20 mg twice daily; dispense 60 with 11 refills

## 2014-04-20 NOTE — Telephone Encounter (Signed)
Thank you :)

## 2014-04-20 NOTE — Telephone Encounter (Signed)
Tried to call pt- LMOM 

## 2014-04-20 NOTE — Telephone Encounter (Signed)
Ms Kyle [name], Dr. Gala Romney would like to know if Mr. Kyle [name] can be switched from Coreg to nadolol or propranolol? Please advise.   Thank you!

## 2014-04-20 NOTE — Telephone Encounter (Signed)
Message copied by Claudina Lick on Thu Apr 20, 2014 10:24 AM ------      Message from: Daneil Dolin      Created: Wed Apr 19, 2014  9:41 AM       Let's talk with whoever put patient on Coreg; needs to be a nonspecific beta blocker such as naldolol or propranolol. Can he be switched?      ----- Message -----         From: Rosanne Sack, CMA         Sent: 04/18/2014   2:35 PM           To: Daneil Dolin, MD            He states that he takes Aldactone 50 mg once daily and Lasix 40 mg once daily. Coreg 6.25 mg BID.                  Thanks      Ginger      ----- Message -----         From: Daneil Dolin, MD         Sent: 04/16/2014   4:48 PM           To: Mahala Menghini, PA-C, Marylou Mccoy, LPN            Med list from his recent office visit does not state the number of times daily he is supposed to be taking aldactone and lasix; also, what happened to his non-selective beta blocker??  I can't seem to find what I'm looking for.             ------

## 2014-04-21 NOTE — Telephone Encounter (Signed)
Tried to call pt- LMOM 

## 2014-04-24 DIAGNOSIS — L0390 Cellulitis, unspecified: Secondary | ICD-10-CM

## 2014-04-24 HISTORY — DX: Cellulitis, unspecified: L03.90

## 2014-04-25 NOTE — Telephone Encounter (Signed)
Tried to call pt- LMOM 

## 2014-04-26 NOTE — Telephone Encounter (Signed)
Tried to call pt- LMOM 

## 2014-04-26 NOTE — Telephone Encounter (Signed)
Letter mailed to pt asking him to call the office

## 2014-04-27 MED ORDER — PROPRANOLOL HCL 20 MG PO TABS: 20.0000 mg | ORAL_TABLET | Freq: Two times a day (BID) | ORAL | Status: DC

## 2014-04-27 NOTE — Addendum Note (Signed)
Addended by: Claudina Lick on: 04/27/2014 08:19 AM   Modules accepted: Orders

## 2014-04-27 NOTE — Telephone Encounter (Signed)
Pt is aware. rx sent to the pharmacy. 

## 2014-05-01 ENCOUNTER — Telehealth: Payer: Self-pay | Admitting: Gastroenterology

## 2014-05-01 ENCOUNTER — Telehealth: Payer: Self-pay | Admitting: Internal Medicine

## 2014-05-01 NOTE — Telephone Encounter (Signed)
Referral has been made to [facility] for formal transplant evaluation

## 2014-05-01 NOTE — Telephone Encounter (Signed)
Opened in error

## 2014-05-01 NOTE — Telephone Encounter (Signed)
Message copied by Rodney Langton on Mon May 01, 2014 11:40 AM ------      Message from: Zeb Comfort D      Created: Mon May 01, 2014 10:58 AM                   ----- Message -----         From: Daneil Dolin, MD         Sent: 05/01/2014   8:35 AM           To: Jannett Celestine, PA-C            Noted. Let's make sure he has an appointment for a formal transplant evaluation at [facility] in the near future.      ----- Message -----         From: Mahala Menghini, PA-C         Sent: 05/01/2014   8:19 AM           To: Daneil Dolin, MD            He was not on lasix or aldactone when I saw him last in 01/2014 but looks like lasix 40mg  daily and aldactone 50mg  daily added by [facility] in 02/2014.              He has not been on non-selective bb for at least looking back to when he had TIPS placed 12/2012. Prior to that he was on nadolol and after that he has been on coreg. I can't find records of whether [facility] changed him but their current notes document same meds we have.       ----- Message -----         From: Daneil Dolin, MD         Sent: 04/16/2014   4:48 PM           To: Mahala Menghini, PA-C, Marylou Mccoy, LPN            Med list from his recent office visit does not state the number of times daily he is supposed to be taking aldactone and lasix; also, what happened to his non-selective beta blocker??  I can't seem to find what I'm looking for.             ------

## 2014-05-15 ENCOUNTER — Telehealth: Payer: Self-pay | Admitting: Internal Medicine

## 2014-05-15 ENCOUNTER — Encounter: Payer: Self-pay | Admitting: Physician Assistant

## 2014-05-15 ENCOUNTER — Ambulatory Visit (INDEPENDENT_AMBULATORY_CARE_PROVIDER_SITE_OTHER): Payer: Medicaid Other | Admitting: Physician Assistant

## 2014-05-15 VITALS — BP 104/60 | HR 80 | Temp 98.2°F | Resp 18 | Wt 252.0 lb

## 2014-05-15 DIAGNOSIS — L02419 Cutaneous abscess of limb, unspecified: Secondary | ICD-10-CM

## 2014-05-15 DIAGNOSIS — L03119 Cellulitis of unspecified part of limb: Principal | ICD-10-CM

## 2014-05-15 MED ORDER — DOXYCYCLINE HYCLATE 100 MG PO TABS: 100.0000 mg | ORAL_TABLET | Freq: Two times a day (BID) | ORAL | Status: DC

## 2014-05-15 MED ORDER — SULFAMETHOXAZOLE-TMP DS 800-160 MG PO TABS: 1.0000 | ORAL_TABLET | Freq: Two times a day (BID) | ORAL | Status: DC

## 2014-05-15 NOTE — Telephone Encounter (Signed)
Pt called to verify his OV with RMR for this Friday at 8 and to also let us know that he seen his PCP today and is being treated for staph infection in his right leg and was put on 2 antibiotics.

## 2014-05-15 NOTE — Telephone Encounter (Signed)
Patient is scheduled at [facility] Sept 25th @ 11

## 2014-05-15 NOTE — Progress Notes (Signed)
Patient ID: ESIQUIO BOESEN MRN: [id number], DOB: 22-Mar-1956, 58 y.o. Date of Encounter: 05/15/2014, 1:58 PM    Chief Complaint:  Chief Complaint  Patient presents with  . c/o staph infection    back rt thigh     HPI: 58 y.o. year old white male first noticed this area beginning to develop on his right lateral thigh 3 days ago. He has been cleaning the area with alcohol and applying Neosporin Says the site has been draining. [name] He has had one similar type of skin infection like this one in the past and was seen here for that a little over a year ago he thinks. Says that one time is the only prior episode that he can recall having something like this. Has had no fevers and no chills.     Home Meds:   Outpatient Prescriptions Prior to Visit  Medication Sig Dispense Refill  . b complex vitamins tablet Take 1 tablet by mouth daily.      . carvedilol (COREG) 6.25 MG tablet TAKE ONE TABLET BY MOUTH TWICE DAILY WITH A MEAL  60 tablet  0  . furosemide (LASIX) 40 MG tablet Take 40 mg by mouth.      . insulin regular human CONCENTRATED (HUMULIN R) 500 UNIT/ML SOLN injection Inject 35 Units into the skin 3 (three) times daily with meals.       . lactulose (CHRONULAC) 10 GM/15ML solution Take 3.33 g by mouth daily.       [name] omeprazole (PRILOSEC) 40 MG capsule Take 1 capsule (40 mg total) by mouth daily.  30 capsule  11  . propranolol (INDERAL) 20 MG tablet Take 1 tablet (20 mg total) by mouth 2 (two) times daily.  60 tablet  11  . rifaximin (XIFAXAN) 550 MG TABS tablet Take 1 tablet (550 mg total) by mouth 2 (two) times daily.  60 tablet  11  . spironolactone (ALDACTONE) 50 MG tablet Take 50 mg by mouth.      . zolpidem (AMBIEN) 5 MG tablet Take 1-2 tablets (5-10 mg total) by mouth at bedtime as needed for sleep.  60 tablet  2  . gabapentin (NEURONTIN) 300 MG capsule TAKE ONE CAPSULE BY MOUTH IN THE MORNING, ONE CAPSULE AT NOON, AND THREE CAPSULES AT BEDTIME  150 capsule  0   No  facility-administered medications prior to visit.    Allergies:  Allergies  Allergen Reactions  . Aspirin Other (See Comments)    Harms liver.   . Ibuprofen Other (See Comments)    Can't take per Dr Gala Romney- harms livers  . Tylenol [Acetaminophen] Other (See Comments)    Restless Legs       Review of Systems: See HPI for pertinent ROS. All other ROS negative.    Physical Exam: Blood pressure 104/60, pulse 80, temperature 98.2 F (36.8 C), temperature source Oral, resp. rate 18, weight 252 lb (114.306 kg)., Body mass index is 39.46 kg/(m^2). General:WNWD WM.   Appears in no acute distress. Neck: Supple. No thyromegaly. No lymphadenopathy. Lungs: Clear bilaterally to auscultation without wheezes, rales, or rhonchi. Breathing is unlabored. Heart: Regular rhythm. No murmurs, rubs, or gallops. Msk:  Strength and tone normal for age. Extremities/Skin: Lateral aspect of right thigh: There is an oval shaped area of erythema which measures about 3 inches horizontally by 2 inches vertically.  Inside of this area, at the left side of the erythematous area--  Is an approximate 1-1/2 inch horizontal x  1 inch vertical  area that is open--the top layer of skin is off--this area has been draining. The entire area (3 inch x 2 inch) area is firm. There is no area of fluctuation. Neuro: Alert and oriented X 3. Moves all extremities spontaneously. Gait is normal. CNII-XII grossly in tact. Psych:  Responds to questions appropriately with a normal affect.     ASSESSMENT AND PLAN:  58 y.o. year old male with  1. Cellulitis and abscess of leg I discussed with the patient that this is infection and he must work to prevent spread of infection.  He must keep his hands off of it. If he does have to touch the area then he needs to wash his hands with soap immediately after.  Also discussed trying to keep his clothes from coming in time contact with the site.  I will send culture. He is to start taking both  of these antibiotics as directed. If the site worsens or is not improving in several days then he needs to follow up immediately. - sulfamethoxazole-trimethoprim (BACTRIM DS) 800-160 MG per tablet; Take 1 tablet by mouth 2 (two) times daily.  Dispense: 28 tablet; Refill: 0 - doxycycline (VIBRA-TABS) 100 MG tablet; Take 1 tablet (100 mg total) by mouth 2 (two) times daily.  Dispense: 28 tablet; Refill: 0 - Culture, routine-abscess   Signed, [address] [place], [place], [facility] 05/15/2014 1:58 PM

## 2014-05-16 NOTE — Progress Notes (Signed)
Kyle Juba, PA-C [address] [place] [address]  ANEMIA-IRON DEFICIENCY - Plan: Comprehensive metabolic panel, CBC with Differential, Ferritin, Comprehensive metabolic panel  HEPATIC CIRRHOSIS - Plan: Comprehensive metabolic panel, CBC with Differential, Comprehensive metabolic panel  Thrombocytopenia due to hypersplenism - Plan: Comprehensive metabolic panel, CBC with Differential, Comprehensive metabolic panel  Anemia due to multiple mechanisms - Plan: Comprehensive metabolic panel, Comprehensive metabolic panel  Upper GI bleed - Plan: Comprehensive metabolic panel, Comprehensive metabolic panel  Esophageal varices - Plan: Comprehensive metabolic panel, Comprehensive metabolic panel  ANEMIA, VITAMIN B12 DEFICIENCY - Plan: CBC with Differential  CURRENT THERAPY: Intermittent IV Feraheme PRN lab results.  INTERVAL HISTORY: Kyle [name] 58 y.o. male returns for  regular  visit for followup of anemia of multiple mechanisms.  I personally reviewed and went over laboratory results with the patient.  The results are noted within this dictation.  Chart is reviewed.  GI notes reviewed and noted.   I personally reviewed and went over radiographic studies with the patient.  The results are noted within this dictation.  TIPS remains patent per [place] on 04/18/2014.  He was seen by Dr. Gala [name] today and according to the patient, he is pleased.  Per Dr. Roseanne [name] note: Kyle [name]/cirrhosis - well compensated at this time. Recent cellulitis to improve. He looks to be fairly euvolemic at this time. He's planning to see the transplant folks at [facility] in the near future  Medications reviewed with patient today.  Continue present medical regimen  Office visit in 3 months (BMET just before OV)  [name] reports that he goes back to [facility] hepatology clinic in 1-2 months.  "They are going to run a whole bunch of tests."  His sheppard dog passed away a while back and he now has a new  sheppard puppy named rachet.  We spent some time talking about his new dog.    Hematologically, he denies any complaints and ROS questioning is negative.  Smoking cessation education was provided.    Past Medical History  Diagnosis Date  . DM (diabetes mellitus)   . Cirrhosis     bx proven steatohepatitis with cirrhosis (2010); per note from Feb 2011, received Hep A and B vaccines in 2010.  [name] HTN (hypertension)   . RLS (restless legs syndrome)   . Hyperlipidemia   . IDA (iron deficiency anemia)   . GERD (gastroesophageal reflux disease)   . Depression   . Peripheral neuropathy   . Urothelial cancer     2010, paillary low-grade, h/o recurrence 2011  . B12 deficiency   . Psoriasis   . 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 Anemia due to multiple mechanisms 05/13/2011  . History of alcohol abuse 05/13/2011  . ANEMIA-IRON DEFICIENCY 03/09/2009  . S/P endoscopy May 2012    4 columns grade II esophageal varices; due for repeat in Nov 2013   . S/P colonoscopy May 2012    Tubular adenoma  . Low back pain   . Cancer     bladder ca 05/2009 removal and  w/chemo wash  . Esophageal varices with bleeding(456.0) 11/24/11    s/p emergent EGD 11/25/11 by Dr. Hilarie Fredrickson at [facility], Grade III esophageal varices s/p banding X 5  . Small bowel obstruction 02/15/2012    Admitted to [facility], managed by Dr. Geroge Baseman, ventral hernia manually reduced  . Ventral hernia 02/15/12  . MRSA (methicillin resistant Staphylococcus aureus)   . Acute post-ligation esophageal ulcer with hemorrhage 04/11/2012  .  Clostridium difficile infection   . H. pylori infection 12/24/2012  . Sleep apnea     [facility] took him off CPAP when had tips procedure  . Sleep apnea   . Cellulitis 04/2014    right upper leg    has BLADDER CANCER; DIABETES MELLITUS, TYPE II, CONTROLLED, WITH COMPLICATIONS; ADRENAL MASS; HYPERLIPIDEMIA; ANEMIA-IRON DEFICIENCY; ANEMIA, VITAMIN B12 DEFICIENCY; SMOKER; DEPRESSION; SLEEP APNEA, OBSTRUCTIVE,  MODERATE; RESTLESS LEG SYNDROME; HYPERTENSION; GERD; HEPATIC CIRRHOSIS; Other chronic nonalcoholic liver disease; ARTHRITIS; SHOULDER PAIN, LEFT; INSOMNIA; FATIGUE; WEIGHT GAIN; CHEST DISCOMFORT; PROTEINURIA; ABNORMAL ELECTROCARDIOGRAM; Thrombocytopenia due to hypersplenism; Anemia due to multiple mechanisms; History of alcohol abuse; Abdominal pain; External hemorrhoid; Upper GI bleed; Hypotension; Diarrhea; Esophageal varices; Umbilical hernia; History of small bowel obstruction; Acute post-ligation esophageal ulcer with hemorrhage; Acute posthemorrhagic anemia; Acute respiratory failure; UTI (urinary tract infection); Hyperkalemia; H. pylori infection; Microscopic hematuria; Hemorrhagic shock; Respiratory failure with hypercapnia; Aspiration pneumonia; SIRS (systemic inflammatory response syndrome); Seizure; Fever, unspecified; Chronic kidney disease, stage II (mild); and Periumbilical abdominal pain on his problem list.     is allergic to aspirin; ibuprofen; and tylenol.  [name] does not currently have medications on file.  Past Surgical History  Procedure Laterality Date  . Carpel tunnel Right   . Shoulder surgery Left   . Adrenal mass surgery  05/2009    benign, left  . Bladder surgery  01/2009 and 06/2010    cancer 2010, small recurrence in 06/2010  . Colonoscopy  04/2009    moderate int hemorrhoids, rare sigmoid diverticula, one mm sessile hyperplastic rectal polyp  . Esophagogastroduodenoscopy  03/2009    small hh  . Small bowel capsule endoscopy  03/2009    couple of small benign appearing erosions, nonbleeding  . Egd/tcs  08/2007    small hiatal hernia, pancolonic diverticula, friable anal canal, 3cm salmon colored epithelium in distal esophagus, bx negative for Barrett's  . Skin cancer excision  Oct 2012    left arm  . Esophagogastroduodenoscopy  11/25/2011    Dr. Phineas Douglas III varices in the mid and distal esophagus, banding placed, portal astropathy  . Esophagogastroduodenoscopy   01/14/2012    Dr Rourk->4-5 columns Gr2 varices, 6 bands placed, HH, distal esophageal ulcer, portal gastropathy, antral erosions  . Esophagogastroduodenoscopy  03/17/2012    Dr. Larina Bras varices status post band ligation. Hiatal hernia. Portal gastropathy.  . Umbilical hernia repair  04/05/2012    Procedure: HERNIA REPAIR UMBILICAL ADULT;  Surgeon: Donato Heinz, MD;  Location: [facility];  Service: General;  Laterality: N/A;  . Esophagogastroduodenoscopy  04/11/2012    Dr. Bridgett Larsson ulcer possibly at previous banding sites with stigmata     of bleeding.  Attempt at hemostasis with hemoclip and banding was not successful resulting in recurrence of bleed/  Portal gastropathy, but no evidence of gastric varices/ Recurrent esophageal varices grade 2.  . Esophagogastroduodenoscopy  05/24/2012    Dr. Larina Bras varices, portal gastropathy  . Esophagogastroduodenoscopy  04/11/12    Dr. Drue Novel in the distal esophagus adherent lot and visible vessel treated with bicap. grade I varices in the distal esophagus, one ligating band placed, portal hypertensive gastropathy in the body of the stomach.  . Colonoscopy  09/29/2012    HYQ:MVHQIO varices. Scattered pancolonic diverticula/single cecal polyp-removed as described above  . Esophagogastroduodenoscopy  12/24/2012    Dr. Lynwood Dawley significant upper GI bleed secondary to bleeding esophageal varices. s/p hemostasis therapy with injection of a total of 16mL of 5% ethanolamine and application of 9 bands, complete egd  not carried out.  . Schlerotherapy  12/24/2012    Procedure: Woodward Ku OF VARICES;  Surgeon: Daneil Dolin, MD;  Location: [facility] SU[facility];  Service: Endoscopy;  Laterality: N/A;  . Esophageal banding  12/24/2012    [facility]:[id number] significant upper GI bleed secondary to bleeding esophageal varices  . Tips procedure  12/2012    [facility]  . Cataract extraction, bilateral      Denies any headaches, dizziness,  double vision, fevers, chills, night sweats, nausea, vomiting, diarrhea, constipation, chest pain, heart palpitations, shortness of breath, blood in stool, black tarry stool, urinary pain, urinary burning, urinary frequency, hematuria.   PHYSICAL EXAMINATION  ECOG PERFORMANCE STATUS: 1 - Symptomatic but completely ambulatory  Filed Vitals:   05/19/14 1000  BP: 96/61  Pulse: 79  Temp: 98.4 F (36.9 C)  Resp: 18    GENERAL:alert, no distress, well nourished, well developed, comfortable, cooperative, obese, smiling and chronically ill appearing SKIN: skin color, texture, turgor are normal, no rashes or significant lesions HEAD: Normocephalic, No masses, lesions, tenderness or abnormalities EYES: normal, PERRLA, EOMI, Conjunctiva are pink and non-injected EARS: External ears normal OROPHARYNX:mucous membranes are moist  NECK: supple, trachea midline LYMPH:  no palpable lymphadenopathy BREAST:not examined LUNGS: clear to auscultation  HEART: regular rate & rhythm, no murmurs and no gallops ABDOMEN:abdomen soft and obese BACK: Back symmetric, no curvature. EXTREMITIES:less then 2 second capillary refill, no joint deformities, effusion, or inflammation, no edema, no skin discoloration, no clubbing, no cyanosis  NEURO: alert & oriented x 3 with fluent speech, no focal motor/sensory deficits, gait normal   LABORATORY DATA: CBC    Component Value Date/Time   WBC 3.0* 03/02/2014 1021   RBC 4.37 03/02/2014 1021   RBC 3.97* 12/18/2010 1235   HGB 13.6 03/02/2014 1021   HCT 40.1 03/02/2014 1021   PLT 52* 03/02/2014 1021   MCV 91.8 03/02/2014 1021   MCH 31.1 03/02/2014 1021   MCHC 33.9 03/02/2014 1021   RDW 14.2 03/02/2014 1021   LYMPHSABS 0.9 03/02/2014 1021   MONOABS 0.3 03/02/2014 1021   EOSABS 0.0 03/02/2014 1021   BASOSABS 0.0 03/02/2014 1021    Lab Results  Component Value Date   FERRITIN 44 03/02/2014      PENDING LABS: CBC diff, Ferritin, CMET    ASSESSMENT:  1. Multifactorial anemia, last  Feraheme 1020 IV infusion was on 03/24/2013.  2. Iron deficiency anemia requiring IV Feraheme infusions  3. Liver disease with fatty infiltration.  4. Hospitalization for seizure-like activity due to hyperglycemia in March 2014.  5. Recent Hospitalization from severe upper esophageal bleeding requiring multiple PRBC transfusions, intubation, and transfer to [facility] for TIPS procedure which was performed on 01/17/2013. He was in ICU at [facility] for over 20 days.  6. Obesity  7. Probably lung disease secondary to longstanding smoking history.  8. DM  9. Esophageal varices  10. RLS, failed Tramadol and Gabapentin.  11. Insomnia, on ambien and much improved.    Patient Active Problem List   Diagnosis Date Noted  . Periumbilical abdominal pain 01/25/2014  . Chronic kidney disease, stage II (mild) 12/13/2013  . Fever, unspecified 02/05/2013  . Seizure 02/03/2013  . Aspiration pneumonia 12/25/2012  . SIRS (systemic inflammatory response syndrome) 12/25/2012  . UTI (urinary tract infection) 12/24/2012  . Hyperkalemia 12/24/2012  . H. pylori infection 12/24/2012  . Microscopic hematuria 12/24/2012  . Hemorrhagic shock 12/24/2012  . Respiratory failure with hypercapnia 12/24/2012  . Acute post-ligation esophageal ulcer with hemorrhage 04/11/2012  .  Acute posthemorrhagic anemia 04/11/2012  . Acute respiratory failure 04/11/2012  . Umbilical hernia 20/60/1561  . History of small bowel obstruction 03/03/2012  . Diarrhea 12/19/2011  . Esophageal varices 12/19/2011  . Upper GI bleed 11/25/2011  . Hypotension 11/25/2011  . Abdominal pain 11/04/2011  . External hemorrhoid 11/04/2011  . 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 Anemia due to multiple mechanisms 05/13/2011  . History of alcohol abuse 05/13/2011  . Other chronic nonalcoholic liver disease 53/79/4327  . DIABETES MELLITUS, TYPE II, CONTROLLED, WITH COMPLICATIONS 61/47/0929  . BLADDER CANCER 05/21/2009  . HEPATIC CIRRHOSIS  05/21/2009  . SHOULDER PAIN, LEFT 04/19/2009  . SMOKER 04/04/2009  . GERD 04/04/2009  . ARTHRITIS 04/04/2009  . ANEMIA-IRON DEFICIENCY 03/09/2009  . ANEMIA, VITAMIN B12 DEFICIENCY 03/09/2009  . CHEST DISCOMFORT 02/28/2009  . ADRENAL MASS 12/27/2008  . INSOMNIA 12/27/2008  . RESTLESS LEG SYNDROME 12/13/2008  . SLEEP APNEA, OBSTRUCTIVE, MODERATE 10/25/2008  . PROTEINURIA 10/06/2008  . HYPERLIPIDEMIA 10/04/2008  . DEPRESSION 10/04/2008  . HYPERTENSION 10/04/2008  . FATIGUE 10/04/2008  . WEIGHT GAIN 10/04/2008  . ABNORMAL ELECTROCARDIOGRAM 10/04/2008     PLAN:  1. I personally reviewed and went over laboratory results with the patient.  The results are noted within this dictation. 2. I personally reviewed and went over radiographic studies with the patient.  The results are noted within this dictation.   3. Chart is reviewed 4. Labs every 6 weeks: CBC diff, ferritin  5. Labs today: CBC diff, CMET, Ferritin 6. Return in 4-6 months   THERAPY PLAN:  From a Hematologic standpoint, we will continue to support his blood counts with supportive interventions. We will monitor iron stores and provide IV Feraheme when treatment criteria are met.   All questions were answered. The patient knows to call the clinic with any problems, questions or concerns. We can certainly see the patient much sooner if necessary.  Patient and plan discussed with Dr. Farrel Gobble and he is in agreement with the aforementioned.   KEFALAS,THOMAS 05/19/2014

## 2014-05-18 LAB — CULTURE, ROUTINE-ABSCESS
GRAM STAIN: NONE SEEN
Gram Stain: NONE SEEN

## 2014-05-19 ENCOUNTER — Ambulatory Visit (INDEPENDENT_AMBULATORY_CARE_PROVIDER_SITE_OTHER): Payer: Medicaid Other | Admitting: Internal Medicine

## 2014-05-19 ENCOUNTER — Encounter: Payer: Self-pay | Admitting: Internal Medicine

## 2014-05-19 ENCOUNTER — Encounter (HOSPITAL_COMMUNITY): Payer: Medicaid Other

## 2014-05-19 ENCOUNTER — Ambulatory Visit: Payer: Medicaid Other | Admitting: Internal Medicine

## 2014-05-19 ENCOUNTER — Encounter (HOSPITAL_COMMUNITY): Payer: Medicaid Other | Attending: Oncology | Admitting: Oncology

## 2014-05-19 ENCOUNTER — Encounter (HOSPITAL_COMMUNITY): Payer: Self-pay | Admitting: Oncology

## 2014-05-19 ENCOUNTER — Encounter (INDEPENDENT_AMBULATORY_CARE_PROVIDER_SITE_OTHER): Payer: Self-pay

## 2014-05-19 VITALS — BP 111/70 | HR 78 | Temp 97.6°F | Ht 67.0 in | Wt 254.8 lb

## 2014-05-19 VITALS — BP 96/61 | HR 79 | Temp 98.4°F | Resp 18 | Wt 254.9 lb

## 2014-05-19 DIAGNOSIS — K7460 Unspecified cirrhosis of liver: Secondary | ICD-10-CM

## 2014-05-19 DIAGNOSIS — I8500 Esophageal varices without bleeding: Secondary | ICD-10-CM

## 2014-05-19 DIAGNOSIS — D649 Anemia, unspecified: Secondary | ICD-10-CM | POA: Insufficient documentation

## 2014-05-19 DIAGNOSIS — F17200 Nicotine dependence, unspecified, uncomplicated: Secondary | ICD-10-CM

## 2014-05-19 DIAGNOSIS — K922 Gastrointestinal hemorrhage, unspecified: Secondary | ICD-10-CM

## 2014-05-19 DIAGNOSIS — D509 Iron deficiency anemia, unspecified: Secondary | ICD-10-CM | POA: Insufficient documentation

## 2014-05-19 DIAGNOSIS — D518 Other vitamin B12 deficiency anemias: Secondary | ICD-10-CM | POA: Insufficient documentation

## 2014-05-19 DIAGNOSIS — D6489 Other specified anemias: Secondary | ICD-10-CM

## 2014-05-19 DIAGNOSIS — D6959 Other secondary thrombocytopenia: Secondary | ICD-10-CM

## 2014-05-19 LAB — COMPREHENSIVE METABOLIC PANEL
ALT: 38 U/L (ref 0–53)
AST: 71 U/L — ABNORMAL HIGH (ref 0–37)
Albumin: 2.9 g/dL — ABNORMAL LOW (ref 3.5–5.2)
Alkaline Phosphatase: 166 U/L — ABNORMAL HIGH (ref 39–117)
BUN: 18 mg/dL (ref 6–23)
CALCIUM: 9.1 mg/dL (ref 8.4–10.5)
CO2: 24 mEq/L (ref 19–32)
Chloride: 92 mEq/L — ABNORMAL LOW (ref 96–112)
Creatinine, Ser: 1.5 mg/dL — ABNORMAL HIGH (ref 0.50–1.35)
GFR calc non Af Amer: 50 mL/min — ABNORMAL LOW (ref >90)
GFR, EST AFRICAN AMERICAN: 58 mL/min — AB (ref >90)
Glucose, Bld: 546 mg/dL — ABNORMAL HIGH (ref 70–99)
Potassium: 6.2 mEq/L — ABNORMAL HIGH (ref 3.7–5.3)
Sodium: 128 mEq/L — ABNORMAL LOW (ref 137–147)
TOTAL PROTEIN: 7.9 g/dL (ref 6.0–8.3)
Total Bilirubin: 1.7 mg/dL — ABNORMAL HIGH (ref 0.3–1.2)

## 2014-05-19 LAB — CBC WITH DIFFERENTIAL/PLATELET
BASOS PCT: 0 % (ref 0–1)
Basophils Absolute: 0 10*3/uL (ref 0.0–0.1)
EOS ABS: 0.1 10*3/uL (ref 0.0–0.7)
EOS PCT: 3 % (ref 0–5)
HCT: 40.9 % (ref 39.0–52.0)
Hemoglobin: 14.5 g/dL (ref 13.0–17.0)
Lymphocytes Relative: 32 % (ref 12–46)
Lymphs Abs: 1.7 10*3/uL (ref 0.7–4.0)
MCH: 33.4 pg (ref 26.0–34.0)
MCHC: 35.5 g/dL (ref 30.0–36.0)
MCV: 94.2 fL (ref 78.0–100.0)
MONOS PCT: 9 % (ref 3–12)
Monocytes Absolute: 0.5 10*3/uL (ref 0.1–1.0)
Neutro Abs: 2.9 10*3/uL (ref 1.7–7.7)
Neutrophils Relative %: 56 % (ref 43–77)
PLATELETS: 80 10*3/uL — AB (ref 150–400)
RBC: 4.34 MIL/uL (ref 4.22–5.81)
RDW: 14.3 % (ref 11.5–15.5)
WBC: 5.2 10*3/uL (ref 4.0–10.5)

## 2014-05-19 LAB — FERRITIN: FERRITIN: 510 ng/mL — AB (ref 22–322)

## 2014-05-19 NOTE — Progress Notes (Signed)
Primary Care Physician:  [name], PA-C Primary Gastroenterologist:  Dr. Gala Romney  Pre-Procedure History & Physical: HPI:  Kyle [name] is a 58 y.o. male here for followup of [name]/wrists. Had some mild cellulitis near the right thigh. Finishing up a course of doxycycline and sulfa medication orchestrated by PCP. He has 3 bowel movements daily. Takes lactulose and Xifaxan. Aldactone 50 mg and Lasix 40 mg daily  -  his current diuretic regimen. Trying to watch his salt/fluid intake. Weight is stable at 254 pounds.  Doppler studies recently demonstrated a widely patent TIPS.   Past Medical History  Diagnosis Date  . DM (diabetes mellitus)   . Cirrhosis     bx proven steatohepatitis with cirrhosis (2010); per note from Feb 2011, received Hep A and B vaccines in 2010.  [name] HTN (hypertension)   . RLS (restless legs syndrome)   . Hyperlipidemia   . IDA (iron deficiency anemia)   . GERD (gastroesophageal reflux disease)   . Depression   . Peripheral neuropathy   . Urothelial cancer     2010, paillary low-grade, h/o recurrence 2011  . B12 deficiency   . Psoriasis   . 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 Anemia due to multiple mechanisms 05/13/2011  . History of alcohol abuse 05/13/2011  . ANEMIA-IRON DEFICIENCY 03/09/2009  . S/P endoscopy May 2012    4 columns grade II esophageal varices; due for repeat in Nov 2013   . S/P colonoscopy May 2012    Tubular adenoma  . Low back pain   . Cancer     bladder ca 05/2009 removal and  w/chemo wash  . Esophageal varices with bleeding(456.0) 11/24/11    s/p emergent EGD 11/25/11 by Dr. Hilarie Fredrickson at [facility], Grade III esophageal varices s/p banding X 5  . Small bowel obstruction 02/15/2012    Admitted to [facility], managed by Dr. Geroge Baseman, ventral hernia manually reduced  . Ventral hernia 02/15/12  . MRSA (methicillin resistant Staphylococcus aureus)   . Acute post-ligation esophageal ulcer with hemorrhage 04/11/2012  . Clostridium difficile  infection   . H. pylori infection 12/24/2012  . Sleep apnea     [facility] took him off CPAP when had tips procedure  . Sleep apnea     Past Surgical History  Procedure Laterality Date  . Carpel tunnel Right   . Shoulder surgery Left   . Adrenal mass surgery  05/2009    benign, left  . Bladder surgery  01/2009 and 06/2010    cancer 2010, small recurrence in 06/2010  . Colonoscopy  04/2009    moderate int hemorrhoids, rare sigmoid diverticula, one mm sessile hyperplastic rectal polyp  . Esophagogastroduodenoscopy  03/2009    small hh  . Small bowel capsule endoscopy  03/2009    couple of small benign appearing erosions, nonbleeding  . Egd/tcs  08/2007    small hiatal hernia, pancolonic diverticula, friable anal canal, 3cm salmon colored epithelium in distal esophagus, bx negative for Barrett's  . Skin cancer excision  Oct 2012    left arm  . Esophagogastroduodenoscopy  11/25/2011    Dr. Phineas Douglas III varices in the mid and distal esophagus, banding placed, portal astropathy  . Esophagogastroduodenoscopy  01/14/2012    Dr Rourk->4-5 columns Gr2 varices, 6 bands placed, HH, distal esophageal ulcer, portal gastropathy, antral erosions  . Esophagogastroduodenoscopy  03/17/2012    Dr. Larina Bras varices status post band ligation. Hiatal hernia. Portal gastropathy.  . Umbilical hernia repair  04/05/2012  Procedure: HERNIA REPAIR UMBILICAL ADULT;  Surgeon: Donato Heinz, MD;  Location: [facility];  Service: General;  Laterality: N/A;  . Esophagogastroduodenoscopy  04/11/2012    Dr. Bridgett Larsson ulcer possibly at previous banding sites with stigmata     of bleeding.  Attempt at hemostasis with hemoclip and banding was not successful resulting in recurrence of bleed/  Portal gastropathy, but no evidence of gastric varices/ Recurrent esophageal varices grade 2.  . Esophagogastroduodenoscopy  05/24/2012    Dr. Larina Bras varices, portal gastropathy  . Esophagogastroduodenoscopy  04/11/12    Dr.  Drue Novel in the distal esophagus adherent lot and visible vessel treated with bicap. grade I varices in the distal esophagus, one ligating band placed, portal hypertensive gastropathy in the body of the stomach.  . Colonoscopy  09/29/2012    GYF:VCBSWH varices. Scattered pancolonic diverticula/single cecal polyp-removed as described above  . Esophagogastroduodenoscopy  12/24/2012    Dr. Lynwood Dawley significant upper GI bleed secondary to bleeding esophageal varices. s/p hemostasis therapy with injection of a total of 65mL of 5% ethanolamine and application of 9 bands, complete egd not carried out.  . Schlerotherapy  12/24/2012    Procedure: Woodward Ku OF VARICES;  Surgeon: Daneil Dolin, MD;  Location: [facility];  Service: Endoscopy;  Laterality: N/A;  . Esophageal banding  12/24/2012    [id number] significant upper GI bleed secondary to bleeding esophageal varices  . Tips procedure  12/2012    [facility]  . Cataract extraction, bilateral      Prior to Admission medications   Medication Sig Start Date End Date Taking? Authorizing Jama Mcmiller  b complex vitamins tablet Take 1 tablet by mouth daily.   Yes Historical Emlyn Maves, MD  doxycycline (VIBRA-TABS) 100 MG tablet Take 1 tablet (100 mg total) by mouth 2 (two) times daily. 05/15/14  Yes Mary B Dixon, PA-C  furosemide (LASIX) 40 MG tablet Take 40 mg by mouth. 03/03/14  Yes Historical Dyneisha Murchison, MD  gabapentin (NEURONTIN) 300 MG capsule two in AM, two at bedtime   Yes Orlena Sheldon, PA-C  insulin regular human CONCENTRATED (HUMULIN R) 500 UNIT/ML SOLN injection Inject 35 Units into the skin 3 (three) times daily with meals.    Yes Historical Case Vassell, MD  lactulose (CHRONULAC) 10 GM/15ML solution Take 3.33 g by mouth daily.  03/23/13  Yes Mahala Menghini, PA-C  omeprazole (PRILOSEC) 40 MG capsule Take 1 capsule (40 mg total) by mouth daily. 12/22/13  Yes Orvil Feil, NP  propranolol (INDERAL) 20 MG tablet Take 1 tablet (20 mg total)  by mouth 2 (two) times daily. 04/27/14  Yes Daneil Dolin, MD  rifaximin (XIFAXAN) 550 MG TABS tablet Take 1 tablet (550 mg total) by mouth 2 (two) times daily. 08/01/13  Yes Mahala Menghini, PA-C  spironolactone (ALDACTONE) 50 MG tablet Take 50 mg by mouth. 03/03/14 03/03/15 Yes Historical Janesha Brissette, MD  sulfamethoxazole-trimethoprim (BACTRIM DS) 800-160 MG per tablet Take 1 tablet by mouth 2 (two) times daily. 05/15/14  Yes Mary B Dixon, PA-C  zolpidem (AMBIEN) 5 MG tablet Take 1-2 tablets (5-10 mg total) by mouth at bedtime as needed for sleep. 03/07/14  Yes Manon Hilding Kefalas, PA-C  carvedilol (COREG) 6.25 MG tablet TAKE ONE TABLET BY MOUTH TWICE DAILY WITH A MEAL    Mary B Dixon, PA-C    Allergies as of 05/19/2014 - Review Complete 05/19/2014  Allergen Reaction Noted  . Aspirin Other (See Comments) 12/23/2012  . Ibuprofen Other (See Comments) 05/13/2012  . Tylenol [acetaminophen]  Other (See Comments) 07/17/2011    Family History  Problem Relation Age of Onset  . Cirrhosis Father     etoh  . Colon cancer Neg Hx   . Anesthesia problems Neg Hx   . Hypotension Neg Hx   . Malignant hyperthermia Neg Hx   . Pseudochol deficiency Neg Hx   . Kidney cancer Mother   . Cancer Mother   . HIV Brother   . Cirrhosis Brother     nash    History   Social History  . Marital Status: Married    Spouse Name: N/A    Number of Children: 3  . Years of Education: N/A   Occupational History  . disabled    Social History Main Topics  . Smoking status: Current Every Day Smoker -- 1.50 packs/day for 30 years    Types: Cigarettes  . Smokeless tobacco: Never Used  . Alcohol Use: No     Comment: drank heavily for few years in 20s  . Drug Use: No  . Sexual Activity: Yes    Birth Control/ Protection: None   Other Topics Concern  . Not on file   Social History Narrative  . No narrative on file    Review of Systems: See HPI, otherwise negative ROS  Physical Exam: BP 111/70  Pulse 78  Temp(Src)  97.6 F (36.4 C) (Oral)  Ht 5\' 7"  (1.702 m)  Wt 254 lb 12.8 oz (115.577 kg)  BMI 39.90 kg/m2 General:   Alert,  Well-developed, well-nourished, pleasant and cooperative in NAD. No asterixis Skin:   no jaundice  Eyes:  Sclera clear, no icterus.   Conjunctiva pink. Ears:  Normal auditory acuity. Nose:  No deformity, discharge,  or lesions. Mouth:  No deformity or lesions. Neck:  Supple; no masses or thyromegaly. No significant cervical adenopathy. Lungs:  Clear throughout to auscultation.   No wheezes, crackles, or rhonchi. No acute distress. Heart:  Regular rate and rhythm; no murmurs, clicks, rubs,  or gallops. Abdomen: Obese. Positive bowel sounds. Liver edge 4 fingerbreadths below the right costal margin by percussion. Spleen palpable. No obvious shifting of fluid wave or dullness to percussion Pulses:  Normal pulses noted. Extremities:  Without clubbing or edema.  Impression/Plan:  [name]/cirrhosis -  well compensated at this time. Recent cellulitis to improve. He looks to be fairly euvolemic at this time. He's planning to see the [facility] at [facility] in the near future  Medications reviewed with patient today.  Continue present medical regimen  Office visit in 3 months (BMET just before OV)    Notice: This dictation was prepared with Dragon dictation along with smaller phrase technology. Any transcriptional errors that result from this process are unintentional and may not be corrected upon review.

## 2014-05-19 NOTE — Progress Notes (Signed)
LABS DRAWN FOR FERR, CMP,CBCD

## 2014-05-19 NOTE — Patient Instructions (Signed)
[facility] Discharge Instructions  RECOMMENDATIONS MADE BY THE CONSULTANT AND ANY TEST RESULTS WILL BE SENT TO YOUR REFERRING PHYSICIAN.  EXAM FINDINGS BY THE PHYSICIAN TODAY AND SIGNS OR SYMPTOMS TO REPORT TO CLINIC OR PRIMARY PHYSICIAN: Exam and findings as discussed by Kyle Pane, PA- C.  Will check labs to day and every 6 weeks.  MEDICATIONS PRESCRIBED:  none  INSTRUCTIONS/FOLLOW-UP: Follow-up in 4 - 6 months.  Thank you for choosing [facility] to provide your oncology and hematology care.  To afford each patient quality time with our providers, please arrive at least 15 minutes before your scheduled appointment time.  With your help, our goal is to use those 15 minutes to complete the necessary work-up to ensure our physicians have the information they need to help with your evaluation and healthcare recommendations.    Effective January 1st, 2014, we ask that you re-schedule your appointment with our physicians should you arrive 10 or more minutes late for your appointment.  We strive to give you quality time with our providers, and arriving late affects you and other patients whose appointments are after yours.    Again, thank you for choosing [facility].  Our hope is that these requests will decrease the amount of time that you wait before being seen by our physicians.       _____________________________________________________________  Should you have questions after your visit to [facility], please contact our office at [phone] between the hours of 8:30 a.m. and 4:30 p.m.  Voicemails left after 4:30 p.m. will not be returned until the following business day.  For prescription refill requests, have your pharmacy contact our office with your prescription refill request.    _______________________________________________________________  We hope that we have given you very good care.  You may receive a patient  satisfaction survey in the mail, please complete it and return it as soon as possible.  We value your feedback!

## 2014-05-19 NOTE — Patient Instructions (Signed)
Medications reviewed with you today.  Continue present medical regimen  Office visit in 3 months (BMET just before OV)

## 2014-05-20 ENCOUNTER — Other Ambulatory Visit: Payer: Self-pay | Admitting: Physician Assistant

## 2014-05-22 NOTE — Telephone Encounter (Signed)
Medication refilled per protocol. 

## 2014-05-23 ENCOUNTER — Telehealth: Payer: Self-pay | Admitting: Family Medicine

## 2014-05-23 DIAGNOSIS — IMO0001 Reserved for inherently not codable concepts without codable children: Secondary | ICD-10-CM

## 2014-05-23 DIAGNOSIS — E1165 Type 2 diabetes mellitus with hyperglycemia: Principal | ICD-10-CM

## 2014-05-23 MED ORDER — GLUCOSE BLOOD VI STRP: 1.0000 | ORAL_STRIP | Freq: Three times a day (TID) | Status: DC

## 2014-05-23 NOTE — Telephone Encounter (Signed)
Spoke to patient about wound on thigh.  States area is healing nicely.  Is still on antibiotics.  Stressed importance that he complete both until finished.  He asked to refill his diabetic test strips and that was done.

## 2014-05-23 NOTE — Telephone Encounter (Signed)
Message copied by Olena Mater on Tue May 23, 2014 11:50 AM ------      Message from: Dena Billet      Created: Thu May 18, 2014  3:32 PM       At office visit I prescribed Bactrim and doxycycline.      Culture shows that this infection is sensitive to both of these medicines.      Followup with patient to make sure that he is taking both of the medications as directed and make sure that the abscess is improving. ------

## 2014-06-30 ENCOUNTER — Encounter (HOSPITAL_COMMUNITY): Payer: Medicaid Other | Attending: Oncology

## 2014-06-30 DIAGNOSIS — D518 Other vitamin B12 deficiency anemias: Secondary | ICD-10-CM | POA: Diagnosis present

## 2014-06-30 DIAGNOSIS — I8500 Esophageal varices without bleeding: Secondary | ICD-10-CM | POA: Diagnosis present

## 2014-06-30 DIAGNOSIS — D509 Iron deficiency anemia, unspecified: Secondary | ICD-10-CM | POA: Diagnosis not present

## 2014-06-30 DIAGNOSIS — D649 Anemia, unspecified: Secondary | ICD-10-CM | POA: Insufficient documentation

## 2014-06-30 DIAGNOSIS — K7460 Unspecified cirrhosis of liver: Secondary | ICD-10-CM | POA: Insufficient documentation

## 2014-06-30 DIAGNOSIS — K922 Gastrointestinal hemorrhage, unspecified: Secondary | ICD-10-CM | POA: Insufficient documentation

## 2014-06-30 DIAGNOSIS — D6959 Other secondary thrombocytopenia: Secondary | ICD-10-CM | POA: Diagnosis present

## 2014-06-30 LAB — CBC WITH DIFFERENTIAL/PLATELET
BASOS PCT: 0 % (ref 0–1)
Basophils Absolute: 0 10*3/uL (ref 0.0–0.1)
Eosinophils Absolute: 0.1 10*3/uL (ref 0.0–0.7)
Eosinophils Relative: 1 % (ref 0–5)
HCT: 39.9 % (ref 39.0–52.0)
HEMOGLOBIN: 14.3 g/dL (ref 13.0–17.0)
LYMPHS ABS: 1.5 10*3/uL (ref 0.7–4.0)
Lymphocytes Relative: 23 % (ref 12–46)
MCH: 34.2 pg — ABNORMAL HIGH (ref 26.0–34.0)
MCHC: 35.8 g/dL (ref 30.0–36.0)
MCV: 95.5 fL (ref 78.0–100.0)
MONOS PCT: 9 % (ref 3–12)
Monocytes Absolute: 0.6 10*3/uL (ref 0.1–1.0)
NEUTROS ABS: 4.4 10*3/uL (ref 1.7–7.7)
Neutrophils Relative %: 67 % (ref 43–77)
Platelets: 83 10*3/uL — ABNORMAL LOW (ref 150–400)
RBC: 4.18 MIL/uL — AB (ref 4.22–5.81)
RDW: 14.1 % (ref 11.5–15.5)
WBC: 6.5 10*3/uL (ref 4.0–10.5)

## 2014-06-30 LAB — FERRITIN: Ferritin: 427 ng/mL — ABNORMAL HIGH (ref 22–322)

## 2014-06-30 NOTE — Progress Notes (Signed)
Labs drawn for cbcd,ferr. 

## 2014-07-07 ENCOUNTER — Ambulatory Visit (HOSPITAL_COMMUNITY)
Admission: RE | Admit: 2014-07-07 | Discharge: 2014-07-07 | Disposition: A | Discharge status: Home / Self Care | Payer: Medicaid Other | Source: Ambulatory Visit | Attending: Family Medicine | Admitting: Family Medicine

## 2014-07-07 ENCOUNTER — Encounter: Payer: Self-pay | Admitting: Family Medicine

## 2014-07-07 ENCOUNTER — Ambulatory Visit (INDEPENDENT_AMBULATORY_CARE_PROVIDER_SITE_OTHER): Payer: Medicaid Other | Admitting: Family Medicine

## 2014-07-07 VITALS — BP 110/70 | HR 80 | Temp 98.1°F | Resp 20 | Ht 67.0 in | Wt 250.0 lb

## 2014-07-07 DIAGNOSIS — R05 Cough: Secondary | ICD-10-CM | POA: Insufficient documentation

## 2014-07-07 DIAGNOSIS — R059 Cough, unspecified: Secondary | ICD-10-CM | POA: Diagnosis not present

## 2014-07-07 DIAGNOSIS — F17200 Nicotine dependence, unspecified, uncomplicated: Secondary | ICD-10-CM | POA: Insufficient documentation

## 2014-07-07 DIAGNOSIS — J42 Unspecified chronic bronchitis: Secondary | ICD-10-CM | POA: Diagnosis not present

## 2014-07-07 DIAGNOSIS — R252 Cramp and spasm: Secondary | ICD-10-CM

## 2014-07-07 LAB — CBC WITH DIFFERENTIAL/PLATELET
BASOS ABS: 0 10*3/uL (ref 0.0–0.1)
BASOS PCT: 0 % (ref 0–1)
EOS ABS: 0.1 10*3/uL (ref 0.0–0.7)
Eosinophils Relative: 2 % (ref 0–5)
HCT: 40.2 % (ref 39.0–52.0)
Hemoglobin: 14.1 g/dL (ref 13.0–17.0)
Lymphocytes Relative: 24 % (ref 12–46)
Lymphs Abs: 1.6 10*3/uL (ref 0.7–4.0)
MCH: 33.5 pg (ref 26.0–34.0)
MCHC: 35.1 g/dL (ref 30.0–36.0)
MCV: 95.5 fL (ref 78.0–100.0)
Monocytes Absolute: 0.5 10*3/uL (ref 0.1–1.0)
Monocytes Relative: 8 % (ref 3–12)
NEUTROS ABS: 4.4 10*3/uL (ref 1.7–7.7)
NEUTROS PCT: 66 % (ref 43–77)
Platelets: 90 10*3/uL — ABNORMAL LOW (ref 150–400)
RBC: 4.21 MIL/uL — ABNORMAL LOW (ref 4.22–5.81)
RDW: 14.4 % (ref 11.5–15.5)
WBC: 6.7 10*3/uL (ref 4.0–10.5)

## 2014-07-07 LAB — COMPLETE METABOLIC PANEL WITH GFR
ALBUMIN: 3 g/dL — AB (ref 3.5–5.2)
ALK PHOS: 130 U/L — AB (ref 39–117)
ALT: 40 U/L (ref 0–53)
AST: 56 U/L — ABNORMAL HIGH (ref 0–37)
BUN: 16 mg/dL (ref 6–23)
CHLORIDE: 95 meq/L — AB (ref 96–112)
CO2: 26 meq/L (ref 19–32)
Calcium: 9.5 mg/dL (ref 8.4–10.5)
Creat: 1.11 mg/dL (ref 0.50–1.35)
GFR, EST NON AFRICAN AMERICAN: 73 mL/min
GFR, Est African American: 85 mL/min
GLUCOSE: 393 mg/dL — AB (ref 70–99)
Potassium: 4.8 mEq/L (ref 3.5–5.3)
SODIUM: 132 meq/L — AB (ref 135–145)
TOTAL PROTEIN: 7.9 g/dL (ref 6.0–8.3)
Total Bilirubin: 2.1 mg/dL — ABNORMAL HIGH (ref 0.2–1.2)

## 2014-07-07 LAB — MAGNESIUM: MAGNESIUM: 1.6 mg/dL (ref 1.5–2.5)

## 2014-07-07 MED ORDER — LEVOFLOXACIN 500 MG PO TABS: 500.0000 mg | ORAL_TABLET | Freq: Every day | ORAL | Status: DC

## 2014-07-07 NOTE — Progress Notes (Signed)
Subjective:    Patient ID: Kyle [name], male    DOB: 09-02-1956, 58 y.o.   MRN: [id number]  HPI Patient has had a chronic nonproductive cough for the last 4 weeks. However at times he coughs so hard that he feels like he may pass out. He denies any fevers although he does report occasional shortness of breath and difficulty catching his breath due to coughing spells.  He continues to smoke one half to one pack of cigarettes per day. He has been for more than 20 years. He denies any hemoptysis or chest pain. He denies any wheezing. He has no diagnosis of COPD or asthma. He denies any night sweats or chills.  He does complain of locking in the fifth digits bilaterally. The locking occurs at the PIP joint. The patient visibly has a trigger finger on examination. He also complains of severe pains and muscle cramps in his hands. He times his hands were drawn up into a ball uncontrollably as if it were a slow gradual cramp occurring.  The thenar eminence will draw into his palm and his 2 digit will contract gradually at the mcp joint.  Reviewing his most recent lab work reveals a blood sugar greater than 500, potassium 6.2. This was checked in June and has not been checked since. Past Medical History  Diagnosis Date  . DM (diabetes mellitus)   . Cirrhosis     bx proven steatohepatitis with cirrhosis (2010); per note from Feb 2011, received Hep A and B vaccines in 2010.  [name] HTN (hypertension)   . RLS (restless legs syndrome)   . Hyperlipidemia   . IDA (iron deficiency anemia)   . GERD (gastroesophageal reflux disease)   . Depression   . Peripheral neuropathy   . Urothelial cancer     2010, paillary low-grade, h/o recurrence 2011  . B12 deficiency   . Psoriasis   . 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 Anemia due to multiple mechanisms 05/13/2011  . History of alcohol abuse 05/13/2011  . ANEMIA-IRON DEFICIENCY 03/09/2009  . S/P endoscopy May 2012    4 columns grade II esophageal  varices; due for repeat in Nov 2013   . S/P colonoscopy May 2012    Tubular adenoma  . Low back pain   . Cancer     bladder ca 05/2009 removal and  w/chemo wash  . Esophageal varices with bleeding(456.0) 11/24/11    s/p emergent EGD 11/25/11 by Dr. Hilarie Fredrickson at [facility], Grade III esophageal varices s/p banding X 5  . Small bowel obstruction 02/15/2012    Admitted to [facility], managed by Dr. Geroge Baseman, ventral hernia manually reduced  . Ventral hernia 02/15/12  . MRSA (methicillin resistant Staphylococcus aureus)   . Acute post-ligation esophageal ulcer with hemorrhage 04/11/2012  . Clostridium difficile infection   . H. pylori infection 12/24/2012  . Sleep apnea     [facility] took him off CPAP when had tips procedure  . Sleep apnea   . Cellulitis 04/2014    right upper leg   Past Surgical History  Procedure Laterality Date  . Carpel tunnel Right   . Shoulder surgery Left   . Adrenal mass surgery  05/2009    benign, left  . Bladder surgery  01/2009 and 06/2010    cancer 2010, small recurrence in 06/2010  . Colonoscopy  04/2009    moderate int hemorrhoids, rare sigmoid diverticula, one mm sessile hyperplastic rectal polyp  . Esophagogastroduodenoscopy  03/2009    small  hh  . Small bowel capsule endoscopy  03/2009    couple of small benign appearing erosions, nonbleeding  . Egd/tcs  08/2007    small hiatal hernia, pancolonic diverticula, friable anal canal, 3cm salmon colored epithelium in distal esophagus, bx negative for Barrett's  . Skin cancer excision  Oct 2012    left arm  . Esophagogastroduodenoscopy  11/25/2011    Dr. Phineas Douglas III varices in the mid and distal esophagus, banding placed, portal astropathy  . Esophagogastroduodenoscopy  01/14/2012    Dr Rourk->4-5 columns Gr2 varices, 6 bands placed, HH, distal esophageal ulcer, portal gastropathy, antral erosions  . Esophagogastroduodenoscopy  03/17/2012    Dr. Larina Bras varices status post band ligation. Hiatal hernia. Portal gastropathy.  .  Umbilical hernia repair  04/05/2012    Procedure: HERNIA REPAIR UMBILICAL ADULT;  Surgeon: Donato Heinz, MD;  Location: [facility];  Service: General;  Laterality: N/A;  . Esophagogastroduodenoscopy  04/11/2012    Dr. Bridgett Larsson ulcer possibly at previous banding sites with stigmata     of bleeding.  Attempt at hemostasis with hemoclip and banding was not successful resulting in recurrence of bleed/  Portal gastropathy, but no evidence of gastric varices/ Recurrent esophageal varices grade 2.  . Esophagogastroduodenoscopy  05/24/2012    Dr. Larina Bras varices, portal gastropathy  . Esophagogastroduodenoscopy  04/11/12    Dr. Drue Novel in the distal esophagus adherent lot and visible vessel treated with bicap. grade I varices in the distal esophagus, one ligating band placed, portal hypertensive gastropathy in the body of the stomach.  . Colonoscopy  09/29/2012    IWP:[id number] varices. Scattered pancolonic diverticula/single cecal polyp-removed as described above  . Esophagogastroduodenoscopy  12/24/2012    Dr. Lynwood Dawley significant upper GI bleed secondary to bleeding esophageal varices. s/p hemostasis therapy with injection of a total of 67mL of 5% ethanolamine and application of 9 bands, complete egd not carried out.  . Schlerotherapy  12/24/2012    Procedure: Woodward Ku OF VARICES;  Surgeon: Daneil Dolin, MD;  Location: [facility];  Service: Endoscopy;  Laterality: N/A;  . Esophageal banding  12/24/2012    [id number] significant upper GI bleed secondary to bleeding esophageal varices  . Tips procedure  12/2012    [facility]  . Cataract extraction, bilateral     Current Outpatient Prescriptions on File Prior to Visit  Medication Sig Dispense Refill  . b complex vitamins tablet Take 1 tablet by mouth daily.      . carvedilol (COREG) 6.25 MG tablet TAKE ONE TABLET BY MOUTH TWICE DAILY WITH A MEAL  60 tablet  0  . furosemide (LASIX) 40 MG tablet Take 40 mg by mouth.       . gabapentin (NEURONTIN) 300 MG capsule TAKE 1 CAPSULE BY MOUTH IN THE MORNING, TAKE 1 CAPSULE BY MOUTH AT NOON, THEN TAKE 3 CAPSULES BY MOUTH AT BEDTIME  150 capsule  5  . glucose blood (ACCU-CHEK AVIVA) test strip 1 each by Other route 3 (three) times daily. Use as instructed  100 each  12  . insulin regular human CONCENTRATED (HUMULIN R) 500 UNIT/ML SOLN injection Inject 35 Units into the skin 3 (three) times daily with meals.       . lactulose (CHRONULAC) 10 GM/15ML solution Take 3.33 g by mouth daily.       [name] omeprazole (PRILOSEC) 40 MG capsule Take 1 capsule (40 mg total) by mouth daily.  30 capsule  11  . propranolol (INDERAL) 20 MG tablet Take 1 tablet (20 mg  total) by mouth 2 (two) times daily.  60 tablet  11  . rifaximin (XIFAXAN) 550 MG TABS tablet Take 1 tablet (550 mg total) by mouth 2 (two) times daily.  60 tablet  11  . spironolactone (ALDACTONE) 50 MG tablet Take 50 mg by mouth daily.       [name] zolpidem (AMBIEN) 5 MG tablet Take 1-2 tablets (5-10 mg total) by mouth at bedtime as needed for sleep.  60 tablet  2   No current facility-administered medications on file prior to visit.   Allergies  Allergen Reactions  . Aspirin Other (See Comments)    Harms liver.   . Ibuprofen Other (See Comments)    Can't take per Dr Gala Romney- harms livers  . Tylenol [Acetaminophen] Other (See Comments)    Restless Legs    History   Social History  . Marital Status: Married    Spouse Name: N/A    Number of Children: 3  . Years of Education: N/A   Occupational History  . disabled    Social History Main Topics  . Smoking status: Current Every Day Smoker -- 1.50 packs/day for 30 years    Types: Cigarettes  . Smokeless tobacco: Never Used  . Alcohol Use: No     Comment: drank heavily for few years in 20s  . Drug Use: No  . Sexual Activity: Yes    Birth Control/ Protection: None   Other Topics Concern  . Not on file   Social History Narrative  . No narrative on file       Review of Systems  All other systems reviewed and are negative.      Objective:   Physical Exam  Vitals reviewed. Constitutional: He is oriented to person, place, and time. He appears well-developed and well-nourished. No distress.  Neck: Neck supple. No JVD present. No thyromegaly present.  Cardiovascular: Normal rate, regular rhythm and normal heart sounds.   Pulmonary/Chest: No accessory muscle usage. Not tachypneic. No respiratory distress. He has decreased breath sounds. He has no wheezes. He has no rhonchi. He has no rales.  Lymphadenopathy:    He has no cervical adenopathy.  Neurological: He is alert and oriented to person, place, and time. He has normal reflexes. He displays normal reflexes. No cranial nerve deficit. He exhibits normal muscle tone. Coordination normal.  Skin: He is not diaphoretic.          Assessment & Plan:  1. Unspecified chronic bronchitis Patient has chronic bronchitis likely due to smoking. I instructed patient on Levaquin 500 mg by mouth daily for 7 days and obtain a chest x-ray. Recheck here next week.  I recommended smoking cessation - levofloxacin (LEVAQUIN) 500 MG tablet; Take 1 tablet (500 mg total) by mouth daily.  Dispense: 7 tablet; Refill: 0 - DG Chest 2 View; Future  2. Muscle cramps I believe the patient is complaining of uncontrolled muscle fasciculations and cramps likely due to electrolyte imbalances due to severe medical problems. Check a CMP, and magnesium level. I rechecked the patient here Monday. - COMPLETE METABOLIC PANEL WITH GFR - Magnesium - CBC with Differential

## 2014-07-10 ENCOUNTER — Ambulatory Visit (INDEPENDENT_AMBULATORY_CARE_PROVIDER_SITE_OTHER): Payer: Medicaid Other | Admitting: Family Medicine

## 2014-07-10 ENCOUNTER — Encounter: Payer: Self-pay | Admitting: Family Medicine

## 2014-07-10 VITALS — BP 110/64 | HR 80 | Temp 98.9°F | Resp 22 | Ht 67.0 in | Wt 250.0 lb

## 2014-07-10 DIAGNOSIS — IMO0001 Reserved for inherently not codable concepts without codable children: Secondary | ICD-10-CM

## 2014-07-10 DIAGNOSIS — R252 Cramp and spasm: Secondary | ICD-10-CM

## 2014-07-10 DIAGNOSIS — E1165 Type 2 diabetes mellitus with hyperglycemia: Secondary | ICD-10-CM

## 2014-07-10 NOTE — Progress Notes (Signed)
Subjective:    Patient ID: Kyle [name], male    DOB: Oct 04, 1956, 58 y.o.   MRN: [id number]  HPI 07/07/14 Patient has had a chronic nonproductive cough for the last 4 weeks. However at times he coughs so hard that he feels like he may pass out. He denies any fevers although he does report occasional shortness of breath and difficulty catching his breath due to coughing spells.  He continues to smoke one half to one pack of cigarettes per day. He has been for more than 20 years. He denies any hemoptysis or chest pain. He denies any wheezing. He has no diagnosis of COPD or asthma. He denies any night sweats or chills.  He does complain of locking in the fifth digits bilaterally. The locking occurs at the PIP joint. The patient visibly has a trigger finger on examination. He also complains of severe pains and muscle cramps in his hands. He times his hands were drawn up into a ball uncontrollably as if it were a slow gradual cramp occurring.  The thenar eminence will draw into his palm and his 2 digit will contract gradually at the mcp joint.  Reviewing his most recent lab work reveals a blood sugar greater than 500, potassium 6.2. This was checked in June and has not been checked since.  At that time, my plan was: 1. Unspecified chronic bronchitis Patient has chronic bronchitis likely due to smoking. I instructed patient on Levaquin 500 mg by mouth daily for 7 days and obtain a chest x-ray. Recheck here next week.  I recommended smoking cessation - levofloxacin (LEVAQUIN) 500 MG tablet; Take 1 tablet (500 mg total) by mouth daily.  Dispense: 7 tablet; Refill: 0 - DG Chest 2 View; Future  2. Muscle cramps I believe the patient is complaining of uncontrolled muscle fasciculations and cramps likely due to electrolyte imbalances due to severe medical problems. Check a CMP, and magnesium level. I rechecked the patient here Monday. - COMPLETE METABOLIC PANEL WITH GFR - Magnesium - CBC with  Differential  07/10/14 Patient's cough is gradually improving. His chest x-ray was clear. Unfortunately he continues to smoke. I reviewed his lab work with the patient. There was no evidence of electrolyte disturbances however his blood sugar was almost 400. Patient is currently using insulin 45 units 3 times a day.  Patient last saw his endocrinologist in January. At that time he was supposed to be taking 150 units 3 times a day. He is yet to follow up with his endocrinologist. His blood sugars are greater than 350. Past Medical History  Diagnosis Date  . DM (diabetes mellitus)   . Cirrhosis     bx proven steatohepatitis with cirrhosis (2010); per note from Feb 2011, received Hep A and B vaccines in 2010.  [name] HTN (hypertension)   . RLS (restless legs syndrome)   . Hyperlipidemia   . IDA (iron deficiency anemia)   . GERD (gastroesophageal reflux disease)   . Depression   . Peripheral neuropathy   . Urothelial cancer     2010, paillary low-grade, h/o recurrence 2011  . B12 deficiency   . Psoriasis   . 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 Anemia due to multiple mechanisms 05/13/2011  . History of alcohol abuse 05/13/2011  . ANEMIA-IRON DEFICIENCY 03/09/2009  . S/P endoscopy May 2012    4 columns grade II esophageal varices; due for repeat in Nov 2013   . S/P colonoscopy May 2012    Tubular adenoma  .  Low back pain   . Cancer     bladder ca 05/2009 removal and  w/chemo wash  . Esophageal varices with bleeding(456.0) 11/24/11    s/p emergent EGD 11/25/11 by Dr. Hilarie Fredrickson at [facility], Grade III esophageal varices s/p banding X 5  . Small bowel obstruction 02/15/2012    Admitted to [facility], managed by Dr. Geroge Baseman, ventral hernia manually reduced  . Ventral hernia 02/15/12  . MRSA (methicillin resistant Staphylococcus aureus)   . Acute post-ligation esophageal ulcer with hemorrhage 04/11/2012  . Clostridium difficile infection   . H. pylori infection 12/24/2012  . Sleep apnea     [facility] took  him off CPAP when had tips procedure  . Sleep apnea   . Cellulitis 04/2014    right upper leg   Past Surgical History  Procedure Laterality Date  . Carpel tunnel Right   . Shoulder surgery Left   . Adrenal mass surgery  05/2009    benign, left  . Bladder surgery  01/2009 and 06/2010    cancer 2010, small recurrence in 06/2010  . Colonoscopy  04/2009    moderate int hemorrhoids, rare sigmoid diverticula, one mm sessile hyperplastic rectal polyp  . Esophagogastroduodenoscopy  03/2009    small hh  . Small bowel capsule endoscopy  03/2009    couple of small benign appearing erosions, nonbleeding  . Egd/tcs  08/2007    small hiatal hernia, pancolonic diverticula, friable anal canal, 3cm salmon colored epithelium in distal esophagus, bx negative for Barrett's  . Skin cancer excision  Oct 2012    left arm  . Esophagogastroduodenoscopy  11/25/2011    Dr. Phineas Douglas III varices in the mid and distal esophagus, banding placed, portal astropathy  . Esophagogastroduodenoscopy  01/14/2012    Dr Rourk->4-5 columns Gr2 varices, 6 bands placed, HH, distal esophageal ulcer, portal gastropathy, antral erosions  . Esophagogastroduodenoscopy  03/17/2012    Dr. Larina Bras varices status post band ligation. Hiatal hernia. Portal gastropathy.  . Umbilical hernia repair  04/05/2012    Procedure: HERNIA REPAIR UMBILICAL ADULT;  Surgeon: Donato Heinz, MD;  Location: [facility];  Service: General;  Laterality: N/A;  . Esophagogastroduodenoscopy  04/11/2012    Dr. Bridgett Larsson ulcer possibly at previous banding sites with stigmata     of bleeding.  Attempt at hemostasis with hemoclip and banding was not successful resulting in recurrence of bleed/  Portal gastropathy, but no evidence of gastric varices/ Recurrent esophageal varices grade 2.  . Esophagogastroduodenoscopy  05/24/2012    Dr. Larina Bras varices, portal gastropathy  . Esophagogastroduodenoscopy  04/11/12    Dr. Drue Novel in the distal  esophagus adherent lot and visible vessel treated with bicap. grade I varices in the distal esophagus, one ligating band placed, portal hypertensive gastropathy in the body of the stomach.  . Colonoscopy  09/29/2012    [id number] varices. Scattered pancolonic diverticula/single cecal polyp-removed as described above  . Esophagogastroduodenoscopy  12/24/2012    Dr. Lynwood Dawley significant upper GI bleed secondary to bleeding esophageal varices. s/p hemostasis therapy with injection of a total of 56mL of 5% ethanolamine and application of 9 bands, complete egd not carried out.  . Schlerotherapy  12/24/2012    Procedure: Woodward Ku OF VARICES;  Surgeon: Daneil Dolin, MD;  Location: [facility];  Service: Endoscopy;  Laterality: N/A;  . Esophageal banding  12/24/2012    [id number] significant upper GI bleed secondary to bleeding esophageal varices  . Tips procedure  12/2012    [facility]  . Cataract extraction, bilateral  Current Outpatient Prescriptions on File Prior to Visit  Medication Sig Dispense Refill  . b complex vitamins tablet Take 1 tablet by mouth daily.      . carvedilol (COREG) 6.25 MG tablet TAKE ONE TABLET BY MOUTH TWICE DAILY WITH A MEAL  60 tablet  0  . furosemide (LASIX) 40 MG tablet Take 40 mg by mouth.      . gabapentin (NEURONTIN) 300 MG capsule TAKE 1 CAPSULE BY MOUTH IN THE MORNING, TAKE 1 CAPSULE BY MOUTH AT NOON, THEN TAKE 3 CAPSULES BY MOUTH AT BEDTIME  150 capsule  5  . glucose blood (ACCU-CHEK AVIVA) test strip 1 each by Other route 3 (three) times daily. Use as instructed  100 each  12  . insulin regular human CONCENTRATED (HUMULIN R) 500 UNIT/ML SOLN injection Inject 35 Units into the skin 3 (three) times daily with meals.       . lactulose (CHRONULAC) 10 GM/15ML solution Take 3.33 g by mouth daily.       [name] levofloxacin (LEVAQUIN) 500 MG tablet Take 1 tablet (500 mg total) by mouth daily.  7 tablet  0  . omeprazole (PRILOSEC) 40 MG capsule Take 1  capsule (40 mg total) by mouth daily.  30 capsule  11  . propranolol (INDERAL) 20 MG tablet Take 1 tablet (20 mg total) by mouth 2 (two) times daily.  60 tablet  11  . rifaximin (XIFAXAN) 550 MG TABS tablet Take 1 tablet (550 mg total) by mouth 2 (two) times daily.  60 tablet  11  . spironolactone (ALDACTONE) 50 MG tablet Take 50 mg by mouth daily.       [name] zolpidem (AMBIEN) 5 MG tablet Take 1-2 tablets (5-10 mg total) by mouth at bedtime as needed for sleep.  60 tablet  2   No current facility-administered medications on file prior to visit.   Allergies  Allergen Reactions  . Aspirin Other (See Comments)    Harms liver.   . Ibuprofen Other (See Comments)    Can't take per Dr Gala Romney- harms livers  . Tylenol [Acetaminophen] Other (See Comments)    Restless Legs    History   Social History  . Marital Status: Married    Spouse Name: N/A    Number of Children: 3  . Years of Education: N/A   Occupational History  . disabled    Social History Main Topics  . Smoking status: Current Every Day Smoker -- 1.50 packs/day for 30 years    Types: Cigarettes  . Smokeless tobacco: Never Used  . Alcohol Use: No     Comment: drank heavily for few years in 20s  . Drug Use: No  . Sexual Activity: Yes    Birth Control/ Protection: None   Other Topics Concern  . Not on file   Social History Narrative  . No narrative on file      Review of Systems  All other systems reviewed and are negative.      Objective:   Physical Exam  Vitals reviewed. Constitutional: He is oriented to person, place, and time. He appears well-developed and well-nourished. No distress.  Neck: Neck supple. No JVD present. No thyromegaly present.  Cardiovascular: Normal rate, regular rhythm and normal heart sounds.   Pulmonary/Chest: No accessory muscle usage. Not tachypneic. No respiratory distress. He has decreased breath sounds. He has no wheezes. He has no rhonchi. He has no rales.  Lymphadenopathy:    He has  no cervical adenopathy.  Neurological:  He is alert and oriented to person, place, and time. He has normal reflexes. No cranial nerve deficit. He exhibits normal muscle tone. Coordination normal.  Skin: He is not diaphoretic.          Assessment & Plan:  Muscle cramps  Type II or unspecified type diabetes mellitus without mention of complication, uncontrolled I believe the patient's uncontrolled muscle cramps and muscle fasciculations are likely due to electrolyte disturbances due to his uncontrolled diabetes. I recommended he follow up immediately with his endocrinologist. Meanwhile he can increase his insulin to 70 units 3 times a day. Complete once his electrolytes and sugars are better controlled, his muscle fasciculations and cramps should improve. Also recommended smoking cessation.

## 2014-07-11 ENCOUNTER — Ambulatory Visit: Payer: Medicaid Other | Admitting: Family Medicine

## 2014-07-27 ENCOUNTER — Other Ambulatory Visit: Payer: Self-pay

## 2014-08-01 ENCOUNTER — Telehealth: Payer: Self-pay | Admitting: Internal Medicine

## 2014-08-01 ENCOUNTER — Telehealth: Payer: Self-pay | Admitting: Physician Assistant

## 2014-08-01 MED ORDER — LACTULOSE 10 GM/15ML PO SOLN: ORAL | Status: DC

## 2014-08-01 NOTE — Telephone Encounter (Signed)
Pt called this morning asking about his lactose prescription. He said that it hasn't been called into [facility] and he wasn't sure if he was to continue taking it or if we forgot to call it in. Please advise and call Fred back this afternoon at [phone] if he isn't there you can leave a message.

## 2014-08-01 NOTE — Telephone Encounter (Signed)
Patient says the same situation has happened this time as last time, he says when he goes to visit his mother in the hospital, he gets a staff infection, well he has another staff infection would like to know if we can call in an antibiotic for him  [phone]  [facility] [place]

## 2014-08-01 NOTE — Telephone Encounter (Signed)
Patient needs OV.   Call placed to patient. [place]an.

## 2014-08-01 NOTE — Telephone Encounter (Signed)
He is aware that it has been called in

## 2014-08-02 NOTE — Telephone Encounter (Signed)
Call placed to patient. LMTRC.  

## 2014-08-03 ENCOUNTER — Ambulatory Visit (INDEPENDENT_AMBULATORY_CARE_PROVIDER_SITE_OTHER): Payer: Medicaid Other | Admitting: Family Medicine

## 2014-08-03 ENCOUNTER — Encounter: Payer: Self-pay | Admitting: Family Medicine

## 2014-08-03 VITALS — BP 124/82 | HR 86 | Temp 98.2°F | Resp 18 | Ht 67.0 in | Wt 256.0 lb

## 2014-08-03 DIAGNOSIS — L03119 Cellulitis of unspecified part of limb: Principal | ICD-10-CM

## 2014-08-03 DIAGNOSIS — L02419 Cutaneous abscess of limb, unspecified: Secondary | ICD-10-CM

## 2014-08-03 MED ORDER — DOXYCYCLINE HYCLATE 100 MG PO TABS: 100.0000 mg | ORAL_TABLET | Freq: Two times a day (BID) | ORAL | Status: DC

## 2014-08-03 NOTE — Progress Notes (Signed)
Subjective:    Patient ID: Kyle [name], male    DOB: Jul 10, 1956, 58 y.o.   MRN: [id number]  HPI Patient has a complicated past medical history including diabetes mellitus type 2, psoriasis, psoriasis. 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 Her last week he developed erythematous tender warm indurated areas on his right hip and also in his right gluteus. These are approximately 3-4 cm in diameter each. The skin is warm tender and indurated. There is no fluctuance or abscess yet. Past Medical History  Diagnosis Date  . DM (diabetes mellitus)   . Cirrhosis     bx proven steatohepatitis with cirrhosis (2010); per note from Feb 2011, received Hep A and B vaccines in 2010.  [name] HTN (hypertension)   . RLS (restless legs syndrome)   . Hyperlipidemia   . IDA (iron deficiency anemia)   . GERD (gastroesophageal reflux disease)   . Depression   . Peripheral neuropathy   . Urothelial cancer     2010, paillary low-grade, h/o recurrence 2011  . B12 deficiency   . Psoriasis   . 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 Anemia due to multiple mechanisms 05/13/2011  . History of alcohol abuse 05/13/2011  . ANEMIA-IRON DEFICIENCY 03/09/2009  . S/P endoscopy May 2012    4 columns grade II esophageal varices; due for repeat in Nov 2013   . S/P colonoscopy May 2012    Tubular adenoma  . Low back pain   . Cancer     bladder ca 05/2009 removal and  w/chemo wash  . Esophageal varices with bleeding(456.0) 11/24/11    s/p emergent EGD 11/25/11 by Dr. Hilarie Fredrickson at [facility], Grade III esophageal varices s/p banding X 5  . Small bowel obstruction 02/15/2012    Admitted to [facility], managed by Dr. Geroge Baseman, ventral hernia manually reduced  . Ventral hernia 02/15/12  . MRSA (methicillin resistant Staphylococcus aureus)   . Acute post-ligation esophageal ulcer with hemorrhage 04/11/2012  . Clostridium difficile infection   . H. pylori infection 12/24/2012  . Sleep apnea     [facility] took  him off CPAP when had tips procedure  . Sleep apnea   . Cellulitis 04/2014    right upper leg   Past Surgical History  Procedure Laterality Date  . Carpel tunnel Right   . Shoulder surgery Left   . Adrenal mass surgery  05/2009    benign, left  . Bladder surgery  01/2009 and 06/2010    cancer 2010, small recurrence in 06/2010  . Colonoscopy  04/2009    moderate int hemorrhoids, rare sigmoid diverticula, one mm sessile hyperplastic rectal polyp  . Esophagogastroduodenoscopy  03/2009    small hh  . Small bowel capsule endoscopy  03/2009    couple of small benign appearing erosions, nonbleeding  . Egd/tcs  08/2007    small hiatal hernia, pancolonic diverticula, friable anal canal, 3cm salmon colored epithelium in distal esophagus, bx negative for Barrett's  . Skin cancer excision  Oct 2012    left arm  . Esophagogastroduodenoscopy  11/25/2011    Dr. Phineas Douglas III varices in the mid and distal esophagus, banding placed, portal astropathy  . Esophagogastroduodenoscopy  01/14/2012    Dr Rourk->4-5 columns Gr2 varices, 6 bands placed, HH, distal esophageal ulcer, portal gastropathy, antral erosions  . Esophagogastroduodenoscopy  03/17/2012    Dr. Larina Bras varices status post band ligation. Hiatal hernia. Portal gastropathy.  . Umbilical hernia repair  04/05/2012  Procedure: HERNIA REPAIR UMBILICAL ADULT;  Surgeon: Donato Heinz, MD;  Location: [facility];  Service: General;  Laterality: N/A;  . Esophagogastroduodenoscopy  04/11/2012    Dr. Bridgett Larsson ulcer possibly at previous banding sites with stigmata     of bleeding.  Attempt at hemostasis with hemoclip and banding was not successful resulting in recurrence of bleed/  Portal gastropathy, but no evidence of gastric varices/ Recurrent esophageal varices grade 2.  . Esophagogastroduodenoscopy  05/24/2012    Dr. Larina Bras varices, portal gastropathy  . Esophagogastroduodenoscopy  04/11/12    Dr. Drue Novel in the distal  esophagus adherent lot and visible vessel treated with bicap. grade I varices in the distal esophagus, one ligating band placed, portal hypertensive gastropathy in the body of the stomach.  . Colonoscopy  09/29/2012    [id number] varices. Scattered pancolonic diverticula/single cecal polyp-removed as described above  . Esophagogastroduodenoscopy  12/24/2012    Dr. Lynwood Dawley significant upper GI bleed secondary to bleeding esophageal varices. s/p hemostasis therapy with injection of a total of 68mL of 5% ethanolamine and application of 9 bands, complete egd not carried out.  . Schlerotherapy  12/24/2012    Procedure: Woodward Ku OF VARICES;  Surgeon: Daneil Dolin, MD;  Location: [facility];  Service: Endoscopy;  Laterality: N/A;  . Esophageal banding  12/24/2012    [id number] significant upper GI bleed secondary to bleeding esophageal varices  . Tips procedure  12/2012    [facility]  . Cataract extraction, bilateral     Current Outpatient Prescriptions on File Prior to Visit  Medication Sig Dispense Refill  . b complex vitamins tablet Take 1 tablet by mouth daily.      . carvedilol (COREG) 6.25 MG tablet TAKE ONE TABLET BY MOUTH TWICE DAILY WITH A MEAL  60 tablet  0  . furosemide (LASIX) 40 MG tablet Take 40 mg by mouth.      . gabapentin (NEURONTIN) 300 MG capsule TAKE 1 CAPSULE BY MOUTH IN THE MORNING, TAKE 1 CAPSULE BY MOUTH AT NOON, THEN TAKE 3 CAPSULES BY MOUTH AT BEDTIME  150 capsule  5  . glucose blood (ACCU-CHEK AVIVA) test strip 1 each by Other route 3 (three) times daily. Use as instructed  100 each  12  . insulin regular human CONCENTRATED (HUMULIN R) 500 UNIT/ML SOLN injection Inject 35 Units into the skin 3 (three) times daily with meals.       . lactulose (CHRONULAC) 10 GM/15ML solution Take 15CC orally up to three times a day to achieve 2 to 3 soft BMs daily.  1892 mL  3  . levofloxacin (LEVAQUIN) 500 MG tablet Take 1 tablet (500 mg total) by mouth daily.  7 tablet   0  . omeprazole (PRILOSEC) 40 MG capsule Take 1 capsule (40 mg total) by mouth daily.  30 capsule  11  . propranolol (INDERAL) 20 MG tablet Take 1 tablet (20 mg total) by mouth 2 (two) times daily.  60 tablet  11  . rifaximin (XIFAXAN) 550 MG TABS tablet Take 1 tablet (550 mg total) by mouth 2 (two) times daily.  60 tablet  11  . spironolactone (ALDACTONE) 50 MG tablet Take 50 mg by mouth daily.       [name] zolpidem (AMBIEN) 5 MG tablet Take 1-2 tablets (5-10 mg total) by mouth at bedtime as needed for sleep.  60 tablet  2   No current facility-administered medications on file prior to visit.   Allergies  Allergen Reactions  . Aspirin Other (  See Comments)    Harms liver.   . Ibuprofen Other (See Comments)    Can't take per Dr Gala Romney- harms livers  . Tylenol [Acetaminophen] Other (See Comments)    Restless Legs    History   Social History  . Marital Status: Married    Spouse Name: N/A    Number of Children: 3  . Years of Education: N/A   Occupational History  . disabled    Social History Main Topics  . Smoking status: Current Every Day Smoker -- 1.50 packs/day for 30 years    Types: Cigarettes  . Smokeless tobacco: Never Used  . Alcohol Use: No     Comment: drank heavily for few years in 20s  . Drug Use: No  . Sexual Activity: Yes    Birth Control/ Protection: None   Other Topics Concern  . Not on file   Social History Narrative  . No narrative on file      Review of Systems  All other systems reviewed and are negative.      Objective:   Physical Exam  Vitals reviewed. Constitutional: He appears well-developed and well-nourished.  Cardiovascular: Normal rate, regular rhythm and normal heart sounds.   Pulmonary/Chest: Effort normal and breath sounds normal.  Skin: There is erythema.          Assessment & Plan:  Cellulitis and abscess of leg - Plan: doxycycline (VIBRA-TABS) 100 MG tablet  Patient appears to be developing cellulitis. I will start the  patient on doxycycline 100 mg by mouth twice a day for 7 days. I would use doxycycline as opposed to Bactrim given his history of stage II chronic kidney disease. Recheck in 1 week if no better or immediately if worse.

## 2014-08-04 NOTE — Telephone Encounter (Signed)
After multiple attempts with no answer and no return call, message is to be closed.

## 2014-08-17 ENCOUNTER — Encounter (HOSPITAL_COMMUNITY): Payer: Medicaid Other | Attending: Oncology

## 2014-08-17 DIAGNOSIS — D731 Hypersplenism: Secondary | ICD-10-CM

## 2014-08-17 DIAGNOSIS — D649 Anemia, unspecified: Secondary | ICD-10-CM | POA: Diagnosis present

## 2014-08-17 DIAGNOSIS — D509 Iron deficiency anemia, unspecified: Secondary | ICD-10-CM | POA: Insufficient documentation

## 2014-08-17 DIAGNOSIS — I8500 Esophageal varices without bleeding: Secondary | ICD-10-CM | POA: Diagnosis present

## 2014-08-17 DIAGNOSIS — K922 Gastrointestinal hemorrhage, unspecified: Secondary | ICD-10-CM | POA: Diagnosis present

## 2014-08-17 DIAGNOSIS — D518 Other vitamin B12 deficiency anemias: Secondary | ICD-10-CM | POA: Insufficient documentation

## 2014-08-17 DIAGNOSIS — D6959 Other secondary thrombocytopenia: Secondary | ICD-10-CM

## 2014-08-17 DIAGNOSIS — K7460 Unspecified cirrhosis of liver: Secondary | ICD-10-CM | POA: Diagnosis present

## 2014-08-17 LAB — CBC WITH DIFFERENTIAL/PLATELET
Basophils Absolute: 0 10*3/uL (ref 0.0–0.1)
Basophils Relative: 0 % (ref 0–1)
EOS ABS: 0.1 10*3/uL (ref 0.0–0.7)
EOS PCT: 2 % (ref 0–5)
HCT: 44.1 % (ref 39.0–52.0)
Hemoglobin: 15.8 g/dL (ref 13.0–17.0)
LYMPHS ABS: 1.6 10*3/uL (ref 0.7–4.0)
LYMPHS PCT: 25 % (ref 12–46)
MCH: 34.1 pg — ABNORMAL HIGH (ref 26.0–34.0)
MCHC: 35.8 g/dL (ref 30.0–36.0)
MCV: 95 fL (ref 78.0–100.0)
Monocytes Absolute: 0.6 10*3/uL (ref 0.1–1.0)
Monocytes Relative: 9 % (ref 3–12)
NEUTROS PCT: 64 % (ref 43–77)
Neutro Abs: 4.1 10*3/uL (ref 1.7–7.7)
Platelets: 77 10*3/uL — ABNORMAL LOW (ref 150–400)
RBC: 4.64 MIL/uL (ref 4.22–5.81)
RDW: 13.6 % (ref 11.5–15.5)
WBC: 6.5 10*3/uL (ref 4.0–10.5)

## 2014-08-17 LAB — FERRITIN: FERRITIN: 179 ng/mL (ref 22–322)

## 2014-08-17 NOTE — Progress Notes (Signed)
LABS FOR [facility],FERR  

## 2014-08-22 ENCOUNTER — Encounter (HOSPITAL_COMMUNITY): Payer: Self-pay | Admitting: Oncology

## 2014-08-22 ENCOUNTER — Other Ambulatory Visit (HOSPITAL_COMMUNITY): Payer: Self-pay | Admitting: Oncology

## 2014-08-24 ENCOUNTER — Ambulatory Visit (INDEPENDENT_AMBULATORY_CARE_PROVIDER_SITE_OTHER): Payer: Medicaid Other | Admitting: Internal Medicine

## 2014-08-24 ENCOUNTER — Encounter: Payer: Self-pay | Admitting: Internal Medicine

## 2014-08-24 ENCOUNTER — Encounter (INDEPENDENT_AMBULATORY_CARE_PROVIDER_SITE_OTHER): Payer: Self-pay

## 2014-08-24 VITALS — BP 115/73 | HR 68 | Temp 98.2°F | Ht 67.0 in | Wt 225.0 lb

## 2014-08-24 DIAGNOSIS — K429 Umbilical hernia without obstruction or gangrene: Secondary | ICD-10-CM

## 2014-08-24 DIAGNOSIS — K7682 Hepatic encephalopathy: Secondary | ICD-10-CM

## 2014-08-24 DIAGNOSIS — K7290 Hepatic failure, unspecified without coma: Secondary | ICD-10-CM

## 2014-08-24 DIAGNOSIS — K7460 Unspecified cirrhosis of liver: Secondary | ICD-10-CM

## 2014-08-24 DIAGNOSIS — K7581 Nonalcoholic steatohepatitis (NASH): Secondary | ICD-10-CM

## 2014-08-24 NOTE — Patient Instructions (Signed)
Continue Current medical regimen  Warned about driving, operating machinery, etc with history of encephalopathy  Office visit in 3 months

## 2014-08-24 NOTE — Progress Notes (Signed)
Primary Care Physician:  [name], PA-C Primary Gastroenterologist:  Dr. Gala Romney  Pre-Procedure History & Physical: HPI:  Kyle [name] is a 58 y.o. 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 Patient is doing well. His followed by the [facility] transplant clinic. TIPS functioning well on recent Doppler down there. Also had labs-unavailable to me at this time. He's doing very well this point appears to be well compensated. History of a transiently incarcerated umbilical hernia-manually reduced by ED physician. Has not recurred.  Medication regimen includes Inderal, Nexium, Aldactone, Xifaxan lactulose. Has at least 3 bowel movements daily.  Past Medical History  Diagnosis Date  . DM (diabetes mellitus)   . Cirrhosis     bx proven steatohepatitis with cirrhosis (2010); per note from Feb 2011, received Hep A and B vaccines in 2010.  [name] HTN (hypertension)   . RLS (restless legs syndrome)   . Hyperlipidemia   . IDA (iron deficiency anemia)   . GERD (gastroesophageal reflux disease)   . Depression   . Peripheral neuropathy   . Urothelial cancer     2010, paillary low-grade, h/o recurrence 2011  . B12 deficiency   . Psoriasis   . 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 Anemia due to multiple mechanisms 05/13/2011  . History of alcohol abuse 05/13/2011  . ANEMIA-IRON DEFICIENCY 03/09/2009  . S/P endoscopy May 2012    4 columns grade II esophageal varices; due for repeat in Nov 2013   . S/P colonoscopy May 2012    Tubular adenoma  . Low back pain   . Cancer     bladder ca 05/2009 removal and  w/chemo wash  . Esophageal varices with bleeding(456.0) 11/24/11    s/p emergent EGD 11/25/11 by Dr. Hilarie Fredrickson at [facility], Grade III esophageal varices s/p banding X 5  . Small bowel obstruction 02/15/2012    Admitted to [facility], managed by Dr. Geroge Baseman, ventral hernia manually reduced  . Ventral hernia 02/15/12  .  MRSA (methicillin resistant Staphylococcus aureus)   . Acute post-ligation esophageal ulcer with hemorrhage 04/11/2012  . Clostridium difficile infection   . H. pylori infection 12/24/2012  . Sleep apnea     [facility] took him off CPAP when had tips procedure  . Sleep apnea   . Cellulitis 04/2014    right upper leg  . Iron deficiency anemia secondary to blood loss (chronic) 03/09/2009    Secondary to GI blood loss- Rectal and esophageal varices, portal gastropathy, and esophageal ulcers      Past Surgical History  Procedure Laterality Date  . Carpel tunnel Right   . Shoulder surgery Left   . Adrenal mass surgery  05/2009    benign, left  . Bladder surgery  01/2009 and 06/2010    cancer 2010, small recurrence in 06/2010  . Colonoscopy  04/2009    moderate int hemorrhoids, rare sigmoid diverticula, one mm sessile hyperplastic rectal polyp  . Esophagogastroduodenoscopy  03/2009    small hh  . Small bowel capsule endoscopy  03/2009    couple of small benign appearing erosions, nonbleeding  . Egd/tcs  08/2007    small hiatal hernia, pancolonic diverticula, friable anal canal, 3cm salmon colored epithelium in distal esophagus, bx negative for Barrett's  . Skin cancer excision  Oct 2012    left arm  . Esophagogastroduodenoscopy  11/25/2011    Dr. Phineas Douglas III varices in the mid and distal esophagus, banding placed, portal astropathy  .  Esophagogastroduodenoscopy  01/14/2012    Dr Jermey Closs->4-5 columns Gr2 varices, 6 bands placed, HH, distal esophageal ulcer, portal gastropathy, antral erosions  . Esophagogastroduodenoscopy  03/17/2012    Dr. Larina Bras varices status post band ligation. Hiatal hernia. Portal gastropathy.  . Umbilical hernia repair  04/05/2012    Procedure: HERNIA REPAIR UMBILICAL ADULT;  Surgeon: Donato Heinz, MD;  Location: [facility];  Service: General;  Laterality: N/A;  . Esophagogastroduodenoscopy  04/11/2012    Dr. Bridgett Larsson ulcer possibly at previous banding sites with  stigmata     of bleeding.  Attempt at hemostasis with hemoclip and banding was not successful resulting in recurrence of bleed/  Portal gastropathy, but no evidence of gastric varices/ Recurrent esophageal varices grade 2.  . Esophagogastroduodenoscopy  05/24/2012    Dr. Larina Bras varices, portal gastropathy  . Esophagogastroduodenoscopy  04/11/12    Dr. Drue Novel in the distal esophagus adherent lot and visible vessel treated with bicap. grade I varices in the distal esophagus, one ligating band placed, portal hypertensive gastropathy in the body of the stomach.  . Colonoscopy  09/29/2012    IFO:YDXAJO varices. Scattered pancolonic diverticula/single cecal polyp-removed as described above  . Esophagogastroduodenoscopy  12/24/2012    Dr. Lynwood Dawley significant upper GI bleed secondary to bleeding esophageal varices. s/p hemostasis therapy with injection of a total of 16mL of 5% ethanolamine and application of 9 bands, complete egd not carried out.  . Schlerotherapy  12/24/2012    Procedure: Woodward Ku OF VARICES;  Surgeon: Kyle Dolin, MD;  Location: [facility];  Service: Endoscopy;  Laterality: N/A;  . Esophageal banding  12/24/2012    INO:[id number] significant upper GI bleed secondary to bleeding esophageal varices  . Tips procedure  12/2012    [facility]  . Cataract extraction, bilateral      Prior to Admission medications   Medication Sig Start Date End Date Taking? Authorizing Provider  b complex vitamins tablet Take 1 tablet by mouth daily.   Yes Historical Provider, MD  carvedilol (COREG) 6.25 MG tablet TAKE ONE TABLET BY MOUTH TWICE DAILY WITH A MEAL   Yes Mary B Dixon, PA-C  doxycycline (VIBRA-TABS) 100 MG tablet Take 1 tablet (100 mg total) by mouth 2 (two) times daily. 08/03/14  Yes Susy Frizzle, MD  furosemide (LASIX) 40 MG tablet Take 40 mg by mouth. 03/03/14  Yes Historical Provider, MD  gabapentin (NEURONTIN) 300 MG capsule TAKE 1 CAPSULE BY MOUTH IN THE  MORNING, TAKE 1 CAPSULE BY MOUTH AT NOON, THEN TAKE 3 CAPSULES BY MOUTH AT BEDTIME   Yes Mary B Dixon, PA-C  glucose blood (ACCU-CHEK AVIVA) test strip 1 each by Other route 3 (three) times daily. Use as instructed 05/23/14  Yes Orlena Sheldon, PA-C  insulin regular human CONCENTRATED (HUMULIN R) 500 UNIT/ML SOLN injection Inject 35 Units into the skin 3 (three) times daily with meals.    Yes Historical Provider, MD  lactulose (CHRONULAC) 10 GM/15ML solution Take 15CC orally up to three times a day to achieve 2 to 3 soft BMs daily. 08/01/14  Yes Mahala Menghini, PA-C  omeprazole (PRILOSEC) 40 MG capsule Take 1 capsule (40 mg total) by mouth daily. 12/22/13  Yes Orvil Feil, NP  propranolol (INDERAL) 20 MG tablet Take 1 tablet (20 mg total) by mouth 2 (two) times daily. 04/27/14  Yes Kyle Dolin, MD  rifaximin (XIFAXAN) 550 MG TABS tablet Take 1 tablet (550 mg total) by mouth 2 (two) times daily. 08/01/13  Yes Magda Paganini  S Lewis, PA-C  spironolactone (ALDACTONE) 50 MG tablet Take 50 mg by mouth daily.  03/03/14 03/03/15 Yes Historical Provider, MD  zolpidem (AMBIEN) 5 MG tablet Take 1-2 tablets (5-10 mg total) by mouth at bedtime as needed for sleep. 03/07/14  Yes Manon Hilding Kefalas, PA-C  levofloxacin (LEVAQUIN) 500 MG tablet Take 1 tablet (500 mg total) by mouth daily. 07/07/14   Susy Frizzle, MD    Allergies as of 08/24/2014 - Review Complete 08/24/2014  Allergen Reaction Noted  . Aspirin Other (See Comments) 12/23/2012  . Ibuprofen Other (See Comments) 05/13/2012  . Tylenol [acetaminophen] Other (See Comments) 07/17/2011    Family History  Problem Relation Age of Onset  . Cirrhosis Father     etoh  . Colon cancer Neg Hx   . Anesthesia problems Neg Hx   . Hypotension Neg Hx   . Malignant hyperthermia Neg Hx   . Pseudochol deficiency Neg Hx   . Kidney cancer Mother   . Cancer Mother   . HIV Brother   . Cirrhosis Brother     nash    History   Social History  . Marital Status: Married     Spouse Name: N/A    Number of Children: 3  . Years of Education: N/A   Occupational History  . disabled    Social History Main Topics  . Smoking status: Current Every Day Smoker -- 1.50 packs/day for 30 years    Types: Cigarettes  . Smokeless tobacco: Never Used  . Alcohol Use: No     Comment: drank heavily for few years in 20s  . Drug Use: No  . Sexual Activity: Yes    Birth Control/ Protection: None   Other Topics Concern  . Not on file   Social History Narrative  . No narrative on file    Review of Systems: See HPI, otherwise negative ROS  Physical Exam: BP 115/73  Pulse 68  Temp(Src) 98.2 F (36.8 C) (Oral)  Ht 5\' 7"  (1.702 m)  Wt 225 lb (102.059 kg)  BMI 35.23 kg/m2 General:   Alert,  Well-developed, well-nourished, pleasant and cooperative in NAD. A flat. Eyes:  Sclera clear, no icterus.   Conjunctiva pink. Ears:  Normal auditory acuity. Nose:  No deformity, discharge,  or lesions. Mouth:  No deformity or lesions. Neck:  Supple; no masses or thyromegaly. No significant cervical adenopathy. Lungs:  Clear throughout to auscultation.   No wheezes, crackles, or rhonchi. No acute distress. Heart:  Regular rate and rhythm; no murmurs, clicks, rubs,  or gallops. Abdomen: Obese. Positive bowel sounds. No obvious fluid wave or shifting dullness. Easily reducible umbilical hernia. Liver palpable below the right costal margin. Spleen palpable. Pulses:  Normal pulses noted. Extremities: Trace edema  Impression:  Pleasant 58 year old gentleman with Karlene Lineman cirrhosis with complications as outlined above doing remarkably well at this time. Labs pending from the [facility]. No significant encephalopathy detected today. However, he was again admonished about not driving or operating machinery.  Recommendations:  Continue Current medical regimen  Warned about driving, operating machinery, etc with history of encephalopathy  Review [facility] labs as  they become available.  Office visit in 3 months     Notice: This dictation was prepared with Dragon dictation along with smaller phrase technology. Any transcriptional errors that result from this process are unintentional and may not be corrected upon review.

## 2014-08-25 ENCOUNTER — Other Ambulatory Visit: Payer: Self-pay | Admitting: Gastroenterology

## 2014-08-30 ENCOUNTER — Telehealth: Payer: Self-pay

## 2014-08-30 NOTE — Telephone Encounter (Signed)
Pt called and stated medicaid was not paying for his xifaxan anymore. I did a Prior authorization for it and got it approved. I have faxed the response to [facility]. Tried to call pt- he was not home. Left message for him to call back.

## 2014-10-04 ENCOUNTER — Encounter (HOSPITAL_COMMUNITY): Payer: Medicaid Other | Attending: Oncology

## 2014-10-04 DIAGNOSIS — D509 Iron deficiency anemia, unspecified: Secondary | ICD-10-CM

## 2014-10-04 DIAGNOSIS — D518 Other vitamin B12 deficiency anemias: Secondary | ICD-10-CM | POA: Diagnosis present

## 2014-10-04 DIAGNOSIS — D731 Hypersplenism: Secondary | ICD-10-CM

## 2014-10-04 DIAGNOSIS — D6959 Other secondary thrombocytopenia: Secondary | ICD-10-CM | POA: Diagnosis present

## 2014-10-04 DIAGNOSIS — B3742 Candidal balanitis: Secondary | ICD-10-CM

## 2014-10-04 DIAGNOSIS — K7460 Unspecified cirrhosis of liver: Secondary | ICD-10-CM

## 2014-10-04 LAB — CBC WITH DIFFERENTIAL/PLATELET
BASOS ABS: 0 10*3/uL (ref 0.0–0.1)
BASOS PCT: 0 % (ref 0–1)
Eosinophils Absolute: 0.1 10*3/uL (ref 0.0–0.7)
Eosinophils Relative: 2 % (ref 0–5)
HEMATOCRIT: 41.7 % (ref 39.0–52.0)
HEMOGLOBIN: 14.6 g/dL (ref 13.0–17.0)
LYMPHS PCT: 28 % (ref 12–46)
Lymphs Abs: 1.7 10*3/uL (ref 0.7–4.0)
MCH: 32.7 pg (ref 26.0–34.0)
MCHC: 35 g/dL (ref 30.0–36.0)
MCV: 93.3 fL (ref 78.0–100.0)
MONO ABS: 0.6 10*3/uL (ref 0.1–1.0)
Monocytes Relative: 10 % (ref 3–12)
NEUTROS PCT: 60 % (ref 43–77)
Neutro Abs: 3.7 10*3/uL (ref 1.7–7.7)
Platelets: 73 10*3/uL — ABNORMAL LOW (ref 150–400)
RBC: 4.47 MIL/uL (ref 4.22–5.81)
RDW: 14 % (ref 11.5–15.5)
WBC: 6.1 10*3/uL (ref 4.0–10.5)

## 2014-10-04 LAB — FERRITIN: Ferritin: 104 ng/mL (ref 22–322)

## 2014-10-04 MED ORDER — FLUCONAZOLE 100 MG PO TABS
100.0000 mg | ORAL_TABLET | Freq: Every day | ORAL | Status: AC
Start: 2014-10-04 — End: 2014-10-11

## 2014-10-04 NOTE — Progress Notes (Signed)
Labs for cbcd,ferr 

## 2014-10-05 ENCOUNTER — Other Ambulatory Visit (HOSPITAL_COMMUNITY): Payer: Self-pay | Admitting: Oncology

## 2014-10-05 DIAGNOSIS — D500 Iron deficiency anemia secondary to blood loss (chronic): Secondary | ICD-10-CM

## 2014-10-06 ENCOUNTER — Encounter (HOSPITAL_BASED_OUTPATIENT_CLINIC_OR_DEPARTMENT_OTHER): Payer: Medicaid Other

## 2014-10-06 ENCOUNTER — Encounter (HOSPITAL_COMMUNITY): Payer: Self-pay

## 2014-10-06 DIAGNOSIS — D509 Iron deficiency anemia, unspecified: Secondary | ICD-10-CM

## 2014-10-06 MED ORDER — SODIUM CHLORIDE 0.9 % IV SOLN
Freq: Once | INTRAVENOUS | Status: AC
  Administered 2014-10-06: 09:00:00 via INTRAVENOUS

## 2014-10-06 MED ORDER — SODIUM CHLORIDE 0.9 % IV SOLN
1020.0000 mg | Freq: Once | INTRAVENOUS | Status: AC
  Administered 2014-10-06: 1020 mg via INTRAVENOUS
  Filled 2014-10-06: qty 34

## 2014-10-06 NOTE — Patient Instructions (Signed)
[facility] Discharge Instructions  RECOMMENDATIONS MADE BY THE CONSULTANT AND ANY TEST RESULTS WILL BE SENT TO YOUR REFERRING PHYSICIAN.  You had an iron infusion today.  Please call to schedule the next iron infusion within a week.  Call the clinic if you have any questions or concerns.  Thank you for choosing [facility] to provide your oncology and hematology care.  To afford each patient quality time with our providers, please arrive at least 15 minutes before your scheduled appointment time.  With your help, our goal is to use those 15 minutes to complete the necessary work-up to ensure our physicians have the information they need to help with your evaluation and healthcare recommendations.    Effective January 1st, 2014, we ask that you re-schedule your appointment with our physicians should you arrive 10 or more minutes late for your appointment.  We strive to give you quality time with our providers, and arriving late affects you and other patients whose appointments are after yours.    Again, thank you for choosing [facility].  Our hope is that these requests will decrease the amount of time that you wait before being seen by our physicians.       _____________________________________________________________  Should you have questions after your visit to [facility], please contact our office at [phone] between the hours of 8:30 a.m. and 5:00 p.m.  Voicemails left after 4:30 p.m. will not be returned until the following business day.  For prescription refill requests, have your pharmacy contact our office with your prescription refill request.

## 2014-10-06 NOTE — Progress Notes (Signed)
Tolerated infusion w/o adverse reaction.  

## 2014-10-13 ENCOUNTER — Encounter (HOSPITAL_COMMUNITY): Payer: Self-pay

## 2014-10-13 ENCOUNTER — Encounter (HOSPITAL_BASED_OUTPATIENT_CLINIC_OR_DEPARTMENT_OTHER): Payer: Medicaid Other

## 2014-10-13 DIAGNOSIS — D509 Iron deficiency anemia, unspecified: Secondary | ICD-10-CM

## 2014-10-13 MED ORDER — SODIUM CHLORIDE 0.9 % IJ SOLN: 10.0000 mL | INTRAMUSCULAR | Status: DC | PRN

## 2014-10-13 MED ORDER — SODIUM CHLORIDE 0.9 % IV SOLN
Freq: Once | INTRAVENOUS | Status: AC
  Administered 2014-10-13: 10:00:00 via INTRAVENOUS

## 2014-10-13 MED ORDER — SODIUM CHLORIDE 0.9 % IV SOLN
510.0000 mg | Freq: Once | INTRAVENOUS | Status: AC
  Administered 2014-10-13: 510 mg via INTRAVENOUS
  Filled 2014-10-13: qty 17

## 2014-10-13 NOTE — Patient Instructions (Signed)
[facility] Discharge Instructions  RECOMMENDATIONS MADE BY THE CONSULTANT AND ANY TEST RESULTS WILL BE SENT TO YOUR REFERRING PHYSICIAN.  Today you were given Feraheme infusion. Please follow up as scheduled. Call for any questions or concerns.   Thank you for choosing [facility] to provide your oncology and hematology care.  To afford each patient quality time with our providers, please arrive at least 15 minutes before your scheduled appointment time.  With your help, our goal is to use those 15 minutes to complete the necessary work-up to ensure our physicians have the information they need to help with your evaluation and healthcare recommendations.    Effective January 1st, 2014, we ask that you re-schedule your appointment with our physicians should you arrive 10 or more minutes late for your appointment.  We strive to give you quality time with our providers, and arriving late affects you and other patients whose appointments are after yours.    Again, thank you for choosing [facility].  Our hope is that these requests will decrease the amount of time that you wait before being seen by our physicians.       _____________________________________________________________  Should you have questions after your visit to [facility], please contact our office at [phone] between the hours of 8:30 a.m. and 4:30 p.m.  Voicemails left after 4:30 p.m. will not be returned until the following business day.  For prescription refill requests, have your pharmacy contact our office with your prescription refill request.    _______________________________________________________________  We hope that we have given you very good care.  You may receive a patient satisfaction survey in the mail, please complete it and return it as soon as possible.  We value your  feedback!  _______________________________________________________________  Have you asked about our STAR program?  STAR stands for Survivorship Training and Rehabilitation, and this is a nationally recognized cancer care program that focuses on survivorship and rehabilitation.  Cancer and cancer treatments may cause problems, such as, pain, making you feel tired and keeping you from doing the things that you need or want to do. Cancer rehabilitation can help. Our goal is to reduce these troubling effects and help you have the best quality of life possible.  You may receive a survey from a nurse that asks questions about your current state of health.  Based on the survey results, all eligible patients will be referred to the [facility] program for an evaluation so we can better serve you!  A frequently asked questions sheet is available upon request.

## 2014-10-13 NOTE — Progress Notes (Signed)
Patient tolerated infusion well.

## 2014-11-04 ENCOUNTER — Emergency Department (HOSPITAL_COMMUNITY): Payer: Medicaid Other

## 2014-11-04 ENCOUNTER — Encounter (HOSPITAL_COMMUNITY): Payer: Self-pay

## 2014-11-04 ENCOUNTER — Emergency Department (HOSPITAL_COMMUNITY)
Admission: EM | Admit: 2014-11-04 | Discharge: 2014-11-04 | Disposition: A | Discharge status: Home / Self Care | Payer: Medicaid Other | Attending: Emergency Medicine | Admitting: Emergency Medicine

## 2014-11-04 DIAGNOSIS — I872 Venous insufficiency (chronic) (peripheral): Secondary | ICD-10-CM | POA: Diagnosis not present

## 2014-11-04 DIAGNOSIS — Z872 Personal history of diseases of the skin and subcutaneous tissue: Secondary | ICD-10-CM | POA: Insufficient documentation

## 2014-11-04 DIAGNOSIS — E119 Type 2 diabetes mellitus without complications: Secondary | ICD-10-CM | POA: Diagnosis not present

## 2014-11-04 DIAGNOSIS — Z8619 Personal history of other infectious and parasitic diseases: Secondary | ICD-10-CM | POA: Diagnosis not present

## 2014-11-04 DIAGNOSIS — G4730 Sleep apnea, unspecified: Secondary | ICD-10-CM | POA: Diagnosis not present

## 2014-11-04 DIAGNOSIS — Z9889 Other specified postprocedural states: Secondary | ICD-10-CM | POA: Diagnosis not present

## 2014-11-04 DIAGNOSIS — Z79899 Other long term (current) drug therapy: Secondary | ICD-10-CM | POA: Diagnosis not present

## 2014-11-04 DIAGNOSIS — R2241 Localized swelling, mass and lump, right lower limb: Secondary | ICD-10-CM | POA: Diagnosis present

## 2014-11-04 DIAGNOSIS — Z9981 Dependence on supplemental oxygen: Secondary | ICD-10-CM | POA: Insufficient documentation

## 2014-11-04 DIAGNOSIS — R0602 Shortness of breath: Secondary | ICD-10-CM

## 2014-11-04 DIAGNOSIS — E538 Deficiency of other specified B group vitamins: Secondary | ICD-10-CM | POA: Diagnosis not present

## 2014-11-04 DIAGNOSIS — Z720 Tobacco use: Secondary | ICD-10-CM | POA: Diagnosis not present

## 2014-11-04 DIAGNOSIS — F329 Major depressive disorder, single episode, unspecified: Secondary | ICD-10-CM | POA: Diagnosis not present

## 2014-11-04 DIAGNOSIS — Z8551 Personal history of malignant neoplasm of bladder: Secondary | ICD-10-CM | POA: Diagnosis not present

## 2014-11-04 DIAGNOSIS — Z794 Long term (current) use of insulin: Secondary | ICD-10-CM | POA: Insufficient documentation

## 2014-11-04 DIAGNOSIS — K219 Gastro-esophageal reflux disease without esophagitis: Secondary | ICD-10-CM | POA: Insufficient documentation

## 2014-11-04 DIAGNOSIS — Z862 Personal history of diseases of the blood and blood-forming organs and certain disorders involving the immune mechanism: Secondary | ICD-10-CM | POA: Insufficient documentation

## 2014-11-04 DIAGNOSIS — Z792 Long term (current) use of antibiotics: Secondary | ICD-10-CM | POA: Diagnosis not present

## 2014-11-04 DIAGNOSIS — Z8614 Personal history of Methicillin resistant Staphylococcus aureus infection: Secondary | ICD-10-CM | POA: Insufficient documentation

## 2014-11-04 DIAGNOSIS — I10 Essential (primary) hypertension: Secondary | ICD-10-CM | POA: Insufficient documentation

## 2014-11-04 LAB — TROPONIN I

## 2014-11-04 LAB — COMPREHENSIVE METABOLIC PANEL
ALBUMIN: 2.4 g/dL — AB (ref 3.5–5.2)
ALT: 33 U/L (ref 0–53)
AST: 55 U/L — ABNORMAL HIGH (ref 0–37)
Alkaline Phosphatase: 164 U/L — ABNORMAL HIGH (ref 39–117)
Anion gap: 15 (ref 5–15)
BUN: 13 mg/dL (ref 6–23)
CALCIUM: 9.2 mg/dL (ref 8.4–10.5)
CO2: 21 mEq/L (ref 19–32)
CREATININE: 1.27 mg/dL (ref 0.50–1.35)
Chloride: 97 mEq/L (ref 96–112)
GFR calc non Af Amer: 61 mL/min — ABNORMAL LOW (ref >90)
GFR, EST AFRICAN AMERICAN: 71 mL/min — AB (ref >90)
GLUCOSE: 347 mg/dL — AB (ref 70–99)
Potassium: 4.4 mEq/L (ref 3.7–5.3)
Sodium: 133 mEq/L — ABNORMAL LOW (ref 137–147)
TOTAL PROTEIN: 7.4 g/dL (ref 6.0–8.3)
Total Bilirubin: 3.4 mg/dL — ABNORMAL HIGH (ref 0.3–1.2)

## 2014-11-04 LAB — CBC
HEMATOCRIT: 41.7 % (ref 39.0–52.0)
HEMOGLOBIN: 14.3 g/dL (ref 13.0–17.0)
MCH: 33.6 pg (ref 26.0–34.0)
MCHC: 34.3 g/dL (ref 30.0–36.0)
MCV: 97.9 fL (ref 78.0–100.0)
Platelets: 48 10*3/uL — ABNORMAL LOW (ref 150–400)
RBC: 4.26 MIL/uL (ref 4.22–5.81)
RDW: 16.8 % — AB (ref 11.5–15.5)
WBC: 4.2 10*3/uL (ref 4.0–10.5)

## 2014-11-04 LAB — PRO B NATRIURETIC PEPTIDE: Pro B Natriuretic peptide (BNP): 158.8 pg/mL — ABNORMAL HIGH (ref 0–125)

## 2014-11-04 MED ORDER — OXYCODONE HCL 5 MG PO TABS
10.0000 mg | ORAL_TABLET | Freq: Once | ORAL | Status: AC
  Administered 2014-11-04: 10 mg via ORAL
  Filled 2014-11-04: qty 2

## 2014-11-04 NOTE — Discharge Instructions (Signed)

## 2014-11-04 NOTE — ED Notes (Signed)
I have a bad cough, but the reason I came in is because my ankles and feet are swelling really bad per pt. The feet are hurting real bad per pt. Patient takes medication for fluid retention, but he states that the swelling has never been this bad.

## 2014-11-04 NOTE — ED Provider Notes (Signed)
CSN: [id number]     Arrival date & time 11/04/14  1919 History   First MD Initiated Contact with Patient 11/04/14 2015     Chief Complaint  Patient presents with  . Leg Swelling     ) HPI Patient ports cough over the past several days but reports these had increased swelling of his bilateral ankle feet which is worsened ever had before.  He does have a history of swelling but he states this never been this bad before.  He denies shortness of breath.  He denies significant orthopnea but reports the cough is new.  It is nonproductive.  No fevers or chills.  No history congestive heart failure.  He does have a history of cirrhosis.  He is compliant with his medications.  He has primary care physicians and GI team.  No history congestive heart failure.  No recent chest pain or shortness of breath.   Past Medical History  Diagnosis Date  . DM (diabetes mellitus)   . Cirrhosis     bx proven steatohepatitis with cirrhosis (2010); per note from Feb 2011, received Hep A and B vaccines in 2010.  [name] HTN (hypertension)   . RLS (restless legs syndrome)   . Hyperlipidemia   . IDA (iron deficiency anemia)   . GERD (gastroesophageal reflux disease)   . Depression   . Peripheral neuropathy   . Urothelial cancer     2010, paillary low-grade, h/o recurrence 2011  . B12 deficiency   . Psoriasis   . 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 Anemia due to multiple mechanisms 05/13/2011  . History of alcohol abuse 05/13/2011  . ANEMIA-IRON DEFICIENCY 03/09/2009  . S/P endoscopy May 2012    4 columns grade II esophageal varices; due for repeat in Nov 2013   . S/P colonoscopy May 2012    Tubular adenoma  . Low back pain   . Cancer     bladder ca 05/2009 removal and  w/chemo wash  . Esophageal varices with bleeding(456.0) 11/24/11    s/p emergent EGD 11/25/11 by Dr. Hilarie Fredrickson at [facility], Grade III esophageal varices s/p banding X 5  . Small bowel obstruction 02/15/2012    Admitted to [facility], managed by Dr.  Geroge Baseman, ventral hernia manually reduced  . Ventral hernia 02/15/12  . MRSA (methicillin resistant Staphylococcus aureus)   . Acute post-ligation esophageal ulcer with hemorrhage 04/11/2012  . Clostridium difficile infection   . H. pylori infection 12/24/2012  . Sleep apnea     [facility] took him off CPAP when had tips procedure  . Sleep apnea   . Cellulitis 04/2014    right upper leg  . Iron deficiency anemia secondary to blood loss (chronic) 03/09/2009    Secondary to GI blood loss- Rectal and esophageal varices, portal gastropathy, and esophageal ulcers     Past Surgical History  Procedure Laterality Date  . Carpel tunnel Right   . Shoulder surgery Left   . Adrenal mass surgery  05/2009    benign, left  . Bladder surgery  01/2009 and 06/2010    cancer 2010, small recurrence in 06/2010  . Colonoscopy  04/2009    moderate int hemorrhoids, rare sigmoid diverticula, one mm sessile hyperplastic rectal polyp  . Esophagogastroduodenoscopy  03/2009    small hh  . Small bowel capsule endoscopy  03/2009    couple of small benign appearing erosions, nonbleeding  . Egd/tcs  08/2007    small hiatal hernia, pancolonic diverticula, friable anal canal, 3cm salmon  colored epithelium in distal esophagus, bx negative for Barrett's  . Skin cancer excision  Oct 2012    left arm  . Esophagogastroduodenoscopy  11/25/2011    Dr. Phineas Douglas III varices in the mid and distal esophagus, banding placed, portal astropathy  . Esophagogastroduodenoscopy  01/14/2012    Dr Rourk->4-5 columns Gr2 varices, 6 bands placed, HH, distal esophageal ulcer, portal gastropathy, antral erosions  . Esophagogastroduodenoscopy  03/17/2012    Dr. Larina Bras varices status post band ligation. Hiatal hernia. Portal gastropathy.  . Umbilical hernia repair  04/05/2012    Procedure: HERNIA REPAIR UMBILICAL ADULT;  Surgeon: Donato Heinz, MD;  Location: [facility];  Service: General;  Laterality: N/A;  . Esophagogastroduodenoscopy  04/11/2012     Dr. Bridgett Larsson ulcer possibly at previous banding sites with stigmata     of bleeding.  Attempt at hemostasis with hemoclip and banding was not successful resulting in recurrence of bleed/  Portal gastropathy, but no evidence of gastric varices/ Recurrent esophageal varices grade 2.  . Esophagogastroduodenoscopy  05/24/2012    Dr. Larina Bras varices, portal gastropathy  . Esophagogastroduodenoscopy  04/11/12    Dr. Drue Novel in the distal esophagus adherent lot and visible vessel treated with bicap. grade I varices in the distal esophagus, one ligating band placed, portal hypertensive gastropathy in the body of the stomach.  . Colonoscopy  09/29/2012    GYJ:EHUDJS varices. Scattered pancolonic diverticula/single cecal polyp-removed as described above  . Esophagogastroduodenoscopy  12/24/2012    Dr. Lynwood Dawley significant upper GI bleed secondary to bleeding esophageal varices. s/p hemostasis therapy with injection of a total of 65mL of 5% ethanolamine and application of 9 bands, complete egd not carried out.  . Schlerotherapy  12/24/2012    Procedure: Woodward Ku OF VARICES;  Surgeon: Daneil Dolin, MD;  Location: [facility];  Service: Endoscopy;  Laterality: N/A;  . Esophageal banding  12/24/2012    HFW:[id number] significant upper GI bleed secondary to bleeding esophageal varices  . Tips procedure  12/2012    [facility]  . Cataract extraction, bilateral     Family History  Problem Relation Age of Onset  . Cirrhosis Father     etoh  . Colon cancer Neg Hx   . Anesthesia problems Neg Hx   . Hypotension Neg Hx   . Malignant hyperthermia Neg Hx   . Pseudochol deficiency Neg Hx   . Kidney cancer Mother   . Cancer Mother   . HIV Brother   . Cirrhosis Brother     nash   History  Substance Use Topics  . Smoking status: Current Every Day Smoker -- 1.50 packs/day for 30 years    Types: Cigarettes  . Smokeless tobacco: Never Used  . Alcohol Use: No     Comment:  drank heavily for few years in 20s    Review of Systems  All other systems reviewed and are negative.     Allergies  Aspirin; Ibuprofen; and Tylenol  Home Medications   Prior to Admission medications   Medication Sig Start Date End Date Taking? Authorizing Provider  b complex vitamins tablet Take 1 tablet by mouth daily.    Historical Provider, MD  carvedilol (COREG) 6.25 MG tablet TAKE ONE TABLET BY MOUTH TWICE DAILY WITH A MEAL    Mary B Dixon, PA-C  doxycycline (VIBRA-TABS) 100 MG tablet Take 1 tablet (100 mg total) by mouth 2 (two) times daily. 08/03/14   Susy Frizzle, MD  furosemide (LASIX) 40 MG tablet Take 40 mg by  mouth. 03/03/14   Historical Provider, MD  gabapentin (NEURONTIN) 300 MG capsule TAKE 1 CAPSULE BY MOUTH IN THE MORNING, TAKE 1 CAPSULE BY MOUTH AT NOON, THEN TAKE 3 CAPSULES BY MOUTH AT BEDTIME    Mary B Dixon, PA-C  glucose blood (ACCU-CHEK AVIVA) test strip 1 each by Other route 3 (three) times daily. Use as instructed 05/23/14   Orlena Sheldon, PA-C  insulin regular human CONCENTRATED (HUMULIN R) 500 UNIT/ML SOLN injection Inject 35 Units into the skin 3 (three) times daily with meals.     Historical Provider, MD  lactulose (CHRONULAC) 10 GM/15ML solution Take 15CC orally up to three times a day to achieve 2 to 3 soft BMs daily. 08/01/14   Mahala Menghini, PA-C  levofloxacin (LEVAQUIN) 500 MG tablet Take 1 tablet (500 mg total) by mouth daily. 07/07/14   Susy Frizzle, MD  omeprazole (PRILOSEC) 40 MG capsule Take 1 capsule (40 mg total) by mouth daily. 12/22/13   Orvil Feil, NP  propranolol (INDERAL) 20 MG tablet Take 1 tablet (20 mg total) by mouth 2 (two) times daily. 04/27/14   Daneil Dolin, MD  spironolactone (ALDACTONE) 50 MG tablet Take 50 mg by mouth daily.  03/03/14 03/03/15  Historical Provider, MD  XIFAXAN 550 MG TABS tablet TAKE ONE TABLET BY MOUTH TWICE DAILY 08/28/14   Orvil Feil, NP  zolpidem (AMBIEN) 5 MG tablet Take 1-2 tablets (5-10 mg total) by mouth at  bedtime as needed for sleep. 03/07/14   Manon Hilding Kefalas, PA-C   BP 106/55 mmHg  Pulse 74  Temp(Src) 98.1 F (36.7 C) (Oral)  Resp 16  Ht 5\' 7"  (1.702 m)  Wt 258 lb (117.028 kg)  BMI 40.40 kg/m2  SpO2 96% Physical Exam  Constitutional: He is oriented to person, place, and time. He appears well-developed and well-nourished.  HENT:  Head: Normocephalic and atraumatic.  Eyes: EOM are normal.  Neck: Normal range of motion.  Cardiovascular: Normal rate, regular rhythm, normal heart sounds and intact distal pulses.   Pulmonary/Chest: Effort normal and breath sounds normal. No respiratory distress. He has no wheezes. He has no rales.  Abdominal: Soft. He exhibits no distension. There is no tenderness.  Musculoskeletal: Normal range of motion.  1-2+ edema bilaterally.  Normal PT and DP pulses bilaterally.  Erythema of his legs.  Neurological: He is alert and oriented to person, place, and time.  Skin: Skin is warm and dry.  Psychiatric: He has a normal mood and affect. Judgment normal.  Nursing note and vitals reviewed.   ED Course  Procedures (including critical care time) Labs Review Labs Reviewed  CBC - Abnormal; Notable for the following:    RDW 16.8 (*)    Platelets 48 (*)    All other components within normal limits  COMPREHENSIVE METABOLIC PANEL - Abnormal; Notable for the following:    Sodium 133 (*)    Glucose, Bld 347 (*)    Albumin 2.4 (*)    AST 55 (*)    Alkaline Phosphatase 164 (*)    Total Bilirubin 3.4 (*)    GFR calc non Af Amer 61 (*)    GFR calc Af Amer 71 (*)    All other components within normal limits  PRO B NATRIURETIC PEPTIDE - Abnormal; Notable for the following:    Pro B Natriuretic peptide (BNP) 158.8 (*)    All other components within normal limits  TROPONIN I    Imaging Review Dg Chest 2  View  11/04/2014   CLINICAL DATA:  Shortness of breath. Bilateral lower extremity swelling today. History of bladder cancer, diabetes, GERD, cirrhosis,  hypertension, and smoking.  EXAM: CHEST  2 VIEW  COMPARISON:  07/07/2014  FINDINGS: The heart is mildly enlarged. There are perihilar and interstitial of mild pulmonary edema. Pulmonary vascular congestion is present. No overt alveolar edema or pleural effusions.  IMPRESSION: Mild pulmonary edema.   Electronically Signed   By: Shon Hale M.D.   On: 11/04/2014 21:17  I personally reviewed the imaging tests through PACS system I reviewed available ER/hospitalization records through the EMR    EKG Interpretation None      MDM   Final diagnoses:  Shortness of breath    I suspect more of his leg swelling is venous insufficiency.  His chest x-ray was read out as mild pulmonary edema.  Overall his chest x-rays not very impressive.  He has no history of congestive heart failure.  I suspect that his lower extremities are more venous insufficiency.  I recommended salt restriction, elevation, ongoing Lasix.  I've also recommended compression stockings.  EKG is normal.  BNP is 158.  I do not believe this to be congestive heart failure.  Discharge home in good condition.  PCP follow-up    Hoy Morn, MD 11/04/14 2252

## 2014-11-07 ENCOUNTER — Encounter: Payer: Self-pay | Admitting: Physician Assistant

## 2014-11-08 ENCOUNTER — Encounter (HOSPITAL_COMMUNITY): Payer: Self-pay

## 2014-11-08 ENCOUNTER — Encounter (HOSPITAL_BASED_OUTPATIENT_CLINIC_OR_DEPARTMENT_OTHER): Payer: Medicaid Other

## 2014-11-08 ENCOUNTER — Encounter (HOSPITAL_COMMUNITY): Payer: Medicaid Other | Attending: Hematology and Oncology

## 2014-11-08 VITALS — BP 115/71 | HR 79 | Temp 98.1°F | Resp 20 | Wt 274.0 lb

## 2014-11-08 DIAGNOSIS — K7581 Nonalcoholic steatohepatitis (NASH): Secondary | ICD-10-CM | POA: Insufficient documentation

## 2014-11-08 DIAGNOSIS — D6959 Other secondary thrombocytopenia: Secondary | ICD-10-CM

## 2014-11-08 DIAGNOSIS — K7460 Unspecified cirrhosis of liver: Secondary | ICD-10-CM

## 2014-11-08 DIAGNOSIS — D509 Iron deficiency anemia, unspecified: Secondary | ICD-10-CM | POA: Diagnosis not present

## 2014-11-08 DIAGNOSIS — I8500 Esophageal varices without bleeding: Secondary | ICD-10-CM | POA: Diagnosis not present

## 2014-11-08 DIAGNOSIS — D731 Hypersplenism: Secondary | ICD-10-CM

## 2014-11-08 LAB — CBC WITH DIFFERENTIAL/PLATELET
BASOS ABS: 0 10*3/uL (ref 0.0–0.1)
Basophils Relative: 0 % (ref 0–1)
EOS ABS: 0.1 10*3/uL (ref 0.0–0.7)
EOS PCT: 1 % (ref 0–5)
HCT: 44.1 % (ref 39.0–52.0)
Hemoglobin: 14.7 g/dL (ref 13.0–17.0)
LYMPHS ABS: 1.4 10*3/uL (ref 0.7–4.0)
LYMPHS PCT: 28 % (ref 12–46)
MCH: 33.8 pg (ref 26.0–34.0)
MCHC: 33.3 g/dL (ref 30.0–36.0)
MCV: 101.4 fL — AB (ref 78.0–100.0)
Monocytes Absolute: 0.7 10*3/uL (ref 0.1–1.0)
Monocytes Relative: 14 % — ABNORMAL HIGH (ref 3–12)
NEUTROS PCT: 57 % (ref 43–77)
Neutro Abs: 2.9 10*3/uL (ref 1.7–7.7)
Platelets: 51 10*3/uL — ABNORMAL LOW (ref 150–400)
RBC: 4.35 MIL/uL (ref 4.22–5.81)
RDW: 17 % — AB (ref 11.5–15.5)
WBC: 5 10*3/uL (ref 4.0–10.5)

## 2014-11-08 LAB — FERRITIN: Ferritin: 587 ng/mL — ABNORMAL HIGH (ref 22–322)

## 2014-11-08 MED ORDER — FUROSEMIDE 40 MG PO TABS: ORAL_TABLET | ORAL | Status: DC

## 2014-11-08 NOTE — Patient Instructions (Signed)
[facility] Discharge Instructions  RECOMMENDATIONS MADE BY THE CONSULTANT AND ANY TEST RESULTS WILL BE SENT TO YOUR REFERRING PHYSICIAN.  Increase Lasix to 80mg  every morning (and new prescription has been sent to your pharmacy). Return in 2 months for lab work. Return in 4 months for office visit with Tom.  Thank you for choosing [facility] to provide your oncology and hematology care.  To afford each patient quality time with our providers, please arrive at least 15 minutes before your scheduled appointment time.  With your help, our goal is to use those 15 minutes to complete the necessary work-up to ensure our physicians have the information they need to help with your evaluation and healthcare recommendations.    Effective January 1st, 2014, we ask that you re-schedule your appointment with our physicians should you arrive 10 or more minutes late for your appointment.  We strive to give you quality time with our providers, and arriving late affects you and other patients whose appointments are after yours.    Again, thank you for choosing [facility].  Our hope is that these requests will decrease the amount of time that you wait before being seen by our physicians.       _____________________________________________________________  Should you have questions after your visit to [facility], please contact our office at [phone] between the hours of 8:30 a.m. and 4:30 p.m.  Voicemails left after 4:30 p.m. will not be returned until the following business day.  For prescription refill requests, have your pharmacy contact our office with your prescription refill request.    _______________________________________________________________  We hope that we have given you very good care.  You may receive a patient satisfaction survey in the mail, please complete it and return it as soon as possible.  We value your  feedback!  _______________________________________________________________  Have you asked about our STAR program?  STAR stands for Survivorship Training and Rehabilitation, and this is a nationally recognized cancer care program that focuses on survivorship and rehabilitation.  Cancer and cancer treatments may cause problems, such as, pain, making you feel tired and keeping you from doing the things that you need or want to do. Cancer rehabilitation can help. Our goal is to reduce these troubling effects and help you have the best quality of life possible.  You may receive a survey from a nurse that asks questions about your current state of health.  Based on the survey results, all eligible patients will be referred to the [facility] program for an evaluation so we can better serve you!  A frequently asked questions sheet is available upon request.

## 2014-11-08 NOTE — Progress Notes (Signed)
[facility]  OFFICE PROGRESS NOTE  [name], PA-C [address] [place] [address]  DIAGNOSIS: Iron deficiency anemia - Plan: CBC with Differential, Ferritin  Liver cirrhosis secondary to [name]  Thrombocytopenia due to hypersplenism  Esophageal varices  Chief Complaint  Patient presents with  . Iron deficiency  . Liver cirrhosis secondary to [name]     CURRENT THERAPY: Intermittent iron infusions in the setting ofNASH cirrhosis with hypersplenism with last hemoglobin on 11/04/2014 of 14.3 and ferritin last on 10/04/2014 of 104.  INTERVAL HISTORY: Kyle [name] 58 y.o. male returns for follow-up of multifactorial anemia in the setting of [name] cirrhosis with hypersplenism and consequent pancytopenia.  He has develop increasing swelling of his abdomen and both lower extremities along with significant weight gain. It is more difficult for him to get around. He does have nocturia 3-4 along with polyuria and polydipsia. Blood sugars out of control. Insulin dose was recently changed. He denies any fever, night sweats, nausea, vomiting, melena, hematochezia, hematuria, and has been more lethargic of late according to him and his wife. He denies any cough, wheezing, sore throat, headache, or seizures.  MEDICAL HISTORY: Past Medical History  Diagnosis Date  . DM (diabetes mellitus)   . Cirrhosis     bx proven steatohepatitis with cirrhosis (2010); per note from Feb 2011, received Hep A and B vaccines in 2010.  [name] HTN (hypertension)   . RLS (restless legs syndrome)   . Hyperlipidemia   . IDA (iron deficiency anemia)   . GERD (gastroesophageal reflux disease)   . Depression   . Peripheral neuropathy   . Urothelial cancer     2010, paillary low-grade, h/o recurrence 2011  . B12 deficiency   . Psoriasis   . 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 Anemia due to multiple mechanisms 05/13/2011  . History of alcohol  abuse 05/13/2011  . ANEMIA-IRON DEFICIENCY 03/09/2009  . S/P endoscopy May 2012    4 columns grade II esophageal varices; due for repeat in Nov 2013   . S/P colonoscopy May 2012    Tubular adenoma  . Low back pain   . Cancer     bladder ca 05/2009 removal and  w/chemo wash  . Esophageal varices with bleeding(456.0) 11/24/11    s/p emergent EGD 11/25/11 by Dr. Hilarie Fredrickson at [facility], Grade III esophageal varices s/p banding X 5  . Small bowel obstruction 02/15/2012    Admitted to [facility], managed by Dr. Geroge Baseman, ventral hernia manually reduced  . Ventral hernia 02/15/12  . MRSA (methicillin resistant Staphylococcus aureus)   . Acute post-ligation esophageal ulcer with hemorrhage 04/11/2012  . Clostridium difficile infection   . H. pylori infection 12/24/2012  . Sleep apnea     [facility] took him off CPAP when had tips procedure  . Sleep apnea   . Cellulitis 04/2014    right upper leg  . Iron deficiency anemia secondary to blood loss (chronic) 03/09/2009    Secondary to GI blood loss- Rectal and esophageal varices, portal gastropathy, and esophageal ulcers      INTERIM HISTORY: has BLADDER CANCER; DIABETES MELLITUS, TYPE II, CONTROLLED, WITH COMPLICATIONS; ADRENAL MASS; HYPERLIPIDEMIA; Iron deficiency anemia due to chronic blood loss; ANEMIA, VITAMIN B12 DEFICIENCY; SMOKER; DEPRESSION; SLEEP APNEA, OBSTRUCTIVE, MODERATE; RESTLESS LEG SYNDROME; HYPERTENSION; GERD; HEPATIC CIRRHOSIS; Other chronic nonalcoholic liver disease; ARTHRITIS; SHOULDER PAIN, LEFT; INSOMNIA; FATIGUE; WEIGHT GAIN; CHEST DISCOMFORT; PROTEINURIA; ABNORMAL ELECTROCARDIOGRAM; Thrombocytopenia due  to hypersplenism; Anemia due to multiple mechanisms; History of alcohol abuse; Abdominal pain; External hemorrhoid; Upper GI bleed; Hypotension; Diarrhea; Esophageal varices; Umbilical hernia; History of small bowel obstruction; Acute post-ligation esophageal ulcer with hemorrhage; Acute posthemorrhagic anemia; Acute respiratory failure; UTI (urinary tract  infection); Hyperkalemia; H. pylori infection; Microscopic hematuria; Hemorrhagic shock; Respiratory failure with hypercapnia; Aspiration pneumonia; SIRS (systemic inflammatory response syndrome); Seizure; Fever, unspecified; Chronic kidney disease, stage II (mild); and Periumbilical abdominal pain on his problem list.    ALLERGIES:  is allergic to aspirin; ibuprofen; and tylenol.  MEDICATIONS: has a current medication list which includes the following prescription(s): carvedilol, doxycycline, furosemide, gabapentin, glucose blood, insulin regular human concentrated, lactulose, levofloxacin, omeprazole, propranolol, spironolactone, xifaxan, and zolpidem.  SURGICAL HISTORY:  Past Surgical History  Procedure Laterality Date  . Carpel tunnel Right   . Shoulder surgery Left   . Adrenal mass surgery  05/2009    benign, left  . Bladder surgery  01/2009 and 06/2010    cancer 2010, small recurrence in 06/2010  . Colonoscopy  04/2009    moderate int hemorrhoids, rare sigmoid diverticula, one mm sessile hyperplastic rectal polyp  . Esophagogastroduodenoscopy  03/2009    small hh  . Small bowel capsule endoscopy  03/2009    couple of small benign appearing erosions, nonbleeding  . Egd/tcs  08/2007    small hiatal hernia, pancolonic diverticula, friable anal canal, 3cm salmon colored epithelium in distal esophagus, bx negative for Barrett's  . Skin cancer excision  Oct 2012    left arm  . Esophagogastroduodenoscopy  11/25/2011    Dr. Phineas Douglas III varices in the mid and distal esophagus, banding placed, portal astropathy  . Esophagogastroduodenoscopy  01/14/2012    Dr Rourk->4-5 columns Gr2 varices, 6 bands placed, HH, distal esophageal ulcer, portal gastropathy, antral erosions  . Esophagogastroduodenoscopy  03/17/2012    Dr. Larina Bras varices status post band ligation. Hiatal hernia. Portal gastropathy.  . Umbilical hernia repair  04/05/2012    Procedure: HERNIA REPAIR UMBILICAL ADULT;  Surgeon:  Donato Heinz, MD;  Location: [facility];  Service: General;  Laterality: N/A;  . Esophagogastroduodenoscopy  04/11/2012    Dr. Bridgett Larsson ulcer possibly at previous banding sites with stigmata     of bleeding.  Attempt at hemostasis with hemoclip and banding was not successful resulting in recurrence of bleed/  Portal gastropathy, but no evidence of gastric varices/ Recurrent esophageal varices grade 2.  . Esophagogastroduodenoscopy  05/24/2012    Dr. Larina Bras varices, portal gastropathy  . Esophagogastroduodenoscopy  04/11/12    Dr. Drue Novel in the distal esophagus adherent lot and visible vessel treated with bicap. grade I varices in the distal esophagus, one ligating band placed, portal hypertensive gastropathy in the body of the stomach.  . Colonoscopy  09/29/2012    LXB:WIOMBT varices. Scattered pancolonic diverticula/single cecal polyp-removed as described above  . Esophagogastroduodenoscopy  12/24/2012    Dr. Lynwood Dawley significant upper GI bleed secondary to bleeding esophageal varices. s/p hemostasis therapy with injection of a total of 11m of 5% ethanolamine and application of 9 bands, complete egd not carried out.  . Schlerotherapy  12/24/2012    Procedure: SWoodward KuOF VARICES;  Surgeon: RDaneil Dolin MD;  Location: [facility];  Service: Endoscopy;  Laterality: N/A;  . Esophageal banding  12/24/2012    RDHR:CBULAGTXMIWOEHOsignificant upper GI bleed secondary to bleeding esophageal varices  . Tips procedure  12/2012    [facility]  . Cataract extraction, bilateral      FAMILY HISTORY:  family history includes Cancer in his mother; Cirrhosis in his brother and father; HIV in his brother; Kidney cancer in his mother. There is no history of Colon cancer, Anesthesia problems, Hypotension, Malignant hyperthermia, or Pseudochol deficiency.  SOCIAL HISTORY:  reports that he has been smoking Cigarettes.  He has a 45 pack-year smoking history. He has never used smokeless  tobacco. He reports that he does not drink alcohol or use illicit drugs.  REVIEW OF SYSTEMS:  Other than that discussed above is noncontributory.  PHYSICAL EXAMINATION: ECOG PERFORMANCE STATUS: 1 - Symptomatic but completely ambulatory  Blood pressure 115/71, pulse 79, temperature 98.1 F (36.7 C), temperature source Oral, resp. rate 20, weight 274 lb (124.286 kg), SpO2 97 %.  GENERAL:alert, no distress and comfortable SKIN: skin color, texture, turgor are normal, no rashes or significant lesions EYES: PERLA; Conjunctiva are pink and non-injected, sclera clear SINUSES: No redness or tenderness over maxillary or ethmoid sinuses OROPHARYNX:no exudate, no erythema on lips, buccal mucosa, or tongue. NECK: supple, thyroid normal size, non-tender, without nodularity. No masses CHEST: Normal AP diameter with bilateral gynecomastia. Spider angiomata are noted.. Morbidly obese. LYMPH:  no palpable lymphadenopathy in the cervical, axillary or inguinal LUNGS: clear to auscultation and percussion with normal breathing effort HEART: regular rate & rhythm and no murmurs. ABDOMEN:abdomen soft, non-tender and normal bowel sounds. Distended with positive fluid wave and shifting dullness. MUSCULOSKELETAL:no cyanosis of digits and no clubbing. Range of motion normal. +3 bilateral lower 70 edema without weeping or stasis dermatitis. NEURO: alert & oriented x 3 with fluent speech, no focal motor/sensory deficits. No evidence of asterixis.   LABORATORY DATA: Lab on 11/08/2014  Component Date Value Ref Range Status  . WBC 11/08/2014 5.0  4.0 - 10.5 K/uL Final  . RBC 11/08/2014 4.35  4.22 - 5.81 MIL/uL Final  . Hemoglobin 11/08/2014 14.7  13.0 - 17.0 g/dL Final  . HCT 11/08/2014 44.1  39.0 - 52.0 % Final  . MCV 11/08/2014 101.4* 78.0 - 100.0 fL Final  . MCH 11/08/2014 33.8  26.0 - 34.0 pg Final  . MCHC 11/08/2014 33.3  30.0 - 36.0 g/dL Final  . RDW 11/08/2014 17.0* 11.5 - 15.5 % Final  . Platelets  11/08/2014 51* 150 - 400 K/uL Final   Comment: SPECIMEN CHECKED FOR CLOTS CONSISTENT WITH PREVIOUS RESULT   . Neutrophils Relative % 11/08/2014 57  43 - 77 % Final  . Neutro Abs 11/08/2014 2.9  1.7 - 7.7 K/uL Final  . Lymphocytes Relative 11/08/2014 28  12 - 46 % Final  . Lymphs Abs 11/08/2014 1.4  0.7 - 4.0 K/uL Final  . Monocytes Relative 11/08/2014 14* 3 - 12 % Final  . Monocytes Absolute 11/08/2014 0.7  0.1 - 1.0 K/uL Final  . Eosinophils Relative 11/08/2014 1  0 - 5 % Final  . Eosinophils Absolute 11/08/2014 0.1  0.0 - 0.7 K/uL Final  . Basophils Relative 11/08/2014 0  0 - 1 % Final  . Basophils Absolute 11/08/2014 0.0  0.0 - 0.1 K/uL Final  Admission on 11/04/2014, Discharged on 11/04/2014  Component Date Value Ref Range Status  . WBC 11/04/2014 4.2  4.0 - 10.5 K/uL Final  . RBC 11/04/2014 4.26  4.22 - 5.81 MIL/uL Final  . Hemoglobin 11/04/2014 14.3  13.0 - 17.0 g/dL Final  . HCT 11/04/2014 41.7  39.0 - 52.0 % Final  . MCV 11/04/2014 97.9  78.0 - 100.0 fL Final  . MCH 11/04/2014 33.6  26.0 - 34.0 pg Final  .  MCHC 11/04/2014 34.3  30.0 - 36.0 g/dL Final  . RDW 11/04/2014 16.8* 11.5 - 15.5 % Final  . Platelets 11/04/2014 48* 150 - 400 K/uL Final   SPECIMEN CHECKED FOR CLOTS  . Sodium 11/04/2014 133* 137 - 147 mEq/L Final  . Potassium 11/04/2014 4.4  3.7 - 5.3 mEq/L Final  . Chloride 11/04/2014 97  96 - 112 mEq/L Final  . CO2 11/04/2014 21  19 - 32 mEq/L Final  . Glucose, Bld 11/04/2014 347* 70 - 99 mg/dL Final  . BUN 11/04/2014 13  6 - 23 mg/dL Final  . Creatinine, Ser 11/04/2014 1.27  0.50 - 1.35 mg/dL Final  . Calcium 11/04/2014 9.2  8.4 - 10.5 mg/dL Final  . Total Protein 11/04/2014 7.4  6.0 - 8.3 g/dL Final  . Albumin 11/04/2014 2.4* 3.5 - 5.2 g/dL Final  . AST 11/04/2014 55* 0 - 37 U/L Final  . ALT 11/04/2014 33  0 - 53 U/L Final  . Alkaline Phosphatase 11/04/2014 164* 39 - 117 U/L Final  . Total Bilirubin 11/04/2014 3.4* 0.3 - 1.2 mg/dL Final  . GFR calc non Af Amer  11/04/2014 61* >90 mL/min Final  . GFR calc Af Amer 11/04/2014 71* >90 mL/min Final   Comment: (NOTE) The eGFR has been calculated using the CKD EPI equation. This calculation has not been validated in all clinical situations. eGFR's persistently <90 mL/min signify possible Chronic Kidney Disease.   . Anion gap 11/04/2014 15  5 - 15 Final  . Troponin I 11/04/2014 <0.30  <0.30 ng/mL Final   Comment:        Due to the release kinetics of cTnI, a negative result within the first hours of the onset of symptoms does not rule out myocardial infarction with certainty. If myocardial infarction is still suspected, repeat the test at appropriate intervals.   . Pro B Natriuretic peptide (BNP) 11/04/2014 158.8* 0 - 125 pg/mL Final    PATHOLOGY: No new pathology.  Urinalysis    Component Value Date/Time   COLORURINE STRAW* 02/04/2013 1332   APPEARANCEUR CLOUDY* 02/04/2013 1332   LABSPEC >1.030* 02/04/2013 1332   PHURINE 6.0 02/04/2013 1332   GLUCOSEU 250* 02/04/2013 1332   HGBUR TRACE* 02/04/2013 1332   HGBUR trace-intact 03/09/2009 1039   BILIRUBINUR NEGATIVE 02/04/2013 1332   KETONESUR NEGATIVE 02/04/2013 1332   PROTEINUR NEGATIVE 02/04/2013 1332   UROBILINOGEN 0.2 02/04/2013 1332   NITRITE NEGATIVE 02/04/2013 1332   LEUKOCYTESUR NEGATIVE 02/04/2013 1332    RADIOGRAPHIC STUDIES: Dg Chest 2 View  11/04/2014   CLINICAL DATA:  Shortness of breath. Bilateral lower extremity swelling today. History of bladder cancer, diabetes, GERD, cirrhosis, hypertension, and smoking.  EXAM: CHEST  2 VIEW  COMPARISON:  07/07/2014  FINDINGS: The heart is mildly enlarged. There are perihilar and interstitial of mild pulmonary edema. Pulmonary vascular congestion is present. No overt alveolar edema or pleural effusions.  IMPRESSION: Mild pulmonary edema.   Electronically Signed   By: Shon Hale M.D.   On: 11/04/2014 21:17    ASSESSMENT:  1. Multifactorial anemia, last Feraheme on 10/13/2014 (510 mg).   2. Iron deficiency anemia requiring IV Feraheme infusions  3. [name]-induced cirrhosis of the liver with fluid retention..  4. Hospitalization for seizure-like activity due to hyperglycemia in March 2014.  5. Recent Hospitalization from severe upper esophageal bleeding requiring multiple PRBC transfusions, intubation, and transfer to [facility] for TIPS procedure which was performed on 01/17/2013. He was in ICU at [facility] for over 20 days.  6. Obesity  7. Probably lung disease secondary to longstanding smoking history.  8. DM  9. Esophageal varices  10. RLS, failed Tramadol and Gabapentin   PLAN:  #1. Increase furosemide 80 mg each morning and continue other medications. #2. No iron infusion today. #3. Ferritin plus CBC in 2 months. #4. Office visit with ferritin, CBC in 4 months.   All questions were answered. The patient knows to call the clinic with any problems, questions or concerns. We can certainly see the patient much sooner if necessary.   I spent 25 minutes counseling the patient face to face. The total time spent in the appointment was 30 minutes.    Doroteo Bradford, MD 11/08/2014 10:01 AM  DISCLAIMER:  This note was dictated with voice recognition software.  Similar sounding words can inadvertently be transcribed inaccurately and may not be corrected upon review.

## 2014-11-08 NOTE — Progress Notes (Signed)
LABS FOR [facility],FERR  

## 2014-11-13 ENCOUNTER — Ambulatory Visit (INDEPENDENT_AMBULATORY_CARE_PROVIDER_SITE_OTHER): Payer: Medicaid Other | Admitting: Podiatry

## 2014-11-13 ENCOUNTER — Encounter: Payer: Self-pay | Admitting: Podiatry

## 2014-11-13 VITALS — BP 155/73 | HR 76 | Resp 16 | Ht 67.0 in | Wt 260.0 lb

## 2014-11-13 DIAGNOSIS — M79676 Pain in unspecified toe(s): Secondary | ICD-10-CM

## 2014-11-13 DIAGNOSIS — B351 Tinea unguium: Secondary | ICD-10-CM

## 2014-11-13 NOTE — Patient Instructions (Signed)
Diabetes and Foot Care Diabetes may cause you to have problems because of poor blood supply (circulation) to your feet and legs. This may cause the skin on your feet to become thinner, break easier, and heal more slowly. Your skin may become dry, and the skin may peel and crack. You may also have nerve damage in your legs and feet causing decreased feeling in them. You may not notice minor injuries to your feet that could lead to infections or more serious problems. Taking care of your feet is one of the most important things you can do for yourself.  HOME CARE INSTRUCTIONS  Wear shoes at all times, even in the house. Do not go barefoot. Bare feet are easily injured.  Check your feet daily for blisters, cuts, and redness. If you cannot see the bottom of your feet, use a mirror or ask someone for help.  Wash your feet with warm water (do not use hot water) and mild soap. Then pat your feet and the areas between your toes until they are completely dry. Do not soak your feet as this can dry your skin.  Apply a moisturizing lotion or petroleum jelly (that does not contain alcohol and is unscented) to the skin on your feet and to dry, brittle toenails. Do not apply lotion between your toes.  Trim your toenails straight across. Do not dig under them or around the cuticle. File the edges of your nails with an emery board or nail file.  Do not cut corns or calluses or try to remove them with medicine.  Wear clean socks or stockings every day. Make sure they are not too tight. Do not wear knee-high stockings since they may decrease blood flow to your legs.  Wear shoes that fit properly and have enough cushioning. To break in new shoes, wear them for just a few hours a day. This prevents you from injuring your feet. Always look in your shoes before you put them on to be sure there are no objects inside.  Do not cross your legs. This may decrease the blood flow to your feet.  If you find a minor scrape,  cut, or break in the skin on your feet, keep it and the skin around it clean and dry. These areas may be cleansed with mild soap and water. Do not cleanse the area with peroxide, alcohol, or iodine.  When you remove an adhesive bandage, be sure not to damage the skin around it.  If you have a wound, look at it several times a day to make sure it is healing.  Do not use heating pads or hot water bottles. They may burn your skin. If you have lost feeling in your feet or legs, you may not know it is happening until it is too late.  Make sure your health care provider performs a complete foot exam at least annually or more often if you have foot problems. Report any cuts, sores, or bruises to your health care provider immediately. SEEK MEDICAL CARE IF:   You have an injury that is not healing.  You have cuts or breaks in the skin.  You have an ingrown nail.  You notice redness on your legs or feet.  You feel burning or tingling in your legs or feet.  You have pain or cramps in your legs and feet.  Your legs or feet are numb.  Your feet always feel cold. SEEK IMMEDIATE MEDICAL CARE IF:   There is increasing redness,   swelling, or pain in or around a wound.  There is a red line that goes up your leg.  Pus is coming from a wound.  You develop a fever or as directed by your health care provider.  You notice a bad smell coming from an ulcer or wound. Document Released: 11/07/2000 Document Revised: 07/13/2013 Document Reviewed: 04/19/2013 Exit[employer] Patient Information 2015 [employer]. This information is not intended to replace advice given to you by your health care provider. Make sure you discuss any questions you have with your health care provider.  

## 2014-11-13 NOTE — Progress Notes (Signed)
   Subjective:    Patient ID: Kyle [name], male    DOB: July 16, 1956, 58 y.o.   MRN: [id number]  HPI Comments: N diabetic foot exam L B/L foot D and O long-term C thickened toenails and callouses A diabetes and difficult to cut T Dr. Buddy Duty referred  Diabetes Associated symptoms include fatigue and weakness.      Review of Systems  Constitutional: Positive for activity change, fatigue and unexpected weight change.       Edema in feet.  Respiratory: Positive for cough, shortness of breath and wheezing.   Cardiovascular: Positive for leg swelling.  Gastrointestinal: Positive for diarrhea, constipation and abdominal distention.  Endocrine:       Pt states his blood sugars are not controlled.  Genitourinary: Positive for urgency and frequency.  Musculoskeletal: Positive for back pain and gait problem.  Neurological: Positive for weakness and light-headedness.  Hematological:       Slow to heal  All other systems reviewed and are negative.      Objective:   Physical Exam Orientated 3 patient presents with wife  Vascular: Mild pitting edema ankles bilaterally Small varicosities ankles bilaterally DP pulse 2/4 bilaterally PT pulses 2/4 bilaterally  Neurological: Ankle reflex equal and reactive bilaterally Vibratory sensation intact bilaterally Sensation to 10 g monofilament wire intact 5/5 bilaterally  Dermatological: The toenails are elongated, hypertrophic, incurvated, tender to palpation 6-10 Keratoses plantar left first MPJ  Musculoskeletal: Pes planus bilaterally HAV deformity left Hammertoe deformity second left     Assessment & Plan:   Assessment: Satisfactory vascular status Protective sensation intact Onychomycoses with symptoms 6-10  Plan: Diabetic foot exam today Nails 10 are debrided without  bleeding Minimal callus plantar left no need for debridement

## 2014-11-14 ENCOUNTER — Encounter: Payer: Self-pay | Admitting: Podiatry

## 2014-11-30 ENCOUNTER — Ambulatory Visit (INDEPENDENT_AMBULATORY_CARE_PROVIDER_SITE_OTHER): Payer: Medicaid Other | Admitting: Physician Assistant

## 2014-11-30 ENCOUNTER — Encounter: Payer: Self-pay | Admitting: Physician Assistant

## 2014-11-30 ENCOUNTER — Ambulatory Visit (HOSPITAL_COMMUNITY)
Admission: RE | Admit: 2014-11-30 | Discharge: 2014-11-30 | Disposition: A | Discharge status: Home / Self Care | Payer: Medicaid Other | Source: Ambulatory Visit | Attending: Physician Assistant | Admitting: Physician Assistant

## 2014-11-30 VITALS — BP 122/76 | HR 88 | Temp 98.3°F | Resp 20 | Wt 267.0 lb

## 2014-11-30 DIAGNOSIS — M5136 Other intervertebral disc degeneration, lumbar region: Secondary | ICD-10-CM | POA: Insufficient documentation

## 2014-11-30 DIAGNOSIS — M545 Low back pain, unspecified: Secondary | ICD-10-CM

## 2014-11-30 DIAGNOSIS — L0291 Cutaneous abscess, unspecified: Secondary | ICD-10-CM

## 2014-11-30 DIAGNOSIS — M47894 Other spondylosis, thoracic region: Secondary | ICD-10-CM | POA: Insufficient documentation

## 2014-11-30 DIAGNOSIS — L0390 Cellulitis, unspecified: Secondary | ICD-10-CM

## 2014-11-30 MED ORDER — SULFAMETHOXAZOLE-TRIMETHOPRIM 800-160 MG PO TABS: 1.0000 | ORAL_TABLET | Freq: Two times a day (BID) | ORAL | Status: DC

## 2014-11-30 MED ORDER — CYCLOBENZAPRINE HCL 10 MG PO TABS: 10.0000 mg | ORAL_TABLET | Freq: Three times a day (TID) | ORAL | Status: DC | PRN

## 2014-11-30 NOTE — Progress Notes (Signed)
Patient ID: HARDIE VELTRE MRN: [id number], DOB: 08-Jul-1956, 59 y.o. Date of Encounter: 11/30/2014, 5:13 PM    Chief Complaint:  Chief Complaint  Patient presents with  . back pain         HPI: 59 y.o. year old white male points to his back at about L1 level and says that he has been having  pain across both sides of his back at that area for about 1 week but it is getting worse. Says that anytime he tries to stand up straight he has to bend forward because otherwise he has such discomfort there across his back. He says that the pain is equal bilaterally on both sides. Says that he did no unusual activity to have caused this pain that he can think of.  Also at the end of the visit he says that he also has just developed this area on his right forearm which just started yesterday. Says that  " every time he goes in the hospital for something he ends up with these kinds of spots."     Home Meds:   Outpatient Prescriptions Prior to Visit  Medication Sig Dispense Refill  . carvedilol (COREG) 6.25 MG tablet TAKE ONE TABLET BY MOUTH TWICE DAILY WITH A MEAL 60 tablet 0  . furosemide (LASIX) 40 MG tablet Take 2 tablets each morning. 60 tablet 6  . gabapentin (NEURONTIN) 300 MG capsule TAKE 1 CAPSULE BY MOUTH IN THE MORNING, TAKE 1 CAPSULE BY MOUTH AT NOON, THEN TAKE 3 CAPSULES BY MOUTH AT BEDTIME 150 capsule 5  . glucose blood (ACCU-CHEK AVIVA) test strip 1 each by Other route 3 (three) times daily. Use as instructed 100 each 12  . insulin regular human CONCENTRATED (HUMULIN R) 500 UNIT/ML SOLN injection Inject 60 Units into the skin 3 (three) times daily.    [name] lactulose (CHRONULAC) 10 GM/15ML solution Take 15CC orally up to three times a day to achieve 2 to 3 soft BMs daily. (Patient taking differently: Take 10 g by mouth 3 (three) times daily. Take 15CC orally up to three times a day to achieve 2 to 3 soft BMs daily.) 1892 mL 3  . omeprazole (PRILOSEC) 40 MG capsule Take 1 capsule (40 mg  total) by mouth daily. 30 capsule 11  . propranolol (INDERAL) 20 MG tablet Take 1 tablet (20 mg total) by mouth 2 (two) times daily. 60 tablet 11  . spironolactone (ALDACTONE) 50 MG tablet Take 50 mg by mouth daily.     [name] XIFAXAN 550 MG TABS tablet TAKE ONE TABLET BY MOUTH TWICE DAILY 60 tablet 5  . zolpidem (AMBIEN) 5 MG tablet Take 1-2 tablets (5-10 mg total) by mouth at bedtime as needed for sleep. 60 tablet 2  . doxycycline (VIBRA-TABS) 100 MG tablet Take 1 tablet (100 mg total) by mouth 2 (two) times daily. 20 tablet 0  . levofloxacin (LEVAQUIN) 500 MG tablet Take 1 tablet (500 mg total) by mouth daily. 7 tablet 0   No facility-administered medications prior to visit.    Allergies:  Allergies  Allergen Reactions  . Aspirin Other (See Comments)    Harms liver.   . Ibuprofen Other (See Comments)    Can't take per Dr Gala Romney- harms livers  . Tylenol [Acetaminophen] Other (See Comments)    Restless Legs       Review of Systems: See HPI for pertinent ROS. All other ROS negative.    Physical Exam: Blood pressure 122/76, pulse 88, temperature 98.3  F (36.8 C), temperature source Oral, resp. rate 20, weight 267 lb (121.11 kg)., Body mass index is 41.81 kg/(m^2). General:  WM. Appears in no acute distress. Lungs: Clear bilaterally to auscultation without wheezes, rales, or rhonchi. Breathing is unlabored. Heart: Regular rhythm. No murmurs, rubs, or gallops. Msk:  Strength and tone normal for age. Points to approx L1 level as level of pain in back, bilaterally. Minimal pain is reproduced with palpation of the area. Extremities/Skin:  Right forearm has a 1 cm diameter round area of erythema. There is no fluctuant abscess to incise and drain. Neuro: Alert and oriented X 3. Moves all extremities spontaneously. Gait is normal. CNII-XII grossly in tact. Psych:  Responds to questions appropriately with a normal affect.     ASSESSMENT AND PLAN:  59 y.o. year old male with  1. Bilateral  low back pain without sciatica Will obtain x-rays of lumbar and thoracic spine. Will prescribe muscle relaxer. Cautioned him of possible drowsiness with this. Recommended that he apply heat in the form of heating pad or warm water in the shower. Gently stretch the area. - DG Lumbar Spine Complete; Future - DG Thoracic Spine W/Swimmers; Future - cyclobenzaprine (FLEXERIL) 10 MG tablet; Take 1 tablet (10 mg total) by mouth 3 (three) times daily as needed for muscle spasms.  Dispense: 30 tablet; Refill: 0  2. Cellulitis and abscess Told him to start the antibiotic immediately. Take as directed and complete all of it. If the site worsens at all then follow-up immediately. Site does not completely resolve at completion of antibiotics then follow-up as well. Explained to him that this is an infection and discussed proper hygiene to prevent spread of infection. Suspect that this is MRSA. - sulfamethoxazole-trimethoprim (BACTRIM DS,SEPTRA DS) 800-160 MG per tablet; Take 1 tablet by mouth 2 (two) times daily.  Dispense: 20 tablet; Refill: 0   Signed, [address] [place], [place], [facility] 11/30/2014 5:13 PM

## 2014-12-07 ENCOUNTER — Encounter (HOSPITAL_COMMUNITY): Payer: Self-pay | Admitting: General Surgery

## 2014-12-08 ENCOUNTER — Telehealth: Payer: Self-pay | Admitting: Physician Assistant

## 2014-12-08 NOTE — Telephone Encounter (Signed)
I called patient and made him a follow up appt for next week.

## 2014-12-08 NOTE — Telephone Encounter (Signed)
Patient calling to let you know that the muscle relaxer that mary beth put him on is not working would like to know what he can do about this  [phone]

## 2014-12-13 ENCOUNTER — Ambulatory Visit: Payer: Medicaid Other | Admitting: Physician Assistant

## 2014-12-14 ENCOUNTER — Ambulatory Visit (INDEPENDENT_AMBULATORY_CARE_PROVIDER_SITE_OTHER): Payer: Medicaid Other | Admitting: Physician Assistant

## 2014-12-14 ENCOUNTER — Encounter: Payer: Self-pay | Admitting: Physician Assistant

## 2014-12-14 VITALS — BP 116/74 | HR 92 | Temp 97.9°F | Resp 18 | Wt 274.0 lb

## 2014-12-14 DIAGNOSIS — M545 Low back pain, unspecified: Secondary | ICD-10-CM

## 2014-12-14 DIAGNOSIS — M546 Pain in thoracic spine: Secondary | ICD-10-CM

## 2014-12-14 NOTE — Progress Notes (Signed)
Patient ID: Kyle [name] MRN: [id number], DOB: 1956/07/13, 59 y.o. Date of Encounter: 12/14/2014, 10:55 AM    Chief Complaint:  Chief Complaint  Patient presents with  . back pain         HPI: 59 y.o. year old white male   The following is copied from his office note with me 11/30/14: "He points to his back at about L1 level and says that he has been having  pain across both sides of his back at that area for about 1 week but it is getting worse. Says that anytime he tries to stand up straight he has to bend forward because otherwise he has such discomfort there across his back. He says that the pain is equal bilaterally on both sides. Says that he did no unusual activity to have caused this pain that he can think of."   ASSESSMENT AND PLAN:  59 y.o. year old male with  1. Bilateral low back pain without sciatica Will obtain x-rays of lumbar and thoracic spine. Will prescribe muscle relaxer. Cautioned him of possible drowsiness with this. Recommended that he apply heat in the form of heating pad or warm water in the shower. Gently stretch the area. - DG Lumbar Spine Complete; Future - DG Thoracic Spine W/Swimmers; Future - cyclobenzaprine (FLEXERIL) 10 MG tablet; Take 1 tablet (10 mg total) by mouth 3 (three) times daily as needed for muscle spasms.  Dispense: 30 tablet; Refill: 0  X-ray of lumbar spine showed: Disc space narrowing inferior lumbar spine. Tiny endplate spurs at K1-S0.  Facet degenerative changes lower lumbar spine. Minimal chronic appearing height loss anteriorly at T12, unchanged from prior CT. Questionable left spondylosis  L5.  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 Question left spondylolysis L5.  X-ray of thoracic spine showed: Disc space narrowing and endplate hypertrophic changes at multiple levels throughout the thoracic spine, worst at T10-11 and T11-12. Pedicles intact. Overall impression: No acute  osseous abnormality. Diffuse thoracic spondylosis.   TODAY--12/14/14: Patient states that he has been taking the muscle relaxer. Says that he has also been applying heat as instructed. Says he has had no improvement at all. He says that " it will hurt until it goes numb."  " Then he will change his position and pain will  resolve momentarily but then once he moves position again, it starts to hurt again-- until the point that it feels numb." This pain/discomfort is staying in his back on both sides. There is no radiation of the pain to any other areas. Says that it continues to be bilateral.     Home Meds:   Outpatient Prescriptions Prior to Visit  Medication Sig Dispense Refill  . carvedilol (COREG) 6.25 MG tablet TAKE ONE TABLET BY MOUTH TWICE DAILY WITH A MEAL 60 tablet 0  . furosemide (LASIX) 40 MG tablet Take 2 tablets each morning. 60 tablet 6  . gabapentin (NEURONTIN) 300 MG capsule TAKE 1 CAPSULE BY MOUTH IN THE MORNING, TAKE 1 CAPSULE BY MOUTH AT NOON, THEN TAKE 3 CAPSULES BY MOUTH AT BEDTIME 150 capsule 5  . glucose blood (ACCU-CHEK AVIVA) test strip 1 each by Other route 3 (three) times daily. Use as instructed 100 each 12  . insulin regular human CONCENTRATED (HUMULIN R) 500 UNIT/ML SOLN injection Inject 60 Units into the skin 3 (three) times daily.    [name] lactulose (CHRONULAC) 10 GM/15ML solution Take 15CC orally up to three times a day to achieve  2 to 3 soft BMs daily. (Patient taking differently: Take 10 g by mouth 3 (three) times daily. Take 15CC orally up to three times a day to achieve 2 to 3 soft BMs daily.) 1892 mL 3  . omeprazole (PRILOSEC) 40 MG capsule Take 1 capsule (40 mg total) by mouth daily. 30 capsule 11  . propranolol (INDERAL) 20 MG tablet Take 1 tablet (20 mg total) by mouth 2 (two) times daily. 60 tablet 11  . spironolactone (ALDACTONE) 50 MG tablet Take 50 mg by mouth daily.     [name] XIFAXAN 550 MG TABS tablet TAKE ONE TABLET BY MOUTH TWICE DAILY 60 tablet 5  .  zolpidem (AMBIEN) 5 MG tablet Take 1-2 tablets (5-10 mg total) by mouth at bedtime as needed for sleep. 60 tablet 2  . cyclobenzaprine (FLEXERIL) 10 MG tablet Take 1 tablet (10 mg total) by mouth 3 (three) times daily as needed for muscle spasms. (Patient not taking: Reported on 12/14/2014) 30 tablet 0  . sulfamethoxazole-trimethoprim (BACTRIM DS,SEPTRA DS) 800-160 MG per tablet Take 1 tablet by mouth 2 (two) times daily. 20 tablet 0   No facility-administered medications prior to visit.    Allergies:  Allergies  Allergen Reactions  . Aspirin Other (See Comments)    Harms liver.   . Ibuprofen Other (See Comments)    Can't take per Dr Gala Romney- harms livers  . Tylenol [Acetaminophen] Other (See Comments)    Restless Legs       Review of Systems: See HPI for pertinent ROS. All other ROS negative.    Physical Exam: Blood pressure 116/74, pulse 92, temperature 97.9 F (36.6 C), temperature source Oral, resp. rate 18, weight 274 lb (124.286 kg)., Body mass index is 42.9 kg/(m^2). General:  WM. Appears in no acute distress. Lungs: Clear bilaterally to auscultation without wheezes, rales, or rhonchi. Breathing is unlabored. Heart: Regular rhythm. No murmurs, rubs, or gallops. Msk:  Strength and tone normal for age. Points to approx L1 level as level of pain in back, bilaterally. Minimal pain is reproduced with palpation of the area. Extremities/Skin:  Right forearm has a 1 cm diameter round area of erythema. There is no fluctuant abscess to incise and drain. Neuro: Alert and oriented X 3. Moves all extremities spontaneously. Gait is normal. CNII-XII grossly in tact. Psych:  Responds to questions appropriately with a normal affect.     ASSESSMENT AND PLAN:  59 y.o. year old male with   1. Bilateral thoracic back pain - Ambulatory referral to Neurosurgery  2. Bilateral low back pain without sciatica - Ambulatory referral to Neurosurgery   Given all of the procedures that he has had  a, I do not think he can have MRI.   I will refer him to a specialist for follow-up. Also, given all of his multiple medical problems cannot prescribe any other medication for pain/symptom relief.  Signed, [address] [place], [place], [facility] 12/14/2014 10:55 AM

## 2014-12-22 ENCOUNTER — Other Ambulatory Visit: Payer: Self-pay | Admitting: Physician Assistant

## 2014-12-22 NOTE — Telephone Encounter (Signed)
Medication refilled per protocol. 

## 2014-12-27 ENCOUNTER — Telehealth: Payer: Self-pay | Admitting: *Deleted

## 2014-12-27 NOTE — Telephone Encounter (Signed)
Submitted NCTRACKS referral for approval to Dr. Kathyrn Sheriff at [facility] with Referral #:[id number]  Referral type: Eval and treat  Number of visits: 6  Effective begin Date 01/03/15 ending 05/26/15  Referred to provider [id number] Address: [address] [place], [place]  Faxed the insurance authorization to  [facility] Referral coordinator at [phone]

## 2014-12-28 ENCOUNTER — Telehealth: Payer: Self-pay | Admitting: Internal Medicine

## 2014-12-28 ENCOUNTER — Other Ambulatory Visit: Payer: Self-pay

## 2014-12-28 NOTE — Telephone Encounter (Signed)
There is a refill request in the refill box for this. It just came today.

## 2014-12-28 NOTE — Telephone Encounter (Signed)
Patient called this afternoon saying that his Pharmacy hasn't heard from [place] and he is out of his omeprazole  DR 40 mg capsules. He uses Product/process development scientist.

## 2014-12-29 LAB — HM DIABETES EYE EXAM

## 2014-12-29 MED ORDER — OMEPRAZOLE 40 MG PO CPDR: 40.0000 mg | DELAYED_RELEASE_CAPSULE | Freq: Every day | ORAL | Status: DC

## 2014-12-29 NOTE — Addendum Note (Signed)
Addended by: Orvil Feil on: 12/29/2014 08:49 AM   Modules accepted: Orders

## 2014-12-29 NOTE — Telephone Encounter (Signed)
Completed.

## 2015-01-09 ENCOUNTER — Encounter (HOSPITAL_COMMUNITY): Payer: Medicaid Other | Attending: Hematology and Oncology

## 2015-01-09 ENCOUNTER — Other Ambulatory Visit (HOSPITAL_COMMUNITY): Payer: Self-pay

## 2015-01-09 DIAGNOSIS — D500 Iron deficiency anemia secondary to blood loss (chronic): Secondary | ICD-10-CM

## 2015-01-09 DIAGNOSIS — K7581 Nonalcoholic steatohepatitis (NASH): Secondary | ICD-10-CM | POA: Insufficient documentation

## 2015-01-09 DIAGNOSIS — D509 Iron deficiency anemia, unspecified: Secondary | ICD-10-CM | POA: Insufficient documentation

## 2015-01-09 DIAGNOSIS — I8500 Esophageal varices without bleeding: Secondary | ICD-10-CM | POA: Diagnosis not present

## 2015-01-09 DIAGNOSIS — D6959 Other secondary thrombocytopenia: Secondary | ICD-10-CM | POA: Diagnosis not present

## 2015-01-09 LAB — CBC WITH DIFFERENTIAL/PLATELET
BASOS ABS: 0 10*3/uL (ref 0.0–0.1)
BASOS PCT: 0 % (ref 0–1)
EOS ABS: 0.1 10*3/uL (ref 0.0–0.7)
Eosinophils Relative: 1 % (ref 0–5)
HCT: 40.1 % (ref 39.0–52.0)
HEMOGLOBIN: 14 g/dL (ref 13.0–17.0)
Lymphocytes Relative: 25 % (ref 12–46)
Lymphs Abs: 1.7 10*3/uL (ref 0.7–4.0)
MCH: 35.7 pg — AB (ref 26.0–34.0)
MCHC: 34.9 g/dL (ref 30.0–36.0)
MCV: 102.3 fL — ABNORMAL HIGH (ref 78.0–100.0)
MONOS PCT: 13 % — AB (ref 3–12)
Monocytes Absolute: 0.9 10*3/uL (ref 0.1–1.0)
NEUTROS ABS: 4.3 10*3/uL (ref 1.7–7.7)
NEUTROS PCT: 61 % (ref 43–77)
Platelets: 65 10*3/uL — ABNORMAL LOW (ref 150–400)
RBC: 3.92 MIL/uL — ABNORMAL LOW (ref 4.22–5.81)
RDW: 15.7 % — AB (ref 11.5–15.5)
WBC: 7 10*3/uL (ref 4.0–10.5)

## 2015-01-09 LAB — FERRITIN: Ferritin: 7031 ng/mL — ABNORMAL HIGH (ref 22–322)

## 2015-01-09 NOTE — Progress Notes (Signed)
LABS FOR [facility],FERR  

## 2015-01-16 ENCOUNTER — Encounter: Payer: Self-pay | Admitting: Family Medicine

## 2015-01-16 DIAGNOSIS — E119 Type 2 diabetes mellitus without complications: Secondary | ICD-10-CM | POA: Insufficient documentation

## 2015-01-16 DIAGNOSIS — Z0100 Encounter for examination of eyes and vision without abnormal findings: Secondary | ICD-10-CM

## 2015-01-30 ENCOUNTER — Other Ambulatory Visit: Payer: Self-pay | Admitting: Neurosurgery

## 2015-01-30 DIAGNOSIS — M47816 Spondylosis without myelopathy or radiculopathy, lumbar region: Secondary | ICD-10-CM

## 2015-02-09 ENCOUNTER — Other Ambulatory Visit: Payer: Self-pay | Admitting: Urology

## 2015-02-12 ENCOUNTER — Ambulatory Visit: Payer: Medicaid Other | Admitting: Podiatry

## 2015-02-18 ENCOUNTER — Other Ambulatory Visit: Payer: Medicaid Other

## 2015-02-20 ENCOUNTER — Ambulatory Visit
Admission: RE | Admit: 2015-02-20 | Discharge: 2015-02-20 | Disposition: A | Discharge status: Home / Self Care | Payer: Medicaid Other | Source: Ambulatory Visit | Attending: Neurosurgery | Admitting: Neurosurgery

## 2015-02-20 DIAGNOSIS — M47816 Spondylosis without myelopathy or radiculopathy, lumbar region: Secondary | ICD-10-CM

## 2015-02-22 ENCOUNTER — Other Ambulatory Visit (HOSPITAL_COMMUNITY): Payer: Self-pay | Admitting: *Deleted

## 2015-02-22 NOTE — Patient Instructions (Addendum)
NIKO PENSON  02/22/2015   Your procedure is scheduled on: Monday 03/05/2015  Report to [facility] Main  Entrance and follow signs to               [place] at 215 PM.  Call this number if you have problems the morning of surgery [phone]   Remember:  Do not eat food:After Midnight. MAY HAVE CLEAR LIQUIDS FROM MIDNIGHT UP UNTIL 1015 AM THEN NOTHING UNTIL AFTER SURGERY!   Take these medicines the morning of surgery with A SIP OF WATER:  GABAPENTIN (NEURONTIN), PROPANOLOL (INDERAL)                               You may not have any metal on your body including hair pins and              piercings  Do not wear jewelry, make-up, lotions, powders or perfumes.             Do not wear nail polish.  Do not shave  48 hours prior to surgery.              Men may shave face and neck.   Do not bring valuables to the hospital. [place].  Contacts, dentures or bridgework may not be worn into surgery.  Leave suitcase in the car. After surgery it may be brought to your room.     Patients discharged the day of surgery will not be allowed to drive home.  Name and phone number of your driver:  Special Instructions: N/A              Please read over the following fact sheets you were given: _____________________________________________________________________             [facility] - Preparing for Surgery Before surgery, you can play an important role.  Because skin is not sterile, your skin needs to be as free of germs as possible.  You can reduce the number of germs on your skin by washing with CHG (chlorahexidine gluconate) soap before surgery.  CHG is an antiseptic cleaner which kills germs and bonds with the skin to continue killing germs even after washing. Please DO NOT use if you have an allergy to CHG or antibacterial soaps.  If your skin becomes reddened/irritated stop using the CHG and inform your  nurse when you arrive at Short Stay. Do not shave (including legs and underarms) for at least 48 hours prior to the first CHG shower.  You may shave your face/neck. Please follow these instructions carefully:  1.  Shower with CHG Soap the night before surgery and the  morning of Surgery.  2.  If you choose to wash your hair, wash your hair first as usual with your  normal  shampoo.  3.  After you shampoo, rinse your hair and body thoroughly to remove the  shampoo.                           4.  Use CHG as you would any other liquid soap.  You can apply chg directly  to the skin and wash  Gently with a scrungie or clean washcloth.  5.  Apply the CHG Soap to your body ONLY FROM THE NECK DOWN.   Do not use on face/ open                           Wound or open sores. Avoid contact with eyes, ears mouth and genitals (private parts).                       Wash face,  Genitals (private parts) with your normal soap.             6.  Wash thoroughly, paying special attention to the area where your surgery  will be performed.  7.  Thoroughly rinse your body with warm water from the neck down.  8.  DO NOT shower/wash with your normal soap after using and rinsing off  the CHG Soap.                9.  Pat yourself dry with a clean towel.            10.  Wear clean pajamas.            11.  Place clean sheets on your bed the night of your first shower and do not  sleep with pets. Day of Surgery : Do not apply any lotions/deodorants the morning of surgery.  Please wear clean clothes to the hospital/surgery center.  FAILURE TO FOLLOW THESE INSTRUCTIONS MAY RESULT IN THE CANCELLATION OF YOUR SURGERY PATIENT SIGNATURE_________________________________  NURSE SIGNATURE__________________________________  ________________________________________________________________________   Adam Phenix  An incentive spirometer is a tool that can help keep your lungs clear and active. This tool  measures how well you are filling your lungs with each breath. Taking long deep breaths may help reverse or decrease the chance of developing breathing (pulmonary) problems (especially infection) following:  A long period of time when you are unable to move or be active. BEFORE THE PROCEDURE   If the spirometer includes an indicator to show your best effort, your nurse or respiratory therapist will set it to a desired goal.  If possible, sit up straight or lean slightly forward. Try not to slouch.  Hold the incentive spirometer in an upright position. INSTRUCTIONS FOR USE  1. Sit on the edge of your bed if possible, or sit up as far as you can in bed or on a chair. 2. Hold the incentive spirometer in an upright position. 3. Breathe out normally. 4. Place the mouthpiece in your mouth and seal your lips tightly around it. 5. Breathe in slowly and as deeply as possible, raising the piston or the ball toward the top of the column. 6. Hold your breath for 3-5 seconds or for as long as possible. Allow the piston or ball to fall to the bottom of the column. 7. Remove the mouthpiece from your mouth and breathe out normally. 8. Rest for a few seconds and repeat Steps 1 through 7 at least 10 times every 1-2 hours when you are awake. Take your time and take a few normal breaths between deep breaths. 9. The spirometer may include an indicator to show your best effort. Use the indicator as a goal to work toward during each repetition. 10. After each set of 10 deep breaths, practice coughing to be sure your lungs are clear. If you have an incision (the cut made at the time of surgery),  support your incision when coughing by placing a pillow or rolled up towels firmly against it. Once you are able to get out of bed, walk around indoors and cough well. You may stop using the incentive spirometer when instructed by your caregiver.  RISKS AND COMPLICATIONS  Take your time so you do not get dizzy or  light-headed.  If you are in pain, you may need to take or ask for pain medication before doing incentive spirometry. It is harder to take a deep breath if you are having pain. AFTER USE  Rest and breathe slowly and easily.  It can be helpful to keep track of a log of your progress. Your caregiver can provide you with a simple table to help with this. If you are using the spirometer at home, follow these instructions: [place] IF:   You are having difficultly using the spirometer.  You have trouble using the spirometer as often as instructed.  Your pain medication is not giving enough relief while using the spirometer.  You develop fever of 100.5 F (38.1 C) or higher. SEEK IMMEDIATE MEDICAL CARE IF:   You cough up bloody sputum that had not been present before.  You develop fever of 102 F (38.9 C) or greater.  You develop worsening pain at or near the incision site. MAKE SURE YOU:   Understand these instructions.  Will watch your condition.  Will get help right away if you are not doing well or get worse. Document Released: 03/23/2007 Document Revised: 02/02/2012 Document Reviewed: 05/24/2007 ExitCare Patient Information 2014 [place].   ________________________________________________________________________    CLEAR LIQUID DIET   Foods Allowed                                                                     Foods Excluded  Coffee and tea, regular and decaf                             liquids that you cannot  Plain Jell-O in any flavor                                             see through such as: Fruit ices (not with fruit pulp)                                     milk, soups, orange juice  Iced Popsicles                                    All solid food Carbonated beverages, regular and diet                                    Cranberry, grape and apple juices Sports drinks like Gatorade Lightly seasoned clear broth or consume(fat  free) Sugar, honey syrup  Sample Menu Breakfast  Lunch                                     Supper Cranberry juice                    Beef broth                            Chicken broth Jell-O                                     Grape juice                           Apple juice Coffee or tea                        Jell-O                                      Popsicle                                                Coffee or tea                        Coffee or tea  _____________________________________________________________________

## 2015-02-23 ENCOUNTER — Inpatient Hospital Stay (HOSPITAL_COMMUNITY): Admission: RE | Admit: 2015-02-23 | Payer: Medicaid Other | Source: Ambulatory Visit

## 2015-02-23 ENCOUNTER — Encounter (HOSPITAL_COMMUNITY)
Admission: RE | Admit: 2015-02-23 | Discharge: 2015-02-23 | Disposition: A | Discharge status: Home / Self Care | Payer: Medicaid Other | Source: Ambulatory Visit | Attending: Urology | Admitting: Urology

## 2015-02-23 ENCOUNTER — Other Ambulatory Visit: Payer: Self-pay | Admitting: Urology

## 2015-02-23 ENCOUNTER — Encounter (HOSPITAL_COMMUNITY): Payer: Self-pay

## 2015-02-23 ENCOUNTER — Ambulatory Visit (HOSPITAL_COMMUNITY)
Admission: RE | Admit: 2015-02-23 | Discharge: 2015-02-23 | Disposition: A | Discharge status: Home / Self Care | Payer: Medicaid Other | Source: Ambulatory Visit | Attending: Anesthesiology | Admitting: Anesthesiology

## 2015-02-23 DIAGNOSIS — Z01818 Encounter for other preprocedural examination: Secondary | ICD-10-CM | POA: Diagnosis present

## 2015-02-23 DIAGNOSIS — I10 Essential (primary) hypertension: Secondary | ICD-10-CM

## 2015-02-23 LAB — COMPREHENSIVE METABOLIC PANEL
ALBUMIN: 2.6 g/dL — AB (ref 3.5–5.2)
ALK PHOS: 158 U/L — AB (ref 39–117)
ALT: 33 U/L (ref 0–53)
ANION GAP: 8 (ref 5–15)
AST: 87 U/L — ABNORMAL HIGH (ref 0–37)
BILIRUBIN TOTAL: 5 mg/dL — AB (ref 0.3–1.2)
BUN: 21 mg/dL (ref 6–23)
CO2: 29 mmol/L (ref 19–32)
Calcium: 9.4 mg/dL (ref 8.4–10.5)
Chloride: 102 mmol/L (ref 96–112)
Creatinine, Ser: 1.25 mg/dL (ref 0.50–1.35)
GFR, EST AFRICAN AMERICAN: 72 mL/min — AB (ref >90)
GFR, EST NON AFRICAN AMERICAN: 62 mL/min — AB (ref >90)
Glucose, Bld: 90 mg/dL (ref 70–99)
Potassium: 4.8 mmol/L (ref 3.5–5.1)
Sodium: 139 mmol/L (ref 135–145)
Total Protein: 7.1 g/dL (ref 6.0–8.3)

## 2015-02-23 LAB — CBC
HEMATOCRIT: 40.8 % (ref 39.0–52.0)
Hemoglobin: 13.6 g/dL (ref 13.0–17.0)
MCH: 35 pg — ABNORMAL HIGH (ref 26.0–34.0)
MCHC: 33.3 g/dL (ref 30.0–36.0)
MCV: 104.9 fL — ABNORMAL HIGH (ref 78.0–100.0)
Platelets: 53 10*3/uL — ABNORMAL LOW (ref 150–400)
RBC: 3.89 MIL/uL — ABNORMAL LOW (ref 4.22–5.81)
RDW: 14.4 % (ref 11.5–15.5)
WBC: 4.9 10*3/uL (ref 4.0–10.5)

## 2015-02-23 LAB — PROTIME-INR
INR: 1.35 (ref 0.00–1.49)
Prothrombin Time: 16.9 seconds — ABNORMAL HIGH (ref 11.6–15.2)

## 2015-02-23 LAB — APTT: aPTT: 35 seconds (ref 24–37)

## 2015-02-23 NOTE — Progress Notes (Signed)
   02/23/15 0909  OBSTRUCTIVE SLEEP APNEA  Have you ever been diagnosed with sleep apnea through a sleep study? Yes (pt states has had sleep apnea but doesn't need cpap anymore - assessed pt now, read below)  Do you snore loudly (loud enough to be heard through closed doors)?  0  Do you often feel tired, fatigued, or sleepy during the daytime? 1  Has anyone observed you stop breathing during your sleep? 0  Do you have, or are you being treated for high blood pressure? 1  BMI more than 35 kg/m2? 1  Age over 58 years old? 1  Neck circumference greater than 40 cm/16 inches? 1  Gender: 1  Obstructive Sleep Apnea Score 6

## 2015-02-26 ENCOUNTER — Other Ambulatory Visit: Payer: Self-pay

## 2015-02-27 ENCOUNTER — Other Ambulatory Visit (HOSPITAL_COMMUNITY): Payer: Self-pay

## 2015-02-27 DIAGNOSIS — D500 Iron deficiency anemia secondary to blood loss (chronic): Secondary | ICD-10-CM

## 2015-02-27 MED ORDER — RIFAXIMIN 550 MG PO TABS: 550.0000 mg | ORAL_TABLET | Freq: Two times a day (BID) | ORAL | Status: DC

## 2015-03-02 NOTE — H&P (Addendum)
History of Present Illness Kyle [name] is a 59 year old with the following urologic history:    1) Urothelial carcinoma of the bladder: He initially presented to me in February 2010 with an incidental bladder mass noted on a bladder ultrasound. He was found to have a large papillary bladder tumor on cystoscopy and underwent his initial TURBT on 01/22/09 which demonstrated a low-grade Ta urothelial carcinoma.  Mar 2010: TURBT -- Low-grade, Ta urothelial carcinoma (postoperative [facility])  Aug 2011: Bladder biopsy of small recurrence -- Low-grade, Ta urothelial carcinoma    Last upper tract evaluation: CT scan (Dec 2012)     2) Left adrenal mass: He was found to have an incidental 5.0 cm left adrenal mass noted on CT and MRI in January 2010. He has no symptoms of a biochemically active lesion and his biochemical evaluation has been negative for overproduction of adrenal metabolites. He underwent a left laparoscopic adrenalectomy on June 04, 2009. 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     3) Prostate cancer screening:   Family history: None  Last PSA: 0.22 (Mar 2014)    4) Erectile dysfunction: He has previously responded to PDE-5 inhibitors although noted that Viagra became less effective in January 2011. By December 2011, he was completely refractory to PDE-5 inhibitors. He does not take nitrates.  Current treatment: VED    ** Medical comorbidities: He has anemia, diabetes, thrombocytopenia, splenomegaly, and liver disease. He has been evaluated by Dr. Tressie Stalker, Dr. Gala Romney, and Dr. Lattie Haw and Dr. Verl Blalock.       Past Medical History Problems  1. History of Anxiety (F41.9) 2. History of Esophageal Varices 3. History of depression (Z86.59) 4. History of diabetes mellitus (Z86.39) 5. History of esophageal reflux (Z87.19) 6. History of hyperlipidemia (Z86.39) 7. History of hypertension (Z86.79) 8. History of sleep apnea (Z87.09) 9.  History of Restless legs syndrome (G25.81)  Surgical History Problems  1. History of Cystoscopy With Fulguration Large Lesion (Over 5cm) 2. History of Cystoscopy With Fulguration Minor Lesion (Under 68mm) 3. History of Laparoscopy With Adrenalectomy 4. History of Shoulder Surgery 5. History of Transjugular Intrahepatic Portosystemic Shunt  Current Meds 1. Fish Oil CAPS;  Therapy: (Recorded:19Feb2010) to Recorded 2. Furosemide TABS;  Therapy: (Recorded:04Sep2013) to Recorded 3. Gabapentin 300 MG TABS;  Therapy: (Recorded:06Apr2011) to Recorded 4. Lantus SoloStar 100 UNIT/ML SOLN;  Therapy: (Recorded:29Jun2012) to Recorded 5. Lisinopril 40 MG Oral Tablet;  Therapy: (Recorded:19Feb2010) to Recorded 6. MetFORMIN HCl - 500 MG Oral Tablet;  Therapy: (Recorded:06Apr2011) to Recorded 7. Multivitamins TABS;  Therapy: (Recorded:19Feb2010) to Recorded 8. NovoLOG 100 UNIT/ML Subcutaneous Solution;  Therapy: (Recorded:07Dec2011) to Recorded 9. Omeprazole 40 MG Oral Capsule Delayed Release;  Therapy: (Recorded:19Feb2010) to Recorded 10. Pramipexole Dihydrochloride 0.5 MG Oral Tablet;   Therapy: (Recorded:04Sep2013) to Recorded 11. Propranolol HCl - 40 MG Oral Tablet;   Therapy: (Recorded:09Jan2013) to Recorded 12. Simvastatin 20 MG Oral Tablet;   Therapy: (Recorded:06Apr2011) to Recorded 13. TraZODone HCl - 50 MG Oral Tablet;   Therapy: (Recorded:19Feb2010) to Recorded  Allergies Medication  1. NSAIDs 2. Tylenol TABS  Family History Problems  1. Denied: Family history of Bladder Cancer 2. Family history of hepatic cirrhosis (Z83.79) : Father 3. Denied: Family history of Kidney Cancer 4. Denied: Family history of Prostate Cancer  Social History Problems  1. Denied: History of Alcohol Use 2. Current every day smoker (F17.200) 3. Marital History - Currently Married   separated, but wife is with him 4. Tobacco Use  1.5 ppd x 30 years  Review of Systems  Genitourinary: no  hematuria.  Constitutional: no recent weight loss.  Cardiovascular: no chest pain and no leg swelling.  Respiratory: no shortness of breath and no cough.    Physical Exam Constitutional: Well nourished and well developed . No acute distress.   ENT:. The ears and nose are normal in appearance.   Neck: The appearance of the neck is normal and no neck mass is present.   Pulmonary: No respiratory distress, normal respiratory rhythm and effort and clear bilateral breath sounds.   Cardiovascular: Heart rate and rhythm are normal . No peripheral edema.   Abdomen: Incision site(s) well healed. The abdomen is soft and nontender. No masses are palpated. No CVA tenderness. No hernias are palpable. No hepatosplenomegaly noted.   Genitourinary: Examination of the penis demonstrates phimosis, but a normal meatus.   Skin: Normal skin turgor, no visible rash and no visible skin lesions.   Neuro/Psych:. Mood and affect are appropriate.     Assessment Assessed  1. Malignant neoplasm of overlapping sites of bladder (C67.8) 2. Phimosis (N47.1)    Discussion/Summary 1. Urothelial carcinoma of the bladder: He does appear to have recurrence on cystoscopy today near the right ureteral orifice. I have recommended that we proceed with cystoscopy, bilateral retrograde pyelography, transurethral resection of bladder tumor, and possible right ureteral stent placement. He understands the possibility of proceeding with mitomycin-C postoperatively as well although I will defer this if he has a ureteral stent placed. We have reviewed the potential risks, complications, and expected recovery process. He understands the additional risks based on his overall medical condition.

## 2015-03-04 MED ORDER — DEXTROSE 5 % IV SOLN
3.0000 g | INTRAVENOUS | Status: DC
  Filled 2015-03-04: qty 3000

## 2015-03-06 ENCOUNTER — Other Ambulatory Visit (HOSPITAL_COMMUNITY): Payer: Self-pay | Admitting: Oncology

## 2015-03-06 DIAGNOSIS — G4700 Insomnia, unspecified: Secondary | ICD-10-CM

## 2015-03-06 MED ORDER — ZOLPIDEM TARTRATE 5 MG PO TABS: 5.0000 mg | ORAL_TABLET | Freq: Every evening | ORAL | Status: DC | PRN

## 2015-03-07 ENCOUNTER — Encounter (HOSPITAL_COMMUNITY): Payer: Medicaid Other | Attending: Hematology and Oncology | Admitting: Oncology

## 2015-03-07 ENCOUNTER — Other Ambulatory Visit: Payer: Self-pay | Admitting: Urology

## 2015-03-07 ENCOUNTER — Encounter (HOSPITAL_COMMUNITY): Payer: Self-pay | Admitting: *Deleted

## 2015-03-07 ENCOUNTER — Encounter (HOSPITAL_COMMUNITY): Payer: Self-pay | Admitting: Oncology

## 2015-03-07 ENCOUNTER — Encounter (HOSPITAL_BASED_OUTPATIENT_CLINIC_OR_DEPARTMENT_OTHER): Payer: Medicaid Other

## 2015-03-07 VITALS — BP 127/69 | HR 77 | Temp 98.0°F | Resp 18 | Wt 277.0 lb

## 2015-03-07 DIAGNOSIS — D500 Iron deficiency anemia secondary to blood loss (chronic): Secondary | ICD-10-CM

## 2015-03-07 DIAGNOSIS — I8500 Esophageal varices without bleeding: Secondary | ICD-10-CM | POA: Diagnosis not present

## 2015-03-07 DIAGNOSIS — C676 Malignant neoplasm of ureteric orifice: Secondary | ICD-10-CM

## 2015-03-07 DIAGNOSIS — D6959 Other secondary thrombocytopenia: Secondary | ICD-10-CM | POA: Insufficient documentation

## 2015-03-07 DIAGNOSIS — M899 Disorder of bone, unspecified: Secondary | ICD-10-CM

## 2015-03-07 DIAGNOSIS — K7581 Nonalcoholic steatohepatitis (NASH): Secondary | ICD-10-CM | POA: Diagnosis not present

## 2015-03-07 DIAGNOSIS — D509 Iron deficiency anemia, unspecified: Secondary | ICD-10-CM | POA: Diagnosis present

## 2015-03-07 LAB — CBC WITH DIFFERENTIAL/PLATELET
BASOS ABS: 0 10*3/uL (ref 0.0–0.1)
BASOS PCT: 0 % (ref 0–1)
EOS PCT: 2 % (ref 0–5)
Eosinophils Absolute: 0.1 10*3/uL (ref 0.0–0.7)
HCT: 39.5 % (ref 39.0–52.0)
HEMOGLOBIN: 13.9 g/dL (ref 13.0–17.0)
Lymphocytes Relative: 26 % (ref 12–46)
Lymphs Abs: 1.5 10*3/uL (ref 0.7–4.0)
MCH: 35.8 pg — ABNORMAL HIGH (ref 26.0–34.0)
MCHC: 35.2 g/dL (ref 30.0–36.0)
MCV: 101.8 fL — ABNORMAL HIGH (ref 78.0–100.0)
MONOS PCT: 12 % (ref 3–12)
Monocytes Absolute: 0.7 10*3/uL (ref 0.1–1.0)
NEUTROS PCT: 60 % (ref 43–77)
Neutro Abs: 3.4 10*3/uL (ref 1.7–7.7)
Platelets: 60 10*3/uL — ABNORMAL LOW (ref 150–400)
RBC: 3.88 MIL/uL — ABNORMAL LOW (ref 4.22–5.81)
RDW: 14.2 % (ref 11.5–15.5)
Smear Review: DECREASED
WBC: 5.7 10*3/uL (ref 4.0–10.5)

## 2015-03-07 LAB — FERRITIN: FERRITIN: 966 ng/mL — AB (ref 22–322)

## 2015-03-07 NOTE — Assessment & Plan Note (Signed)
Will update labs today with CBC diff, iron/TIBC, ferritin.  We are prepared to administer IV iron when indicated.  Will repeat labs in 3 months and 6 months.

## 2015-03-07 NOTE — Assessment & Plan Note (Addendum)
Recurrence noted on recent cystoscopy on 03/02/2015 by Dr. Alinda Money.  According to his note, he appreciated a 2 cm right ureteral orifice lesion measuring 2 cm.   Dr. Alinda Money is planning a cystoscopy, bilateral retrograde pyelography, transurethral resection of bladder tumor, and possible right ureteral stent placement.  I suspect following resection and pathology review, he will refer the patient back to medical oncology if needed at [facility].    Interestingly, Dr. 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  I reviewed this information with Dr. Alinda Money, Dr. Kathyrn Sheriff, and Dr. Thornton Papas.    I will start with a bone scan to evaluate for other osseous disease that may be more amenable to biopsy since the technicality of biopsy of this lesion is reported.  If this is negative, I will then pursue a CT scan of MRI to help IR decide on best route of biopsy of this lesion.    Return in 3 weeks for follow-up and I will perform work-up behind the scenes and re-convene in 3 weeks for review of data.  Additionally, when he returns, I will perform a PSA, depending on work-up.

## 2015-03-07 NOTE — Progress Notes (Signed)
Labs drawn

## 2015-03-07 NOTE — Progress Notes (Signed)
Called requested order be put back in Epic for same day surgery 03-12-15 Thanks

## 2015-03-07 NOTE — Patient Instructions (Signed)
[place] at [facility] Discharge Instructions  RECOMMENDATIONS MADE BY THE CONSULTANT AND ANY TEST RESULTS WILL BE SENT TO YOUR REFERRING PHYSICIAN.  Exam and discussion by Robynn Pane, PA-C Will talk with your neurosurgeon and your urologist before we do anything else.  Will see you back in 2 - 3 weeks.  This may change based upon conversations with your other physicians.  Thank you for choosing [place] at [facility] to provide your oncology and hematology care.  To afford each patient quality time with our provider, please arrive at least 15 minutes before your scheduled appointment time.    You need to re-schedule your appointment should you arrive 10 or more minutes late.  We strive to give you quality time with our providers, and arriving late affects you and other patients whose appointments are after yours.  Also, if you no show three or more times for appointments you may be dismissed from the clinic at the providers discretion.     Again, thank you for choosing [facility].  Our hope is that these requests will decrease the amount of time that you wait before being seen by our physicians.       _____________________________________________________________  Should you have questions after your visit to [facility], please contact our office at [phone] between the hours of 8:30 a.m. and 4:30 p.m.  Voicemails left after 4:30 p.m. will not be returned until the following business day.  For prescription refill requests, have your pharmacy contact our office.

## 2015-03-07 NOTE — Progress Notes (Signed)
Kyle Juba, PA-C [address] [place] [address]  Iron deficiency anemia due to chronic blood loss - Plan: CBC with Differential, Iron and TIBC, Ferritin  Malignant neoplasm of ureteric orifice  Lesion of vertebra - Plan: PSA  CURRENT THERAPY: Intermittent iron infusions last given on 10/13/2014 (510 mg Feraheme).  INTERVAL HISTORY: Kyle [name] 59 y.o. 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  GI issues followed by Dr. Gala Romney.  Chart reviewed.  Urothelial carcinoma of bladder recurrence is noted on Dr. Wynetta Emery Borden's note on 03/02/2015 following cystoscopy revealing a 2 cm lesion at the right ureteral orifice.  Dr. Alinda Money is planning a cystoscopy, bilateral retrograde pyelography, transurethral resection of bladder tumor, and possible right ureteral stent placement.  I suspect following resection and pathology review, he will refer the patient back to medical oncology if needed at [facility].   I personally reviewed and went over laboratory results with the patient.  The results are noted within this dictation.  I personally reviewed and went over radiographic studies with the patient.  The results are noted within this dictation.  I do not have any notes from Dr. Kathyrn Sheriff, but on review of the patient's chart, he had an MRI of L spine in March 2016 and from an oncology perspective there was a right L3 23 mm sclerotic vertebral body lesion with the differential including an atypical hemangioma or sclerotic metastasis, given the patient's history.  I have reviewed the patient's case with Dr. Alinda Money and he will pursue surgical resection of bladder cancer as planned. This will not interfere with my work-up of the L3 sclerotic lesion.  Additionally, I discussed the patient's case with Dr. Kathyrn Sheriff.  He too agrees that work-up is  necessary given his recurrence for urothelial ca.  I then proceeded to discuss the case with Dr. Thornton Papas (Radiology).  Biopsy of this lesion is technically difficult, according to IR.  Therefore, we will start with a bone scan.  If this is negative, I will pursue CT imaging of L-spine to better demonstrate lesion and evaluate for biopsy technique.    Unfortunately, the patient's brother passed away recently and the services are upcoming.  This information and news was tough for the patient and his wife.    I think his back pain is secondary to disc protrusion and this lesion is unlikely to be causing his symptoms.   Oncologically, he denies any other complaints and ROS questioning is negative.   Past Medical History  Diagnosis Date  . DM (diabetes mellitus)   . Cirrhosis     bx proven steatohepatitis with cirrhosis (2010); per note from Feb 2011, received Hep A and B vaccines in 2010.  [name] HTN (hypertension)   . RLS (restless legs syndrome)   . Hyperlipidemia   . IDA (iron deficiency anemia)   . GERD (gastroesophageal reflux disease)   . Depression   . Peripheral neuropathy   . Urothelial cancer     2010, paillary low-grade, h/o recurrence 2011  . B12 deficiency   . Psoriasis   . 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 Anemia due to multiple mechanisms 05/13/2011  . History of alcohol abuse 05/13/2011  . ANEMIA-IRON DEFICIENCY 03/09/2009  . S/P endoscopy May 2012    4 columns grade II esophageal varices; due for repeat in Nov 2013   .  S/P colonoscopy May 2012    Tubular adenoma  . Low back pain   . Cancer     bladder ca 05/2009 removal and  w/chemo wash  . Esophageal varices with bleeding(456.0) 11/24/11    s/p emergent EGD 11/25/11 by Dr. Hilarie Fredrickson at [facility], Grade III esophageal varices s/p banding X 5  . Small bowel obstruction 02/15/2012    Admitted to [facility], managed by Dr. Geroge Baseman, ventral hernia manually reduced  . Ventral hernia 02/15/12  . MRSA (methicillin resistant  Staphylococcus aureus)   . Acute post-ligation esophageal ulcer with hemorrhage 04/11/2012  . Clostridium difficile infection   . H. pylori infection 12/24/2012  . Sleep apnea     [facility] took him off CPAP when had tips procedure  . Sleep apnea   . Cellulitis 04/2014    right upper leg  . Iron deficiency anemia secondary to blood loss (chronic) 03/09/2009    Secondary to GI blood loss- Rectal and esophageal varices, portal gastropathy, and esophageal ulcers      has Malignant neoplasm of bladder; DIABETES MELLITUS, TYPE II, CONTROLLED, WITH COMPLICATIONS; ADRENAL MASS; HYPERLIPIDEMIA; Iron deficiency anemia due to chronic blood loss; ANEMIA, VITAMIN B12 DEFICIENCY; SMOKER; DEPRESSION; SLEEP APNEA, OBSTRUCTIVE, MODERATE; RESTLESS LEG SYNDROME; HYPERTENSION; GERD; HEPATIC CIRRHOSIS; Other chronic nonalcoholic liver disease; ARTHRITIS; SHOULDER PAIN, LEFT; INSOMNIA; FATIGUE; WEIGHT GAIN; CHEST DISCOMFORT; PROTEINURIA; ABNORMAL ELECTROCARDIOGRAM; Thrombocytopenia due to hypersplenism; Anemia due to multiple mechanisms; History of alcohol abuse; Abdominal pain; External hemorrhoid; Upper GI bleed; Hypotension; Diarrhea; Esophageal varices; Umbilical hernia; History of small bowel obstruction; Acute post-ligation esophageal ulcer with hemorrhage; Acute posthemorrhagic anemia; Acute respiratory failure; UTI (urinary tract infection); Hyperkalemia; H. pylori infection; Microscopic hematuria; Hemorrhagic shock; Respiratory failure with hypercapnia; Aspiration pneumonia; SIRS (systemic inflammatory response syndrome); Seizure; Fever, unspecified; Chronic kidney disease, stage II (mild); Periumbilical abdominal pain; and Diabetic eye exam on his problem list.     is allergic to aspirin; ibuprofen; and tylenol.  Kyle [name] had no medications administered during this visit.  Past Surgical History  Procedure Laterality Date  . Carpel tunnel Right   . Shoulder surgery Left   . Adrenal mass surgery  05/2009     benign, left  . Bladder surgery  01/2009 and 06/2010    cancer 2010, small recurrence in 06/2010  . Colonoscopy  04/2009    moderate int hemorrhoids, rare sigmoid diverticula, one mm sessile hyperplastic rectal polyp  . Esophagogastroduodenoscopy  03/2009    small hh  . Small bowel capsule endoscopy  03/2009    couple of small benign appearing erosions, nonbleeding  . Egd/tcs  08/2007    small hiatal hernia, pancolonic diverticula, friable anal canal, 3cm salmon colored epithelium in distal esophagus, bx negative for Barrett's  . Skin cancer excision  Oct 2012    left arm  . Esophagogastroduodenoscopy  11/25/2011    Dr. Phineas Douglas III varices in the mid and distal esophagus, banding placed, portal astropathy  . Esophagogastroduodenoscopy  01/14/2012    Dr Rourk->4-5 columns Gr2 varices, 6 bands placed, HH, distal esophageal ulcer, portal gastropathy, antral erosions  . Esophagogastroduodenoscopy  03/17/2012    Dr. Larina Bras varices status post band ligation. Hiatal hernia. Portal gastropathy.  . Umbilical hernia repair  04/05/2012    Procedure: HERNIA REPAIR UMBILICAL ADULT;  Surgeon: Donato Heinz, MD;  Location: [facility];  Service: General;  Laterality: N/A;  . Esophagogastroduodenoscopy  04/11/2012    Dr. Bridgett Larsson ulcer possibly at previous banding sites with stigmata     of bleeding.  Attempt at hemostasis with hemoclip and banding was not successful resulting in recurrence of bleed/  Portal gastropathy, but no evidence of gastric varices/ Recurrent esophageal varices grade 2.  . Esophagogastroduodenoscopy  05/24/2012    Dr. Larina Bras varices, portal gastropathy  . Esophagogastroduodenoscopy  04/11/12    Dr. Drue Novel in the distal esophagus adherent lot and visible vessel treated with bicap. grade I varices in the distal esophagus, one ligating band placed, portal hypertensive gastropathy in the body of the stomach.  . Colonoscopy  09/29/2012    GGY:IRSWNI varices.  Scattered pancolonic diverticula/single cecal polyp-removed as described above  . Esophagogastroduodenoscopy  12/24/2012    Dr. Lynwood Dawley significant upper GI bleed secondary to bleeding esophageal varices. s/p hemostasis therapy with injection of a total of 9mL of 5% ethanolamine and application of 9 bands, complete egd not carried out.  . Schlerotherapy  12/24/2012    Procedure: Woodward Ku OF VARICES;  Surgeon: Daneil Dolin, MD;  Location: [facility];  Service: Endoscopy;  Laterality: N/A;  . Esophageal banding  12/24/2012    OEV:[id number] significant upper GI bleed secondary to bleeding esophageal varices  . Tips procedure  12/2012    [facility]  . Cataract extraction, bilateral    . Hernia repair      umbilical  . Eye surgery      bilateral cataract surgery with lens implants    Denies any headaches, dizziness, double vision, fevers, chills, night sweats, nausea, vomiting, diarrhea, constipation, chest pain, heart palpitations, shortness of breath, blood in stool, black tarry stool, urinary pain, urinary burning, urinary frequency, hematuria.   PHYSICAL EXAMINATION  ECOG PERFORMANCE STATUS: 1 - Symptomatic but completely ambulatory  Filed Vitals:   03/07/15 0950  BP: 127/69  Pulse: 77  Temp: 98 F (36.7 C)  Resp: 18    GENERAL:alert, no distress, cooperative, obese, smiling and accompanied by his wife, fatigued appearing. SKIN: skin color, texture, turgor are normal, no rashes or significant lesions HEAD: Normocephalic, No masses, lesions, tenderness or abnormalities EYES: normal, PERRLA, EOMI, Conjunctiva are pink and non-injected EARS: External ears normal OROPHARYNX:lips, buccal mucosa, and tongue normal and mucous membranes are moist  NECK: supple, trachea midline LYMPH:  not examined BREAST:not examined LUNGS: clear to auscultation , decreased breath sounds HEART: regular rate & rhythm ABDOMEN:abdomen soft, non-tender, obese and normal bowel  sounds BACK: Back symmetric, no curvature., No CVA tenderness EXTREMITIES:less then 2 second capillary refill, no joint deformities, effusion, or inflammation, no edema, no skin discoloration, no cyanosis  NEURO: alert & oriented x 3 with fluent speech, no focal motor/sensory deficits, positive findings: limp gait     LABORATORY DATA: CBC    Component Value Date/Time   WBC 5.7 03/07/2015 0934   RBC 3.88* 03/07/2015 0934   RBC 3.97* 12/18/2010 1235   HGB 13.9 03/07/2015 0934   HCT 39.5 03/07/2015 0934   PLT 60* 03/07/2015 0934   MCV 101.8* 03/07/2015 0934   MCH 35.8* 03/07/2015 0934   MCHC 35.2 03/07/2015 0934   RDW 14.2 03/07/2015 0934   LYMPHSABS 1.5 03/07/2015 0934   MONOABS 0.7 03/07/2015 0934   EOSABS 0.1 03/07/2015 0934   BASOSABS 0.0 03/07/2015 0934      Chemistry      Component Value Date/Time   NA 139 02/23/2015 0930   K 4.8 02/23/2015 0930   CL 102 02/23/2015 0930   CO2 29 02/23/2015 0930   BUN 21 02/23/2015 0930   CREATININE 1.25 02/23/2015 0930   CREATININE 1.11 07/07/2014 0834  Component Value Date/Time   CALCIUM 9.4 02/23/2015 0930   ALKPHOS 158* 02/23/2015 0930   AST 87* 02/23/2015 0930   ALT 33 02/23/2015 0930   BILITOT 5.0* 02/23/2015 0930     Lab Results  Component Value Date   IRON 111 08/19/2013   TIBC 295 08/19/2013   FERRITIN 7031* 01/09/2015     RADIOGRAPHIC STUDIES:  Dg Chest 2 View  02/23/2015   CLINICAL DATA:  Preop for trans urethral resection of bladder tumor  EXAM: CHEST  2 VIEW  COMPARISON:  11/04/2014  FINDINGS: Chronic prominence of pulmonary vessels. No edema or effusion. No pneumonia. Normal heart size and aortic contours. Incidental bilateral cervical ribs.  IMPRESSION: Stable exam.  No acute cardiopulmonary disease.   Electronically Signed   By: Monte Fantasia M.D.   On: 02/23/2015 09:56   Mr Lumbar Spine Wo Contrast  02/20/2015   CLINICAL DATA:  Lumbago/low back pain. No history of surgery. Personal history of bladder  cancer. lumbago/low back pain with numbness across the low back. Spondylosis of lumbar region without myelopathy or radiculopathy  EXAM: MRI LUMBAR SPINE WITHOUT CONTRAST  TECHNIQUE: Multiplanar, multisequence MR imaging of the lumbar spine was performed. No intravenous contrast was administered.  COMPARISON:  Radiographs 11/30/2014.  CT 01/25/2014.  FINDINGS: Segmentation: The numbering convention used for this exam termed L5-S1 as the last intervertebral disc space.  Alignment: Grade I anterolisthesis of L5 on S1 measures 2 mm to 3 mm. This is secondary to chronic bilateral L5 pars defects, better seen on the prior CT than on today's MRI. Mild levoconvex curve of the lumbar spine is present. The apex of the curvature is at L3-L4.  Vertebrae: Sclerotic osseous lesion is present in the RIGHT posterior inferior corner of the L3 vertebra. This measures 23 mm long axis. Although there was some bony distortion on prior CT from 2015, it no definite densely sclerotic lesion was present in this location. Primary differential considerations are metastatic disease or an atypical hemangioma.  Bone marrow signal shows suppression of normal fatty marrow. This is a nonspecific finding most commonly associated with obesity, anemia, cigarette smoking or chronic disease. Vertebral body height preserved.  Conus medullaris: Normal at L1-L2.  Paraspinal tissues: Hepatic cirrhosis is present. Varices are present in the LEFT superior retroperitoneum compatible with portal venous hypertension.  Disc levels:  Disc Signal: Disk desiccation most pronounced from L3-L4 through L5-S1.  T12-L1:  Negative.  L1-L2:  Small central disc protrusion without resulting stenosis.  L2-L3: Mild RIGHT facet hypertrophy. Small LEFT central annular fissure. No resulting stenosis.  L3-L4: Disc desiccation with a RIGHT foraminal protrusion. The protrusion extends from the RIGHT central region to the RIGHT lateral region and is centered in the RIGHT foramen.  Associated annular fissure. There is mild RIGHT foraminal stenosis potentially affecting the RIGHT L3 nerve. The LEFT neural foramen appears patent. Subarticular zones and central canal adequately patent.  L4-L5: Small central disc protrusion. Disc desiccation and loss of height. LEFT-greater-than- RIGHT facet arthrosis. Central canal, subarticular zones and neural foramina appear patent.  L5-S1: Severe disc degeneration with vacuum disc. Endplate osteophytes. Mild LEFT foraminal stenosis associated with anterolisthesis and disc degeneration. RIGHT foramen appears adequately patent.  IMPRESSION: 1. Indeterminate RIGHT L3 23 mm sclerotic vertebral body lesion. Primary differential considerations are atypical hemangioma or sclerotic metastasis in this patient with a history of malignancy. For further evaluation, post gadolinium imaging of the lumbar spine is recommended as the first step. A noncontrast CT (allowing comparison to 01/25/2014)  and a whole-body bone scan could potentially be useful depending on the findings on the contrast-enhanced lumbar spine MRI. 2. Moderate lumbar spondylosis, most pronounced at L3-L4. Broad-based protrusion of the L3-L4 disc centered in the RIGHT foraminal region produces mild RIGHT foraminal stenosis the could affect the RIGHT L3 nerve.   Electronically Signed   By: Dereck Ligas M.D.   On: 02/20/2015 15:30      ASSESSMENT AND PLAN:  Iron deficiency anemia due to chronic blood loss Will update labs today with CBC diff, iron/TIBC, ferritin.  We are prepared to administer IV iron when indicated.  Will repeat labs in 3 months and 6 months.   Malignant neoplasm of bladder Recurrence noted on recent cystoscopy on 03/02/2015 by Dr. Alinda Money.  According to his note, he appreciated a 2 cm right ureteral orifice lesion measuring 2 cm.   Dr. Alinda Money is planning a cystoscopy, bilateral retrograde pyelography, transurethral resection of bladder tumor, and possible right ureteral stent  placement.  I suspect following resection and pathology review, he will refer the patient back to medical oncology if needed at [facility].    Interestingly, Dr. 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  I reviewed this information with Dr. Alinda Money, Dr. Kathyrn Sheriff, and Dr. Thornton Papas.    I will start with a bone scan to evaluate for other osseous disease that may be more amenable to biopsy since the technicality of biopsy of this lesion is reported.  If this is negative, I will then pursue a CT scan of MRI to help IR decide on best route of biopsy of this lesion.    Return in 3 weeks for follow-up and I will perform work-up behind the scenes and re-convene in 3 weeks for review of data.  Additionally, when he returns, I will perform a PSA, depending on work-up.      THERAPY PLAN:  I will work-up the sclerotic lesion at L3 as outlined above.  All questions were answered. The patient knows to call the clinic with any problems, questions or concerns. We can certainly see the patient much sooner if necessary.  Patient and plan discussed with Dr. Ancil Linsey and she is in agreement with the aforementioned.   This note is electronically signed by: Robynn Pane 03/07/2015 3:41 PM

## 2015-03-11 MED ORDER — DEXTROSE 5 % IV SOLN
3.0000 g | INTRAVENOUS | Status: AC
  Administered 2015-03-12: 3 g via INTRAVENOUS
  Filled 2015-03-11: qty 3000

## 2015-03-12 ENCOUNTER — Ambulatory Visit (HOSPITAL_COMMUNITY): Payer: Medicaid Other | Admitting: Anesthesiology

## 2015-03-12 ENCOUNTER — Encounter (HOSPITAL_COMMUNITY): Payer: Self-pay | Admitting: *Deleted

## 2015-03-12 ENCOUNTER — Encounter (HOSPITAL_COMMUNITY)
Admission: RE | Disposition: A | Discharge status: Home / Self Care | Payer: Self-pay | Source: Ambulatory Visit | Attending: Urology

## 2015-03-12 ENCOUNTER — Ambulatory Visit (HOSPITAL_COMMUNITY)
Admission: RE | Admit: 2015-03-12 | Discharge: 2015-03-13 | Disposition: A | Discharge status: Home / Self Care | Payer: Medicaid Other | Source: Ambulatory Visit | Attending: Urology | Admitting: Urology

## 2015-03-12 DIAGNOSIS — N471 Phimosis: Secondary | ICD-10-CM | POA: Insufficient documentation

## 2015-03-12 DIAGNOSIS — Z79899 Other long term (current) drug therapy: Secondary | ICD-10-CM | POA: Insufficient documentation

## 2015-03-12 DIAGNOSIS — G4730 Sleep apnea, unspecified: Secondary | ICD-10-CM | POA: Diagnosis not present

## 2015-03-12 DIAGNOSIS — C679 Malignant neoplasm of bladder, unspecified: Secondary | ICD-10-CM | POA: Diagnosis present

## 2015-03-12 DIAGNOSIS — I10 Essential (primary) hypertension: Secondary | ICD-10-CM | POA: Insufficient documentation

## 2015-03-12 DIAGNOSIS — Z794 Long term (current) use of insulin: Secondary | ICD-10-CM | POA: Insufficient documentation

## 2015-03-12 DIAGNOSIS — R161 Splenomegaly, not elsewhere classified: Secondary | ICD-10-CM | POA: Insufficient documentation

## 2015-03-12 DIAGNOSIS — F329 Major depressive disorder, single episode, unspecified: Secondary | ICD-10-CM | POA: Diagnosis not present

## 2015-03-12 DIAGNOSIS — K219 Gastro-esophageal reflux disease without esophagitis: Secondary | ICD-10-CM | POA: Insufficient documentation

## 2015-03-12 DIAGNOSIS — K769 Liver disease, unspecified: Secondary | ICD-10-CM | POA: Diagnosis not present

## 2015-03-12 DIAGNOSIS — E785 Hyperlipidemia, unspecified: Secondary | ICD-10-CM | POA: Diagnosis not present

## 2015-03-12 DIAGNOSIS — F17200 Nicotine dependence, unspecified, uncomplicated: Secondary | ICD-10-CM | POA: Diagnosis not present

## 2015-03-12 DIAGNOSIS — E119 Type 2 diabetes mellitus without complications: Secondary | ICD-10-CM | POA: Insufficient documentation

## 2015-03-12 DIAGNOSIS — G2581 Restless legs syndrome: Secondary | ICD-10-CM | POA: Diagnosis not present

## 2015-03-12 DIAGNOSIS — D649 Anemia, unspecified: Secondary | ICD-10-CM | POA: Diagnosis not present

## 2015-03-12 DIAGNOSIS — D696 Thrombocytopenia, unspecified: Secondary | ICD-10-CM | POA: Insufficient documentation

## 2015-03-12 DIAGNOSIS — C674 Malignant neoplasm of posterior wall of bladder: Secondary | ICD-10-CM | POA: Diagnosis not present

## 2015-03-12 HISTORY — PX: TRANSURETHRAL RESECTION OF BLADDER TUMOR: SHX2575

## 2015-03-12 HISTORY — PX: CIRCUMCISION: SHX1350

## 2015-03-12 HISTORY — PX: CYSTOSCOPY W/ URETERAL STENT PLACEMENT: SHX1429

## 2015-03-12 LAB — COMPREHENSIVE METABOLIC PANEL
ALBUMIN: 2.4 g/dL — AB (ref 3.5–5.2)
ALT: 43 U/L (ref 0–53)
AST: 90 U/L — AB (ref 0–37)
Alkaline Phosphatase: 133 U/L — ABNORMAL HIGH (ref 39–117)
Anion gap: 10 (ref 5–15)
BUN: 17 mg/dL (ref 6–23)
CHLORIDE: 103 mmol/L (ref 96–112)
CO2: 24 mmol/L (ref 19–32)
Calcium: 8.5 mg/dL (ref 8.4–10.5)
Creatinine, Ser: 1.19 mg/dL (ref 0.50–1.35)
GFR calc Af Amer: 77 mL/min — ABNORMAL LOW (ref >90)
GFR calc non Af Amer: 66 mL/min — ABNORMAL LOW (ref >90)
Glucose, Bld: 182 mg/dL — ABNORMAL HIGH (ref 70–99)
POTASSIUM: 4.4 mmol/L (ref 3.5–5.1)
Sodium: 137 mmol/L (ref 135–145)
Total Bilirubin: 3.7 mg/dL — ABNORMAL HIGH (ref 0.3–1.2)
Total Protein: 6.9 g/dL (ref 6.0–8.3)

## 2015-03-12 LAB — GLUCOSE, CAPILLARY
GLUCOSE-CAPILLARY: 156 mg/dL — AB (ref 70–99)
Glucose-Capillary: 166 mg/dL — ABNORMAL HIGH (ref 70–99)
Glucose-Capillary: 173 mg/dL — ABNORMAL HIGH (ref 70–99)

## 2015-03-12 LAB — PROTIME-INR
INR: 1.52 — ABNORMAL HIGH (ref 0.00–1.49)
Prothrombin Time: 18.5 seconds — ABNORMAL HIGH (ref 11.6–15.2)

## 2015-03-12 LAB — PLATELET COUNT: Platelets: 53 10*3/uL — ABNORMAL LOW (ref 150–400)

## 2015-03-12 LAB — APTT: aPTT: 35 seconds (ref 24–37)

## 2015-03-12 SURGERY — TURBT (TRANSURETHRAL RESECTION OF BLADDER TUMOR)
Anesthesia: General | Site: Ureter

## 2015-03-12 MED ORDER — HYDROMORPHONE HCL 1 MG/ML IJ SOLN
0.2500 mg | INTRAMUSCULAR | Status: DC | PRN
  Administered 2015-03-12 (×2): 0.5 mg via INTRAVENOUS

## 2015-03-12 MED ORDER — SENNOSIDES-DOCUSATE SODIUM 8.6-50 MG PO TABS
2.0000 | ORAL_TABLET | Freq: Every day | ORAL | Status: DC
  Administered 2015-03-12: 2 via ORAL
  Filled 2015-03-12: qty 2

## 2015-03-12 MED ORDER — MIDAZOLAM HCL 2 MG/2ML IJ SOLN
INTRAMUSCULAR | Status: AC
  Filled 2015-03-12: qty 2

## 2015-03-12 MED ORDER — FENTANYL CITRATE (PF) 100 MCG/2ML IJ SOLN
INTRAMUSCULAR | Status: DC | PRN
  Administered 2015-03-12 (×3): 50 ug via INTRAVENOUS

## 2015-03-12 MED ORDER — FENTANYL CITRATE (PF) 250 MCG/5ML IJ SOLN
INTRAMUSCULAR | Status: AC
  Filled 2015-03-12: qty 5

## 2015-03-12 MED ORDER — BACITRACIN-NEOMYCIN-POLYMYXIN 400-5-5000 EX OINT: 1.0000 "application " | TOPICAL_OINTMENT | Freq: Three times a day (TID) | CUTANEOUS | Status: DC | PRN

## 2015-03-12 MED ORDER — FENTANYL CITRATE (PF) 100 MCG/2ML IJ SOLN
INTRAMUSCULAR | Status: AC
  Filled 2015-03-12: qty 2

## 2015-03-12 MED ORDER — OXYCODONE HCL 5 MG PO TABS
5.0000 mg | ORAL_TABLET | ORAL | Status: DC | PRN
  Administered 2015-03-12 – 2015-03-13 (×3): 5 mg via ORAL
  Filled 2015-03-12 (×3): qty 1

## 2015-03-12 MED ORDER — SODIUM CHLORIDE 0.9 % IJ SOLN: 3.0000 mL | Freq: Two times a day (BID) | INTRAMUSCULAR | Status: DC

## 2015-03-12 MED ORDER — PROPOFOL 10 MG/ML IV BOLUS
INTRAVENOUS | Status: DC | PRN
  Administered 2015-03-12: 180 mg via INTRAVENOUS

## 2015-03-12 MED ORDER — IOHEXOL 300 MG/ML  SOLN
INTRAMUSCULAR | Status: DC | PRN
  Administered 2015-03-12: 20 mL

## 2015-03-12 MED ORDER — SODIUM CHLORIDE 0.9 % IR SOLN
Status: DC | PRN
  Administered 2015-03-12: 3000 mL

## 2015-03-12 MED ORDER — PROPOFOL 10 MG/ML IV BOLUS: INTRAVENOUS | Status: DC | PRN

## 2015-03-12 MED ORDER — RIFAXIMIN 550 MG PO TABS
550.0000 mg | ORAL_TABLET | Freq: Two times a day (BID) | ORAL | Status: DC
  Administered 2015-03-12: 550 mg via ORAL
  Filled 2015-03-12 (×3): qty 1

## 2015-03-12 MED ORDER — PROPRANOLOL HCL 20 MG PO TABS
20.0000 mg | ORAL_TABLET | Freq: Two times a day (BID) | ORAL | Status: DC
  Administered 2015-03-12: 20 mg via ORAL
  Filled 2015-03-12 (×3): qty 1

## 2015-03-12 MED ORDER — PANTOPRAZOLE SODIUM 40 MG PO TBEC
80.0000 mg | DELAYED_RELEASE_TABLET | Freq: Every day | ORAL | Status: DC
  Administered 2015-03-12: 80 mg via ORAL
  Filled 2015-03-12 (×2): qty 2

## 2015-03-12 MED ORDER — SODIUM CHLORIDE 0.9 % IV SOLN: 250.0000 mL | INTRAVENOUS | Status: DC | PRN

## 2015-03-12 MED ORDER — LACTATED RINGERS IV SOLN
INTRAVENOUS | Status: DC
  Administered 2015-03-12: 17:00:00 via INTRAVENOUS
  Administered 2015-03-12: 1000 mL via INTRAVENOUS

## 2015-03-12 MED ORDER — SODIUM CHLORIDE 0.9 % IJ SOLN: 3.0000 mL | INTRAMUSCULAR | Status: DC | PRN

## 2015-03-12 MED ORDER — FUROSEMIDE 40 MG PO TABS
40.0000 mg | ORAL_TABLET | Freq: Every evening | ORAL | Status: DC
  Administered 2015-03-12: 40 mg via ORAL
  Filled 2015-03-12 (×2): qty 1

## 2015-03-12 MED ORDER — MITOMYCIN CHEMO FOR BLADDER INSTILLATION 40 MG
40.0000 mg | Freq: Once | INTRAVENOUS | Status: AC
  Administered 2015-03-12: 40 mg via INTRAVESICAL
  Filled 2015-03-12: qty 40

## 2015-03-12 MED ORDER — SPIRONOLACTONE 50 MG PO TABS: 50.0000 mg | ORAL_TABLET | Freq: Every morning | ORAL | Status: DC

## 2015-03-12 MED ORDER — ONDANSETRON HCL 4 MG/2ML IJ SOLN: 4.0000 mg | INTRAMUSCULAR | Status: DC | PRN

## 2015-03-12 MED ORDER — HYDROMORPHONE HCL 1 MG/ML IJ SOLN
INTRAMUSCULAR | Status: AC
  Filled 2015-03-12: qty 1

## 2015-03-12 MED ORDER — LIDOCAINE HCL (CARDIAC) 20 MG/ML IV SOLN
INTRAVENOUS | Status: DC | PRN
  Administered 2015-03-12: 75 mg via INTRAVENOUS

## 2015-03-12 MED ORDER — ONDANSETRON HCL 4 MG/2ML IJ SOLN
INTRAMUSCULAR | Status: DC | PRN
  Administered 2015-03-12: 4 mg via INTRAVENOUS

## 2015-03-12 MED ORDER — MIDAZOLAM HCL 5 MG/5ML IJ SOLN
INTRAMUSCULAR | Status: DC | PRN
  Administered 2015-03-12 (×2): 1 mg via INTRAVENOUS

## 2015-03-12 MED ORDER — GABAPENTIN 300 MG PO CAPS
300.0000 mg | ORAL_CAPSULE | Freq: Three times a day (TID) | ORAL | Status: DC
  Administered 2015-03-12: 300 mg via ORAL
  Filled 2015-03-12 (×4): qty 1

## 2015-03-12 MED ORDER — ONDANSETRON HCL 4 MG/2ML IJ SOLN
INTRAMUSCULAR | Status: AC
  Filled 2015-03-12: qty 2

## 2015-03-12 MED ORDER — LACTULOSE 10 GM/15ML PO SOLN
10.0000 g | Freq: Every day | ORAL | Status: DC
Start: 2015-03-12 — End: 2015-03-12
  Filled 2015-03-12 (×2): qty 15

## 2015-03-12 MED ORDER — 0.9 % SODIUM CHLORIDE (POUR BTL) OPTIME
TOPICAL | Status: DC | PRN
  Administered 2015-03-12: 1000 mL

## 2015-03-12 MED ORDER — FENTANYL CITRATE (PF) 100 MCG/2ML IJ SOLN
25.0000 ug | INTRAMUSCULAR | Status: DC | PRN
  Administered 2015-03-12: 50 ug via INTRAVENOUS
  Administered 2015-03-12 (×2): 25 ug via INTRAVENOUS

## 2015-03-12 MED ORDER — BUPIVACAINE HCL (PF) 0.25 % IJ SOLN
INTRAMUSCULAR | Status: DC | PRN
  Administered 2015-03-12: 10 mL

## 2015-03-12 MED ORDER — INSULIN REGULAR HUMAN (CONC) 500 UNIT/ML ~~LOC~~ SOLN: 60.0000 [IU] | Freq: Three times a day (TID) | SUBCUTANEOUS | Status: DC

## 2015-03-12 MED ORDER — PHENYLEPHRINE HCL 10 MG/ML IJ SOLN
INTRAMUSCULAR | Status: DC | PRN
  Administered 2015-03-12: 40 ug via INTRAVENOUS
  Administered 2015-03-12: 80 ug via INTRAVENOUS
  Administered 2015-03-12 (×7): 40 ug via INTRAVENOUS

## 2015-03-12 MED ORDER — PROPOFOL 10 MG/ML IV BOLUS
INTRAVENOUS | Status: AC
  Filled 2015-03-12: qty 20

## 2015-03-12 MED ORDER — OXYBUTYNIN CHLORIDE 5 MG PO TABS: 5.0000 mg | ORAL_TABLET | Freq: Three times a day (TID) | ORAL | Status: DC | PRN

## 2015-03-12 MED ORDER — MEPERIDINE HCL 50 MG/ML IJ SOLN: 6.2500 mg | INTRAMUSCULAR | Status: DC | PRN

## 2015-03-12 MED ORDER — INSULIN ASPART 100 UNIT/ML ~~LOC~~ SOLN
60.0000 [IU] | Freq: Three times a day (TID) | SUBCUTANEOUS | Status: DC
  Administered 2015-03-13: 60 [IU] via SUBCUTANEOUS

## 2015-03-12 MED ORDER — OXYBUTYNIN CHLORIDE 5 MG PO TABS
5.0000 mg | ORAL_TABLET | Freq: Three times a day (TID) | ORAL | Status: DC | PRN
  Administered 2015-03-12: 5 mg via ORAL
  Filled 2015-03-12: qty 1

## 2015-03-12 MED ORDER — SPIRONOLACTONE 50 MG PO TABS
50.0000 mg | ORAL_TABLET | Freq: Every day | ORAL | Status: DC
  Administered 2015-03-12: 50 mg via ORAL
  Filled 2015-03-12 (×2): qty 1

## 2015-03-12 MED ORDER — OXYCODONE HCL 5 MG PO TABS: 5.0000 mg | ORAL_TABLET | ORAL | Status: DC | PRN

## 2015-03-12 MED ORDER — ONDANSETRON HCL 4 MG/2ML IJ SOLN: 4.0000 mg | Freq: Once | INTRAMUSCULAR | Status: DC | PRN

## 2015-03-12 MED ORDER — ZOLPIDEM TARTRATE 5 MG PO TABS: 5.0000 mg | ORAL_TABLET | Freq: Every evening | ORAL | Status: DC | PRN

## 2015-03-12 MED ORDER — LACTULOSE 10 GM/15ML PO SOLN
10.0000 g | Freq: Every day | ORAL | Status: DC
  Filled 2015-03-12: qty 15

## 2015-03-12 MED ORDER — LIDOCAINE HCL (CARDIAC) 20 MG/ML IV SOLN
INTRAVENOUS | Status: AC
  Filled 2015-03-12: qty 5

## 2015-03-12 SURGICAL SUPPLY — 37 items
BAG URINE DRAINAGE (UROLOGICAL SUPPLIES) ×5 IMPLANT
BAG URO CATCHER STRL LF (DRAPE) ×5 IMPLANT
BLADE SURG 15 STRL LF DISP TIS (BLADE) ×3 IMPLANT
BLADE SURG 15 STRL SS (BLADE) ×2
BNDG COHESIVE 1X5 TAN STRL LF (GAUZE/BANDAGES/DRESSINGS) ×5 IMPLANT
BNDG CONFORM 2 STRL LF (GAUZE/BANDAGES/DRESSINGS) ×5 IMPLANT
CATH FOLEY 2WAY SLVR 18FR 30CC (CATHETERS) ×5 IMPLANT
CATH INTERMIT  6FR 70CM (CATHETERS) ×5 IMPLANT
COVER SURGICAL LIGHT HANDLE (MISCELLANEOUS) ×5 IMPLANT
DRSG ADAPTIC 3X8 NADH LF (GAUZE/BANDAGES/DRESSINGS) ×5 IMPLANT
ELECT REM PT RETURN 9FT ADLT (ELECTROSURGICAL) ×5
ELECTRODE REM PT RTRN 9FT ADLT (ELECTROSURGICAL) ×3 IMPLANT
EVACUATOR MICROVAS BLADDER (UROLOGICAL SUPPLIES) IMPLANT
GAUZE PETROLATUM 1 X8 (GAUZE/BANDAGES/DRESSINGS) ×5 IMPLANT
GAUZE SPONGE 4X4 16PLY XRAY LF (GAUZE/BANDAGES/DRESSINGS) ×5 IMPLANT
GLOVE BIOGEL M STRL SZ7.5 (GLOVE) ×5 IMPLANT
GOWN STRL REUS W/TWL LRG LVL3 (GOWN DISPOSABLE) ×10 IMPLANT
GUIDEWIRE STR DUAL SENSOR (WIRE) ×5 IMPLANT
KIT ASPIRATION TUBING (SET/KITS/TRAYS/PACK) IMPLANT
KIT BASIN OR (CUSTOM PROCEDURE TRAY) IMPLANT
LOOP CUT BIPOLAR 24F LRG (ELECTROSURGICAL) ×5 IMPLANT
MANIFOLD NEPTUNE II (INSTRUMENTS) ×5 IMPLANT
NS IRRIG 1000ML POUR BTL (IV SOLUTION) IMPLANT
PACK BASIC VI WITH GOWN DISP (CUSTOM PROCEDURE TRAY) IMPLANT
PACK CYSTO (CUSTOM PROCEDURE TRAY) ×5 IMPLANT
PENCIL BUTTON HOLSTER BLD 10FT (ELECTRODE) ×5 IMPLANT
SUT CHROMIC 3 0 PS 2 (SUTURE) IMPLANT
SUT CHROMIC 3 0 SH 27 (SUTURE) ×15 IMPLANT
SUT SILK 2 0 (SUTURE)
SUT SILK 2-0 18XBRD TIE 12 (SUTURE) IMPLANT
SYR 30ML LL (SYRINGE) ×5 IMPLANT
SYR CONTROL 10ML LL (SYRINGE) ×5 IMPLANT
SYRINGE IRR TOOMEY STRL 70CC (SYRINGE) IMPLANT
TOWEL NATURAL 10PK STERILE (DISPOSABLE) ×5 IMPLANT
TUBING CONNECTING 10 (TUBING) ×4 IMPLANT
TUBING CONNECTING 10' (TUBING) ×1
WATER STERILE IRR 1500ML POUR (IV SOLUTION) IMPLANT

## 2015-03-12 NOTE — Discharge Instructions (Addendum)
1. You may see some blood in the urine and may have some burning with urination for 48-72 hours. You also may notice that you have to urinate more frequently or urgently after your procedure which is normal.  2. You should call should you develop an inability urinate, fever > 101, persistent nausea and vomiting that prevents you from eating or drinking to stay hydrated.        3.    You may remove your dressing in 48 hours.  You may begin showering in 48 hours.  You should avoid any sexual activity until your follow up appointment.

## 2015-03-12 NOTE — Anesthesia Preprocedure Evaluation (Signed)
Anesthesia Evaluation  Patient identified by MRN, date of birth, ID band Patient awake    Reviewed: Allergy & Precautions, NPO status , Patient's Chart, lab work & pertinent test results  Airway Mallampati: II  TM Distance: >3 FB Neck ROM: Full    Dental   Pulmonary sleep apnea , Current Smoker,    Pulmonary exam normal       Cardiovascular hypertension, Pt. on medications     Neuro/Psych Depression    GI/Hepatic GERD-  Medicated and Controlled,(+) Cirrhosis -  Esophageal Varices    , S/P TIPS procedure 02/2013 at [facility]   Endo/Other  diabetes, Type 2, Insulin Dependent  Renal/GU      Musculoskeletal   Abdominal   Peds  Hematology Thrombocytopenia of unknown cause ? Cirrhosis   Anesthesia Other Findings   Reproductive/Obstetrics                             Anesthesia Physical Anesthesia Plan  ASA: III  Anesthesia Plan: General   Post-op Pain Management:    Induction: Intravenous  Airway Management Planned: LMA  Additional Equipment:   Intra-op Plan:   Post-operative Plan: Extubation in OR  Informed Consent: I have reviewed the patients History and Physical, chart, labs and discussed the procedure including the risks, benefits and alternatives for the proposed anesthesia with the patient or authorized representative who has indicated his/her understanding and acceptance.     Plan Discussed with: CRNA and Surgeon  Anesthesia Plan Comments:         Anesthesia Quick Evaluation

## 2015-03-12 NOTE — Anesthesia Procedure Notes (Signed)
Procedure Name: LMA Insertion Date/Time: 03/12/2015 3:53 PM Performed by: Ofilia Neas Pre-anesthesia Checklist: Patient identified, Emergency Drugs available, Suction available, Patient being monitored and Timeout performed Patient Re-evaluated:Patient Re-evaluated prior to inductionOxygen Delivery Method: Circle system utilized Preoxygenation: Pre-oxygenation with 100% oxygen Intubation Type: IV induction LMA: LMA inserted LMA Size: 4.0 Number of attempts: 1 Placement Confirmation: positive ETCO2 and breath sounds checked- equal and bilateral Tube secured with: Tape Dental Injury: Teeth and Oropharynx as per pre-operative assessment

## 2015-03-12 NOTE — Anesthesia Preprocedure Evaluation (Deleted)
Anesthesia Evaluation  Patient identified by MRN, date of birth, ID band  Airway Mallampati: II  TM Distance: >3 FB Neck ROM: Full    Dental no notable dental hx.    Pulmonary Current Smoker,  breath sounds clear to auscultation  Pulmonary exam normal       Cardiovascular hypertension, Rhythm:Regular Rate:Normal     Neuro/Psych    GI/Hepatic   Endo/Other  diabetes  Renal/GU      Musculoskeletal   Abdominal   Peds  Hematology   Anesthesia Other Findings   Reproductive/Obstetrics                             Anesthesia Physical Anesthesia Plan Anesthesia Quick Evaluation

## 2015-03-12 NOTE — Transfer of Care (Signed)
Immediate Anesthesia Transfer of Care Note  Patient: Kyle [name]  Procedure(s) Performed: Procedure(s): TRANSURETHRAL RESECTION OF BLADDER TUMOR (TURBT) (N/A) CYSTOSCOPY WITH BILATERAL RETROGRADE PYELOGRAM/RIGHT URETEROSCOPY (Bilateral) CIRCUMCISION ADULT (N/A)  Patient Location: PACU  Anesthesia Type:General  Level of Consciousness: sedated  Airway & Oxygen Therapy: Patient Spontanous Breathing and Patient connected to face mask oxygen  Post-op Assessment: Report given to RN and Post -op Vital signs reviewed and stable  Post vital signs: Reviewed and stable  Last Vitals:  Filed Vitals:   03/12/15 1252  BP: 126/73  Pulse: 79  Temp: 36.6 C  Resp: 20    Complications: No apparent anesthesia complications

## 2015-03-12 NOTE — Anesthesia Postprocedure Evaluation (Signed)
Anesthesia Post Note  Patient: Kyle [name]  Procedure(s) Performed: Procedure(s) (LRB): TRANSURETHRAL RESECTION OF BLADDER TUMOR (TURBT) (N/A) CYSTOSCOPY WITH BILATERAL RETROGRADE PYELOGRAM/RIGHT URETEROSCOPY (Bilateral) CIRCUMCISION ADULT (N/A)  Anesthesia type: general  Patient location: PACU  Post pain: Pain level controlled  Post assessment: Patient's Cardiovascular Status Stable  Last Vitals:  Filed Vitals:   03/12/15 1800  BP: 116/68  Pulse: 78  Temp:   Resp: 19    Post vital signs: Reviewed and stable  Level of consciousness: sedated  Complications: No apparent anesthesia complications

## 2015-03-12 NOTE — Op Note (Signed)
Preoperative diagnosis:  1. Bladder cancer 2. Phimosis  Postoperative diagnosis: 1. Bladder cancer 2. Phimosis   Procedure(s): 1. Cystourethroscopy with TURBT (< 2 cm) 2. Right ureteroscopy 3. Bilateral retrograde pyelogram with interpretation 4. Doral penile block 5. Circumcision  Surgeon: Dr. Roxy Horseman, Brooke Bonito  Resident: Dr. Amaryllis Dyke  Anesthesia: General  Complications: None immediate  EBL: Minimal  Specimens:  1. Bladder tumor - right bladder base 2. Foreskin for gross only  Intraoperative findings:  1. Low grade papillary appearing tumor at the bladder base just medial to the right UO 2. RPG: Questionable filling defect in the distal right ureter. Left ureter was normal. No hydro bilaterally. 3. Right dx URS - normal at location of filling defect 4. Phimotic foreskin requiring dorsal slit to retract.   Indication: 81M with bladder cancer and phimosis who has been consented for TURBT and circumcision. Risks, benefits and alternatives discussed. Platelets were checked pre-op and were adquate.  Description of procedure:  The patient, consent and site were verified prior to bringing them to the OR where they were placed supine on the OR table. SCDs were placed and IV antibiotics were initiated. General anesthesia was induced and the patient was placed in the dorsal lithotomy position where they were prepped and draped in the usual sterile fashion. Pre-procedure time out was called and the case was begun.  The 21Fr rigid cystoscope with 30 degree lens was inserted into the bladder per urethra with ample lubrication and normal saline running for irrigation. Complete cystoscopy was performed with the above noted findings.   Bilateral retrograde pyelograms were performed with a concern for filling defect at right distal ureter. Left side was entirely normal. No hydro bilaterally. There was some bloody efflux from the ureters after cannulation.  The tumor medial and  adjacent to the right UO was resected and the edges were fulgurated for hemostasis. The tumor was irrigated and sent for pathology.  We then proceeded with the circumcision. A dorsal nerve block was performed. We marked the outer preputial foreskin a few mm proximal to the coronal rim. A dorsal slit was performed to retract the foreskin. We marked the inner preputial foreskin in similar fashion. We incised over both marks to create the skin collar. Hemostats were placed on the 4 corners and a hemostat was placed underneath and incised dorsally with a Bovie. The skin collar was removed with Bovie taking care to avoid injury to the glans and penile shaft skin. This was passed off and sent for pathology. Hemostasis was meticulously obtained. The remaining shaft skin was reapproximated to the coronal rim with interrupted 3-0 chromic sutures. It was wrapped with vasoline gauze, regular gauze, and coban. A foley was placed. The patient was then awoken from general anesthesia having tolerated the procedure well.  In the PACU, 40mg  of mitomycin C was instilled into the patients bladder through the catheter, which was then clamped off. It was allowed to remain in the bladder for 1 hour prior to drainage and catheter removal. It was disposed of in the standard fashion.

## 2015-03-13 ENCOUNTER — Ambulatory Visit (HOSPITAL_COMMUNITY): Payer: Medicaid Other

## 2015-03-13 ENCOUNTER — Encounter (HOSPITAL_COMMUNITY): Payer: Medicaid Other

## 2015-03-13 ENCOUNTER — Encounter (HOSPITAL_COMMUNITY): Payer: Self-pay | Admitting: Urology

## 2015-03-13 DIAGNOSIS — C674 Malignant neoplasm of posterior wall of bladder: Secondary | ICD-10-CM | POA: Diagnosis not present

## 2015-03-13 LAB — HEMOGLOBIN AND HEMATOCRIT, BLOOD
HCT: 37.9 % — ABNORMAL LOW (ref 39.0–52.0)
HEMOGLOBIN: 13.1 g/dL (ref 13.0–17.0)

## 2015-03-13 LAB — BASIC METABOLIC PANEL
Anion gap: 8 (ref 5–15)
BUN: 19 mg/dL (ref 6–23)
CHLORIDE: 99 mmol/L (ref 96–112)
CO2: 28 mmol/L (ref 19–32)
Calcium: 8.8 mg/dL (ref 8.4–10.5)
Creatinine, Ser: 1.2 mg/dL (ref 0.50–1.35)
GFR calc Af Amer: 76 mL/min — ABNORMAL LOW (ref >90)
GFR calc non Af Amer: 65 mL/min — ABNORMAL LOW (ref >90)
GLUCOSE: 327 mg/dL — AB (ref 70–99)
POTASSIUM: 4.9 mmol/L (ref 3.5–5.1)
SODIUM: 135 mmol/L (ref 135–145)

## 2015-03-13 LAB — GLUCOSE, CAPILLARY: Glucose-Capillary: 298 mg/dL — ABNORMAL HIGH (ref 70–99)

## 2015-03-13 NOTE — Discharge Summary (Signed)
Date of admission: 03/12/2015  Date of discharge: 03/13/2015  Admission diagnosis:  1. Bladder cancer 2. Hematuria 3. Phimosis  Discharge diagnosis: Same  Secondary diagnoses: Thrombocytopenia  History and Physical: For full details, please see admission history and physical. Briefly, Kyle [name] is a 59 y.o. year old patient with non-muscle invasive bladder cancer and phimosis.  Hospital Course: The patient underwent TURBT, bilateral retrograde pyelogram, and circumcision on 03/12/2015. They tolerated the procedure well and recovered on the floor without complication. He was admitted to observation to ensure that his urine cleared given his thrombocytopenia. Their diet was advanced to regular. They were ambulatory. Their pain was controlled with PO pain meds. In the am, his urine was clear with very minimal blood tinge. They were voiding without difficulty. By POD#1 they had met all criteria for discharge.   Laboratory values:  Recent Labs  03/13/15 0410  HGB 13.1  HCT 37.9*    Disposition: Home  Discharge instruction: No sexual activity until after follow up visit with Dr. Alinda Money.   Discharge medications:  See medication reconciliation..med  Followup:  Follow-up Information    Follow up with Dutch Gray, MD.   Specialty:  Urology   Why:  03/27/15 at 10:45 am   Contact information:   [place] [address] [phone]

## 2015-03-13 NOTE — Progress Notes (Signed)
Patient discharged home with wife, discharge instructions given and explained to patient/wife and they verbalized understanding, denies any distress. Surgical insision clean/dry/intact, no sign of infection noted, Skin intact, no wound noted. Accompanied home by wife, transported to the car by staff via wheelchair.

## 2015-03-16 ENCOUNTER — Encounter (HOSPITAL_COMMUNITY)
Admission: RE | Admit: 2015-03-16 | Discharge: 2015-03-16 | Disposition: A | Discharge status: Home / Self Care | Payer: Medicaid Other | Source: Ambulatory Visit | Attending: Oncology | Admitting: Oncology

## 2015-03-16 ENCOUNTER — Encounter (HOSPITAL_COMMUNITY): Payer: Self-pay

## 2015-03-16 DIAGNOSIS — M899 Disorder of bone, unspecified: Secondary | ICD-10-CM

## 2015-03-16 MED ORDER — TECHNETIUM TC 99M MEDRONATE IV KIT
25.0000 | PACK | Freq: Once | INTRAVENOUS | Status: AC | PRN
  Administered 2015-03-16: 25 via INTRAVENOUS

## 2015-03-23 ENCOUNTER — Encounter (HOSPITAL_COMMUNITY): Payer: Self-pay | Admitting: Oncology

## 2015-03-23 ENCOUNTER — Encounter (HOSPITAL_BASED_OUTPATIENT_CLINIC_OR_DEPARTMENT_OTHER): Payer: Medicaid Other | Admitting: Oncology

## 2015-03-23 ENCOUNTER — Encounter (HOSPITAL_BASED_OUTPATIENT_CLINIC_OR_DEPARTMENT_OTHER): Payer: Medicaid Other

## 2015-03-23 VITALS — BP 109/58 | HR 67 | Temp 97.7°F | Resp 20 | Wt 292.6 lb

## 2015-03-23 DIAGNOSIS — C676 Malignant neoplasm of ureteric orifice: Secondary | ICD-10-CM | POA: Diagnosis not present

## 2015-03-23 DIAGNOSIS — M545 Low back pain: Secondary | ICD-10-CM | POA: Diagnosis not present

## 2015-03-23 DIAGNOSIS — N508 Other specified disorders of male genital organs: Secondary | ICD-10-CM

## 2015-03-23 DIAGNOSIS — C679 Malignant neoplasm of bladder, unspecified: Secondary | ICD-10-CM

## 2015-03-23 DIAGNOSIS — D500 Iron deficiency anemia secondary to blood loss (chronic): Secondary | ICD-10-CM

## 2015-03-23 DIAGNOSIS — D509 Iron deficiency anemia, unspecified: Secondary | ICD-10-CM | POA: Diagnosis not present

## 2015-03-23 DIAGNOSIS — M899 Disorder of bone, unspecified: Secondary | ICD-10-CM | POA: Diagnosis not present

## 2015-03-23 NOTE — Assessment & Plan Note (Addendum)
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 Dutch Gray on 03/12/2015 and 1 hr infusion of Mitomycin C.  MRI of L spine demonstrated a L3 sclerotic lesion in the body of the vertebra.  Given the patient bladder cancer history and recurrence, bone scan was performed and the L3 lesion (in combination with MRI findings) is favored to be benign in origin.  Oncology history is updated to reflect most recent events.  PSA today.  Today he is moderately uncomfortable with pain as expected.  He has Oxycodone at home for pain control.  He is trying to use them sparingly.  He postponed an injection for he back pain until next week while we worked up his vertebral body finding.  He is free from a Hem/Onc standpoint to pursue this intervention.

## 2015-03-23 NOTE — Assessment & Plan Note (Addendum)
Stable.  Labs in 3 months and 6 months: CBC diff, iron/TIBC, ferritin  Return in 6 months with labs.

## 2015-03-23 NOTE — Progress Notes (Signed)
Kyle Juba, PA-C [address] [place] [address]  Malignant neoplasm of ureteric orifice  Iron deficiency anemia due to chronic blood loss  Lesion of vertebra - Plan: PSA  CURRENT THERAPY: Work-up for L3 sclerotic vertebral body lesion.  Intermittent iron infusions last given on 10/13/2014 (510 mg Feraheme).  INTERVAL HISTORY: Kyle [name] 59 y.o. male returns for followup of work-up of L3 sclerotic vertebral body lesion after recurrence of bladder cancer re-resected on 03/12/2015 by Dr. Alinda Money demonstrating a low grade papillary urothelial carcinoma.  I personally reviewed and went over pathology results with the patient.  I have reviewed the Op note.  No further intervention needed from a bladder cancer standpoint.  He also underwent circumcision.  He is, as expected, having discomfort from that.  He does have pain medication at home.  I personally reviewed and went over radiographic studies with the patient.  The results are noted within this dictation.  Bone scan, with data from the MRI, strongly favors benign origin of L3 vertebral body lesion.  I will not pursue any further at this time.  Other than back pain, scrotal swelling, and discomfort from circumcision procedure, he is well.  He has urologic follow-up in the near future.  Past Medical History  Diagnosis Date  . DM (diabetes mellitus)   . Cirrhosis     bx proven steatohepatitis with cirrhosis (2010); per note from Feb 2011, received Hep A and B vaccines in 2010.  [name] HTN (hypertension)   . RLS (restless legs syndrome)   . Hyperlipidemia   . IDA (iron deficiency anemia)   . GERD (gastroesophageal reflux disease)   . Depression   . Peripheral neuropathy   . Urothelial cancer     2010, paillary low-grade, h/o recurrence 2011  . B12 deficiency   . Psoriasis   . 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 Anemia due to multiple mechanisms 05/13/2011  . History of alcohol abuse 05/13/2011   . ANEMIA-IRON DEFICIENCY 03/09/2009  . S/P endoscopy May 2012    4 columns grade II esophageal varices; due for repeat in Nov 2013   . S/P colonoscopy May 2012    Tubular adenoma  . Low back pain     possible bulging disc, bone scan   . Cancer     bladder ca 05/2009 removal and  w/chemo wash  . Esophageal varices with bleeding(456.0) 11/24/11    s/p emergent EGD 11/25/11 by Dr. Hilarie Fredrickson at [facility], Grade III esophageal varices s/p banding X 5  . Small bowel obstruction 02/15/2012    Admitted to [facility], managed by Dr. Geroge Baseman, ventral hernia manually reduced  . Ventral hernia 02/15/12  . MRSA (methicillin resistant Staphylococcus aureus)   . Acute post-ligation esophageal ulcer with hemorrhage 04/11/2012  . Clostridium difficile infection   . H. pylori infection 12/24/2012  . Sleep apnea     [facility] took him off CPAP when had tips procedure  . Sleep apnea   . Cellulitis 04/2014    right upper leg  . Iron deficiency anemia secondary to blood loss (chronic) 03/09/2009    Secondary to GI blood loss- Rectal and esophageal varices, portal gastropathy, and esophageal ulcers    . Bladder cancer     has Malignant neoplasm of bladder; DIABETES MELLITUS, TYPE II, CONTROLLED, WITH COMPLICATIONS; ADRENAL MASS; HYPERLIPIDEMIA; Iron deficiency anemia due to chronic blood loss; ANEMIA, VITAMIN B12 DEFICIENCY; SMOKER; DEPRESSION; SLEEP APNEA, OBSTRUCTIVE, MODERATE; RESTLESS LEG SYNDROME;  HYPERTENSION; GERD; HEPATIC CIRRHOSIS; Other chronic nonalcoholic liver disease; ARTHRITIS; SHOULDER PAIN, LEFT; INSOMNIA; FATIGUE; WEIGHT GAIN; CHEST DISCOMFORT; PROTEINURIA; ABNORMAL ELECTROCARDIOGRAM; Thrombocytopenia due to hypersplenism; Anemia due to multiple mechanisms; History of alcohol abuse; Abdominal pain; External hemorrhoid; Upper GI bleed; Hypotension; Diarrhea; Esophageal varices; Umbilical hernia; History of small bowel obstruction; Acute post-ligation esophageal ulcer with hemorrhage; Acute posthemorrhagic anemia; Acute  respiratory failure; UTI (urinary tract infection); Hyperkalemia; H. pylori infection; Microscopic hematuria; Hemorrhagic shock; Respiratory failure with hypercapnia; Aspiration pneumonia; SIRS (systemic inflammatory response syndrome); Seizure; Fever, unspecified; Chronic kidney disease, stage II (mild); Periumbilical abdominal pain; Diabetic eye exam; and Bladder cancer on his problem list.     is allergic to aspirin; ibuprofen; and tylenol.  [name] does not currently have medications on file.  Past Surgical History  Procedure Laterality Date  . Carpel tunnel Right   . Shoulder surgery Left   . Adrenal mass surgery  05/2009    benign, left  . Bladder surgery  01/2009 and 06/2010    cancer 2010, small recurrence in 06/2010  . Colonoscopy  04/2009    moderate int hemorrhoids, rare sigmoid diverticula, one mm sessile hyperplastic rectal polyp  . Esophagogastroduodenoscopy  03/2009    small hh  . Small bowel capsule endoscopy  03/2009    couple of small benign appearing erosions, nonbleeding  . Egd/tcs  08/2007    small hiatal hernia, pancolonic diverticula, friable anal canal, 3cm salmon colored epithelium in distal esophagus, bx negative for Barrett's  . Skin cancer excision  Oct 2012    left arm  . Esophagogastroduodenoscopy  11/25/2011    Dr. Phineas Douglas III varices in the mid and distal esophagus, banding placed, portal astropathy  . Esophagogastroduodenoscopy  01/14/2012    Dr Rourk->4-5 columns Gr2 varices, 6 bands placed, HH, distal esophageal ulcer, portal gastropathy, antral erosions  . Esophagogastroduodenoscopy  03/17/2012    Dr. Larina Bras varices status post band ligation. Hiatal hernia. Portal gastropathy.  . Umbilical hernia repair  04/05/2012    Procedure: HERNIA REPAIR UMBILICAL ADULT;  Surgeon: Donato Heinz, MD;  Location: [facility];  Service: General;  Laterality: N/A;  . Esophagogastroduodenoscopy  04/11/2012    Dr. Bridgett Larsson ulcer possibly at previous  banding sites with stigmata     of bleeding.  Attempt at hemostasis with hemoclip and banding was not successful resulting in recurrence of bleed/  Portal gastropathy, but no evidence of gastric varices/ Recurrent esophageal varices grade 2.  . Esophagogastroduodenoscopy  05/24/2012    Dr. Larina Bras varices, portal gastropathy  . Esophagogastroduodenoscopy  04/11/12    Dr. Drue Novel in the distal esophagus adherent lot and visible vessel treated with bicap. grade I varices in the distal esophagus, one ligating band placed, portal hypertensive gastropathy in the body of the stomach.  . Colonoscopy  09/29/2012    PJK:DTOIZT varices. Scattered pancolonic diverticula/single cecal polyp-removed as described above  . Esophagogastroduodenoscopy  12/24/2012    Dr. Lynwood Dawley significant upper GI bleed secondary to bleeding esophageal varices. s/p hemostasis therapy with injection of a total of 33mL of 5% ethanolamine and application of 9 bands, complete egd not carried out.  . Schlerotherapy  12/24/2012    Procedure: Woodward Ku OF VARICES;  Surgeon: Daneil Dolin, MD;  Location: [facility];  Service: Endoscopy;  Laterality: N/A;  . Esophageal banding  12/24/2012    IWP:[id number] significant upper GI bleed secondary to bleeding esophageal varices  . Tips procedure  12/2012    [facility]  . Cataract extraction, bilateral    .  Hernia repair      umbilical  . Eye surgery      bilateral cataract surgery with lens implants  . Transurethral resection of bladder tumor N/A 03/12/2015    Procedure: TRANSURETHRAL RESECTION OF BLADDER TUMOR (TURBT);  Surgeon: Raynelle Bring, MD;  Location: [facility];  Service: Urology;  Laterality: N/A;  . Cystoscopy w/ ureteral stent placement Bilateral 03/12/2015    Procedure: CYSTOSCOPY WITH BILATERAL RETROGRADE PYELOGRAM/RIGHT URETEROSCOPY;  Surgeon: Raynelle Bring, MD;  Location: [facility];  Service: Urology;  Laterality: Bilateral;  . Circumcision N/A 03/12/2015      Procedure: CIRCUMCISION ADULT;  Surgeon: Raynelle Bring, MD;  Location: [facility];  Service: Urology;  Laterality: N/A;    Denies any headaches, dizziness, double vision, fevers, chills, night sweats, nausea, vomiting, diarrhea, constipation, chest pain, heart palpitations, shortness of breath, blood in stool, black tarry stool, urinary pain, urinary burning, urinary frequency, hematuria.   PHYSICAL EXAMINATION  ECOG PERFORMANCE STATUS: 1 - Symptomatic but completely ambulatory  Filed Vitals:   03/23/15 1200  BP: 109/58  Pulse: 67  Temp: 97.7 F (36.5 C)  Resp: 20    GENERAL:alert, no distress, well nourished, well developed, comfortable, cooperative, obese and smiling, accompanied by his wife. SKIN: skin color, texture, turgor are normal, no rashes or significant lesions HEAD: Normocephalic, No masses, lesions, tenderness or abnormalities EYES: normal, PERRLA, EOMI, Conjunctiva are pink and non-injected EARS: External ears normal OROPHARYNX:lips, buccal mucosa, and tongue normal and mucous membranes are moist  NECK: supple, no adenopathy, thyroid normal size, non-tender, without nodularity, no stridor, non-tender, trachea midline LYMPH:  no palpable lymphadenopathy BREAST:not examined LUNGS: not examined HEART: not examined ABDOMEN:obese BACK: Back symmetric, no curvature. EXTREMITIES:less then 2 second capillary refill, no joint deformities, effusion, or inflammation, no skin discoloration  NEURO: alert & oriented x 3 with fluent speech    LABORATORY DATA: CBC    Component Value Date/Time   WBC 5.7 03/07/2015 0934   RBC 3.88* 03/07/2015 0934   RBC 3.97* 12/18/2010 1235   HGB 13.1 03/13/2015 0410   HCT 37.9* 03/13/2015 0410   PLT 53* 03/12/2015 1315   MCV 101.8* 03/07/2015 0934   MCH 35.8* 03/07/2015 0934   MCHC 35.2 03/07/2015 0934   RDW 14.2 03/07/2015 0934   LYMPHSABS 1.5 03/07/2015 0934   MONOABS 0.7 03/07/2015 0934   EOSABS 0.1 03/07/2015 0934   BASOSABS 0.0  03/07/2015 0934      Chemistry      Component Value Date/Time   NA 135 03/13/2015 0410   K 4.9 03/13/2015 0410   CL 99 03/13/2015 0410   CO2 28 03/13/2015 0410   BUN 19 03/13/2015 0410   CREATININE 1.20 03/13/2015 0410   CREATININE 1.11 07/07/2014 0834      Component Value Date/Time   CALCIUM 8.8 03/13/2015 0410   ALKPHOS 133* 03/12/2015 1315   AST 90* 03/12/2015 1315   ALT 43 03/12/2015 1315   BILITOT 3.7* 03/12/2015 1315      RADIOGRAPHIC STUDIES:  Dg Chest 2 View  02/23/2015   CLINICAL DATA:  Preop for trans urethral resection of bladder tumor  EXAM: CHEST  2 VIEW  COMPARISON:  11/04/2014  FINDINGS: Chronic prominence of pulmonary vessels. No edema or effusion. No pneumonia. Normal heart size and aortic contours. Incidental bilateral cervical ribs.  IMPRESSION: Stable exam.  No acute cardiopulmonary disease.   Electronically Signed   By: Monte Fantasia M.D.   On: 02/23/2015 09:56   Nm Bone Scan Whole Body  03/16/2015  CLINICAL DATA:  Sclerotic lesion L3 vertebral body question metastatic disease, history of bladder cancer, occasional mid to low back pain worsened in past month  EXAM: NUCLEAR MEDICINE WHOLE BODY BONE SCAN  TECHNIQUE: Whole body anterior and posterior images were obtained approximately 3 hours after intravenous injection of radiopharmaceutical.  RADIOPHARMACEUTICALS:  25 mCi Technetium-20m MDP IV  COMPARISON:  None  Radiographic correlation: MRI lumbar spine 02/20/2015, chest radiograph 02/23/2015, at CT abdomen and pelvis 01/25/2014  FINDINGS: Abnormal soft tissue localization of tracer throughout mid abdomen and RIGHT mid abdomen.  Less than expected tracer within the LEFT kidney, likely related to anterior displacement of the kidney in patient with splenomegaly by prior CT.  No abnormal sites of osseous tracer accumulation identified.  Specifically no abnormal uptake is seen at the L3 vertebral body region.  Mild uptake at scattered joints, typically degenerative.   IMPRESSION: Mild scattered degenerative type uptake at multiple joints.  No other sites of abnormal osseous tracer accumulation are identified; specifically no abnormal uptake seen at the L3 vertebral body.  In combination with the MR, favor a benign process such as a vertebral hemangioma.  Abnormal tracer accumulation in the abdomen, centrally and to the RIGHT.  As this does not follow the typical peritoneal distribution for ascites, nor the expected pattern of delabeled tracer, cannot exclude intra-abdominal tumor ; recommend CT abdomen and pelvis with IV and oral contrast for further assessment.   Electronically Signed   By: Lavonia Dana M.D.   On: 03/16/2015 14:16     PATHOLOGY:   Diagnosis 1. Bladder, transurethral resection, right posterior bladder tumor LOW GRADE PAPILLARY UROTHELIAL CARCINOMA. NO EVIDENCE OF STROMAL INVASION. MUSCULARIS PROPRIA NOT PRESENT FOR EVALUATION. 2. Foreskin, adult FORESKIN TISSUE WITH UNDERLYING STROMAL FIBROSIS. NO EVIDENCE OF DYSPLASIA OR MALIGNANCY. 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 Dutch Gray on 03/12/2015 and 1 hr infusion of Mitomycin C.  MRI of L spine demonstrated a L3 sclerotic lesion in the body of the vertebra.  Given the patient bladder cancer history and recurrence, bone scan was performed and the L3 lesion (in combination with MRI findings) is favored to be benign in origin.  Oncology history is updated to reflect most recent events.  PSA today.  Today he is moderately uncomfortable with pain as expected.  He has Oxycodone at home for pain control.  He is trying to use them sparingly.  He postponed an injection for he back pain until next week while we worked up his vertebral body finding.  He is free from a Hem/Onc standpoint to pursue this  intervention.   Iron deficiency anemia due to chronic blood loss Stable.  Labs in 3 months and 6 months: CBC diff, iron/TIBC, ferritin  Return in 6 months with labs.    THERAPY PLAN:  We will monitor counts and continue to provide iron support when indicated.   All questions were answered. The patient knows to call the clinic with any problems, questions or concerns. We can certainly see the patient much sooner if necessary.  Patient and plan discussed with Dr. Ancil Linsey and she is in agreement with the aforementioned.   This note is electronically signed by: Robynn Pane 03/23/2015 12:44 PM

## 2015-03-23 NOTE — Patient Instructions (Signed)
..  [place] at [facility] Discharge Instructions  RECOMMENDATIONS MADE BY THE CONSULTANT AND ANY TEST RESULTS WILL BE SENT TO YOUR REFERRING PHYSICIAN.  Exam and discussion with Robynn Pane, PA-C. Return in 3 months and 6 months for lab work.   Follow up with PA in 6 months. Call for any symptoms, questions, or concerns.   Thank you for choosing [place] at [facility] to provide your oncology and hematology care.  To afford each patient quality time with our provider, please arrive at least 15 minutes before your scheduled appointment time.    You need to re-schedule your appointment should you arrive 10 or more minutes late.  We strive to give you quality time with our providers, and arriving late affects you and other patients whose appointments are after yours.  Also, if you no show three or more times for appointments you may be dismissed from the clinic at the providers discretion.     Again, thank you for choosing [facility].  Our hope is that these requests will decrease the amount of time that you wait before being seen by our physicians.       _____________________________________________________________  Should you have questions after your visit to [facility], please contact our office at [phone] between the hours of 8:30 a.m. and 4:30 p.m.  Voicemails left after 4:30 p.m. will not be returned until the following business day.  For prescription refill requests, have your pharmacy contact our office.

## 2015-03-24 LAB — PSA: PSA: 0.46 ng/mL (ref <=4.00)

## 2015-03-26 NOTE — Progress Notes (Signed)
Labs drawn

## 2015-03-28 ENCOUNTER — Ambulatory Visit (INDEPENDENT_AMBULATORY_CARE_PROVIDER_SITE_OTHER): Payer: Medicaid Other | Admitting: Podiatry

## 2015-03-28 ENCOUNTER — Encounter: Payer: Self-pay | Admitting: Podiatry

## 2015-03-28 DIAGNOSIS — B351 Tinea unguium: Secondary | ICD-10-CM

## 2015-03-28 DIAGNOSIS — M79676 Pain in unspecified toe(s): Secondary | ICD-10-CM | POA: Diagnosis not present

## 2015-03-28 NOTE — Patient Instructions (Signed)
Diabetes and Foot Care Diabetes may cause you to have problems because of poor blood supply (circulation) to your feet and legs. This may cause the skin on your feet to become thinner, break easier, and heal more slowly. Your skin may become dry, and the skin may peel and crack. You may also have nerve damage in your legs and feet causing decreased feeling in them. You may not notice minor injuries to your feet that could lead to infections or more serious problems. Taking care of your feet is one of the most important things you can do for yourself.  HOME CARE INSTRUCTIONS  Wear shoes at all times, even in the house. Do not go barefoot. Bare feet are easily injured.  Check your feet daily for blisters, cuts, and redness. If you cannot see the bottom of your feet, use a mirror or ask someone for help.  Wash your feet with warm water (do not use hot water) and mild soap. Then pat your feet and the areas between your toes until they are completely dry. Do not soak your feet as this can dry your skin.  Apply a moisturizing lotion or petroleum jelly (that does not contain alcohol and is unscented) to the skin on your feet and to dry, brittle toenails. Do not apply lotion between your toes.  Trim your toenails straight across. Do not dig under them or around the cuticle. File the edges of your nails with an emery board or nail file.  Do not cut corns or calluses or try to remove them with medicine.  Wear clean socks or stockings every day. Make sure they are not too tight. Do not wear knee-high stockings since they may decrease blood flow to your legs.  Wear shoes that fit properly and have enough cushioning. To break in new shoes, wear them for just a few hours a day. This prevents you from injuring your feet. Always look in your shoes before you put them on to be sure there are no objects inside.  Do not cross your legs. This may decrease the blood flow to your feet.  If you find a minor scrape,  cut, or break in the skin on your feet, keep it and the skin around it clean and dry. These areas may be cleansed with mild soap and water. Do not cleanse the area with peroxide, alcohol, or iodine.  When you remove an adhesive bandage, be sure not to damage the skin around it.  If you have a wound, look at it several times a day to make sure it is healing.  Do not use heating pads or hot water bottles. They may burn your skin. If you have lost feeling in your feet or legs, you may not know it is happening until it is too late.  Make sure your health care provider performs a complete foot exam at least annually or more often if you have foot problems. Report any cuts, sores, or bruises to your health care provider immediately. SEEK MEDICAL CARE IF:   You have an injury that is not healing.  You have cuts or breaks in the skin.  You have an ingrown nail.  You notice redness on your legs or feet.  You feel burning or tingling in your legs or feet.  You have pain or cramps in your legs and feet.  Your legs or feet are numb.  Your feet always feel cold. SEEK IMMEDIATE MEDICAL CARE IF:   There is increasing redness,   swelling, or pain in or around a wound.  There is a red line that goes up your leg.  Pus is coming from a wound.  You develop a fever or as directed by your health care provider.  You notice a bad smell coming from an ulcer or wound. Document Released: 11/07/2000 Document Revised: 07/13/2013 Document Reviewed: 04/19/2013 Exit[employer] Patient Information 2015 [employer]. This information is not intended to replace advice given to you by your health care provider. Make sure you discuss any questions you have with your health care provider.  

## 2015-03-28 NOTE — Progress Notes (Signed)
Patient ID: Kyle [name], male   DOB: 1956-04-23, 59 y.o.   MRN: [id number]  Subjective: This patient presents today complaining of painful toenails and requests toenail debridement. Last visit for a similar service was severed 21st 2015  Objective: Patient's wife present to treatment room  The toenails are elongated, hypertrophic, incurvated, discolored and tender to direct palpation 6-10  Assessment: Symptomatic onychomycoses 6-10 Diabetic with satisfactory neurovascular status  Plan: Debrided toenails 10 without any bleeding  Reappoint at three-month intervals or at patient's request

## 2015-04-25 ENCOUNTER — Other Ambulatory Visit: Payer: Self-pay

## 2015-04-25 MED ORDER — PROPRANOLOL HCL 20 MG PO TABS: 20.0000 mg | ORAL_TABLET | Freq: Two times a day (BID) | ORAL | Status: DC

## 2015-05-21 ENCOUNTER — Encounter: Payer: Self-pay | Admitting: Physician Assistant

## 2015-05-21 ENCOUNTER — Ambulatory Visit (INDEPENDENT_AMBULATORY_CARE_PROVIDER_SITE_OTHER): Payer: Medicaid Other | Admitting: Physician Assistant

## 2015-05-21 VITALS — BP 92/56 | HR 80 | Temp 97.7°F | Resp 16 | Wt 274.0 lb

## 2015-05-21 DIAGNOSIS — J988 Other specified respiratory disorders: Secondary | ICD-10-CM | POA: Diagnosis not present

## 2015-05-21 DIAGNOSIS — B9689 Other specified bacterial agents as the cause of diseases classified elsewhere: Principal | ICD-10-CM

## 2015-05-21 MED ORDER — AZITHROMYCIN 250 MG PO TABS: ORAL_TABLET | ORAL | Status: DC

## 2015-05-21 NOTE — Progress Notes (Signed)
Patient ID: Kyle [name] MRN: [id number], DOB: [birth year], 59 y.o. Date of Encounter: 05/21/2015, 12:27 PM    Chief Complaint:  Chief Complaint  Patient presents with  . [place] and has left side pain and has bad cold     HPI: 59 y.o. year old white male says that he has had cold symptoms for one week. Congestion in both his head and chest. When he coughs he feels some discomfort in his left chest and thinks that secondary to where he fell and hit that area several weeks ago. No sore throat or earache. No fevers or chills.     Home Meds:   Outpatient Prescriptions Prior to Visit  Medication Sig Dispense Refill  . furosemide (LASIX) 40 MG tablet Take 2 tablets each morning. 60 tablet 6  . gabapentin (NEURONTIN) 300 MG capsule TAKE ONE CAPSULE BY MOUTH IN THE MORNING, ONE CAPSULE BY MOUTH AT NOON, THEN TAKE THREE CAPSULES BY MOUTH AT BEDTIME (Patient taking differently: TAKE TWO CAPSULES TWICE DAILY) 150 capsule 5  . glucose blood (ACCU-CHEK AVIVA) test strip 1 each by Other route 3 (three) times daily. Use as instructed 100 each 12  . insulin regular human CONCENTRATED (HUMULIN R) 500 UNIT/ML SOLN injection Inject 60 Units into the skin 3 (three) times daily.     [name] lactulose (CHRONULAC) 10 GM/15ML solution Take 15CC orally up to three times a day to achieve 2 to 3 soft BMs daily. (Patient taking differently: Take 10 g by mouth daily. ) 1892 mL 3  . omeprazole (PRILOSEC) 40 MG capsule Take 1 capsule (40 mg total) by mouth daily. 30 capsule 11  . oxyCODONE (ROXICODONE) 5 MG immediate release tablet Take 1 tablet (5 mg total) by mouth every 4 (four) hours as needed for severe pain. 30 tablet 0  . propranolol (INDERAL) 20 MG tablet Take 1 tablet (20 mg total) by mouth 2 (two) times daily. 60 tablet 5  . rifaximin (XIFAXAN) 550 MG TABS tablet Take 1 tablet (550 mg total) by mouth 2 (two) times daily. 60 tablet 5  . spironolactone (ALDACTONE) 50 MG tablet Take 50 mg by mouth daily.    [name]  zolpidem (AMBIEN) 5 MG tablet Take 1-2 tablets (5-10 mg total) by mouth at bedtime as needed for sleep. 60 tablet 2  . oxybutynin (DITROPAN) 5 MG tablet Take 1 tablet (5 mg total) by mouth 3 (three) times daily as needed for bladder spasms. 16 tablet 0   No facility-administered medications prior to visit.    Allergies:  Allergies  Allergen Reactions  . Aspirin Other (See Comments)    Harms liver.   . Ibuprofen Other (See Comments)    Can't take per Dr Gala Romney- harms livers  . Tylenol [Acetaminophen] Other (See Comments)    Restless Legs       Review of Systems: See HPI for pertinent ROS. All other ROS negative.    Physical Exam: Blood pressure 92/56, pulse 80, temperature 97.7 F (36.5 C), temperature source Oral, resp. rate 16, weight 274 lb (124.286 kg)., Body mass index is 42.9 kg/(m^2). General: WM.  Appears in no acute distress. HEENT: Normocephalic, atraumatic, eyes without discharge, sclera non-icteric, nares are without discharge. Bilateral auditory canals clear, TM's are without perforation, pearly grey and translucent with reflective cone of light bilaterally. Oral cavity moist, posterior pharynx without exudate, erythema, peritonsillar abscess. No tenderness with percussion of frontal or maxillary sinuses bilaterally.  Neck: Supple. No thyromegaly. No lymphadenopathy. Lungs: Clear bilaterally to  auscultation without wheezes, rales, or rhonchi. Breathing is unlabored. Breath Sounds are somewhat distant and decreased but clear. No wheezing. Heart: Regular rhythm. No murmurs, rubs, or gallops. Msk:  Strength and tone normal for age. Extremities/Skin: Warm and dry.  Neuro: Alert and oriented X 3. Moves all extremities spontaneously. Gait is normal. CNII-XII grossly in tact. Psych:  Responds to questions appropriately with a normal affect.     ASSESSMENT AND PLAN:  59 y.o. year old male with  1. Bacterial respiratory infection Take Mucinex DM as directed for expectorate.  Take antibiotic as directed and complete all of it. Follow-up if symptoms do not resolve within 1 week after completion of antibiotic. - azithromycin (ZITHROMAX) 250 MG tablet; Day 1: Take 2 daily.  Days 2-5: Take 1 daily.  Dispense: 6 tablet; Refill: 0   Signed, [address] [place], [place], [facility] 05/21/2015 12:27 PM

## 2015-05-30 ENCOUNTER — Other Ambulatory Visit (HOSPITAL_COMMUNITY): Payer: Self-pay | Admitting: Oncology

## 2015-05-30 DIAGNOSIS — K7460 Unspecified cirrhosis of liver: Secondary | ICD-10-CM

## 2015-05-30 DIAGNOSIS — R188 Other ascites: Secondary | ICD-10-CM

## 2015-05-30 MED ORDER — FUROSEMIDE 40 MG PO TABS: ORAL_TABLET | ORAL | Status: DC

## 2015-06-22 ENCOUNTER — Encounter (HOSPITAL_COMMUNITY): Payer: Medicaid Other | Attending: Hematology & Oncology

## 2015-06-22 DIAGNOSIS — D500 Iron deficiency anemia secondary to blood loss (chronic): Secondary | ICD-10-CM | POA: Insufficient documentation

## 2015-06-22 DIAGNOSIS — C679 Malignant neoplasm of bladder, unspecified: Secondary | ICD-10-CM | POA: Diagnosis not present

## 2015-06-22 LAB — CBC WITH DIFFERENTIAL/PLATELET
BASOS ABS: 0 10*3/uL (ref 0.0–0.1)
Basophils Relative: 0 % (ref 0–1)
Eosinophils Absolute: 0.1 10*3/uL (ref 0.0–0.7)
Eosinophils Relative: 2 % (ref 0–5)
HEMATOCRIT: 40.5 % (ref 39.0–52.0)
Hemoglobin: 14 g/dL (ref 13.0–17.0)
Lymphocytes Relative: 34 % (ref 12–46)
Lymphs Abs: 2.2 10*3/uL (ref 0.7–4.0)
MCH: 34.5 pg — ABNORMAL HIGH (ref 26.0–34.0)
MCHC: 34.6 g/dL (ref 30.0–36.0)
MCV: 99.8 fL (ref 78.0–100.0)
MONO ABS: 0.8 10*3/uL (ref 0.1–1.0)
Monocytes Relative: 13 % — ABNORMAL HIGH (ref 3–12)
NEUTROS ABS: 3.3 10*3/uL (ref 1.7–7.7)
Neutrophils Relative %: 51 % (ref 43–77)
Platelets: 60 10*3/uL — ABNORMAL LOW (ref 150–400)
RBC: 4.06 MIL/uL — ABNORMAL LOW (ref 4.22–5.81)
RDW: 15.1 % (ref 11.5–15.5)
SMEAR REVIEW: DECREASED
WBC: 6.4 10*3/uL (ref 4.0–10.5)

## 2015-06-22 LAB — IRON AND TIBC
Iron: 174 ug/dL (ref 45–182)
Saturation Ratios: 72 % — ABNORMAL HIGH (ref 17.9–39.5)
TIBC: 242 ug/dL — AB (ref 250–450)
UIBC: 68 ug/dL

## 2015-06-22 LAB — FERRITIN: Ferritin: 203 ng/mL (ref 24–336)

## 2015-06-22 NOTE — Progress Notes (Signed)
LABS DRAWN

## 2015-07-02 ENCOUNTER — Emergency Department (HOSPITAL_COMMUNITY): Payer: Medicaid Other

## 2015-07-02 ENCOUNTER — Emergency Department (HOSPITAL_COMMUNITY)
Admission: EM | Admit: 2015-07-02 | Discharge: 2015-07-02 | Disposition: A | Discharge status: Home / Self Care | Payer: Medicaid Other | Attending: Emergency Medicine | Admitting: Emergency Medicine

## 2015-07-02 ENCOUNTER — Encounter (HOSPITAL_COMMUNITY): Payer: Self-pay | Admitting: Emergency Medicine

## 2015-07-02 DIAGNOSIS — Z8619 Personal history of other infectious and parasitic diseases: Secondary | ICD-10-CM | POA: Diagnosis not present

## 2015-07-02 DIAGNOSIS — Z9981 Dependence on supplemental oxygen: Secondary | ICD-10-CM | POA: Diagnosis not present

## 2015-07-02 DIAGNOSIS — R609 Edema, unspecified: Secondary | ICD-10-CM

## 2015-07-02 DIAGNOSIS — Z720 Tobacco use: Secondary | ICD-10-CM | POA: Insufficient documentation

## 2015-07-02 DIAGNOSIS — I10 Essential (primary) hypertension: Secondary | ICD-10-CM | POA: Diagnosis not present

## 2015-07-02 DIAGNOSIS — Z8551 Personal history of malignant neoplasm of bladder: Secondary | ICD-10-CM | POA: Insufficient documentation

## 2015-07-02 DIAGNOSIS — Z8614 Personal history of Methicillin resistant Staphylococcus aureus infection: Secondary | ICD-10-CM | POA: Diagnosis not present

## 2015-07-02 DIAGNOSIS — G4730 Sleep apnea, unspecified: Secondary | ICD-10-CM | POA: Diagnosis not present

## 2015-07-02 DIAGNOSIS — E119 Type 2 diabetes mellitus without complications: Secondary | ICD-10-CM | POA: Insufficient documentation

## 2015-07-02 DIAGNOSIS — K219 Gastro-esophageal reflux disease without esophagitis: Secondary | ICD-10-CM | POA: Insufficient documentation

## 2015-07-02 DIAGNOSIS — Z872 Personal history of diseases of the skin and subcutaneous tissue: Secondary | ICD-10-CM | POA: Insufficient documentation

## 2015-07-02 DIAGNOSIS — Z79899 Other long term (current) drug therapy: Secondary | ICD-10-CM | POA: Insufficient documentation

## 2015-07-02 DIAGNOSIS — Z862 Personal history of diseases of the blood and blood-forming organs and certain disorders involving the immune mechanism: Secondary | ICD-10-CM | POA: Diagnosis not present

## 2015-07-02 DIAGNOSIS — Z794 Long term (current) use of insulin: Secondary | ICD-10-CM | POA: Insufficient documentation

## 2015-07-02 DIAGNOSIS — M7989 Other specified soft tissue disorders: Secondary | ICD-10-CM | POA: Diagnosis present

## 2015-07-02 DIAGNOSIS — Z8659 Personal history of other mental and behavioral disorders: Secondary | ICD-10-CM | POA: Diagnosis not present

## 2015-07-02 DIAGNOSIS — R600 Localized edema: Secondary | ICD-10-CM | POA: Diagnosis not present

## 2015-07-02 DIAGNOSIS — Z8719 Personal history of other diseases of the digestive system: Secondary | ICD-10-CM | POA: Insufficient documentation

## 2015-07-02 DIAGNOSIS — G629 Polyneuropathy, unspecified: Secondary | ICD-10-CM | POA: Insufficient documentation

## 2015-07-02 LAB — CBC WITH DIFFERENTIAL/PLATELET
Basophils Absolute: 0 10*3/uL (ref 0.0–0.1)
Basophils Relative: 0 % (ref 0–1)
Eosinophils Absolute: 0.1 10*3/uL (ref 0.0–0.7)
Eosinophils Relative: 2 % (ref 0–5)
HCT: 42.3 % (ref 39.0–52.0)
Hemoglobin: 14.5 g/dL (ref 13.0–17.0)
LYMPHS ABS: 1.8 10*3/uL (ref 0.7–4.0)
Lymphocytes Relative: 31 % (ref 12–46)
MCH: 34.5 pg — AB (ref 26.0–34.0)
MCHC: 34.3 g/dL (ref 30.0–36.0)
MCV: 100.7 fL — AB (ref 78.0–100.0)
MONOS PCT: 10 % (ref 3–12)
Monocytes Absolute: 0.6 10*3/uL (ref 0.1–1.0)
Neutro Abs: 3.4 10*3/uL (ref 1.7–7.7)
Neutrophils Relative %: 57 % (ref 43–77)
Platelets: 50 10*3/uL — ABNORMAL LOW (ref 150–400)
RBC: 4.2 MIL/uL — AB (ref 4.22–5.81)
RDW: 15.4 % (ref 11.5–15.5)
WBC: 5.9 10*3/uL (ref 4.0–10.5)

## 2015-07-02 LAB — COMPREHENSIVE METABOLIC PANEL
ALBUMIN: 2.7 g/dL — AB (ref 3.5–5.0)
ALK PHOS: 154 U/L — AB (ref 38–126)
ALT: 30 U/L (ref 17–63)
AST: 59 U/L — AB (ref 15–41)
Anion gap: 8 (ref 5–15)
BUN: 13 mg/dL (ref 6–20)
CO2: 33 mmol/L — ABNORMAL HIGH (ref 22–32)
CREATININE: 1.36 mg/dL — AB (ref 0.61–1.24)
Calcium: 9.2 mg/dL (ref 8.9–10.3)
Chloride: 96 mmol/L — ABNORMAL LOW (ref 101–111)
GFR calc Af Amer: >60 mL/min (ref >60)
GFR, EST NON AFRICAN AMERICAN: 56 mL/min — AB (ref >60)
Glucose, Bld: 197 mg/dL — ABNORMAL HIGH (ref 65–99)
POTASSIUM: 4.4 mmol/L (ref 3.5–5.1)
Sodium: 137 mmol/L (ref 135–145)
TOTAL PROTEIN: 7.8 g/dL (ref 6.5–8.1)
Total Bilirubin: 4.7 mg/dL — ABNORMAL HIGH (ref 0.3–1.2)

## 2015-07-02 LAB — URINALYSIS, ROUTINE W REFLEX MICROSCOPIC
BILIRUBIN URINE: NEGATIVE
Glucose, UA: NEGATIVE mg/dL
HGB URINE DIPSTICK: NEGATIVE
Ketones, ur: NEGATIVE mg/dL
LEUKOCYTES UA: NEGATIVE
Nitrite: NEGATIVE
PROTEIN: NEGATIVE mg/dL
Specific Gravity, Urine: 1.01 (ref 1.005–1.030)
UROBILINOGEN UA: 0.2 mg/dL (ref 0.0–1.0)
pH: 5.5 (ref 5.0–8.0)

## 2015-07-02 LAB — CBG MONITORING, ED: Glucose-Capillary: 191 mg/dL — ABNORMAL HIGH (ref 65–99)

## 2015-07-02 MED ORDER — MORPHINE SULFATE 4 MG/ML IJ SOLN
4.0000 mg | INTRAMUSCULAR | Status: DC | PRN
  Administered 2015-07-02: 4 mg via INTRAVENOUS
  Filled 2015-07-02: qty 1

## 2015-07-02 MED ORDER — FUROSEMIDE 80 MG PO TABS: 80.0000 mg | ORAL_TABLET | Freq: Two times a day (BID) | ORAL | Status: DC

## 2015-07-02 MED ORDER — FUROSEMIDE 10 MG/ML IJ SOLN
80.0000 mg | Freq: Once | INTRAMUSCULAR | Status: AC
  Administered 2015-07-02: 80 mg via INTRAVENOUS
  Filled 2015-07-02: qty 8

## 2015-07-02 MED ORDER — POTASSIUM CHLORIDE ER 20 MEQ PO TBCR: 20.0000 meq | EXTENDED_RELEASE_TABLET | Freq: Two times a day (BID) | ORAL | Status: DC

## 2015-07-02 MED ORDER — ONDANSETRON HCL 4 MG/2ML IJ SOLN
4.0000 mg | Freq: Once | INTRAMUSCULAR | Status: AC
  Administered 2015-07-02: 4 mg via INTRAVENOUS
  Filled 2015-07-02: qty 2

## 2015-07-02 MED ORDER — OXYCODONE HCL 5 MG PO TABS: 5.0000 mg | ORAL_TABLET | ORAL | Status: AC | PRN

## 2015-07-02 MED ORDER — TRIAMCINOLONE ACETONIDE 0.1 % EX CREA: 1.0000 "application " | TOPICAL_CREAM | Freq: Two times a day (BID) | CUTANEOUS | Status: DC

## 2015-07-02 NOTE — ED Provider Notes (Signed)
CSN: [id number]     Arrival date & time 07/02/15  1033 History   First MD Initiated Contact with Patient 07/02/15 1208     Chief Complaint  Patient presents with  . Leg Swelling      HPI  Patient presents for evaluation of lower cavity edema. States his legs have become progressively swollen over the last few weeks. Continues to be compliant with his Lasix. No history of CHF but does have history of dependent edema. No difficulty breathing chest pain. Normal urine output. History of diabetes. Symmetric. Bilateral extremities equally affected. No fever. No erythema.  Past Medical History  Diagnosis Date  . DM (diabetes mellitus)   . Cirrhosis     bx proven steatohepatitis with cirrhosis (2010); per note from Feb 2011, received Hep A and B vaccines in 2010.  [name] HTN (hypertension)   . RLS (restless legs syndrome)   . Hyperlipidemia   . IDA (iron deficiency anemia)   . GERD (gastroesophageal reflux disease)   . Depression   . Peripheral neuropathy   . Urothelial cancer     2010, paillary low-grade, h/o recurrence 2011  . B12 deficiency   . Psoriasis   . 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 Anemia due to multiple mechanisms 05/13/2011  . History of alcohol abuse 05/13/2011  . ANEMIA-IRON DEFICIENCY 03/09/2009  . S/P endoscopy May 2012    4 columns grade II esophageal varices; due for repeat in Nov 2013   . S/P colonoscopy May 2012    Tubular adenoma  . Low back pain     possible bulging disc, bone scan   . Cancer     bladder ca 05/2009 removal and  w/chemo wash  . Esophageal varices with bleeding(456.0) 11/24/11    s/p emergent EGD 11/25/11 by Dr. Hilarie Fredrickson at [facility], Grade III esophageal varices s/p banding X 5  . Small bowel obstruction 02/15/2012    Admitted to [facility], managed by Dr. Geroge Baseman, ventral hernia manually reduced  . Ventral hernia 02/15/12  . MRSA (methicillin resistant Staphylococcus aureus)   . Acute post-ligation esophageal ulcer with hemorrhage 04/11/2012  .  Clostridium difficile infection   . H. pylori infection 12/24/2012  . Sleep apnea     [facility] took him off CPAP when had tips procedure  . Sleep apnea   . Cellulitis 04/2014    right upper leg  . Iron deficiency anemia secondary to blood loss (chronic) 03/09/2009    Secondary to GI blood loss- Rectal and esophageal varices, portal gastropathy, and esophageal ulcers    . Bladder cancer    Past Surgical History  Procedure Laterality Date  . Carpel tunnel Right   . Shoulder surgery Left   . Adrenal mass surgery  05/2009    benign, left  . Bladder surgery  01/2009 and 06/2010    cancer 2010, small recurrence in 06/2010  . Colonoscopy  04/2009    moderate int hemorrhoids, rare sigmoid diverticula, one mm sessile hyperplastic rectal polyp  . Esophagogastroduodenoscopy  03/2009    small hh  . Small bowel capsule endoscopy  03/2009    couple of small benign appearing erosions, nonbleeding  . Egd/tcs  08/2007    small hiatal hernia, pancolonic diverticula, friable anal canal, 3cm salmon colored epithelium in distal esophagus, bx negative for Barrett's  . Skin cancer excision  Oct 2012    left arm  . Esophagogastroduodenoscopy  11/25/2011    Dr. Phineas Douglas III varices in the mid and distal esophagus, banding  placed, portal astropathy  . Esophagogastroduodenoscopy  01/14/2012    Dr Rourk->4-5 columns Gr2 varices, 6 bands placed, HH, distal esophageal ulcer, portal gastropathy, antral erosions  . Esophagogastroduodenoscopy  03/17/2012    Dr. Larina Bras varices status post band ligation. Hiatal hernia. Portal gastropathy.  . Umbilical hernia repair  04/05/2012    Procedure: HERNIA REPAIR UMBILICAL ADULT;  Surgeon: Donato Heinz, MD;  Location: [facility];  Service: General;  Laterality: N/A;  . Esophagogastroduodenoscopy  04/11/2012    Dr. Bridgett Larsson ulcer possibly at previous banding sites with stigmata     of bleeding.  Attempt at hemostasis with hemoclip and banding was not successful resulting  in recurrence of bleed/  Portal gastropathy, but no evidence of gastric varices/ Recurrent esophageal varices grade 2.  . Esophagogastroduodenoscopy  05/24/2012    Dr. Larina Bras varices, portal gastropathy  . Esophagogastroduodenoscopy  04/11/12    Dr. Drue Novel in the distal esophagus adherent lot and visible vessel treated with bicap. grade I varices in the distal esophagus, one ligating band placed, portal hypertensive gastropathy in the body of the stomach.  . Colonoscopy  09/29/2012    QAS:TMHDQQ varices. Scattered pancolonic diverticula/single cecal polyp-removed as described above  . Esophagogastroduodenoscopy  12/24/2012    Dr. Lynwood Dawley significant upper GI bleed secondary to bleeding esophageal varices. s/p hemostasis therapy with injection of a total of 69mL of 5% ethanolamine and application of 9 bands, complete egd not carried out.  . Schlerotherapy  12/24/2012    Procedure: Woodward Ku OF VARICES;  Surgeon: Daneil Dolin, MD;  Location: [facility];  Service: Endoscopy;  Laterality: N/A;  . Esophageal banding  12/24/2012    IWL:[id number] significant upper GI bleed secondary to bleeding esophageal varices  . Tips procedure  12/2012    [facility]  . Cataract extraction, bilateral    . Hernia repair      umbilical  . Eye surgery      bilateral cataract surgery with lens implants  . Transurethral resection of bladder tumor N/A 03/12/2015    Procedure: TRANSURETHRAL RESECTION OF BLADDER TUMOR (TURBT);  Surgeon: Raynelle Bring, MD;  Location: [facility];  Service: Urology;  Laterality: N/A;  . Cystoscopy w/ ureteral stent placement Bilateral 03/12/2015    Procedure: CYSTOSCOPY WITH BILATERAL RETROGRADE PYELOGRAM/RIGHT URETEROSCOPY;  Surgeon: Raynelle Bring, MD;  Location: [facility];  Service: Urology;  Laterality: Bilateral;  . Circumcision N/A 03/12/2015    Procedure: CIRCUMCISION ADULT;  Surgeon: Raynelle Bring, MD;  Location: [facility];  Service: Urology;  Laterality: N/A;    Family History  Problem Relation Age of Onset  . Cirrhosis Father     etoh  . Colon cancer Neg Hx   . Anesthesia problems Neg Hx   . Hypotension Neg Hx   . Malignant hyperthermia Neg Hx   . Pseudochol deficiency Neg Hx   . Kidney cancer Mother   . Cancer Mother   . HIV Brother   . Cirrhosis Brother     nash   History  Substance Use Topics  . Smoking status: Current Every Day Smoker -- 1.00 packs/day for 30 years    Types: Cigarettes  . Smokeless tobacco: Never Used  . Alcohol Use: No     Comment: drank heavily for few years in 20s    Review of Systems  Constitutional: Negative for fever, chills, diaphoresis, appetite change and fatigue.  HENT: Negative for mouth sores, sore throat and trouble swallowing.   Eyes: Negative for visual disturbance.  Respiratory: Negative for cough, chest  tightness, shortness of breath and wheezing.   Cardiovascular: Positive for leg swelling. Negative for chest pain.  Gastrointestinal: Negative for nausea, vomiting, abdominal pain, diarrhea and abdominal distention.  Endocrine: Negative for polydipsia, polyphagia and polyuria.  Genitourinary: Negative for dysuria, frequency and hematuria.  Musculoskeletal: Negative for gait problem.  Skin: Negative for color change, pallor and rash.  Neurological: Negative for dizziness, syncope, light-headedness and headaches.  Hematological: Does not bruise/bleed easily.  Psychiatric/Behavioral: Negative for behavioral problems and confusion.      Allergies  Aspirin; Ibuprofen; and Tylenol  Home Medications   Prior to Admission medications   Medication Sig Start Date End Date Taking? Authorizing Provider  furosemide (LASIX) 40 MG tablet Take 2 tablets each morning. Patient taking differently: Take 80 mg by mouth daily. Take 2 tablets each morning. 05/30/15  Yes Manon Hilding Kefalas, PA-C  gabapentin (NEURONTIN) 300 MG capsule TAKE ONE CAPSULE BY MOUTH IN THE MORNING, ONE CAPSULE BY MOUTH AT NOON, THEN  TAKE THREE CAPSULES BY MOUTH AT BEDTIME Patient taking differently: TAKE TWO CAPSULES TWICE DAILY 12/22/14  Yes Orlena Sheldon, PA-C  insulin regular human CONCENTRATED (HUMULIN R) 500 UNIT/ML SOLN injection Inject 60 Units into the skin 3 (three) times daily.    Yes Historical Provider, MD  lactulose (CHRONULAC) 10 GM/15ML solution Take 15CC orally up to three times a day to achieve 2 to 3 soft BMs daily. Patient taking differently: Take 10 g by mouth daily.  08/01/14  Yes Mahala Menghini, PA-C  omeprazole (PRILOSEC) 40 MG capsule Take 1 capsule (40 mg total) by mouth daily. 12/29/14  Yes Orvil Feil, NP  oxyCODONE (ROXICODONE) 5 MG immediate release tablet Take 1 tablet (5 mg total) by mouth every 4 (four) hours as needed for severe pain. Patient taking differently: Take 5 mg by mouth every 4 (four) hours as needed for moderate pain or severe pain.  03/12/15  Yes Raynelle Bring, MD  propranolol (INDERAL) 20 MG tablet Take 1 tablet (20 mg total) by mouth 2 (two) times daily. 04/25/15  Yes Orvil Feil, NP  rifaximin (XIFAXAN) 550 MG TABS tablet Take 1 tablet (550 mg total) by mouth 2 (two) times daily. 02/27/15  Yes Mahala Menghini, PA-C  spironolactone (ALDACTONE) 50 MG tablet Take 50 mg by mouth daily. 03/03/14  Yes Historical Provider, MD  zolpidem (AMBIEN) 5 MG tablet Take 1-2 tablets (5-10 mg total) by mouth at bedtime as needed for sleep. 03/06/15  Yes Manon Hilding Kefalas, PA-C  azithromycin (ZITHROMAX) 250 MG tablet Day 1: Take 2 daily.  Days 2-5: Take 1 daily. Patient not taking: Reported on 07/02/2015 05/21/15   Orlena Sheldon, PA-C  glucose blood (ACCU-CHEK AVIVA) test strip 1 each by Other route 3 (three) times daily. Use as instructed 05/23/14   Orlena Sheldon, PA-C   BP 122/70 mmHg  Pulse 74  Temp(Src) 98.1 F (36.7 C)  Resp 20  Ht 5' 7.5" (1.715 m)  Wt 276 lb (125.193 kg)  BMI 42.56 kg/m2  SpO2 100% Physical Exam  Constitutional: He is oriented to person, place, and time. He appears well-developed and  well-nourished. No distress.  HENT:  Head: Normocephalic.  Eyes: Conjunctivae are normal. Pupils are equal, round, and reactive to light. No scleral icterus.  Neck: Normal range of motion. Neck supple. No thyromegaly present.  Cardiovascular: Normal rate and regular rhythm.  Exam reveals no gallop and no friction rub.   No murmur heard. Pulmonary/Chest: Effort normal and breath sounds normal.  No respiratory distress. He has no wheezes. He has no rales.  Abdominal: Soft. Bowel sounds are normal. He exhibits no distension. There is no tenderness. There is no rebound.  Musculoskeletal: Normal range of motion.  Neurological: He is alert and oriented to person, place, and time.  Skin: Skin is warm and dry. No rash noted.  Bilateral symmetric lower extremity edema. No saline as. No cording or swelling.  Psychiatric: He has a normal mood and affect. His behavior is normal.    ED Course  Procedures (including critical care time) Labs Review Labs Reviewed  CBC WITH DIFFERENTIAL/PLATELET - Abnormal; Notable for the following:    RBC 4.20 (*)    MCV 100.7 (*)    MCH 34.5 (*)    Platelets 50 (*)    All other components within normal limits  COMPREHENSIVE METABOLIC PANEL - Abnormal; Notable for the following:    Chloride 96 (*)    CO2 33 (*)    Glucose, Bld 197 (*)    Creatinine, Ser 1.36 (*)    Albumin 2.7 (*)    AST 59 (*)    Alkaline Phosphatase 154 (*)    Total Bilirubin 4.7 (*)    GFR calc non Af Amer 56 (*)    All other components within normal limits  URINALYSIS, ROUTINE W REFLEX MICROSCOPIC (NOT AT [facility]) - Abnormal; Notable for the following:    APPearance HAZY (*)    All other components within normal limits  CBG MONITORING, ED - Abnormal; Notable for the following:    Glucose-Capillary 191 (*)    All other components within normal limits    Imaging Review Dg Chest 2 View  07/02/2015   CLINICAL DATA:  59 year old male with progressive lower extremity swelling  EXAM: CHEST  2  VIEW  COMPARISON:  Prior chest x-ray 02/23/2015  FINDINGS: Cardiac and mediastinal contours remain within normal limits. Minimal pulmonary vascular congestion which is improved compared to 02/23/2015. No overt pulmonary edema. There is very mild cephalization. No evidence of pleural effusion, focal airspace consolidation, pneumothorax or suspicious pulmonary mass or nodule. Multiple soft tissue anchors are present within the left humeral head suggesting prior rotator cuff repair. No acute osseous abnormality.  IMPRESSION: 1. Mild pulmonary vascular congestion without evidence of overt edema. 2. Overall, the degree of pulmonary vascular congestion appears improved compared to 02/23/2015.   Electronically Signed   By: Jacqulynn Cadet M.D.   On: 07/02/2015 13:54     EKG Interpretation None      MDM   Final diagnoses:  Edema    Intact renal function, however creatinine minimally elevated at 1.36. We discussed early follow-up with his physician to recheck creatinine. Will increase Lasix 5 days with supplemental potassium. He or the knee changes. Including decreased urine output, difficulty breathing, new or worsening symptoms.    Tanna Furry, MD 07/03/15 [phone]

## 2015-07-02 NOTE — Discharge Instructions (Signed)
Stay active. Whenever you are at rest, elevate your feet above your heart. TED compression hose from any pharmacy. Recheck with your physician within 7 days to recheck your kidney function labs. After 7 days on the high-dose Lasix and potassium, resume your regular potassium dosage each morning.  Edema Edema is an abnormal buildup of fluids in your bodytissues. Edema is somewhatdependent on gravity to pull the fluid to the lowest place in your body. That makes the condition more common in the legs and thighs (lower extremities). Painless swelling of the feet and ankles is common and becomes more likely as you get older. It is also common in looser tissues, like around your eyes.  When the affected area is squeezed, the fluid may move out of that spot and leave a dent for a few moments. This dent is called pitting.  CAUSES  There are many possible causes of edema. Eating too much salt and being on your feet or sitting for a long time can cause edema in your legs and ankles. Hot weather may make edema worse. Common medical causes of edema include:  Heart failure.  Liver disease.  Kidney disease.  Weak blood vessels in your legs.  Cancer.  An injury.  Pregnancy.  Some medications.  Obesity. SYMPTOMS  Edema is usually painless.Your skin may look swollen or shiny.  DIAGNOSIS  Your health care provider may be able to diagnose edema by asking about your medical history and doing a physical exam. You may need to have tests such as X-rays, an electrocardiogram, or blood tests to check for medical conditions that may cause edema.  TREATMENT  Edema treatment depends on the cause. If you have heart, liver, or kidney disease, you need the treatment appropriate for these conditions. General treatment may include:  Elevation of the affected body part above the level of your heart.  Compression of the affected body part. Pressure from elastic bandages or support stockings squeezes the  tissues and forces fluid back into the blood vessels. This keeps fluid from entering the tissues.  Restriction of fluid and salt intake.  Use of a water pill (diuretic). These medications are appropriate only for some types of edema. They pull fluid out of your body and make you urinate more often. This gets rid of fluid and reduces swelling, but diuretics can have side effects. Only use diuretics as directed by your health care provider. HOME CARE INSTRUCTIONS   Keep the affected body part above the level of your heart when you are lying down.   Do not sit still or stand for prolonged periods.   Do not put anything directly under your knees when lying down.  Do not wear constricting clothing or garters on your upper legs.   Exercise your legs to work the fluid back into your blood vessels. This may help the swelling go down.   Wear elastic bandages or support stockings to reduce ankle swelling as directed by your health care provider.   Eat a low-salt diet to reduce fluid if your health care provider recommends it.   Only take medicines as directed by your health care provider. SEEK MEDICAL CARE IF:   Your edema is not responding to treatment.  You have heart, liver, or kidney disease and notice symptoms of edema.  You have edema in your legs that does not improve after elevating them.   You have sudden and unexplained weight gain. SEEK IMMEDIATE MEDICAL CARE IF:   You develop shortness of breath or  chest pain.   You cannot breathe when you lie down.  You develop pain, redness, or warmth in the swollen areas.   You have heart, liver, or kidney disease and suddenly get edema.  You have a fever and your symptoms suddenly get worse. MAKE SURE YOU:   Understand these instructions.  Will watch your condition.  Will get help right away if you are not doing well or get worse. Document Released: 11/10/2005 Document Revised: 03/27/2014 Document Reviewed:  09/02/2013 [facility] Patient Information 2015 [place], [place]. This information is not intended to replace advice given to you by your health care provider. Make sure you discuss any questions you have with your health care provider.

## 2015-07-02 NOTE — ED Notes (Deleted)
Pt states that she has no known injury - Has been having rt ankle pain off and on for past week- however on Saturday she also noted pain in her rt knee with movement and trying to bend. Pt states knee is swollen , and has some swelling to her ankle.  Denies any previous problems

## 2015-07-02 NOTE — ED Notes (Signed)
MD at bedside. 

## 2015-07-02 NOTE — ED Notes (Signed)
Pt states that he has been having increase in swelling in his lower extremeties . Denies hx of CHF - Does have hx of edema to lower extremeties

## 2015-07-13 ENCOUNTER — Telehealth: Payer: Self-pay | Admitting: Physician Assistant

## 2015-07-13 NOTE — Telephone Encounter (Signed)
Last time I saw him was 9/15 (don't know what he wants).

## 2015-07-13 NOTE — Telephone Encounter (Signed)
Patient is calling to speak with you regarding his diabetes and possible dr pickard taking over care for his diabetes  [phone]

## 2015-07-13 NOTE — Telephone Encounter (Signed)
Not sure who he sees but before I call him back will you take over on his DM.

## 2015-07-16 NOTE — Telephone Encounter (Signed)
LMOVM to schedule appt to see Dr. Dennard Schaumann and discuss at that time.

## 2015-07-18 LAB — BLOOD GAS, ARTERIAL
Acid-base deficit: 6.4 mmol/L — ABNORMAL HIGH (ref 0.0–2.0)
Bicarbonate: 20.2 mEq/L (ref 20.0–24.0)
Drawn by: 22223
FIO2: 100
MECHVT: 550 mL
O2 Saturation: 99.5 %
PEEP: 5 cmH2O
Patient temperature: 37
RATE: 16 resp/min
TCO2: 20.1 mmol/L (ref 0–100)
pCO2 arterial: 52.6 mmHg — ABNORMAL HIGH (ref 35.0–45.0)
pH, Arterial: 7.209 — ABNORMAL LOW (ref 7.350–7.450)
pO2, Arterial: 135 mmHg — ABNORMAL HIGH (ref 80.0–100.0)

## 2015-08-01 ENCOUNTER — Other Ambulatory Visit (HOSPITAL_COMMUNITY): Payer: Self-pay | Admitting: Oncology

## 2015-08-03 ENCOUNTER — Encounter (HOSPITAL_COMMUNITY): Payer: Self-pay | Admitting: *Deleted

## 2015-08-03 ENCOUNTER — Emergency Department (HOSPITAL_COMMUNITY)
Admission: EM | Admit: 2015-08-03 | Discharge: 2015-08-03 | Discharge status: Against Medical Advice | Payer: Medicaid Other | Attending: Emergency Medicine | Admitting: Emergency Medicine

## 2015-08-03 DIAGNOSIS — I10 Essential (primary) hypertension: Secondary | ICD-10-CM | POA: Insufficient documentation

## 2015-08-03 DIAGNOSIS — E119 Type 2 diabetes mellitus without complications: Secondary | ICD-10-CM | POA: Diagnosis not present

## 2015-08-03 DIAGNOSIS — Z720 Tobacco use: Secondary | ICD-10-CM | POA: Insufficient documentation

## 2015-08-03 DIAGNOSIS — L02414 Cutaneous abscess of left upper limb: Secondary | ICD-10-CM | POA: Insufficient documentation

## 2015-08-03 NOTE — ED Notes (Signed)
Pt told registration he was leaving 

## 2015-08-03 NOTE — ED Notes (Addendum)
Pt states he has an abscess x 3 days on the inside of his left thigh.

## 2015-08-16 ENCOUNTER — Other Ambulatory Visit (HOSPITAL_COMMUNITY): Payer: Self-pay | Admitting: Oncology

## 2015-08-16 DIAGNOSIS — G4700 Insomnia, unspecified: Secondary | ICD-10-CM

## 2015-08-16 MED ORDER — ZOLPIDEM TARTRATE 5 MG PO TABS: 5.0000 mg | ORAL_TABLET | Freq: Every evening | ORAL | Status: DC | PRN

## 2015-08-29 ENCOUNTER — Other Ambulatory Visit: Payer: Self-pay | Admitting: Gastroenterology

## 2015-08-29 ENCOUNTER — Other Ambulatory Visit: Payer: Self-pay | Admitting: Physician Assistant

## 2015-08-29 NOTE — Telephone Encounter (Signed)
Medication refilled per protocol. 

## 2015-08-30 ENCOUNTER — Encounter: Payer: Self-pay | Admitting: Family Medicine

## 2015-08-30 ENCOUNTER — Ambulatory Visit (INDEPENDENT_AMBULATORY_CARE_PROVIDER_SITE_OTHER): Payer: Medicaid Other | Admitting: Family Medicine

## 2015-08-30 VITALS — BP 100/60 | HR 72 | Temp 98.2°F | Resp 20 | Wt 270.0 lb

## 2015-08-30 DIAGNOSIS — E1140 Type 2 diabetes mellitus with diabetic neuropathy, unspecified: Secondary | ICD-10-CM | POA: Diagnosis not present

## 2015-08-30 DIAGNOSIS — E1165 Type 2 diabetes mellitus with hyperglycemia: Secondary | ICD-10-CM | POA: Diagnosis not present

## 2015-08-30 LAB — LIPID PANEL
Cholesterol: 247 mg/dL — ABNORMAL HIGH (ref 125–200)
HDL: 47 mg/dL (ref >=40)
LDL CALC: 150 mg/dL — AB (ref <130)
TRIGLYCERIDES: 251 mg/dL — AB (ref <150)
Total CHOL/HDL Ratio: 5.3 Ratio — ABNORMAL HIGH (ref <=5.0)
VLDL: 50 mg/dL — ABNORMAL HIGH (ref <30)

## 2015-08-30 LAB — COMPLETE METABOLIC PANEL WITH GFR
ALBUMIN: 2.8 g/dL — AB (ref 3.6–5.1)
ALK PHOS: 144 U/L — AB (ref 40–115)
ALT: 30 U/L (ref 9–46)
AST: 51 U/L — ABNORMAL HIGH (ref 10–35)
BUN: 14 mg/dL (ref 7–25)
CALCIUM: 8.9 mg/dL (ref 8.6–10.3)
CO2: 28 mmol/L (ref 20–31)
CREATININE: 1.07 mg/dL (ref 0.70–1.33)
Chloride: 95 mmol/L — ABNORMAL LOW (ref 98–110)
GFR, EST AFRICAN AMERICAN: 88 mL/min (ref >=60)
GFR, Est Non African American: 76 mL/min (ref >=60)
Glucose, Bld: 275 mg/dL — ABNORMAL HIGH (ref 70–99)
POTASSIUM: 4.4 mmol/L (ref 3.5–5.3)
Sodium: 134 mmol/L — ABNORMAL LOW (ref 135–146)
Total Bilirubin: 4.3 mg/dL — ABNORMAL HIGH (ref 0.2–1.2)
Total Protein: 7.5 g/dL (ref 6.1–8.1)

## 2015-08-30 LAB — HEMOGLOBIN A1C
Hgb A1c MFr Bld: 10.6 % — ABNORMAL HIGH (ref <5.7)
Mean Plasma Glucose: 258 mg/dL — ABNORMAL HIGH (ref <117)

## 2015-08-30 NOTE — Progress Notes (Signed)
Subjective:    Patient ID: Kyle [name], male    DOB: [birth year], 59 y.o.   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 He is on spironolactone as well as propranolol for this reason. He also takes Xifaxan to prevent peritonitis as well as lactulose to prevent encephalopathy. He also has type 2 diabetes mellitus on insulin. He takes Humulin R 500 units per mL 60 units 3 times a day. Patient states that his blood sugars typically range in the low 200s. He denies any hypoglycemic episodes. He denies any polyuria, polydipsia, or blurred vision. He does complain of neuropathic pain in his feet as well as in his hands. The neuropathic pain in his hand sounds more like ulnar neuropathy as it is limited to his left hand and limited to the fourth and fifth digit on the left hand. He was seeing Dr. Buddy Duty but can no longer see that practice due to an outstanding bill. Past Medical History  Diagnosis Date  . DM (diabetes mellitus) ([place])   . Cirrhosis ([place])     bx proven steatohepatitis with cirrhosis (2010); per note from Feb 2011, received Hep A and B vaccines in 2010.  [name] HTN (hypertension)   . RLS (restless legs syndrome)   . Hyperlipidemia   . IDA (iron deficiency anemia)   . GERD (gastroesophageal reflux disease)   . Depression   . Peripheral neuropathy ([place])   . Urothelial cancer ([place])     2010, paillary low-grade, h/o recurrence 2011  . B12 deficiency   . Psoriasis   . 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 Anemia due to multiple mechanisms 05/13/2011  . History of alcohol abuse 05/13/2011  . ANEMIA-IRON DEFICIENCY 03/09/2009  . S/P endoscopy May 2012    4 columns grade II esophageal varices; due for repeat in Nov 2013   . S/P colonoscopy May 2012    Tubular adenoma  . Low back pain     possible bulging disc, bone scan   . Cancer ([place])     bladder ca 05/2009 removal and  w/chemo wash  . Esophageal varices with  bleeding(456.0) 11/24/11    s/p emergent EGD 11/25/11 by Dr. Hilarie Fredrickson at [facility], Grade III esophageal varices s/p banding X 5  . Small bowel obstruction ([place]) 02/15/2012    Admitted to [facility], managed by Dr. Geroge Baseman, ventral hernia manually reduced  . Ventral hernia 02/15/12  . MRSA (methicillin resistant Staphylococcus aureus)   . Acute post-ligation esophageal ulcer with hemorrhage 04/11/2012  . Clostridium difficile infection   . H. pylori infection 12/24/2012  . Sleep apnea     [facility] took him off CPAP when had tips procedure  . Sleep apnea   . Cellulitis 04/2014    right upper leg  . Iron deficiency anemia secondary to blood loss (chronic) 03/09/2009    Secondary to GI blood loss- Rectal and esophageal varices, portal gastropathy, and esophageal ulcers    . Bladder cancer [facility])    Past Surgical History  Procedure Laterality Date  . Carpel tunnel Right   . Shoulder surgery Left   . Adrenal mass surgery  05/2009    benign, left  . Bladder surgery  01/2009 and 06/2010    cancer 2010, small recurrence in 06/2010  . Colonoscopy  04/2009    moderate int hemorrhoids, rare sigmoid diverticula, one mm sessile hyperplastic rectal polyp  . Esophagogastroduodenoscopy  03/2009    small hh  . Small bowel  capsule endoscopy  03/2009    couple of small benign appearing erosions, nonbleeding  . Egd/tcs  08/2007    small hiatal hernia, pancolonic diverticula, friable anal canal, 3cm salmon colored epithelium in distal esophagus, bx negative for Barrett's  . Skin cancer excision  Oct 2012    left arm  . Esophagogastroduodenoscopy  11/25/2011    Dr. Phineas Douglas III varices in the mid and distal esophagus, banding placed, portal astropathy  . Esophagogastroduodenoscopy  01/14/2012    Dr Rourk->4-5 columns Gr2 varices, 6 bands placed, HH, distal esophageal ulcer, portal gastropathy, antral erosions  . Esophagogastroduodenoscopy  03/17/2012    Dr. Larina Bras varices status post band ligation. Hiatal hernia. Portal  gastropathy.  . Umbilical hernia repair  04/05/2012    Procedure: HERNIA REPAIR UMBILICAL ADULT;  Surgeon: Donato Heinz, MD;  Location: [facility];  Service: General;  Laterality: N/A;  . Esophagogastroduodenoscopy  04/11/2012    Dr. Bridgett Larsson ulcer possibly at previous banding sites with stigmata     of bleeding.  Attempt at hemostasis with hemoclip and banding was not successful resulting in recurrence of bleed/  Portal gastropathy, but no evidence of gastric varices/ Recurrent esophageal varices grade 2.  . Esophagogastroduodenoscopy  05/24/2012    Dr. Larina Bras varices, portal gastropathy  . Esophagogastroduodenoscopy  04/11/12    Dr. Drue Novel in the distal esophagus adherent lot and visible vessel treated with bicap. grade I varices in the distal esophagus, one ligating band placed, portal hypertensive gastropathy in the body of the stomach.  . Colonoscopy  09/29/2012    ZYS:AYTKZS varices. Scattered pancolonic diverticula/single cecal polyp-removed as described above  . Esophagogastroduodenoscopy  12/24/2012    Dr. Lynwood Dawley significant upper GI bleed secondary to bleeding esophageal varices. s/p hemostasis therapy with injection of a total of 42mL of 5% ethanolamine and application of 9 bands, complete egd not carried out.  . Schlerotherapy  12/24/2012    Procedure: Woodward Ku OF VARICES;  Surgeon: Daneil Dolin, MD;  Location: [facility];  Service: Endoscopy;  Laterality: N/A;  . Esophageal banding  12/24/2012    [facility]:[id number] significant upper GI bleed secondary to bleeding esophageal varices  . Tips procedure  12/2012    [facility]  . Cataract extraction, bilateral    . Hernia repair      umbilical  . Eye surgery      bilateral cataract surgery with lens implants  . Transurethral resection of bladder tumor N/A 03/12/2015    Procedure: TRANSURETHRAL RESECTION OF BLADDER TUMOR (TURBT);  Surgeon: Raynelle Bring, MD;  Location: [facility];  Service: Urology;   Laterality: N/A;  . Cystoscopy w/ ureteral stent placement Bilateral 03/12/2015    Procedure: CYSTOSCOPY WITH BILATERAL RETROGRADE PYELOGRAM/RIGHT URETEROSCOPY;  Surgeon: Raynelle Bring, MD;  Location: [facility];  Service: Urology;  Laterality: Bilateral;  . Circumcision N/A 03/12/2015    Procedure: CIRCUMCISION ADULT;  Surgeon: Raynelle Bring, MD;  Location: [facility];  Service: Urology;  Laterality: N/A;   Current Outpatient Prescriptions on File Prior to Visit  Medication Sig Dispense Refill  . furosemide (LASIX) 40 MG tablet TAKE TWO TABLETS BY MOUTH ONCE DAILY IN THE MORNING 60 tablet 1  . gabapentin (NEURONTIN) 300 MG capsule TAKE ONE CAPSULE BY MOUTH IN THE MORNING, ONE CAPSULE AT NOON AND THREE CAPSULES AT BEDTIME. 150 capsule 2  . glucose blood (ACCU-CHEK AVIVA) test strip 1 each by Other route 3 (three) times daily. Use as instructed 100 each 12  . insulin regular human CONCENTRATED (HUMULIN R) 500 UNIT/ML  SOLN injection Inject 60 Units into the skin 3 (three) times daily.     [name] lactulose (CHRONULAC) 10 GM/15ML solution Take 15CC orally up to three times a day to achieve 2 to 3 soft BMs daily. (Patient taking differently: Take 10 g by mouth daily. ) 1892 mL 3  . omeprazole (PRILOSEC) 40 MG capsule Take 1 capsule (40 mg total) by mouth daily. 30 capsule 11  . oxyCODONE (ROXICODONE) 5 MG immediate release tablet Take 1 tablet (5 mg total) by mouth every 4 (four) hours as needed for severe pain. 8 tablet 0  . propranolol (INDERAL) 20 MG tablet Take 1 tablet (20 mg total) by mouth 2 (two) times daily. 60 tablet 5  . spironolactone (ALDACTONE) 50 MG tablet Take 50 mg by mouth daily.    [name] triamcinolone cream (KENALOG) 0.1 % Apply 1 application topically 2 (two) times daily. 30 g 0  . XIFAXAN 550 MG TABS tablet TAKE ONE TABLET BY MOUTH TWICE DAILY 60 tablet 5  . zolpidem (AMBIEN) 5 MG tablet Take 1-2 tablets (5-10 mg total) by mouth at bedtime as needed for sleep. 60 tablet 2  . potassium chloride 20  MEQ TBCR Take 20 mEq by mouth 2 (two) times daily. (Patient not taking: Reported on 08/30/2015) 10 tablet 0   No current facility-administered medications on file prior to visit.   Allergies  Allergen Reactions  . Aspirin Other (See Comments)    Harms liver.   . Ibuprofen Other (See Comments)    Can't take per Dr Gala Romney- harms livers  . Tylenol [Acetaminophen] Other (See Comments)    Restless Legs    Social History   Social History  . Marital Status: Married    Spouse Name: N/A  . Number of Children: 3  . Years of Education: N/A   Occupational History  . disabled    Social History Main Topics  . Smoking status: Current Every Day Smoker -- 1.00 packs/day for 30 years    Types: Cigarettes  . Smokeless tobacco: Never Used  . Alcohol Use: No     Comment: drank heavily for few years in 20s  . Drug Use: No  . Sexual Activity: Yes    Birth Control/ Protection: None   Other Topics Concern  . Not on file   Social History Narrative      Review of Systems  All other systems reviewed and are negative.      Objective:   Physical Exam  Constitutional: He appears well-developed and well-nourished.  Cardiovascular: Normal rate, regular rhythm and normal heart sounds.   No murmur heard. Pulmonary/Chest: Effort normal and breath sounds normal.  Abdominal: Soft. Bowel sounds are normal.  Musculoskeletal: He exhibits no edema.  Vitals reviewed.         Assessment & Plan:  Uncontrolled type 2 diabetes mellitus with diabetic neuropathy, unspecified long term insulin use status ([place]) - Plan: COMPLETE METABOLIC PANEL WITH GFR, CBC with Differential/Platelet, Lipid panel, Hemoglobin A1c, Microalbumin, urine  Given the severity of his cirrhosis, I would be satisfied with a hemoglobin A1c between 7 and 8. This would be to help prevent hypoglycemia and complications stemming from hypoglycemia. I will check a hemoglobin A1c. If A1c is greater than 8, I would increase his Humulin R  to compensate.  Patient's flu shot is already up-to-date. He cannot tolerate aspirin secondary to history of an upper GI bleed. His blood pressures well controlled. I will check a fasting lipid panel however I would not  treat his cholesterol given his history of cirrhosis unless his LDL cholesterol was astronomically high.

## 2015-08-31 LAB — CBC WITH DIFFERENTIAL/PLATELET
BASOS ABS: 0 10*3/uL (ref 0.0–0.1)
BASOS PCT: 0 % (ref 0–1)
EOS PCT: 2 % (ref 0–5)
Eosinophils Absolute: 0.1 10*3/uL (ref 0.0–0.7)
HEMATOCRIT: 39.6 % (ref 39.0–52.0)
Hemoglobin: 13.5 g/dL (ref 13.0–17.0)
LYMPHS PCT: 28 % (ref 12–46)
Lymphs Abs: 1.3 10*3/uL (ref 0.7–4.0)
MCH: 32.8 pg (ref 26.0–34.0)
MCHC: 34.1 g/dL (ref 30.0–36.0)
MCV: 96.4 fL (ref 78.0–100.0)
MPV: 9.8 fL (ref 8.6–12.4)
Monocytes Absolute: 0.6 10*3/uL (ref 0.1–1.0)
Monocytes Relative: 12 % (ref 3–12)
Neutro Abs: 2.7 10*3/uL (ref 1.7–7.7)
Neutrophils Relative %: 58 % (ref 43–77)
Platelets: 57 10*3/uL — ABNORMAL LOW (ref 150–400)
RBC: 4.11 MIL/uL — AB (ref 4.22–5.81)
RDW: 14.8 % (ref 11.5–15.5)
WBC: 4.6 10*3/uL (ref 4.0–10.5)

## 2015-08-31 LAB — MICROALBUMIN, URINE: MICROALB UR: 0.7 mg/dL (ref <2.0)

## 2015-09-03 ENCOUNTER — Other Ambulatory Visit: Payer: Self-pay | Admitting: Physician Assistant

## 2015-09-03 MED ORDER — INSULIN GLARGINE 300 UNIT/ML ~~LOC~~ SOPN: 40.0000 [IU] | PEN_INJECTOR | Freq: Every day | SUBCUTANEOUS | Status: DC

## 2015-09-04 ENCOUNTER — Telehealth: Payer: Self-pay | Admitting: Physician Assistant

## 2015-09-04 NOTE — Telephone Encounter (Signed)
Patient is calling to get clarification about taking his tajeo and his insulin   [phone]

## 2015-09-05 NOTE — Telephone Encounter (Signed)
Spoke to patient.  To take the new Toujeo once daily (this is his long acting insulin).  Continue the Humulin R 3 x day with meals.  Reinforce to check FBS and 2hr PP for 2 weeks and get back to [place] with results.

## 2015-09-06 ENCOUNTER — Telehealth: Payer: Self-pay | Admitting: *Deleted

## 2015-09-06 NOTE — Telephone Encounter (Signed)
Received request from pharmacy for [place] on [address].   PA submitted.   Dx: L45.6- DM with complicaitons  Patient has failed both Levemir and Lantus.   PA approved.   Ref # [id number]  Pharmacy made aware.

## 2015-09-07 ENCOUNTER — Telehealth: Payer: Self-pay | Admitting: Family Medicine

## 2015-09-07 NOTE — Telephone Encounter (Signed)
PA started on Cover My Meds but archived.  PA submitted thru [place] [employer] for [employer]  Conf# [id number]

## 2015-09-10 ENCOUNTER — Other Ambulatory Visit: Payer: Self-pay | Admitting: *Deleted

## 2015-09-10 MED ORDER — INSULIN PEN NEEDLE 29G X 12.7MM MISC: Status: AC

## 2015-09-10 NOTE — Telephone Encounter (Signed)
Toujeo approved 09/07/2015 - 09/06/2016   PA # [id number]  Faxed to pharmacy

## 2015-09-20 NOTE — Assessment & Plan Note (Signed)
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 Dutch Gray on 03/12/2015 and 1 hr infusion of Mitomycin C.  MRI of L spine demonstrated a L3 sclerotic lesion in the body of the vertebra.  Given the patient bladder cancer history and recurrence, bone scan was performed and the L3 lesion (in combination with MRI findings) is favored to be benign in origin.  Oncology history is updated to reflect most recent events.

## 2015-09-20 NOTE — Progress Notes (Signed)
Kyle Juba, PA-C [place] [address]  Iron deficiency anemia due to chronic blood loss - Plan: CBC with Differential, Iron and TIBC, Ferritin  Malignant neoplasm of ureteric orifice (HCC)  CURRENT THERAPY: Intermittent iron infusions last given on 10/13/2014 (510 mg Feraheme).  INTERVAL HISTORY: Kyle [name] 59 y.o. 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 GI issues followed by Dr. Gala [name].  I personally reviewed and went over laboratory results with the patient.  The results are noted within this dictation.  Labs will be updated today.  Hgb today is WNL.  His last iron infusion was nearly 12 months ago.  Chart reviewed.  ED visit noted.  Follow-up with Dr. Dennard [name] is also reviewed.  He denies any blood loss including blood in stool, black stool, hematuria, hemoptysis, hematemesis, and other forms of blood loss.  He notes chronic fatigue that is secondary to his chronic ailments.  He is at baseline.  He had a few unrelated questions to his IDA and these were answered to his and his wife's satisfaction.   Past Medical History  Diagnosis Date  . DM (diabetes mellitus) ([place])   . Cirrhosis ([place])     bx proven steatohepatitis with cirrhosis (2010); per note from Feb 2011, received Hep A and B vaccines in 2010.  [name] HTN (hypertension)   . RLS (restless legs syndrome)   . Hyperlipidemia   . IDA (iron deficiency anemia)   . GERD (gastroesophageal reflux disease)   . Depression   . Peripheral neuropathy ([place])   . Urothelial cancer ([place])     2010, paillary low-grade, h/o recurrence 2011  . B12 deficiency   . Psoriasis   . 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 Anemia due to multiple mechanisms 05/13/2011  . History of alcohol abuse 05/13/2011  . ANEMIA-IRON DEFICIENCY 03/09/2009  . S/P endoscopy May 2012    4  columns grade II esophageal varices; due for repeat in Nov 2013   . S/P colonoscopy May 2012    Tubular adenoma  . Low back pain     possible bulging disc, bone scan   . Cancer ([place])     bladder ca 05/2009 removal and  w/chemo wash  . Esophageal varices with bleeding(456.0) 11/24/11    s/p emergent EGD 11/25/11 by Kyle [name] at [facility], Grade [name] esophageal varices s/p banding X 5  . Small bowel obstruction ([place]) 02/15/2012    Admitted to [facility], managed by Dr. Geroge [name], ventral hernia manually reduced  . Ventral hernia 02/15/12  . MRSA (methicillin resistant Staphylococcus aureus)   . Acute post-ligation esophageal ulcer with hemorrhage 04/11/2012  . Clostridium difficile infection   . H. pylori infection 12/24/2012  . Sleep apnea     [facility] took him off CPAP when had tips procedure  . Sleep apnea   . Cellulitis 04/2014    right upper leg  . Iron deficiency anemia secondary to blood loss (chronic) 03/09/2009    Secondary to GI blood loss- Rectal and esophageal varices, portal gastropathy, and esophageal ulcers    . Bladder cancer ([place])     has Malignant neoplasm of bladder ([place]); DIABETES MELLITUS, TYPE II, CONTROLLED, WITH COMPLICATIONS; ADRENAL MASS; HYPERLIPIDEMIA; Iron deficiency anemia due to chronic blood loss; ANEMIA, VITAMIN B12 DEFICIENCY; SMOKER; DEPRESSION; SLEEP APNEA, OBSTRUCTIVE, MODERATE; RESTLESS LEG SYNDROME; HYPERTENSION; GERD; Hepatic cirrhosis (  [place]); Other chronic nonalcoholic liver disease; ARTHRITIS; SHOULDER PAIN, LEFT; INSOMNIA; FATIGUE; WEIGHT GAIN; CHEST DISCOMFORT; PROTEINURIA; ABNORMAL ELECTROCARDIOGRAM; Thrombocytopenia due to hypersplenism; Anemia due to multiple mechanisms; History of alcohol abuse; Abdominal pain; External hemorrhoid; Upper GI bleed; Hypotension; Diarrhea; Esophageal varices ([place]); Umbilical hernia; History of small bowel obstruction; Acute post-ligation esophageal ulcer with hemorrhage; Acute posthemorrhagic anemia; Acute respiratory failure ([place]); UTI  (urinary tract infection); Hyperkalemia; H. pylori infection; Microscopic hematuria; Hemorrhagic shock; Respiratory failure with hypercapnia ([place]); Aspiration pneumonia ([place]); SIRS (systemic inflammatory response syndrome) ([place]); Seizure ([place]); Fever, unspecified; Chronic kidney disease, stage II (mild); Periumbilical abdominal pain; Diabetic eye exam ([place]); and Bladder cancer ([place]) on his problem list.     is allergic to aspirin; ibuprofen; and tylenol.  Kyle [name] does not currently have medications on file.  Past Surgical History  Procedure Laterality Date  . Carpel tunnel Right   . Shoulder surgery Left   . Adrenal mass surgery  05/2009    benign, left  . Bladder surgery  01/2009 and 06/2010    cancer 2010, small recurrence in 06/2010  . Colonoscopy  04/2009    moderate int hemorrhoids, rare sigmoid diverticula, one mm sessile hyperplastic rectal polyp  . Esophagogastroduodenoscopy  03/2009    small hh  . Small bowel capsule endoscopy  03/2009    couple of small benign appearing erosions, nonbleeding  . Egd/tcs  08/2007    small hiatal hernia, pancolonic diverticula, friable anal canal, 3cm salmon colored epithelium in distal esophagus, bx negative for Barrett's  . Skin cancer excision  Oct 2012    left arm  . Esophagogastroduodenoscopy  11/25/2011    Dr. Phineas Douglas [name] varices in the mid and distal esophagus, banding placed, portal astropathy  . Esophagogastroduodenoscopy  01/14/2012    Dr Kyle [name]->4-5 columns Gr2 varices, 6 bands placed, HH, distal esophageal ulcer, portal gastropathy, antral erosions  . Esophagogastroduodenoscopy  03/17/2012    Dr. Larina [name] varices status post band ligation. Hiatal hernia. Portal gastropathy.  . Umbilical hernia repair  04/05/2012    Procedure: HERNIA REPAIR UMBILICAL ADULT;  Surgeon: Kyle Heinz, MD;  Location: [facility];  Service: General;  Laterality: N/A;  . Esophagogastroduodenoscopy  04/11/2012    Dr. Bridgett [name] ulcer possibly at  previous banding sites with stigmata     of bleeding.  Attempt at hemostasis with hemoclip and banding was not successful resulting in recurrence of bleed/  Portal gastropathy, but no evidence of gastric varices/ Recurrent esophageal varices grade 2.  . Esophagogastroduodenoscopy  05/24/2012    Dr. Larina [name] varices, portal gastropathy  . Esophagogastroduodenoscopy  04/11/12    Dr. Drue Novel in the distal esophagus adherent lot and visible vessel treated with bicap. grade I varices in the distal esophagus, one ligating band placed, portal hypertensive gastropathy in the body of the stomach.  . Colonoscopy  09/29/2012    OYD:XAJOIN varices. Scattered pancolonic diverticula/single cecal polyp-removed as described above  . Esophagogastroduodenoscopy  12/24/2012    Dr. Lynwood Dawley significant upper GI bleed secondary to bleeding esophageal varices. s/p hemostasis therapy with injection of a total of 104mL of 5% ethanolamine and application of 9 bands, complete egd not carried out.  . Schlerotherapy  12/24/2012    Procedure: Woodward Ku OF VARICES;  Surgeon: Daneil Dolin, MD;  Location: [facility];  Service: Endoscopy;  Laterality: N/A;  . Esophageal banding  12/24/2012    OMV:[id number] significant upper GI bleed secondary to bleeding esophageal varices  . Tips procedure  12/2012    [facility]  .  Cataract extraction, bilateral    . Hernia repair      umbilical  . Eye surgery      bilateral cataract surgery with lens implants  . Transurethral resection of bladder tumor N/A 03/12/2015    Procedure: TRANSURETHRAL RESECTION OF BLADDER TUMOR (TURBT);  Surgeon: Raynelle Bring, MD;  Location: [facility];  Service: Urology;  Laterality: N/A;  . Cystoscopy w/ ureteral stent placement Bilateral 03/12/2015    Procedure: CYSTOSCOPY WITH BILATERAL RETROGRADE PYELOGRAM/RIGHT URETEROSCOPY;  Surgeon: Raynelle Bring, MD;  Location: [facility];  Service: Urology;  Laterality: Bilateral;  . Circumcision N/A  03/12/2015    Procedure: CIRCUMCISION ADULT;  Surgeon: Raynelle Bring, MD;  Location: [facility];  Service: Urology;  Laterality: N/A;    Denies any headaches, dizziness, double vision, fevers, chills, night sweats, nausea, vomiting, diarrhea, constipation, chest pain, heart palpitations, shortness of breath, blood in stool, black tarry stool, urinary pain, urinary burning, urinary frequency, hematuria.   PHYSICAL EXAMINATION  ECOG PERFORMANCE STATUS: 1 - Symptomatic but completely ambulatory  Filed Vitals:   09/21/15 0954  BP: 112/66  Pulse: 72  Temp: 98.1 F (36.7 C)  Resp: 18    GENERAL:alert, no distress, well nourished, well developed, comfortable, cooperative, obese and smiling, accompanied by his wife. SKIN: skin color, texture, turgor are normal, no rashes or significant lesions HEAD: Normocephalic, No masses, lesions, tenderness or abnormalities EYES: normal, PERRLA, EOMI, Conjunctiva are pink and non-injected EARS: External ears normal OROPHARYNX:lips, buccal mucosa, and tongue normal and mucous membranes are moist  NECK: supple, no adenopathy, thyroid normal size, non-tender, without nodularity, no stridor, non-tender, trachea midline LYMPH:  no palpable lymphadenopathy BREAST:not examined LUNGS: CTA B/L with decreased breath sounds B/L HEART: RRR without murmur ABDOMEN:obese BACK: Back symmetric, no curvature. EXTREMITIES:less then 2 second capillary refill, no joint deformities, effusion, or inflammation, no skin discoloration, positive findings: B/L LE edema, 1-2+ pitting pre-tibially.  NEURO: alert & oriented x 3 with fluent speech, normal ambulation.    LABORATORY DATA: CBC    Component Value Date/Time   WBC 6.4 09/21/2015 0946   RBC 4.01* 09/21/2015 0946   RBC 3.97* 12/18/2010 1235   HGB 13.4 09/21/2015 0946   HCT 38.7* 09/21/2015 0946   PLT 61* 09/21/2015 0946   MCV 96.5 09/21/2015 0946   MCH 33.4 09/21/2015 0946   MCHC 34.6 09/21/2015 0946   RDW 14.3  09/21/2015 0946   LYMPHSABS 1.8 09/21/2015 0946   MONOABS 0.7 09/21/2015 0946   EOSABS 0.1 09/21/2015 0946   BASOSABS 0.0 09/21/2015 0946      Chemistry      Component Value Date/Time   NA 134* 08/30/2015 1253   K 4.4 08/30/2015 1253   CL 95* 08/30/2015 1253   CO2 28 08/30/2015 1253   BUN 14 08/30/2015 1253   CREATININE 1.07 08/30/2015 1253   CREATININE 1.36* 07/02/2015 1234      Component Value Date/Time   CALCIUM 8.9 08/30/2015 1253   ALKPHOS 144* 08/30/2015 1253   AST 51* 08/30/2015 1253   ALT 30 08/30/2015 1253   BILITOT 4.3* 08/30/2015 1253     Lab Results  Component Value Date   IRON 174 06/22/2015   TIBC 242* 06/22/2015   FERRITIN 203 06/22/2015    RADIOGRAPHIC STUDIES:  No results found.   PATHOLOGY:   Diagnosis 1. Bladder, transurethral resection, right posterior bladder tumor LOW GRADE PAPILLARY UROTHELIAL CARCINOMA. NO EVIDENCE OF STROMAL INVASION. MUSCULARIS PROPRIA NOT PRESENT FOR EVALUATION. 2. Foreskin, adult FORESKIN TISSUE WITH UNDERLYING STROMAL  FIBROSIS. NO EVIDENCE OF DYSPLASIA OR MALIGNANCY. 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 GI issues followed by Dr. Gala [name].  Labs will be updated today: CBC diff, iron/TIBC, ferritin.  Labs every 3 months: CBC diff, iron/TIBC, ferritin  Encouraged continued follow-up with primary care provider and GI specialist.  The patient had some questions about his LE edema and Erectile dysfunction.  His LE edema sounds to be independent.  He is on Lasix and Spironolactone.  I have no further recommendations regarding this and have deferred this to his primary care provider.  I provided his education regarding ED and the role of the prostate in sexual health (as he  asked).  He has a number of co-morbidities that are likely hindering his ability to have an erection and maintain an erection.  We reviewed these.  I deferred this to his primary care provider as well since he has tried Cialis and Viagra in the past without success.  Return in 6 months for follow-up.   Malignant neoplasm of bladder Low grade papillary urothelial carcinoma without evidence of stromal invasion, S/P resection by Dr. Dutch Gray on 03/12/2015 and 1 hr infusion of Mitomycin C.  MRI of L spine demonstrated a L3 sclerotic lesion in the body of the vertebra.  Given the patient bladder cancer history and recurrence, bone scan was performed and the L3 lesion (in combination with MRI findings) is favored to be benign in origin.  Oncology history is updated to reflect most recent events.   THERAPY PLAN:  We will monitor counts and continue to provide iron support when indicated.   All questions were answered. The patient knows to call the clinic with any problems, questions or concerns. We can certainly see the patient much sooner if necessary.  Patient and plan discussed with Dr. Ancil Linsey and she is in agreement with the aforementioned.   This note is electronically signed by: Robynn Pane 09/21/2015 10:52 AM

## 2015-09-20 NOTE — Assessment & Plan Note (Addendum)
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 GI issues followed by Dr. Gala Romney.  Labs will be updated today: CBC diff, iron/TIBC, ferritin.  Labs every 3 months: CBC diff, iron/TIBC, ferritin  Encouraged continued follow-up with primary care provider and GI specialist.  The patient had some questions about his LE edema and Erectile dysfunction.  His LE edema sounds to be independent.  He is on Lasix and Spironolactone.  I have no further recommendations regarding this and have deferred this to his primary care provider.  I provided his education regarding ED and the role of the prostate in sexual health (as he asked).  He has a number of co-morbidities that are likely hindering his ability to have an erection and maintain an erection.  We reviewed these.  I deferred this to his primary care provider as well since he has tried Cialis and Viagra in the past without success.  Return in 6 months for follow-up.

## 2015-09-21 ENCOUNTER — Encounter (HOSPITAL_BASED_OUTPATIENT_CLINIC_OR_DEPARTMENT_OTHER): Payer: Medicaid Other | Admitting: Oncology

## 2015-09-21 ENCOUNTER — Encounter (HOSPITAL_COMMUNITY): Payer: Medicaid Other | Attending: Hematology & Oncology

## 2015-09-21 ENCOUNTER — Encounter (HOSPITAL_COMMUNITY): Payer: Self-pay | Admitting: Oncology

## 2015-09-21 ENCOUNTER — Ambulatory Visit (HOSPITAL_COMMUNITY): Payer: Medicaid Other | Admitting: Hematology & Oncology

## 2015-09-21 VITALS — BP 112/66 | HR 72 | Temp 98.1°F | Resp 18 | Wt 272.4 lb

## 2015-09-21 DIAGNOSIS — K7581 Nonalcoholic steatohepatitis (NASH): Secondary | ICD-10-CM | POA: Diagnosis not present

## 2015-09-21 DIAGNOSIS — C676 Malignant neoplasm of ureteric orifice: Secondary | ICD-10-CM

## 2015-09-21 DIAGNOSIS — D500 Iron deficiency anemia secondary to blood loss (chronic): Secondary | ICD-10-CM | POA: Insufficient documentation

## 2015-09-21 DIAGNOSIS — D61818 Other pancytopenia: Secondary | ICD-10-CM

## 2015-09-21 DIAGNOSIS — D731 Hypersplenism: Secondary | ICD-10-CM | POA: Diagnosis not present

## 2015-09-21 LAB — CBC WITH DIFFERENTIAL/PLATELET
Basophils Absolute: 0 10*3/uL (ref 0.0–0.1)
Basophils Relative: 0 %
Eosinophils Absolute: 0.1 10*3/uL (ref 0.0–0.7)
Eosinophils Relative: 2 %
HEMATOCRIT: 38.7 % — AB (ref 39.0–52.0)
HEMOGLOBIN: 13.4 g/dL (ref 13.0–17.0)
LYMPHS ABS: 1.8 10*3/uL (ref 0.7–4.0)
LYMPHS PCT: 29 %
MCH: 33.4 pg (ref 26.0–34.0)
MCHC: 34.6 g/dL (ref 30.0–36.0)
MCV: 96.5 fL (ref 78.0–100.0)
Monocytes Absolute: 0.7 10*3/uL (ref 0.1–1.0)
Monocytes Relative: 11 %
NEUTROS PCT: 58 %
Neutro Abs: 3.7 10*3/uL (ref 1.7–7.7)
PLATELETS: 61 10*3/uL — AB (ref 150–400)
RBC: 4.01 MIL/uL — AB (ref 4.22–5.81)
RDW: 14.3 % (ref 11.5–15.5)
WBC: 6.4 10*3/uL (ref 4.0–10.5)

## 2015-09-21 LAB — IRON AND TIBC
Iron: 103 ug/dL (ref 45–182)
SATURATION RATIOS: 34 % (ref 17.9–39.5)
TIBC: 305 ug/dL (ref 250–450)
UIBC: 202 ug/dL

## 2015-09-21 LAB — FERRITIN: Ferritin: 52 ng/mL (ref 24–336)

## 2015-09-21 NOTE — Patient Instructions (Signed)
[place] at [facility] Discharge Instructions  RECOMMENDATIONS MADE BY THE CONSULTANT AND ANY TEST RESULTS WILL BE SENT TO YOUR REFERRING PHYSICIAN.  Exam and discussion by Robynn Pane, PA-C Will let you know if labs are of concern Call with any concerns  Follow-up: Labs every 3 months and office visit in 6 months.  Thank you for choosing [place] at [facility] to provide your oncology and hematology care.  To afford each patient quality time with our provider, please arrive at least 15 minutes before your scheduled appointment time.    You need to re-schedule your appointment should you arrive 10 or more minutes late.  We strive to give you quality time with our providers, and arriving late affects you and other patients whose appointments are after yours.  Also, if you no show three or more times for appointments you may be dismissed from the clinic at the providers discretion.     Again, thank you for choosing [facility].  Our hope is that these requests will decrease the amount of time that you wait before being seen by our physicians.       _____________________________________________________________  Should you have questions after your visit to [facility], please contact our office at [phone] between the hours of 8:30 a.m. and 4:30 p.m.  Voicemails left after 4:30 p.m. will not be returned until the following business day.  For prescription refill requests, have your pharmacy contact our office.

## 2015-09-21 NOTE — Progress Notes (Signed)
Labs drawn

## 2015-09-22 ENCOUNTER — Other Ambulatory Visit (HOSPITAL_COMMUNITY): Payer: Self-pay | Admitting: Oncology

## 2015-09-22 DIAGNOSIS — D500 Iron deficiency anemia secondary to blood loss (chronic): Secondary | ICD-10-CM

## 2015-09-30 ENCOUNTER — Other Ambulatory Visit (HOSPITAL_COMMUNITY): Payer: Self-pay | Admitting: Oncology

## 2015-10-01 ENCOUNTER — Encounter (HOSPITAL_COMMUNITY): Payer: Self-pay

## 2015-10-01 ENCOUNTER — Encounter (HOSPITAL_COMMUNITY): Payer: Medicaid Other | Attending: Hematology & Oncology

## 2015-10-01 VITALS — BP 117/57 | HR 74 | Temp 97.5°F | Resp 18

## 2015-10-01 DIAGNOSIS — D500 Iron deficiency anemia secondary to blood loss (chronic): Secondary | ICD-10-CM | POA: Diagnosis not present

## 2015-10-01 MED ORDER — SODIUM CHLORIDE 0.9 % IV SOLN
510.0000 mg | Freq: Once | INTRAVENOUS | Status: AC
  Administered 2015-10-01: 510 mg via INTRAVENOUS
  Filled 2015-10-01: qty 17

## 2015-10-01 MED ORDER — SODIUM CHLORIDE 0.9 % IJ SOLN
10.0000 mL | Freq: Once | INTRAMUSCULAR | Status: AC
  Administered 2015-10-01: 10 mL via INTRAVENOUS

## 2015-10-01 MED ORDER — SODIUM CHLORIDE 0.9 % IV SOLN
Freq: Once | INTRAVENOUS | Status: AC
  Administered 2015-10-01: 13:00:00 via INTRAVENOUS

## 2015-10-01 NOTE — Patient Instructions (Signed)
[place] at [facility] Discharge Instructions  RECOMMENDATIONS MADE BY THE CONSULTANT AND ANY TEST RESULTS WILL BE SENT TO YOUR REFERRING PHYSICIAN.  Iron infusion well today Return as scheduled Please call the clinic if you have any questions or concerns  Thank you for choosing [place] at [facility] to provide your oncology and hematology care.  To afford each patient quality time with our provider, please arrive at least 15 minutes before your scheduled appointment time.    You need to re-schedule your appointment should you arrive 10 or more minutes late.  We strive to give you quality time with our providers, and arriving late affects you and other patients whose appointments are after yours.  Also, if you no show three or more times for appointments you may be dismissed from the clinic at the providers discretion.     Again, thank you for choosing [facility].  Our hope is that these requests will decrease the amount of time that you wait before being seen by our physicians.       _____________________________________________________________  Should you have questions after your visit to [facility], please contact our office at [phone] between the hours of 8:30 a.m. and 4:30 p.m.  Voicemails left after 4:30 p.m. will not be returned until the following business day.  For prescription refill requests, have your pharmacy contact our office.

## 2015-10-01 NOTE — Progress Notes (Signed)
Clyde Lundborg Tolerated iron infusion well today Discharged ambulatory

## 2015-10-29 ENCOUNTER — Encounter: Payer: Self-pay | Admitting: Family Medicine

## 2015-10-29 ENCOUNTER — Ambulatory Visit (INDEPENDENT_AMBULATORY_CARE_PROVIDER_SITE_OTHER): Payer: Medicaid Other | Admitting: Family Medicine

## 2015-10-29 VITALS — BP 136/84 | HR 82 | Temp 98.0°F | Resp 18 | Wt 269.0 lb

## 2015-10-29 DIAGNOSIS — G4700 Insomnia, unspecified: Secondary | ICD-10-CM | POA: Diagnosis not present

## 2015-10-29 MED ORDER — SUVOREXANT 10 MG PO TABS
10.0000 mg | ORAL_TABLET | Freq: Every evening | ORAL | Status: DC | PRN
Start: 2015-10-29 — End: 2016-03-21

## 2015-10-29 NOTE — Progress Notes (Signed)
Subjective:    Patient ID: Kyle [name], male    DOB: 03/18/1956, 59 y.o.   MRN: [id number]  HPI Patient is a 58 year old white male with a history of cirrhosis secondary to alcohol as well as fatty liver disease. He also has a history of uncontrolled type 2 insulin-dependent diabetes mellitus. Since starting toujeo 40 units a day, he states his blood sugars under 200. He has not brought any values to see. I have asked him to do that so that I can adjust the toujeo further.  He is currently on Ambien 5-10 mg by mouth daily at bedtime for insomnia. He is also taking oxycodone for chronic low back pain. He states that 5 mg of Ambien does not help him sleep. However when he takes 10 mg of Ambien he will sleep all or perform other activities with no recollection. His wife checks that she will catch him in the kitchen eating and he will not remember it. He would like to find an alternative to the Ambien. However obviously this is complicated by his cirrhosis as well as his narcotic use Past Medical History  Diagnosis Date  . DM (diabetes mellitus) ([place])   . Cirrhosis ([place])     bx proven steatohepatitis with cirrhosis (2010); per note from Feb 2011, received Hep A and B vaccines in 2010.  [name] HTN (hypertension)   . RLS (restless legs syndrome)   . Hyperlipidemia   . IDA (iron deficiency anemia)   . GERD (gastroesophageal reflux disease)   . Depression   . Peripheral neuropathy ([place])   . Urothelial cancer ([place])     2010, paillary low-grade, h/o recurrence 2011  . B12 deficiency   . Psoriasis   . 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 Anemia due to multiple mechanisms 05/13/2011  . History of alcohol abuse 05/13/2011  . ANEMIA-IRON DEFICIENCY 03/09/2009  . S/P endoscopy May 2012    4 columns grade II esophageal varices; due for repeat in Nov 2013   . S/P colonoscopy May 2012    Tubular adenoma  . Low back pain     possible bulging disc, bone scan   . Cancer ([place])     bladder ca  05/2009 removal and  w/chemo wash  . Esophageal varices with bleeding(456.0) 11/24/11    s/p emergent EGD 11/25/11 by Dr. Hilarie Fredrickson at [facility], Grade III esophageal varices s/p banding X 5  . Small bowel obstruction ([place]) 02/15/2012    Admitted to [facility], managed by Dr. Geroge Baseman, ventral hernia manually reduced  . Ventral hernia 02/15/12  . MRSA (methicillin resistant Staphylococcus aureus)   . Acute post-ligation esophageal ulcer with hemorrhage 04/11/2012  . Clostridium difficile infection   . H. pylori infection 12/24/2012  . Sleep apnea     [facility] took him off CPAP when had tips procedure  . Sleep apnea   . Cellulitis 04/2014    right upper leg  . Iron deficiency anemia secondary to blood loss (chronic) 03/09/2009    Secondary to GI blood loss- Rectal and esophageal varices, portal gastropathy, and esophageal ulcers    . Bladder cancer [facility])    Past Surgical History  Procedure Laterality Date  . Carpel tunnel Right   . Shoulder surgery Left   . Adrenal mass surgery  05/2009    benign, left  . Bladder surgery  01/2009 and 06/2010    cancer 2010, small recurrence in 06/2010  . Colonoscopy  04/2009    moderate int hemorrhoids, rare sigmoid  diverticula, one mm sessile hyperplastic rectal polyp  . Esophagogastroduodenoscopy  03/2009    small hh  . Small bowel capsule endoscopy  03/2009    couple of small benign appearing erosions, nonbleeding  . Egd/tcs  08/2007    small hiatal hernia, pancolonic diverticula, friable anal canal, 3cm salmon colored epithelium in distal esophagus, bx negative for Barrett's  . Skin cancer excision  Oct 2012    left arm  . Esophagogastroduodenoscopy  11/25/2011    Dr. Phineas Douglas III varices in the mid and distal esophagus, banding placed, portal astropathy  . Esophagogastroduodenoscopy  01/14/2012    Dr Rourk->4-5 columns Gr2 varices, 6 bands placed, HH, distal esophageal ulcer, portal gastropathy, antral erosions  . Esophagogastroduodenoscopy  03/17/2012    Dr.  Larina Bras varices status post band ligation. Hiatal hernia. Portal gastropathy.  . Umbilical hernia repair  04/05/2012    Procedure: HERNIA REPAIR UMBILICAL ADULT;  Surgeon: Donato Heinz, MD;  Location: [facility];  Service: General;  Laterality: N/A;  . Esophagogastroduodenoscopy  04/11/2012    Dr. Bridgett Larsson ulcer possibly at previous banding sites with stigmata     of bleeding.  Attempt at hemostasis with hemoclip and banding was not successful resulting in recurrence of bleed/  Portal gastropathy, but no evidence of gastric varices/ Recurrent esophageal varices grade 2.  . Esophagogastroduodenoscopy  05/24/2012    Dr. Larina Bras varices, portal gastropathy  . Esophagogastroduodenoscopy  04/11/12    Dr. Drue Novel in the distal esophagus adherent lot and visible vessel treated with bicap. grade I varices in the distal esophagus, one ligating band placed, portal hypertensive gastropathy in the body of the stomach.  . Colonoscopy  09/29/2012    [id number] varices. Scattered pancolonic diverticula/single cecal polyp-removed as described above  . Esophagogastroduodenoscopy  12/24/2012    Dr. Lynwood Dawley significant upper GI bleed secondary to bleeding esophageal varices. s/p hemostasis therapy with injection of a total of 52mL of 5% ethanolamine and application of 9 bands, complete egd not carried out.  . Schlerotherapy  12/24/2012    Procedure: Woodward Ku OF VARICES;  Surgeon: Daneil Dolin, MD;  Location[facility];  Service: Endoscopy;  Laterality: N/A;  . Esophageal banding  12/24/2012    [id number] significant upper GI bleed secondary to bleeding esophageal varices  . Tips procedure  12/2012    [facility]  . Cataract extraction, bilateral    . Hernia repair      umbilical  . Eye surgery      bilateral cataract surgery with lens implants  . Transurethral resection of bladder tumor N/A 03/12/2015    Procedure: TRANSURETHRAL RESECTION OF BLADDER TUMOR (TURBT);   Surgeon: Raynelle Bring, MD;  Location: [facility];  Service: Urology;  Laterality: N/A;  . Cystoscopy w/ ureteral stent placement Bilateral 03/12/2015    Procedure: CYSTOSCOPY WITH BILATERAL RETROGRADE PYELOGRAM/RIGHT URETEROSCOPY;  Surgeon: Raynelle Bring, MD;  Location: [facility];  Service: Urology;  Laterality: Bilateral;  . Circumcision N/A 03/12/2015    Procedure: CIRCUMCISION ADULT;  Surgeon: Raynelle Bring, MD;  Location: [facility];  Service: Urology;  Laterality: N/A;   Current Outpatient Prescriptions on File Prior to Visit  Medication Sig Dispense Refill  . furosemide (LASIX) 40 MG tablet TAKE TWO TABLETS BY MOUTH ONCE DAILY IN THE MORNING 60 tablet 0  . gabapentin (NEURONTIN) 300 MG capsule TAKE ONE CAPSULE BY MOUTH IN THE MORNING, ONE CAPSULE AT NOON AND THREE CAPSULES AT BEDTIME. 150 capsule 2  . glucose blood (ACCU-CHEK AVIVA) test strip 1 each by Other  route 3 (three) times daily. Use as instructed 100 each 12  . Insulin Glargine (TOUJEO SOLOSTAR) 300 UNIT/ML SOPN Inject 40 Units into the skin daily. 3 pen 1  . Insulin Pen Needle 29G X 12.7MM MISC Use with Toujeo solostar 100 each 11  . insulin regular human CONCENTRATED (HUMULIN R) 500 UNIT/ML SOLN injection Inject 60 Units into the skin 3 (three) times daily.     [name] lactulose (CHRONULAC) 10 GM/15ML solution Take 15CC orally up to three times a day to achieve 2 to 3 soft BMs daily. (Patient taking differently: Take 10 g by mouth daily. ) 1892 mL 3  . omeprazole (PRILOSEC) 40 MG capsule Take 1 capsule (40 mg total) by mouth daily. 30 capsule 11  . oxyCODONE (ROXICODONE) 5 MG immediate release tablet Take 1 tablet (5 mg total) by mouth every 4 (four) hours as needed for severe pain. 8 tablet 0  . potassium chloride 20 MEQ TBCR Take 20 mEq by mouth 2 (two) times daily. 10 tablet 0  . propranolol (INDERAL) 20 MG tablet Take 1 tablet (20 mg total) by mouth 2 (two) times daily. 60 tablet 5  . spironolactone (ALDACTONE) 50 MG tablet Take 50 mg by mouth  daily.    [name] triamcinolone cream (KENALOG) 0.1 % Apply 1 application topically 2 (two) times daily. 30 g 0  . XIFAXAN 550 MG TABS tablet TAKE ONE TABLET BY MOUTH TWICE DAILY 60 tablet 5  . zolpidem (AMBIEN) 5 MG tablet Take 1-2 tablets (5-10 mg total) by mouth at bedtime as needed for sleep. 60 tablet 2   No current facility-administered medications on file prior to visit.   Allergies  Allergen Reactions  . Aspirin Other (See Comments)    Harms liver.   . Ibuprofen Other (See Comments)    Can't take per Dr Gala Romney- harms livers  . Tylenol [Acetaminophen] Other (See Comments)    Restless Legs    Social History   Social History  . Marital Status: Married    Spouse Name: N/A  . Number of Children: 3  . Years of Education: N/A   Occupational History  . disabled    Social History Main Topics  . Smoking status: Current Every Day Smoker -- 1.00 packs/day for 30 years    Types: Cigarettes  . Smokeless tobacco: Never Used  . Alcohol Use: No     Comment: drank heavily for few years in 20s  . Drug Use: No  . Sexual Activity: Yes    Birth Control/ Protection: None   Other Topics Concern  . Not on file   Social History Narrative      Review of Systems  All other systems reviewed and are negative.      Objective:   Physical Exam  Eyes: Scleral icterus is present.  Cardiovascular: Normal rate, regular rhythm and normal heart sounds.   Pulmonary/Chest: Effort normal and breath sounds normal. No respiratory distress. He has no wheezes. He has no rales.  Abdominal: Soft. Bowel sounds are normal.  Vitals reviewed.         Assessment & Plan:  Insomnia - Plan: Suvorexant (BELSOMRA) 10 MG TABS  Discontinue Ambien and replace with belsomra 10 mg poqhs.  Recheck in one month. Patient is supposed to bring in fasting blood sugars and two-hour postprandial sugars for me to review so that I can further make adjustments in his insulin dose. At present he is on toujeo 40 units in the  morning and Humulin R 60  units with meals.

## 2015-11-01 ENCOUNTER — Other Ambulatory Visit (HOSPITAL_COMMUNITY): Payer: Self-pay | Admitting: Oncology

## 2015-11-01 ENCOUNTER — Other Ambulatory Visit: Payer: Self-pay | Admitting: Gastroenterology

## 2015-11-01 ENCOUNTER — Telehealth: Payer: Self-pay | Admitting: *Deleted

## 2015-11-01 NOTE — Telephone Encounter (Signed)
Received request from pharmacy for PA on [place]  PA submitted.  Dx: Insomnia

## 2015-11-02 NOTE — Telephone Encounter (Signed)
Received PA determination.   PA approved 11/01/2015- 04/29/2016.  Ref # [id number] W.  Pharmacy made aware.

## 2015-11-14 ENCOUNTER — Other Ambulatory Visit (HOSPITAL_COMMUNITY): Payer: Self-pay | Admitting: Oncology

## 2015-11-28 ENCOUNTER — Other Ambulatory Visit: Payer: Self-pay | Admitting: Physician Assistant

## 2015-11-28 ENCOUNTER — Other Ambulatory Visit (HOSPITAL_COMMUNITY): Payer: Self-pay | Admitting: Oncology

## 2015-11-29 NOTE — Telephone Encounter (Signed)
Medication refilled per protocol. 

## 2015-12-10 ENCOUNTER — Encounter (HOSPITAL_COMMUNITY): Payer: Self-pay | Admitting: Emergency Medicine

## 2015-12-10 ENCOUNTER — Emergency Department (HOSPITAL_COMMUNITY)
Admission: EM | Admit: 2015-12-10 | Discharge: 2015-12-11 | Disposition: A | Discharge status: Home / Self Care | Payer: Medicaid Other | Attending: Emergency Medicine | Admitting: Emergency Medicine

## 2015-12-10 DIAGNOSIS — Z792 Long term (current) use of antibiotics: Secondary | ICD-10-CM | POA: Diagnosis not present

## 2015-12-10 DIAGNOSIS — D696 Thrombocytopenia, unspecified: Secondary | ICD-10-CM | POA: Insufficient documentation

## 2015-12-10 DIAGNOSIS — Z8619 Personal history of other infectious and parasitic diseases: Secondary | ICD-10-CM | POA: Diagnosis not present

## 2015-12-10 DIAGNOSIS — Z872 Personal history of diseases of the skin and subcutaneous tissue: Secondary | ICD-10-CM | POA: Diagnosis not present

## 2015-12-10 DIAGNOSIS — G4730 Sleep apnea, unspecified: Secondary | ICD-10-CM | POA: Diagnosis not present

## 2015-12-10 DIAGNOSIS — Z859 Personal history of malignant neoplasm, unspecified: Secondary | ICD-10-CM | POA: Insufficient documentation

## 2015-12-10 DIAGNOSIS — Z79899 Other long term (current) drug therapy: Secondary | ICD-10-CM | POA: Insufficient documentation

## 2015-12-10 DIAGNOSIS — Z794 Long term (current) use of insulin: Secondary | ICD-10-CM | POA: Diagnosis not present

## 2015-12-10 DIAGNOSIS — R2243 Localized swelling, mass and lump, lower limb, bilateral: Secondary | ICD-10-CM | POA: Diagnosis present

## 2015-12-10 DIAGNOSIS — G2581 Restless legs syndrome: Secondary | ICD-10-CM | POA: Diagnosis not present

## 2015-12-10 DIAGNOSIS — Z8614 Personal history of Methicillin resistant Staphylococcus aureus infection: Secondary | ICD-10-CM | POA: Diagnosis not present

## 2015-12-10 DIAGNOSIS — K219 Gastro-esophageal reflux disease without esophagitis: Secondary | ICD-10-CM | POA: Insufficient documentation

## 2015-12-10 DIAGNOSIS — E119 Type 2 diabetes mellitus without complications: Secondary | ICD-10-CM | POA: Diagnosis not present

## 2015-12-10 DIAGNOSIS — G629 Polyneuropathy, unspecified: Secondary | ICD-10-CM | POA: Diagnosis not present

## 2015-12-10 DIAGNOSIS — Z7952 Long term (current) use of systemic steroids: Secondary | ICD-10-CM | POA: Insufficient documentation

## 2015-12-10 DIAGNOSIS — Z8551 Personal history of malignant neoplasm of bladder: Secondary | ICD-10-CM | POA: Diagnosis not present

## 2015-12-10 DIAGNOSIS — R609 Edema, unspecified: Secondary | ICD-10-CM | POA: Diagnosis not present

## 2015-12-10 DIAGNOSIS — I10 Essential (primary) hypertension: Secondary | ICD-10-CM | POA: Insufficient documentation

## 2015-12-10 DIAGNOSIS — F17210 Nicotine dependence, cigarettes, uncomplicated: Secondary | ICD-10-CM | POA: Insufficient documentation

## 2015-12-10 LAB — BASIC METABOLIC PANEL
Anion gap: 13 (ref 5–15)
BUN: 15 mg/dL (ref 6–20)
CALCIUM: 8.9 mg/dL (ref 8.9–10.3)
CO2: 23 mmol/L (ref 22–32)
CREATININE: 1.21 mg/dL (ref 0.61–1.24)
Chloride: 99 mmol/L — ABNORMAL LOW (ref 101–111)
Glucose, Bld: 445 mg/dL — ABNORMAL HIGH (ref 65–99)
Potassium: 4.5 mmol/L (ref 3.5–5.1)
SODIUM: 135 mmol/L (ref 135–145)

## 2015-12-10 LAB — CBC WITH DIFFERENTIAL/PLATELET
BASOS ABS: 0 10*3/uL (ref 0.0–0.1)
Basophils Relative: 0 %
EOS PCT: 3 %
Eosinophils Absolute: 0.1 10*3/uL (ref 0.0–0.7)
HEMATOCRIT: 40.3 % (ref 39.0–52.0)
Hemoglobin: 13.7 g/dL (ref 13.0–17.0)
Lymphocytes Relative: 26 %
Lymphs Abs: 1.1 10*3/uL (ref 0.7–4.0)
MCH: 33.3 pg (ref 26.0–34.0)
MCHC: 34 g/dL (ref 30.0–36.0)
MCV: 98.1 fL (ref 78.0–100.0)
Monocytes Absolute: 0.4 10*3/uL (ref 0.1–1.0)
Monocytes Relative: 10 %
Neutro Abs: 2.6 10*3/uL (ref 1.7–7.7)
Neutrophils Relative %: 61 %
Platelets: 45 10*3/uL — ABNORMAL LOW (ref 150–400)
RBC: 4.11 MIL/uL — ABNORMAL LOW (ref 4.22–5.81)
RDW: 14.5 % (ref 11.5–15.5)
Smear Review: DECREASED
WBC: 4.2 10*3/uL (ref 4.0–10.5)

## 2015-12-10 LAB — BRAIN NATRIURETIC PEPTIDE: B NATRIURETIC PEPTIDE 5: 56 pg/mL (ref 0.0–100.0)

## 2015-12-10 MED ORDER — FUROSEMIDE 40 MG PO TABS
40.0000 mg | ORAL_TABLET | Freq: Once | ORAL | Status: AC
  Administered 2015-12-10: 40 mg via ORAL
  Filled 2015-12-10: qty 1

## 2015-12-10 NOTE — Discharge Instructions (Signed)
Please read and follow all provided instructions.  Your diagnoses today include:  1. Pitting edema   2. Thrombocytopenia ([place])    Tests performed today include:  Vital signs. See below for your results today.   Medications prescribed:   None   Home care instructions:  Follow any educational materials contained in this packet. Keep a scale and weigh yourself. Keep accurate weights to see if you are retaining fluid.   Follow-up instructions: Please follow-up with your primary care provider in the next 48 hours for further evaluation of symptoms and treatment   Return instructions:   Please return to the Emergency Department if you do not get better, if you get worse, or new symptoms OR  - Fever (temperature greater than 101.57F)  - Bleeding that does not stop with holding pressure to the area    -Severe pain (please note that you may be more sore the day after your accident)  - Chest Pain  - Difficulty breathing  - Severe nausea or vomiting  - Inability to tolerate food and liquids  - Passing out  - Skin becoming red around your wounds  - Change in mental status (confusion or lethargy)  - New numbness or weakness     Please return if you have any other emergent concerns.  Additional Information:  Your vital signs today were: BP 110/67 mmHg   Pulse 69   Temp(Src) 97.5 F (36.4 C) (Oral)   Resp 20   Ht 5' 7.5" (1.715 m)   Wt 124.739 kg   BMI 42.41 kg/m2   SpO2 99% If your blood pressure (BP) was elevated above 135/85 this visit, please have this repeated by your doctor within one month. ---------------

## 2015-12-10 NOTE — ED Notes (Addendum)
Patient complaining of bilateral feet swelling x 2 days. Denies any new shortness of breath or chest pain.

## 2015-12-10 NOTE — ED Provider Notes (Signed)
CSN: [id number]     Arrival date & time 12/10/15  2021 History   First MD Initiated Contact with Patient 12/10/15 2140     Chief Complaint  Patient presents with  . Leg Swelling   (Consider location/radiation/quality/duration/timing/severity/associated sxs/prior Treatment) HPI 60 y.o. male with a hx of DM, HTN, Liver Cirrhosis, presents to the Emergency Department today complaining of bilateral ankle swelling for the past 2-3 days. Pain is 6/10 and throbbing in nature. Able to ambulate. No chest pain, shortness of breath, abdominal pain, headache, fevers. States that he forgot to take his medications this morning as he was not able to sleep last night. No other symptoms reported.   Past Medical History  Diagnosis Date  . DM (diabetes mellitus) ([place])   . Cirrhosis ([place])     bx proven steatohepatitis with cirrhosis (2010); per note from Feb 2011, received Hep A and B vaccines in 2010.  [name] HTN (hypertension)   . RLS (restless legs syndrome)   . Hyperlipidemia   . IDA (iron deficiency anemia)   . GERD (gastroesophageal reflux disease)   . Depression   . Peripheral neuropathy ([place])   . 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 B12 deficiency   . Psoriasis   . 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 Anemia due to multiple mechanisms 05/13/2011  . History of alcohol abuse 05/13/2011  . ANEMIA-IRON DEFICIENCY 03/09/2009  . S/P endoscopy May 2012    4 columns grade II esophageal varices; due for repeat in Nov 2013   . S/P colonoscopy May 2012    Tubular adenoma  . Low back pain     possible bulging disc, bone scan   . Cancer ([place])     bladder ca 05/2009 removal and  w/chemo wash  . Esophageal varices with bleeding(456.0) 11/24/11    s/p emergent EGD 11/25/11 by Dr. Hilarie Fredrickson at [facility], Grade III esophageal varices s/p banding X 5  . Small bowel obstruction ([place]) 02/15/2012    Admitted to [facility], managed by Dr. Geroge Baseman, ventral hernia manually reduced  .  Ventral hernia 02/15/12  . MRSA (methicillin resistant Staphylococcus aureus)   . Acute post-ligation esophageal ulcer with hemorrhage 04/11/2012  . Clostridium difficile infection   . H. pylori infection 12/24/2012  . Sleep apnea     [facility] took him off CPAP when had tips procedure  . Sleep apnea   . Cellulitis 04/2014    right upper leg  . Iron deficiency anemia secondary to blood loss (chronic) 03/09/2009    Secondary to GI blood loss- Rectal and esophageal varices, portal gastropathy, and esophageal ulcers    . Bladder cancer [facility])    Past Surgical History  Procedure Laterality Date  . Carpel tunnel Right   . Shoulder surgery Left   . Adrenal mass surgery  05/2009    benign, left  . Bladder surgery  01/2009 and 06/2010    cancer 2010, small recurrence in 06/2010  . Colonoscopy  04/2009    moderate int hemorrhoids, rare sigmoid diverticula, one mm sessile hyperplastic rectal polyp  . Esophagogastroduodenoscopy  03/2009    small hh  . Small bowel capsule endoscopy  03/2009    couple of small benign appearing erosions, nonbleeding  . Egd/tcs  08/2007    small hiatal hernia, pancolonic diverticula, friable anal canal, 3cm salmon colored epithelium in distal esophagus, bx negative for Barrett's  . Skin cancer excision  Oct 2012    left  arm  . Esophagogastroduodenoscopy  11/25/2011    Dr. Phineas Douglas III varices in the mid and distal esophagus, banding placed, portal astropathy  . Esophagogastroduodenoscopy  01/14/2012    Dr Rourk->4-5 columns Gr2 varices, 6 bands placed, HH, distal esophageal ulcer, portal gastropathy, antral erosions  . Esophagogastroduodenoscopy  03/17/2012    Dr. Larina Bras varices status post band ligation. Hiatal hernia. Portal gastropathy.  . Umbilical hernia repair  04/05/2012    Procedure: HERNIA REPAIR UMBILICAL ADULT;  Surgeon: Donato Heinz, MD;  Location: [facility];  Service: General;  Laterality: N/A;  . Esophagogastroduodenoscopy  04/11/2012    Dr.  Bridgett Larsson ulcer possibly at previous banding sites with stigmata     of bleeding.  Attempt at hemostasis with hemoclip and banding was not successful resulting in recurrence of bleed/  Portal gastropathy, but no evidence of gastric varices/ Recurrent esophageal varices grade 2.  . Esophagogastroduodenoscopy  05/24/2012    Dr. Larina Bras varices, portal gastropathy  . Esophagogastroduodenoscopy  04/11/12    Dr. Drue Novel in the distal esophagus adherent lot and visible vessel treated with bicap. grade I varices in the distal esophagus, one ligating band placed, portal hypertensive gastropathy in the body of the stomach.  . Colonoscopy  09/29/2012    [id number] varices. Scattered pancolonic diverticula/single cecal polyp-removed as described above  . Esophagogastroduodenoscopy  12/24/2012    Dr. Lynwood Dawley significant upper GI bleed secondary to bleeding esophageal varices. s/p hemostasis therapy with injection of a total of 64mL of 5% ethanolamine and application of 9 bands, complete egd not carried out.  . Schlerotherapy  12/24/2012    Procedure: Woodward Ku OF VARICES;  Surgeon: Daneil Dolin, MD;  [facility];  Service: Endoscopy;  Laterality: N/A;  . Esophageal banding  12/24/2012    [id number] significant upper GI bleed secondary to bleeding esophageal varices  . Tips procedure  12/2012    [facility]  . Cataract extraction, bilateral    . Hernia repair      umbilical  . Eye surgery      bilateral cataract surgery with lens implants  . Transurethral resection of bladder tumor N/A 03/12/2015    Procedure: TRANSURETHRAL RESECTION OF BLADDER TUMOR (TURBT);  Surgeon: Raynelle Bring, MD;  Location[facility];  Service: Urology;  Laterality: N/A;  . Cystoscopy w/ ureteral stent placement Bilateral 03/12/2015    Procedure: CYSTOSCOPY WITH BILATERAL RETROGRADE PYELOGRAM/RIGHT URETEROSCOPY;  Surgeon: Raynelle Bring, MD;  Location: [facility];  Service: Urology;   Laterality: Bilateral;  . Circumcision N/A 03/12/2015    Procedure: CIRCUMCISION ADULT;  Surgeon: Raynelle Bring, MD;  Location: [facility];  Service: Urology;  Laterality: N/A;   Family History  Problem Relation Age of Onset  . Cirrhosis Father     etoh  . Colon cancer Neg Hx   . Anesthesia problems Neg Hx   . Hypotension Neg Hx   . Malignant hyperthermia Neg Hx   . Pseudochol deficiency Neg Hx   . Kidney cancer Mother   . Cancer Mother   . HIV Brother   . Cirrhosis Brother     nash   Social History  Substance Use Topics  . Smoking status: Current Every Day Smoker -- 1.00 packs/day for 30 years    Types: Cigarettes  . Smokeless tobacco: Never Used  . Alcohol Use: No     Comment: drank heavily for few years in 20s    Review of Systems 10 Systems reviewed and all are negative for acute change except as noted in  the HPI.  Allergies  Aspirin; Ibuprofen; and Tylenol  Home Medications   Prior to Admission medications   Medication Sig Start Date End Date Taking? Authorizing Provider  furosemide (LASIX) 40 MG tablet TAKE TWO TABLETS BY MOUTH IN THE MORNING 11/29/15  Yes Manon Hilding Kefalas, PA-C  gabapentin (NEURONTIN) 300 MG capsule TAKE ONE CAPSULE BY MOUTH IN THE MORNING, ONE CAPSULE AT NOON AND THREE CAPSULES AT BEDTIME. 11/29/15  Yes Susy Frizzle, MD  Insulin Glargine (TOUJEO SOLOSTAR) 300 UNIT/ML SOPN Inject 40 Units into the skin daily. 09/03/15  Yes Susy Frizzle, MD  insulin regular human CONCENTRATED (HUMULIN R) 500 UNIT/ML SOLN injection Inject 60 Units into the skin 3 (three) times daily.    Yes Historical Provider, MD  lactulose (CHRONULAC) 10 GM/15ML solution Take 15CC orally up to three times a day to achieve 2 to 3 soft BMs daily. Patient taking differently: Take 10 g by mouth daily as needed for mild constipation or moderate constipation.  08/01/14  Yes Mahala Menghini, PA-C  omeprazole (PRILOSEC) 40 MG capsule Take 1 capsule (40 mg total) by mouth daily. 12/29/14  Yes Orvil Feil, NP  oxyCODONE (ROXICODONE) 5 MG immediate release tablet Take 1 tablet (5 mg total) by mouth every 4 (four) hours as needed for severe pain. 07/02/15  Yes Tanna Furry, MD  potassium chloride 20 MEQ TBCR Take 20 mEq by mouth 2 (two) times daily. 07/02/15  Yes Tanna Furry, MD  propranolol (INDERAL) 20 MG tablet TAKE ONE TABLET BY MOUTH TWICE DAILY 11/01/15  Yes Orvil Feil, NP  spironolactone (ALDACTONE) 50 MG tablet Take 50 mg by mouth daily. 03/03/14  Yes Historical Provider, MD  Suvorexant (BELSOMRA) 10 MG TABS Take 10 mg by mouth at bedtime as needed. Patient taking differently: Take 10 mg by mouth at bedtime as needed (for sleep).  10/29/15  Yes Susy Frizzle, MD  XIFAXAN 550 MG TABS tablet TAKE ONE TABLET BY MOUTH TWICE DAILY 08/29/15  Yes Carlis Stable, NP  zolpidem (AMBIEN) 5 MG tablet Take 1-2 tablets (5-10 mg total) by mouth at bedtime as needed for sleep. 08/16/15  Yes Manon Hilding Kefalas, PA-C  glucose blood (ACCU-CHEK AVIVA) test strip 1 each by Other route 3 (three) times daily. Use as instructed 05/23/14   Orlena Sheldon, PA-C  Insulin Pen Needle 29G X 12.7MM MISC Use with Toujeo solostar 09/10/15   Susy Frizzle, MD  triamcinolone cream (KENALOG) 0.1 % Apply 1 application topically 2 (two) times daily. 07/02/15   Tanna Furry, MD   BP 123/69 mmHg  Pulse 76  Temp(Src) 97.9 F (36.6 C) (Oral)  Resp 16  Ht 5' 7.5" (1.715 m)  Wt 124.739 kg  BMI 42.41 kg/m2  SpO2 98%   Physical Exam  Constitutional: He is oriented to person, place, and time. He appears well-developed and well-nourished.  HENT:  Head: Normocephalic and atraumatic.  Eyes: EOM are normal.  Cardiovascular: Normal rate, regular rhythm, S1 normal, S2 normal, normal heart sounds, intact distal pulses and normal pulses.   Pulmonary/Chest: Effort normal and breath sounds normal.  Abdominal: Soft. Normal appearance. There is no tenderness.  Musculoskeletal: Normal range of motion.  Bilateral 2+ pitting edema ankles. 1+ tibial.  No cellulitis appreciated. Mild Venous stasis   Neurological: He is alert and oriented to person, place, and time.  Skin: Skin is warm and dry.  Psychiatric: He has a normal mood and affect. His behavior is normal. Thought content normal.  Nursing note and vitals reviewed.  ED Course  Procedures (including critical care time) Labs Review Labs Reviewed  CBC WITH DIFFERENTIAL/PLATELET - Abnormal; Notable for the following:    RBC 4.11 (*)    Platelets 45 (*)    All other components within normal limits  BASIC METABOLIC PANEL - Abnormal; Notable for the following:    Chloride 99 (*)    Glucose, Bld 445 (*)    All other components within normal limits  BRAIN NATRIURETIC PEPTIDE   Imaging Review No results found. I have personally reviewed and evaluated these images and lab results as part of my medical decision-making.   EKG Interpretation None     MDM  I have reviewed relevant laboratory values.  I have reviewed the relevant previous healthcare records.I obtained HPI from historian. Patient discussed with supervising physician  ED Course:  Assessment: 27y M with a hx of DM, HTN, Liver Cirrhosis presents with bilateral ankle swelling. Pitting edema on bilateral ankle 2+ with tibial 1+. Otherwise asymptomatic. No CP, Dyspnea, abd pain. Lungs CTA. No calf tenderness. Given 40mg  of home Lasix dose. Labs showed thrombocytopenia which is chronic due to hypersplenism. Most likely edema caused by non compliance with Lasix. Will have him follow up with PCP for further management of symptoms.   Patient is in no acute distress. Vital Signs are stable. Patient is able to ambulate. Patient able to tolerate PO.        Disposition/Plan:  DC Home  Additional Verbal discharge instructions given and discussed with patient.  Pt Instructed to f/u with PCP in the next 48 hours for evaluation and treatment of symptoms. Return precautions given Pt acknowledges and agrees with plan  Supervising  Physician Noemi Chapel, MD   Final diagnoses:  Pitting edema  Thrombocytopenia [facility])        Shary Decamp, PA-C 12/10/15 [place], MD 12/12/15 [phone]

## 2015-12-11 NOTE — ED Notes (Signed)
Patient given discharge instruction, verbalized understand. IV removed, band aid applied. Patient ambulatory out of the department.  

## 2015-12-14 ENCOUNTER — Other Ambulatory Visit (HOSPITAL_COMMUNITY): Payer: Self-pay | Admitting: Emergency Medicine

## 2015-12-14 DIAGNOSIS — G4700 Insomnia, unspecified: Secondary | ICD-10-CM

## 2015-12-17 ENCOUNTER — Other Ambulatory Visit: Payer: Self-pay | Admitting: Physician Assistant

## 2015-12-17 NOTE — Telephone Encounter (Signed)
Refill appropriate and filled per protocol. 

## 2015-12-21 ENCOUNTER — Encounter (HOSPITAL_COMMUNITY): Payer: Medicaid Other | Attending: Hematology & Oncology

## 2015-12-21 DIAGNOSIS — D500 Iron deficiency anemia secondary to blood loss (chronic): Secondary | ICD-10-CM | POA: Diagnosis not present

## 2015-12-21 LAB — CBC WITH DIFFERENTIAL/PLATELET
BASOS ABS: 0 10*3/uL (ref 0.0–0.1)
Basophils Relative: 0 %
Eosinophils Absolute: 0.1 10*3/uL (ref 0.0–0.7)
Eosinophils Relative: 2 %
HCT: 40.3 % (ref 39.0–52.0)
HEMOGLOBIN: 14 g/dL (ref 13.0–17.0)
LYMPHS PCT: 26 %
Lymphs Abs: 1.1 10*3/uL (ref 0.7–4.0)
MCH: 33.7 pg (ref 26.0–34.0)
MCHC: 34.7 g/dL (ref 30.0–36.0)
MCV: 96.9 fL (ref 78.0–100.0)
MONO ABS: 0.5 10*3/uL (ref 0.1–1.0)
Monocytes Relative: 12 %
NEUTROS ABS: 2.5 10*3/uL (ref 1.7–7.7)
NEUTROS PCT: 61 %
PLATELETS: 40 10*3/uL — AB (ref 150–400)
RBC: 4.16 MIL/uL — AB (ref 4.22–5.81)
RDW: 14.2 % (ref 11.5–15.5)
SMEAR REVIEW: DECREASED
WBC: 4.1 10*3/uL (ref 4.0–10.5)

## 2015-12-21 LAB — IRON AND TIBC
IRON: 58 ug/dL (ref 45–182)
SATURATION RATIOS: 21 % (ref 17.9–39.5)
TIBC: 272 ug/dL (ref 250–450)
UIBC: 214 ug/dL

## 2015-12-21 LAB — FERRITIN: Ferritin: 63 ng/mL (ref 24–336)

## 2015-12-22 ENCOUNTER — Other Ambulatory Visit (HOSPITAL_COMMUNITY): Payer: Self-pay | Admitting: Oncology

## 2015-12-22 DIAGNOSIS — D500 Iron deficiency anemia secondary to blood loss (chronic): Secondary | ICD-10-CM

## 2015-12-22 MED ORDER — SODIUM CHLORIDE 0.9 % IV SOLN: 510.0000 mg | Freq: Once | INTRAVENOUS | Status: DC

## 2015-12-24 ENCOUNTER — Other Ambulatory Visit: Payer: Self-pay | Admitting: Gastroenterology

## 2015-12-27 ENCOUNTER — Encounter (HOSPITAL_COMMUNITY): Payer: Self-pay | Admitting: Emergency Medicine

## 2015-12-27 ENCOUNTER — Encounter: Payer: Self-pay | Admitting: Gastroenterology

## 2015-12-27 ENCOUNTER — Emergency Department (HOSPITAL_COMMUNITY): Payer: Medicaid Other

## 2015-12-27 ENCOUNTER — Other Ambulatory Visit: Payer: Self-pay

## 2015-12-27 ENCOUNTER — Emergency Department (HOSPITAL_COMMUNITY)
Admission: EM | Admit: 2015-12-27 | Discharge: 2015-12-27 | Disposition: A | Discharge status: Home / Self Care | Payer: Medicaid Other | Attending: Emergency Medicine | Admitting: Emergency Medicine

## 2015-12-27 ENCOUNTER — Other Ambulatory Visit (HOSPITAL_COMMUNITY)
Admission: RE | Admit: 2015-12-27 | Discharge: 2015-12-27 | Disposition: A | Discharge status: Home / Self Care | Payer: Medicaid Other | Source: Ambulatory Visit | Attending: Gastroenterology | Admitting: Gastroenterology

## 2015-12-27 ENCOUNTER — Ambulatory Visit (INDEPENDENT_AMBULATORY_CARE_PROVIDER_SITE_OTHER): Payer: Medicaid Other | Admitting: Gastroenterology

## 2015-12-27 ENCOUNTER — Telehealth: Payer: Self-pay | Admitting: Internal Medicine

## 2015-12-27 ENCOUNTER — Ambulatory Visit (HOSPITAL_COMMUNITY)
Admission: RE | Admit: 2015-12-27 | Discharge: 2015-12-27 | Disposition: A | Discharge status: Home / Self Care | Payer: Medicaid Other | Source: Ambulatory Visit | Attending: Gastroenterology | Admitting: Gastroenterology

## 2015-12-27 VITALS — BP 146/83 | HR 80 | Temp 97.0°F | Ht 67.0 in | Wt 293.4 lb

## 2015-12-27 DIAGNOSIS — Z9981 Dependence on supplemental oxygen: Secondary | ICD-10-CM | POA: Diagnosis not present

## 2015-12-27 DIAGNOSIS — E119 Type 2 diabetes mellitus without complications: Secondary | ICD-10-CM | POA: Diagnosis not present

## 2015-12-27 DIAGNOSIS — R0602 Shortness of breath: Secondary | ICD-10-CM | POA: Diagnosis not present

## 2015-12-27 DIAGNOSIS — Z8619 Personal history of other infectious and parasitic diseases: Secondary | ICD-10-CM | POA: Diagnosis not present

## 2015-12-27 DIAGNOSIS — Z79899 Other long term (current) drug therapy: Secondary | ICD-10-CM | POA: Insufficient documentation

## 2015-12-27 DIAGNOSIS — N289 Disorder of kidney and ureter, unspecified: Secondary | ICD-10-CM

## 2015-12-27 DIAGNOSIS — G4730 Sleep apnea, unspecified: Secondary | ICD-10-CM | POA: Insufficient documentation

## 2015-12-27 DIAGNOSIS — R635 Abnormal weight gain: Secondary | ICD-10-CM | POA: Insufficient documentation

## 2015-12-27 DIAGNOSIS — Z794 Long term (current) use of insulin: Secondary | ICD-10-CM | POA: Insufficient documentation

## 2015-12-27 DIAGNOSIS — Z862 Personal history of diseases of the blood and blood-forming organs and certain disorders involving the immune mechanism: Secondary | ICD-10-CM | POA: Insufficient documentation

## 2015-12-27 DIAGNOSIS — R188 Other ascites: Secondary | ICD-10-CM

## 2015-12-27 DIAGNOSIS — K7460 Unspecified cirrhosis of liver: Secondary | ICD-10-CM

## 2015-12-27 DIAGNOSIS — R799 Abnormal finding of blood chemistry, unspecified: Secondary | ICD-10-CM | POA: Diagnosis present

## 2015-12-27 DIAGNOSIS — G2581 Restless legs syndrome: Secondary | ICD-10-CM | POA: Insufficient documentation

## 2015-12-27 DIAGNOSIS — F329 Major depressive disorder, single episode, unspecified: Secondary | ICD-10-CM | POA: Insufficient documentation

## 2015-12-27 DIAGNOSIS — Z7952 Long term (current) use of systemic steroids: Secondary | ICD-10-CM | POA: Insufficient documentation

## 2015-12-27 DIAGNOSIS — Z8551 Personal history of malignant neoplasm of bladder: Secondary | ICD-10-CM | POA: Diagnosis not present

## 2015-12-27 DIAGNOSIS — R601 Generalized edema: Secondary | ICD-10-CM | POA: Insufficient documentation

## 2015-12-27 DIAGNOSIS — Z872 Personal history of diseases of the skin and subcutaneous tissue: Secondary | ICD-10-CM | POA: Insufficient documentation

## 2015-12-27 DIAGNOSIS — R931 Abnormal findings on diagnostic imaging of heart and coronary circulation: Secondary | ICD-10-CM | POA: Diagnosis not present

## 2015-12-27 DIAGNOSIS — K219 Gastro-esophageal reflux disease without esophagitis: Secondary | ICD-10-CM | POA: Diagnosis not present

## 2015-12-27 DIAGNOSIS — G629 Polyneuropathy, unspecified: Secondary | ICD-10-CM | POA: Insufficient documentation

## 2015-12-27 DIAGNOSIS — I10 Essential (primary) hypertension: Secondary | ICD-10-CM | POA: Insufficient documentation

## 2015-12-27 DIAGNOSIS — F17210 Nicotine dependence, cigarettes, uncomplicated: Secondary | ICD-10-CM | POA: Insufficient documentation

## 2015-12-27 DIAGNOSIS — Z8614 Personal history of Methicillin resistant Staphylococcus aureus infection: Secondary | ICD-10-CM | POA: Insufficient documentation

## 2015-12-27 LAB — CBC WITH DIFFERENTIAL/PLATELET
BASOS ABS: 0 10*3/uL (ref 0.0–0.1)
Basophils Relative: 0 %
EOS ABS: 0.1 10*3/uL (ref 0.0–0.7)
Eosinophils Relative: 2 %
HCT: 40.4 % (ref 39.0–52.0)
Hemoglobin: 13.6 g/dL (ref 13.0–17.0)
LYMPHS ABS: 1.3 10*3/uL (ref 0.7–4.0)
LYMPHS PCT: 26 %
MCH: 33.6 pg (ref 26.0–34.0)
MCHC: 33.7 g/dL (ref 30.0–36.0)
MCV: 99.8 fL (ref 78.0–100.0)
Monocytes Absolute: 0.5 10*3/uL (ref 0.1–1.0)
Monocytes Relative: 11 %
NEUTROS PCT: 61 %
Neutro Abs: 3 10*3/uL (ref 1.7–7.7)
PLATELETS: 40 10*3/uL — AB (ref 150–400)
RBC: 4.05 MIL/uL — AB (ref 4.22–5.81)
RDW: 15.2 % (ref 11.5–15.5)
SMEAR REVIEW: DECREASED
WBC: 5 10*3/uL (ref 4.0–10.5)

## 2015-12-27 LAB — COMPREHENSIVE METABOLIC PANEL
ALBUMIN: 2.7 g/dL — AB (ref 3.5–5.0)
ALK PHOS: 104 U/L (ref 38–126)
ALT: 32 U/L (ref 17–63)
ALT: 33 U/L (ref 17–63)
ANION GAP: 10 (ref 5–15)
AST: 58 U/L — ABNORMAL HIGH (ref 15–41)
AST: 58 U/L — ABNORMAL HIGH (ref 15–41)
Albumin: 2.8 g/dL — ABNORMAL LOW (ref 3.5–5.0)
Alkaline Phosphatase: 104 U/L (ref 38–126)
Anion gap: 9 (ref 5–15)
BILIRUBIN TOTAL: 4.8 mg/dL — AB (ref 0.3–1.2)
BUN: 20 mg/dL (ref 6–20)
BUN: 20 mg/dL (ref 6–20)
CHLORIDE: 104 mmol/L (ref 101–111)
CHLORIDE: 102 mmol/L (ref 101–111)
CO2: 25 mmol/L (ref 22–32)
CO2: 28 mmol/L (ref 22–32)
CREATININE: 1.63 mg/dL — AB (ref 0.61–1.24)
Calcium: 9.6 mg/dL (ref 8.9–10.3)
Calcium: 9.9 mg/dL (ref 8.9–10.3)
Creatinine, Ser: 1.53 mg/dL — ABNORMAL HIGH (ref 0.61–1.24)
GFR calc Af Amer: 56 mL/min — ABNORMAL LOW (ref >60)
GFR calc non Af Amer: 48 mL/min — ABNORMAL LOW (ref >60)
GFR, EST AFRICAN AMERICAN: 52 mL/min — AB (ref >60)
GFR, EST NON AFRICAN AMERICAN: 45 mL/min — AB (ref >60)
GLUCOSE: 177 mg/dL — AB (ref 65–99)
Glucose, Bld: 269 mg/dL — ABNORMAL HIGH (ref 65–99)
POTASSIUM: 4.1 mmol/L (ref 3.5–5.1)
POTASSIUM: 4.7 mmol/L (ref 3.5–5.1)
Sodium: 139 mmol/L (ref 135–145)
Sodium: 139 mmol/L (ref 135–145)
TOTAL PROTEIN: 7.4 g/dL (ref 6.5–8.1)
Total Bilirubin: 4.9 mg/dL — ABNORMAL HIGH (ref 0.3–1.2)
Total Protein: 7.3 g/dL (ref 6.5–8.1)

## 2015-12-27 LAB — PROTIME-INR
INR: 1.39 (ref 0.00–1.49)
INR: 1.42 (ref 0.00–1.49)
PROTHROMBIN TIME: 17.4 s — AB (ref 11.6–15.2)
Prothrombin Time: 17.2 seconds — ABNORMAL HIGH (ref 11.6–15.2)

## 2015-12-27 LAB — TROPONIN I: Troponin I: <0.03 ng/mL (ref <0.031)

## 2015-12-27 LAB — AMMONIA: Ammonia: 117 umol/L — ABNORMAL HIGH (ref 9–35)

## 2015-12-27 LAB — BRAIN NATRIURETIC PEPTIDE: B NATRIURETIC PEPTIDE 5: 126 pg/mL — AB (ref 0.0–100.0)

## 2015-12-27 LAB — APTT: APTT: 35 s (ref 24–37)

## 2015-12-27 MED ORDER — FUROSEMIDE 10 MG/ML IJ SOLN
40.0000 mg | INTRAMUSCULAR | Status: AC
  Administered 2015-12-27: 40 mg via INTRAVENOUS
  Filled 2015-12-27: qty 4

## 2015-12-27 NOTE — Progress Notes (Signed)
Quick Note:  MELD 20. Ammonia level increased. CXR with findings c/w congestive failure. Discussed with Dr. Gala Romney, Dr. Carmelia Bake), patient. No known heart disease but multiple risk factors. Needs acute evaluation to exclude cardiac etiology for edema. ______

## 2015-12-27 NOTE — ED Provider Notes (Signed)
CSN: [id number]     Arrival date & time 12/27/15  1710 History   First MD Initiated Contact with Patient 12/27/15 1833     Chief Complaint  Patient presents with  . Abnormal Lab     (Consider location/radiation/quality/duration/timing/severity/associated sxs/prior Treatment) HPI   the patient is a 60 year old male, he has a history of diabetes, cirrhosis plan, he has known ascites and peripheral edema and presented to the gastroenterology office today with worsening of the fluid retention and has had a significant 30 pound weight gain. The patient has had some shortness of breath, the physician assistant called ahead from the office to let us know that he was coming over for evaluation of potential congestive heart failure as he had some pulmonary vascular congestion seen on x-ray. The patient has complaints of increased weight gain, peripheral edema and abdominal distention, he is scheduled for a paracentesis to be performed tomorrow morning.  Sx are gradually worsening, persistent, and now moderate, he has minimal SOB - he has been making urine  Past Medical History  Diagnosis Date  . DM (diabetes mellitus) ([place])   . Cirrhosis ([place])     bx proven steatohepatitis with cirrhosis (2010); per note from Feb 2011, received Hep A and B vaccines in 2010.  [name] HTN (hypertension)   . RLS (restless legs syndrome)   . Hyperlipidemia   . IDA (iron deficiency anemia)   . GERD (gastroesophageal reflux disease)   . Depression   . Peripheral neuropathy ([place])   . Urothelial cancer ([place])     2010, paillary low-grade, h/o recurrence 2011, recurrence 02/2015  . B12 deficiency   . Psoriasis   . 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 Anemia due to multiple mechanisms 05/13/2011  . History of alcohol abuse 05/13/2011  . ANEMIA-IRON DEFICIENCY 03/09/2009  . S/P endoscopy May 2012    4 columns grade II esophageal varices; due for repeat in Nov 2013   . S/P colonoscopy May 2012    Tubular adenoma   . Low back pain     possible bulging disc, bone scan   . Cancer ([place])     bladder ca 05/2009 removal and  w/chemo wash  . Esophageal varices with bleeding(456.0) 11/24/11    s/p emergent EGD 11/25/11 by Dr. Hilarie Fredrickson at [facility], Grade III esophageal varices s/p banding X 5  . Small bowel obstruction ([place]) 02/15/2012    Admitted to [facility], managed by Dr. Geroge Baseman, ventral hernia manually reduced  . Ventral hernia 02/15/12  . MRSA (methicillin resistant Staphylococcus aureus)   . Acute post-ligation esophageal ulcer with hemorrhage 04/11/2012  . Clostridium difficile infection   . H. pylori infection 12/24/2012  . Sleep apnea     [facility] took him off CPAP when had tips procedure  . Sleep apnea   . Cellulitis 04/2014    right upper leg  . Iron deficiency anemia secondary to blood loss (chronic) 03/09/2009    Secondary to GI blood loss- Rectal and esophageal varices, portal gastropathy, and esophageal ulcers    . Bladder cancer [facility])    Past Surgical History  Procedure Laterality Date  . Carpel tunnel Right   . Shoulder surgery Left   . Adrenal mass surgery  05/2009    benign, left  . Bladder surgery  01/2009 and 06/2010    cancer 2010, small recurrence in 06/2010  . Colonoscopy  04/2009    moderate int hemorrhoids, rare sigmoid diverticula, one mm sessile hyperplastic rectal polyp  . Esophagogastroduodenoscopy  03/2009    small hh  . Small bowel capsule endoscopy  03/2009    couple of small benign appearing erosions, nonbleeding  . Egd/tcs  08/2007    small hiatal hernia, pancolonic diverticula, friable anal canal, 3cm salmon colored epithelium in distal esophagus, bx negative for Barrett's  . Skin cancer excision  Oct 2012    left arm  . Esophagogastroduodenoscopy  11/25/2011    Dr. Phineas Douglas III varices in the mid and distal esophagus, banding placed, portal astropathy  . Esophagogastroduodenoscopy  01/14/2012    Dr Rourk->4-5 columns Gr2 varices, 6 bands placed, HH, distal esophageal ulcer, portal  gastropathy, antral erosions  . Esophagogastroduodenoscopy  03/17/2012    Dr. Larina Bras varices status post band ligation. Hiatal hernia. Portal gastropathy.  . Umbilical hernia repair  04/05/2012    Procedure: HERNIA REPAIR UMBILICAL ADULT;  Surgeon: Donato Heinz, MD;  Location: [facility];  Service: General;  Laterality: N/A;  . Esophagogastroduodenoscopy  04/11/2012    Dr. Bridgett Larsson ulcer possibly at previous banding sites with stigmata     of bleeding.  Attempt at hemostasis with hemoclip and banding was not successful resulting in recurrence of bleed/  Portal gastropathy, but no evidence of gastric varices/ Recurrent esophageal varices grade 2.  . Esophagogastroduodenoscopy  05/24/2012    Dr. Larina Bras varices, portal gastropathy  . Esophagogastroduodenoscopy  04/11/12    Dr. Drue Novel in the distal esophagus adherent lot and visible vessel treated with bicap. grade I varices in the distal esophagus, one ligating band placed, portal hypertensive gastropathy in the body of the stomach.  . Colonoscopy  09/29/2012    [id number] varices. Scattered pancolonic diverticula/single cecal polyp-removed as described above  . Esophagogastroduodenoscopy  12/24/2012    Dr. Lynwood Dawley significant upper GI bleed secondary to bleeding esophageal varices. s/p hemostasis therapy with injection of a total of 81mL of 5% ethanolamine and application of 9 bands, complete egd not carried out.  . Schlerotherapy  12/24/2012    Procedure: Woodward Ku OF VARICES;  Surgeon: Daneil Dolin, MD;  Location: [facility];  Service: Endoscopy;  Laterality: N/A;  . Esophageal banding  12/24/2012    [id number] significant upper GI bleed secondary to bleeding esophageal varices  . Tips procedure  12/2012    [facility]  . Cataract extraction, bilateral    . Hernia repair      umbilical  . Eye surgery      bilateral cataract surgery with lens implants  . Transurethral resection of bladder tumor  N/A 03/12/2015    Procedure: TRANSURETHRAL RESECTION OF BLADDER TUMOR (TURBT);  Surgeon: Raynelle Bring, MD;  Location: [facility];  Service: Urology;  Laterality: N/A;  . Cystoscopy w/ ureteral stent placement Bilateral 03/12/2015    Procedure: CYSTOSCOPY WITH BILATERAL RETROGRADE PYELOGRAM/RIGHT URETEROSCOPY;  Surgeon: Raynelle Bring, MD;  Location: [facility];  Service: Urology;  Laterality: Bilateral;  . Circumcision N/A 03/12/2015    Procedure: CIRCUMCISION ADULT;  Surgeon: Raynelle Bring, MD;  Location: [facility];  Service: Urology;  Laterality: N/A;   Family History  Problem Relation Age of Onset  . Cirrhosis Father     etoh  . Colon cancer Neg Hx   . Anesthesia problems Neg Hx   . Hypotension Neg Hx   . Malignant hyperthermia Neg Hx   . Pseudochol deficiency Neg Hx   . Kidney cancer Mother   . Cancer Mother   . HIV Brother   . Cirrhosis Brother     nash   Social History  Substance Use  Topics  . Smoking status: Current Every Day Smoker -- 1.00 packs/day for 30 years    Types: Cigarettes  . Smokeless tobacco: Never Used  . Alcohol Use: No     Comment: drank heavily for few years in 20s    Review of Systems  All other systems reviewed and are negative.     Allergies  Aspirin; Ibuprofen; and Tylenol  Home Medications   Prior to Admission medications   Medication Sig Start Date End Date Taking? Authorizing Provider  furosemide (LASIX) 40 MG tablet TAKE TWO TABLETS BY MOUTH IN THE MORNING 11/29/15  Yes Manon Hilding Kefalas, PA-C  gabapentin (NEURONTIN) 300 MG capsule TAKE ONE CAPSULE BY MOUTH IN THE MORNING, ONE CAPSULE AT NOON AND THREE CAPSULES AT BEDTIME. Patient taking differently: TAKE TWO CAPSULES IN THE MORNING AND 2 CAPSULES AT BEDTIME 11/29/15  Yes Susy Frizzle, MD  Insulin Glargine (TOUJEO SOLOSTAR) 300 UNIT/ML SOPN Inject 40 Units into the skin daily. 09/03/15  Yes Susy Frizzle, MD  insulin regular human CONCENTRATED (HUMULIN R) 500 UNIT/ML SOLN injection Inject 60 Units  into the skin 3 (three) times daily.    Yes Historical Provider, MD  Insulin Syringes, Disposable, (B-D INSULIN SYRINGE 1CC) U-100 1 ML MISC Inject 60 Units into the skin 3 (three) times daily.   Yes Historical Provider, MD  lactulose (CHRONULAC) 10 GM/15ML solution Take 15CC orally up to three times a day to achieve 2 to 3 soft BMs daily. Patient taking differently: Take 10 g by mouth daily as needed for mild constipation or moderate constipation.  08/01/14  Yes Mahala Menghini, PA-C  omeprazole (PRILOSEC) 40 MG capsule TAKE ONE CAPSULE BY MOUTH ONCE DAILY 12/24/15  Yes Carlis Stable, NP  oxyCODONE (ROXICODONE) 5 MG immediate release tablet Take 1 tablet (5 mg total) by mouth every 4 (four) hours as needed for severe pain. 07/02/15  Yes Tanna Furry, MD  propranolol (INDERAL) 20 MG tablet TAKE ONE TABLET BY MOUTH TWICE DAILY 11/01/15  Yes Orvil Feil, NP  spironolactone (ALDACTONE) 50 MG tablet Take 50 mg by mouth 2 (two) times daily.  03/03/14  Yes Historical Provider, MD  triamcinolone cream (KENALOG) 0.1 % Apply 1 application topically 2 (two) times daily. 07/02/15  Yes Tanna Furry, MD  XIFAXAN 550 MG TABS tablet TAKE ONE TABLET BY MOUTH TWICE DAILY 08/29/15  Yes Carlis Stable, NP  ACCU-CHEK AVIVA PLUS test strip USE TEST STRIPS TO CHECK BLOOD SUGAR THREE TIMES DAILY AS DIRECTED 12/17/15   Orlena Sheldon, PA-C  Insulin Pen Needle 29G X 12.7MM MISC Use with Toujeo solostar 09/10/15   Susy Frizzle, MD  potassium chloride 20 MEQ TBCR Take 20 mEq by mouth 2 (two) times daily. Patient not taking: Reported on 12/27/2015 07/02/15   Tanna Furry, MD  Suvorexant (BELSOMRA) 10 MG TABS Take 10 mg by mouth at bedtime as needed. Patient taking differently: Take 10 mg by mouth at bedtime as needed (for sleep).  10/29/15   Susy Frizzle, MD  zolpidem (AMBIEN) 5 MG tablet Take 1-2 tablets (5-10 mg total) by mouth at bedtime as needed for sleep. Patient not taking: Reported on 12/27/2015 08/16/15   Manon Hilding Kefalas, PA-C   BP 122/70  mmHg  Pulse 65  Temp(Src) 98.2 F (36.8 C) (Oral)  Resp 17  Ht 5\' 7"  (1.702 m)  Wt 293 lb 9.6 oz (133.176 kg)  BMI 45.97 kg/m2  SpO2 95% Physical Exam  Constitutional: He appears well-developed  and well-nourished. No distress.  HENT:  Head: Normocephalic and atraumatic.  Mouth/Throat: Oropharynx is clear and moist. No oropharyngeal exudate.  Eyes: Conjunctivae and EOM are normal. Pupils are equal, round, and reactive to light. Right eye exhibits no discharge. Left eye exhibits no discharge. No scleral icterus.  Neck: Normal range of motion. Neck supple. No JVD present. No thyromegaly present.  Cardiovascular: Normal rate, regular rhythm, normal heart sounds and intact distal pulses.  Exam reveals no gallop and no friction rub.   No murmur heard. Pulmonary/Chest: Effort normal and breath sounds normal. No respiratory distress. He has no wheezes. He has no rales.  Abdominal: Soft. Bowel sounds are normal. He exhibits distension. He exhibits no mass. There is no tenderness.   Distended abdomen, obese, no fluid wave , no guarding, no peritoneal signs, psoriatic lesions across the abdominal wall  Musculoskeletal: Normal range of motion. He exhibits edema (  bilateral lower extremity significant edema). He exhibits no tenderness.  Lymphadenopathy:    He has no cervical adenopathy.  Neurological: He is alert. Coordination normal.  Skin: Skin is warm and dry. No rash noted. No erythema.  Psychiatric: He has a normal mood and affect. His behavior is normal.  Nursing note and vitals reviewed.   ED Course  Procedures (including critical care time) Labs Review Labs Reviewed  PROTIME-INR - Abnormal; Notable for the following:    Prothrombin Time 17.4 (*)    All other components within normal limits  BRAIN NATRIURETIC PEPTIDE - Abnormal; Notable for the following:    B Natriuretic Peptide 126.0 (*)    All other components within normal limits  COMPREHENSIVE METABOLIC PANEL - Abnormal;  Notable for the following:    Glucose, Bld 269 (*)    Creatinine, Ser 1.63 (*)    Albumin 2.8 (*)    AST 58 (*)    Total Bilirubin 4.8 (*)    GFR calc non Af Amer 45 (*)    GFR calc Af Amer 52 (*)    All other components within normal limits  CBC WITH DIFFERENTIAL/PLATELET - Abnormal; Notable for the following:    RBC 4.05 (*)    Platelets 40 (*)    All other components within normal limits  APTT  TROPONIN I    Imaging Review Dg Chest 2 View  12/27/2015  CLINICAL DATA:  Cough and congestion shortness of Breath EXAM: CHEST  2 VIEW COMPARISON:  07/02/2015 FINDINGS: Cardiac shadow is mildly enlarged. Increased vascular congestion and mild interstitial edema is noted consistent with congestive failure. No focal infiltrate or sizable effusion is seen. No bony abnormality is noted. IMPRESSION: Changes consistent with congestive failure. Electronically Signed   By: Inez Catalina M.D.   On: 12/27/2015 15:26   I have personally reviewed and evaluated these images and lab results as part of my medical decision-making.   EKG Interpretation   Date/Time:  Thursday December 27 2015 18:59:07 EST Ventricular Rate:  75 PR Interval:  152 QRS Duration: 118 QT Interval:  398 QTC Calculation: 444 R Axis:   -60 Text Interpretation:  Sinus rhythm Incomplete right bundle branch block  Low voltage, precordial leads Baseline wander in lead(s) V1 V2 since last  tracing no significant change Confirmed by Samanvitha Germany  MD, Priscille Shadduck ([id number]) on  12/27/2015 7:29:28 PM      MDM   Final diagnoses:  Ascites  Renal insufficiency     the patient was sent for evaluation of potential congestive heart failure given his findings on x-ray pre-hospital  showing possible pulmonary vascular congestion,  Do not need to obtain an x-ray as we have results and access to records here. We'll add labs to evaluate for congestive heart failure, Lasix ordered.  Labs show mild Renal Insuff, no other acute findings-  BNP < 400, pt  informed, VS remain normal - has paracentesis in AM - pt requesting d/c.  Informed of all results - given copy of results, will f/u in AM.  In addition to written d/c instructions, the pt was given verbal d/c instructions including the indications for return and expressed understanding to the instructions.    Noemi Chapel, MD 12/27/15 2159

## 2015-12-27 NOTE — ED Notes (Signed)
MD at the bedside  

## 2015-12-27 NOTE — Discharge Instructions (Signed)

## 2015-12-27 NOTE — Patient Instructions (Signed)
1. Continue lasix 80mg  daily. 2. Increase spironolactone to 100 mg daily. 3. Please have your chest xray done today and labs done at the hospital. 4. We are scheduling you to have abdominal fluid drawn off hopefully tomorrow. See separate instructions.  5. If you have worsening shortness of breath, abdominal pain, fever, worsening confusion, go to the ER. 6. Dr. Laural Golden is covering for [place] this weekend. If you need assistance after hours, call the hospital operator at [phone].

## 2015-12-27 NOTE — Progress Notes (Signed)
Late entry:  Discussed CXR finding with Dr. Gala Romney at 415pm. Changes c/w congestive failure.  Labs with mildly increased creatinine 1.53, elevated ammonia level. Given recent onset edema, CXR findings, cardiac etiology needs to be excluded. Discussed with ER physician, Dr. Sabra Heck. Patient notified at [place] to go to ER, he voiced understanding and plans to go now.

## 2015-12-27 NOTE — Telephone Encounter (Signed)
Talked with Tammy(wife) he was [facility](Dr.Paul Harkins). He told them that his lungs sounded wet. Tammy said that his belly is tight and his is having some SOB. He is able to eat but not a lot. I am going to see if I can get the notes from the doctor office visit.

## 2015-12-27 NOTE — Telephone Encounter (Signed)
Patient seen 12/27/15

## 2015-12-27 NOTE — Progress Notes (Signed)
Quick Note:  Patient aware.going to er. ______

## 2015-12-27 NOTE — Telephone Encounter (Signed)
PATIENT WIFE CALLED AND STATED THAT HER IS RETAINING A LOT OF FLUID AND NEEDS A PARA.  DID NOT KNOW IF HE COULD HAVE IT HERE OR IF HE HAD TO CALL HIS DR IN [place]  HE IS IN [facility] I30PM SLOT FOR TODAY

## 2015-12-27 NOTE — ED Notes (Signed)
Pt and wife state that pt has been retaining fluid over the past few weeks.  States that they saw the practitioner at Dr. Guerry Minors office today and had blood work and an xray done and then was called to come up here for further evaluation.  Pt denies any new complaints and is scheduled for a paracentesis tomorrow.

## 2015-12-27 NOTE — Progress Notes (Signed)
Primary Care Physician: Odette Fraction, MD  Primary Gastroenterologist:  Garfield Cornea, MD   Chief Complaint  Patient presents with  . Follow-up  . Bloated  . fluid retention    HPI: Kyle [name] is a 60 y.o. 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 Followed by [facility] transplant clinic, last seen in 07/2015. Not currently listed due to recurrent urothelial cancer. Last seen by [facility] 08/2014.  Presents for urgent OV for anasarca. Gives 3 weeks of increased edema. Weight is up 30 pounds since 10/2015. Seen by his neurosurgeon who encouraged him to see [place] today due to "wet lungs". Takes lasix 80mg  daily. Aldactone 50mg  daily. Never required LVAP previously. Complains of fatigue, SOB/DOE. Legs and abdomen tight. No abdominal pain. No constipation. Several BMs daily on lactulose. No melena, brbpr. Appetite ok. hadns shaking. Some mild confusion more often than usual. Denies chest pain.   Seen in ED 12/10/15. Labs as outlined. Recent labs for f/u IDA as well.   Wt Readings from Last 3 Encounters:  12/27/15 293 lb 6.4 oz (133.085 kg)  12/10/15 275 lb (124.739 kg)  10/29/15 269 lb (122.018 kg)     Current Outpatient Prescriptions  Medication Sig Dispense Refill  . ACCU-CHEK AVIVA PLUS test strip USE TEST STRIPS TO CHECK BLOOD SUGAR THREE TIMES DAILY AS DIRECTED 100 each 11  . furosemide (LASIX) 40 MG tablet TAKE TWO TABLETS BY MOUTH IN THE MORNING 60 tablet 1  . gabapentin (NEURONTIN) 300 MG capsule TAKE ONE CAPSULE BY MOUTH IN THE MORNING, ONE CAPSULE AT NOON AND THREE CAPSULES AT BEDTIME. 150 capsule 2  . Insulin Glargine (TOUJEO SOLOSTAR) 300 UNIT/ML SOPN Inject 40 Units into the skin daily. 3 pen 1  . Insulin Pen Needle 29G X 12.7MM MISC Use with Toujeo solostar 100 each 11  . insulin regular human CONCENTRATED (HUMULIN R) 500 UNIT/ML SOLN injection Inject 60 Units into the skin  3 (three) times daily.     [name] lactulose (CHRONULAC) 10 GM/15ML solution Take 15CC orally up to three times a day to achieve 2 to 3 soft BMs daily. (Patient taking differently: Take 10 g by mouth daily as needed for mild constipation or moderate constipation. ) 1892 mL 3  . omeprazole (PRILOSEC) 40 MG capsule TAKE ONE CAPSULE BY MOUTH ONCE DAILY 30 capsule 5  . oxyCODONE (ROXICODONE) 5 MG immediate release tablet Take 1 tablet (5 mg total) by mouth every 4 (four) hours as needed for severe pain. 8 tablet 0  . potassium chloride 20 MEQ TBCR Take 20 mEq by mouth 2 (two) times daily. 10 tablet 0  . propranolol (INDERAL) 20 MG tablet TAKE ONE TABLET BY MOUTH TWICE DAILY 60 tablet 3  . spironolactone (ALDACTONE) 50 MG tablet Take 50 mg by mouth daily.    . Suvorexant (BELSOMRA) 10 MG TABS Take 10 mg by mouth at bedtime as needed. (Patient taking differently: Take 10 mg by mouth at bedtime as needed (for sleep). ) 30 tablet 3  . triamcinolone cream (KENALOG) 0.1 % Apply 1 application topically 2 (two) times daily. 30 g 0  . XIFAXAN 550 MG TABS tablet TAKE ONE TABLET BY MOUTH TWICE DAILY 60 tablet 5  . zolpidem (AMBIEN) 5 MG tablet Take 1-2 tablets (5-10 mg total) by mouth at bedtime as needed for sleep. 60 tablet 2   No current facility-administered medications for this visit.  Allergies as of 12/27/2015 - Review Complete 12/27/2015  Allergen Reaction Noted  . Aspirin Other (See Comments) 12/23/2012  . Ibuprofen Other (See Comments) 05/13/2012  . Tylenol [acetaminophen] Other (See Comments) 07/17/2011   Past Medical History  Diagnosis Date  . DM (diabetes mellitus) ([place])   . Cirrhosis ([place])     bx proven steatohepatitis with cirrhosis (2010); per note from Feb 2011, received Hep A and B vaccines in 2010.  [name] HTN (hypertension)   . RLS (restless legs syndrome)   . Hyperlipidemia   . IDA (iron deficiency anemia)   . GERD (gastroesophageal reflux disease)   . Depression   . Peripheral  neuropathy ([place])   . Urothelial cancer ([place])     2010, paillary low-grade, h/o recurrence 2011, recurrence 02/2015  . B12 deficiency   . Psoriasis   . 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 Anemia due to multiple mechanisms 05/13/2011  . History of alcohol abuse 05/13/2011  . ANEMIA-IRON DEFICIENCY 03/09/2009  . S/P endoscopy May 2012    4 columns grade II esophageal varices; due for repeat in Nov 2013   . S/P colonoscopy May 2012    Tubular adenoma  . Low back pain     possible bulging disc, bone scan   . Cancer ([place])     bladder ca 05/2009 removal and  w/chemo wash  . Esophageal varices with bleeding(456.0) 11/24/11    s/p emergent EGD 11/25/11 by Dr. Hilarie Fredrickson at [facility], Grade III esophageal varices s/p banding X 5  . Small bowel obstruction ([place]) 02/15/2012    Admitted to [facility], managed by Dr. Geroge Baseman, ventral hernia manually reduced  . Ventral hernia 02/15/12  . MRSA (methicillin resistant Staphylococcus aureus)   . Acute post-ligation esophageal ulcer with hemorrhage 04/11/2012  . Clostridium difficile infection   . H. pylori infection 12/24/2012  . Sleep apnea     [facility] took him off CPAP when had tips procedure  . Sleep apnea   . Cellulitis 04/2014    right upper leg  . Iron deficiency anemia secondary to blood loss (chronic) 03/09/2009    Secondary to GI blood loss- Rectal and esophageal varices, portal gastropathy, and esophageal ulcers    . Bladder cancer [facility])    Past Surgical History  Procedure Laterality Date  . Carpel tunnel Right   . Shoulder surgery Left   . Adrenal mass surgery  05/2009    benign, left  . Bladder surgery  01/2009 and 06/2010    cancer 2010, small recurrence in 06/2010  . Colonoscopy  04/2009    moderate int hemorrhoids, rare sigmoid diverticula, one mm sessile hyperplastic rectal polyp  . Esophagogastroduodenoscopy  03/2009    small hh  . Small bowel capsule endoscopy  03/2009    couple of small benign appearing erosions, nonbleeding  . Egd/tcs   08/2007    small hiatal hernia, pancolonic diverticula, friable anal canal, 3cm salmon colored epithelium in distal esophagus, bx negative for Barrett's  . Skin cancer excision  Oct 2012    left arm  . Esophagogastroduodenoscopy  11/25/2011    Dr. Phineas Douglas III varices in the mid and distal esophagus, banding placed, portal astropathy  . Esophagogastroduodenoscopy  01/14/2012    Dr Rourk->4-5 columns Gr2 varices, 6 bands placed, HH, distal esophageal ulcer, portal gastropathy, antral erosions  . Esophagogastroduodenoscopy  03/17/2012    Dr. Larina Bras varices status post band ligation. Hiatal hernia. Portal gastropathy.  . Umbilical hernia repair  04/05/2012    Procedure: HERNIA  REPAIR UMBILICAL ADULT;  Surgeon: Donato Heinz, MD;  Location: [facility] ORS;  Service: General;  Laterality: N/A;  . Esophagogastroduodenoscopy  04/11/2012    Dr. Bridgett Larsson ulcer possibly at previous banding sites with stigmata     of bleeding.  Attempt at hemostasis with hemoclip and banding was not successful resulting in recurrence of bleed/  Portal gastropathy, but no evidence of gastric varices/ Recurrent esophageal varices grade 2.  . Esophagogastroduodenoscopy  05/24/2012    Dr. Larina Bras varices, portal gastropathy  . Esophagogastroduodenoscopy  04/11/12    Dr. Drue Novel in the distal esophagus adherent lot and visible vessel treated with bicap. grade I varices in the distal esophagus, one ligating band placed, portal hypertensive gastropathy in the body of the stomach.  . Colonoscopy  09/29/2012    [id number] varices. Scattered pancolonic diverticula/single cecal polyp-removed as described above  . Esophagogastroduodenoscopy  12/24/2012    Dr. Lynwood Dawley significant upper GI bleed secondary to bleeding esophageal varices. s/p hemostasis therapy with injection of a total of 15mL of 5% ethanolamine and application of 9 bands, complete egd not carried out.  . Schlerotherapy  12/24/2012     Procedure: Woodward Ku OF VARICES;  Surgeon: Daneil Dolin, MD;  Location: [facility];  Service: Endoscopy;  Laterality: N/A;  . Esophageal banding  12/24/2012    [id number] significant upper GI bleed secondary to bleeding esophageal varices  . Tips procedure  12/2012    [facility]  . Cataract extraction, bilateral    . Hernia repair      umbilical  . Eye surgery      bilateral cataract surgery with lens implants  . Transurethral resection of bladder tumor N/A 03/12/2015    Procedure: TRANSURETHRAL RESECTION OF BLADDER TUMOR (TURBT);  Surgeon: Raynelle Bring, MD;  Location: [facility];  Service: Urology;  Laterality: N/A;  . Cystoscopy w/ ureteral stent placement Bilateral 03/12/2015    Procedure: CYSTOSCOPY WITH BILATERAL RETROGRADE PYELOGRAM/RIGHT URETEROSCOPY;  Surgeon: Raynelle Bring, MD;  Location: [facility];  Service: Urology;  Laterality: Bilateral;  . Circumcision N/A 03/12/2015    Procedure: CIRCUMCISION ADULT;  Surgeon: Raynelle Bring, MD;  Location: [facility];  Service: Urology;  Laterality: N/A;   Family History  Problem Relation Age of Onset  . Cirrhosis Father     etoh  . Colon cancer Neg Hx   . Anesthesia problems Neg Hx   . Hypotension Neg Hx   . Malignant hyperthermia Neg Hx   . Pseudochol deficiency Neg Hx   . Kidney cancer Mother   . Cancer Mother   . HIV Brother   . Cirrhosis Brother     nash   Social History   Social History  . Marital Status: Married    Spouse Name: N/A  . Number of Children: 3  . Years of Education: N/A   Occupational History  . disabled    Social History Main Topics  . Smoking status: Current Every Day Smoker -- 1.00 packs/day for 30 years    Types: Cigarettes  . Smokeless tobacco: Never Used  . Alcohol Use: No     Comment: drank heavily for few years in 20s  . Drug Use: No  . Sexual Activity: Yes    Birth Control/ Protection: None   Other Topics Concern  . None   Social History Narrative    ROS:  General: Negative for  anorexia, weight loss, fever, chills,+ fatigue, +weakness. ENT: Negative for hoarseness, difficulty swallowing , nasal congestion. CV: Negative for chest pain, angina, palpitations, +  dyspnea on exertion,+ peripheral edema.  Respiratory: see hpi.  GI: See history of present illness. GU:  Negative for dysuria, hematuria, urinary incontinence, urinary frequency, nocturnal urination.  Endo: see hpi   Physical Examination:   BP 146/83 mmHg  Pulse 80  Temp(Src) 97 F (36.1 C)  Ht 5\' 7"  (1.702 m)  Wt 293 lb 6.4 oz (133.085 kg)  BMI 45.94 kg/m2  General: obese, WM appears tired. DOE with ambulation. Accompanied by wife.  Eyes: No icterus. Mouth: Oropharyngeal mucosa moist and pink , no lesions erythema or exudate. Lungs: diminished breath sounds in bases.  Heart: Regular rate and rhythm, no murmurs rubs or gallops.  Abdomen: Bowel sounds are normal, distended and tense. nontender, periumbilical hernia, moderate sized. ezcema at central abdomen. Periumbilical area with hardening of skin. , no rebound or guarding.   Extremities: 2-3+ pitting edema to knees bilaterally.o clubbing or deformities. Neuro: Alert and oriented x 4  . Mild asterixis. Skin: Warm and dry, no jaundice.   Psych: Alert and cooperative, normal mood and affect.  Labs:  Lab Results  Component Value Date   WBC 4.1 12/21/2015   HGB 14.0 12/21/2015   HCT 40.3 12/21/2015   MCV 96.9 12/21/2015   PLT 40* 12/21/2015   Lab Results  Component Value Date   IRON 58 12/21/2015   TIBC 272 12/21/2015   FERRITIN 63 12/21/2015   Lab Results  Component Value Date   CREATININE 1.21 12/10/2015   BUN 15 12/10/2015   NA 135 12/10/2015   K 4.5 12/10/2015   CL 99* 12/10/2015   CO2 23 12/10/2015   Lab Results  Component Value Date   ALT 30 08/30/2015   AST 51* 08/30/2015   ALKPHOS 144* 08/30/2015   BILITOT 4.3* 08/30/2015   Lab Results  Component Value Date   INR 1.52* 03/12/2015   INR 1.35 02/23/2015   INR 1.26  01/25/2014     Imaging Studies: No results found.   Impression/Plan:  60 y/o male with [name] cirrhosis, s/p TIPS in 2014 for variceal bleeding complicated by hepatic encephalopathy, who presents with recent onset 30 pound weight gain/anasarca. Complains of fatigue, SOB/DOE. Denies chest pain. Ascites and significant third spacing on exam. No prior history of ascites or requiring LVAP. Based on exam, he needs CXR to rule out pulmonary edema, pleural effusions. Would benefit from LVAP with fluid analysis (cell count/diff, culture, cytology, AFB smear/culture). Needs doppler to evaluate TIPS patency. Will increase aldactone to 100mg  daily. Update labs.

## 2015-12-28 ENCOUNTER — Ambulatory Visit (HOSPITAL_COMMUNITY): Payer: Medicaid Other

## 2015-12-28 ENCOUNTER — Ambulatory Visit (HOSPITAL_COMMUNITY)
Admission: RE | Admit: 2015-12-28 | Discharge: 2015-12-28 | Disposition: A | Discharge status: Home / Self Care | Payer: Medicaid Other | Source: Ambulatory Visit | Attending: Gastroenterology | Admitting: Gastroenterology

## 2015-12-28 ENCOUNTER — Ambulatory Visit (HOSPITAL_COMMUNITY): Admission: RE | Admit: 2015-12-28 | Payer: Medicaid Other | Source: Ambulatory Visit

## 2015-12-28 ENCOUNTER — Other Ambulatory Visit: Payer: Self-pay | Admitting: Gastroenterology

## 2015-12-28 DIAGNOSIS — E119 Type 2 diabetes mellitus without complications: Secondary | ICD-10-CM | POA: Insufficient documentation

## 2015-12-28 DIAGNOSIS — R188 Other ascites: Secondary | ICD-10-CM | POA: Diagnosis not present

## 2015-12-28 DIAGNOSIS — I10 Essential (primary) hypertension: Secondary | ICD-10-CM | POA: Diagnosis not present

## 2015-12-28 DIAGNOSIS — K7460 Unspecified cirrhosis of liver: Secondary | ICD-10-CM | POA: Insufficient documentation

## 2015-12-28 DIAGNOSIS — R140 Abdominal distension (gaseous): Secondary | ICD-10-CM | POA: Diagnosis not present

## 2015-12-28 NOTE — Progress Notes (Signed)
CC'ED TO PCP 

## 2015-12-30 NOTE — Progress Notes (Signed)
Quick Note:  No significant ascites. Still needs to have TIPS evaluation with U/s as planned. ______

## 2015-12-31 ENCOUNTER — Ambulatory Visit (HOSPITAL_COMMUNITY): Payer: Medicaid Other

## 2015-12-31 ENCOUNTER — Ambulatory Visit (HOSPITAL_COMMUNITY): Admission: RE | Admit: 2015-12-31 | Payer: Medicaid Other | Source: Ambulatory Visit

## 2016-01-04 ENCOUNTER — Ambulatory Visit (HOSPITAL_COMMUNITY): Payer: Medicaid Other

## 2016-01-04 ENCOUNTER — Encounter (HOSPITAL_COMMUNITY): Payer: Medicaid Other | Attending: Hematology & Oncology

## 2016-01-04 ENCOUNTER — Ambulatory Visit (HOSPITAL_COMMUNITY)
Admission: RE | Admit: 2016-01-04 | Discharge: 2016-01-04 | Disposition: A | Discharge status: Home / Self Care | Payer: Medicaid Other | Source: Ambulatory Visit | Attending: Gastroenterology | Admitting: Gastroenterology

## 2016-01-04 VITALS — BP 129/59 | HR 76 | Temp 98.0°F | Resp 24

## 2016-01-04 DIAGNOSIS — R601 Generalized edema: Secondary | ICD-10-CM

## 2016-01-04 DIAGNOSIS — R188 Other ascites: Secondary | ICD-10-CM

## 2016-01-04 DIAGNOSIS — K7460 Unspecified cirrhosis of liver: Secondary | ICD-10-CM | POA: Diagnosis not present

## 2016-01-04 DIAGNOSIS — R0602 Shortness of breath: Secondary | ICD-10-CM | POA: Diagnosis not present

## 2016-01-04 DIAGNOSIS — K8020 Calculus of gallbladder without cholecystitis without obstruction: Secondary | ICD-10-CM | POA: Diagnosis not present

## 2016-01-04 DIAGNOSIS — D500 Iron deficiency anemia secondary to blood loss (chronic): Secondary | ICD-10-CM | POA: Diagnosis present

## 2016-01-04 DIAGNOSIS — K7581 Nonalcoholic steatohepatitis (NASH): Secondary | ICD-10-CM | POA: Diagnosis not present

## 2016-01-04 MED ORDER — SODIUM CHLORIDE 0.9 % IV SOLN
510.0000 mg | Freq: Once | INTRAVENOUS | Status: AC
  Administered 2016-01-04: 510 mg via INTRAVENOUS
  Filled 2016-01-04: qty 17

## 2016-01-04 NOTE — Patient Instructions (Signed)
[facility] Discharge Instructions  RECOMMENDATIONS MADE BY THE CONSULTANT AND ANY TEST RESULTS WILL BE SENT TO YOUR REFERRING PHYSICIAN.  Feraheme 510 mg iron infusion given as ordered. Return as scheduled.  Thank you for choosing [facility] at [facility] to provide your oncology and hematology care.  To afford each patient quality time with our provider, please arrive at least 15 minutes before your scheduled appointment time.   Beginning January 23rd 2017 lab work for the Cancer Center will be done in the  Main lab at [place] on 1st floor. If you have a lab appointment with the Cancer Center please come in thru the  Main Entrance and check in at the main information desk  You need to re-schedule your appointment should you arrive 10 or more minutes late.  We strive to give you quality time with our providers, and arriving late affects you and other patients whose appointments are after yours.  Also, if you no show three or more times for appointments you may be dismissed from the clinic at the providers discretion.     Again, thank you for choosing [facility].  Our hope is that these requests will decrease the amount of time that you wait before being seen by our physicians.       _____________________________________________________________  Should you have questions after your visit to [facility], please contact our office at [phone] between the hours of 8:30 a.m. and 4:30 p.m.  Voicemails left after 4:30 p.m. will not be returned until the following business day.  For prescription refill requests, have your pharmacy contact our office.    

## 2016-01-04 NOTE — Progress Notes (Signed)
Tolerated iron infusion well. Stable on discharge home with wife via wheelchair.

## 2016-01-09 NOTE — Progress Notes (Signed)
Quick Note:  TIPS is patent. Please let patient know. ______

## 2016-01-09 NOTE — Progress Notes (Signed)
Quick Note:  U/s shows gallstones and cirrhosis. No change.  Keep ov at [facility] 02/08/16 as scheduled. ______

## 2016-01-09 NOTE — Progress Notes (Signed)
Quick Note:  Patient needs updated Met-7, and have him come by here for updated weight. How is his fluid retention? ______

## 2016-01-14 ENCOUNTER — Telehealth: Payer: Self-pay

## 2016-01-14 ENCOUNTER — Other Ambulatory Visit: Payer: Self-pay

## 2016-01-14 ENCOUNTER — Other Ambulatory Visit: Payer: Self-pay | Admitting: Gastroenterology

## 2016-01-14 DIAGNOSIS — R188 Other ascites: Principal | ICD-10-CM

## 2016-01-14 DIAGNOSIS — K7460 Unspecified cirrhosis of liver: Secondary | ICD-10-CM

## 2016-01-14 DIAGNOSIS — R079 Chest pain, unspecified: Secondary | ICD-10-CM

## 2016-01-14 LAB — BASIC METABOLIC PANEL
BUN: 15 mg/dL (ref 7–25)
CALCIUM: 8.7 mg/dL (ref 8.6–10.3)
CO2: 24 mmol/L (ref 20–31)
Chloride: 97 mmol/L — ABNORMAL LOW (ref 98–110)
Creat: 1.31 mg/dL (ref 0.70–1.33)
Glucose, Bld: 224 mg/dL — ABNORMAL HIGH (ref 65–99)
Potassium: 4.3 mmol/L (ref 3.5–5.3)
Sodium: 135 mmol/L (ref 135–146)

## 2016-01-14 NOTE — Telephone Encounter (Signed)
Pt came by the office for a weight check- he is currently 252.6lb. At his last ov, he was 293.6. He also had his blood work done today.   Routing to [facility]SL-

## 2016-01-15 ENCOUNTER — Other Ambulatory Visit: Payer: Self-pay

## 2016-01-15 NOTE — Progress Notes (Signed)
Quick Note:  Creatinine looks much better.  What diuretics and what dose is he on? Keep ov with [facility]. ______

## 2016-01-15 NOTE — Telephone Encounter (Signed)
Great!. See result note.

## 2016-01-18 LAB — HEPATIC FUNCTION PANEL
ALBUMIN: 2.7 g/dL — AB (ref 3.6–5.1)
ALT: 29 U/L (ref 9–46)
AST: 56 U/L — ABNORMAL HIGH (ref 10–35)
Alkaline Phosphatase: 110 U/L (ref 40–115)
BILIRUBIN DIRECT: 1.6 mg/dL — AB (ref <=0.2)
Indirect Bilirubin: 3.3 mg/dL — ABNORMAL HIGH (ref 0.2–1.2)
TOTAL PROTEIN: 7.1 g/dL (ref 6.1–8.1)
Total Bilirubin: 4.9 mg/dL — ABNORMAL HIGH (ref 0.2–1.2)

## 2016-01-22 NOTE — Progress Notes (Signed)
Quick Note:  LFTs stable. ______

## 2016-01-25 ENCOUNTER — Encounter: Payer: Self-pay | Admitting: Family Medicine

## 2016-01-25 LAB — HM DIABETES EYE EXAM

## 2016-02-04 ENCOUNTER — Ambulatory Visit (INDEPENDENT_AMBULATORY_CARE_PROVIDER_SITE_OTHER): Payer: Medicaid Other | Admitting: Internal Medicine

## 2016-02-04 ENCOUNTER — Encounter: Payer: Self-pay | Admitting: Internal Medicine

## 2016-02-04 VITALS — BP 132/74 | HR 67 | Ht 68.0 in | Wt 275.0 lb

## 2016-02-04 DIAGNOSIS — R0600 Dyspnea, unspecified: Secondary | ICD-10-CM | POA: Diagnosis not present

## 2016-02-04 DIAGNOSIS — R609 Edema, unspecified: Secondary | ICD-10-CM

## 2016-02-04 DIAGNOSIS — R000 Tachycardia, unspecified: Secondary | ICD-10-CM

## 2016-02-04 NOTE — Patient Instructions (Addendum)
Medication Instructions:  Your physician recommends that you continue on your current medications as directed. Please refer to the Current Medication list given to you today.   Labwork: NONE  Testing/Procedures: Your physician has requested that you have an echocardiogram. Echocardiography is a painless test that uses sound waves to create images of your heart. It provides your doctor with information about the size and shape of your heart and how well your heart's chambers and valves are working. This procedure takes approximately one hour. There are no restrictions for this procedure.    Follow-Up: Your physician recommends that you schedule a follow-up appointment in: TO BE DETERMINED BASED ON TEST RESULTS    Any Other Special Instructions Will Be Listed Below (If Applicable).     If you need a refill on your cardiac medications before your next appointment, please call your pharmacy.   

## 2016-02-04 NOTE — Progress Notes (Addendum)
Cardiology Office Note   Date:  02/04/2016   ID:  Kyle [name], DOB 22-Mar-1956, MRN [id number]  PCP:  Odette Fraction, MD  Cardiologist:   Dorris Carnes, MD   Pt referred by Dr Dennard Schaumann for eval of CHF     History of Present Illness: Kyle [name] is a 60 y.o. male with a history of cirrhosis (TIPS) , Type II DM, back pain  Pt has had DM since 2000  Sugars run around 200  Range 90 to 600.  Pt denies CP  Breathing is OK   Has chronic LE edema, sometimes better than others   Outpatient Prescriptions Prior to Visit  Medication Sig Dispense Refill  . ACCU-CHEK AVIVA PLUS test strip USE TEST STRIPS TO CHECK BLOOD SUGAR THREE TIMES DAILY AS DIRECTED 100 each 11  . furosemide (LASIX) 40 MG tablet TAKE TWO TABLETS BY MOUTH IN THE MORNING 60 tablet 1  . gabapentin (NEURONTIN) 300 MG capsule TAKE ONE CAPSULE BY MOUTH IN THE MORNING, ONE CAPSULE AT NOON AND THREE CAPSULES AT BEDTIME. (Patient taking differently: TAKE TWO CAPSULES IN THE MORNING AND 2 CAPSULES AT BEDTIME) 150 capsule 2  . Insulin Glargine (TOUJEO SOLOSTAR) 300 UNIT/ML SOPN Inject 40 Units into the skin daily. 3 pen 1  . Insulin Pen Needle 29G X 12.7MM MISC Use with Toujeo solostar 100 each 11  . insulin regular human CONCENTRATED (HUMULIN R) 500 UNIT/ML SOLN injection Inject 60 Units into the skin 3 (three) times daily.     . Insulin Syringes, Disposable, (B-D INSULIN SYRINGE 1CC) U-100 1 ML MISC Inject 60 Units into the skin 3 (three) times daily.    [name] lactulose (CHRONULAC) 10 GM/15ML solution Take 15CC orally up to three times a day to achieve 2 to 3 soft BMs daily. (Patient taking differently: Take 10 g by mouth daily as needed for mild constipation or moderate constipation. ) 1892 mL 3  . omeprazole (PRILOSEC) 40 MG capsule TAKE ONE CAPSULE BY MOUTH ONCE DAILY 30 capsule 5  . oxyCODONE (ROXICODONE) 5 MG immediate release tablet Take 1 tablet (5 mg total) by mouth every 4 (four) hours as needed for severe pain. 8 tablet 0    . potassium chloride 20 MEQ TBCR Take 20 mEq by mouth 2 (two) times daily. 10 tablet 0  . propranolol (INDERAL) 20 MG tablet TAKE ONE TABLET BY MOUTH TWICE DAILY 60 tablet 3  . spironolactone (ALDACTONE) 50 MG tablet Take 50 mg by mouth 2 (two) times daily.     . Suvorexant (BELSOMRA) 10 MG TABS Take 10 mg by mouth at bedtime as needed. (Patient taking differently: Take 10 mg by mouth at bedtime as needed (for sleep). ) 30 tablet 3  . triamcinolone cream (KENALOG) 0.1 % Apply 1 application topically 2 (two) times daily. 30 g 0  . XIFAXAN 550 MG TABS tablet TAKE ONE TABLET BY MOUTH TWICE DAILY 60 tablet 5  . zolpidem (AMBIEN) 5 MG tablet Take 1-2 tablets (5-10 mg total) by mouth at bedtime as needed for sleep. 60 tablet 2   No facility-administered medications prior to visit.     Allergies:   Aspirin; Ibuprofen; and Tylenol   Past Medical History  Diagnosis Date  . DM (diabetes mellitus) ([place])   . Cirrhosis ([place])     bx proven steatohepatitis with cirrhosis (2010); per note from Feb 2011, received Hep A and B vaccines in 2010.  [name] HTN (hypertension)   . RLS (restless legs syndrome)   .  Hyperlipidemia   . IDA (iron deficiency anemia)   . GERD (gastroesophageal reflux disease)   . Depression   . Peripheral neuropathy ([place])   . Urothelial cancer ([place])     2010, paillary low-grade, h/o recurrence 2011, recurrence 02/2015  . B12 deficiency   . Psoriasis   . 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 Anemia due to multiple mechanisms 05/13/2011  . History of alcohol abuse 05/13/2011  . ANEMIA-IRON DEFICIENCY 03/09/2009  . S/P endoscopy May 2012    4 columns grade II esophageal varices; due for repeat in Nov 2013   . S/P colonoscopy May 2012    Tubular adenoma  . Low back pain     possible bulging disc, bone scan   . Cancer ([place])     bladder ca 05/2009 removal and  w/chemo wash  . Esophageal varices with bleeding(456.0) 11/24/11    s/p emergent EGD 11/25/11 by Dr. Hilarie Fredrickson at [facility],  Grade III esophageal varices s/p banding X 5  . Small bowel obstruction ([place]) 02/15/2012    Admitted to [facility], managed by Dr. Geroge Baseman, ventral hernia manually reduced  . Ventral hernia 02/15/12  . MRSA (methicillin resistant Staphylococcus aureus)   . Acute post-ligation esophageal ulcer with hemorrhage 04/11/2012  . Clostridium difficile infection   . H. pylori infection 12/24/2012  . Sleep apnea     [facility] took him off CPAP when had tips procedure  . Sleep apnea   . Cellulitis 04/2014    right upper leg  . Iron deficiency anemia secondary to blood loss (chronic) 03/09/2009    Secondary to GI blood loss- Rectal and esophageal varices, portal gastropathy, and esophageal ulcers    . Bladder cancer [facility])     Past Surgical History  Procedure Laterality Date  . Carpel tunnel Right   . Shoulder surgery Left   . Adrenal mass surgery  05/2009    benign, left  . Bladder surgery  01/2009 and 06/2010    cancer 2010, small recurrence in 06/2010  . Colonoscopy  04/2009    moderate int hemorrhoids, rare sigmoid diverticula, one mm sessile hyperplastic rectal polyp  . Esophagogastroduodenoscopy  03/2009    small hh  . Small bowel capsule endoscopy  03/2009    couple of small benign appearing erosions, nonbleeding  . Egd/tcs  08/2007    small hiatal hernia, pancolonic diverticula, friable anal canal, 3cm salmon colored epithelium in distal esophagus, bx negative for Barrett's  . Skin cancer excision  Oct 2012    left arm  . Esophagogastroduodenoscopy  11/25/2011    Dr. Phineas Douglas III varices in the mid and distal esophagus, banding placed, portal astropathy  . Esophagogastroduodenoscopy  01/14/2012    Dr Rourk->4-5 columns Gr2 varices, 6 bands placed, HH, distal esophageal ulcer, portal gastropathy, antral erosions  . Esophagogastroduodenoscopy  03/17/2012    Dr. Larina Bras varices status post band ligation. Hiatal hernia. Portal gastropathy.  . Umbilical hernia repair  04/05/2012    Procedure: HERNIA  REPAIR UMBILICAL ADULT;  Surgeon: Donato Heinz, MD;  Location: [facility];  Service: General;  Laterality: N/A;  . Esophagogastroduodenoscopy  04/11/2012    Dr. Bridgett Larsson ulcer possibly at previous banding sites with stigmata     of bleeding.  Attempt at hemostasis with hemoclip and banding was not successful resulting in recurrence of bleed/  Portal gastropathy, but no evidence of gastric varices/ Recurrent esophageal varices grade 2.  . Esophagogastroduodenoscopy  05/24/2012    Dr. Larina Bras varices, portal gastropathy  . Esophagogastroduodenoscopy  04/11/12    Dr. Drue Novel in the distal esophagus adherent lot and visible vessel treated with bicap. grade I varices in the distal esophagus, one ligating band placed, portal hypertensive gastropathy in the body of the stomach.  . Colonoscopy  09/29/2012    [id number] varices. Scattered pancolonic diverticula/single cecal polyp-removed as described above  . Esophagogastroduodenoscopy  12/24/2012    Dr. Lynwood Dawley significant upper GI bleed secondary to bleeding esophageal varices. s/p hemostasis therapy with injection of a total of 14mL of 5% ethanolamine and application of 9 bands, complete egd not carried out.  . Schlerotherapy  12/24/2012    Procedure: Woodward Ku OF VARICES;  Surgeon: Daneil Dolin, MD;  [facility];  Service: Endoscopy;  Laterality: N/A;  . Esophageal banding  12/24/2012    [id number] significant upper GI bleed secondary to bleeding esophageal varices  . Tips procedure  12/2012    [facility]  . Cataract extraction, bilateral    . Hernia repair      umbilical  . Eye surgery      bilateral cataract surgery with lens implants  . Transurethral resection of bladder tumor N/A 03/12/2015    Procedure: TRANSURETHRAL RESECTION OF BLADDER TUMOR (TURBT);  Surgeon: Raynelle Bring, MD;  [facility];  Service: Urology;  Laterality: N/A;  . Cystoscopy w/ ureteral stent placement Bilateral 03/12/2015     Procedure: CYSTOSCOPY WITH BILATERAL RETROGRADE PYELOGRAM/RIGHT URETEROSCOPY;  Surgeon: Raynelle Bring, MD;  [facility]: [facility];  Service: Urology;  Laterality: Bilateral;  . Circumcision N/A 03/12/2015    Procedure: CIRCUMCISION ADULT;  Surgeon: Raynelle Bring, MD;  [facility];  Service: Urology;  Laterality: N/A;     Social History:  The patient  reports that he has been smoking Cigarettes.  He has a 30 pack-year smoking history. He has never used smokeless tobacco. He reports that he does not drink alcohol or use illicit drugs.   Family History:  The patient's family history includes Cancer in his mother; Cirrhosis in his brother and father; HIV in his brother; Kidney cancer in his mother. There is no history of Colon cancer, Anesthesia problems, Hypotension, Malignant hyperthermia, or Pseudochol deficiency.    ROS:  Please see the history of present illness. All other systems are reviewed and  Negative to the above problem except as noted.    PHYSICAL EXAM: VS:  BP 132/74 mmHg  Pulse 67  Ht 5\' 8"  (1.727 m)  Wt 275 lb (124.739 kg)  BMI 41.82 kg/m2  SpO2 95%  GEN: Morbidly obese 60 yo , in no acute distress HEENT: normal Neck: no JVD, carotid bruits, or masses Cardiac: RRR; no murmurs, rubs, or gallops,+ edema  Respiratory:  clear to auscultation bilaterally, normal work of breathing GI: Soft   Distended  No hepatomegaly   Ext  1-2+ edema  [place]ic skin changes  Not tense   MS: no deformity Moving all extremities   Skin: warm and dry, no rash Neuro:  Strength and sensation are intact Psych: euthymic mood, full affect   EKG:  EKG is not  ordered today. In Jan SR 75 bpm  Incomp RBBB   Lipid Panel    Component Value Date/Time   CHOL 247* 08/30/2015 1253   TRIG 251* 08/30/2015 1253   HDL 47 08/30/2015 1253   CHOLHDL 5.3* 08/30/2015 1253   VLDL 50* 08/30/2015 1253   LDLCALC 150* 08/30/2015 1253      Wt Readings from Last 3 Encounters:  02/04/16 275 lb (124.739 kg)  12/27/15 293 lb 9.6 oz (133.176 kg)  12/27/15 293 lb 6.4 oz (133.085 kg)      ASSESSMENT AND PLAN:  1.  Edema  May be related to cirrhosis  Will get echo to evaluate LV systoliv/diastolic function   Pt with increased volume on exam  I would not change meds now.  2.  Cirrhosis  Signif  Followed at [facility] as well    3.  DM  Poorly controlled  Pt has had for about 17 years  Risks for vasc dz    I will not start on statin now but prob should start  crestor  WIll review tests firsts  4.  HL  Aas noted above          Labs/ tests ordered today include: Orders Placed This Encounter  Procedures  . CBC with Differential  . Holter monitor - 24 hour  . Echocardiogram    Signed, Dorris Carnes, MD  02/04/2016 9:38 AM    [facility] [address], [place], [place]  [address] Phone: [phone]; Fax: [phone]

## 2016-02-06 ENCOUNTER — Ambulatory Visit (HOSPITAL_COMMUNITY)
Admission: RE | Admit: 2016-02-06 | Discharge: 2016-02-06 | Disposition: A | Discharge status: Home / Self Care | Payer: Medicaid Other | Source: Ambulatory Visit | Attending: Internal Medicine | Admitting: Internal Medicine

## 2016-02-06 DIAGNOSIS — E785 Hyperlipidemia, unspecified: Secondary | ICD-10-CM | POA: Diagnosis not present

## 2016-02-06 DIAGNOSIS — Z720 Tobacco use: Secondary | ICD-10-CM | POA: Insufficient documentation

## 2016-02-06 DIAGNOSIS — R609 Edema, unspecified: Secondary | ICD-10-CM | POA: Diagnosis not present

## 2016-02-06 DIAGNOSIS — K219 Gastro-esophageal reflux disease without esophagitis: Secondary | ICD-10-CM | POA: Diagnosis not present

## 2016-02-06 DIAGNOSIS — E119 Type 2 diabetes mellitus without complications: Secondary | ICD-10-CM | POA: Diagnosis not present

## 2016-02-06 DIAGNOSIS — I10 Essential (primary) hypertension: Secondary | ICD-10-CM | POA: Insufficient documentation

## 2016-02-06 DIAGNOSIS — G4733 Obstructive sleep apnea (adult) (pediatric): Secondary | ICD-10-CM | POA: Diagnosis not present

## 2016-02-07 ENCOUNTER — Telehealth: Payer: Self-pay

## 2016-02-07 DIAGNOSIS — R609 Edema, unspecified: Secondary | ICD-10-CM

## 2016-02-07 DIAGNOSIS — R600 Localized edema: Secondary | ICD-10-CM

## 2016-02-07 MED ORDER — METOLAZONE 2.5 MG PO TABS: ORAL_TABLET | ORAL | Status: DC

## 2016-02-07 NOTE — Telephone Encounter (Signed)
Lab orders placed,messaged Alphonsus Sias to scheduled 2 month fu with Dr Orpah Melter for pt to call back

## 2016-02-07 NOTE — Telephone Encounter (Signed)
-----   Message from Fay Records, MD sent at 02/06/2016 10:26 PM EDT ----- Put pt in for f/u in clinic in 2 months. See note re Samul Dada  It was not routed to anyone

## 2016-02-07 NOTE — Telephone Encounter (Signed)
Mailed lab slip to patient

## 2016-02-07 NOTE — Telephone Encounter (Signed)
Result Notes    Notes Recorded by Fay Records, MD on 02/06/2016 at 10:26 PM Put pt in for f/u in clinic in 2 months. See note re Samul Dada It was not routed to anyone ------  Notes Recorded by Fay Records, MD on 02/06/2016 at 10:25 PM Echo shows normal pumping function of the heart   It also supports increased volume (going along with edema) He is on lasix and spironolactone. Could try zaroxylyn 2.5 mg 2x per wk  Take it 30 min before lasix. Needs to watch Na intake Limit to 2 grams per day F/U BMET and BNP in 10 days    Study Result

## 2016-03-03 ENCOUNTER — Other Ambulatory Visit: Payer: Self-pay | Admitting: Nurse Practitioner

## 2016-03-03 ENCOUNTER — Other Ambulatory Visit: Payer: Self-pay | Admitting: Gastroenterology

## 2016-03-03 ENCOUNTER — Other Ambulatory Visit (HOSPITAL_COMMUNITY): Payer: Self-pay | Admitting: Oncology

## 2016-03-05 ENCOUNTER — Other Ambulatory Visit (HOSPITAL_COMMUNITY): Payer: Self-pay | Admitting: Oncology

## 2016-03-05 ENCOUNTER — Other Ambulatory Visit: Payer: Self-pay | Admitting: Internal Medicine

## 2016-03-05 LAB — BASIC METABOLIC PANEL
BUN: 52 mg/dL — AB (ref 7–25)
CHLORIDE: 89 mmol/L — AB (ref 98–110)
CO2: 28 mmol/L (ref 20–31)
Calcium: 9.3 mg/dL (ref 8.6–10.3)
Creat: 2.23 mg/dL — ABNORMAL HIGH (ref 0.70–1.33)
Glucose, Bld: 322 mg/dL — ABNORMAL HIGH (ref 65–99)
Potassium: 4.1 mmol/L (ref 3.5–5.3)
Sodium: 132 mmol/L — ABNORMAL LOW (ref 135–146)

## 2016-03-06 LAB — BRAIN NATRIURETIC PEPTIDE: Brain Natriuretic Peptide: 63.7 pg/mL (ref <100)

## 2016-03-13 ENCOUNTER — Encounter: Payer: Self-pay | Admitting: Internal Medicine

## 2016-03-18 ENCOUNTER — Ambulatory Visit (INDEPENDENT_AMBULATORY_CARE_PROVIDER_SITE_OTHER): Payer: Medicaid Other | Admitting: Family Medicine

## 2016-03-18 ENCOUNTER — Encounter: Payer: Self-pay | Admitting: Family Medicine

## 2016-03-18 VITALS — BP 102/64 | HR 84 | Temp 98.1°F | Resp 18 | Wt 255.0 lb

## 2016-03-18 DIAGNOSIS — R279 Unspecified lack of coordination: Secondary | ICD-10-CM

## 2016-03-18 DIAGNOSIS — G4710 Hypersomnia, unspecified: Secondary | ICD-10-CM

## 2016-03-18 DIAGNOSIS — R278 Other lack of coordination: Secondary | ICD-10-CM

## 2016-03-18 DIAGNOSIS — K7469 Other cirrhosis of liver: Secondary | ICD-10-CM | POA: Diagnosis not present

## 2016-03-18 LAB — PT WITH INR/FINGERSTICK
INR, fingerstick: 1.3 — ABNORMAL HIGH (ref 0.80–1.20)
PT, fingerstick: 15.9 seconds — ABNORMAL HIGH (ref 10.4–12.5)

## 2016-03-18 NOTE — Progress Notes (Signed)
Subjective:    Patient ID: Kyle [name], male    DOB: [birth year], 60 y.o.   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 He is currently on lactulose once a day 1 tablespoon. He is also taking xifaxin.  Since February, he has lost substantial weight as evidenced below: Wt Readings from Last 3 Encounters:  03/18/16 255 lb (115.667 kg)  02/04/16 275 lb (124.739 kg)  12/27/15 293 lb 9.6 oz (133.176 kg)   However this is with aggressive diuresis under the care of his gastroenterologist as well as his cardiologist. He has no pitting edema in his legs. There is no pulmonary edema on examination. However over the last several weeks he has become progressively lethargic, hypersomnolent, more confused, more forgetful. His wife also reports that he has more tremors. On examination today he demonstrates asterixis. He also has some mild scleral icterus. He is no longer taking pain medication or sleeping pills on a daily basis although he is taking gabapentin which could cause some hypersomnolence. He denies any symptoms of infection. He denies any fevers or chills nausea vomiting or diarrhea. He denies any dysuria or cough or shortness of breath. Past Surgical History  Procedure Laterality Date  . Carpel tunnel Right   . Shoulder surgery Left   . Adrenal mass surgery  05/2009    benign, left  . Bladder surgery  01/2009 and 06/2010    cancer 2010, small recurrence in 06/2010  . Colonoscopy  04/2009    moderate int hemorrhoids, rare sigmoid diverticula, one mm sessile hyperplastic rectal polyp  . Esophagogastroduodenoscopy  03/2009    small hh  . Small bowel capsule endoscopy  03/2009    couple of small benign appearing erosions, nonbleeding  . Egd/tcs  08/2007    small hiatal hernia, pancolonic diverticula, friable anal canal, 3cm salmon colored epithelium in distal esophagus, bx negative for Barrett's  . Skin cancer excision  Oct 2012    left arm  .  Esophagogastroduodenoscopy  11/25/2011    Dr. Phineas Douglas III varices in the mid and distal esophagus, banding placed, portal astropathy  . Esophagogastroduodenoscopy  01/14/2012    Dr Rourk->4-5 columns Gr2 varices, 6 bands placed, HH, distal esophageal ulcer, portal gastropathy, antral erosions  . Esophagogastroduodenoscopy  03/17/2012    Dr. Larina Bras varices status post band ligation. Hiatal hernia. Portal gastropathy.  . Umbilical hernia repair  04/05/2012    Procedure: HERNIA REPAIR UMBILICAL ADULT;  Surgeon: Donato Heinz, MD;  Location: [facility];  Service: General;  Laterality: N/A;  . Esophagogastroduodenoscopy  04/11/2012    Dr. Bridgett Larsson ulcer possibly at previous banding sites with stigmata     of bleeding.  Attempt at hemostasis with hemoclip and banding was not successful resulting in recurrence of bleed/  Portal gastropathy, but no evidence of gastric varices/ Recurrent esophageal varices grade 2.  . Esophagogastroduodenoscopy  05/24/2012    Dr. Larina Bras varices, portal gastropathy  . Esophagogastroduodenoscopy  04/11/12    Dr. Drue Novel in the distal esophagus adherent lot and visible vessel treated with bicap. grade I varices in the distal esophagus, one ligating band placed, portal hypertensive gastropathy in the body of the stomach.  . Colonoscopy  09/29/2012    [id number] varices. Scattered pancolonic diverticula/single cecal polyp-removed as described above  . Esophagogastroduodenoscopy  12/24/2012    Dr. Lynwood Dawley significant upper GI bleed secondary to bleeding esophageal varices. s/p hemostasis therapy with injection of a total of 81mL of 5% ethanolamine and application  of 9 bands, complete egd not carried out.  . Schlerotherapy  12/24/2012    Procedure: Woodward Ku OF VARICES;  Surgeon: Daneil Dolin, MD;  [facility];  Service: Endoscopy;  Laterality: N/A;  . Esophageal banding  12/24/2012    [id number] significant  upper GI bleed secondary to bleeding esophageal varices  . Tips procedure  12/2012    [facility]  . Cataract extraction, bilateral    . Hernia repair      umbilical  . Eye surgery      bilateral cataract surgery with lens implants  . Transurethral resection of bladder tumor N/A 03/12/2015    Procedure: TRANSURETHRAL RESECTION OF BLADDER TUMOR (TURBT);  Surgeon: Raynelle Bring, MD;  [facility];  Service: Urology;  Laterality: N/A;  . Cystoscopy w/ ureteral stent placement Bilateral 03/12/2015    Procedure: CYSTOSCOPY WITH BILATERAL RETROGRADE PYELOGRAM/RIGHT URETEROSCOPY;  Surgeon: Raynelle Bring, MD;  [facility];  Service: Urology;  Laterality: Bilateral;  . Circumcision N/A 03/12/2015    Procedure: CIRCUMCISION ADULT;  Surgeon: Raynelle Bring, MD;  [facility];  Service: Urology;  Laterality: N/A;   Current Outpatient Prescriptions on File Prior to Visit  Medication Sig Dispense Refill  . ACCU-CHEK AVIVA PLUS test strip USE TEST STRIPS TO CHECK BLOOD SUGAR THREE TIMES DAILY AS DIRECTED 100 each 11  . furosemide (LASIX) 40 MG tablet TAKE TWO TABLETS BY MOUTH IN THE MORNING 60 tablet 1  . gabapentin (NEURONTIN) 300 MG capsule TAKE ONE CAPSULE BY MOUTH IN THE MORNING, ONE CAPSULE AT NOON AND THREE CAPSULES AT BEDTIME. (Patient taking differently: TAKE TWO CAPSULES IN THE MORNING AND 2 CAPSULES AT BEDTIME) 150 capsule 2  . Insulin Glargine (TOUJEO SOLOSTAR) 300 UNIT/ML SOPN Inject 40 Units into the skin daily. 3 pen 1  . Insulin Pen Needle 29G X 12.7MM MISC Use with Toujeo solostar 100 each 11  . insulin regular human CONCENTRATED (HUMULIN R) 500 UNIT/ML SOLN injection Inject 60 Units into the skin 3 (three) times daily.     . Insulin Syringes, Disposable, (B-D INSULIN SYRINGE 1CC) U-100 1 ML MISC Inject 60 Units into the skin 3 (three) times daily.    [name] lactulose (CHRONULAC) 10 GM/15ML solution Take 15CC orally up to three times a day to achieve 2 to 3 soft BMs daily. (Patient taking  differently: Take 10 g by mouth daily as needed for mild constipation or moderate constipation. ) 1892 mL 3  . metolazone (ZAROXOLYN) 2.5 MG tablet Take 2 times a week Monday and Thursday, 30 minutes before lasix dose 30 tablet 3  . omeprazole (PRILOSEC) 40 MG capsule TAKE ONE CAPSULE BY MOUTH ONCE DAILY 30 capsule 5  . oxyCODONE (ROXICODONE) 5 MG immediate release tablet Take 1 tablet (5 mg total) by mouth every 4 (four) hours as needed for severe pain. 8 tablet 0  . potassium chloride 20 MEQ TBCR Take 20 mEq by mouth 2 (two) times daily. 10 tablet 0  . propranolol (INDERAL) 20 MG tablet TAKE ONE TABLET BY MOUTH TWICE DAILY 60 tablet 3  . spironolactone (ALDACTONE) 50 MG tablet Take 50 mg by mouth 2 (two) times daily.     . Suvorexant (BELSOMRA) 10 MG TABS Take 10 mg by mouth at bedtime as needed. 30 tablet 3  . triamcinolone cream (KENALOG) 0.1 % Apply 1 application topically 2 (two) times daily. 30 g 0  . XIFAXAN 550 MG TABS tablet TAKE ONE TABLET BY MOUTH TWICE DAILY 60 tablet 3  No current facility-administered medications on file prior to visit.   Allergies  Allergen Reactions  . Aspirin Other (See Comments)    Harms liver.   . Ibuprofen Other (See Comments)    Can't take per Dr Gala Romney- harms livers  . Tylenol [Acetaminophen] Other (See Comments)    Restless Legs    Social History   Social History  . Marital Status: Married    Spouse Name: N/A  . Number of Children: 3  . Years of Education: N/A   Occupational History  . disabled    Social History Main Topics  . Smoking status: Current Every Day Smoker -- 1.00 packs/day for 30 years    Types: Cigarettes  . Smokeless tobacco: Never Used  . Alcohol Use: No     Comment: drank heavily for few years in 20s  . Drug Use: No  . Sexual Activity: Yes    Birth Control/ Protection: None   Other Topics Concern  . Not on file   Social History Narrative    Past Medical History  Diagnosis Date  . DM (diabetes mellitus) ([place])     . Cirrhosis ([place])     bx proven steatohepatitis with cirrhosis (2010); per note from Feb 2011, received Hep A and B vaccines in 2010.  [name] HTN (hypertension)   . RLS (restless legs syndrome)   . Hyperlipidemia   . IDA (iron deficiency anemia)   . GERD (gastroesophageal reflux disease)   . Depression   . Peripheral neuropathy ([place])   . Urothelial cancer ([place])     2010, paillary low-grade, h/o recurrence 2011, recurrence 02/2015  . B12 deficiency   . Psoriasis   . 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 Anemia due to multiple mechanisms 05/13/2011  . History of alcohol abuse 05/13/2011  . ANEMIA-IRON DEFICIENCY 03/09/2009  . S/P endoscopy May 2012    4 columns grade II esophageal varices; due for repeat in Nov 2013   . S/P colonoscopy May 2012    Tubular adenoma  . Low back pain     possible bulging disc, bone scan   . Cancer ([place])     bladder ca 05/2009 removal and  w/chemo wash  . Esophageal varices with bleeding(456.0) 11/24/11    s/p emergent EGD 11/25/11 by Dr. Hilarie Fredrickson at [facility], Grade III esophageal varices s/p banding X 5  . Small bowel obstruction ([place]) 02/15/2012    Admitted to [facility], managed by Dr. Geroge Baseman, ventral hernia manually reduced  . Ventral hernia 02/15/12  . MRSA (methicillin resistant Staphylococcus aureus)   . Acute post-ligation esophageal ulcer with hemorrhage 04/11/2012  . Clostridium difficile infection   . H. pylori infection 12/24/2012  . Sleep apnea     [facility] took him off CPAP when had tips procedure  . Sleep apnea   . Cellulitis 04/2014    right upper leg  . Iron deficiency anemia secondary to blood loss (chronic) 03/09/2009    Secondary to GI blood loss- Rectal and esophageal varices, portal gastropathy, and esophageal ulcers    . Bladder cancer [facility])        Review of Systems  All other systems reviewed and are negative.      Objective:   Physical Exam  Eyes: Scleral icterus is present.  Neck: No JVD present. No thyromegaly present.   Cardiovascular: Normal rate, regular rhythm and normal heart sounds.   No murmur heard. Pulmonary/Chest: Effort normal and breath sounds normal. No respiratory distress. He has no wheezes. He has  no rales.  Abdominal: Soft. Bowel sounds are normal. He exhibits distension. There is no tenderness. There is no rebound and no guarding.  Musculoskeletal: He exhibits no edema.  Lymphadenopathy:    He has no cervical adenopathy.          Assessment & Plan:  Hypersomnolence - Plan: CBC with Differential/Platelet, COMPLETE METABOLIC PANEL WITH GFR, Lactic Acid, Plasma, Ammonia, Urinalysis, Routine w reflex microscopic (not at [facility])  Other cirrhosis of liver ([place]) - Plan: CBC with Differential/Platelet, COMPLETE METABOLIC PANEL WITH GFR, Lactic Acid, Plasma, Ammonia, Urinalysis, Routine w reflex microscopic (not at [facility]), PT with INR/Fingerstick  Asterixis I believe his hypersomnolence is secondary to mild chronic hepatic encephalopathy which is slowly worsening. I recommended that they have waited all sleeping medication. I recommended that they have all pain medicine. I will amend of the face discontinue gabapentin or any other medication that can cause somnolence. I recommended that they increase lactulose to 30 mg 4 times a day. I will check a CBC, CMP, serum lactic acid, urinalysis, and ammonia level. If encephalopathy worsens, he needs to go directly to ER.

## 2016-03-19 ENCOUNTER — Encounter (HOSPITAL_COMMUNITY): Payer: Self-pay | Admitting: *Deleted

## 2016-03-19 ENCOUNTER — Inpatient Hospital Stay (HOSPITAL_COMMUNITY)
Admission: EM | Admit: 2016-03-19 | Discharge: 2016-03-21 | DRG: 638 | Disposition: A | Discharge status: Home / Self Care | Payer: Medicaid Other | Attending: Internal Medicine | Admitting: Internal Medicine

## 2016-03-19 DIAGNOSIS — Z8051 Family history of malignant neoplasm of kidney: Secondary | ICD-10-CM

## 2016-03-19 DIAGNOSIS — R601 Generalized edema: Secondary | ICD-10-CM

## 2016-03-19 DIAGNOSIS — R739 Hyperglycemia, unspecified: Secondary | ICD-10-CM

## 2016-03-19 DIAGNOSIS — K7581 Nonalcoholic steatohepatitis (NASH): Secondary | ICD-10-CM | POA: Diagnosis present

## 2016-03-19 DIAGNOSIS — D649 Anemia, unspecified: Secondary | ICD-10-CM

## 2016-03-19 DIAGNOSIS — I8500 Esophageal varices without bleeding: Secondary | ICD-10-CM | POA: Diagnosis present

## 2016-03-19 DIAGNOSIS — Z830 Family history of human immunodeficiency virus [HIV] disease: Secondary | ICD-10-CM | POA: Diagnosis not present

## 2016-03-19 DIAGNOSIS — Z794 Long term (current) use of insulin: Secondary | ICD-10-CM

## 2016-03-19 DIAGNOSIS — I130 Hypertensive heart and chronic kidney disease with heart failure and stage 1 through stage 4 chronic kidney disease, or unspecified chronic kidney disease: Secondary | ICD-10-CM | POA: Diagnosis present

## 2016-03-19 DIAGNOSIS — K7469 Other cirrhosis of liver: Secondary | ICD-10-CM | POA: Diagnosis present

## 2016-03-19 DIAGNOSIS — N183 Chronic kidney disease, stage 3 unspecified: Secondary | ICD-10-CM | POA: Diagnosis present

## 2016-03-19 DIAGNOSIS — Z7682 Awaiting organ transplant status: Secondary | ICD-10-CM | POA: Diagnosis not present

## 2016-03-19 DIAGNOSIS — Z886 Allergy status to analgesic agent status: Secondary | ICD-10-CM

## 2016-03-19 DIAGNOSIS — R578 Other shock: Secondary | ICD-10-CM

## 2016-03-19 DIAGNOSIS — E860 Dehydration: Secondary | ICD-10-CM | POA: Diagnosis present

## 2016-03-19 DIAGNOSIS — E876 Hypokalemia: Secondary | ICD-10-CM | POA: Diagnosis not present

## 2016-03-19 DIAGNOSIS — Z9221 Personal history of antineoplastic chemotherapy: Secondary | ICD-10-CM

## 2016-03-19 DIAGNOSIS — F17200 Nicotine dependence, unspecified, uncomplicated: Secondary | ICD-10-CM | POA: Diagnosis present

## 2016-03-19 DIAGNOSIS — F17210 Nicotine dependence, cigarettes, uncomplicated: Secondary | ICD-10-CM | POA: Diagnosis present

## 2016-03-19 DIAGNOSIS — D500 Iron deficiency anemia secondary to blood loss (chronic): Secondary | ICD-10-CM | POA: Diagnosis present

## 2016-03-19 DIAGNOSIS — K7290 Hepatic failure, unspecified without coma: Secondary | ICD-10-CM | POA: Diagnosis present

## 2016-03-19 DIAGNOSIS — N179 Acute kidney failure, unspecified: Secondary | ICD-10-CM | POA: Diagnosis present

## 2016-03-19 DIAGNOSIS — K219 Gastro-esophageal reflux disease without esophagitis: Secondary | ICD-10-CM | POA: Diagnosis present

## 2016-03-19 DIAGNOSIS — R1033 Periumbilical pain: Secondary | ICD-10-CM

## 2016-03-19 DIAGNOSIS — E875 Hyperkalemia: Secondary | ICD-10-CM

## 2016-03-19 DIAGNOSIS — F1011 Alcohol abuse, in remission: Secondary | ICD-10-CM

## 2016-03-19 DIAGNOSIS — E1122 Type 2 diabetes mellitus with diabetic chronic kidney disease: Secondary | ICD-10-CM | POA: Diagnosis present

## 2016-03-19 DIAGNOSIS — C679 Malignant neoplasm of bladder, unspecified: Secondary | ICD-10-CM | POA: Diagnosis present

## 2016-03-19 DIAGNOSIS — G4700 Insomnia, unspecified: Secondary | ICD-10-CM

## 2016-03-19 DIAGNOSIS — R6510 Systemic inflammatory response syndrome (SIRS) of non-infectious origin without acute organ dysfunction: Secondary | ICD-10-CM

## 2016-03-19 DIAGNOSIS — K7030 Alcoholic cirrhosis of liver without ascites: Secondary | ICD-10-CM | POA: Diagnosis not present

## 2016-03-19 DIAGNOSIS — Z9119 Patient's noncompliance with other medical treatment and regimen: Secondary | ICD-10-CM

## 2016-03-19 DIAGNOSIS — R635 Abnormal weight gain: Secondary | ICD-10-CM

## 2016-03-19 DIAGNOSIS — D6959 Other secondary thrombocytopenia: Secondary | ICD-10-CM | POA: Diagnosis present

## 2016-03-19 DIAGNOSIS — N289 Disorder of kidney and ureter, unspecified: Secondary | ICD-10-CM

## 2016-03-19 DIAGNOSIS — D518 Other vitamin B12 deficiency anemias: Secondary | ICD-10-CM

## 2016-03-19 DIAGNOSIS — A048 Other specified bacterial intestinal infections: Secondary | ICD-10-CM

## 2016-03-19 DIAGNOSIS — E119 Type 2 diabetes mellitus without complications: Secondary | ICD-10-CM

## 2016-03-19 DIAGNOSIS — Z0100 Encounter for examination of eyes and vision without abnormal findings: Secondary | ICD-10-CM

## 2016-03-19 DIAGNOSIS — E1110 Type 2 diabetes mellitus with ketoacidosis without coma: Secondary | ICD-10-CM | POA: Diagnosis present

## 2016-03-19 DIAGNOSIS — N182 Chronic kidney disease, stage 2 (mild): Secondary | ICD-10-CM

## 2016-03-19 DIAGNOSIS — E785 Hyperlipidemia, unspecified: Secondary | ICD-10-CM | POA: Diagnosis present

## 2016-03-19 DIAGNOSIS — R569 Unspecified convulsions: Secondary | ICD-10-CM

## 2016-03-19 DIAGNOSIS — R3129 Other microscopic hematuria: Secondary | ICD-10-CM

## 2016-03-19 DIAGNOSIS — R509 Fever, unspecified: Secondary | ICD-10-CM

## 2016-03-19 DIAGNOSIS — D731 Hypersplenism: Secondary | ICD-10-CM | POA: Diagnosis present

## 2016-03-19 DIAGNOSIS — G2581 Restless legs syndrome: Secondary | ICD-10-CM

## 2016-03-19 DIAGNOSIS — Z85828 Personal history of other malignant neoplasm of skin: Secondary | ICD-10-CM | POA: Diagnosis not present

## 2016-03-19 DIAGNOSIS — D62 Acute posthemorrhagic anemia: Secondary | ICD-10-CM

## 2016-03-19 DIAGNOSIS — K7460 Unspecified cirrhosis of liver: Secondary | ICD-10-CM | POA: Diagnosis present

## 2016-03-19 DIAGNOSIS — K922 Gastrointestinal hemorrhage, unspecified: Secondary | ICD-10-CM

## 2016-03-19 DIAGNOSIS — K2211 Ulcer of esophagus with bleeding: Secondary | ICD-10-CM

## 2016-03-19 DIAGNOSIS — E1310 Other specified diabetes mellitus with ketoacidosis without coma: Secondary | ICD-10-CM | POA: Diagnosis present

## 2016-03-19 DIAGNOSIS — E7220 Disorder of urea cycle metabolism, unspecified: Secondary | ICD-10-CM

## 2016-03-19 DIAGNOSIS — E871 Hypo-osmolality and hyponatremia: Secondary | ICD-10-CM | POA: Diagnosis present

## 2016-03-19 DIAGNOSIS — Z8719 Personal history of other diseases of the digestive system: Secondary | ICD-10-CM

## 2016-03-19 DIAGNOSIS — I5032 Chronic diastolic (congestive) heart failure: Secondary | ICD-10-CM | POA: Diagnosis present

## 2016-03-19 DIAGNOSIS — R0602 Shortness of breath: Secondary | ICD-10-CM

## 2016-03-19 DIAGNOSIS — R9431 Abnormal electrocardiogram [ECG] [EKG]: Secondary | ICD-10-CM

## 2016-03-19 DIAGNOSIS — K644 Residual hemorrhoidal skin tags: Secondary | ICD-10-CM

## 2016-03-19 DIAGNOSIS — K7682 Hepatic encephalopathy: Secondary | ICD-10-CM | POA: Diagnosis present

## 2016-03-19 DIAGNOSIS — D6489 Other specified anemias: Secondary | ICD-10-CM

## 2016-03-19 DIAGNOSIS — R809 Proteinuria, unspecified: Secondary | ICD-10-CM

## 2016-03-19 DIAGNOSIS — R0789 Other chest pain: Secondary | ICD-10-CM

## 2016-03-19 HISTORY — DX: Type 2 diabetes mellitus with ketoacidosis without coma: E11.10

## 2016-03-19 HISTORY — DX: Chronic kidney disease, stage 3 (moderate): N18.3

## 2016-03-19 HISTORY — DX: Chronic diastolic (congestive) heart failure: I50.32

## 2016-03-19 LAB — CBC WITH DIFFERENTIAL/PLATELET
BASOS ABS: 0 {cells}/uL (ref 0–200)
BASOS PCT: 0 %
BASOS PCT: 0 %
Basophils Absolute: 0 10*3/uL (ref 0.0–0.1)
EOS PCT: 2 %
EOS PCT: 1 %
Eosinophils Absolute: 132 cells/uL (ref 15–500)
Eosinophils Absolute: 0.1 10*3/uL (ref 0.0–0.7)
HCT: 42.9 % (ref 38.5–50.0)
HEMATOCRIT: 38 % — AB (ref 39.0–52.0)
HEMOGLOBIN: 15.3 g/dL (ref 13.0–17.0)
Hemoglobin: 13.9 g/dL (ref 13.0–17.0)
LYMPHS ABS: 1452 {cells}/uL (ref 850–3900)
LYMPHS ABS: 1.7 10*3/uL (ref 0.7–4.0)
Lymphocytes Relative: 22 %
Lymphocytes Relative: 23 %
MCH: 35.5 pg — AB (ref 27.0–33.0)
MCH: 35.4 pg — AB (ref 26.0–34.0)
MCHC: 35.7 g/dL (ref 32.0–36.0)
MCHC: 36.6 g/dL — ABNORMAL HIGH (ref 30.0–36.0)
MCV: 99.5 fL (ref 80.0–100.0)
MCV: 96.7 fL (ref 78.0–100.0)
MONO ABS: 0.8 10*3/uL (ref 0.1–1.0)
MONOS PCT: 11 %
MPV: 10.2 fL (ref 7.5–12.5)
Monocytes Absolute: 726 cells/uL (ref 200–950)
Monocytes Relative: 11 %
NEUTROS ABS: 4290 {cells}/uL (ref 1500–7800)
NEUTROS ABS: 4.6 10*3/uL (ref 1.7–7.7)
Neutrophils Relative %: 65 %
Neutrophils Relative %: 65 %
PLATELETS: 57 10*3/uL — AB (ref 150–400)
Platelets: 65 10*3/uL — ABNORMAL LOW (ref 140–400)
RBC: 4.31 MIL/uL (ref 4.20–5.80)
RBC: 3.93 MIL/uL — ABNORMAL LOW (ref 4.22–5.81)
RDW: 15.6 % — ABNORMAL HIGH (ref 11.0–15.0)
RDW: 14.9 % (ref 11.5–15.5)
WBC: 6.6 10*3/uL (ref 3.8–10.8)
WBC: 7.2 10*3/uL (ref 4.0–10.5)

## 2016-03-19 LAB — URINALYSIS, ROUTINE W REFLEX MICROSCOPIC
BILIRUBIN URINE: NEGATIVE
Glucose, UA: >1000 mg/dL — AB
HGB URINE DIPSTICK: NEGATIVE
Ketones, ur: NEGATIVE mg/dL
Leukocytes, UA: NEGATIVE
NITRITE: NEGATIVE
PROTEIN: NEGATIVE mg/dL
SPECIFIC GRAVITY, URINE: 1.015 (ref 1.005–1.030)
pH: 5.5 (ref 5.0–8.0)

## 2016-03-19 LAB — GLUCOSE, CAPILLARY
GLUCOSE-CAPILLARY: 483 mg/dL — AB (ref 65–99)
GLUCOSE-CAPILLARY: 497 mg/dL — AB (ref 65–99)
Glucose-Capillary: 434 mg/dL — ABNORMAL HIGH (ref 65–99)

## 2016-03-19 LAB — MAGNESIUM: MAGNESIUM: 1.8 mg/dL (ref 1.7–2.4)

## 2016-03-19 LAB — COMPREHENSIVE METABOLIC PANEL
ALT: 35 U/L (ref 17–63)
AST: 55 U/L — ABNORMAL HIGH (ref 15–41)
Albumin: 2.7 g/dL — ABNORMAL LOW (ref 3.5–5.0)
Alkaline Phosphatase: 127 U/L — ABNORMAL HIGH (ref 38–126)
Anion gap: 17 — ABNORMAL HIGH (ref 5–15)
BILIRUBIN TOTAL: 7.4 mg/dL — AB (ref 0.3–1.2)
BUN: 65 mg/dL — AB (ref 6–20)
CHLORIDE: 83 mmol/L — AB (ref 101–111)
CO2: 27 mmol/L (ref 22–32)
CREATININE: 2.9 mg/dL — AB (ref 0.61–1.24)
Calcium: 9.6 mg/dL (ref 8.9–10.3)
GFR, EST AFRICAN AMERICAN: 26 mL/min — AB (ref >60)
GFR, EST NON AFRICAN AMERICAN: 22 mL/min — AB (ref >60)
Glucose, Bld: 568 mg/dL (ref 65–99)
POTASSIUM: 3.9 mmol/L (ref 3.5–5.1)
Sodium: 127 mmol/L — ABNORMAL LOW (ref 135–145)
TOTAL PROTEIN: 7.6 g/dL (ref 6.5–8.1)

## 2016-03-19 LAB — COMPLETE METABOLIC PANEL WITH GFR
ALBUMIN: 3 g/dL — AB (ref 3.6–5.1)
ALK PHOS: 106 U/L (ref 40–115)
ALT: 31 U/L (ref 9–46)
AST: 48 U/L — AB (ref 10–35)
BILIRUBIN TOTAL: 7.9 mg/dL — AB (ref 0.2–1.2)
BUN: 59 mg/dL — AB (ref 7–25)
CO2: 26 mmol/L (ref 20–31)
CREATININE: 3 mg/dL — AB (ref 0.70–1.33)
Calcium: 9.4 mg/dL (ref 8.6–10.3)
Chloride: 84 mmol/L — ABNORMAL LOW (ref 98–110)
GFR, EST NON AFRICAN AMERICAN: 22 mL/min — AB (ref >=60)
GFR, Est African American: 25 mL/min — ABNORMAL LOW (ref >=60)
GLUCOSE: 439 mg/dL — AB (ref 70–99)
Potassium: 3.7 mmol/L (ref 3.5–5.3)
SODIUM: 127 mmol/L — AB (ref 135–146)
Total Protein: 7.5 g/dL (ref 6.1–8.1)

## 2016-03-19 LAB — BASIC METABOLIC PANEL
ANION GAP: 16 — AB (ref 5–15)
BUN: 65 mg/dL — ABNORMAL HIGH (ref 6–20)
CALCIUM: 9.5 mg/dL (ref 8.9–10.3)
CO2: 27 mmol/L (ref 22–32)
Chloride: 83 mmol/L — ABNORMAL LOW (ref 101–111)
Creatinine, Ser: 2.98 mg/dL — ABNORMAL HIGH (ref 0.61–1.24)
GFR, EST AFRICAN AMERICAN: 25 mL/min — AB (ref >60)
GFR, EST NON AFRICAN AMERICAN: 22 mL/min — AB (ref >60)
Glucose, Bld: 567 mg/dL (ref 65–99)
Potassium: 3.8 mmol/L (ref 3.5–5.1)
Sodium: 126 mmol/L — ABNORMAL LOW (ref 135–145)

## 2016-03-19 LAB — AMMONIA: AMMONIA: 114 umol/L — AB (ref 9–35)

## 2016-03-19 LAB — URINE MICROSCOPIC-ADD ON: RBC / HPF: NONE SEEN RBC/hpf (ref 0–5)

## 2016-03-19 LAB — MRSA PCR SCREENING: MRSA by PCR: NEGATIVE

## 2016-03-19 MED ORDER — SODIUM CHLORIDE 0.9 % IV SOLN
INTRAVENOUS | Status: DC
  Administered 2016-03-19: 4.2 [IU]/h via INTRAVENOUS
  Filled 2016-03-19: qty 2.5

## 2016-03-19 MED ORDER — SODIUM CHLORIDE 0.9 % IV SOLN
INTRAVENOUS | Status: DC
  Administered 2016-03-19 (×2): via INTRAVENOUS

## 2016-03-19 MED ORDER — OXYCODONE HCL 5 MG PO TABS: 5.0000 mg | ORAL_TABLET | ORAL | Status: DC | PRN

## 2016-03-19 MED ORDER — POTASSIUM CHLORIDE CRYS ER 10 MEQ PO TBCR
20.0000 meq | EXTENDED_RELEASE_TABLET | Freq: Two times a day (BID) | ORAL | Status: DC
  Administered 2016-03-19 – 2016-03-21 (×4): 20 meq via ORAL
  Filled 2016-03-19 (×8): qty 2

## 2016-03-19 MED ORDER — SODIUM CHLORIDE 0.9 % IV BOLUS (SEPSIS)
1000.0000 mL | Freq: Once | INTRAVENOUS | Status: AC
  Administered 2016-03-19: 1000 mL via INTRAVENOUS

## 2016-03-19 MED ORDER — DEXTROSE-NACL 5-0.45 % IV SOLN
INTRAVENOUS | Status: DC
  Administered 2016-03-20: 02:00:00 via INTRAVENOUS

## 2016-03-19 MED ORDER — PROPRANOLOL HCL 20 MG PO TABS
20.0000 mg | ORAL_TABLET | Freq: Two times a day (BID) | ORAL | Status: DC
  Administered 2016-03-19 – 2016-03-21 (×4): 20 mg via ORAL
  Filled 2016-03-19 (×4): qty 1

## 2016-03-19 MED ORDER — INSULIN REGULAR HUMAN 100 UNIT/ML IJ SOLN
INTRAMUSCULAR | Status: AC
  Filled 2016-03-19: qty 2.5

## 2016-03-19 MED ORDER — SODIUM CHLORIDE 0.9 % IV SOLN
INTRAVENOUS | Status: DC
  Filled 2016-03-19: qty 2.5

## 2016-03-19 MED ORDER — OXYCODONE HCL 5 MG PO TABS: 5.0000 mg | ORAL_TABLET | Freq: Three times a day (TID) | ORAL | Status: DC | PRN

## 2016-03-19 MED ORDER — RIFAXIMIN 550 MG PO TABS
550.0000 mg | ORAL_TABLET | Freq: Two times a day (BID) | ORAL | Status: DC
  Administered 2016-03-19 – 2016-03-21 (×4): 550 mg via ORAL
  Filled 2016-03-19 (×4): qty 1

## 2016-03-19 MED ORDER — LACTULOSE 10 GM/15ML PO SOLN
15.0000 g | Freq: Three times a day (TID) | ORAL | Status: DC
  Administered 2016-03-19 – 2016-03-20 (×4): 15 g via ORAL
  Filled 2016-03-19 (×4): qty 30

## 2016-03-19 MED ORDER — NICOTINE 14 MG/24HR TD PT24
14.0000 mg | MEDICATED_PATCH | Freq: Every day | TRANSDERMAL | Status: DC
  Filled 2016-03-19 (×3): qty 1

## 2016-03-19 MED ORDER — PANTOPRAZOLE SODIUM 40 MG PO TBEC
80.0000 mg | DELAYED_RELEASE_TABLET | Freq: Every day | ORAL | Status: DC
  Administered 2016-03-19 – 2016-03-21 (×3): 80 mg via ORAL
  Filled 2016-03-19 (×3): qty 2

## 2016-03-19 NOTE — ED Provider Notes (Signed)
CSN: [id number]     Arrival date & time 03/19/16  1540 History   First MD Initiated Contact with Patient 03/19/16 1551     Chief Complaint  Patient presents with  . Abnormal Lab     (Consider location/radiation/quality/duration/timing/severity/associated sxs/prior Treatment) HPI Patient has history of cirrhosis and presents after being seen by his primary physician yesterday. Patient has had increase lethargy, speech slurring and weight loss for the past few weeks. Had labs drawn and was evaluated by his primary physician. He had his lactulose intake quadrupled. He's had a soft bowel movement this morning. Wife states he is much more awake and alert. No long has a slurry speech. His shakiness has improved. Patient states he has no pain including chest pain or abdominal pain. He's had no fever or chills. No shortness of breath. The swelling in his legs have improved significantly. He was called by his primary physician's office and told her to the emergency Department due to lab value abnormalities. Past Medical History  Diagnosis Date  . DM (diabetes mellitus) ([place])   . Cirrhosis ([place])     bx proven steatohepatitis with cirrhosis (2010); per note from Feb 2011, received Hep A and B vaccines in 2010.  [name] HTN (hypertension)   . RLS (restless legs syndrome)   . Hyperlipidemia   . IDA (iron deficiency anemia)   . GERD (gastroesophageal reflux disease)   . Depression   . Peripheral neuropathy ([place])   . Urothelial cancer ([place])     2010, paillary low-grade, h/o recurrence 2011, recurrence 02/2015  . B12 deficiency   . Psoriasis   . 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 Anemia due to multiple mechanisms 05/13/2011  . History of alcohol abuse 05/13/2011  . ANEMIA-IRON DEFICIENCY 03/09/2009  . S/P endoscopy May 2012    4 columns grade II esophageal varices; due for repeat in Nov 2013   . S/P colonoscopy May 2012    Tubular adenoma  . Low back pain     possible bulging disc, bone  scan   . Cancer ([place])     bladder ca 05/2009 removal and  w/chemo wash  . Esophageal varices with bleeding(456.0) 11/24/11    s/p emergent EGD 11/25/11 by Dr. Hilarie Fredrickson at [facility], Grade III esophageal varices s/p banding X 5  . Small bowel obstruction ([place]) 02/15/2012    Admitted to [facility], managed by Dr. Geroge Baseman, ventral hernia manually reduced  . Ventral hernia 02/15/12  . MRSA (methicillin resistant Staphylococcus aureus)   . Acute post-ligation esophageal ulcer with hemorrhage 04/11/2012  . Clostridium difficile infection   . H. pylori infection 12/24/2012  . Sleep apnea     [facility] took him off CPAP when had tips procedure  . Sleep apnea   . Cellulitis 04/2014    right upper leg  . Iron deficiency anemia secondary to blood loss (chronic) 03/09/2009    Secondary to GI blood loss- Rectal and esophageal varices, portal gastropathy, and esophageal ulcers    . Bladder cancer ([place])   . Chronic diastolic CHF (congestive heart failure) ([place]) 03/21/2016    01/2016 echo: EF 60-65%; LVH; grade 2 diastolic dysfunction.  . CKD (chronic kidney disease), stage III 03/21/2016  . DKA, type 2 ([place]) 03/19/2016   Past Surgical History  Procedure Laterality Date  . Carpel tunnel Right   . Shoulder surgery Left   . Adrenal mass surgery  05/2009    benign, left  . Bladder surgery  01/2009 and 06/2010  cancer 2010, small recurrence in 06/2010  . Colonoscopy  04/2009    moderate int hemorrhoids, rare sigmoid diverticula, one mm sessile hyperplastic rectal polyp  . Esophagogastroduodenoscopy  03/2009    small hh  . Small bowel capsule endoscopy  03/2009    couple of small benign appearing erosions, nonbleeding  . Egd/tcs  08/2007    small hiatal hernia, pancolonic diverticula, friable anal canal, 3cm salmon colored epithelium in distal esophagus, bx negative for Barrett's  . Skin cancer excision  Oct 2012    left arm  . Esophagogastroduodenoscopy  11/25/2011    Dr. Phineas Douglas III varices in the mid and distal esophagus,  banding placed, portal astropathy  . Esophagogastroduodenoscopy  01/14/2012    Dr Rourk->4-5 columns Gr2 varices, 6 bands placed, HH, distal esophageal ulcer, portal gastropathy, antral erosions  . Esophagogastroduodenoscopy  03/17/2012    Dr. Larina Bras varices status post band ligation. Hiatal hernia. Portal gastropathy.  . Umbilical hernia repair  04/05/2012    Procedure: HERNIA REPAIR UMBILICAL ADULT;  Surgeon: Donato Heinz, MD;  Location: [facility];  Service: General;  Laterality: N/A;  . Esophagogastroduodenoscopy  04/11/2012    Dr. Bridgett Larsson ulcer possibly at previous banding sites with stigmata     of bleeding.  Attempt at hemostasis with hemoclip and banding was not successful resulting in recurrence of bleed/  Portal gastropathy, but no evidence of gastric varices/ Recurrent esophageal varices grade 2.  . Esophagogastroduodenoscopy  05/24/2012    Dr. Larina Bras varices, portal gastropathy  . Esophagogastroduodenoscopy  04/11/12    Dr. Drue Novel in the distal esophagus adherent lot and visible vessel treated with bicap. grade I varices in the distal esophagus, one ligating band placed, portal hypertensive gastropathy in the body of the stomach.  . Colonoscopy  09/29/2012    [id number] varices. Scattered pancolonic diverticula/single cecal polyp-removed as described above  . Esophagogastroduodenoscopy  12/24/2012    Dr. Lynwood Dawley significant upper GI bleed secondary to bleeding esophageal varices. s/p hemostasis therapy with injection of a total of 10mL of 5% ethanolamine and application of 9 bands, complete egd not carried out.  . Schlerotherapy  12/24/2012    Procedure: Woodward Ku OF VARICES;  Surgeon: Daneil Dolin, MD;  Location: [facility];  Service: Endoscopy;  Laterality: N/A;  . Esophageal banding  12/24/2012    [id number] significant upper GI bleed secondary to bleeding esophageal varices  . Tips procedure  12/2012    [facility]  . Cataract  extraction, bilateral    . Hernia repair      umbilical  . Eye surgery      bilateral cataract surgery with lens implants  . Transurethral resection of bladder tumor N/A 03/12/2015    Procedure: TRANSURETHRAL RESECTION OF BLADDER TUMOR (TURBT);  Surgeon: Raynelle Bring, MD;  Location: [facility];  Service: Urology;  Laterality: N/A;  . Cystoscopy w/ ureteral stent placement Bilateral 03/12/2015    Procedure: CYSTOSCOPY WITH BILATERAL RETROGRADE PYELOGRAM/RIGHT URETEROSCOPY;  Surgeon: Raynelle Bring, MD;  Location: [facility];  Service: Urology;  Laterality: Bilateral;  . Circumcision N/A 03/12/2015    Procedure: CIRCUMCISION ADULT;  Surgeon: Raynelle Bring, MD;  Location: [facility];  Service: Urology;  Laterality: N/A;   Family History  Problem Relation Age of Onset  . Cirrhosis Father     etoh  . Colon cancer Neg Hx   . Anesthesia problems Neg Hx   . Hypotension Neg Hx   . Malignant hyperthermia Neg Hx   . Pseudochol deficiency Neg Hx   .  Kidney cancer Mother   . Cancer Mother   . HIV Brother   . Cirrhosis Brother     nash   Social History  Substance Use Topics  . Smoking status: Current Every Day Smoker -- 1.00 packs/day for 30 years    Types: Cigarettes  . Smokeless tobacco: Never Used  . Alcohol Use: No     Comment: drank heavily for few years in 20s    Review of Systems  Constitutional: Positive for activity change and appetite change. Negative for fever and chills.  Respiratory: Negative for cough.   Cardiovascular: Negative for chest pain, palpitations and leg swelling.  Gastrointestinal: Positive for abdominal distention. Negative for nausea, vomiting, abdominal pain, diarrhea, constipation and blood in stool.  Genitourinary: Negative for dysuria, frequency and flank pain.  Musculoskeletal: Negative for back pain and neck pain.  Skin: Negative for rash and wound.  Neurological: Positive for tremors. Negative for dizziness, weakness, light-headedness, numbness and headaches.   Psychiatric/Behavioral: Negative for confusion.  All other systems reviewed and are negative.     Allergies  Aspirin; Ibuprofen; and Tylenol  Home Medications   Prior to Admission medications   Medication Sig Start Date End Date Taking? Authorizing Provider  Insulin Glargine (TOUJEO SOLOSTAR) 300 UNIT/ML SOPN Inject 40 Units into the skin daily. 09/03/15  Yes Susy Frizzle, MD  insulin regular human CONCENTRATED (HUMULIN R) 500 UNIT/ML SOLN injection Inject 60 Units into the skin 3 (three) times daily.    Yes Historical Provider, MD  omeprazole (PRILOSEC) 40 MG capsule TAKE ONE CAPSULE BY MOUTH ONCE DAILY 12/24/15  Yes Carlis Stable, NP  oxyCODONE (ROXICODONE) 5 MG immediate release tablet Take 1 tablet (5 mg total) by mouth every 4 (four) hours as needed for severe pain. 07/02/15  Yes Tanna Furry, MD  potassium chloride 20 MEQ TBCR Take 20 mEq by mouth 2 (two) times daily. 07/02/15  Yes Tanna Furry, MD  propranolol (INDERAL) 20 MG tablet TAKE ONE TABLET BY MOUTH TWICE DAILY 03/03/16  Yes Orvil Feil, NP  spironolactone (ALDACTONE) 50 MG tablet Take 50 mg by mouth 2 (two) times daily.  03/03/14  Yes Historical Provider, MD  triamcinolone cream (KENALOG) 0.1 % Apply 1 application topically 2 (two) times daily. 07/02/15  Yes Tanna Furry, MD  XIFAXAN 550 MG TABS tablet TAKE ONE TABLET BY MOUTH TWICE DAILY 03/03/16  Yes Orvil Feil, NP  ACCU-CHEK AVIVA PLUS test strip USE TEST STRIPS TO CHECK BLOOD SUGAR THREE TIMES DAILY AS DIRECTED 12/17/15   Orlena Sheldon, PA-C  furosemide (LASIX) 40 MG tablet Dose has been decreased to 1 tablet daily. You may need to restart 2 tablets daily, but this decision will be made by your primary physician or gastroenterologist or cardiologist. 03/21/16   Rexene Alberts, MD  gabapentin (NEURONTIN) 300 MG capsule Take 1 capsule (300 mg total) by mouth at bedtime. 03/21/16   Rexene Alberts, MD  Insulin Pen Needle 29G X 12.7MM MISC Use with Toujeo solostar 09/10/15   Susy Frizzle,  MD  Insulin Syringes, Disposable, (B-D INSULIN SYRINGE 1CC) U-100 1 ML MISC Inject 60 Units into the skin 3 (three) times daily.    Historical Provider, MD  lactulose (CHRONULAC) 10 GM/15ML solution Take 45 mLs (30 g total) by mouth 3 (three) times daily. 03/21/16   Rexene Alberts, MD  metolazone (ZAROXOLYN) 2.5 MG tablet Take twice weekly only as needed for increase in leg swelling. 03/21/16   Rexene Alberts, MD   BP  94/54 mmHg  Pulse 78  Temp(Src) 98.2 F (36.8 C) (Oral)  Resp 20  Ht 5\' 7"  (1.702 m)  Wt 250 lb (113.4 kg)  BMI 39.15 kg/m2  SpO2 97% Physical Exam  Constitutional: He is oriented to person, place, and time. He appears well-developed and well-nourished. No distress.  HENT:  Head: Normocephalic and atraumatic.  Dry mucous membranes  Eyes: EOM are normal. Pupils are equal, round, and reactive to light. Scleral icterus is present.  Mild bilateral scleral icterus  Neck: Normal range of motion. Neck supple. No JVD present.  Cardiovascular: Normal rate and regular rhythm.  Exam reveals no gallop and no friction rub.   No murmur heard. Pulmonary/Chest: Effort normal and breath sounds normal. No respiratory distress. He has no wheezes. He has no rales. He exhibits no tenderness.  Abdominal: Soft. Bowel sounds are normal. He exhibits distension (mildly distended abdomen). He exhibits no mass. There is no tenderness. There is no rebound and no guarding.  Musculoskeletal: Normal range of motion. He exhibits no edema or tenderness.  No lower extremity pitting edema, asymmetry or tenderness. Distal pulses are equal and intact.  Neurological: He is alert and oriented to person, place, and time.  Patient is alert and oriented x3 with clear, goal oriented speech. Patient has 5/5 motor in all extremities. Sensation is intact to light touch. No asterixis.  Skin: Skin is warm and dry. No rash noted. No erythema.  Psychiatric: He has a normal mood and affect. His behavior is normal.  Nursing  note and vitals reviewed.   ED Course  Procedures (including critical care time) Labs Review Labs Reviewed  CBC WITH DIFFERENTIAL/PLATELET - Abnormal; Notable for the following:    RBC 3.93 (*)    HCT 38.0 (*)    MCH 35.4 (*)    MCHC 36.6 (*)    Platelets 57 (*)    All other components within normal limits  COMPREHENSIVE METABOLIC PANEL - Abnormal; Notable for the following:    Sodium 127 (*)    Chloride 83 (*)    Glucose, Bld 568 (*)    BUN 65 (*)    Creatinine, Ser 2.90 (*)    Albumin 2.7 (*)    AST 55 (*)    Alkaline Phosphatase 127 (*)    Total Bilirubin 7.4 (*)    GFR calc non Af Amer 22 (*)    GFR calc Af Amer 26 (*)    Anion gap 17 (*)    All other components within normal limits  URINALYSIS, ROUTINE W REFLEX MICROSCOPIC (NOT AT [facility]) - Abnormal; Notable for the following:    Glucose, UA >1000 (*)    All other components within normal limits  AMMONIA - Abnormal; Notable for the following:    Ammonia 114 (*)    All other components within normal limits  URINE MICROSCOPIC-ADD ON - Abnormal; Notable for the following:    Squamous Epithelial / LPF 0-5 (*)    Bacteria, UA RARE (*)    All other components within normal limits  BASIC METABOLIC PANEL - Abnormal; Notable for the following:    Sodium 126 (*)    Chloride 83 (*)    Glucose, Bld 567 (*)    BUN 65 (*)    Creatinine, Ser 2.98 (*)    GFR calc non Af Amer 22 (*)    GFR calc Af Amer 25 (*)    Anion gap 16 (*)    All other components within normal limits  BASIC  METABOLIC PANEL - Abnormal; Notable for the following:    Sodium 133 (*)    Potassium 3.1 (*)    Chloride 90 (*)    Glucose, Bld 205 (*)    BUN 62 (*)    Creatinine, Ser 2.80 (*)    GFR calc non Af Amer 23 (*)    GFR calc Af Amer 27 (*)    Anion gap 17 (*)    All other components within normal limits  HEMOGLOBIN A1C - Abnormal; Notable for the following:    Hgb A1c MFr Bld 11.0 (*)    All other components within normal limits  CBC - Abnormal;  Notable for the following:    RBC 3.86 (*)    HCT 37.1 (*)    MCH 35.2 (*)    MCHC 36.7 (*)    Platelets 55 (*)    All other components within normal limits  HEPATIC FUNCTION PANEL - Abnormal; Notable for the following:    Albumin 2.6 (*)    AST 52 (*)    Total Bilirubin 6.7 (*)    Bilirubin, Direct 2.4 (*)    Indirect Bilirubin 4.3 (*)    All other components within normal limits  GLUCOSE, CAPILLARY - Abnormal; Notable for the following:    Glucose-Capillary 483 (*)    All other components within normal limits  GLUCOSE, CAPILLARY - Abnormal; Notable for the following:    Glucose-Capillary 497 (*)    All other components within normal limits  GLUCOSE, CAPILLARY - Abnormal; Notable for the following:    Glucose-Capillary 434 (*)    All other components within normal limits  GLUCOSE, CAPILLARY - Abnormal; Notable for the following:    Glucose-Capillary 421 (*)    All other components within normal limits  GLUCOSE, CAPILLARY - Abnormal; Notable for the following:    Glucose-Capillary 295 (*)    All other components within normal limits  GLUCOSE, CAPILLARY - Abnormal; Notable for the following:    Glucose-Capillary 264 (*)    All other components within normal limits  GLUCOSE, CAPILLARY - Abnormal; Notable for the following:    Glucose-Capillary 207 (*)    All other components within normal limits  BASIC METABOLIC PANEL - Abnormal; Notable for the following:    Sodium 133 (*)    Chloride 91 (*)    Glucose, Bld 153 (*)    BUN 57 (*)    Creatinine, Ser 2.63 (*)    GFR calc non Af Amer 25 (*)    GFR calc Af Amer 29 (*)    All other components within normal limits  GLUCOSE, CAPILLARY - Abnormal; Notable for the following:    Glucose-Capillary 195 (*)    All other components within normal limits  GLUCOSE, CAPILLARY - Abnormal; Notable for the following:    Glucose-Capillary 154 (*)    All other components within normal limits  GLUCOSE, CAPILLARY - Abnormal; Notable for the  following:    Glucose-Capillary 118 (*)    All other components within normal limits  GLUCOSE, CAPILLARY - Abnormal; Notable for the following:    Glucose-Capillary 100 (*)    All other components within normal limits  GLUCOSE, CAPILLARY - Abnormal; Notable for the following:    Glucose-Capillary 119 (*)    All other components within normal limits  GLUCOSE, CAPILLARY - Abnormal; Notable for the following:    Glucose-Capillary 141 (*)    All other components within normal limits  AMMONIA - Abnormal; Notable for the following:  Ammonia 89 (*)    All other components within normal limits  GLUCOSE, CAPILLARY - Abnormal; Notable for the following:    Glucose-Capillary 181 (*)    All other components within normal limits  GLUCOSE, CAPILLARY - Abnormal; Notable for the following:    Glucose-Capillary 207 (*)    All other components within normal limits  BASIC METABOLIC PANEL - Abnormal; Notable for the following:    Sodium 131 (*)    Potassium 3.4 (*)    Chloride 92 (*)    Glucose, Bld 246 (*)    BUN 57 (*)    Creatinine, Ser 2.57 (*)    GFR calc non Af Amer 26 (*)    GFR calc Af Amer 30 (*)    All other components within normal limits  GLUCOSE, CAPILLARY - Abnormal; Notable for the following:    Glucose-Capillary 215 (*)    All other components within normal limits  GLUCOSE, CAPILLARY - Abnormal; Notable for the following:    Glucose-Capillary 202 (*)    All other components within normal limits  GLUCOSE, CAPILLARY - Abnormal; Notable for the following:    Glucose-Capillary 291 (*)    All other components within normal limits  GLUCOSE, CAPILLARY - Abnormal; Notable for the following:    Glucose-Capillary 260 (*)    All other components within normal limits  GLUCOSE, CAPILLARY - Abnormal; Notable for the following:    Glucose-Capillary 257 (*)    All other components within normal limits  COMPREHENSIVE METABOLIC PANEL - Abnormal; Notable for the following:    Sodium 133 (*)     Chloride 96 (*)    Glucose, Bld 289 (*)    BUN 48 (*)    Creatinine, Ser 2.16 (*)    Albumin 2.5 (*)    AST 54 (*)    Total Bilirubin 6.4 (*)    GFR calc non Af Amer 32 (*)    GFR calc Af Amer 37 (*)    All other components within normal limits  CBC - Abnormal; Notable for the following:    RBC 3.83 (*)    HCT 37.5 (*)    MCH 35.5 (*)    MCHC 36.3 (*)    Platelets 47 (*)    All other components within normal limits  AMMONIA - Abnormal; Notable for the following:    Ammonia 107 (*)    All other components within normal limits  GLUCOSE, CAPILLARY - Abnormal; Notable for the following:    Glucose-Capillary 344 (*)    All other components within normal limits  GLUCOSE, CAPILLARY - Abnormal; Notable for the following:    Glucose-Capillary 264 (*)    All other components within normal limits  GLUCOSE, CAPILLARY - Abnormal; Notable for the following:    Glucose-Capillary 273 (*)    All other components within normal limits  GLUCOSE, CAPILLARY - Abnormal; Notable for the following:    Glucose-Capillary 211 (*)    All other components within normal limits  GLUCOSE, CAPILLARY - Abnormal; Notable for the following:    Glucose-Capillary 274 (*)    All other components within normal limits  MRSA PCR SCREENING  MAGNESIUM    Imaging Review No results found. I have personally reviewed and evaluated these images and lab results as part of my medical decision-making.   EKG Interpretation None      MDM   Final diagnoses:  Hyperammonemia ([place])  Hyperglycemia  Renal insufficiency    Patient presents with improved symptoms after increasing  his lactulose dose. Labs drawn yesterday demonstrate hyponatremia, increased creatinine and liver function tests. Will repeat as well as obtain an ammonia level.  Elevated ammonia level. Discussed with hospitalist and will admit.  Julianne Rice, MD 03/21/16 [phone]

## 2016-03-19 NOTE — ED Notes (Signed)
CRITICAL VALUE ALERT  Critical value received:  568  Date of notification:  03/19/16  Time of notification:  [id number]  Critical value read back:Yes.    Nurse who received alert:  Jeanie Sewer  MD notified (1st page):  Dr.Yelverton  Time of first page:  45  MD notified (2nd page):  Time of second page:  Responding MD:  Dr. Lita Mains  Time MD responded:  [phone]

## 2016-03-19 NOTE — ED Notes (Signed)
EDP at bedside  

## 2016-03-19 NOTE — ED Notes (Signed)
AC notified of Insulin Drip. AC reported would bring to ICU.

## 2016-03-19 NOTE — H&P (Addendum)
TRH H&P   Patient Demographics:    Kyle [name], is a 60 y.o. male  MRN: [id number]   DOB - Dec 10, 1955  Admit Date - 03/19/2016  Outpatient Primary MD for the patient is Odette Fraction, MD  Referring MD:   Outpatient Specialists:  Dr Sydell Axon ( GI) Dr: Harrington Challenger ( cardiology)  Linna Hoff Hematology  [facility] transplant [facility]  Dr. Alinda Money (urology)   Patient coming from: home  Chief Complaint  Patient presents with  . Abnormal Lab      HPI:   60 year old male has history of nonalcoholic decompensated cirrhosis of liver (follows at [facility] and on transplant list for past 4 years), thrombocytopenia and history of upper GI bleed (following with Dr. Sydell Axon), uncontrolled type 2 diabetes mellitus on insulin, diabetic neuropathy, history of bladder cancer (status post TURBT in 2010 with recurrence followed by repeat TURBT in April 2016, iron deficiency anemia and ongoing tobacco use. He was seen by his PCP yesterday and was found to be progressively lethargic and hypersomnolent, increasingly confused and also having some tremors for the past few weeks. No history of pain medications or intake of sleep medications on a regular basis. His PCP noticed that patient had asterixis and increased lactulose dose and order blood work in the office. He was found to have elevated blood glucose in 400s and acute kidney injury with creatinine of 2.93 and instructed to come to the ED. Patient apparently has been diuresed aggressively by his cardiologist and gastroenterologist in the past few months with significant weight loss. (Has lost >40 pounds in past 2 months)  Patient denies any fevers, chills, nausea, vomiting, dizziness, chest pain, shortness of breath, orthopnea, PND, palpitations, abdominal pain, dysuria, hematuria, hematemesis or melena, leg swellings, recent travel or fall. Patient reports  that he drinks a lot of water but has had poor by mouth intake. He also takes Aleve once in a while for his sinus headache . Wife informs that his CBG usually remains between 200-300 and he is compliant with his medications. Denies recent illness or any change in medications besides the lactulose. In addition to increasing the dose of lactulose his PCP discontinued the gabapentin and oxycodone..  In the ED patient's vitals were stable. He was mentating normally. Blood work showed WBC of 7.2, hemoglobin of 13.9, platelets of 57, sodium of 127, chloride of 83 with a gap of 17. BUN and creatinine were elevated to 65 and 2.90. Blood glucose was 568. Chest x-ray was unremarkable. UA sent. LFTs showed mildly elevated AST and total bili of 7.4. (Increased from 4.92 months ago) Ammonia level was 114.  Patient given IV normal saline bolus and started on insulin drip. Hospitalist admission requested to stepdown unit.    Review of systems:    In addition to the HPI above, No Fever-chills, No Headache, No changes with Vision or hearing, No problems  swallowing food or Liquids, No Chest pain, Cough or Shortness of Breath, No Abdominal pain, No Nausea or Vommitting, Bowel movements are regular, No Blood in stool or Urine, No dysuria, No new skin rashes or bruises, No new joints pains-aches,  Weakness with increased somnolence No recent weight gain or loss, No polyuria, polydypsia or polyphagia, No significant Mental Stressors.  A full 10 point Review of Systems was done, except as stated above, all other Review of Systems were negative.   With Past History of the following :    Past Medical History  Diagnosis Date  . DM (diabetes mellitus) ([place])   . Cirrhosis ([place])     bx proven steatohepatitis with cirrhosis (2010); per note from Feb 2011, received Hep A and B vaccines in 2010.  [name] HTN (hypertension)   . RLS (restless legs syndrome)   . Hyperlipidemia   . IDA (iron deficiency anemia)   .  GERD (gastroesophageal reflux disease)   . Depression   . Peripheral neuropathy ([place])   . Urothelial cancer ([place])     2010, paillary low-grade, h/o recurrence 2011, recurrence 02/2015  . B12 deficiency   . Psoriasis   . 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 Anemia due to multiple mechanisms 05/13/2011  . History of alcohol abuse 05/13/2011  . ANEMIA-IRON DEFICIENCY 03/09/2009  . S/P endoscopy May 2012    4 columns grade II esophageal varices; due for repeat in Nov 2013   . S/P colonoscopy May 2012    Tubular adenoma  . Low back pain     possible bulging disc, bone scan   . Cancer ([place])     bladder ca 05/2009 removal and  w/chemo wash  . Esophageal varices with bleeding(456.0) 11/24/11    s/p emergent EGD 11/25/11 by Dr. Hilarie Fredrickson at [facility] Grade III esophageal varices s/p banding X 5  . Small bowel obstruction ([place]) 02/15/2012    Admitted to [facility], managed by Dr. Geroge Baseman, ventral hernia manually reduced  . Ventral hernia 02/15/12  . MRSA (methicillin resistant Staphylococcus aureus)   . Acute post-ligation esophageal ulcer with hemorrhage 04/11/2012  . Clostridium difficile infection   . H. pylori infection 12/24/2012  . Sleep apnea     [facility] took him off CPAP when had tips procedure  . Sleep apnea   . Cellulitis 04/2014    right upper leg  . Iron deficiency anemia secondary to blood loss (chronic) 03/09/2009    Secondary to GI blood loss- Rectal and esophageal varices, portal gastropathy, and esophageal ulcers    . Bladder cancer [facility])       Past Surgical History  Procedure Laterality Date  . Carpel tunnel Right   . Shoulder surgery Left   . Adrenal mass surgery  05/2009    benign, left  . Bladder surgery  01/2009 and 06/2010    cancer 2010, small recurrence in 06/2010  . Colonoscopy  04/2009    moderate int hemorrhoids, rare sigmoid diverticula, one mm sessile hyperplastic rectal polyp  . Esophagogastroduodenoscopy  03/2009    small hh  . Small bowel capsule endoscopy  03/2009     couple of small benign appearing erosions, nonbleeding  . Egd/tcs  08/2007    small hiatal hernia, pancolonic diverticula, friable anal canal, 3cm salmon colored epithelium in distal esophagus, bx negative for Barrett's  . Skin cancer excision  Oct 2012    left arm  . Esophagogastroduodenoscopy  11/25/2011    Dr. Phineas Douglas III varices in the mid and  distal esophagus, banding placed, portal astropathy  . Esophagogastroduodenoscopy  01/14/2012    Dr Rourk->4-5 columns Gr2 varices, 6 bands placed, HH, distal esophageal ulcer, portal gastropathy, antral erosions  . Esophagogastroduodenoscopy  03/17/2012    Dr. Larina Bras varices status post band ligation. Hiatal hernia. Portal gastropathy.  . Umbilical hernia repair  04/05/2012    Procedure: HERNIA REPAIR UMBILICAL ADULT;  Surgeon: Donato Heinz, MD;  Location: [facility];  Service: General;  Laterality: N/A;  . Esophagogastroduodenoscopy  04/11/2012    Dr. Bridgett Larsson ulcer possibly at previous banding sites with stigmata     of bleeding.  Attempt at hemostasis with hemoclip and banding was not successful resulting in recurrence of bleed/  Portal gastropathy, but no evidence of gastric varices/ Recurrent esophageal varices grade 2.  . Esophagogastroduodenoscopy  05/24/2012    Dr. Larina Bras varices, portal gastropathy  . Esophagogastroduodenoscopy  04/11/12    Dr. Drue Novel in the distal esophagus adherent lot and visible vessel treated with bicap. grade I varices in the distal esophagus, one ligating band placed, portal hypertensive gastropathy in the body of the stomach.  . Colonoscopy  09/29/2012    [id number] varices. Scattered pancolonic diverticula/single cecal polyp-removed as described above  . Esophagogastroduodenoscopy  12/24/2012    Dr. Lynwood Dawley significant upper GI bleed secondary to bleeding esophageal varices. s/p hemostasis therapy with injection of a total of 23mL of 5% ethanolamine and application of 9  bands, complete egd not carried out.  . Schlerotherapy  12/24/2012    Procedure: Woodward Ku OF VARICES;  Surgeon: Daneil Dolin, MD;  Location: [facility];  Service: Endoscopy;  Laterality: N/A;  . Esophageal banding  12/24/2012    [id number] significant upper GI bleed secondary to bleeding esophageal varices  . Tips procedure  12/2012    [facility]  . Cataract extraction, bilateral    . Hernia repair      umbilical  . Eye surgery      bilateral cataract surgery with lens implants  . Transurethral resection of bladder tumor N/A 03/12/2015    Procedure: TRANSURETHRAL RESECTION OF BLADDER TUMOR (TURBT);  Surgeon: Raynelle Bring, MD;  Location: [facility];  Service: Urology;  Laterality: N/A;  . Cystoscopy w/ ureteral stent placement Bilateral 03/12/2015    Procedure: CYSTOSCOPY WITH BILATERAL RETROGRADE PYELOGRAM/RIGHT URETEROSCOPY;  Surgeon: Raynelle Bring, MD;  Location: [facility];  Service: Urology;  Laterality: Bilateral;  . Circumcision N/A 03/12/2015    Procedure: CIRCUMCISION ADULT;  Surgeon: Raynelle Bring, MD;  Location: [facility];  Service: Urology;  Laterality: N/A;      Social History:     Social History  Substance Use Topics  . Smoking status: Current Every Day Smoker -- 1.00 packs/day for 30 years    Types: Cigarettes  . Smokeless tobacco: Never Used  . Alcohol Use: No     Comment: drank heavily for few years in 60s     Lives - At home with wife  Mobility - independent     Family History :     Family History  Problem Relation Age of Onset  . Cirrhosis Father     etoh  . Colon cancer Neg Hx   . Anesthesia problems Neg Hx   . Hypotension Neg Hx   . Malignant hyperthermia Neg Hx   . Pseudochol deficiency Neg Hx   . Kidney cancer Mother   . Cancer Mother   . HIV Brother   . Cirrhosis Brother     nash      Home  Medications:   Prior to Admission medications   Medication Sig Start Date End Date Taking? Authorizing Provider  furosemide (LASIX) 40 MG tablet  TAKE TWO TABLETS BY MOUTH IN THE MORNING 03/06/16  Yes Manon Hilding Kefalas, PA-C  gabapentin (NEURONTIN) 300 MG capsule TAKE ONE CAPSULE BY MOUTH IN THE MORNING, ONE CAPSULE AT NOON AND THREE CAPSULES AT BEDTIME. Patient taking differently: TAKE TWO CAPSULES IN THE MORNING AND 2 CAPSULES AT BEDTIME 11/29/15  Yes Susy Frizzle, MD  Insulin Glargine (TOUJEO SOLOSTAR) 300 UNIT/ML SOPN Inject 40 Units into the skin daily. 09/03/15  Yes Susy Frizzle, MD  insulin regular human CONCENTRATED (HUMULIN R) 500 UNIT/ML SOLN injection Inject 60 Units into the skin 3 (three) times daily.    Yes Historical Provider, MD  lactulose (CHRONULAC) 10 GM/15ML solution Take 15CC orally up to three times a day to achieve 2 to 3 soft BMs daily. Patient taking differently: Take 10 g by mouth 4 (four) times daily.  08/01/14  Yes Mahala Menghini, PA-C  metolazone (ZAROXOLYN) 2.5 MG tablet Take 2 times a week Monday and Thursday, 30 minutes before lasix dose Patient taking differently: Take 2.5 mg by mouth 2 (two) times a week. Take 2 times a week Monday and Thursday, 30 minutes before lasix dose 02/07/16  Yes Fay Records, MD  omeprazole (PRILOSEC) 40 MG capsule TAKE ONE CAPSULE BY MOUTH ONCE DAILY 12/24/15  Yes Carlis Stable, NP  oxyCODONE (ROXICODONE) 5 MG immediate release tablet Take 1 tablet (5 mg total) by mouth every 4 (four) hours as needed for severe pain. 07/02/15  Yes Tanna Furry, MD  potassium chloride 20 MEQ TBCR Take 20 mEq by mouth 2 (two) times daily. 07/02/15  Yes Tanna Furry, MD  propranolol (INDERAL) 20 MG tablet TAKE ONE TABLET BY MOUTH TWICE DAILY 03/03/16  Yes Orvil Feil, NP  spironolactone (ALDACTONE) 50 MG tablet Take 50 mg by mouth 2 (two) times daily.  03/03/14  Yes Historical Provider, MD  Suvorexant (BELSOMRA) 10 MG TABS Take 10 mg by mouth at bedtime as needed. Patient taking differently: Take 10 mg by mouth at bedtime as needed (for sleep).  10/29/15  Yes Susy Frizzle, MD  triamcinolone cream (KENALOG) 0.1 %  Apply 1 application topically 2 (two) times daily. 07/02/15  Yes Tanna Furry, MD  XIFAXAN 550 MG TABS tablet TAKE ONE TABLET BY MOUTH TWICE DAILY 03/03/16  Yes Orvil Feil, NP  ACCU-CHEK AVIVA PLUS test strip USE TEST STRIPS TO CHECK BLOOD SUGAR THREE TIMES DAILY AS DIRECTED 12/17/15   Orlena Sheldon, PA-C  Insulin Pen Needle 29G X 12.7MM MISC Use with Toujeo solostar 09/10/15   Susy Frizzle, MD  Insulin Syringes, Disposable, (B-D INSULIN SYRINGE 1CC) U-100 1 ML MISC Inject 60 Units into the skin 3 (three) times daily.    Historical Provider, MD     Allergies:     Allergies  Allergen Reactions  . Aspirin Other (See Comments)    Harms liver.   . Ibuprofen Other (See Comments)    Can't take per Dr Gala Romney- harms livers  . Tylenol [Acetaminophen] Other (See Comments)    Restless Legs      Physical Exam:   Vitals  Blood pressure 103/61, pulse 75, temperature 97.8 F (36.6 C), temperature source Oral, resp. rate 17, height 5\' 7"  (1.702 m), weight 113.399 kg (250 lb), SpO2 98 %.   Middle aged male lying in bed not in distress HEENT: No pallor,  icteric, moist mucosa, supple neck, no cervical lymphadenopathy, no JVD Chest: Clear to auscultation bilaterally, no added sounds CVS: Normal S1 and S2, no murmurs rub or gallop GI: Soft, nondistended, superficial dry skin lesions (chronic), bowel sounds present, no scrotal swelling Musculoskeletal: Warm, no edema CNS: Alert and oriented, mild flapping tremors        Data Review:    CBC  Recent Labs Lab 03/18/16 1449 03/19/16 1619  WBC 6.6 7.2  HGB 15.3 13.9  HCT 42.9 38.0*  PLT 65* 57*  MCV 99.5 96.7  MCH 35.5* 35.4*  MCHC 35.7 36.6*  RDW 15.6* 14.9  LYMPHSABS 1452 1.7  MONOABS 726 0.8  EOSABS 132 0.1  BASOSABS 0 0.0   ------------------------------------------------------------------------------------------------------------------  Chemistries   Recent Labs Lab 03/18/16 1449 03/19/16 1619  NA 127* 127*  K 3.7 3.9    CL 84* 83*  CO2 26 27  GLUCOSE 439* 568*  BUN 59* 65*  CREATININE 3.00* 2.90*  CALCIUM 9.4 9.6  AST 48* 55*  ALT 31 35  ALKPHOS 106 127*  BILITOT 7.9* 7.4*   ------------------------------------------------------------------------------------------------------------------ estimated creatinine clearance is 33.4 mL/min (by C-G formula based on Cr of 2.9). ------------------------------------------------------------------------------------------------------------------ No results for input(s): TSH, T4TOTAL, T3FREE, THYROIDAB in the last 72 hours.  Invalid input(s): FREET3  Coagulation profile  Recent Labs Lab 03/18/16 1529  INR 1.3*   ------------------------------------------------------------------------------------------------------------------- No results for input(s): DDIMER in the last 72 hours. -------------------------------------------------------------------------------------------------------------------  Cardiac Enzymes No results for input(s): CKMB, TROPONINI, MYOGLOBIN in the last 168 hours.  Invalid input(s): CK ------------------------------------------------------------------------------------------------------------------    Component Value Date/Time   BNP 126.0* 12/27/2015 1900     ---------------------------------------------------------------------------------------------------------------  Urinalysis    Component Value Date/Time   COLORURINE YELLOW 03/19/2016 1720   APPEARANCEUR CLEAR 03/19/2016 1720   LABSPEC 1.015 03/19/2016 1720   PHURINE 5.5 03/19/2016 1720   GLUCOSEU >1000* 03/19/2016 1720   HGBUR NEGATIVE 03/19/2016 1720   HGBUR trace-intact 03/09/2009 1039   BILIRUBINUR NEGATIVE 03/19/2016 1720   KETONESUR NEGATIVE 03/19/2016 1720   PROTEINUR NEGATIVE 03/19/2016 1720   UROBILINOGEN 0.2 07/02/2015 1305   NITRITE NEGATIVE 03/19/2016 1720   LEUKOCYTESUR NEGATIVE 03/19/2016 1720     ----------------------------------------------------------------------------------------------------------------   Imaging Results:    No results found.  EKG: Pending   Assessment & Plan:    Principal Problem:   DKA, type 2 ([place]) Admit to stepdown. Glucose stabilizer protocol with IV insulin. Received 1 L IV normal saline bolus in the ED followed by normal saline at 1 25 mL/h. Check BMET every 4 hours on telemetry until anion gap closed and then overlap with 45 Units of tuojeo and 60 units of concentrated Humalog 3 times a day with meals. Diabetic coordinator consult. Check A1c.    Active Problems: Acute kidney injury Suspected due to dehydration with DKA and concomitant use of Aleve. Hold Lasix, metolazone and Aldactone. Patient appears dry on exam. Monitor with IV fluids. Instructed strictly to avoid NSAIDs. Monitor renal function closely.  Decompensated nonalcoholic hepatic cirrhosis Associated thrombocytopenia, hyperbilirubinemia and hepatic encephalopathy. Lactulose dose increased by PCP yesterday (15 g 3 times a day). He has had 3 bowel movements since then already and mental status back doors normal. I would continue this dose. Continue rifaximin. Continue propanolol and PPI. Consult GI as needed. Noted for worsened total bilirubin in the past 2 months.Monitor closely. Hold Neurontin and Belsorma ( for sleep prn). Will resume low dose oxycodone as needed for headache. (Patient cannot have NSAIDs and has allergy to Tylenol).  Bladder cancer ([place]) with recurrence Status post TURBT in 2010 and 02/2015. Stable. Follows with Dr. Alinda Money.  Ongoing tobacco use  counseled strongly on cessation. Smokes half pack a day. Would place a nicotine patch while in the hospital  Pseudohyponatremia Secondary to DKA. Corrected sodium of 135.   DVT Prophylaxis SCDs  Diet: Diabetic  AM Labs Ordered, also please review Full Orders  Family Communication: Discussed in detail with  patient's wife at bedside  Code Status : Full code  Likely DC to  home in the next 2-3 days  Condition: Guarded  Consults called: None   Admission status: Inpatient    Time spent in minutes : 70   Louellen Molder M.D on 03/19/2016 at 6:42 PM  Between 7am to 7pm - Pager - [phone]. After 7pm go to [email or web address] - password [facility]  [facility] - Office  [phone]

## 2016-03-19 NOTE — ED Notes (Signed)
Pt sent by Sophia for abnormal labs. Patient unsure of labs. Patient alert and oriented.

## 2016-03-19 NOTE — ED Notes (Signed)
Hospitalist at bedside 

## 2016-03-20 ENCOUNTER — Ambulatory Visit: Payer: Medicaid Other | Admitting: Family Medicine

## 2016-03-20 DIAGNOSIS — E1310 Other specified diabetes mellitus with ketoacidosis without coma: Principal | ICD-10-CM

## 2016-03-20 DIAGNOSIS — F17200 Nicotine dependence, unspecified, uncomplicated: Secondary | ICD-10-CM

## 2016-03-20 DIAGNOSIS — D500 Iron deficiency anemia secondary to blood loss (chronic): Secondary | ICD-10-CM

## 2016-03-20 DIAGNOSIS — E876 Hypokalemia: Secondary | ICD-10-CM | POA: Diagnosis not present

## 2016-03-20 DIAGNOSIS — K7290 Hepatic failure, unspecified without coma: Secondary | ICD-10-CM

## 2016-03-20 DIAGNOSIS — E871 Hypo-osmolality and hyponatremia: Secondary | ICD-10-CM

## 2016-03-20 DIAGNOSIS — N179 Acute kidney failure, unspecified: Secondary | ICD-10-CM

## 2016-03-20 DIAGNOSIS — K7581 Nonalcoholic steatohepatitis (NASH): Secondary | ICD-10-CM

## 2016-03-20 DIAGNOSIS — D6959 Other secondary thrombocytopenia: Secondary | ICD-10-CM

## 2016-03-20 DIAGNOSIS — K7460 Unspecified cirrhosis of liver: Secondary | ICD-10-CM | POA: Diagnosis present

## 2016-03-20 DIAGNOSIS — K7030 Alcoholic cirrhosis of liver without ascites: Secondary | ICD-10-CM

## 2016-03-20 LAB — HEPATIC FUNCTION PANEL
ALBUMIN: 2.6 g/dL — AB (ref 3.5–5.0)
ALK PHOS: 108 U/L (ref 38–126)
ALT: 33 U/L (ref 17–63)
AST: 52 U/L — AB (ref 15–41)
BILIRUBIN DIRECT: 2.4 mg/dL — AB (ref 0.1–0.5)
BILIRUBIN TOTAL: 6.7 mg/dL — AB (ref 0.3–1.2)
Indirect Bilirubin: 4.3 mg/dL — ABNORMAL HIGH (ref 0.3–0.9)
Total Protein: 7.3 g/dL (ref 6.5–8.1)

## 2016-03-20 LAB — CBC
HEMATOCRIT: 37.1 % — AB (ref 39.0–52.0)
HEMOGLOBIN: 13.6 g/dL (ref 13.0–17.0)
MCH: 35.2 pg — ABNORMAL HIGH (ref 26.0–34.0)
MCHC: 36.7 g/dL — ABNORMAL HIGH (ref 30.0–36.0)
MCV: 96.1 fL (ref 78.0–100.0)
Platelets: 55 10*3/uL — ABNORMAL LOW (ref 150–400)
RBC: 3.86 MIL/uL — ABNORMAL LOW (ref 4.22–5.81)
RDW: 15.2 % (ref 11.5–15.5)
WBC: 8 10*3/uL (ref 4.0–10.5)

## 2016-03-20 LAB — GLUCOSE, CAPILLARY
GLUCOSE-CAPILLARY: 154 mg/dL — AB (ref 65–99)
GLUCOSE-CAPILLARY: 195 mg/dL — AB (ref 65–99)
GLUCOSE-CAPILLARY: 202 mg/dL — AB (ref 65–99)
GLUCOSE-CAPILLARY: 207 mg/dL — AB (ref 65–99)
GLUCOSE-CAPILLARY: 260 mg/dL — AB (ref 65–99)
GLUCOSE-CAPILLARY: 264 mg/dL — AB (ref 65–99)
GLUCOSE-CAPILLARY: 264 mg/dL — AB (ref 65–99)
GLUCOSE-CAPILLARY: 291 mg/dL — AB (ref 65–99)
GLUCOSE-CAPILLARY: 295 mg/dL — AB (ref 65–99)
GLUCOSE-CAPILLARY: 344 mg/dL — AB (ref 65–99)
Glucose-Capillary: 421 mg/dL — ABNORMAL HIGH (ref 65–99)
Glucose-Capillary: 207 mg/dL — ABNORMAL HIGH (ref 65–99)
Glucose-Capillary: 118 mg/dL — ABNORMAL HIGH (ref 65–99)
Glucose-Capillary: 100 mg/dL — ABNORMAL HIGH (ref 65–99)
Glucose-Capillary: 119 mg/dL — ABNORMAL HIGH (ref 65–99)
Glucose-Capillary: 141 mg/dL — ABNORMAL HIGH (ref 65–99)
Glucose-Capillary: 181 mg/dL — ABNORMAL HIGH (ref 65–99)
Glucose-Capillary: 215 mg/dL — ABNORMAL HIGH (ref 65–99)
Glucose-Capillary: 257 mg/dL — ABNORMAL HIGH (ref 65–99)

## 2016-03-20 LAB — BASIC METABOLIC PANEL
ANION GAP: 17 — AB (ref 5–15)
ANION GAP: 13 (ref 5–15)
Anion gap: 14 (ref 5–15)
BUN: 62 mg/dL — ABNORMAL HIGH (ref 6–20)
BUN: 57 mg/dL — ABNORMAL HIGH (ref 6–20)
BUN: 57 mg/dL — AB (ref 6–20)
CALCIUM: 9.2 mg/dL (ref 8.9–10.3)
CALCIUM: 9 mg/dL (ref 8.9–10.3)
CHLORIDE: 90 mmol/L — AB (ref 101–111)
CHLORIDE: 91 mmol/L — AB (ref 101–111)
CO2: 26 mmol/L (ref 22–32)
CO2: 28 mmol/L (ref 22–32)
CO2: 26 mmol/L (ref 22–32)
CREATININE: 2.8 mg/dL — AB (ref 0.61–1.24)
CREATININE: 2.63 mg/dL — AB (ref 0.61–1.24)
Calcium: 9.4 mg/dL (ref 8.9–10.3)
Chloride: 92 mmol/L — ABNORMAL LOW (ref 101–111)
Creatinine, Ser: 2.57 mg/dL — ABNORMAL HIGH (ref 0.61–1.24)
GFR calc Af Amer: 27 mL/min — ABNORMAL LOW (ref >60)
GFR calc Af Amer: 30 mL/min — ABNORMAL LOW (ref >60)
GFR calc non Af Amer: 23 mL/min — ABNORMAL LOW (ref >60)
GFR, EST AFRICAN AMERICAN: 29 mL/min — AB (ref >60)
GFR, EST NON AFRICAN AMERICAN: 25 mL/min — AB (ref >60)
GFR, EST NON AFRICAN AMERICAN: 26 mL/min — AB (ref >60)
GLUCOSE: 205 mg/dL — AB (ref 65–99)
GLUCOSE: 246 mg/dL — AB (ref 65–99)
Glucose, Bld: 153 mg/dL — ABNORMAL HIGH (ref 65–99)
POTASSIUM: 3.4 mmol/L — AB (ref 3.5–5.1)
Potassium: 3.1 mmol/L — ABNORMAL LOW (ref 3.5–5.1)
Potassium: 3.7 mmol/L (ref 3.5–5.1)
SODIUM: 133 mmol/L — AB (ref 135–145)
Sodium: 133 mmol/L — ABNORMAL LOW (ref 135–145)
Sodium: 131 mmol/L — ABNORMAL LOW (ref 135–145)

## 2016-03-20 LAB — AMMONIA: Ammonia: 89 umol/L — ABNORMAL HIGH (ref 9–35)

## 2016-03-20 MED ORDER — INSULIN ASPART 100 UNIT/ML ~~LOC~~ SOLN
0.0000 [IU] | SUBCUTANEOUS | Status: DC
  Administered 2016-03-20: 5 [IU] via SUBCUTANEOUS
  Administered 2016-03-20: 8 [IU] via SUBCUTANEOUS
  Administered 2016-03-20: 11 [IU] via SUBCUTANEOUS
  Administered 2016-03-21: 5 [IU] via SUBCUTANEOUS
  Administered 2016-03-21 (×3): 8 [IU] via SUBCUTANEOUS

## 2016-03-20 MED ORDER — INSULIN GLARGINE 100 UNIT/ML ~~LOC~~ SOLN
40.0000 [IU] | Freq: Every day | SUBCUTANEOUS | Status: DC
  Administered 2016-03-20 – 2016-03-21 (×2): 40 [IU] via SUBCUTANEOUS
  Filled 2016-03-20 (×5): qty 0.4

## 2016-03-20 MED ORDER — POTASSIUM CHLORIDE IN NACL 20-0.9 MEQ/L-% IV SOLN
INTRAVENOUS | Status: DC
  Administered 2016-03-20: 14:00:00 via INTRAVENOUS

## 2016-03-20 MED ORDER — POTASSIUM CHLORIDE 10 MEQ/100ML IV SOLN
10.0000 meq | INTRAVENOUS | Status: AC
  Administered 2016-03-20 (×4): 10 meq via INTRAVENOUS
  Filled 2016-03-20: qty 100

## 2016-03-20 NOTE — Progress Notes (Signed)
Inpatient Diabetes Program Recommendations  AACE/ADA: New Consensus Statement on Inpatient Glycemic Control (2015)  Target Ranges:  Prepandial:   less than 140 mg/dL      Peak postprandial:   less than 180 mg/dL (1-2 hours)      Critically ill patients:  140 - 180 mg/dL   Review of Glycemic Control  Diabetes history: DM Type 2 Outpatient Diabetes medications: Lantus 40 + Novolin Reg insulin 60 tid Current orders for Inpatient glycemic control: Transitioned from IV insulin drip to Lantus 40 units daily + Novolog Moderate scale tid  Inpatient Diabetes Program Recommendations:  Please consider adding meal coverage 10 units tid with meals. Hold meal coverage if patient eats< 50%.  Thank you, Nani Gasser. Francene Mcerlean, RN, MSN, CDE Inpatient Glycemic Control Team Team Pager [phone] (8am-5pm) 03/20/2016 11:35 AM

## 2016-03-20 NOTE — Progress Notes (Signed)
PROGRESS NOTE    Kyle [name]  [id number] DOB: 1956-06-27 DOA: 03/19/2016 PCP: Odette Fraction, MD  Outpatient Specialists: Dr. Consuella Lose; Dr. Harrington Challenger cardiology; Dr. Whitney Muse oncology/hematology; [facility]; Dr. Alinda Money urology.    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 In the ED patient's vitals were stable. Blood work showed WBC of 7.2, hemoglobin of 13.9, platelets of 57, sodium of 127, chloride of 83 with a gap of 17. BUN and creatinine were elevated to 65 and 2.90. Blood glucose was 568. Chest x-ray was unremarkable. UA sent. LFTs showed mildly elevated AST and total bili of 7.4. (Increased from 4.92 months ago).Ammonia level was 114. He was admitted for hepatic encephalopathy, DKA, and acute renal failure.   Assessment & Plan:   Principal Problem:   DKA, type 2 ([place]) Active Problems:   Acute kidney injury ([place])   Esophageal varices ([place])   Decompensated liver disease ([place])   Hepatic encephalopathy syndrome (HCC)   Liver cirrhosis secondary to [name]   Malignant neoplasm of bladder (HCC)   Iron deficiency anemia due to chronic blood loss   SMOKER   Thrombocytopenia due to hypersplenism   Hyponatremia   Hypokalemia   1. DKA and type 2 diabetes. Patient is treated chronically with Lantus and Humulin R. His blood glucose was 568 on admission and his anion gap was 17. He was started on vigorous IV fluids and the insulin drip Glucomander protocol. His electrolytes were monitored closely. Following treatment, his anion gap normalized. Insulin drip was discontinued and Lantus with sliding scale NovoLog were  started. -Hemoglobin A1c ordered and pending.  Acute kidney injury. Patient's BUN was 65 and creatinine was 2.90 on admission. His creatinine was 1.076 months ago and 1.62 months ago. -Apparently, he had been started on vigorous diuretic therapy for lower extremity edema over the past few months; presumably from cirrhosis. He is treated with Zaroxolyn, spironolactone, and Lasix. -He was started on vigorous IV fluids and his diuretics were withheld. There is no significant peripheral edema on exam. -We'll continue IV fluids and holding the diuretics. We'll continue to monitor his renal function. -Etiology likely from prerenal azotemia.  Hyponatremia. Patient serum sodium was 127 on admission. His diuretics were held and he was started on vigorous IV fluids with normal saline. His serum sodium is improving. -We'll continue IV fluid hydration with normal saline. We'll continue holding diuretics for now. -Etiology likely secondary to DKA in the setting of volume depletion.  Hypokalemia. Patient serum potassium fell to 3.1 following correction of DKA. He was started on potassium chloride and given several IV runs of potassium. His serum potassium has improved.  Karlene Lineman cirrhosis with resultant thrombocytopenia, hypersplenism, esophageal varices, and hyperbilirubinemia. Patient is followed by Dr. Gala Romney in [place] and [facility] transplant center. Apparently patient is on the transplant list. He is treated chronically with diuretics, propanolol, and lactulose and rifaximin. -We'll continue propanolol, lactulose, rifaximin restarted. -His platelet count appears to be at baseline, ranging from 55-65. -We'll order PT and PTT for tomorrow.  Hepatic encephalopathy. Apparently, the patient has some confusion in the outpatient setting. Outpatient laboratory studies per his PCP, Dr. Dennard Schaumann revealed an elevated ammonia level. The dose of lactulose was increased to 15  g 3 times a day by his PCP, which was  continued on admission. -On follow-up, patient has no sign of hepatic encephalopathy. -We'll order a follow-up ammonia level.  Tobacco abuse. The patient was advised to stop smoking. Nicotine patch ordered and will be continued. Will order tobacco cessation counseling.    DVT prophylaxis: SCDs Code Status: Full code Family Communication: discussed with patient; family not available Disposition Plan: discharge home, likely in the next 24-48 hours    Consultants:   None  Procedures:   None  Antimicrobials: None  None   Subjective: Patient says he's feeling better. He denies confusion. He denies chest pain or shortness of breath.  Objective: Filed Vitals:   03/20/16 0500 03/20/16 0600 03/20/16 0700 03/20/16 0731  BP: 118/66 100/63 105/69 105/69  Pulse: 70 71 74 78  Temp:      TempSrc:      Resp: 14 16 13    Height:      Weight:      SpO2: 93% 93% 94%     Intake/Output Summary (Last 24 hours) at 03/20/16 0903 Last data filed at 03/20/16 0700  Gross per 24 hour  Intake 2496.9 ml  Output   1700 ml  Net  796.9 ml   Filed Weights   03/19/16 1543  Weight: 113.399 kg (250 lb)    Examination:  General exam: Appears calm and comfortable; no acute distress.  Respiratory system: Clear to auscultation. Respiratory effort normal. Cardiovascular system: S1, S2, no murmurs rubs or gallops. No pedal edema. Gastrointestinal system: Abdomen is nondistended, soft and nontender. No organomegaly or masses felt. Normal bowel sounds heard. Central nervous system/neurologic: Alert and oriented 3. No focal neurological deficits. No asterixis. Extremities: No acute hot red joints. Skin: No rashes, lesions or ulcers Psychiatry: Judgement and insight appear normal. Mood & affect appropriate.     Data Reviewed: I have personally reviewed following labs and imaging studies  CBC:  Recent Labs Lab 03/18/16 1449 03/19/16 1619 03/20/16 0201  WBC 6.6 7.2 8.0  NEUTROABS 4290  4.6  --   HGB 15.3 13.9 13.6  HCT 42.9 38.0* 37.1*  MCV 99.5 96.7 96.1  PLT 65* 57* 55*   Basic Metabolic Panel:  Recent Labs Lab 03/18/16 1449 03/19/16 1619 03/19/16 1930 03/20/16 0203 03/20/16 0753  NA 127* 127* 126* 133* 133*  K 3.7 3.9 3.8 3.1* 3.7  CL 84* 83* 83* 90* 91*  CO2 26 27 27 26 28   GLUCOSE 439* 568* 567* 205* 153*  BUN 59* 65* 65* 62* 57*  CREATININE 3.00* 2.90* 2.98* 2.80* 2.63*  CALCIUM 9.4 9.6 9.5 9.2 9.4  MG  --  1.8  --   --   --    GFR: Estimated Creatinine Clearance: 36.8 mL/min (by C-G formula based on Cr of 2.63). Liver Function Tests:  Recent Labs Lab 03/18/16 1449 03/19/16 1619 03/20/16 0201  AST 48* 55* 52*  ALT 31 35 33  ALKPHOS 106 127* 108  BILITOT 7.9* 7.4* 6.7*  PROT 7.5 7.6 7.3  ALBUMIN 3.0* 2.7* 2.6*   No results for input(s): LIPASE, AMYLASE in the last 168 hours.  Recent Labs Lab 03/19/16 1619  AMMONIA 114*   Coagulation Profile:  Recent Labs Lab 03/18/16 1529  INR 1.3*   Cardiac Enzymes: No results for input(s): CKTOTAL, CKMB, CKMBINDEX, TROPONINI in the last 168 hours. BNP (last 3 results) No results for input(s): PROBNP in the last 8760 hours. HbA1C: No results for input(s): HGBA1C in the last 72  hours. CBG:  Recent Labs Lab 03/20/16 0429 03/20/16 0531 03/20/16 0621 03/20/16 0724 03/20/16 0835  GLUCAP 118* 100* 119* 141* 181*   Lipid Profile: No results for input(s): CHOL, HDL, LDLCALC, TRIG, CHOLHDL, LDLDIRECT in the last 72 hours. Thyroid Function Tests: No results for input(s): TSH, T4TOTAL, FREET4, T3FREE, THYROIDAB in the last 72 hours. Anemia Panel: No results for input(s): VITAMINB12, FOLATE, FERRITIN, TIBC, IRON, RETICCTPCT in the last 72 hours. Urine analysis:    Component Value Date/Time   COLORURINE YELLOW 03/19/2016 1720   APPEARANCEUR CLEAR 03/19/2016 1720   LABSPEC 1.015 03/19/2016 1720   PHURINE 5.5 03/19/2016 1720   GLUCOSEU >1000* 03/19/2016 1720   HGBUR NEGATIVE 03/19/2016  1720   HGBUR trace-intact 03/09/2009 1039   BILIRUBINUR NEGATIVE 03/19/2016 1720   KETONESUR NEGATIVE 03/19/2016 1720   PROTEINUR NEGATIVE 03/19/2016 1720   UROBILINOGEN 0.2 07/02/2015 1305   NITRITE NEGATIVE 03/19/2016 1720   LEUKOCYTESUR NEGATIVE 03/19/2016 1720   Sepsis Labs: @LABRCNTIP (procalcitonin:4,lacticidven:4)  ) Recent Results (from the past 240 hour(s))  MRSA PCR Screening     Status: None   Collection Time: 03/19/16  7:30 PM  Result Value Ref Range Status   MRSA by PCR NEGATIVE NEGATIVE Final    Comment:        The GeneXpert MRSA Assay (FDA approved for NASAL specimens only), is one component of a comprehensive MRSA colonization surveillance program. It is not intended to diagnose MRSA infection nor to guide or monitor treatment for MRSA infections.          Radiology Studies: No results found.      Scheduled Meds: . lactulose  15 g Oral TID  . nicotine  14 mg Transdermal Daily  . pantoprazole  80 mg Oral Daily  . potassium chloride  20 mEq Oral BID  . propranolol  20 mg Oral BID  . rifaximin  550 mg Oral BID   Continuous Infusions: . sodium chloride Stopped (03/20/16 0139)  . dextrose 5 % and 0.45% NaCl 75 mL/hr at 03/20/16 0500  . insulin (NOVOLIN-R) infusion 5.4 Units/hr (03/20/16 0830)     LOS: 1 day    Time spent: 43 minutes    Rexene Alberts, MD [facility] Pager [phone]   If 7PM-7AM, please contact night-coverage [email or web address] Password [facility] 03/20/2016, 9:03 AM

## 2016-03-20 NOTE — Care Management Note (Signed)
Case Management Note  Patient Details  Name: Kyle [name] MRN: [id number] Date of Birth: [birth year]  Subjective/Objective:                  Pt admitted with DKA. Pt is from home, and has little family support. Pt plans to return home with self care. Pt has PCP but can not remember their name. Pt drives himself to appointments. Pt has no difficulty affording his medications. Pt feels he has no needs at DC.   Action/Plan: No CM needs anticipated.   Expected Discharge Date:      03/22/2016            Expected Discharge Plan:  Home/Self Care  In-House Referral:  NA  Discharge planning Services  CM Consult  Post Acute Care Choice:  NA Choice offered to:  NA  DME Arranged:    DME Agency:     HH Arranged:    HH Agency:     Status of Service:  Completed, signed off  Medicare Important Message Given:    Date Medicare IM Given:    Medicare IM give by:    Date Additional Medicare IM Given:    Additional Medicare Important Message give by:     If discussed at [place] of Stay Meetings, dates discussed:    Additional Comments:  Sherald Barge, RN 03/20/2016, 2:08 PM

## 2016-03-21 ENCOUNTER — Encounter (HOSPITAL_COMMUNITY): Payer: Self-pay | Admitting: Internal Medicine

## 2016-03-21 ENCOUNTER — Other Ambulatory Visit (HOSPITAL_COMMUNITY): Payer: Medicaid Other

## 2016-03-21 ENCOUNTER — Ambulatory Visit (HOSPITAL_COMMUNITY): Payer: Medicaid Other | Admitting: Oncology

## 2016-03-21 DIAGNOSIS — I5032 Chronic diastolic (congestive) heart failure: Secondary | ICD-10-CM

## 2016-03-21 DIAGNOSIS — N183 Chronic kidney disease, stage 3 unspecified: Secondary | ICD-10-CM | POA: Diagnosis present

## 2016-03-21 DIAGNOSIS — K7581 Nonalcoholic steatohepatitis (NASH): Secondary | ICD-10-CM

## 2016-03-21 HISTORY — DX: Chronic diastolic (congestive) heart failure: I50.32

## 2016-03-21 HISTORY — DX: Chronic kidney disease, stage 3 unspecified: N18.30

## 2016-03-21 LAB — CBC
HEMATOCRIT: 37.5 % — AB (ref 39.0–52.0)
HEMOGLOBIN: 13.6 g/dL (ref 13.0–17.0)
MCH: 35.5 pg — ABNORMAL HIGH (ref 26.0–34.0)
MCHC: 36.3 g/dL — AB (ref 30.0–36.0)
MCV: 97.9 fL (ref 78.0–100.0)
Platelets: 47 10*3/uL — ABNORMAL LOW (ref 150–400)
RBC: 3.83 MIL/uL — AB (ref 4.22–5.81)
RDW: 15.4 % (ref 11.5–15.5)
WBC: 6.9 10*3/uL (ref 4.0–10.5)

## 2016-03-21 LAB — COMPREHENSIVE METABOLIC PANEL
ALBUMIN: 2.5 g/dL — AB (ref 3.5–5.0)
ALK PHOS: 72 U/L (ref 38–126)
ALT: 31 U/L (ref 17–63)
ANION GAP: 13 (ref 5–15)
AST: 54 U/L — ABNORMAL HIGH (ref 15–41)
BILIRUBIN TOTAL: 6.4 mg/dL — AB (ref 0.3–1.2)
BUN: 48 mg/dL — ABNORMAL HIGH (ref 6–20)
CALCIUM: 9.3 mg/dL (ref 8.9–10.3)
CO2: 24 mmol/L (ref 22–32)
Chloride: 96 mmol/L — ABNORMAL LOW (ref 101–111)
Creatinine, Ser: 2.16 mg/dL — ABNORMAL HIGH (ref 0.61–1.24)
GFR calc non Af Amer: 32 mL/min — ABNORMAL LOW (ref >60)
GFR, EST AFRICAN AMERICAN: 37 mL/min — AB (ref >60)
GLUCOSE: 289 mg/dL — AB (ref 65–99)
POTASSIUM: 4 mmol/L (ref 3.5–5.1)
Sodium: 133 mmol/L — ABNORMAL LOW (ref 135–145)
TOTAL PROTEIN: 6.9 g/dL (ref 6.5–8.1)

## 2016-03-21 LAB — LACTIC ACID, PLASMA: LACTIC ACID: 47 mg/dL — ABNORMAL HIGH (ref 4–16)

## 2016-03-21 LAB — GLUCOSE, CAPILLARY
GLUCOSE-CAPILLARY: 273 mg/dL — AB (ref 65–99)
GLUCOSE-CAPILLARY: 211 mg/dL — AB (ref 65–99)
GLUCOSE-CAPILLARY: 274 mg/dL — AB (ref 65–99)

## 2016-03-21 LAB — AMMONIA: AMMONIA: 107 umol/L — AB (ref 9–35)

## 2016-03-21 LAB — HEMOGLOBIN A1C
Hgb A1c MFr Bld: 11 % — ABNORMAL HIGH (ref 4.8–5.6)
MEAN PLASMA GLUCOSE: 269 mg/dL

## 2016-03-21 MED ORDER — GABAPENTIN 300 MG PO CAPS: 300.0000 mg | ORAL_CAPSULE | Freq: Every day | ORAL | Status: DC

## 2016-03-21 MED ORDER — LACTULOSE 10 GM/15ML PO SOLN: 30.0000 g | Freq: Three times a day (TID) | ORAL | Status: AC

## 2016-03-21 MED ORDER — FUROSEMIDE 40 MG PO TABS: ORAL_TABLET | ORAL | Status: DC

## 2016-03-21 MED ORDER — INSULIN ASPART 100 UNIT/ML ~~LOC~~ SOLN
10.0000 [IU] | Freq: Three times a day (TID) | SUBCUTANEOUS | Status: DC
Start: 2016-03-21 — End: 2016-03-21
  Administered 2016-03-21 (×2): 10 [IU] via SUBCUTANEOUS

## 2016-03-21 MED ORDER — METOLAZONE 2.5 MG PO TABS: ORAL_TABLET | ORAL | Status: DC

## 2016-03-21 MED ORDER — LACTULOSE 10 GM/15ML PO SOLN
30.0000 g | Freq: Three times a day (TID) | ORAL | Status: DC
  Administered 2016-03-21: 30 g via ORAL
  Filled 2016-03-21: qty 60

## 2016-03-21 NOTE — Discharge Summary (Signed)
Physician Discharge Summary  Kyle [name] [id number] DOB: 05-Sep-1956 DOA: 03/19/2016  PCP: Odette Fraction, MD  Admit date: 03/19/2016 Discharge date: 03/21/2016  Time spent: Greater than 30 minutes  Recommendations for Outpatient Follow-up:  1. Recommend follow-up of the patient's fluid status and electrolytes as the dose of Lasix was decreased and metolazone was decreased to when necessary only. 2. Recommend further outpatient management of diabetes. 3. Recommend follow-up ammonia level.    Discharge Diagnoses:    DKA, type 2 ([place])   Acute kidney injury ([place])   Esophageal varices ([place])   Decompensated liver disease ([place])   Hepatic encephalopathy syndrome (HCC)   Liver cirrhosis secondary to [name]   Hypokalemia   Chronic diastolic CHF (congestive heart failure) (HCC)   Malignant neoplasm of bladder (HCC)   Iron deficiency anemia due to chronic blood loss   SMOKER   Thrombocytopenia due to hypersplenism   Hyponatremia   Hyperbilirubinemia   CKD (chronic kidney disease), stage III   Discharge Condition: Improved.  Diet recommendation: Heart healthy/carbo hydrate modified.  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 In the ED patient's vitals were stable. Blood work showed WBC of 7.2, hemoglobin of 13.9, platelets of 57, sodium of 127, chloride of 83 with a gap of 17. BUN and creatinine were elevated to 65 and 2.90. Blood glucose was 568. Chest x-ray was unremarkable. UA sent. LFTs showed  mildly elevated AST and total bili of 7.4. (Increased from 4.92 months ago).Ammonia level was 114. He was admitted for hepatic encephalopathy, DKA, and acute renal failure.  Hospital Course:  1. DKA and type 2 diabetes. Patient is treated chronically with Lantus and Humulin R. His blood glucose was 568 on admission and his anion gap was 17. He was started on vigorous IV fluids and the insulin drip Glucomander protocol. His electrolytes were monitored closely. Following treatment, his anion gap normalized. Insulin drip was discontinued and Lantus with sliding scale NovoLog were started. He was actually started on less insulin than he is prescribed at home, with his CBGs ranging from the 150s to the 280s. In light of his admitted noncompliance at times, his home dosing of insulin was not changed. Will defer further management to his PCP. -Hemoglobin A1c was ordered and was 11.0.  Acute kidney injury with stage II-3 chronic kidney disease; secondary to prerenal azotemia. Patient's BUN was 65 and creatinine was 2.90 on admission. His creatinine was 1.07 six  months ago and 1.6 two months ago and 2.22 weeks ago.. -Per his account, he had been started on vigorous diuretic therapy for lower extremity edema over the past few months; presumably from cirrhosis. He is treated with Zaroxolyn, spironolactone, and Lasix. -He was started on vigorous IV fluids and his diuretics were withheld. There was no significant peripheral edema on exam. -His creatinine improved to 2.16, likely close to baseline prior to discharge. -At the time of discharge, he was instructed to decrease Lasix from 80 to 40 mg daily and to change metolazone to twice a week only if needed  for increase in leg swelling and to maintain spironolactone as previously prescribed. -Recommend follow-up of his fluid status and electrolytes.  Hyponatremia. Patient serum sodium was 127 on admission. His diuretics were held and he was started on vigorous  IV fluids with normal saline. His serum sodium improved to 133. -Etiology likely secondary to DKA in the setting of volume depletion.  Hypokalemia. Patient serum potassium fell to 3.1 following correction of DKA. He was started on potassium chloride and given several IV runs of potassium. His serum potassium improved.  Kyle [name] cirrhosis with resultant thrombocytopenia, hypersplenism, esophageal varices, and hyperbilirubinemia. Patient is followed by Dr. Gala Romney in [place] and [facility] transplant center. Apparently patient is on the transplant list. He is treated chronically with diuretics, propanolol, and lactulose and rifaximin. -He was continued on propanolol, lactulose, rifaximin. His PPI was also continued. -His platelet count appears to be at baseline, ranged from 55-65. -His bilirubin trended down from 7.4 to 6.7.  Hepatic encephalopathy. Apparently, the patient has some confusion in the outpatient setting. Outpatient laboratory studies per his PCP, Dr. Dennard Schaumann revealed an elevated ammonia level. The dose of lactulose was increased to 30 g- 4 times a day by his PCP, but the patient stated he had been taking 15 g 3 times daily. -Follow-up ammonia level improved to 89, but stabilized at 107. He had no signs of hepatic encephalopathy. -He was instructed to increase lactulose to 30 g 4 times daily as prescribed by his PCP. He voiced understanding.  Chronic diastolic heart failure. This remained compensated during hospitalization. His echo 01/2016 revealed an EF of 60-65% and grade 2 diastolic dysfunction.  Tobacco abuse. The patient was advised to stop smoking. Nicotine patch was given and tobacco cessation counseling was ordered.    Procedures:  None  Consultations:  None  Discharge Exam: Filed Vitals:   03/21/16 0900 03/21/16 1000  BP: 80/66 94/54  Pulse: 74 78  Temp:    Resp: 16 20  Temperature 98.2. Oxygen saturation 97% on room air. General exam:  no acute distress.   Respiratory system: Clear to auscultation. Respiratory effort normal. Cardiovascular system: S1, S2, no murmurs rubs or gallops. No pedal edema. Gastrointestinal system: Abdomen is nondistended, soft and nontender. No organomegaly or masses felt. Normal bowel sounds heard. Central nervous system/neurologic: Alert and oriented 3. No focal neurological deficits. No asterixis. Extremities: No acute hot red joints. Skin: No rashes, lesions or ulcers Psychiatry: Judgement and insight appear normal. Mood & affect appropriate.   Discharge Instructions   Discharge Instructions    Diet - low sodium heart healthy    Complete by:  As directed      Diet Carb Modified    Complete by:  As directed      Discharge instructions    Complete by:  As directed   Your fluid medication regimen has been changed, noted on your new medication list. Check your blood sugars at least 2-3 times daily. Follow-up with your primary physician for further management. Dose of lactulose was increased.     Increase activity slowly    Complete by:  As directed           Current Discharge Medication List    CONTINUE these medications which have CHANGED   Details  furosemide (LASIX) 40 MG tablet Dose has been decreased to 1 tablet daily. You may need to restart 2 tablets daily, but this decision will be made by your primary physician or gastroenterologist or cardiologist.    gabapentin (NEURONTIN) 300  MG capsule Take 1 capsule (300 mg total) by mouth at bedtime.    lactulose (CHRONULAC) 10 GM/15ML solution Take 45 mLs (30 g total) by mouth 3 (three) times daily. Qty: 1892 mL, Refills: 3    metolazone (ZAROXOLYN) 2.5 MG tablet Take twice weekly only as needed for increase in leg swelling.      CONTINUE these medications which have NOT CHANGED   Details  Insulin Glargine (TOUJEO SOLOSTAR) 300 UNIT/ML SOPN Inject 40 Units into the skin daily. Qty: 3 pen, Refills: 1    insulin regular human CONCENTRATED (HUMULIN  R) 500 UNIT/ML SOLN injection Inject 60 Units into the skin 3 (three) times daily.     omeprazole (PRILOSEC) 40 MG capsule TAKE ONE CAPSULE BY MOUTH ONCE DAILY Qty: 30 capsule, Refills: 5    oxyCODONE (ROXICODONE) 5 MG immediate release tablet Take 1 tablet (5 mg total) by mouth every 4 (four) hours as needed for severe pain. Qty: 8 tablet, Refills: 0    potassium chloride 20 MEQ TBCR Take 20 mEq by mouth 2 (two) times daily. Qty: 10 tablet, Refills: 0    propranolol (INDERAL) 20 MG tablet TAKE ONE TABLET BY MOUTH TWICE DAILY Qty: 60 tablet, Refills: 3    spironolactone (ALDACTONE) 50 MG tablet Take 50 mg by mouth 2 (two) times daily.     triamcinolone cream (KENALOG) 0.1 % Apply 1 application topically 2 (two) times daily. Qty: 30 g, Refills: 0    XIFAXAN 550 MG TABS tablet TAKE ONE TABLET BY MOUTH TWICE DAILY Qty: 60 tablet, Refills: 3    ACCU-CHEK AVIVA PLUS test strip USE TEST STRIPS TO CHECK BLOOD SUGAR THREE TIMES DAILY AS DIRECTED Qty: 100 each, Refills: 11    Insulin Pen Needle 29G X 12.7MM MISC Use with Toujeo solostar Qty: 100 each, Refills: 11    Insulin Syringes, Disposable, (B-D INSULIN SYRINGE 1CC) U-100 1 ML MISC Inject 60 Units into the skin 3 (three) times daily.      STOP taking these medications     Suvorexant (BELSOMRA) 10 MG TABS        Allergies  Allergen Reactions  . Aspirin Other (See Comments)    Harms liver.   . Ibuprofen Other (See Comments)    Can't take per Dr Gala Romney- harms livers  . Tylenol [Acetaminophen] Other (See Comments)    Restless Legs    Follow-up Information    Follow up with [facility] TOM, MD On 03/24/2016.   Specialty:  Family Medicine   Why:  10:30 AM Monday   Contact information:   [address] [place] [address] [phone]        The results of significant diagnostics from this hospitalization (including imaging, microbiology, ancillary and laboratory) are listed below for reference.     Significant Diagnostic Studies: No results found.  Microbiology: Recent Results (from the past 240 hour(s))  MRSA PCR Screening     Status: None   Collection Time: 03/19/16  7:30 PM  Result Value Ref Range Status   MRSA by PCR NEGATIVE NEGATIVE Final    Comment:        The GeneXpert MRSA Assay (FDA approved for NASAL specimens only), is one component of a comprehensive MRSA colonization surveillance program. It is not intended to diagnose MRSA infection nor to guide or monitor treatment for MRSA infections.      Labs: Basic Metabolic Panel:  Recent Labs Lab 03/19/16 1619 03/19/16 1930 03/20/16 0203 03/20/16 0753 03/20/16 1059 03/21/16  0409  NA 127* 126* 133* 133* 131* 133*  K 3.9 3.8 3.1* 3.7 3.4* 4.0  CL 83* 83* 90* 91* 92* 96*  CO2 27 27 26 28 26 24   GLUCOSE 568* 567* 205* 153* 246* 289*  BUN 65* 65* 62* 57* 57* 48*  CREATININE 2.90* 2.98* 2.80* 2.63* 2.57* 2.16*  CALCIUM 9.6 9.5 9.2 9.4 9.0 9.3  MG 1.8  --   --   --   --   --    Liver Function Tests:  Recent Labs Lab 03/18/16 1449 03/19/16 1619 03/20/16 0201 03/21/16 0409  AST 48* 55* 52* 54*  ALT 31 35 33 31  ALKPHOS 106 127* 108 72  BILITOT 7.9* 7.4* 6.7* 6.4*  PROT 7.5 7.6 7.3 6.9  ALBUMIN 3.0* 2.7* 2.6* 2.5*   No results for input(s): LIPASE, AMYLASE in the last 168 hours.  Recent Labs Lab 03/19/16 1619 03/20/16 1059 03/21/16 0409  AMMONIA 114* 89* 107*   CBC:  Recent Labs Lab 03/18/16 1449 03/19/16 1619 03/20/16 0201 03/21/16 0409  WBC 6.6 7.2 8.0 6.9  NEUTROABS 4290 4.6  --   --   HGB 15.3 13.9 13.6 13.6  HCT 42.9 38.0* 37.1* 37.5*  MCV 99.5 96.7 96.1 97.9  PLT 65* 57* 55* 47*   Cardiac Enzymes: No results for input(s): CKTOTAL, CKMB, CKMBINDEX, TROPONINI in the last 168 hours. BNP: BNP (last 3 results)  Recent Labs  12/10/15 2113 12/27/15 1900  BNP 56.0 126.0*    ProBNP (last 3 results) No results for input(s): PROBNP in the last 8760 hours.  CBG:  Recent  Labs Lab 03/20/16 1514 03/20/16 1955 03/20/16 2357 03/21/16 0350 03/21/16 0732  GLUCAP 257* 344* 264* 273* 211*       Signed:  Aubriegh Minch MD.  [facility] 03/21/2016, 10:24 AM

## 2016-03-21 NOTE — Progress Notes (Signed)
Inpatient Diabetes Program Recommendations  AACE/ADA: New Consensus Statement on Inpatient Glycemic Control (2015)  Target Ranges:  Prepandial:   less than 140 mg/dL      Peak postprandial:   less than 180 mg/dL (1-2 hours)      Critically ill patients:  140 - 180 mg/dL  Results for Kyle [name], Kyle [name] (MRN [id number]) as of 03/21/2016 10:04  Ref. Range 03/20/2016 08:35 03/20/2016 09:34 03/20/2016 10:31 03/20/2016 11:25 03/20/2016 12:51 03/20/2016 14:07 03/20/2016 15:14 03/20/2016 19:55 03/20/2016 23:57 03/21/2016 03:50 03/21/2016 07:32  Glucose-Capillary Latest Ref Range: 65-99 mg/dL 181 (H) 207 (H) 215 (H) 202 (H) 291 (H) 260 (H) 257 (H) 344 (H) 264 (H) 273 (H) 211 (H)   Review of Glycemic Control  Diabetes history: DM2 Outpatient Diabetes medications: Toujeo 40 units daily, Humulin R U500 60 units TID with meals Current orders for Inpatient glycemic control: Lantus 40 units daily, Novolog 0-15 units Q4H, Novolog 10 units TID with meals for meal coverage  Inpatient Diabetes Program Recommendations: Insulin - Basal: Please consider increasing Lantus to 50 units daily (based on 113 kg x 0.45 units). Insulin - Meal Coverage: Noted Novolog 10 units TID with meals was started this morning.  Note: Patient was on an insulin drip but has been transitioned to SQ insulin. Patient received Lantus 40 units at 11:08 am on 03/20/16. Glucose has ranged from 211-344 mg/dl since Lantus was given and patient has received a total of Novolog 40 units for correction since Lantus was given yesterday.  Thanks, Barnie Alderman, RN, MSN, CDE Diabetes Coordinator Inpatient Diabetes Program [phone] (Team Pager from [place] to [place]) [phone] ([facility] office) [phone] [facility] office) [phone] [facility] office)

## 2016-03-21 NOTE — Progress Notes (Signed)
Discharge instruction reviewed with patient and patient's wife at bedside. Educated patient changes with current medications changed and primary care MD appointment for 03/24/16.

## 2016-03-24 ENCOUNTER — Encounter: Payer: Self-pay | Admitting: Family Medicine

## 2016-03-24 ENCOUNTER — Ambulatory Visit (INDEPENDENT_AMBULATORY_CARE_PROVIDER_SITE_OTHER): Payer: Medicaid Other | Admitting: Family Medicine

## 2016-03-24 VITALS — BP 90/60 | HR 80 | Temp 98.3°F | Resp 18 | Wt 260.0 lb

## 2016-03-24 DIAGNOSIS — Z09 Encounter for follow-up examination after completed treatment for conditions other than malignant neoplasm: Secondary | ICD-10-CM | POA: Diagnosis not present

## 2016-03-24 DIAGNOSIS — K7469 Other cirrhosis of liver: Secondary | ICD-10-CM

## 2016-03-24 DIAGNOSIS — E1140 Type 2 diabetes mellitus with diabetic neuropathy, unspecified: Secondary | ICD-10-CM | POA: Diagnosis not present

## 2016-03-24 DIAGNOSIS — N184 Chronic kidney disease, stage 4 (severe): Secondary | ICD-10-CM | POA: Diagnosis not present

## 2016-03-24 DIAGNOSIS — E1165 Type 2 diabetes mellitus with hyperglycemia: Secondary | ICD-10-CM | POA: Diagnosis not present

## 2016-03-24 DIAGNOSIS — G4710 Hypersomnia, unspecified: Secondary | ICD-10-CM | POA: Diagnosis not present

## 2016-03-24 LAB — COMPLETE METABOLIC PANEL WITH GFR
ALBUMIN: 2.7 g/dL — AB (ref 3.6–5.1)
ALT: 29 U/L (ref 9–46)
AST: 47 U/L — ABNORMAL HIGH (ref 10–35)
Alkaline Phosphatase: 109 U/L (ref 40–115)
BUN: 27 mg/dL — ABNORMAL HIGH (ref 7–25)
CALCIUM: 8.8 mg/dL (ref 8.6–10.3)
CO2: 26 mmol/L (ref 20–31)
Chloride: 96 mmol/L — ABNORMAL LOW (ref 98–110)
Creat: 1.72 mg/dL — ABNORMAL HIGH (ref 0.70–1.33)
GFR, EST NON AFRICAN AMERICAN: 43 mL/min — AB (ref >=60)
GFR, Est African American: 50 mL/min — ABNORMAL LOW (ref >=60)
Glucose, Bld: 226 mg/dL — ABNORMAL HIGH (ref 70–99)
POTASSIUM: 4 mmol/L (ref 3.5–5.3)
Sodium: 134 mmol/L — ABNORMAL LOW (ref 135–146)
Total Bilirubin: 5.8 mg/dL — ABNORMAL HIGH (ref 0.2–1.2)
Total Protein: 6.9 g/dL (ref 6.1–8.1)

## 2016-03-24 LAB — CBC WITH DIFFERENTIAL/PLATELET
BASOS ABS: 0 {cells}/uL (ref 0–200)
Basophils Relative: 0 %
Eosinophils Absolute: 180 cells/uL (ref 15–500)
Eosinophils Relative: 3 %
HEMATOCRIT: 38 % — AB (ref 38.5–50.0)
HEMOGLOBIN: 13.4 g/dL (ref 13.0–17.0)
LYMPHS ABS: 1860 {cells}/uL (ref 850–3900)
LYMPHS PCT: 31 %
MCH: 35.5 pg — AB (ref 27.0–33.0)
MCHC: 35.3 g/dL (ref 32.0–36.0)
MCV: 100.8 fL — AB (ref 80.0–100.0)
MONO ABS: 720 {cells}/uL (ref 200–950)
MPV: 9 fL (ref 7.5–12.5)
Monocytes Relative: 12 %
NEUTROS PCT: 54 %
Neutro Abs: 3240 cells/uL (ref 1500–7800)
Platelets: 47 10*3/uL — ABNORMAL LOW (ref 140–400)
RBC: 3.77 MIL/uL — AB (ref 4.20–5.80)
RDW: 15.7 % — AB (ref 11.0–15.0)
WBC: 6 10*3/uL (ref 3.8–10.8)

## 2016-03-24 NOTE — Progress Notes (Signed)
Subjective:    Patient ID: Kyle [name], male    DOB: [birth year], 60 y.o.   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 He is currently on lactulose once a day 1 tablespoon. He is also taking xifaxin.  Since February, he has lost substantial weight as evidenced below: Wt Readings from Last 3 Encounters:  03/20/16 250 lb (113.4 kg)  03/18/16 255 lb (115.667 kg)  02/04/16 275 lb (124.739 kg)   However this is with aggressive diuresis under the care of his gastroenterologist as well as his cardiologist. He has no pitting edema in his legs. There is no pulmonary edema on examination. However over the last several weeks he has become progressively lethargic, hypersomnolent, more confused, more forgetful. His wife also reports that he has more tremors. On examination today he demonstrates asterixis. He also has some mild scleral icterus. He is no longer taking pain medication or sleeping pills on a daily basis although he is taking gabapentin which could cause some hypersomnolence. He denies any symptoms of infection. He denies any fevers or chills nausea vomiting or diarrhea. He denies any dysuria or cough or shortness of breath.  At that time, my plan was: I believe his hypersomnolence is secondary to mild chronic hepatic encephalopathy which is slowly worsening. I recommended that they have waited all sleeping medication. I recommended that they have all pain medicine. I will amend of the face discontinue gabapentin or any other medication that can cause somnolence. I recommended that they increase lactulose to 30 mg 4 times a day. I will check a CBC, CMP, serum lactic acid, urinalysis, and ammonia level. If encephalopathy worsens, he needs to go directly to ER.     03/24/16 Lab work returned with a critically elevated blood sugar greater than 400 and evidence of DKA with anion gap of 17.  Referred to the emergency room where he was admitted to  stepdown unit and treated with insulin and IV fluids. Ironically his hypersomnolence had essentially resolved on the elevated dose of lactulose after just 24 hours and by withholding the gabapentin. He is here today for hospital follow-up. I have copied relevant portions of the discharge summary below and included them for my reference:  Admit date: 03/19/2016 Discharge date: 03/21/2016  Recommendations for Outpatient Follow-up:  1. Recommend follow-up of the patient's fluid status and electrolytes as the dose of Lasix was decreased and metolazone was decreased to when necessary only. 2. Recommend further outpatient management of diabetes. 3. Recommend follow-up ammonia level.    Discharge Diagnoses:   DKA, type 2 ([place])  Acute kidney injury ([place])  Esophageal varices ([place])  Decompensated liver disease ([place])  Hepatic encephalopathy syndrome (HCC)  Liver cirrhosis secondary to [name]  Hypokalemia  Chronic diastolic CHF (congestive heart failure) (HCC)  Malignant neoplasm of bladder (HCC)  Iron deficiency anemia due to chronic blood loss  SMOKER  Thrombocytopenia due to hypersplenism  Hyponatremia  Hyperbilirubinemia  CKD (chronic kidney disease), stage III   Discharge Condition: Improved.  Diet recommendation: Heart healthy/carbo hydrate modified.  Filed Weights   03/19/16 1543 03/19/16 1900 03/20/16 1900  Weight: 113.399 kg (250 lb) 113 kg (249 lb 1.9 oz) 113.4 kg (250 lb)    History of present illness:  Patient is a 60 year old man with [name] cirrhosis, uncontrolled type 2 diabetes mellitus, chronic thrombocytopenia secondary to cirrhosis, history of upper GI bleed secondary to esophageal varices, bladder cancer-status post TURBT 2, and iron deficiency anemia, who  was admitted to the hospital on 03/19/2016 for confusion, elevated blood glucose, and acute renal failure. In the ED patient's vitals were stable. Blood work showed WBC of 7.2, hemoglobin of  13.9, platelets of 57, sodium of 127, chloride of 83 with a gap of 17. BUN and creatinine were elevated to 65 and 2.90. Blood glucose was 568. Chest x-ray was unremarkable. UA sent. LFTs showed mildly elevated AST and total bili of 7.4. (Increased from 4.92 months ago).Ammonia level was 114. He was admitted for hepatic encephalopathy, DKA, and acute renal failure.  Hospital Course:  1. DKA and type 2 diabetes. Patient is treated chronically with Lantus and Humulin R. His blood glucose was 568 on admission and his anion gap was 17. He was started on vigorous IV fluids and the insulin drip Glucomander protocol. His electrolytes were monitored closely. Following treatment, his anion gap normalized. Insulin drip was discontinued and Lantus with sliding scale NovoLog were started. He was actually started on less insulin than he is prescribed at home, with his CBGs ranging from the 150s to the 280s. In light of his admitted noncompliance at times, his home dosing of insulin was not changed. Will defer further management to his PCP. -Hemoglobin A1c was ordered and was 11.0.  Acute kidney injury with stage II-3 chronic kidney disease; secondary to prerenal azotemia. Patient's BUN was 65 and creatinine was 2.90 on admission. His creatinine was 1.07 six months ago and 1.6 two months ago and 2.22 weeks ago.. -Per his account, he had been started on vigorous diuretic therapy for lower extremity edema over the past few months; presumably from cirrhosis. He is treated with Zaroxolyn, spironolactone, and Lasix. -He was started on vigorous IV fluids and his diuretics were withheld. There was no significant peripheral edema on exam. -His creatinine improved to 2.16, likely close to baseline prior to discharge. -At the time of discharge, he was instructed to decrease Lasix from 80 to 40 mg daily and to change metolazone to twice a week only if needed for increase in leg swelling and to maintain spironolactone as  previously prescribed. -Recommend follow-up of his fluid status and electrolytes.  Hyponatremia. Patient serum sodium was 127 on admission. His diuretics were held and he was started on vigorous IV fluids with normal saline. His serum sodium improved to 133. -Etiology likely secondary to DKA in the setting of volume depletion.  Hypokalemia. Patient serum potassium fell to 3.1 following correction of DKA. He was started on potassium chloride and given several IV runs of potassium. His serum potassium improved.  Karlene Lineman cirrhosis with resultant thrombocytopenia, hypersplenism, esophageal varices, and hyperbilirubinemia. Patient is followed by Dr. Gala Romney in [place] and [facility] transplant center. Apparently patient is on the transplant list. He is treated chronically with diuretics, propanolol, and lactulose and rifaximin. -He was continued on propanolol, lactulose, rifaximin. His PPI was also continued. -His platelet count appears to be at baseline, ranged from 55-65. -His bilirubin trended down from 7.4 to 6.7.  Hepatic encephalopathy. Apparently, the patient has some confusion in the outpatient setting. Outpatient laboratory studies per his PCP, Dr. Dennard Schaumann revealed an elevated ammonia level. The dose of lactulose was increased to 30 g- 4 times a day by his PCP, but the patient stated he had been taking 15 g 3 times daily. -Follow-up ammonia level improved to 89, but stabilized at 107. He had no signs of hepatic encephalopathy. -He was instructed to increase lactulose to 30 g 4 times daily as prescribed by his PCP. He  voiced understanding.  Chronic diastolic heart failure. This remained compensated during hospitalization. His echo 01/2016 revealed an EF of 60-65% and grade 2 diastolic dysfunction.  Tobacco abuse. The patient was advised to stop smoking. Nicotine patch was given and tobacco cessation counseling was ordered.        Today on follow-up, the patient states that he was taking  Toujeo 40 units everyday and humalog 60 TID AC.  He states that he was being compliant and therefore his hemoglobin A1c is more a reflection of an adequate insulin dosage rather than noncompliance. He states that his fasting blood sugars are typically 190 in that area and his postprandial sugars were usually 200-300. I believe that with the aggressive diuresis and dehydration his sugars worsened due to volume depletion.  I believe this coupled with his hepatic encephalopathy and his acute renal insufficiency led to his confusion and hypersomnolence. His wife states that since discharge from hospital he has been doing well. He is yet to take lactulose today and this morning he is very lethargic and sleepy. However she states that prior to this morning he has been doing fine.  Past Surgical History  Procedure Laterality Date  . Carpel tunnel Right   . Shoulder surgery Left   . Adrenal mass surgery  05/2009    benign, left  . Bladder surgery  01/2009 and 06/2010    cancer 2010, small recurrence in 06/2010  . Colonoscopy  04/2009    moderate int hemorrhoids, rare sigmoid diverticula, one mm sessile hyperplastic rectal polyp  . Esophagogastroduodenoscopy  03/2009    small hh  . Small bowel capsule endoscopy  03/2009    couple of small benign appearing erosions, nonbleeding  . Egd/tcs  08/2007    small hiatal hernia, pancolonic diverticula, friable anal canal, 3cm salmon colored epithelium in distal esophagus, bx negative for Barrett's  . Skin cancer excision  Oct 2012    left arm  . Esophagogastroduodenoscopy  11/25/2011    Dr. Phineas Douglas III varices in the mid and distal esophagus, banding placed, portal astropathy  . Esophagogastroduodenoscopy  01/14/2012    Dr Rourk->4-5 columns Gr2 varices, 6 bands placed, HH, distal esophageal ulcer, portal gastropathy, antral erosions  . Esophagogastroduodenoscopy  03/17/2012    Dr. Larina Bras varices status post band ligation. Hiatal hernia. Portal  gastropathy.  . Umbilical hernia repair  04/05/2012    Procedure: HERNIA REPAIR UMBILICAL ADULT;  Surgeon: Donato Heinz, MD;  Location: [facility];  Service: General;  Laterality: N/A;  . Esophagogastroduodenoscopy  04/11/2012    Dr. Bridgett Larsson ulcer possibly at previous banding sites with stigmata     of bleeding.  Attempt at hemostasis with hemoclip and banding was not successful resulting in recurrence of bleed/  Portal gastropathy, but no evidence of gastric varices/ Recurrent esophageal varices grade 2.  . Esophagogastroduodenoscopy  05/24/2012    Dr. Larina Bras varices, portal gastropathy  . Esophagogastroduodenoscopy  04/11/12    Dr. Drue Novel in the distal esophagus adherent lot and visible vessel treated with bicap. grade I varices in the distal esophagus, one ligating band placed, portal hypertensive gastropathy in the body of the stomach.  . Colonoscopy  09/29/2012    [id number] varices. Scattered pancolonic diverticula/single cecal polyp-removed as described above  . Esophagogastroduodenoscopy  12/24/2012    Dr. Lynwood Dawley significant upper GI bleed secondary to bleeding esophageal varices. s/p hemostasis therapy with injection of a total of 22mL of 5% ethanolamine and application of 9 bands, complete egd not carried out.  [name]  Schlerotherapy  12/24/2012    Procedure: Woodward Ku OF VARICES;  Surgeon: Daneil Dolin, MD;  Location: [facility];  Service: Endoscopy;  Laterality: N/A;  . Esophageal banding  12/24/2012    [id number] significant upper GI bleed secondary to bleeding esophageal varices  . Tips procedure  12/2012    [facility]  . Cataract extraction, bilateral    . Hernia repair      umbilical  . Eye surgery      bilateral cataract surgery with lens implants  . Transurethral resection of bladder tumor N/A 03/12/2015    Procedure: TRANSURETHRAL RESECTION OF BLADDER TUMOR (TURBT);  Surgeon: Raynelle Bring, MD;  Location: [facility];  Service: Urology;   Laterality: N/A;  . Cystoscopy w/ ureteral stent placement Bilateral 03/12/2015    Procedure: CYSTOSCOPY WITH BILATERAL RETROGRADE PYELOGRAM/RIGHT URETEROSCOPY;  Surgeon: Raynelle Bring, MD;  Location: [facility];  Service: Urology;  Laterality: Bilateral;  . Circumcision N/A 03/12/2015    Procedure: CIRCUMCISION ADULT;  Surgeon: Raynelle Bring, MD;  Location: [facility];  Service: Urology;  Laterality: N/A;   Current Outpatient Prescriptions on File Prior to Visit  Medication Sig Dispense Refill  . ACCU-CHEK AVIVA PLUS test strip USE TEST STRIPS TO CHECK BLOOD SUGAR THREE TIMES DAILY AS DIRECTED 100 each 11  . furosemide (LASIX) 40 MG tablet Dose has been decreased to 1 tablet daily. You may need to restart 2 tablets daily, but this decision will be made by your primary physician or gastroenterologist or cardiologist.    . gabapentin (NEURONTIN) 300 MG capsule Take 1 capsule (300 mg total) by mouth at bedtime.    . Insulin Glargine (TOUJEO SOLOSTAR) 300 UNIT/ML SOPN Inject 40 Units into the skin daily. 3 pen 1  . Insulin Pen Needle 29G X 12.7MM MISC Use with Toujeo solostar 100 each 11  . insulin regular human CONCENTRATED (HUMULIN R) 500 UNIT/ML SOLN injection Inject 60 Units into the skin 3 (three) times daily.     . Insulin Syringes, Disposable, (B-D INSULIN SYRINGE 1CC) U-100 1 ML MISC Inject 60 Units into the skin 3 (three) times daily.    [name] lactulose (CHRONULAC) 10 GM/15ML solution Take 45 mLs (30 g total) by mouth 3 (three) times daily. 1892 mL 3  . metolazone (ZAROXOLYN) 2.5 MG tablet Take twice weekly only as needed for increase in leg swelling.    [name] omeprazole (PRILOSEC) 40 MG capsule TAKE ONE CAPSULE BY MOUTH ONCE DAILY 30 capsule 5  . oxyCODONE (ROXICODONE) 5 MG immediate release tablet Take 1 tablet (5 mg total) by mouth every 4 (four) hours as needed for severe pain. 8 tablet 0  . potassium chloride 20 MEQ TBCR Take 20 mEq by mouth 2 (two) times daily. 10 tablet 0  . propranolol (INDERAL) 20  MG tablet TAKE ONE TABLET BY MOUTH TWICE DAILY 60 tablet 3  . spironolactone (ALDACTONE) 50 MG tablet Take 50 mg by mouth 2 (two) times daily.     [name] triamcinolone cream (KENALOG) 0.1 % Apply 1 application topically 2 (two) times daily. 30 g 0  . XIFAXAN 550 MG TABS tablet TAKE ONE TABLET BY MOUTH TWICE DAILY 60 tablet 3   No current facility-administered medications on file prior to visit.   Allergies  Allergen Reactions  . Aspirin Other (See Comments)    Harms liver.   . Ibuprofen Other (See Comments)    Can't take per Dr Gala Romney- harms livers  . Tylenol [Acetaminophen] Other (See Comments)    Restless Legs  Social History   Social History  . Marital Status: Married    Spouse Name: N/A  . Number of Children: 3  . Years of Education: N/A   Occupational History  . disabled    Social History Main Topics  . Smoking status: Current Every Day Smoker -- 1.00 packs/day for 30 years    Types: Cigarettes  . Smokeless tobacco: Never Used  . Alcohol Use: No     Comment: drank heavily for few years in 20s  . Drug Use: No  . Sexual Activity: Yes    Birth Control/ Protection: None   Other Topics Concern  . Not on file   Social History Narrative    Past Medical History  Diagnosis Date  . DM (diabetes mellitus) ([place])   . Cirrhosis ([place])     bx proven steatohepatitis with cirrhosis (2010); per note from Feb 2011, received Hep A and B vaccines in 2010.  [name] HTN (hypertension)   . RLS (restless legs syndrome)   . Hyperlipidemia   . IDA (iron deficiency anemia)   . GERD (gastroesophageal reflux disease)   . Depression   . Peripheral neuropathy ([place])   . Urothelial cancer ([place])     2010, paillary low-grade, h/o recurrence 2011, recurrence 02/2015  . B12 deficiency   . Psoriasis   . 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 Anemia due to multiple mechanisms 05/13/2011  . History of alcohol abuse 05/13/2011  . ANEMIA-IRON DEFICIENCY 03/09/2009  . S/P endoscopy May 2012     4 columns grade II esophageal varices; due for repeat in Nov 2013   . S/P colonoscopy May 2012    Tubular adenoma  . Low back pain     possible bulging disc, bone scan   . Cancer ([place])     bladder ca 05/2009 removal and  w/chemo wash  . Esophageal varices with bleeding(456.0) 11/24/11    s/p emergent EGD 11/25/11 by Dr. Hilarie Fredrickson at [facility], Grade III esophageal varices s/p banding X 5  . Small bowel obstruction ([place]) 02/15/2012    Admitted to [facility], managed by Dr. Geroge Baseman, ventral hernia manually reduced  . Ventral hernia 02/15/12  . MRSA (methicillin resistant Staphylococcus aureus)   . Acute post-ligation esophageal ulcer with hemorrhage 04/11/2012  . Clostridium difficile infection   . H. pylori infection 12/24/2012  . Sleep apnea     [facility] took him off CPAP when had tips procedure  . Sleep apnea   . Cellulitis 04/2014    right upper leg  . Iron deficiency anemia secondary to blood loss (chronic) 03/09/2009    Secondary to GI blood loss- Rectal and esophageal varices, portal gastropathy, and esophageal ulcers    . Bladder cancer ([place])   . Chronic diastolic CHF (congestive heart failure) ([place]) 03/21/2016    01/2016 echo: EF 60-65%; LVH; grade 2 diastolic dysfunction.  . CKD (chronic kidney disease), stage III 03/21/2016  . DKA, type 2 ([place]) 03/19/2016       Review of Systems  All other systems reviewed and are negative.      Objective:   Physical Exam  Eyes: Scleral icterus is present.  Neck: No JVD present. No thyromegaly present.  Cardiovascular: Normal rate, regular rhythm and normal heart sounds.   No murmur heard. Pulmonary/Chest: Effort normal and breath sounds normal. No respiratory distress. He has no wheezes. He has no rales.  Abdominal: Soft. Bowel sounds are normal. He exhibits distension. There is no tenderness. There is no rebound and no  guarding.  Musculoskeletal: He exhibits no edema.  Lymphadenopathy:    He has no cervical adenopathy.          Assessment & Plan:    Hospital discharge follow-up - Plan: COMPLETE METABOLIC PANEL WITH GFR, CBC with Differential/Platelet  Patient appears very lethargic. I recommended they leave immediately and go take a dose of lactulose. I would like him to continue the lactulose 4 times a day as this seems to be managing his hepatic encephalopathy at the present time. I want him to increase Toujeo to 60 untis qam and record fasting blood sugars and recheck with me on Friday. We will continue to increase Toujeo until fasting blood sugar falls below 130. Continue his current dose of Humalog at the present time. I will not adjust Humalog until his fasting blood sugars are below 130. I'll recheck his renal function today. Recheck on Friday

## 2016-03-25 LAB — AMMONIA

## 2016-03-28 ENCOUNTER — Ambulatory Visit: Payer: Medicaid Other | Admitting: Family Medicine

## 2016-03-31 ENCOUNTER — Ambulatory Visit (INDEPENDENT_AMBULATORY_CARE_PROVIDER_SITE_OTHER): Payer: Medicaid Other | Admitting: Family Medicine

## 2016-03-31 ENCOUNTER — Encounter: Payer: Self-pay | Admitting: Family Medicine

## 2016-03-31 VITALS — BP 106/62 | HR 80 | Temp 98.4°F | Resp 18 | Wt 261.0 lb

## 2016-03-31 DIAGNOSIS — K7469 Other cirrhosis of liver: Secondary | ICD-10-CM | POA: Diagnosis not present

## 2016-03-31 DIAGNOSIS — E1165 Type 2 diabetes mellitus with hyperglycemia: Secondary | ICD-10-CM

## 2016-03-31 DIAGNOSIS — E1140 Type 2 diabetes mellitus with diabetic neuropathy, unspecified: Secondary | ICD-10-CM

## 2016-03-31 DIAGNOSIS — G4710 Hypersomnia, unspecified: Secondary | ICD-10-CM

## 2016-03-31 NOTE — Progress Notes (Signed)
Subjective:    Patient ID: Kyle [name], male    DOB: 1956-08-05, 60 y.o.   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 He is currently on lactulose once a day 1 tablespoon. He is also taking xifaxin.  Since February, he has lost substantial weight as evidenced below: Wt Readings from Last 3 Encounters:  03/24/16 260 lb (117.935 kg)  03/20/16 250 lb (113.4 kg)  03/18/16 255 lb (115.667 kg)   However this is with aggressive diuresis under the care of his gastroenterologist as well as his cardiologist. He has no pitting edema in his legs. There is no pulmonary edema on examination. However over the last several weeks he has become progressively lethargic, hypersomnolent, more confused, more forgetful. His wife also reports that he has more tremors. On examination today he demonstrates asterixis. He also has some mild scleral icterus. He is no longer taking pain medication or sleeping pills on a daily basis although he is taking gabapentin which could cause some hypersomnolence. He denies any symptoms of infection. He denies any fevers or chills nausea vomiting or diarrhea. He denies any dysuria or cough or shortness of breath.  At that time, my plan was: I believe his hypersomnolence is secondary to mild chronic hepatic encephalopathy which is slowly worsening. I recommended that they have waited all sleeping medication. I recommended that they have all pain medicine. I will amend of the face discontinue gabapentin or any other medication that can cause somnolence. I recommended that they increase lactulose to 30 mg 4 times a day. I will check a CBC, CMP, serum lactic acid, urinalysis, and ammonia level. If encephalopathy worsens, he needs to go directly to ER.     03/24/16 Lab work returned with a critically elevated blood sugar greater than 400 and evidence of DKA with anion gap of 17.  Referred to the emergency room where he was admitted to  stepdown unit and treated with insulin and IV fluids. Ironically his hypersomnolence had essentially resolved on the elevated dose of lactulose after just 24 hours and by withholding the gabapentin. He is here today for hospital follow-up. I have copied relevant portions of the discharge summary below and included them for my reference:  Admit date: 03/19/2016 Discharge date: 03/21/2016  Recommendations for Outpatient Follow-up:  1. Recommend follow-up of the patient's fluid status and electrolytes as the dose of Lasix was decreased and metolazone was decreased to when necessary only. 2. Recommend further outpatient management of diabetes. 3. Recommend follow-up ammonia level.    Discharge Diagnoses:   DKA, type 2 ([place])  Acute kidney injury ([place])  Esophageal varices ([place])  Decompensated liver disease ([place])  Hepatic encephalopathy syndrome (HCC)  Liver cirrhosis secondary to [name]  Hypokalemia  Chronic diastolic CHF (congestive heart failure) (HCC)  Malignant neoplasm of bladder (HCC)  Iron deficiency anemia due to chronic blood loss  SMOKER  Thrombocytopenia due to hypersplenism  Hyponatremia  Hyperbilirubinemia  CKD (chronic kidney disease), stage III   Discharge Condition: Improved.  Diet recommendation: Heart healthy/carbo hydrate modified.  Filed Weights   03/19/16 1543 03/19/16 1900 03/20/16 1900  Weight: 113.399 kg (250 lb) 113 kg (249 lb 1.9 oz) 113.4 kg (250 lb)    History of present illness:  Patient is a 60 year old man with [name] cirrhosis, uncontrolled type 2 diabetes mellitus, chronic thrombocytopenia secondary to cirrhosis, history of upper GI bleed secondary to esophageal varices, bladder cancer-status post TURBT 2, and iron deficiency anemia, who  was admitted to the hospital on 03/19/2016 for confusion, elevated blood glucose, and acute renal failure. In the ED patient's vitals were stable. Blood work showed WBC of 7.2, hemoglobin of  13.9, platelets of 57, sodium of 127, chloride of 83 with a gap of 17. BUN and creatinine were elevated to 65 and 2.90. Blood glucose was 568. Chest x-ray was unremarkable. UA sent. LFTs showed mildly elevated AST and total bili of 7.4. (Increased from 4.92 months ago).Ammonia level was 114. He was admitted for hepatic encephalopathy, DKA, and acute renal failure.  Hospital Course:  1. DKA and type 2 diabetes. Patient is treated chronically with Lantus and Humulin R. His blood glucose was 568 on admission and his anion gap was 17. He was started on vigorous IV fluids and the insulin drip Glucomander protocol. His electrolytes were monitored closely. Following treatment, his anion gap normalized. Insulin drip was discontinued and Lantus with sliding scale NovoLog were started. He was actually started on less insulin than he is prescribed at home, with his CBGs ranging from the 150s to the 280s. In light of his admitted noncompliance at times, his home dosing of insulin was not changed. Will defer further management to his PCP. -Hemoglobin A1c was ordered and was 11.0.  Acute kidney injury with stage II-3 chronic kidney disease; secondary to prerenal azotemia. Patient's BUN was 65 and creatinine was 2.90 on admission. His creatinine was 1.07 six months ago and 1.6 two months ago and 2.22 weeks ago.. -Per his account, he had been started on vigorous diuretic therapy for lower extremity edema over the past few months; presumably from cirrhosis. He is treated with Zaroxolyn, spironolactone, and Lasix. -He was started on vigorous IV fluids and his diuretics were withheld. There was no significant peripheral edema on exam. -His creatinine improved to 2.16, likely close to baseline prior to discharge. -At the time of discharge, he was instructed to decrease Lasix from 80 to 40 mg daily and to change metolazone to twice a week only if needed for increase in leg swelling and to maintain spironolactone as  previously prescribed. -Recommend follow-up of his fluid status and electrolytes.  Hyponatremia. Patient serum sodium was 127 on admission. His diuretics were held and he was started on vigorous IV fluids with normal saline. His serum sodium improved to 133. -Etiology likely secondary to DKA in the setting of volume depletion.  Hypokalemia. Patient serum potassium fell to 3.1 following correction of DKA. He was started on potassium chloride and given several IV runs of potassium. His serum potassium improved.  Karlene Lineman cirrhosis with resultant thrombocytopenia, hypersplenism, esophageal varices, and hyperbilirubinemia. Patient is followed by Dr. Gala Romney in [place] and [facility] transplant center. Apparently patient is on the transplant list. He is treated chronically with diuretics, propanolol, and lactulose and rifaximin. -He was continued on propanolol, lactulose, rifaximin. His PPI was also continued. -His platelet count appears to be at baseline, ranged from 55-65. -His bilirubin trended down from 7.4 to 6.7.  Hepatic encephalopathy. Apparently, the patient has some confusion in the outpatient setting. Outpatient laboratory studies per his PCP, Dr. Dennard Schaumann revealed an elevated ammonia level. The dose of lactulose was increased to 30 g- 4 times a day by his PCP, but the patient stated he had been taking 15 g 3 times daily. -Follow-up ammonia level improved to 89, but stabilized at 107. He had no signs of hepatic encephalopathy. -He was instructed to increase lactulose to 30 g 4 times daily as prescribed by his PCP. He  voiced understanding.  Chronic diastolic heart failure. This remained compensated during hospitalization. His echo 01/2016 revealed an EF of 60-65% and grade 2 diastolic dysfunction.  Tobacco abuse. The patient was advised to stop smoking. Nicotine patch was given and tobacco cessation counseling was ordered.       03/24/16 Today on follow-up, the patient states that he was  taking Toujeo 40 units everyday and humalog 60 TID AC.  He states that he was being compliant and therefore his hemoglobin A1c is more a reflection of an adequate insulin dosage rather than noncompliance. He states that his fasting blood sugars are typically 190 in that area and his postprandial sugars were usually 200-300. I believe that with the aggressive diuresis and dehydration his sugars worsened due to volume depletion.  I believe this coupled with his hepatic encephalopathy and his acute renal insufficiency led to his confusion and hypersomnolence. His wife states that since discharge from hospital he has been doing well. He is yet to take lactulose today and this morning he is very lethargic and sleepy. However she states that prior to this morning he has been doing fine.  At that time, my plan was: Patient appears very lethargic. I recommended they leave immediately and go take a dose of lactulose. I would like him to continue the lactulose 4 times a day as this seems to be managing his hepatic encephalopathy at the present time. I want him to increase Toujeo to 60 untis qam and record fasting blood sugars and recheck with me on Friday. We will continue to increase Toujeo until fasting blood sugar falls below 130. Continue his current dose of Humalog at the present time. I will not adjust Humalog until his fasting blood sugars are below 130. I'll recheck his renal function today. Recheck on Friday  03/31/16  Friday, the patient had a blood sugar falls to the 30s. His wife had to call EMS as the patient lost consciousness and fell striking his right anterior ribs. He is now very tender in that area. There is no bruising however he has significant tenderness to palpation over the right anterior ribs just below his nipple. He denies any cough or hemoptysis. However that blood sugar seems to be an aberration. The majority of his blood sugars are between 100- 250. Past Surgical History  Procedure  Laterality Date  . Carpel tunnel Right   . Shoulder surgery Left   . Adrenal mass surgery  05/2009    benign, left  . Bladder surgery  01/2009 and 06/2010    cancer 2010, small recurrence in 06/2010  . Colonoscopy  04/2009    moderate int hemorrhoids, rare sigmoid diverticula, one mm sessile hyperplastic rectal polyp  . Esophagogastroduodenoscopy  03/2009    small hh  . Small bowel capsule endoscopy  03/2009    couple of small benign appearing erosions, nonbleeding  . Egd/tcs  08/2007    small hiatal hernia, pancolonic diverticula, friable anal canal, 3cm salmon colored epithelium in distal esophagus, bx negative for Barrett's  . Skin cancer excision  Oct 2012    left arm  . Esophagogastroduodenoscopy  11/25/2011    Dr. Phineas Douglas III varices in the mid and distal esophagus, banding placed, portal astropathy  . Esophagogastroduodenoscopy  01/14/2012    Dr Rourk->4-5 columns Gr2 varices, 6 bands placed, HH, distal esophageal ulcer, portal gastropathy, antral erosions  . Esophagogastroduodenoscopy  03/17/2012    Dr. Larina Bras varices status post band ligation. Hiatal hernia. Portal gastropathy.  . Umbilical hernia  repair  04/05/2012    Procedure: HERNIA REPAIR UMBILICAL ADULT;  Surgeon: Donato Heinz, MD;  Location: [facility];  Service: General;  Laterality: N/A;  . Esophagogastroduodenoscopy  04/11/2012    Dr. Bridgett Larsson ulcer possibly at previous banding sites with stigmata     of bleeding.  Attempt at hemostasis with hemoclip and banding was not successful resulting in recurrence of bleed/  Portal gastropathy, but no evidence of gastric varices/ Recurrent esophageal varices grade 2.  . Esophagogastroduodenoscopy  05/24/2012    Dr. Larina Bras varices, portal gastropathy  . Esophagogastroduodenoscopy  04/11/12    Dr. Drue Novel in the distal esophagus adherent lot and visible vessel treated with bicap. grade I varices in the distal esophagus, one ligating band placed, portal  hypertensive gastropathy in the body of the stomach.  . Colonoscopy  09/29/2012    [id number] varices. Scattered pancolonic diverticula/single cecal polyp-removed as described above  . Esophagogastroduodenoscopy  12/24/2012    Dr. Lynwood Dawley significant upper GI bleed secondary to bleeding esophageal varices. s/p hemostasis therapy with injection of a total of 47mL of 5% ethanolamine and application of 9 bands, complete egd not carried out.  . Schlerotherapy  12/24/2012    Procedure: Woodward Ku OF VARICES;  Surgeon: Daneil Dolin, MD;  Location: [facility];  Service: Endoscopy;  Laterality: N/A;  . Esophageal banding  12/24/2012    [id number] significant upper GI bleed secondary to bleeding esophageal varices  . Tips procedure  12/2012    [facility]  . Cataract extraction, bilateral    . Hernia repair      umbilical  . Eye surgery      bilateral cataract surgery with lens implants  . Transurethral resection of bladder tumor N/A 03/12/2015    Procedure: TRANSURETHRAL RESECTION OF BLADDER TUMOR (TURBT);  Surgeon: Raynelle Bring, MD;  Location: [facility];  Service: Urology;  Laterality: N/A;  . Cystoscopy w/ ureteral stent placement Bilateral 03/12/2015    Procedure: CYSTOSCOPY WITH BILATERAL RETROGRADE PYELOGRAM/RIGHT URETEROSCOPY;  Surgeon: Raynelle Bring, MD;  Location: [facility];  Service: Urology;  Laterality: Bilateral;  . Circumcision N/A 03/12/2015    Procedure: CIRCUMCISION ADULT;  Surgeon: Raynelle Bring, MD;  Location: [facility];  Service: Urology;  Laterality: N/A;   Current Outpatient Prescriptions on File Prior to Visit  Medication Sig Dispense Refill  . ACCU-CHEK AVIVA PLUS test strip USE TEST STRIPS TO CHECK BLOOD SUGAR THREE TIMES DAILY AS DIRECTED 100 each 11  . furosemide (LASIX) 40 MG tablet Dose has been decreased to 1 tablet daily. You may need to restart 2 tablets daily, but this decision will be made by your primary physician or gastroenterologist or cardiologist.    .  gabapentin (NEURONTIN) 300 MG capsule Take 1 capsule (300 mg total) by mouth at bedtime.    . Insulin Glargine (TOUJEO SOLOSTAR) 300 UNIT/ML SOPN Inject 40 Units into the skin daily. 3 pen 1  . Insulin Pen Needle 29G X 12.7MM MISC Use with Toujeo solostar 100 each 11  . insulin regular human CONCENTRATED (HUMULIN R) 500 UNIT/ML SOLN injection Inject 60 Units into the skin 3 (three) times daily.     . Insulin Syringes, Disposable, (B-D INSULIN SYRINGE 1CC) U-100 1 ML MISC Inject 60 Units into the skin 3 (three) times daily.    [name] lactulose (CHRONULAC) 10 GM/15ML solution Take 45 mLs (30 g total) by mouth 3 (three) times daily. 1892 mL 3  . metolazone (ZAROXOLYN) 2.5 MG tablet Take twice weekly only as needed for increase in  leg swelling.    [name] omeprazole (PRILOSEC) 40 MG capsule TAKE ONE CAPSULE BY MOUTH ONCE DAILY 30 capsule 5  . oxyCODONE (ROXICODONE) 5 MG immediate release tablet Take 1 tablet (5 mg total) by mouth every 4 (four) hours as needed for severe pain. 8 tablet 0  . potassium chloride 20 MEQ TBCR Take 20 mEq by mouth 2 (two) times daily. 10 tablet 0  . propranolol (INDERAL) 20 MG tablet TAKE ONE TABLET BY MOUTH TWICE DAILY 60 tablet 3  . spironolactone (ALDACTONE) 50 MG tablet Take 50 mg by mouth 2 (two) times daily.     [name] triamcinolone cream (KENALOG) 0.1 % Apply 1 application topically 2 (two) times daily. 30 g 0  . XIFAXAN 550 MG TABS tablet TAKE ONE TABLET BY MOUTH TWICE DAILY 60 tablet 3   No current facility-administered medications on file prior to visit.   Allergies  Allergen Reactions  . Aspirin Other (See Comments)    Harms liver.   . Ibuprofen Other (See Comments)    Can't take per Dr Gala Romney- harms livers  . Tylenol [Acetaminophen] Other (See Comments)    Restless Legs    Social History   Social History  . Marital Status: Married    Spouse Name: N/A  . Number of Children: 3  . Years of Education: N/A   Occupational History  . disabled    Social History Main  Topics  . Smoking status: Current Every Day Smoker -- 1.00 packs/day for 30 years    Types: Cigarettes  . Smokeless tobacco: Never Used  . Alcohol Use: No     Comment: drank heavily for few years in 20s  . Drug Use: No  . Sexual Activity: Yes    Birth Control/ Protection: None   Other Topics Concern  . Not on file   Social History Narrative    Past Medical History  Diagnosis Date  . DM (diabetes mellitus) ([place])   . Cirrhosis ([place])     bx proven steatohepatitis with cirrhosis (2010); per note from Feb 2011, received Hep A and B vaccines in 2010.  [name] HTN (hypertension)   . RLS (restless legs syndrome)   . Hyperlipidemia   . IDA (iron deficiency anemia)   . GERD (gastroesophageal reflux disease)   . Depression   . Peripheral neuropathy ([place])   . Urothelial cancer ([place])     2010, paillary low-grade, h/o recurrence 2011, recurrence 02/2015  . B12 deficiency   . Psoriasis   . 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 Anemia due to multiple mechanisms 05/13/2011  . History of alcohol abuse 05/13/2011  . ANEMIA-IRON DEFICIENCY 03/09/2009  . S/P endoscopy May 2012    4 columns grade II esophageal varices; due for repeat in Nov 2013   . S/P colonoscopy May 2012    Tubular adenoma  . Low back pain     possible bulging disc, bone scan   . Cancer ([place])     bladder ca 05/2009 removal and  w/chemo wash  . Esophageal varices with bleeding(456.0) 11/24/11    s/p emergent EGD 11/25/11 by Dr. Hilarie Fredrickson at [facility], Grade III esophageal varices s/p banding X 5  . Small bowel obstruction ([place]) 02/15/2012    Admitted to [facility], managed by Dr. Geroge Baseman, ventral hernia manually reduced  . Ventral hernia 02/15/12  . MRSA (methicillin resistant Staphylococcus aureus)   . Acute post-ligation esophageal ulcer with hemorrhage 04/11/2012  . Clostridium difficile infection   . H. pylori infection 12/24/2012  .  Sleep apnea     [facility] took him off CPAP when had tips procedure  . Sleep apnea   . Cellulitis  04/2014    right upper leg  . Iron deficiency anemia secondary to blood loss (chronic) 03/09/2009    Secondary to GI blood loss- Rectal and esophageal varices, portal gastropathy, and esophageal ulcers    . Bladder cancer ([place])   . Chronic diastolic CHF (congestive heart failure) ([place]) 03/21/2016    01/2016 echo: EF 60-65%; LVH; grade 2 diastolic dysfunction.  . CKD (chronic kidney disease), stage III 03/21/2016  . DKA, type 2 ([place]) 03/19/2016       Review of Systems  All other systems reviewed and are negative.      Objective:   Physical Exam  Eyes: Scleral icterus is present.  Neck: No JVD present. No thyromegaly present.  Cardiovascular: Normal rate, regular rhythm and normal heart sounds.   No murmur heard. Pulmonary/Chest: Effort normal and breath sounds normal. No respiratory distress. He has no wheezes. He has no rales.  Abdominal: Soft. Bowel sounds are normal. He exhibits distension. There is no tenderness. There is no rebound and no guarding.  Musculoskeletal: He exhibits no edema.  Lymphadenopathy:    He has no cervical adenopathy.          Assessment & Plan:  Hypersomnolence  Other cirrhosis of liver ([place])  Uncontrolled type 2 diabetes mellitus with diabetic neuropathy, unspecified long term insulin use status ([place])  I believe the patient became confused and administered too much insulin or missed a meal or both causing his hypoglycemic reaction. The remainder of his sugars are actually elevated. Therefore I will make no changes in his insulin at the present time. I have asked him to check fasting blood sugars and two-hour postprandial sugars over the next week and bring the values in for me to review. I want to see if the hypoglycemic episode is an aberration or if it is occurring more frequently. I will ask his wife to administer his insulin to avoid confusion

## 2016-04-02 ENCOUNTER — Telehealth: Payer: Self-pay | Admitting: Gastroenterology

## 2016-04-02 NOTE — Telephone Encounter (Signed)
Please NIC for abd u/s for hepatoma screening in 06/2016

## 2016-04-03 NOTE — Telephone Encounter (Signed)
APPT MADE AND LETTER SENT  °

## 2016-04-03 NOTE — Telephone Encounter (Signed)
Kyle [name], please nic 

## 2016-04-16 ENCOUNTER — Other Ambulatory Visit: Payer: Self-pay | Admitting: Family Medicine

## 2016-04-16 ENCOUNTER — Telehealth: Payer: Self-pay | Admitting: Family Medicine

## 2016-04-16 MED ORDER — INSULIN REGULAR HUMAN (CONC) 500 UNIT/ML ~~LOC~~ SOLN: 60.0000 [IU] | Freq: Three times a day (TID) | SUBCUTANEOUS | Status: DC

## 2016-04-16 MED ORDER — INSULIN SYRINGES (DISPOSABLE) U-100 1 ML MISC: 60.0000 [IU] | Freq: Three times a day (TID) | Status: AC

## 2016-04-16 NOTE — Telephone Encounter (Signed)
Medication called/sent to requested pharmacy  

## 2016-04-16 NOTE — Telephone Encounter (Signed)
Patient called in requesting refill on his insulin regular human CONCENTRATED (HUMULIN R) 500 UNIT/ML SOLN injection  He uses [facility] in [place]  CB# [phone]

## 2016-04-22 ENCOUNTER — Telehealth: Payer: Self-pay | Admitting: Family Medicine

## 2016-04-22 NOTE — Telephone Encounter (Signed)
Pt called and appt made for Friday

## 2016-04-22 NOTE — Telephone Encounter (Signed)
Pt calling because pharmacy will not refill his Humulin R insulin 500u/ml strength.  They fear he will not dose himself correctly as he has syringes titrated for 100u/ml insulin.  Feel pens would be better.  Pharmacist also says pt has not been to see Endocrinologist (apparently they called him about this) in over a year.   He agreed that someone needs to see and talk to pt to make sure he is administering this correctly.  Per pharmacy discussion with patient feel he may be giving himself too much.  Please advise.

## 2016-04-22 NOTE — Telephone Encounter (Signed)
ntbs to discuss.  Given his encephalopathy recently, I feel he could get confused and give too much which could seriously hurt him.  He could certainly get the pens instead of the syringes because the pens will do all the math/dose adjustment for him.  He could dial up the dose using the pen even though it is more concentrated.

## 2016-04-25 ENCOUNTER — Ambulatory Visit (INDEPENDENT_AMBULATORY_CARE_PROVIDER_SITE_OTHER): Payer: Medicaid Other | Admitting: Family Medicine

## 2016-04-25 ENCOUNTER — Encounter: Payer: Self-pay | Admitting: Family Medicine

## 2016-04-25 VITALS — BP 112/64 | HR 80 | Temp 97.8°F | Resp 20 | Wt 254.0 lb

## 2016-04-25 DIAGNOSIS — E1165 Type 2 diabetes mellitus with hyperglycemia: Secondary | ICD-10-CM

## 2016-04-25 DIAGNOSIS — K3184 Gastroparesis: Secondary | ICD-10-CM | POA: Diagnosis not present

## 2016-04-25 DIAGNOSIS — E1140 Type 2 diabetes mellitus with diabetic neuropathy, unspecified: Secondary | ICD-10-CM | POA: Diagnosis not present

## 2016-04-25 MED ORDER — INSULIN REGULAR HUMAN (CONC) 500 UNIT/ML ~~LOC~~ SOLN: 300.0000 [IU] | Freq: Three times a day (TID) | SUBCUTANEOUS | Status: AC

## 2016-04-25 MED ORDER — METOCLOPRAMIDE HCL 10 MG PO TABS: 10.0000 mg | ORAL_TABLET | Freq: Three times a day (TID) | ORAL | Status: AC

## 2016-04-25 NOTE — Progress Notes (Signed)
Subjective:    Patient ID: Kyle [name], male    DOB: 03-15-1956, 60 y.o.   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 He is currently on lactulose once a day 1 tablespoon. He is also taking xifaxin.  Since February, he has lost substantial weight as evidenced below: Wt Readings from Last 3 Encounters:  03/31/16 261 lb (118.389 kg)  03/24/16 260 lb (117.935 kg)  03/20/16 250 lb (113.4 kg)   However this is with aggressive diuresis under the care of his gastroenterologist as well as his cardiologist. He has no pitting edema in his legs. There is no pulmonary edema on examination. However over the last several weeks he has become progressively lethargic, hypersomnolent, more confused, more forgetful. His wife also reports that he has more tremors. On examination today he demonstrates asterixis. He also has some mild scleral icterus. He is no longer taking pain medication or sleeping pills on a daily basis although he is taking gabapentin which could cause some hypersomnolence. He denies any symptoms of infection. He denies any fevers or chills nausea vomiting or diarrhea. He denies any dysuria or cough or shortness of breath.  At that time, my plan was: I believe his hypersomnolence is secondary to mild chronic hepatic encephalopathy which is slowly worsening. I recommended that they have waited all sleeping medication. I recommended that they have all pain medicine. I will amend of the face discontinue gabapentin or any other medication that can cause somnolence. I recommended that they increase lactulose to 30 mg 4 times a day. I will check a CBC, CMP, serum lactic acid, urinalysis, and ammonia level. If encephalopathy worsens, he needs to go directly to ER.     03/24/16 Lab work returned with a critically elevated blood sugar greater than 400 and evidence of DKA with anion gap of 17.  Referred to the emergency room where he was admitted to  stepdown unit and treated with insulin and IV fluids. Ironically his hypersomnolence had essentially resolved on the elevated dose of lactulose after just 24 hours and by withholding the gabapentin. He is here today for hospital follow-up. I have copied relevant portions of the discharge summary below and included them for my reference:  Admit date: 03/19/2016 Discharge date: 03/21/2016  Recommendations for Outpatient Follow-up:  1. Recommend follow-up of the patient's fluid status and electrolytes as the dose of Lasix was decreased and metolazone was decreased to when necessary only. 2. Recommend further outpatient management of diabetes. 3. Recommend follow-up ammonia level.    Discharge Diagnoses:   DKA, type 2 ([place])  Acute kidney injury ([place])  Esophageal varices ([place])  Decompensated liver disease ([place])  Hepatic encephalopathy syndrome (HCC)  Liver cirrhosis secondary to [name]  Hypokalemia  Chronic diastolic CHF (congestive heart failure) (HCC)  Malignant neoplasm of bladder (HCC)  Iron deficiency anemia due to chronic blood loss  SMOKER  Thrombocytopenia due to hypersplenism  Hyponatremia  Hyperbilirubinemia  CKD (chronic kidney disease), stage III   Discharge Condition: Improved.  Diet recommendation: Heart healthy/carbo hydrate modified.  Filed Weights   03/19/16 1543 03/19/16 1900 03/20/16 1900  Weight: 113.399 kg (250 lb) 113 kg (249 lb 1.9 oz) 113.4 kg (250 lb)    History of present illness:  Patient is a 60 year old man with [name] cirrhosis, uncontrolled type 2 diabetes mellitus, chronic thrombocytopenia secondary to cirrhosis, history of upper GI bleed secondary to esophageal varices, bladder cancer-status post TURBT 2, and iron deficiency anemia, who  was admitted to the hospital on 03/19/2016 for confusion, elevated blood glucose, and acute renal failure. In the ED patient's vitals were stable. Blood work showed WBC of 7.2, hemoglobin of  13.9, platelets of 57, sodium of 127, chloride of 83 with a gap of 17. BUN and creatinine were elevated to 65 and 2.90. Blood glucose was 568. Chest x-ray was unremarkable. UA sent. LFTs showed mildly elevated AST and total bili of 7.4. (Increased from 4.92 months ago).Ammonia level was 114. He was admitted for hepatic encephalopathy, DKA, and acute renal failure.  Hospital Course:  1. DKA and type 2 diabetes. Patient is treated chronically with Lantus and Humulin R. His blood glucose was 568 on admission and his anion gap was 17. He was started on vigorous IV fluids and the insulin drip Glucomander protocol. His electrolytes were monitored closely. Following treatment, his anion gap normalized. Insulin drip was discontinued and Lantus with sliding scale NovoLog were started. He was actually started on less insulin than he is prescribed at home, with his CBGs ranging from the 150s to the 280s. In light of his admitted noncompliance at times, his home dosing of insulin was not changed. Will defer further management to his PCP. -Hemoglobin A1c was ordered and was 11.0.  Acute kidney injury with stage II-3 chronic kidney disease; secondary to prerenal azotemia. Patient's BUN was 65 and creatinine was 2.90 on admission. His creatinine was 1.07 six months ago and 1.6 two months ago and 2.22 weeks ago.. -Per his account, he had been started on vigorous diuretic therapy for lower extremity edema over the past few months; presumably from cirrhosis. He is treated with Zaroxolyn, spironolactone, and Lasix. -He was started on vigorous IV fluids and his diuretics were withheld. There was no significant peripheral edema on exam. -His creatinine improved to 2.16, likely close to baseline prior to discharge. -At the time of discharge, he was instructed to decrease Lasix from 80 to 40 mg daily and to change metolazone to twice a week only if needed for increase in leg swelling and to maintain spironolactone as  previously prescribed. -Recommend follow-up of his fluid status and electrolytes.  Hyponatremia. Patient serum sodium was 127 on admission. His diuretics were held and he was started on vigorous IV fluids with normal saline. His serum sodium improved to 133. -Etiology likely secondary to DKA in the setting of volume depletion.  Hypokalemia. Patient serum potassium fell to 3.1 following correction of DKA. He was started on potassium chloride and given several IV runs of potassium. His serum potassium improved.  Karlene Lineman cirrhosis with resultant thrombocytopenia, hypersplenism, esophageal varices, and hyperbilirubinemia. Patient is followed by Dr. Gala Romney in [place] and [facility] transplant center. Apparently patient is on the transplant list. He is treated chronically with diuretics, propanolol, and lactulose and rifaximin. -He was continued on propanolol, lactulose, rifaximin. His PPI was also continued. -His platelet count appears to be at baseline, ranged from 55-65. -His bilirubin trended down from 7.4 to 6.7.  Hepatic encephalopathy. Apparently, the patient has some confusion in the outpatient setting. Outpatient laboratory studies per his PCP, Dr. Dennard Schaumann revealed an elevated ammonia level. The dose of lactulose was increased to 30 g- 4 times a day by his PCP, but the patient stated he had been taking 15 g 3 times daily. -Follow-up ammonia level improved to 89, but stabilized at 107. He had no signs of hepatic encephalopathy. -He was instructed to increase lactulose to 30 g 4 times daily as prescribed by his PCP. He  voiced understanding.  Chronic diastolic heart failure. This remained compensated during hospitalization. His echo 01/2016 revealed an EF of 60-65% and grade 2 diastolic dysfunction.  Tobacco abuse. The patient was advised to stop smoking. Nicotine patch was given and tobacco cessation counseling was ordered.       03/24/16 Today on follow-up, the patient states that he was  taking Toujeo 40 units everyday and humalog 60 TID AC.  He states that he was being compliant and therefore his hemoglobin A1c is more a reflection of an adequate insulin dosage rather than noncompliance. He states that his fasting blood sugars are typically 190 in that area and his postprandial sugars were usually 200-300. I believe that with the aggressive diuresis and dehydration his sugars worsened due to volume depletion.  I believe this coupled with his hepatic encephalopathy and his acute renal insufficiency led to his confusion and hypersomnolence. His wife states that since discharge from hospital he has been doing well. He is yet to take lactulose today and this morning he is very lethargic and sleepy. However she states that prior to this morning he has been doing fine.  At that time, my plan was: Patient appears very lethargic. I recommended they leave immediately and go take a dose of lactulose. I would like him to continue the lactulose 4 times a day as this seems to be managing his hepatic encephalopathy at the present time. I want him to increase Toujeo to 60 untis qam and record fasting blood sugars and recheck with me on Friday. We will continue to increase Toujeo until fasting blood sugar falls below 130. Continue his current dose of Humalog at the present time. I will not adjust Humalog until his fasting blood sugars are below 130. I'll recheck his renal function today. Recheck on Friday  03/31/16  Friday, the patient had a blood sugar falls to the 30s. His wife had to call EMS as the patient lost consciousness and fell striking his right anterior ribs. He is now very tender in that area. There is no bruising however he has significant tenderness to palpation over the right anterior ribs just below his nipple. He denies any cough or hemoptysis. However that blood sugar seems to be an aberration. The majority of his blood sugars are between 100- 250.  At that time, my plan was: I believe the  patient became confused and administered too much insulin or missed a meal or both causing his hypoglycemic reaction. The remainder of his sugars are actually elevated. Therefore I will make no changes in his insulin at the present time. I have asked him to check fasting blood sugars and two-hour postprandial sugars over the next week and bring the values in for me to review. I want to see if the hypoglycemic episode is an aberration or if it is occurring more frequently. I will ask his wife to administer his insulin to avoid confusion  04/25/16 Never provided the requested sugars to titrate his insulin further.  Instead received a call:  Pt calling because pharmacy will not refill his Humulin R insulin 500u/ml strength. They fear he will not dose himself correctly as he has syringes titrated for 100u/ml insulin. Feel pens would be better. Pharmacist also says pt has not been to see Endocrinologist (apparently they called him about this) in over a year. He agreed that someone needs to see and talk to pt to make sure he is administering this correctly. Per pharmacy discussion with patient feel he may be  giving himself too much. Please advise.  Sugars are all over the map.  They range anywhere from 120-430. Patient states that he is unable to eat. He becomes quickly nauseated as soon as he eats. He is throwing up immediately after he eats. He is still having 2-3 bowel movements a day. He denies any fevers or chills. He denies any blood in his stools. However he has been unable to eat and therefore the amount of insulin he takes varies from day-to-day. He is essentially guessing at the dose of insulin he needs. He is correct he is taking 60 units according to his needle however his insulin is actually the concentrated Humulin R 500 units per mL. Essentially he is taking 300 units 3 times a day. I was unaware of this obviously this 60 units of Toujeo is a drop in the bucket and likely has no impact on his  sugars. Patient is very lethargic and confused and somnolent. He has a difficult time answering questions. It is obvious that his hepatic encephalopathy is waxing and waning and this appears to be his new baseline even on the lactulose. He is no longer taking gabapentin or any pain medication. On examination today he has asterixis.  He appears slightly dehydrated. Wt Readings from Last 3 Encounters:  04/25/16 254 lb (115.214 kg)  03/31/16 261 lb (118.389 kg)  03/24/16 260 lb (117.935 kg)   He continues to lose weight likely due to decreased oral intake Past Surgical History  Procedure Laterality Date  . Carpel tunnel Right   . Shoulder surgery Left   . Adrenal mass surgery  05/2009    benign, left  . Bladder surgery  01/2009 and 06/2010    cancer 2010, small recurrence in 06/2010  . Colonoscopy  04/2009    moderate int hemorrhoids, rare sigmoid diverticula, one mm sessile hyperplastic rectal polyp  . Esophagogastroduodenoscopy  03/2009    small hh  . Small bowel capsule endoscopy  03/2009    couple of small benign appearing erosions, nonbleeding  . Egd/tcs  08/2007    small hiatal hernia, pancolonic diverticula, friable anal canal, 3cm salmon colored epithelium in distal esophagus, bx negative for Barrett's  . Skin cancer excision  Oct 2012    left arm  . Esophagogastroduodenoscopy  11/25/2011    Dr. Phineas Douglas III varices in the mid and distal esophagus, banding placed, portal astropathy  . Esophagogastroduodenoscopy  01/14/2012    Dr Rourk->4-5 columns Gr2 varices, 6 bands placed, HH, distal esophageal ulcer, portal gastropathy, antral erosions  . Esophagogastroduodenoscopy  03/17/2012    Dr. Larina Bras varices status post band ligation. Hiatal hernia. Portal gastropathy.  . Umbilical hernia repair  04/05/2012    Procedure: HERNIA REPAIR UMBILICAL ADULT;  Surgeon: Donato Heinz, MD;  Location: [facility];  Service: General;  Laterality: N/A;  . Esophagogastroduodenoscopy  04/11/2012     Dr. Bridgett Larsson ulcer possibly at previous banding sites with stigmata     of bleeding.  Attempt at hemostasis with hemoclip and banding was not successful resulting in recurrence of bleed/  Portal gastropathy, but no evidence of gastric varices/ Recurrent esophageal varices grade 2.  . Esophagogastroduodenoscopy  05/24/2012    Dr. Larina Bras varices, portal gastropathy  . Esophagogastroduodenoscopy  04/11/12    Dr. Drue Novel in the distal esophagus adherent lot and visible vessel treated with bicap. grade I varices in the distal esophagus, one ligating band placed, portal hypertensive gastropathy in the body of the stomach.  . Colonoscopy  09/29/2012  [id number] varices. Scattered pancolonic diverticula/single cecal polyp-removed as described above  . Esophagogastroduodenoscopy  12/24/2012    Dr. Lynwood Dawley significant upper GI bleed secondary to bleeding esophageal varices. s/p hemostasis therapy with injection of a total of 38mL of 5% ethanolamine and application of 9 bands, complete egd not carried out.  . Schlerotherapy  12/24/2012    Procedure: Woodward Ku OF VARICES;  Surgeon: Daneil Dolin, MD;  Location: [facility];  Service: Endoscopy;  Laterality: N/A;  . Esophageal banding  12/24/2012    [id number] significant upper GI bleed secondary to bleeding esophageal varices  . Tips procedure  12/2012    [facility]  . Cataract extraction, bilateral    . Hernia repair      umbilical  . Eye surgery      bilateral cataract surgery with lens implants  . Transurethral resection of bladder tumor N/A 03/12/2015    Procedure: TRANSURETHRAL RESECTION OF BLADDER TUMOR (TURBT);  Surgeon: Raynelle Bring, MD;  Location: [facility];  Service: Urology;  Laterality: N/A;  . Cystoscopy w/ ureteral stent placement Bilateral 03/12/2015    Procedure: CYSTOSCOPY WITH BILATERAL RETROGRADE PYELOGRAM/RIGHT URETEROSCOPY;  Surgeon: Raynelle Bring, MD;  Location: [facility];  Service: Urology;   Laterality: Bilateral;  . Circumcision N/A 03/12/2015    Procedure: CIRCUMCISION ADULT;  Surgeon: Raynelle Bring, MD;  Location: [facility];  Service: Urology;  Laterality: N/A;   Current Outpatient Prescriptions on File Prior to Visit  Medication Sig Dispense Refill  . ACCU-CHEK AVIVA PLUS test strip USE TEST STRIPS TO CHECK BLOOD SUGAR THREE TIMES DAILY AS DIRECTED 100 each 11  . furosemide (LASIX) 40 MG tablet Dose has been decreased to 1 tablet daily. You may need to restart 2 tablets daily, but this decision will be made by your primary physician or gastroenterologist or cardiologist.    . gabapentin (NEURONTIN) 300 MG capsule Take 1 capsule (300 mg total) by mouth at bedtime.    . Insulin Glargine (TOUJEO SOLOSTAR) 300 UNIT/ML SOPN Inject 40 Units into the skin daily. 3 pen 1  . Insulin Pen Needle 29G X 12.7MM MISC Use with Toujeo solostar 100 each 11  . insulin regular human CONCENTRATED (HUMULIN R) 500 UNIT/ML injection Inject 0.12 mLs (60 Units total) into the skin 3 (three) times daily. 20 mL 11  . Insulin Syringes, Disposable, (B-D INSULIN SYRINGE 1CC) U-100 1 ML MISC Inject 60 Units into the skin 3 (three) times daily. 500 each 11  . lactulose (CHRONULAC) 10 GM/15ML solution Take 45 mLs (30 g total) by mouth 3 (three) times daily. 1892 mL 3  . metolazone (ZAROXOLYN) 2.5 MG tablet Take twice weekly only as needed for increase in leg swelling.    [name] omeprazole (PRILOSEC) 40 MG capsule TAKE ONE CAPSULE BY MOUTH ONCE DAILY 30 capsule 5  . oxyCODONE (ROXICODONE) 5 MG immediate release tablet Take 1 tablet (5 mg total) by mouth every 4 (four) hours as needed for severe pain. (Patient not taking: Reported on 03/31/2016) 8 tablet 0  . potassium chloride 20 MEQ TBCR Take 20 mEq by mouth 2 (two) times daily. 10 tablet 0  . propranolol (INDERAL) 20 MG tablet TAKE ONE TABLET BY MOUTH TWICE DAILY 60 tablet 3  . spironolactone (ALDACTONE) 50 MG tablet Take 50 mg by mouth 2 (two) times daily.     .  Suvorexant (BELSOMRA) 10 MG TABS Take by mouth.    . triamcinolone cream (KENALOG) 0.1 % Apply 1 application topically 2 (two) times daily. (Patient not taking:  Reported on 03/31/2016) 30 g 0  . XIFAXAN 550 MG TABS tablet TAKE ONE TABLET BY MOUTH TWICE DAILY 60 tablet 3   No current facility-administered medications on file prior to visit.   Allergies  Allergen Reactions  . Aspirin Other (See Comments)    Harms liver.   . Ibuprofen Other (See Comments)    Can't take per Dr Gala Romney- harms livers  . Tylenol [Acetaminophen] Other (See Comments)    Restless Legs    Social History   Social History  . Marital Status: Married    Spouse Name: N/A  . Number of Children: 3  . Years of Education: N/A   Occupational History  . disabled    Social History Main Topics  . Smoking status: Current Every Day Smoker -- 1.00 packs/day for 30 years    Types: Cigarettes  . Smokeless tobacco: Never Used  . Alcohol Use: No     Comment: drank heavily for few years in 20s  . Drug Use: No  . Sexual Activity: Yes    Birth Control/ Protection: None   Other Topics Concern  . Not on file   Social History Narrative    Past Medical History  Diagnosis Date  . DM (diabetes mellitus) ([place])   . Cirrhosis ([place])     bx proven steatohepatitis with cirrhosis (2010); per note from Feb 2011, received Hep A and B vaccines in 2010.  [name] HTN (hypertension)   . RLS (restless legs syndrome)   . Hyperlipidemia   . IDA (iron deficiency anemia)   . GERD (gastroesophageal reflux disease)   . Depression   . Peripheral neuropathy ([place])   . Urothelial cancer ([place])     2010, paillary low-grade, h/o recurrence 2011, recurrence 02/2015  . B12 deficiency   . Psoriasis   . 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 Anemia due to multiple mechanisms 05/13/2011  . History of alcohol abuse 05/13/2011  . ANEMIA-IRON DEFICIENCY 03/09/2009  . S/P endoscopy May 2012    4 columns grade II esophageal varices; due for repeat  in Nov 2013   . S/P colonoscopy May 2012    Tubular adenoma  . Low back pain     possible bulging disc, bone scan   . Cancer ([place])     bladder ca 05/2009 removal and  w/chemo wash  . Esophageal varices with bleeding(456.0) 11/24/11    s/p emergent EGD 11/25/11 by Dr. Hilarie Fredrickson at [facility], Grade III esophageal varices s/p banding X 5  . Small bowel obstruction ([place]) 02/15/2012    Admitted to [facility], managed by Dr. Geroge Baseman, ventral hernia manually reduced  . Ventral hernia 02/15/12  . MRSA (methicillin resistant Staphylococcus aureus)   . Acute post-ligation esophageal ulcer with hemorrhage 04/11/2012  . Clostridium difficile infection   . H. pylori infection 12/24/2012  . Sleep apnea     [facility] took him off CPAP when had tips procedure  . Sleep apnea   . Cellulitis 04/2014    right upper leg  . Iron deficiency anemia secondary to blood loss (chronic) 03/09/2009    Secondary to GI blood loss- Rectal and esophageal varices, portal gastropathy, and esophageal ulcers    . Bladder cancer ([place])   . Chronic diastolic CHF (congestive heart failure) ([place]) 03/21/2016    01/2016 echo: EF 60-65%; LVH; grade 2 diastolic dysfunction.  . CKD (chronic kidney disease), stage III 03/21/2016  . DKA, type 2 ([place]) 03/19/2016       Review of Systems  All  other systems reviewed and are negative.      Objective:   Physical Exam  Eyes: Scleral icterus is present.  Neck: No JVD present. No thyromegaly present.  Cardiovascular: Normal rate, regular rhythm and normal heart sounds.   No murmur heard. Pulmonary/Chest: Effort normal and breath sounds normal. No respiratory distress. He has no wheezes. He has no rales.  Abdominal: Soft. Bowel sounds are normal. He exhibits distension. There is no tenderness. There is no rebound and no guarding.  Musculoskeletal: He exhibits no edema.  Lymphadenopathy:    He has no cervical adenopathy.          Assessment & Plan:  Uncontrolled type 2 diabetes mellitus with diabetic  neuropathy, unspecified long term insulin use status (HCC)  Gastroparesis  I will make no attempt at trying to control his sugars at the present time. His sugars are too highly variable and he runs the risk of hypoglycemia. I need to regulate his oral intake and get some level of consistency in what he is taking in. Some days he doesn't even eat some days he is able to eat 3 small meals. I believe he likely has gastroparesis secondary to his uncontrolled diabetes mellitus. Therefore I'm going to add Reglan 10 mg 3 times a day prior to meals. Continue his current dose of insulin and recheck in one week. Hopefully if he is eating better and better hydrated week and then try to titrate his insulin to achieve blood sugars between 100-200 with some level of consistency.

## 2016-05-01 ENCOUNTER — Ambulatory Visit (INDEPENDENT_AMBULATORY_CARE_PROVIDER_SITE_OTHER): Payer: Medicaid Other | Admitting: Family Medicine

## 2016-05-01 ENCOUNTER — Encounter: Payer: Self-pay | Admitting: Family Medicine

## 2016-05-01 VITALS — BP 124/78 | HR 82 | Temp 97.9°F | Resp 18 | Ht 65.0 in | Wt 262.0 lb

## 2016-05-01 DIAGNOSIS — E1140 Type 2 diabetes mellitus with diabetic neuropathy, unspecified: Secondary | ICD-10-CM | POA: Diagnosis not present

## 2016-05-01 DIAGNOSIS — K3184 Gastroparesis: Secondary | ICD-10-CM

## 2016-05-01 DIAGNOSIS — E1165 Type 2 diabetes mellitus with hyperglycemia: Secondary | ICD-10-CM | POA: Diagnosis not present

## 2016-05-01 NOTE — Progress Notes (Signed)
Subjective:    Patient ID: Kyle [name], male    DOB: 1956-05-14, 60 y.o.   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 He is currently on lactulose once a day 1 tablespoon. He is also taking xifaxin.  Since February, he has lost substantial weight.  However this is with aggressive diuresis under the care of his gastroenterologist as well as his cardiologist. He has no pitting edema in his legs. There is no pulmonary edema on examination. However over the last several weeks he has become progressively lethargic, hypersomnolent, more confused, more forgetful. His wife also reports that he has more tremors. On examination today he demonstrates asterixis. He also has some mild scleral icterus. He is no longer taking pain medication or sleeping pills on a daily basis although he is taking gabapentin which could cause some hypersomnolence. He denies any symptoms of infection. He denies any fevers or chills nausea vomiting or diarrhea. He denies any dysuria or cough or shortness of breath.  At that time, my plan was: I believe his hypersomnolence is secondary to mild chronic hepatic encephalopathy which is slowly worsening. I recommended that they have waited all sleeping medication. I recommended that they have all pain medicine. I will amend of the face discontinue gabapentin or any other medication that can cause somnolence. I recommended that they increase lactulose to 30 mg 4 times a day. I will check a CBC, CMP, serum lactic acid, urinalysis, and ammonia level. If encephalopathy worsens, he needs to go directly to ER.     03/24/16 Lab work returned with a critically elevated blood sugar greater than 400 and evidence of DKA with anion gap of 17.  Referred to the emergency room where he was admitted to stepdown unit and treated with insulin and IV fluids. Ironically his hypersomnolence had essentially resolved on the elevated dose of lactulose after  just 24 hours and by withholding the gabapentin. He is here today for hospital follow-up. I have copied relevant portions of the discharge summary below and included them for my reference:  Admit date: 03/19/2016 Discharge date: 03/21/2016  Recommendations for Outpatient Follow-up:  1. Recommend follow-up of the patient's fluid status and electrolytes as the dose of Lasix was decreased and metolazone was decreased to when necessary only. 2. Recommend further outpatient management of diabetes. 3. Recommend follow-up ammonia level.    Discharge Diagnoses:   DKA, type 2 ([place])  Acute kidney injury ([place])  Esophageal varices ([place])  Decompensated liver disease ([place])  Hepatic encephalopathy syndrome (HCC)  Liver cirrhosis secondary to [name]  Hypokalemia  Chronic diastolic CHF (congestive heart failure) (HCC)  Malignant neoplasm of bladder (HCC)  Iron deficiency anemia due to chronic blood loss  SMOKER  Thrombocytopenia due to hypersplenism  Hyponatremia  Hyperbilirubinemia  CKD (chronic kidney disease), stage III   Discharge Condition: Improved.  Diet recommendation: Heart healthy/carbo hydrate modified.  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 In the ED patient's vitals were stable. Blood work showed WBC of  7.2, hemoglobin of 13.9, platelets of 57, sodium of 127, chloride of 83 with a gap of 17. BUN and creatinine were elevated to 65 and 2.90. Blood glucose was 568. Chest  x-ray was unremarkable. UA sent. LFTs showed mildly elevated AST and total bili of 7.4. (Increased from 4.92 months ago).Ammonia level was 114. He was admitted for hepatic encephalopathy, DKA, and acute renal failure.  Hospital Course:  1. DKA and type 2 diabetes. Patient is treated chronically with Lantus and Humulin R. His blood glucose was 568 on admission and his anion gap was 17. He was started on vigorous IV fluids and the insulin drip Glucomander protocol. His electrolytes were monitored closely. Following treatment, his anion gap normalized. Insulin drip was discontinued and Lantus with sliding scale NovoLog were started. He was actually started on less insulin than he is prescribed at home, with his CBGs ranging from the 150s to the 280s. In light of his admitted noncompliance at times, his home dosing of insulin was not changed. Will defer further management to his PCP. -Hemoglobin A1c was ordered and was 11.0.  Acute kidney injury with stage II-3 chronic kidney disease; secondary to prerenal azotemia. Patient's BUN was 65 and creatinine was 2.90 on admission. His creatinine was 1.07 six months ago and 1.6 two months ago and 2.22 weeks ago.. -Per his account, he had been started on vigorous diuretic therapy for lower extremity edema over the past few months; presumably from cirrhosis. He is treated with Zaroxolyn, spironolactone, and Lasix. -He was started on vigorous IV fluids and his diuretics were withheld. There was no significant peripheral edema on exam. -His creatinine improved to 2.16, likely close to baseline prior to discharge. -At the time of discharge, he was instructed to decrease Lasix from 80 to 40 mg daily and to change metolazone to twice a week only if needed for increase in leg swelling and to maintain spironolactone as previously prescribed. -Recommend follow-up of his fluid status and electrolytes.  Hyponatremia. Patient serum sodium was 127 on admission. His  diuretics were held and he was started on vigorous IV fluids with normal saline. His serum sodium improved to 133. -Etiology likely secondary to DKA in the setting of volume depletion.  Hypokalemia. Patient serum potassium fell to 3.1 following correction of DKA. He was started on potassium chloride and given several IV runs of potassium. His serum potassium improved.  Karlene Lineman cirrhosis with resultant thrombocytopenia, hypersplenism, esophageal varices, and hyperbilirubinemia. Patient is followed by Dr. Gala Romney in [place] and [facility] transplant center. Apparently patient is on the transplant list. He is treated chronically with diuretics, propanolol, and lactulose and rifaximin. -He was continued on propanolol, lactulose, rifaximin. His PPI was also continued. -His platelet count appears to be at baseline, ranged from 55-65. -His bilirubin trended down from 7.4 to 6.7.  Hepatic encephalopathy. Apparently, the patient has some confusion in the outpatient setting. Outpatient laboratory studies per his PCP, Dr. Dennard Schaumann revealed an elevated ammonia level. The dose of lactulose was increased to 30 g- 4 times a day by his PCP, but the patient stated he had been taking 15 g 3 times daily. -Follow-up ammonia level improved to 89, but stabilized at 107. He had no signs of hepatic encephalopathy. -He was instructed to increase lactulose to 30 g 4 times daily as prescribed by his PCP. He voiced understanding.  Chronic diastolic heart failure. This remained compensated during hospitalization. His echo 01/2016 revealed an EF of 60-65% and grade 2 diastolic dysfunction.  Tobacco abuse.  The patient was advised to stop smoking. Nicotine patch was given and tobacco cessation counseling was ordered.       03/24/16 Today on follow-up, the patient states that he was taking Toujeo 40 units everyday and humalog 60 TID AC.  He states that he was being compliant and therefore his hemoglobin A1c is more a reflection of an  adequate insulin dosage rather than noncompliance. He states that his fasting blood sugars are typically 190 in that area and his postprandial sugars were usually 200-300. I believe that with the aggressive diuresis and dehydration his sugars worsened due to volume depletion.  I believe this coupled with his hepatic encephalopathy and his acute renal insufficiency led to his confusion and hypersomnolence. His wife states that since discharge from hospital he has been doing well. He is yet to take lactulose today and this morning he is very lethargic and sleepy. However she states that prior to this morning he has been doing fine.  At that time, my plan was: Patient appears very lethargic. I recommended they leave immediately and go take a dose of lactulose. I would like him to continue the lactulose 4 times a day as this seems to be managing his hepatic encephalopathy at the present time. I want him to increase Toujeo to 60 untis qam and record fasting blood sugars and recheck with me on Friday. We will continue to increase Toujeo until fasting blood sugar falls below 130. Continue his current dose of Humalog at the present time. I will not adjust Humalog until his fasting blood sugars are below 130. I'll recheck his renal function today. Recheck on Friday  03/31/16  Friday, the patient had a blood sugar falls to the 30s. His wife had to call EMS as the patient lost consciousness and fell striking his right anterior ribs. He is now very tender in that area. There is no bruising however he has significant tenderness to palpation over the right anterior ribs just below his nipple. He denies any cough or hemoptysis. However that blood sugar seems to be an aberration. The majority of his blood sugars are between 100- 250.  At that time, my plan was: I believe the patient became confused and administered too much insulin or missed a meal or both causing his hypoglycemic reaction. The remainder of his sugars are  actually elevated. Therefore I will make no changes in his insulin at the present time. I have asked him to check fasting blood sugars and two-hour postprandial sugars over the next week and bring the values in for me to review. I want to see if the hypoglycemic episode is an aberration or if it is occurring more frequently. I will ask his wife to administer his insulin to avoid confusion  04/25/16 Never provided the requested sugars to titrate his insulin further.  Instead received a call:  Pt calling because pharmacy will not refill his Humulin R insulin 500u/ml strength. They fear he will not dose himself correctly as he has syringes titrated for 100u/ml insulin. Feel pens would be better. Pharmacist also says pt has not been to see Endocrinologist (apparently they called him about this) in over a year. He agreed that someone needs to see and talk to pt to make sure he is administering this correctly. Per pharmacy discussion with patient feel he may be giving himself too much. Please advise.  Sugars are all over the map.  They range anywhere from 120-430. Patient states that he is unable to eat. He  becomes quickly nauseated as soon as he eats. He is throwing up immediately after he eats. He is still having 2-3 bowel movements a day. He denies any fevers or chills. He denies any blood in his stools. However he has been unable to eat and therefore the amount of insulin he takes varies from day-to-day. He is essentially guessing at the dose of insulin he needs. He is correct that he is taking 60 units according to his needle however his insulin is actually the concentrated Humulin R 500 units per mL. Essentially he is taking 300 units 3 times a day. I was unaware of this obviously this 60 units of Toujeo is a drop in the bucket and likely has no impact on his sugars. Patient is very lethargic and confused and somnolent. He has a difficult time answering questions. It is obvious that his hepatic  encephalopathy is waxing and waning and this appears to be his new baseline even on the lactulose. He is no longer taking gabapentin or any pain medication. On examination today he has asterixis.  He appears slightly dehydrated.  He continues to lose weight likely due to decreased oral intake.     At that time, my plan was:  I will make no attempt at trying to control his sugars at the present time. His sugars are too highly variable and he runs the risk of hypoglycemia. I need to regulate his oral intake and get some level of consistency in what he is taking in. Some days he doesn't even eat, some days he is able to eat 3 small meals. I believe he likely has gastroparesis secondary to his uncontrolled diabetes mellitus. Therefore I'm going to add Reglan 10 mg 3 times a day prior to meals. Continue his current dose of insulin and recheck in one week. Hopefully if he is eating better and better hydrated week and then try to titrate his insulin to achieve blood sugars between 100-200 with some level of consistency.  05/01/16 The patient looks and feels much better after starting the Reglan. He is now eating better. He is less dehydrated. He is more alert and less confused. Unfortunately his sugar a complete mess. Blood sugars range from 50-500. He is taking 300 units 3 times a day of Humalog. At times this is too much and at other times it is likely insufficient. He is on an inadequate amount of basal insulin Past Surgical History  Procedure Laterality Date  . Carpel tunnel Right   . Shoulder surgery Left   . Adrenal mass surgery  05/2009    benign, left  . Bladder surgery  01/2009 and 06/2010    cancer 2010, small recurrence in 06/2010  . Colonoscopy  04/2009    moderate int hemorrhoids, rare sigmoid diverticula, one mm sessile hyperplastic rectal polyp  . Esophagogastroduodenoscopy  03/2009    small hh  . Small bowel capsule endoscopy  03/2009    couple of small benign appearing erosions, nonbleeding  .  Egd/tcs  08/2007    small hiatal hernia, pancolonic diverticula, friable anal canal, 3cm salmon colored epithelium in distal esophagus, bx negative for Barrett's  . Skin cancer excision  Oct 2012    left arm  . Esophagogastroduodenoscopy  11/25/2011    Dr. Phineas Douglas III varices in the mid and distal esophagus, banding placed, portal astropathy  . Esophagogastroduodenoscopy  01/14/2012    Dr Rourk->4-5 columns Gr2 varices, 6 bands placed, HH, distal esophageal ulcer, portal gastropathy, antral erosions  . Esophagogastroduodenoscopy  03/17/2012    Dr. Larina Bras varices status post band ligation. Hiatal hernia. Portal gastropathy.  . Umbilical hernia repair  04/05/2012    Procedure: HERNIA REPAIR UMBILICAL ADULT;  Surgeon: Donato Heinz, MD;  Location: [facility];  Service: General;  Laterality: N/A;  . Esophagogastroduodenoscopy  04/11/2012    Dr. Bridgett Larsson ulcer possibly at previous banding sites with stigmata     of bleeding.  Attempt at hemostasis with hemoclip and banding was not successful resulting in recurrence of bleed/  Portal gastropathy, but no evidence of gastric varices/ Recurrent esophageal varices grade 2.  . Esophagogastroduodenoscopy  05/24/2012    Dr. Larina Bras varices, portal gastropathy  . Esophagogastroduodenoscopy  04/11/12    Dr. Drue Novel in the distal esophagus adherent lot and visible vessel treated with bicap. grade I varices in the distal esophagus, one ligating band placed, portal hypertensive gastropathy in the body of the stomach.  . Colonoscopy  09/29/2012    [id number] varices. Scattered pancolonic diverticula/single cecal polyp-removed as described above  . Esophagogastroduodenoscopy  12/24/2012    Dr. Lynwood Dawley significant upper GI bleed secondary to bleeding esophageal varices. s/p hemostasis therapy with injection of a total of 68mL of 5% ethanolamine and application of 9 bands, complete egd not carried out.  . Schlerotherapy   12/24/2012    Procedure: Woodward Ku OF VARICES;  Surgeon: Daneil Dolin, MD;  Location: [facility];  Service: Endoscopy;  Laterality: N/A;  . Esophageal banding  12/24/2012    [id number] significant upper GI bleed secondary to bleeding esophageal varices  . Tips procedure  12/2012    [facility]  . Cataract extraction, bilateral    . Hernia repair      umbilical  . Eye surgery      bilateral cataract surgery with lens implants  . Transurethral resection of bladder tumor N/A 03/12/2015    Procedure: TRANSURETHRAL RESECTION OF BLADDER TUMOR (TURBT);  Surgeon: Raynelle Bring, MD;  Location: [facility];  Service: Urology;  Laterality: N/A;  . Cystoscopy w/ ureteral stent placement Bilateral 03/12/2015    Procedure: CYSTOSCOPY WITH BILATERAL RETROGRADE PYELOGRAM/RIGHT URETEROSCOPY;  Surgeon: Raynelle Bring, MD;  Location: [facility];  Service: Urology;  Laterality: Bilateral;  . Circumcision N/A 03/12/2015    Procedure: CIRCUMCISION ADULT;  Surgeon: Raynelle Bring, MD;  Location: [facility];  Service: Urology;  Laterality: N/A;   Current Outpatient Prescriptions on File Prior to Visit  Medication Sig Dispense Refill  . ACCU-CHEK AVIVA PLUS test strip USE TEST STRIPS TO CHECK BLOOD SUGAR THREE TIMES DAILY AS DIRECTED 100 each 11  . furosemide (LASIX) 40 MG tablet Dose has been decreased to 1 tablet daily. You may need to restart 2 tablets daily, but this decision will be made by your primary physician or gastroenterologist or cardiologist.    . gabapentin (NEURONTIN) 300 MG capsule Take 1 capsule (300 mg total) by mouth at bedtime. (Patient not taking: Reported on 04/25/2016)    . Insulin Glargine (TOUJEO SOLOSTAR) 300 UNIT/ML SOPN Inject 40 Units into the skin daily. 3 pen 1  . Insulin Pen Needle 29G X 12.7MM MISC Use with Toujeo solostar 100 each 11  . insulin regular human CONCENTRATED (HUMULIN R) 500 UNIT/ML injection Inject 0.6 mLs (300 Units total) into the skin 3 (three) times daily. 20 mL 11  . Insulin  Syringes, Disposable, (B-D INSULIN SYRINGE 1CC) U-100 1 ML MISC Inject 60 Units into the skin 3 (three) times daily. 500 each 11  . lactulose (CHRONULAC) 10 GM/15ML solution Take 45 mLs (  30 g total) by mouth 3 (three) times daily. 1892 mL 3  . metoCLOPramide (REGLAN) 10 MG tablet Take 1 tablet (10 mg total) by mouth 3 (three) times daily before meals. 90 tablet 0  . metolazone (ZAROXOLYN) 2.5 MG tablet Take twice weekly only as needed for increase in leg swelling.    [name] omeprazole (PRILOSEC) 40 MG capsule TAKE ONE CAPSULE BY MOUTH ONCE DAILY 30 capsule 5  . oxyCODONE (ROXICODONE) 5 MG immediate release tablet Take 1 tablet (5 mg total) by mouth every 4 (four) hours as needed for severe pain. 8 tablet 0  . potassium chloride 20 MEQ TBCR Take 20 mEq by mouth 2 (two) times daily. 10 tablet 0  . propranolol (INDERAL) 20 MG tablet TAKE ONE TABLET BY MOUTH TWICE DAILY 60 tablet 3  . spironolactone (ALDACTONE) 50 MG tablet Take 50 mg by mouth 2 (two) times daily.     . Suvorexant (BELSOMRA) 10 MG TABS Take by mouth.    . triamcinolone cream (KENALOG) 0.1 % Apply 1 application topically 2 (two) times daily. 30 g 0  . XIFAXAN 550 MG TABS tablet TAKE ONE TABLET BY MOUTH TWICE DAILY 60 tablet 3   No current facility-administered medications on file prior to visit.   Allergies  Allergen Reactions  . Aspirin Other (See Comments)    Harms liver.   . Ibuprofen Other (See Comments)    Can't take per Dr Gala Romney- harms livers  . Tylenol [Acetaminophen] Other (See Comments)    Restless Legs    Social History   Social History  . Marital Status: Married    Spouse Name: N/A  . Number of Children: 3  . Years of Education: N/A   Occupational History  . disabled    Social History Main Topics  . Smoking status: Current Every Day Smoker -- 1.00 packs/day for 30 years    Types: Cigarettes  . Smokeless tobacco: Never Used  . Alcohol Use: No     Comment: drank heavily for few years in 20s  . Drug Use: No  .  Sexual Activity: Yes    Birth Control/ Protection: None   Other Topics Concern  . Not on file   Social History Narrative    Past Medical History  Diagnosis Date  . DM (diabetes mellitus) ([place])   . Cirrhosis ([place])     bx proven steatohepatitis with cirrhosis (2010); per note from Feb 2011, received Hep A and B vaccines in 2010.  [name] HTN (hypertension)   . RLS (restless legs syndrome)   . Hyperlipidemia   . IDA (iron deficiency anemia)   . GERD (gastroesophageal reflux disease)   . Depression   . Peripheral neuropathy ([place])   . Urothelial cancer ([place])     2010, paillary low-grade, h/o recurrence 2011, recurrence 02/2015  . B12 deficiency   . Psoriasis   . 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 Anemia due to multiple mechanisms 05/13/2011  . History of alcohol abuse 05/13/2011  . ANEMIA-IRON DEFICIENCY 03/09/2009  . S/P endoscopy May 2012    4 columns grade II esophageal varices; due for repeat in Nov 2013   . S/P colonoscopy May 2012    Tubular adenoma  . Low back pain     possible bulging disc, bone scan   . Cancer ([place])     bladder ca 05/2009 removal and  w/chemo wash  . Esophageal varices with bleeding(456.0) 11/24/11    s/p emergent EGD 11/25/11 by Dr. Hilarie Fredrickson at  [facility], Grade III esophageal varices s/p banding X 5  . Small bowel obstruction ([place]) 02/15/2012    Admitted to [facility], managed by Dr. Geroge Baseman, ventral hernia manually reduced  . Ventral hernia 02/15/12  . MRSA (methicillin resistant Staphylococcus aureus)   . Acute post-ligation esophageal ulcer with hemorrhage 04/11/2012  . Clostridium difficile infection   . H. pylori infection 12/24/2012  . Sleep apnea     [facility] took him off CPAP when had tips procedure  . Sleep apnea   . Cellulitis 04/2014    right upper leg  . Iron deficiency anemia secondary to blood loss (chronic) 03/09/2009    Secondary to GI blood loss- Rectal and esophageal varices, portal gastropathy, and esophageal ulcers    . Bladder cancer ([place])     . Chronic diastolic CHF (congestive heart failure) ([place]) 03/21/2016    01/2016 echo: EF 60-65%; LVH; grade 2 diastolic dysfunction.  . CKD (chronic kidney disease), stage III 03/21/2016  . DKA, type 2 ([place]) 03/19/2016       Review of Systems  All other systems reviewed and are negative.      Objective:   Physical Exam  Eyes: Scleral icterus is present.  Neck: No JVD present. No thyromegaly present.  Cardiovascular: Normal rate, regular rhythm and normal heart sounds.   No murmur heard. Pulmonary/Chest: Effort normal and breath sounds normal. No respiratory distress. He has no wheezes. He has no rales.  Abdominal: Soft. Bowel sounds are normal. He exhibits distension. There is no tenderness. There is no rebound and no guarding.  Musculoskeletal: He exhibits no edema.  Lymphadenopathy:    He has no cervical adenopathy.          Assessment & Plan:  Uncontrolled type 2 diabetes mellitus with diabetic neuropathy, unspecified long term insulin use status ([place]) - Plan: Ambulatory referral to Endocrinology  Gastroparesis  Continue Reglan for gastroparesis as this seems to help dramatically. Change insulin dosage. Decrease Humalog to 200 units with meals and increase Toujeo to 100 units twice a day. I'm trying to increase his basal insulin to get more consistency in his sugars and decrease his rapid acting insulin to avoid some of the hypoglycemia. Recheck sugars in one week. Consult endocrinology as soon as possible. He previously saw Dr. Buddy Duty but would like a second opinion

## 2016-05-02 ENCOUNTER — Other Ambulatory Visit: Payer: Self-pay | Admitting: Family Medicine

## 2016-05-02 NOTE — Telephone Encounter (Signed)
Refill appropriate and filled per protocol. 

## 2016-05-08 ENCOUNTER — Ambulatory Visit (INDEPENDENT_AMBULATORY_CARE_PROVIDER_SITE_OTHER): Payer: Medicaid Other | Admitting: Family Medicine

## 2016-05-08 ENCOUNTER — Encounter: Payer: Self-pay | Admitting: Family Medicine

## 2016-05-08 VITALS — BP 90/58 | HR 86 | Temp 98.2°F | Resp 18 | Wt 252.0 lb

## 2016-05-08 DIAGNOSIS — E1140 Type 2 diabetes mellitus with diabetic neuropathy, unspecified: Secondary | ICD-10-CM

## 2016-05-08 DIAGNOSIS — E1165 Type 2 diabetes mellitus with hyperglycemia: Secondary | ICD-10-CM | POA: Diagnosis not present

## 2016-05-08 MED ORDER — MUPIROCIN 2 % EX OINT: 1.0000 "application " | TOPICAL_OINTMENT | Freq: Two times a day (BID) | CUTANEOUS | Status: DC

## 2016-05-08 NOTE — Progress Notes (Signed)
Subjective:    Patient ID: Kyle [name], male    DOB: [birth year], 60 y.o.   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 He is currently on lactulose once a day 1 tablespoon. He is also taking xifaxin.  Since February, he has lost substantial weight.  However this is with aggressive diuresis under the care of his gastroenterologist as well as his cardiologist. He has no pitting edema in his legs. There is no pulmonary edema on examination. However over the last several weeks he has become progressively lethargic, hypersomnolent, more confused, more forgetful. His wife also reports that he has more tremors. On examination today he demonstrates asterixis. He also has some mild scleral icterus. He is no longer taking pain medication or sleeping pills on a daily basis although he is taking gabapentin which could cause some hypersomnolence. He denies any symptoms of infection. He denies any fevers or chills nausea vomiting or diarrhea. He denies any dysuria or cough or shortness of breath.  At that time, my plan was: I believe his hypersomnolence is secondary to mild chronic hepatic encephalopathy which is slowly worsening. I recommended that they have waited all sleeping medication. I recommended that they have all pain medicine. I will amend of the face discontinue gabapentin or any other medication that can cause somnolence. I recommended that they increase lactulose to 30 mg 4 times a day. I will check a CBC, CMP, serum lactic acid, urinalysis, and ammonia level. If encephalopathy worsens, he needs to go directly to ER.     03/24/16 Lab work returned with a critically elevated blood sugar greater than 400 and evidence of DKA with anion gap of 17.  Referred to the emergency room where he was admitted to stepdown unit and treated with insulin and IV fluids. Ironically his hypersomnolence had essentially resolved on the elevated dose of lactulose after  just 24 hours and by withholding the gabapentin. He is here today for hospital follow-up. I have copied relevant portions of the discharge summary below and included them for my reference:  Admit date: 03/19/2016 Discharge date: 03/21/2016  Recommendations for Outpatient Follow-up:  1. Recommend follow-up of the patient's fluid status and electrolytes as the dose of Lasix was decreased and metolazone was decreased to when necessary only. 2. Recommend further outpatient management of diabetes. 3. Recommend follow-up ammonia level.    Discharge Diagnoses:   DKA, type 2 ([place])  Acute kidney injury ([place])  Esophageal varices ([place])  Decompensated liver disease ([place])  Hepatic encephalopathy syndrome (HCC)  Liver cirrhosis secondary to [name]  Hypokalemia  Chronic diastolic CHF (congestive heart failure) (HCC)  Malignant neoplasm of bladder (HCC)  Iron deficiency anemia due to chronic blood loss  SMOKER  Thrombocytopenia due to hypersplenism  Hyponatremia  Hyperbilirubinemia  CKD (chronic kidney disease), stage III   Discharge Condition: Improved.  Diet recommendation: Heart healthy/carbo hydrate modified.  Filed Weights   03/19/16 1543 03/19/16 1900 03/20/16 1900  Weight: 113.399 kg (250 lb) 113 kg (249 lb 1.9 oz) 113.4 kg (250 lb)    History of present illness:  Patient is a Kyle [name] with [name] cirrhosis, uncontrolled type 2 diabetes mellitus, chronic thrombocytopenia secondary to cirrhosis, history of upper GI bleed secondary to esophageal varices, bladder cancer-status post TURBT 2, and iron deficiency anemia, who was admitted to the hospital on 03/19/2016 for confusion, elevated blood glucose, and acute renal failure. In the ED patient's vitals were stable. Blood work showed WBC of  7.2, hemoglobin of 13.9, platelets of 57, sodium of 127, chloride of 83 with a gap of 17. BUN and creatinine were elevated to 65 and 2.90. Blood glucose was 568. Chest  x-ray was unremarkable. UA sent. LFTs showed mildly elevated AST and total bili of 7.4. (Increased from 4.92 months ago).Ammonia level was 114. He was admitted for hepatic encephalopathy, DKA, and acute renal failure.  Hospital Course:  1. DKA and type 2 diabetes. Patient is treated chronically with Lantus and Humulin R. His blood glucose was 568 on admission and his anion gap was 17. He was started on vigorous IV fluids and the insulin drip Glucomander protocol. His electrolytes were monitored closely. Following treatment, his anion gap normalized. Insulin drip was discontinued and Lantus with sliding scale NovoLog were started. He was actually started on less insulin than he is prescribed at home, with his CBGs ranging from the 150s to the 280s. In light of his admitted noncompliance at times, his home dosing of insulin was not changed. Will defer further management to his PCP. -Hemoglobin A1c was ordered and was 11.0.  Acute kidney injury with stage II-3 chronic kidney disease; secondary to prerenal azotemia. Patient's BUN was 65 and creatinine was 2.90 on admission. His creatinine was 1.07 six months ago and 1.6 two months ago and 2.22 weeks ago.. -Per his account, he had been started on vigorous diuretic therapy for lower extremity edema over the past few months; presumably from cirrhosis. He is treated with Zaroxolyn, spironolactone, and Lasix. -He was started on vigorous IV fluids and his diuretics were withheld. There was no significant peripheral edema on exam. -His creatinine improved to 2.16, likely close to baseline prior to discharge. -At the time of discharge, he was instructed to decrease Lasix from 80 to 40 mg daily and to change metolazone to twice a week only if needed for increase in leg swelling and to maintain spironolactone as previously prescribed. -Recommend follow-up of his fluid status and electrolytes.  Hyponatremia. Patient serum sodium was 127 on admission. His  diuretics were held and he was started on vigorous IV fluids with normal saline. His serum sodium improved to 133. -Etiology likely secondary to DKA in the setting of volume depletion.  Hypokalemia. Patient serum potassium fell to 3.1 following correction of DKA. He was started on potassium chloride and given several IV runs of potassium. His serum potassium improved.  Karlene Lineman cirrhosis with resultant thrombocytopenia, hypersplenism, esophageal varices, and hyperbilirubinemia. Patient is followed by Dr. Gala Romney in [place] and [facility] transplant center. Apparently patient is on the transplant list. He is treated chronically with diuretics, propanolol, and lactulose and rifaximin. -He was continued on propanolol, lactulose, rifaximin. His PPI was also continued. -His platelet count appears to be at baseline, ranged from 55-65. -His bilirubin trended down from 7.4 to 6.7.  Hepatic encephalopathy. Apparently, the patient has some confusion in the outpatient setting. Outpatient laboratory studies per his PCP, Dr. Dennard Schaumann revealed an elevated ammonia level. The dose of lactulose was increased to 30 g- 4 times a day by his PCP, but the patient stated he had been taking 15 g 3 times daily. -Follow-up ammonia level improved to 89, but stabilized at 107. He had no signs of hepatic encephalopathy. -He was instructed to increase lactulose to 30 g 4 times daily as prescribed by his PCP. He voiced understanding.  Chronic diastolic heart failure. This remained compensated during hospitalization. His echo 01/2016 revealed an EF of 60-65% and grade 2 diastolic dysfunction.  Tobacco abuse.  The patient was advised to stop smoking. Nicotine patch was given and tobacco cessation counseling was ordered.       03/24/16 Today on follow-up, the patient states that he was taking Toujeo 40 units everyday and humalog 60 TID AC.  He states that he was being compliant and therefore his hemoglobin A1c is more a reflection of an  adequate insulin dosage rather than noncompliance. He states that his fasting blood sugars are typically 190 in that area and his postprandial sugars were usually 200-300. I believe that with the aggressive diuresis and dehydration his sugars worsened due to volume depletion.  I believe this coupled with his hepatic encephalopathy and his acute renal insufficiency led to his confusion and hypersomnolence. His wife states that since discharge from hospital he has been doing well. He is yet to take lactulose today and this morning he is very lethargic and sleepy. However she states that prior to this morning he has been doing fine.  At that time, my plan was: Patient appears very lethargic. I recommended they leave immediately and go take a dose of lactulose. I would like him to continue the lactulose 4 times a day as this seems to be managing his hepatic encephalopathy at the present time. I want him to increase Toujeo to 60 untis qam and record fasting blood sugars and recheck with me on Friday. We will continue to increase Toujeo until fasting blood sugar falls below 130. Continue his current dose of Humalog at the present time. I will not adjust Humalog until his fasting blood sugars are below 130. I'll recheck his renal function today. Recheck on Friday  03/31/16  Friday, the patient had a blood sugar falls to the 30s. His wife had to call EMS as the patient lost consciousness and fell striking his right anterior ribs. He is now very tender in that area. There is no bruising however he has significant tenderness to palpation over the right anterior ribs just below his nipple. He denies any cough or hemoptysis. However that blood sugar seems to be an aberration. The majority of his blood sugars are between 100- 250.  At that time, my plan was: I believe the patient became confused and administered too much insulin or missed a meal or both causing his hypoglycemic reaction. The remainder of his sugars are  actually elevated. Therefore I will make no changes in his insulin at the present time. I have asked him to check fasting blood sugars and two-hour postprandial sugars over the next week and bring the values in for me to review. I want to see if the hypoglycemic episode is an aberration or if it is occurring more frequently. I will ask his wife to administer his insulin to avoid confusion  04/25/16 Never provided the requested sugars to titrate his insulin further.  Instead received a call:  Pt calling because pharmacy will not refill his Humulin R insulin 500u/ml strength. They fear he will not dose himself correctly as he has syringes titrated for 100u/ml insulin. Feel pens would be better. Pharmacist also says pt has not been to see Endocrinologist (apparently they called him about this) in over a year. He agreed that someone needs to see and talk to pt to make sure he is administering this correctly. Per pharmacy discussion with patient feel he may be giving himself too much. Please advise.  Sugars are all over the map.  They range anywhere from 120-430. Patient states that he is unable to eat. He  becomes quickly nauseated as soon as he eats. He is throwing up immediately after he eats. He is still having 2-3 bowel movements a day. He denies any fevers or chills. He denies any blood in his stools. However he has been unable to eat and therefore the amount of insulin he takes varies from day-to-day. He is essentially guessing at the dose of insulin he needs. He is correct that he is taking 60 units according to his needle however his insulin is actually the concentrated Humulin R 500 units per mL. Essentially he is taking 300 units 3 times a day. I was unaware of this obviously this 60 units of Toujeo is a drop in the bucket and likely has no impact on his sugars. Patient is very lethargic and confused and somnolent. He has a difficult time answering questions. It is obvious that his hepatic  encephalopathy is waxing and waning and this appears to be his new baseline even on the lactulose. He is no longer taking gabapentin or any pain medication. On examination today he has asterixis.  He appears slightly dehydrated.  He continues to lose weight likely due to decreased oral intake.     At that time, my plan was:  I will make no attempt at trying to control his sugars at the present time. His sugars are too highly variable and he runs the risk of hypoglycemia. I need to regulate his oral intake and get some level of consistency in what he is taking in. Some days he doesn't even eat, some days he is able to eat 3 small meals. I believe he likely has gastroparesis secondary to his uncontrolled diabetes mellitus. Therefore I'm going to add Reglan 10 mg 3 times a day prior to meals. Continue his current dose of insulin and recheck in one week. Hopefully if he is eating better and better hydrated week and then try to titrate his insulin to achieve blood sugars between 100-200 with some level of consistency.  05/01/16 The patient looks and feels much better after starting the Reglan. He is now eating better. He is less dehydrated. He is more alert and less confused. Unfortunately his sugar a complete mess. Blood sugars range from 50-500. He is taking 300 units 3 times a day of Humalog. At times this is too much and at other times it is likely insufficient. He is on an inadequate amount of basal insulin.  AT that time, my plan was: Continue Reglan for gastroparesis as this seems to help dramatically. Change insulin dosage. Decrease Humalog to 200 units with meals and increase Toujeo to 100 units twice a day. I'm trying to increase his basal insulin to get more consistency in his sugars and decrease his rapid acting insulin to avoid some of the hypoglycemia. Recheck sugars in one week. Consult endocrinology as soon as possible. He previously saw Dr. Buddy Duty but would like a second opinion  05/08/16 Blood  sugars have level off. However they're now consistently 300 400 without any episodes of hypoglycemia. Past Surgical History  Procedure Laterality Date  . Carpel tunnel Right   . Shoulder surgery Left   . Adrenal mass surgery  05/2009    benign, left  . Bladder surgery  01/2009 and 06/2010    cancer 2010, small recurrence in 06/2010  . Colonoscopy  04/2009    moderate int hemorrhoids, rare sigmoid diverticula, one mm sessile hyperplastic rectal polyp  . Esophagogastroduodenoscopy  03/2009    small hh  . Small bowel capsule  endoscopy  03/2009    couple of small benign appearing erosions, nonbleeding  . Egd/tcs  08/2007    small hiatal hernia, pancolonic diverticula, friable anal canal, 3cm salmon colored epithelium in distal esophagus, bx negative for Barrett's  . Skin cancer excision  Oct 2012    left arm  . Esophagogastroduodenoscopy  11/25/2011    Dr. Phineas Douglas III varices in the mid and distal esophagus, banding placed, portal astropathy  . Esophagogastroduodenoscopy  01/14/2012    Dr Rourk->4-5 columns Gr2 varices, 6 bands placed, HH, distal esophageal ulcer, portal gastropathy, antral erosions  . Esophagogastroduodenoscopy  03/17/2012    Dr. Larina Bras varices status post band ligation. Hiatal hernia. Portal gastropathy.  . Umbilical hernia repair  04/05/2012    Procedure: HERNIA REPAIR UMBILICAL ADULT;  Surgeon: Donato Heinz, MD;  Location: [facility];  Service: General;  Laterality: N/A;  . Esophagogastroduodenoscopy  04/11/2012    Dr. Bridgett Larsson ulcer possibly at previous banding sites with stigmata     of bleeding.  Attempt at hemostasis with hemoclip and banding was not successful resulting in recurrence of bleed/  Portal gastropathy, but no evidence of gastric varices/ Recurrent esophageal varices grade 2.  . Esophagogastroduodenoscopy  05/24/2012    Dr. Larina Bras varices, portal gastropathy  . Esophagogastroduodenoscopy  04/11/12    Dr. Drue Novel in the distal  esophagus adherent lot and visible vessel treated with bicap. grade I varices in the distal esophagus, one ligating band placed, portal hypertensive gastropathy in the body of the stomach.  . Colonoscopy  09/29/2012    [id number] varices. Scattered pancolonic diverticula/single cecal polyp-removed as described above  . Esophagogastroduodenoscopy  12/24/2012    Dr. Lynwood Dawley significant upper GI bleed secondary to bleeding esophageal varices. s/p hemostasis therapy with injection of a total of 47mL of 5% ethanolamine and application of 9 bands, complete egd not carried out.  . Schlerotherapy  12/24/2012    Procedure: Woodward Ku OF VARICES;  Surgeon: Daneil Dolin, MD;  Location: [facility];  Service: Endoscopy;  Laterality: N/A;  . Esophageal banding  12/24/2012    [id number] significant upper GI bleed secondary to bleeding esophageal varices  . Tips procedure  12/2012    [facility]  . Cataract extraction, bilateral    . Hernia repair      umbilical  . Eye surgery      bilateral cataract surgery with lens implants  . Transurethral resection of bladder tumor N/A 03/12/2015    Procedure: TRANSURETHRAL RESECTION OF BLADDER TUMOR (TURBT);  Surgeon: Raynelle Bring, MD;  Location: [facility];  Service: Urology;  Laterality: N/A;  . Cystoscopy w/ ureteral stent placement Bilateral 03/12/2015    Procedure: CYSTOSCOPY WITH BILATERAL RETROGRADE PYELOGRAM/RIGHT URETEROSCOPY;  Surgeon: Raynelle Bring, MD;  Location: [facility];  Service: Urology;  Laterality: Bilateral;  . Circumcision N/A 03/12/2015    Procedure: CIRCUMCISION ADULT;  Surgeon: Raynelle Bring, MD;  Location: [facility];  Service: Urology;  Laterality: N/A;   Current Outpatient Prescriptions on File Prior to Visit  Medication Sig Dispense Refill  . ACCU-CHEK AVIVA PLUS test strip USE TEST STRIPS TO CHECK BLOOD SUGAR THREE TIMES DAILY AS DIRECTED 100 each 11  . furosemide (LASIX) 40 MG tablet Dose has been decreased to 1 tablet daily. You  may need to restart 2 tablets daily, but this decision will be made by your primary physician or gastroenterologist or cardiologist.    . Insulin Pen Needle 29G X 12.7MM MISC Use with Toujeo solostar 100 each 11  . insulin regular  human CONCENTRATED (HUMULIN R) 500 UNIT/ML injection Inject 0.6 mLs (300 Units total) into the skin 3 (three) times daily. 20 mL 11  . Insulin Syringes, Disposable, (B-D INSULIN SYRINGE 1CC) U-100 1 ML MISC Inject 60 Units into the skin 3 (three) times daily. 500 each 11  . lactulose (CHRONULAC) 10 GM/15ML solution Take 45 mLs (30 g total) by mouth 3 (three) times daily. 1892 mL 3  . metoCLOPramide (REGLAN) 10 MG tablet Take 1 tablet (10 mg total) by mouth 3 (three) times daily before meals. 90 tablet 0  . metolazone (ZAROXOLYN) 2.5 MG tablet Take twice weekly only as needed for increase in leg swelling.    [name] omeprazole (PRILOSEC) 40 MG capsule TAKE ONE CAPSULE BY MOUTH ONCE DAILY 30 capsule 5  . oxyCODONE (ROXICODONE) 5 MG immediate release tablet Take 1 tablet (5 mg total) by mouth every 4 (four) hours as needed for severe pain. 8 tablet 0  . potassium chloride 20 MEQ TBCR Take 20 mEq by mouth 2 (two) times daily. 10 tablet 0  . propranolol (INDERAL) 20 MG tablet TAKE ONE TABLET BY MOUTH TWICE DAILY 60 tablet 3  . spironolactone (ALDACTONE) 50 MG tablet Take 50 mg by mouth 2 (two) times daily.     . Suvorexant (BELSOMRA) 10 MG TABS Take by mouth.    . TOUJEO SOLOSTAR 300 UNIT/ML SOPN INJECT 40 UNITS SUBCUTANEOUSLY DAILY -- TAKE WITH HUMULIN R AND RECHECK FASTING BLOOD SUGAR AND 2 HOUR AFTER EATING IN 2 WEEKS. 15 pen 0  . triamcinolone cream (KENALOG) 0.1 % Apply 1 application topically 2 (two) times daily. 30 g 0  . XIFAXAN 550 MG TABS tablet TAKE ONE TABLET BY MOUTH TWICE DAILY 60 tablet 3   No current facility-administered medications on file prior to visit.   Allergies  Allergen Reactions  . Aspirin Other (See Comments)    Harms liver.   . Ibuprofen Other (See  Comments)    Can't take per Dr Gala Romney- harms livers  . Tylenol [Acetaminophen] Other (See Comments)    Restless Legs    Social History   Social History  . Marital Status: Married    Spouse Name: N/A  . Number of Children: 3  . Years of Education: N/A   Occupational History  . disabled    Social History Main Topics  . Smoking status: Current Every Day Smoker -- 1.00 packs/day for 30 years    Types: Cigarettes  . Smokeless tobacco: Never Used  . Alcohol Use: No     Comment: drank heavily for few years in 20s  . Drug Use: No  . Sexual Activity: Yes    Birth Control/ Protection: None   Other Topics Concern  . Not on file   Social History Narrative    Past Medical History  Diagnosis Date  . DM (diabetes mellitus) ([place])   . Cirrhosis ([place])     bx proven steatohepatitis with cirrhosis (2010); per note from Feb 2011, received Hep A and B vaccines in 2010.  [name] HTN (hypertension)   . RLS (restless legs syndrome)   . Hyperlipidemia   . IDA (iron deficiency anemia)   . GERD (gastroesophageal reflux disease)   . Depression   . Peripheral neuropathy ([place])   . Urothelial cancer ([place])     2010, paillary low-grade, h/o recurrence 2011, recurrence 02/2015  . B12 deficiency   . Psoriasis   . 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 Anemia due to multiple mechanisms 05/13/2011  .  History of alcohol abuse 05/13/2011  . ANEMIA-IRON DEFICIENCY 03/09/2009  . S/P endoscopy May 2012    4 columns grade II esophageal varices; due for repeat in Nov 2013   . S/P colonoscopy May 2012    Tubular adenoma  . Low back pain     possible bulging disc, bone scan   . Cancer ([place])     bladder ca 05/2009 removal and  w/chemo wash  . Esophageal varices with bleeding(456.0) 11/24/11    s/p emergent EGD 11/25/11 by Dr. Hilarie Fredrickson at [facility], Grade III esophageal varices s/p banding X 5  . Small bowel obstruction ([place]) 02/15/2012    Admitted to [facility], managed by Dr. Geroge Baseman, ventral hernia manually reduced    . Ventral hernia 02/15/12  . MRSA (methicillin resistant Staphylococcus aureus)   . Acute post-ligation esophageal ulcer with hemorrhage 04/11/2012  . Clostridium difficile infection   . H. pylori infection 12/24/2012  . Sleep apnea     [facility] took him off CPAP when had tips procedure  . Sleep apnea   . Cellulitis 04/2014    right upper leg  . Iron deficiency anemia secondary to blood loss (chronic) 03/09/2009    Secondary to GI blood loss- Rectal and esophageal varices, portal gastropathy, and esophageal ulcers    . Bladder cancer ([place])   . Chronic diastolic CHF (congestive heart failure) ([place]) 03/21/2016    01/2016 echo: EF 60-65%; LVH; grade 2 diastolic dysfunction.  . CKD (chronic kidney disease), stage III 03/21/2016  . DKA, type 2 ([place]) 03/19/2016       Review of Systems  All other systems reviewed and are negative.      Objective:   Physical Exam  Eyes: Scleral icterus is present.  Neck: No JVD present. No thyromegaly present.  Cardiovascular: Normal rate, regular rhythm and normal heart sounds.   No murmur heard. Pulmonary/Chest: Effort normal and breath sounds normal. No respiratory distress. He has no wheezes. He has no rales.  Abdominal: Soft. Bowel sounds are normal. He exhibits distension. There is no tenderness. There is no rebound and no guarding.  Musculoskeletal: He exhibits no edema.  Lymphadenopathy:    He has no cervical adenopathy.          Assessment & Plan:  Uncontrolled type 2 diabetes mellitus with diabetic neuropathy, unspecified long term insulin use status (HCC)  Continue Humalog 200 units with meals. Increase Toujeo to 150 units bid and recheck Monday.  Continue to increase basal insulin until sugars are between 100-200.

## 2016-05-09 ENCOUNTER — Other Ambulatory Visit (HOSPITAL_COMMUNITY): Payer: Self-pay | Admitting: Oncology

## 2016-05-12 ENCOUNTER — Other Ambulatory Visit: Payer: Self-pay

## 2016-05-15 ENCOUNTER — Other Ambulatory Visit: Payer: Self-pay

## 2016-05-15 ENCOUNTER — Inpatient Hospital Stay (HOSPITAL_COMMUNITY)
Admission: EM | Admit: 2016-05-15 | Discharge: 2016-05-17 | DRG: 441 | Disposition: A | Discharge status: Home / Self Care | Payer: Medicaid Other | Attending: Internal Medicine | Admitting: Internal Medicine

## 2016-05-15 ENCOUNTER — Encounter (HOSPITAL_COMMUNITY): Payer: Self-pay | Admitting: Emergency Medicine

## 2016-05-15 ENCOUNTER — Emergency Department (HOSPITAL_COMMUNITY): Payer: Medicaid Other

## 2016-05-15 DIAGNOSIS — G4730 Sleep apnea, unspecified: Secondary | ICD-10-CM | POA: Diagnosis present

## 2016-05-15 DIAGNOSIS — I5032 Chronic diastolic (congestive) heart failure: Secondary | ICD-10-CM | POA: Diagnosis present

## 2016-05-15 DIAGNOSIS — R040 Epistaxis: Secondary | ICD-10-CM | POA: Diagnosis present

## 2016-05-15 DIAGNOSIS — R739 Hyperglycemia, unspecified: Secondary | ICD-10-CM

## 2016-05-15 DIAGNOSIS — I130 Hypertensive heart and chronic kidney disease with heart failure and stage 1 through stage 4 chronic kidney disease, or unspecified chronic kidney disease: Secondary | ICD-10-CM | POA: Diagnosis present

## 2016-05-15 DIAGNOSIS — Z66 Do not resuscitate: Secondary | ICD-10-CM | POA: Diagnosis present

## 2016-05-15 DIAGNOSIS — I959 Hypotension, unspecified: Secondary | ICD-10-CM | POA: Diagnosis present

## 2016-05-15 DIAGNOSIS — Z8551 Personal history of malignant neoplasm of bladder: Secondary | ICD-10-CM

## 2016-05-15 DIAGNOSIS — G9341 Metabolic encephalopathy: Secondary | ICD-10-CM | POA: Diagnosis present

## 2016-05-15 DIAGNOSIS — K7290 Hepatic failure, unspecified without coma: Principal | ICD-10-CM | POA: Diagnosis present

## 2016-05-15 DIAGNOSIS — Z886 Allergy status to analgesic agent status: Secondary | ICD-10-CM

## 2016-05-15 DIAGNOSIS — E870 Hyperosmolality and hypernatremia: Secondary | ICD-10-CM | POA: Diagnosis present

## 2016-05-15 DIAGNOSIS — Z794 Long term (current) use of insulin: Secondary | ICD-10-CM

## 2016-05-15 DIAGNOSIS — K219 Gastro-esophageal reflux disease without esophagitis: Secondary | ICD-10-CM | POA: Diagnosis present

## 2016-05-15 DIAGNOSIS — K7682 Hepatic encephalopathy: Secondary | ICD-10-CM | POA: Diagnosis present

## 2016-05-15 DIAGNOSIS — G2581 Restless legs syndrome: Secondary | ICD-10-CM | POA: Diagnosis present

## 2016-05-15 DIAGNOSIS — D62 Acute posthemorrhagic anemia: Secondary | ICD-10-CM | POA: Diagnosis present

## 2016-05-15 DIAGNOSIS — E1110 Type 2 diabetes mellitus with ketoacidosis without coma: Secondary | ICD-10-CM | POA: Diagnosis present

## 2016-05-15 DIAGNOSIS — K7581 Nonalcoholic steatohepatitis (NASH): Secondary | ICD-10-CM | POA: Diagnosis present

## 2016-05-15 DIAGNOSIS — K7460 Unspecified cirrhosis of liver: Secondary | ICD-10-CM | POA: Diagnosis present

## 2016-05-15 DIAGNOSIS — E860 Dehydration: Secondary | ICD-10-CM | POA: Diagnosis present

## 2016-05-15 DIAGNOSIS — Z85828 Personal history of other malignant neoplasm of skin: Secondary | ICD-10-CM

## 2016-05-15 DIAGNOSIS — Z8719 Personal history of other diseases of the digestive system: Secondary | ICD-10-CM

## 2016-05-15 DIAGNOSIS — I8500 Esophageal varices without bleeding: Secondary | ICD-10-CM | POA: Diagnosis present

## 2016-05-15 DIAGNOSIS — D696 Thrombocytopenia, unspecified: Secondary | ICD-10-CM | POA: Diagnosis present

## 2016-05-15 DIAGNOSIS — E1100 Type 2 diabetes mellitus with hyperosmolarity without nonketotic hyperglycemic-hyperosmolar coma (NKHHC): Secondary | ICD-10-CM | POA: Diagnosis present

## 2016-05-15 DIAGNOSIS — E861 Hypovolemia: Secondary | ICD-10-CM | POA: Diagnosis present

## 2016-05-15 DIAGNOSIS — E785 Hyperlipidemia, unspecified: Secondary | ICD-10-CM | POA: Diagnosis present

## 2016-05-15 DIAGNOSIS — F17210 Nicotine dependence, cigarettes, uncomplicated: Secondary | ICD-10-CM | POA: Diagnosis present

## 2016-05-15 DIAGNOSIS — N182 Chronic kidney disease, stage 2 (mild): Secondary | ICD-10-CM | POA: Diagnosis present

## 2016-05-15 LAB — I-STAT CHEM 8, ED
BUN: 20 mg/dL (ref 6–20)
CHLORIDE: 94 mmol/L — AB (ref 101–111)
Calcium, Ion: 1.16 mmol/L (ref 1.12–1.23)
Creatinine, Ser: 1.5 mg/dL — ABNORMAL HIGH (ref 0.61–1.24)
GLUCOSE: 631 mg/dL — AB (ref 65–99)
HEMATOCRIT: 34 % — AB (ref 39.0–52.0)
HEMOGLOBIN: 11.6 g/dL — AB (ref 13.0–17.0)
POTASSIUM: 4.7 mmol/L (ref 3.5–5.1)
SODIUM: 130 mmol/L — AB (ref 135–145)
TCO2: 21 mmol/L (ref 0–100)

## 2016-05-15 LAB — PROTIME-INR
INR: 1.59 — AB (ref 0.00–1.49)
Prothrombin Time: 19 seconds — ABNORMAL HIGH (ref 11.6–15.2)

## 2016-05-15 NOTE — Progress Notes (Signed)
Cardiology Office Note   Date:  05/15/2016   ID:  HURSEL MCNELL, DOB 02-20-1956, MRN [id number]  PCP:  Odette Fraction, MD  Cardiologist: Ross/ Jory Sims, NP  ERROR Cancelled

## 2016-05-15 NOTE — ED Notes (Signed)
Patient has nosebleed for past couple hours and hypotension.

## 2016-05-15 NOTE — ED Provider Notes (Addendum)
CSN: [id number]     Arrival date & time 05/15/16  2329 History  By signing my name below, I, Ephriam Jenkins, attest that this documentation has been prepared under the direction and in the presence of Orpah Greek, MD. Electronically signed, Ephriam Jenkins, ED Scribe. 05/15/2016. 12:08 AM.    Chief Complaint  Patient presents with  . Epistaxis  . Hypotension   The history is provided by the patient and the EMS personnel. No language interpreter was used.   HPI Comments: Kyle [name] is a 60 y.o. male with a PMHx of Cirrhosis, heart failure, HTN, DM, HLD, IDA, CA, brought in by ambulance, who presents to the Emergency Department after a constant nosebleed that has been persistent for the past two hours. Pt reports bleeding from both nostrils and mouth. Per EMS, pt was hypotensive on scene and on arrival to the hospital. Pt is allergic to aspirin, tylenol and motrin.   Past Medical History  Diagnosis Date  . DM (diabetes mellitus) ([place])   . Cirrhosis ([place])     bx proven steatohepatitis with cirrhosis (2010); per note from Feb 2011, received Hep A and B vaccines in 2010.  [name] HTN (hypertension)   . RLS (restless legs syndrome)   . Hyperlipidemia   . IDA (iron deficiency anemia)   . GERD (gastroesophageal reflux disease)   . Depression   . Peripheral neuropathy ([place])   . Urothelial cancer ([place])     2010, paillary low-grade, h/o recurrence 2011, recurrence 02/2015  . B12 deficiency   . Psoriasis   . 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 Anemia due to multiple mechanisms 05/13/2011  . History of alcohol abuse 05/13/2011  . ANEMIA-IRON DEFICIENCY 03/09/2009  . S/P endoscopy May 2012    4 columns grade II esophageal varices; due for repeat in Nov 2013   . S/P colonoscopy May 2012    Tubular adenoma  . Low back pain     possible bulging disc, bone scan   . Cancer ([place])     bladder ca 05/2009 removal and  w/chemo wash  . Esophageal varices with bleeding(456.0) 11/24/11    s/p  emergent EGD 11/25/11 by Dr. Hilarie Fredrickson at [facility], Grade III esophageal varices s/p banding X 5  . Small bowel obstruction ([place]) 02/15/2012    Admitted to [facility], managed by Dr. Geroge Baseman, ventral hernia manually reduced  . Ventral hernia 02/15/12  . MRSA (methicillin resistant Staphylococcus aureus)   . Acute post-ligation esophageal ulcer with hemorrhage 04/11/2012  . Clostridium difficile infection   . H. pylori infection 12/24/2012  . Sleep apnea     [facility] took him off CPAP when had tips procedure  . Sleep apnea   . Cellulitis 04/2014    right upper leg  . Iron deficiency anemia secondary to blood loss (chronic) 03/09/2009    Secondary to GI blood loss- Rectal and esophageal varices, portal gastropathy, and esophageal ulcers    . Bladder cancer ([place])   . Chronic diastolic CHF (congestive heart failure) ([place]) 03/21/2016    01/2016 echo: EF 60-65%; LVH; grade 2 diastolic dysfunction.  . CKD (chronic kidney disease), stage III 03/21/2016  . DKA, type 2 ([place]) 03/19/2016   Past Surgical History  Procedure Laterality Date  . Carpel tunnel Right   . Shoulder surgery Left   . Adrenal mass surgery  05/2009    benign, left  . Bladder surgery  01/2009 and 06/2010    cancer 2010, small recurrence in 06/2010  .  Colonoscopy  04/2009    moderate int hemorrhoids, rare sigmoid diverticula, one mm sessile hyperplastic rectal polyp  . Esophagogastroduodenoscopy  03/2009    small hh  . Small bowel capsule endoscopy  03/2009    couple of small benign appearing erosions, nonbleeding  . Egd/tcs  08/2007    small hiatal hernia, pancolonic diverticula, friable anal canal, 3cm salmon colored epithelium in distal esophagus, bx negative for Barrett's  . Skin cancer excision  Oct 2012    left arm  . Esophagogastroduodenoscopy  11/25/2011    Dr. Phineas Douglas III varices in the mid and distal esophagus, banding placed, portal astropathy  . Esophagogastroduodenoscopy  01/14/2012    Dr Rourk->4-5 columns Gr2 varices, 6 bands placed, HH,  distal esophageal ulcer, portal gastropathy, antral erosions  . Esophagogastroduodenoscopy  03/17/2012    Dr. Larina Bras varices status post band ligation. Hiatal hernia. Portal gastropathy.  . Umbilical hernia repair  04/05/2012    Procedure: HERNIA REPAIR UMBILICAL ADULT;  Surgeon: Donato Heinz, MD;  Location: [facility];  Service: General;  Laterality: N/A;  . Esophagogastroduodenoscopy  04/11/2012    Dr. Bridgett Larsson ulcer possibly at previous banding sites with stigmata     of bleeding.  Attempt at hemostasis with hemoclip and banding was not successful resulting in recurrence of bleed/  Portal gastropathy, but no evidence of gastric varices/ Recurrent esophageal varices grade 2.  . Esophagogastroduodenoscopy  05/24/2012    Dr. Larina Bras varices, portal gastropathy  . Esophagogastroduodenoscopy  04/11/12    Dr. Drue Novel in the distal esophagus adherent lot and visible vessel treated with bicap. grade I varices in the distal esophagus, one ligating band placed, portal hypertensive gastropathy in the body of the stomach.  . Colonoscopy  09/29/2012    [id number] varices. Scattered pancolonic diverticula/single cecal polyp-removed as described above  . Esophagogastroduodenoscopy  12/24/2012    Dr. Lynwood Dawley significant upper GI bleed secondary to bleeding esophageal varices. s/p hemostasis therapy with injection of a total of 76mL of 5% ethanolamine and application of 9 bands, complete egd not carried out.  . Schlerotherapy  12/24/2012    Procedure: Woodward Ku OF VARICES;  Surgeon: Daneil Dolin, MD;  Location: [facility];  Service: Endoscopy;  Laterality: N/A;  . Esophageal banding  12/24/2012    [id number] significant upper GI bleed secondary to bleeding esophageal varices  . Tips procedure  12/2012    [facility]  . Cataract extraction, bilateral    . Hernia repair      umbilical  . Eye surgery      bilateral cataract surgery with lens implants  .  Transurethral resection of bladder tumor N/A 03/12/2015    Procedure: TRANSURETHRAL RESECTION OF BLADDER TUMOR (TURBT);  Surgeon: Raynelle Bring, MD;  Location: [facility];  Service: Urology;  Laterality: N/A;  . Cystoscopy w/ ureteral stent placement Bilateral 03/12/2015    Procedure: CYSTOSCOPY WITH BILATERAL RETROGRADE PYELOGRAM/RIGHT URETEROSCOPY;  Surgeon: Raynelle Bring, MD;  Location: [facility];  Service: Urology;  Laterality: Bilateral;  . Circumcision N/A 03/12/2015    Procedure: CIRCUMCISION ADULT;  Surgeon: Raynelle Bring, MD;  Location: [facility];  Service: Urology;  Laterality: N/A;   Family History  Problem Relation Age of Onset  . Cirrhosis Father     etoh  . Colon cancer Neg Hx   . Anesthesia problems Neg Hx   . Hypotension Neg Hx   . Malignant hyperthermia Neg Hx   . Pseudochol deficiency Neg Hx   . Kidney cancer Mother   . Cancer Mother   .  HIV Brother   . Cirrhosis Brother     nash   Social History  Substance Use Topics  . Smoking status: Current Every Day Smoker -- 1.00 packs/day for 30 years    Types: Cigarettes  . Smokeless tobacco: Never Used  . Alcohol Use: No     Comment: drank heavily for few years in 20s    Review of Systems  HENT: Positive for nosebleeds (both nares).   Gastrointestinal: Negative for vomiting.  All other systems reviewed and are negative.     Allergies  Aspirin; Ibuprofen; and Tylenol  Home Medications   Prior to Admission medications   Medication Sig Start Date End Date Taking? Authorizing Provider  ACCU-CHEK AVIVA PLUS test strip USE TEST STRIPS TO CHECK BLOOD SUGAR THREE TIMES DAILY AS DIRECTED 12/17/15   Orlena Sheldon, PA-C  furosemide (LASIX) 40 MG tablet Dose has been decreased to 1 tablet daily. You may need to restart 2 tablets daily, but this decision will be made by your primary physician or gastroenterologist or cardiologist. 03/21/16   Rexene Alberts, MD  Insulin Pen Needle 29G X 12.7MM MISC Use with Nelva Nay solostar 09/10/15    Susy Frizzle, MD  insulin regular human CONCENTRATED (HUMULIN R) 500 UNIT/ML injection Inject 0.6 mLs (300 Units total) into the skin 3 (three) times daily. 04/25/16   Susy Frizzle, MD  Insulin Syringes, Disposable, (B-D INSULIN SYRINGE 1CC) U-100 1 ML MISC Inject 60 Units into the skin 3 (three) times daily. 04/16/16   Susy Frizzle, MD  lactulose (CHRONULAC) 10 GM/15ML solution Take 45 mLs (30 g total) by mouth 3 (three) times daily. 03/21/16   Rexene Alberts, MD  metoCLOPramide (REGLAN) 10 MG tablet Take 1 tablet (10 mg total) by mouth 3 (three) times daily before meals. 04/25/16   Susy Frizzle, MD  metolazone (ZAROXOLYN) 2.5 MG tablet Take twice weekly only as needed for increase in leg swelling. 03/21/16   Rexene Alberts, MD  mupirocin ointment (BACTROBAN) 2 % Place 1 application into the nose 2 (two) times daily. 05/08/16   Susy Frizzle, MD  omeprazole (PRILOSEC) 40 MG capsule TAKE ONE CAPSULE BY MOUTH ONCE DAILY 12/24/15   Carlis Stable, NP  oxyCODONE (ROXICODONE) 5 MG immediate release tablet Take 1 tablet (5 mg total) by mouth every 4 (four) hours as needed for severe pain. 07/02/15   Tanna Furry, MD  potassium chloride 20 MEQ TBCR Take 20 mEq by mouth 2 (two) times daily. 07/02/15   Tanna Furry, MD  propranolol (INDERAL) 20 MG tablet TAKE ONE TABLET BY MOUTH TWICE DAILY 03/03/16   Orvil Feil, NP  spironolactone (ALDACTONE) 50 MG tablet Take 50 mg by mouth 2 (two) times daily.  03/03/14   Historical Provider, MD  Suvorexant (BELSOMRA) 10 MG TABS Take by mouth.    Historical Provider, MD  TOUJEO SOLOSTAR 300 UNIT/ML SOPN INJECT 40 UNITS SUBCUTANEOUSLY DAILY -- TAKE WITH HUMULIN R AND RECHECK FASTING BLOOD SUGAR AND 2 HOUR AFTER EATING IN 2 WEEKS. Patient taking differently: INJECT 20 UNITS SUBCUTANEOUSLY DAILY -- TAKE WITH HUMULIN R AND RECHECK FASTING BLOOD SUGAR AND 2 HOUR AFTER EATING IN 2 WEEKS. 05/02/16   Susy Frizzle, MD  triamcinolone cream (KENALOG) 0.1 % Apply 1 application topically  2 (two) times daily. 07/02/15   Tanna Furry, MD  XIFAXAN 550 MG TABS tablet TAKE ONE TABLET BY MOUTH TWICE DAILY 03/03/16   Orvil Feil, NP   BP 113/60 mmHg  Pulse 73  Temp(Src) 97.5 F (36.4 C) (Oral)  Resp 18  Ht 6\' 2"  (1.88 m)  Wt 260 lb (117.935 kg)  BMI 33.37 kg/m2  SpO2 94% Physical Exam  Constitutional: No distress.     HENT:  Head: Normocephalic and atraumatic.  Right Ear: External ear normal.  Left Ear: External ear normal.  Blood in both nares Blood in oropharynx with no active bleeding.  Eyes: Conjunctivae are normal. Right eye exhibits no discharge. Left eye exhibits no discharge. No scleral icterus.  Neck: Neck supple. No tracheal deviation present.  Cardiovascular: Normal rate, regular rhythm and intact distal pulses.   Pulmonary/Chest: Effort normal and breath sounds normal. No stridor. No respiratory distress. He has no wheezes. He has no rales.  Abdominal: Soft. Bowel sounds are normal. He exhibits no distension. There is no tenderness. There is no rebound and no guarding.  Musculoskeletal: He exhibits no edema or tenderness.  Neurological: He is alert. He has normal strength. No cranial nerve deficit (no facial droop, extraocular movements intact, no slurred speech) or sensory deficit. He exhibits normal muscle tone. He displays no seizure activity. Coordination normal.  Skin: Skin is warm and dry. No rash noted.  Jaundice  Psychiatric: He has a normal mood and affect.  Nursing note and vitals reviewed.   ED Course  Procedures  DIAGNOSTIC STUDIES: Oxygen Saturation is 94% on RA, normal by my interpretation.  COORDINATION OF CARE: 11:41 PM-Will order blood work and imaging. Discussed treatment plan with pt at bedside and pt agreed to plan.   Labs Review Labs Reviewed  CBC WITH DIFFERENTIAL/PLATELET - Abnormal; Notable for the following:    RBC 3.30 (*)    Hemoglobin 11.6 (*)    HCT 33.0 (*)    MCH 35.2 (*)    Platelets 88 (*)    All other components  within normal limits  BASIC METABOLIC PANEL - Abnormal; Notable for the following:    Sodium 128 (*)    Chloride 97 (*)    Glucose, Bld 661 (*)    Creatinine, Ser 1.55 (*)    GFR calc non Af Amer 47 (*)    GFR calc Af Amer 55 (*)    All other components within normal limits  HEPATIC FUNCTION PANEL - Abnormal; Notable for the following:    Albumin 2.4 (*)    AST 47 (*)    Total Bilirubin 6.5 (*)    Bilirubin, Direct 1.9 (*)    Indirect Bilirubin 4.6 (*)    All other components within normal limits  AMMONIA - Abnormal; Notable for the following:    Ammonia 135 (*)    All other components within normal limits  PROTIME-INR - Abnormal; Notable for the following:    Prothrombin Time 19.0 (*)    INR 1.59 (*)    All other components within normal limits  I-STAT CHEM 8, ED - Abnormal; Notable for the following:    Sodium 130 (*)    Chloride 94 (*)    Creatinine, Ser 1.50 (*)    Glucose, Bld 631 (*)    Hemoglobin 11.6 (*)    HCT 34.0 (*)    All other components within normal limits  CBG MONITORING, ED - Abnormal; Notable for the following:    Glucose-Capillary >600 (*)    All other components within normal limits  CBG MONITORING, ED - Abnormal; Notable for the following:    Glucose-Capillary 585 (*)    All other components within normal limits  TROPONIN  I  BRAIN NATRIURETIC PEPTIDE  TYPE AND SCREEN    Imaging Review Dg Chest Port 1 View  05/16/2016  CLINICAL DATA:  60 year old male with shortness of breath EXAM: PORTABLE CHEST 1 VIEW COMPARISON:  Chest radiograph dated 12/27/2015 FINDINGS: Single portable view of the chest does not demonstrate focal consolidation. There is no pleural effusion or pneumothorax. The cardiac silhouette is within normal limits. No acute osseous pathology. IMPRESSION: No active disease. Electronically Signed   By: Anner Crete M.D.   On: 05/16/2016 00:34   I have personally reviewed and evaluated these images and lab results as part of my medical  decision-making.   EKG Interpretation   Date/Time:  Thursday May 15 2016 23:47:17 EDT Ventricular Rate:  73 PR Interval:    QRS Duration: 132 QT Interval:  421 QTC Calculation: 464 R Axis:   -99 Text Interpretation:  Sinus rhythm RBBB and LAFB No significant change  since last tracing Confirmed by Sirron Francesconi  MD, Ladainian Therien [phone]) on  05/16/2016 2:08:07 AM      MDM   Final diagnoses:  Epistaxis  Hypotension, unspecified  Hyperglycemia  Hepatic encephalopathy ([place])    She presents to the emergency department by ambulance. Patient comes from home with complaints of epistaxis. Patient had onset of bleeding from both sides of his nose earlier tonight at home. Apparently bled for some time. Bleeding had stopped at arrival to the ER, however. Patient does have a history of cirrhosis. He has a history of auto anticoagulation and thrombocytopenia, no significant change from baseline. Hemoglobin is 11.5, baseline appears to be 13.5. This will need to be cycled and watched closely.  Hypotensive at arrival. This is felt to be multifactorial. He did have some blood loss, however, patient's blood sugar is well above 600 at arrival. I suspect he has some dehydration from hyperglycemia, as he has responded to IV fluids. Patient is now normotensive. He was started on glucose stabilizer for his hyperglycemia, as he will not tolerate large amounts of fluids because of congestive heart failure and cirrhosis.  Patient was somewhat listless at arrival. He is awake and oriented, however. Ammonia is 135.  Patient will be admitted to the ICU for further management.  CRITICAL CARE Performed by: Orpah Greek   Total critical care time: 30 minutes  Critical care time was exclusive of separately billable procedures and treating other patients.  Critical care was necessary to treat or prevent imminent or life-threatening deterioration.  Critical care was time spent personally by me on the  following activities: development of treatment plan with patient and/or surrogate as well as nursing, discussions with consultants, evaluation of patient's response to treatment, examination of patient, obtaining history from patient or surrogate, ordering and performing treatments and interventions, ordering and review of laboratory studies, ordering and review of radiographic studies, pulse oximetry and re-evaluation of patient's condition.   I personally performed the services described in this documentation, which was scribed in my presence. The recorded information has been reviewed and is accurate.     Orpah Greek, MD 05/16/16 0205  Orpah Greek, MD 05/16/16 [phone]

## 2016-05-16 ENCOUNTER — Ambulatory Visit: Payer: Medicaid Other | Admitting: Internal Medicine

## 2016-05-16 ENCOUNTER — Encounter: Payer: Medicaid Other | Admitting: Adult Health

## 2016-05-16 ENCOUNTER — Encounter (HOSPITAL_COMMUNITY): Payer: Self-pay | Admitting: Internal Medicine

## 2016-05-16 DIAGNOSIS — K219 Gastro-esophageal reflux disease without esophagitis: Secondary | ICD-10-CM | POA: Diagnosis present

## 2016-05-16 DIAGNOSIS — E870 Hyperosmolality and hypernatremia: Secondary | ICD-10-CM | POA: Diagnosis not present

## 2016-05-16 DIAGNOSIS — Z8719 Personal history of other diseases of the digestive system: Secondary | ICD-10-CM | POA: Diagnosis not present

## 2016-05-16 DIAGNOSIS — K7460 Unspecified cirrhosis of liver: Secondary | ICD-10-CM | POA: Diagnosis present

## 2016-05-16 DIAGNOSIS — G9341 Metabolic encephalopathy: Secondary | ICD-10-CM | POA: Diagnosis present

## 2016-05-16 DIAGNOSIS — N182 Chronic kidney disease, stage 2 (mild): Secondary | ICD-10-CM | POA: Diagnosis present

## 2016-05-16 DIAGNOSIS — E860 Dehydration: Secondary | ICD-10-CM | POA: Diagnosis present

## 2016-05-16 DIAGNOSIS — G4730 Sleep apnea, unspecified: Secondary | ICD-10-CM | POA: Diagnosis present

## 2016-05-16 DIAGNOSIS — G2581 Restless legs syndrome: Secondary | ICD-10-CM | POA: Diagnosis present

## 2016-05-16 DIAGNOSIS — I8500 Esophageal varices without bleeding: Secondary | ICD-10-CM | POA: Diagnosis present

## 2016-05-16 DIAGNOSIS — F17210 Nicotine dependence, cigarettes, uncomplicated: Secondary | ICD-10-CM | POA: Diagnosis present

## 2016-05-16 DIAGNOSIS — I5032 Chronic diastolic (congestive) heart failure: Secondary | ICD-10-CM | POA: Diagnosis present

## 2016-05-16 DIAGNOSIS — I959 Hypotension, unspecified: Secondary | ICD-10-CM | POA: Diagnosis not present

## 2016-05-16 DIAGNOSIS — I130 Hypertensive heart and chronic kidney disease with heart failure and stage 1 through stage 4 chronic kidney disease, or unspecified chronic kidney disease: Secondary | ICD-10-CM | POA: Diagnosis present

## 2016-05-16 DIAGNOSIS — R040 Epistaxis: Secondary | ICD-10-CM | POA: Diagnosis present

## 2016-05-16 DIAGNOSIS — Z794 Long term (current) use of insulin: Secondary | ICD-10-CM | POA: Diagnosis not present

## 2016-05-16 DIAGNOSIS — E861 Hypovolemia: Secondary | ICD-10-CM | POA: Diagnosis present

## 2016-05-16 DIAGNOSIS — E1100 Type 2 diabetes mellitus with hyperosmolarity without nonketotic hyperglycemic-hyperosmolar coma (NKHHC): Secondary | ICD-10-CM | POA: Diagnosis present

## 2016-05-16 DIAGNOSIS — Z8551 Personal history of malignant neoplasm of bladder: Secondary | ICD-10-CM | POA: Diagnosis not present

## 2016-05-16 DIAGNOSIS — E785 Hyperlipidemia, unspecified: Secondary | ICD-10-CM | POA: Diagnosis present

## 2016-05-16 DIAGNOSIS — Z66 Do not resuscitate: Secondary | ICD-10-CM | POA: Diagnosis present

## 2016-05-16 DIAGNOSIS — D696 Thrombocytopenia, unspecified: Secondary | ICD-10-CM | POA: Diagnosis present

## 2016-05-16 DIAGNOSIS — K7290 Hepatic failure, unspecified without coma: Secondary | ICD-10-CM | POA: Diagnosis not present

## 2016-05-16 DIAGNOSIS — Z85828 Personal history of other malignant neoplasm of skin: Secondary | ICD-10-CM | POA: Diagnosis not present

## 2016-05-16 DIAGNOSIS — D62 Acute posthemorrhagic anemia: Secondary | ICD-10-CM | POA: Diagnosis present

## 2016-05-16 DIAGNOSIS — Z886 Allergy status to analgesic agent status: Secondary | ICD-10-CM | POA: Diagnosis not present

## 2016-05-16 DIAGNOSIS — K7581 Nonalcoholic steatohepatitis (NASH): Secondary | ICD-10-CM | POA: Diagnosis present

## 2016-05-16 LAB — BASIC METABOLIC PANEL
Anion gap: 11 (ref 5–15)
Anion gap: 6 (ref 5–15)
Anion gap: 7 (ref 5–15)
Anion gap: 9 (ref 5–15)
BUN: 18 mg/dL (ref 6–20)
BUN: 20 mg/dL (ref 6–20)
BUN: 20 mg/dL (ref 6–20)
BUN: 21 mg/dL — AB (ref 6–20)
CALCIUM: 8.8 mg/dL — AB (ref 8.9–10.3)
CALCIUM: 8.9 mg/dL (ref 8.9–10.3)
CHLORIDE: 100 mmol/L — AB (ref 101–111)
CHLORIDE: 102 mmol/L (ref 101–111)
CHLORIDE: 101 mmol/L (ref 101–111)
CO2: 19 mmol/L — ABNORMAL LOW (ref 22–32)
CO2: 22 mmol/L (ref 22–32)
CO2: 22 mmol/L (ref 22–32)
CO2: 24 mmol/L (ref 22–32)
Calcium: 8.6 mg/dL — ABNORMAL LOW (ref 8.9–10.3)
Calcium: 8.7 mg/dL — ABNORMAL LOW (ref 8.9–10.3)
Chloride: 97 mmol/L — ABNORMAL LOW (ref 101–111)
Creatinine, Ser: 1.15 mg/dL (ref 0.61–1.24)
Creatinine, Ser: 1.15 mg/dL (ref 0.61–1.24)
Creatinine, Ser: 1.38 mg/dL — ABNORMAL HIGH (ref 0.61–1.24)
Creatinine, Ser: 1.55 mg/dL — ABNORMAL HIGH (ref 0.61–1.24)
GFR calc Af Amer: >60 mL/min (ref >60)
GFR calc Af Amer: >60 mL/min (ref >60)
GFR calc Af Amer: >60 mL/min (ref >60)
GFR calc non Af Amer: 54 mL/min — ABNORMAL LOW (ref >60)
GFR calc non Af Amer: >60 mL/min (ref >60)
GFR calc non Af Amer: >60 mL/min (ref >60)
GFR, EST AFRICAN AMERICAN: 55 mL/min — AB (ref >60)
GFR, EST NON AFRICAN AMERICAN: 47 mL/min — AB (ref >60)
GLUCOSE: 171 mg/dL — AB (ref 65–99)
GLUCOSE: 295 mg/dL — AB (ref 65–99)
GLUCOSE: 502 mg/dL — AB (ref 65–99)
GLUCOSE: 661 mg/dL — AB (ref 65–99)
POTASSIUM: 3.8 mmol/L (ref 3.5–5.1)
POTASSIUM: 4.4 mmol/L (ref 3.5–5.1)
POTASSIUM: 4.5 mmol/L (ref 3.5–5.1)
POTASSIUM: 4.5 mmol/L (ref 3.5–5.1)
Sodium: 128 mmol/L — ABNORMAL LOW (ref 135–145)
Sodium: 129 mmol/L — ABNORMAL LOW (ref 135–145)
Sodium: 131 mmol/L — ABNORMAL LOW (ref 135–145)
Sodium: 132 mmol/L — ABNORMAL LOW (ref 135–145)

## 2016-05-16 LAB — CBC WITH DIFFERENTIAL/PLATELET
BASOS ABS: 0 10*3/uL (ref 0.0–0.1)
BASOS PCT: 0 %
BASOS PCT: 0 %
Basophils Absolute: 0 10*3/uL (ref 0.0–0.1)
EOS PCT: 2 %
Eosinophils Absolute: 0.1 10*3/uL (ref 0.0–0.7)
Eosinophils Absolute: 0.2 10*3/uL (ref 0.0–0.7)
Eosinophils Relative: 2 %
HCT: 33 % — ABNORMAL LOW (ref 39.0–52.0)
HEMATOCRIT: 28.9 % — AB (ref 39.0–52.0)
Hemoglobin: 10.5 g/dL — ABNORMAL LOW (ref 13.0–17.0)
Hemoglobin: 11.6 g/dL — ABNORMAL LOW (ref 13.0–17.0)
LYMPHS PCT: 22 %
LYMPHS PCT: 36 %
Lymphs Abs: 1.6 10*3/uL (ref 0.7–4.0)
Lymphs Abs: 3.1 10*3/uL (ref 0.7–4.0)
MCH: 35.2 pg — ABNORMAL HIGH (ref 26.0–34.0)
MCH: 35.7 pg — ABNORMAL HIGH (ref 26.0–34.0)
MCHC: 35.2 g/dL (ref 30.0–36.0)
MCHC: 36.3 g/dL — AB (ref 30.0–36.0)
MCV: 100 fL (ref 78.0–100.0)
MCV: 98.3 fL (ref 78.0–100.0)
MONO ABS: 0.6 10*3/uL (ref 0.1–1.0)
MONOS PCT: 7 %
MONOS PCT: 9 %
Monocytes Absolute: 0.6 10*3/uL (ref 0.1–1.0)
NEUTROS ABS: 4.8 10*3/uL (ref 1.7–7.7)
NEUTROS PCT: 55 %
Neutro Abs: 4.7 10*3/uL (ref 1.7–7.7)
Neutrophils Relative %: 67 %
PLATELETS: 88 10*3/uL — AB (ref 150–400)
Platelets: 64 10*3/uL — ABNORMAL LOW (ref 150–400)
RBC: 2.94 MIL/uL — ABNORMAL LOW (ref 4.22–5.81)
RBC: 3.3 MIL/uL — ABNORMAL LOW (ref 4.22–5.81)
RDW: 13.7 % (ref 11.5–15.5)
RDW: 13.7 % (ref 11.5–15.5)
SMEAR REVIEW: DECREASED
WBC: 7.1 10*3/uL (ref 4.0–10.5)
WBC: 8.6 10*3/uL (ref 4.0–10.5)

## 2016-05-16 LAB — GLUCOSE, CAPILLARY
GLUCOSE-CAPILLARY: 202 mg/dL — AB (ref 65–99)
GLUCOSE-CAPILLARY: 212 mg/dL — AB (ref 65–99)
GLUCOSE-CAPILLARY: 189 mg/dL — AB (ref 65–99)
GLUCOSE-CAPILLARY: 155 mg/dL — AB (ref 65–99)
GLUCOSE-CAPILLARY: 156 mg/dL — AB (ref 65–99)
GLUCOSE-CAPILLARY: 327 mg/dL — AB (ref 65–99)
Glucose-Capillary: 512 mg/dL — ABNORMAL HIGH (ref 65–99)
Glucose-Capillary: 335 mg/dL — ABNORMAL HIGH (ref 65–99)
Glucose-Capillary: 266 mg/dL — ABNORMAL HIGH (ref 65–99)
Glucose-Capillary: 206 mg/dL — ABNORMAL HIGH (ref 65–99)
Glucose-Capillary: 152 mg/dL — ABNORMAL HIGH (ref 65–99)
Glucose-Capillary: 170 mg/dL — ABNORMAL HIGH (ref 65–99)
Glucose-Capillary: 207 mg/dL — ABNORMAL HIGH (ref 65–99)

## 2016-05-16 LAB — TROPONIN I: Troponin I: <0.03 ng/mL (ref <0.031)

## 2016-05-16 LAB — HEPATIC FUNCTION PANEL
ALT: 24 U/L (ref 17–63)
AST: 47 U/L — ABNORMAL HIGH (ref 15–41)
Albumin: 2.4 g/dL — ABNORMAL LOW (ref 3.5–5.0)
Alkaline Phosphatase: 105 U/L (ref 38–126)
BILIRUBIN DIRECT: 1.9 mg/dL — AB (ref 0.1–0.5)
BILIRUBIN INDIRECT: 4.6 mg/dL — AB (ref 0.3–0.9)
Total Bilirubin: 6.5 mg/dL — ABNORMAL HIGH (ref 0.3–1.2)
Total Protein: 6.5 g/dL (ref 6.5–8.1)

## 2016-05-16 LAB — TYPE AND SCREEN
ABO/RH(D): O POS
Antibody Screen: NEGATIVE

## 2016-05-16 LAB — CBG MONITORING, ED
Glucose-Capillary: 585 mg/dL (ref 65–99)
Glucose-Capillary: 582 mg/dL (ref 65–99)

## 2016-05-16 LAB — MRSA PCR SCREENING: MRSA BY PCR: NEGATIVE

## 2016-05-16 LAB — AMMONIA
Ammonia: 107 umol/L — ABNORMAL HIGH (ref 9–35)
Ammonia: 135 umol/L — ABNORMAL HIGH (ref 9–35)

## 2016-05-16 LAB — BRAIN NATRIURETIC PEPTIDE: B Natriuretic Peptide: 52 pg/mL (ref 0.0–100.0)

## 2016-05-16 LAB — TSH: TSH: 1.467 u[IU]/mL (ref 0.350–4.500)

## 2016-05-16 MED ORDER — VITAMIN K1 10 MG/ML IJ SOLN
10.0000 mg | Freq: Once | INTRAMUSCULAR | Status: AC
  Administered 2016-05-16: 10 mg via SUBCUTANEOUS
  Filled 2016-05-16: qty 1

## 2016-05-16 MED ORDER — SODIUM CHLORIDE 0.9 % IV SOLN
INTRAVENOUS | Status: DC
  Administered 2016-05-16: 02:00:00 via INTRAVENOUS

## 2016-05-16 MED ORDER — SODIUM CHLORIDE 0.9 % IV SOLN
INTRAVENOUS | Status: DC
  Administered 2016-05-16: 5.4 [IU]/h via INTRAVENOUS
  Filled 2016-05-16: qty 2.5

## 2016-05-16 MED ORDER — SODIUM CHLORIDE 0.9 % IV SOLN
INTRAVENOUS | Status: AC
  Filled 2016-05-16: qty 2.5

## 2016-05-16 MED ORDER — INSULIN GLARGINE 100 UNIT/ML ~~LOC~~ SOLN
5.0000 [IU] | SUBCUTANEOUS | Status: AC
  Administered 2016-05-16: 5 [IU] via SUBCUTANEOUS
  Filled 2016-05-16: qty 0.05

## 2016-05-16 MED ORDER — INSULIN ASPART 100 UNIT/ML ~~LOC~~ SOLN
0.0000 [IU] | Freq: Three times a day (TID) | SUBCUTANEOUS | Status: DC
  Administered 2016-05-16: 5 [IU] via SUBCUTANEOUS
  Administered 2016-05-17: 8 [IU] via SUBCUTANEOUS
  Administered 2016-05-17: 15 [IU] via SUBCUTANEOUS

## 2016-05-16 MED ORDER — LACTULOSE 10 GM/15ML PO SOLN
30.0000 g | Freq: Three times a day (TID) | ORAL | Status: DC
  Administered 2016-05-16 – 2016-05-17 (×4): 30 g via ORAL
  Filled 2016-05-16 (×5): qty 60

## 2016-05-16 MED ORDER — INSULIN GLARGINE 100 UNIT/ML ~~LOC~~ SOLN
40.0000 [IU] | Freq: Every day | SUBCUTANEOUS | Status: DC
  Administered 2016-05-16: 40 [IU] via SUBCUTANEOUS
  Filled 2016-05-16 (×3): qty 0.4

## 2016-05-16 MED ORDER — RIFAXIMIN 550 MG PO TABS
550.0000 mg | ORAL_TABLET | Freq: Two times a day (BID) | ORAL | Status: DC
Start: 2016-05-16 — End: 2016-05-17
  Administered 2016-05-16 – 2016-05-17 (×3): 550 mg via ORAL
  Filled 2016-05-16 (×3): qty 1

## 2016-05-16 MED ORDER — SODIUM CHLORIDE 0.9% FLUSH
3.0000 mL | Freq: Two times a day (BID) | INTRAVENOUS | Status: DC
  Administered 2016-05-16 (×2): 3 mL via INTRAVENOUS

## 2016-05-16 MED ORDER — SODIUM CHLORIDE 0.9 % IV SOLN
INTRAVENOUS | Status: DC
  Administered 2016-05-16 – 2016-05-17 (×2): via INTRAVENOUS

## 2016-05-16 MED ORDER — INSULIN ASPART 100 UNIT/ML ~~LOC~~ SOLN
4.0000 [IU] | Freq: Three times a day (TID) | SUBCUTANEOUS | Status: DC
  Administered 2016-05-16 – 2016-05-17 (×3): 4 [IU] via SUBCUTANEOUS

## 2016-05-16 MED ORDER — INSULIN ASPART 100 UNIT/ML ~~LOC~~ SOLN
0.0000 [IU] | Freq: Every day | SUBCUTANEOUS | Status: DC
  Administered 2016-05-16: 4 [IU] via SUBCUTANEOUS

## 2016-05-16 MED ORDER — PANTOPRAZOLE SODIUM 40 MG PO TBEC
40.0000 mg | DELAYED_RELEASE_TABLET | Freq: Every day | ORAL | Status: DC
  Administered 2016-05-16 – 2016-05-17 (×2): 40 mg via ORAL
  Filled 2016-05-16 (×2): qty 1

## 2016-05-16 MED ORDER — SODIUM CHLORIDE 0.9 % IV BOLUS (SEPSIS)
500.0000 mL | Freq: Once | INTRAVENOUS | Status: AC
  Administered 2016-05-16: 500 mL via INTRAVENOUS

## 2016-05-16 MED ORDER — DEXTROSE-NACL 5-0.45 % IV SOLN
INTRAVENOUS | Status: DC
  Administered 2016-05-16: 08:00:00 via INTRAVENOUS

## 2016-05-16 NOTE — Progress Notes (Signed)
eLink Physician-Brief Progress Note Patient Name: Kyle [name] DOB: 1956/07/11 MRN: [id number]   Date of Service  05/16/2016  HPI/Events of Note  New patient evaluation. Presented with persistent epistaxis that resolved upon arrival to the emergency department. Patient with known liver cirrhosis status post TIPS. Thrombocytopenia. Patient received IV fluids in the emergency department. Started on insulin drip. Camera check shows patient resting comfortably on his right side. No distress. Vitamin K ordered.   eICU Interventions  Continuing current plan of care per primary service.      Intervention Category Evaluation Type: New Patient Evaluation  Tera Partridge 05/16/2016, 4:24 AM

## 2016-05-16 NOTE — ED Notes (Signed)
CRITICAL VALUE ALERT  Critical value received:  Glucose- 661 mg/dl  Date of notification:  05/16/16  Time of notification:  0023 hrs  Critical value read back:Yes.    Nurse who received alert:  Y. Andriana Casa, RN  MD notified (1st page):  Dr. Betsey Holiday  Time of first page:  0024 hrs  Responding MD:  Dr. Betsey Holiday  Time MD responded:  0026 hrs

## 2016-05-16 NOTE — H&P (Signed)
History and Physical    STANDLY SHAMES [id number] DOB: 1956-06-29 DOA: 05/15/2016  Referring MD/NP/PA: Orpah Greek, MD PCP: Odette Fraction, MD Outpatient Specialists:   Gastroenterology; Daneil Dolin, MD Patient coming from: home  Chief Complaint: Epistaxis and hypotension  HPI: Kyle [name] is a 60 y.o. male with medical history significant of congestive heart failure, HTN, HLD, DM type 2, bladder CA s/p resection, non-alcoholic cirrhosis s/p TIPS procedure, presented via ambulance with complaints of a constant, persistent nosebleed onset two hours prior to arrival in the ED. Patient had been bleeding from both nostrils and his mouth which resolved prior to arrival in the ED. Per EMS, patient was found to be hypotensive  ED Course: While in the ED, though his epistaxis resolved, patient was found to be hyperglycemia and hypotensive. His ammonia level was elevated at 135.  CXR showed no active cardiopulmonary diseases. He was started on fluids and admitted for hepatic encephalopathy, hypovolemia, and coagulopathy.   Review of Systems: As per HPI otherwise 10 point review of systems negative.   Past Medical History  Diagnosis Date  . DM (diabetes mellitus) ([place])   . Cirrhosis ([place])     bx proven steatohepatitis with cirrhosis (2010); per note from Feb 2011, received Hep A and B vaccines in 2010.  [name] HTN (hypertension)   . RLS (restless legs syndrome)   . Hyperlipidemia   . IDA (iron deficiency anemia)   . GERD (gastroesophageal reflux disease)   . Depression   . Peripheral neuropathy ([place])   . Urothelial cancer ([place])     2010, paillary low-grade, h/o recurrence 2011, recurrence 02/2015  . B12 deficiency   . Psoriasis   . 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 Anemia due to multiple mechanisms 05/13/2011  . History of alcohol abuse 05/13/2011  . ANEMIA-IRON DEFICIENCY 03/09/2009  . S/P endoscopy May 2012    4 columns grade II esophageal varices; due  for repeat in Nov 2013   . S/P colonoscopy May 2012    Tubular adenoma  . Low back pain     possible bulging disc, bone scan   . Cancer ([place])     bladder ca 05/2009 removal and  w/chemo wash  . Esophageal varices with bleeding(456.0) 11/24/11    s/p emergent EGD 11/25/11 by Dr. Hilarie Fredrickson at [facility], Grade III esophageal varices s/p banding X 5  . Small bowel obstruction ([place]) 02/15/2012    Admitted to [facility], managed by Dr. Geroge Baseman, ventral hernia manually reduced  . Ventral hernia 02/15/12  . MRSA (methicillin resistant Staphylococcus aureus)   . Acute post-ligation esophageal ulcer with hemorrhage 04/11/2012  . Clostridium difficile infection   . H. pylori infection 12/24/2012  . Sleep apnea     [facility] took him off CPAP when had tips procedure  . Sleep apnea   . Cellulitis 04/2014    right upper leg  . Iron deficiency anemia secondary to blood loss (chronic) 03/09/2009    Secondary to GI blood loss- Rectal and esophageal varices, portal gastropathy, and esophageal ulcers    . Bladder cancer ([place])   . Chronic diastolic CHF (congestive heart failure) ([place]) 03/21/2016    01/2016 echo: EF 60-65%; LVH; grade 2 diastolic dysfunction.  . CKD (chronic kidney disease), stage III 03/21/2016  . DKA, type 2 ([place]) 03/19/2016    Past Surgical History  Procedure Laterality Date  . Carpel tunnel Right   . Shoulder surgery Left   . Adrenal mass surgery  05/2009  benign, left  . Bladder surgery  01/2009 and 06/2010    cancer 2010, small recurrence in 06/2010  . Colonoscopy  04/2009    moderate int hemorrhoids, rare sigmoid diverticula, one mm sessile hyperplastic rectal polyp  . Esophagogastroduodenoscopy  03/2009    small hh  . Small bowel capsule endoscopy  03/2009    couple of small benign appearing erosions, nonbleeding  . Egd/tcs  08/2007    small hiatal hernia, pancolonic diverticula, friable anal canal, 3cm salmon colored epithelium in distal esophagus, bx negative for Barrett's  . Skin cancer excision  Oct  2012    left arm  . Esophagogastroduodenoscopy  11/25/2011    Dr. Phineas Douglas III varices in the mid and distal esophagus, banding placed, portal astropathy  . Esophagogastroduodenoscopy  01/14/2012    Dr Rourk->4-5 columns Gr2 varices, 6 bands placed, HH, distal esophageal ulcer, portal gastropathy, antral erosions  . Esophagogastroduodenoscopy  03/17/2012    Dr. Larina Bras varices status post band ligation. Hiatal hernia. Portal gastropathy.  . Umbilical hernia repair  04/05/2012    Procedure: HERNIA REPAIR UMBILICAL ADULT;  Surgeon: Donato Heinz, MD;  Location: [facility];  Service: General;  Laterality: N/A;  . Esophagogastroduodenoscopy  04/11/2012    Dr. Bridgett Larsson ulcer possibly at previous banding sites with stigmata     of bleeding.  Attempt at hemostasis with hemoclip and banding was not successful resulting in recurrence of bleed/  Portal gastropathy, but no evidence of gastric varices/ Recurrent esophageal varices grade 2.  . Esophagogastroduodenoscopy  05/24/2012    Dr. Larina Bras varices, portal gastropathy  . Esophagogastroduodenoscopy  04/11/12    Dr. Drue Novel in the distal esophagus adherent lot and visible vessel treated with bicap. grade I varices in the distal esophagus, one ligating band placed, portal hypertensive gastropathy in the body of the stomach.  . Colonoscopy  09/29/2012    [id number] varices. Scattered pancolonic diverticula/single cecal polyp-removed as described above  . Esophagogastroduodenoscopy  12/24/2012    Dr. Lynwood Dawley significant upper GI bleed secondary to bleeding esophageal varices. s/p hemostasis therapy with injection of a total of 80mL of 5% ethanolamine and application of 9 bands, complete egd not carried out.  . Schlerotherapy  12/24/2012    Procedure: Woodward Ku OF VARICES;  Surgeon: Daneil Dolin, MD;  Location: [facility];  Service: Endoscopy;  Laterality: N/A;  . Esophageal banding  12/24/2012     [id number] significant upper GI bleed secondary to bleeding esophageal varices  . Tips procedure  12/2012    [facility]  . Cataract extraction, bilateral    . Hernia repair      umbilical  . Eye surgery      bilateral cataract surgery with lens implants  . Transurethral resection of bladder tumor N/A 03/12/2015    Procedure: TRANSURETHRAL RESECTION OF BLADDER TUMOR (TURBT);  Surgeon: Raynelle Bring, MD;  Location: [facility];  Service: Urology;  Laterality: N/A;  . Cystoscopy w/ ureteral stent placement Bilateral 03/12/2015    Procedure: CYSTOSCOPY WITH BILATERAL RETROGRADE PYELOGRAM/RIGHT URETEROSCOPY;  Surgeon: Raynelle Bring, MD;  Location: [facility];  Service: Urology;  Laterality: Bilateral;  . Circumcision N/A 03/12/2015    Procedure: CIRCUMCISION ADULT;  Surgeon: Raynelle Bring, MD;  Location: [facility];  Service: Urology;  Laterality: N/A;     reports that he has been smoking Cigarettes.  He has a 30 pack-year smoking history. He has never used smokeless tobacco. He reports that he does not drink alcohol or use illicit drugs.  Allergies  Allergen Reactions  .  Aspirin Other (See Comments)    Harms liver.   . Ibuprofen Other (See Comments)    Can't take per Dr Gala Romney- harms livers  . Tylenol [Acetaminophen] Other (See Comments)    Restless Legs     Family History  Problem Relation Age of Onset  . Cirrhosis Father     etoh  . Colon cancer Neg Hx   . Anesthesia problems Neg Hx   . Hypotension Neg Hx   . Malignant hyperthermia Neg Hx   . Pseudochol deficiency Neg Hx   . Kidney cancer Mother   . Cancer Mother   . HIV Brother   . Cirrhosis Brother     nash    Prior to Admission medications   Medication Sig Start Date End Date Taking? Authorizing Provider  ACCU-CHEK AVIVA PLUS test strip USE TEST STRIPS TO CHECK BLOOD SUGAR THREE TIMES DAILY AS DIRECTED 12/17/15   Orlena Sheldon, PA-C  furosemide (LASIX) 40 MG tablet Dose has been decreased to 1 tablet daily. You may need to restart 2  tablets daily, but this decision will be made by your primary physician or gastroenterologist or cardiologist. 03/21/16   Rexene Alberts, MD  Insulin Pen Needle 29G X 12.7MM MISC Use with Nelva Nay solostar 09/10/15   Susy Frizzle, MD  insulin regular human CONCENTRATED (HUMULIN R) 500 UNIT/ML injection Inject 0.6 mLs (300 Units total) into the skin 3 (three) times daily. 04/25/16   Susy Frizzle, MD  Insulin Syringes, Disposable, (B-D INSULIN SYRINGE 1CC) U-100 1 ML MISC Inject 60 Units into the skin 3 (three) times daily. 04/16/16   Susy Frizzle, MD  lactulose (CHRONULAC) 10 GM/15ML solution Take 45 mLs (30 g total) by mouth 3 (three) times daily. 03/21/16   Rexene Alberts, MD  metoCLOPramide (REGLAN) 10 MG tablet Take 1 tablet (10 mg total) by mouth 3 (three) times daily before meals. 04/25/16   Susy Frizzle, MD  metolazone (ZAROXOLYN) 2.5 MG tablet Take twice weekly only as needed for increase in leg swelling. 03/21/16   Rexene Alberts, MD  mupirocin ointment (BACTROBAN) 2 % Place 1 application into the nose 2 (two) times daily. 05/08/16   Susy Frizzle, MD  omeprazole (PRILOSEC) 40 MG capsule TAKE ONE CAPSULE BY MOUTH ONCE DAILY 12/24/15   Carlis Stable, NP  oxyCODONE (ROXICODONE) 5 MG immediate release tablet Take 1 tablet (5 mg total) by mouth every 4 (four) hours as needed for severe pain. 07/02/15   Tanna Furry, MD  potassium chloride 20 MEQ TBCR Take 20 mEq by mouth 2 (two) times daily. 07/02/15   Tanna Furry, MD  propranolol (INDERAL) 20 MG tablet TAKE ONE TABLET BY MOUTH TWICE DAILY 03/03/16   Orvil Feil, NP  spironolactone (ALDACTONE) 50 MG tablet Take 50 mg by mouth 2 (two) times daily.  03/03/14   Historical Provider, MD  Suvorexant (BELSOMRA) 10 MG TABS Take by mouth.    Historical Provider, MD  TOUJEO SOLOSTAR 300 UNIT/ML SOPN INJECT 40 UNITS SUBCUTANEOUSLY DAILY -- TAKE WITH HUMULIN R AND RECHECK FASTING BLOOD SUGAR AND 2 HOUR AFTER EATING IN 2 WEEKS. Patient taking differently: INJECT 20  UNITS SUBCUTANEOUSLY DAILY -- TAKE WITH HUMULIN R AND RECHECK FASTING BLOOD SUGAR AND 2 HOUR AFTER EATING IN 2 WEEKS. 05/02/16   Susy Frizzle, MD  triamcinolone cream (KENALOG) 0.1 % Apply 1 application topically 2 (two) times daily. 07/02/15   Tanna Furry, MD  XIFAXAN 550 MG TABS tablet TAKE ONE TABLET  BY MOUTH TWICE DAILY 03/03/16   Orvil Feil, NP    Physical Exam: Filed Vitals:   05/16/16 0030 05/16/16 0040 05/16/16 0100 05/16/16 0110  BP: 85/49 86/53 104/56 105/56  Pulse: 70 69 67 66  Temp:      TempSrc:      Resp: 15 13 18 19   Height:      Weight:      SpO2: 91% 95% 96% 97%    Constitutional: NAD, calm, comfortable Eyes: PERRL, lids and conjunctivae normal.  No jaundice.  ENMT: Mucous membranes are moist. Posterior pharynx clear of any exudate or lesions.Normal dentition.  Neck: normal, supple, no masses, no thyromegaly Respiratory: clear to auscultation bilaterally, no wheezing, no crackles. Normal respiratory effort. No accessory muscle use.  Cardiovascular: Regular rate and rhythm, no murmurs / rubs / gallops. No extremity edema. 2+ pedal pulses. No carotid bruits.  Abdomen: no tenderness, no masses palpated. No hepatosplenomegaly. Bowel sounds positive.  Musculoskeletal: no clubbing / cyanosis. No joint deformity upper and lower extremities. Good ROM, no contractures. Normal muscle tone.  Skin: no rashes, lesions, ulcers. No induration Neurologic: CN 2-12 grossly intact. Sensation intact, DTR normal. Strength 5/5 in all 4.  Psychiatric: Normal judgment and insight. Alert and oriented x 3. Normal mood.   Labs on Admission: I have personally reviewed following labs and imaging studies  CBC:  Recent Labs Lab 05/15/16 2345 05/15/16 2346  WBC 8.6  --   NEUTROABS 4.7  --   HGB 11.6* 11.6*  HCT 33.0* 34.0*  MCV 100.0  --   PLT 88*  --    Basic Metabolic Panel:  Recent Labs Lab 05/15/16 2345 05/15/16 2346  NA 128* 130*  K 4.5 4.7  CL 97* 94*  CO2 22  --     GLUCOSE 661* 631*  BUN 20 20  CREATININE 1.55* 1.50*  CALCIUM 8.9  --    Liver Function Tests:  Recent Labs Lab 05/15/16 2345  AST 47*  ALT 24  ALKPHOS 105  BILITOT 6.5*  PROT 6.5  ALBUMIN 2.4*    Recent Labs Lab 05/15/16 2345  AMMONIA 135*   Coagulation Profile:  Recent Labs Lab 05/15/16 2345  INR 1.59*   Cardiac Enzymes:  Recent Labs Lab 05/15/16 2345  TROPONINI <0.03   CBG:  Recent Labs Lab 05/16/16 0018  GLUCAP >600*   Urine analysis:    Component Value Date/Time   COLORURINE YELLOW 03/19/2016 1720   APPEARANCEUR CLEAR 03/19/2016 1720   LABSPEC 1.015 03/19/2016 1720   PHURINE 5.5 03/19/2016 1720   GLUCOSEU >1000* 03/19/2016 1720   HGBUR NEGATIVE 03/19/2016 1720   HGBUR trace-intact 03/09/2009 1039   BILIRUBINUR NEGATIVE 03/19/2016 1720   KETONESUR NEGATIVE 03/19/2016 1720   PROTEINUR NEGATIVE 03/19/2016 1720   UROBILINOGEN 0.2 07/02/2015 1305   NITRITE NEGATIVE 03/19/2016 1720   LEUKOCYTESUR NEGATIVE 03/19/2016 1720   Radiological Exams on Admission: Dg Chest Port 1 View  05/16/2016  CLINICAL DATA:  60 year old male with shortness of breath EXAM: PORTABLE CHEST 1 VIEW COMPARISON:  Chest radiograph dated 12/27/2015 FINDINGS: Single portable view of the chest does not demonstrate focal consolidation. There is no pleural effusion or pneumothorax. The cardiac silhouette is within normal limits. No acute osseous pathology. IMPRESSION: No active disease. Electronically Signed   By: Anner Crete M.D.   On: 05/16/2016 00:34    EKG: Independently reviewed. SR, RBBB  Assessment/Plan Principal Problem:   Hyperosmolar syndrome Active Problems:   Esophageal varices (HCC)   DKA,  type 2 (HCC)   Hepatic encephalopathy syndrome (HCC)   Chronic diastolic CHF (congestive heart failure) (HCC)   Dehydration   Epistaxis   1          Hepatic encephalopathy. Secondary to non-alcoholic hepatic cirrhosis. S/p TIPS procedure. Ammonia 135. Will continue  with Lactulose and Xifaxim. 1. Hyperosmolar nonketotic hyperglycemia. Serum glucose 631. Will start the patient on SSI and give IVFs.  Insulin drip as I don't believe he can tolerate the amount of IVF required.  2. Acute on CKD stage 2. Secondary to hypovolemia. Cr elevated at 1.5 Anticipate improvement with IVFs. Continue to follow. 3. Dm type 2. Serum glucose elevated at 631 is indicative of hyperosmolar. Continue with insulin drip as he may not be able to tolerate significant IVF.  4. Acute blood loss anemia. No evidence of current bleeding. Baseline Hgb approximately 13.5, currently 11.6. Continue to monitor and transfuse as necessary.  5. Thrombocytopenia. Supra-therapeutic INR. Will give Vitamin K in case it is deficient, but suspect it was due to cirrhosis.  6. Chronic diastolic CHF. EF 60-65%. Appears compensated at this point. Troponin and BNP negative. Patient is already on a beta blocker.  7. GERD. Will start the patient on PPI.  8. HLD. Continue statins. 9. HTN. Hold antihypertensives in the setting of hypotension.   DVT prophylaxis: SCDs Code Status: DNR Family Communication: discussed with wife present at bedside Disposition Plan: Admit to ICU. Discharge home once improved Consults called: none Admission status: Admit as inpatient  Orvan Falconer, MD FACP [facility]  If 7PM-7AM, please contact night-coverage [email or web address] Password [email or web address]  05/16/2016, 1:20 AM   By signing my name below, I, Delene Ruffini, attest that this documentation has been prepared under the direction and in the presence of Orvan Falconer, MD. Electronically Signed: Delene Ruffini, Scribe 05/16/2016 1:20am

## 2016-05-16 NOTE — Progress Notes (Signed)
Patient seen and examined, database reviewed, discussed with significant other at bedside. All questions answered. Came to the hospital due to a nosebleed, found to be hypotensive and somewhat altered, found to have hyperosmolar nonketotic state. We're currently transitioning him off insulin drip today. Anticipate discharge home over the next 24-48 hours.  Domingo Mend, MD [facility] Pager: [phone]'

## 2016-05-16 NOTE — Progress Notes (Signed)
Inpatient Diabetes Program Recommendations  AACE/ADA: New Consensus Statement on Inpatient Glycemic Control (2015)  Target Ranges:  Prepandial:   less than 140 mg/dL      Peak postprandial:   less than 180 mg/dL (1-2 hours)      Critically ill patients:  140 - 180 mg/dL   Results for Kyle [name], Kyle [name] (MRN [id number]) as of 05/16/2016 14:15  Ref. Range 05/15/2016 23:45  Sodium Latest Ref Range: 135-145 mmol/L 128 (L)  Potassium Latest Ref Range: 3.5-5.1 mmol/L 4.5  Chloride Latest Ref Range: 101-111 mmol/L 97 (L)  CO2 Latest Ref Range: 22-32 mmol/L 22  BUN Latest Ref Range: 6-20 mg/dL 20  Creatinine Latest Ref Range: 0.61-1.24 mg/dL 1.55 (H)  Calcium Latest Ref Range: 8.9-10.3 mg/dL 8.9  EGFR (Non-African Amer.) Latest Ref Range: >60 mL/min 47 (L)  EGFR (African American) Latest Ref Range: >60 mL/min 55 (L)  Glucose Latest Ref Range: 65-99 mg/dL 661 (HH)  Anion gap Latest Ref Range: 5-15  9    Admit with: Hepatic Encephalopathy/ Epistaxis/ Hyperglycemia  History: DM, CHF, Cirrhosis  Home DM Meds: Toujeo Insulin- 100 units bid        U-500 Insulin- 0.6 ml (300 units) TIDWC??       Humalog 200 units tidwc  Current Insulin Orders: IV Insulin drip    -Through Chart Review, saw that patient visited with his PCP (Dr. Jenna Luo with [facility]) on 05/08/16.  In those notes it states that patient is supposed to be taking Toujeo insulin 100 units bid + Humalog 200 units tidwc.  Previously, patient was taking U-500 insulin, however, pharmacy would not fill the U-500 insulin Rx back on 04/25/16 so it looks as if patient is taking Humalog 200 units tidwc.  -Called Dr. Samella Parr office to clarify home insulin regimen.  They were unable to provide concrete clarification of home insulin doses.  RN at Dr. Samella Parr office told me that patient has been referred back to Dr. Delrae Rend with [facility] for further DM follow-up/management.  -Note patient currently on IV  insulin drip.  When ready to transition off IV insulin drip, please consider the following weight based dosing SQ insulin regimen:   1. Start Lantus 45 units daily (0.4 units/kg dosing)- Give dose 1 hour before IV Insulin drip stopped  2. Start Novolog Resistant Correction Scale/ SSI (0-20 units) TID AC + HS  3. Start Novolog Meal Coverage- Novolog 15 units tidwc (hold if pt eats <50% of meal)     --Will follow patient during hospitalization--  Wyn Quaker RN, MSN, CDE Diabetes Coordinator Inpatient Glycemic Control Team Team Pager: [phone] (8a-5p)

## 2016-05-17 DIAGNOSIS — I5032 Chronic diastolic (congestive) heart failure: Secondary | ICD-10-CM

## 2016-05-17 LAB — GLUCOSE, CAPILLARY
Glucose-Capillary: 282 mg/dL — ABNORMAL HIGH (ref 65–99)
Glucose-Capillary: 358 mg/dL — ABNORMAL HIGH (ref 65–99)

## 2016-05-17 MED ORDER — PROPRANOLOL HCL ER 60 MG PO CP24
ORAL_CAPSULE | ORAL | Status: AC
  Filled 2016-05-17: qty 1

## 2016-05-17 MED ORDER — PROPRANOLOL HCL 20 MG PO TABS
20.0000 mg | ORAL_TABLET | Freq: Two times a day (BID) | ORAL | Status: DC
  Administered 2016-05-17: 20 mg via ORAL
  Filled 2016-05-17: qty 1

## 2016-05-17 MED ORDER — PROPRANOLOL HCL ER 60 MG PO CP24
60.0000 mg | ORAL_CAPSULE | Freq: Every day | ORAL | Status: DC
  Administered 2016-05-17: 60 mg via ORAL
  Filled 2016-05-17 (×3): qty 1

## 2016-05-17 MED ORDER — SPIRONOLACTONE 25 MG PO TABS
50.0000 mg | ORAL_TABLET | Freq: Two times a day (BID) | ORAL | Status: DC
  Administered 2016-05-17: 50 mg via ORAL
  Filled 2016-05-17: qty 2

## 2016-05-17 MED ORDER — OXYCODONE-ACETAMINOPHEN 5-325 MG PO TABS
1.0000 | ORAL_TABLET | ORAL | Status: DC | PRN
  Administered 2016-05-17: 1 via ORAL
  Filled 2016-05-17: qty 1

## 2016-05-17 MED ORDER — INSULIN GLARGINE 300 UNIT/ML ~~LOC~~ SOPN: 25.0000 [IU] | PEN_INJECTOR | Freq: Two times a day (BID) | SUBCUTANEOUS | Status: AC

## 2016-05-17 NOTE — Progress Notes (Signed)
1338 d/c instructions and paperwork given to patient and patients wife. Patient aware to pick up insulin Rx sent to [facility] in [place]. Telemetry wiring and monitor removed from patient and central telemetry [facility]) made aware. IV catheter removed from LEFT hand, intact, no s/s of infection noted, patient tolerated well. Patient assisted to vehicle by staff via w/c.

## 2016-05-17 NOTE — Progress Notes (Signed)
Notified MD, pt's blood pressure elevated. Awaiting response still.

## 2016-05-17 NOTE — Progress Notes (Signed)
Notified MD, pt complains of back pain. Takes pain medicine (Roxicodone) per home med list. Awaiting response.

## 2016-05-17 NOTE — Progress Notes (Signed)
Paged MD second time regarding pt's pain medication. Awaiting response.

## 2016-05-17 NOTE — Discharge Summary (Signed)
Physician Discharge Summary  EMER KAN [id number] DOB: 21-Jun-1956 DOA: 05/15/2016  PCP: Kyle Fraction, MD  Admit date: 05/15/2016 Discharge date: 05/17/2016  Time spent: 45 minutes  Recommendations for Outpatient Follow-up:  -Will be discharged home today. -Advised to follow-up with primary care provider in one week continue diabetic management.   Discharge Diagnoses:  Principal Problem:   Hyperosmolar syndrome Active Problems:   Esophageal varices (HCC)   DKA, type 2 (HCC)   Hepatic encephalopathy syndrome (HCC)   Chronic diastolic CHF (congestive heart failure) ([place])   Dehydration   Epistaxis   Discharge Condition: Stable and improved  Filed Weights   05/16/16 0500 05/16/16 1831 05/17/16 0555  Weight: 113.7 kg (250 lb 10.6 oz) 115.667 kg (255 lb) 114.987 kg (253 lb 8 oz)    History of present illness:  As per Dr. Marin Comment on 6/23: Kyle [name] is a 60 y.o. male with medical history significant of congestive heart failure, HTN, HLD, DM type 2, bladder CA s/p resection, non-alcoholic cirrhosis s/p TIPS procedure, presented via ambulance with complaints of a constant, persistent nosebleed onset two hours prior to arrival in the ED. Patient had been bleeding from both nostrils and his mouth which resolved prior to arrival in the ED. Per EMS, patient was found to be hypotensive  ED Course: While in the ED, though his epistaxis resolved, patient was found to be hyperglycemia and hypotensive. His ammonia level was elevated at 135. CXR showed no active cardiopulmonary diseases. He was started on fluids and admitted for hepatic encephalopathy, hypovolemia, and coagulopathy.   Hospital Course:   Acute encephalopathy -Believed mainly due to hyperosmolar nonketotic state, although hepatic encephalopathy might have been playing a role as well. Has been continued on lactulose and rifaximin. -Patient is currently back to baseline, wife agrees.  Hyperosmolar nonketotic  hyperglycemia -On admission glucose was 631, was placed on insulin drip. -Has since been transitioned, CBGs remain suboptimally controlled. Plan to increase long-acting insulin from 40-50 units daily, will need close follow-up with PCP.  Procedures:  None   Consultations:  None  Discharge Instructions  Discharge Instructions    Diet - low sodium heart healthy    Complete by:  As directed      Increase activity slowly    Complete by:  As directed             Medication List    STOP taking these medications        furosemide 40 MG tablet  Commonly known as:  LASIX     metolazone 2.5 MG tablet  Commonly known as:  ZAROXOLYN      TAKE these medications        ACCU-CHEK AVIVA PLUS test strip  Generic drug:  glucose blood  USE TEST STRIPS TO CHECK BLOOD SUGAR THREE TIMES DAILY AS DIRECTED     Insulin Glargine 300 UNIT/ML Sopn  Commonly known as:  TOUJEO SOLOSTAR  Inject 25 Units into the skin 2 (two) times daily.     Insulin Pen Needle 29G X 12.7MM Misc  Use with Toujeo solostar     insulin regular human CONCENTRATED 500 UNIT/ML injection  Commonly known as:  HUMULIN R  Inject 0.6 mLs (300 Units total) into the skin 3 (three) times daily.     Insulin Syringes (Disposable) U-100 1 ML Misc  Commonly known as:  B-D INSULIN SYRINGE 1CC  Inject 60 Units into the skin 3 (three) times daily.     lactulose 10  GM/15ML solution  Commonly known as:  CHRONULAC  Take 45 mLs (30 g total) by mouth 3 (three) times daily.     metoCLOPramide 10 MG tablet  Commonly known as:  REGLAN  Take 1 tablet (10 mg total) by mouth 3 (three) times daily before meals.     omeprazole 40 MG capsule  Commonly known as:  PRILOSEC  TAKE ONE CAPSULE BY MOUTH ONCE DAILY     oxyCODONE 5 MG immediate release tablet  Commonly known as:  ROXICODONE  Take 1 tablet (5 mg total) by mouth every 4 (four) hours as needed for severe pain.     propranolol 20 MG tablet  Commonly known as:  INDERAL   TAKE ONE TABLET BY MOUTH TWICE DAILY     spironolactone 50 MG tablet  Commonly known as:  ALDACTONE  Take 50 mg by mouth 2 (two) times daily.     XIFAXAN 550 MG Tabs tablet  Generic drug:  rifaximin  TAKE ONE TABLET BY MOUTH TWICE DAILY       Allergies  Allergen Reactions  . Aspirin Other (See Comments)    Harms liver.   . Ibuprofen Other (See Comments)    Can't take per Dr Gala Romney- harms livers  . Tylenol [Acetaminophen] Other (See Comments)    Restless Legs        Follow-up Information    Follow up with [facility] TOM, MD. Schedule an appointment as soon as possible for a visit in 1 week.   Specialty:  Family Medicine   Contact information:   [address] [place] [address] [phone]        The results of significant diagnostics from this hospitalization (including imaging, microbiology, ancillary and laboratory) are listed below for reference.    Significant Diagnostic Studies: Dg Chest Port 1 View  05/16/2016  CLINICAL DATA:  60 year old male with shortness of breath EXAM: PORTABLE CHEST 1 VIEW COMPARISON:  Chest radiograph dated 12/27/2015 FINDINGS: Single portable view of the chest does not demonstrate focal consolidation. There is no pleural effusion or pneumothorax. The cardiac silhouette is within normal limits. No acute osseous pathology. IMPRESSION: No active disease. Electronically Signed   By: Anner Crete M.D.   On: 05/16/2016 00:34    Microbiology: Recent Results (from the past 240 hour(s))  MRSA PCR Screening     Status: None   Collection Time: 05/16/16  3:00 AM  Result Value Ref Range Status   MRSA by PCR NEGATIVE NEGATIVE Final    Comment:        The GeneXpert MRSA Assay (FDA approved for NASAL specimens only), is one component of a comprehensive MRSA colonization surveillance program. It is not intended to diagnose MRSA infection nor to guide or monitor treatment for MRSA infections.      Labs: Basic Metabolic  Panel:  Recent Labs Lab 05/15/16 2345 05/15/16 2346 05/16/16 0320 05/16/16 1122 05/16/16 1912  NA 128* 130* 129* 132* 131*  K 4.5 4.7 4.5 3.8 4.4  CL 97* 94* 100* 102 101  CO2 22  --  22 24 19*  GLUCOSE 661* 631* 502* 171* 295*  BUN 20 20 21* 20 18  CREATININE 1.55* 1.50* 1.38* 1.15 1.15  CALCIUM 8.9  --  8.6* 8.7* 8.8*   Liver Function Tests:  Recent Labs Lab 05/15/16 2345  AST 47*  ALT 24  ALKPHOS 105  BILITOT 6.5*  PROT 6.5  ALBUMIN 2.4*   No results for input(s): LIPASE, AMYLASE in the  last 168 hours.  Recent Labs Lab 05/15/16 2345 05/16/16 0320  AMMONIA 135* 107*   CBC:  Recent Labs Lab 05/15/16 2345 05/15/16 2346 05/16/16 0320  WBC 8.6  --  7.1  NEUTROABS 4.7  --  4.8  HGB 11.6* 11.6* 10.5*  HCT 33.0* 34.0* 28.9*  MCV 100.0  --  98.3  PLT 88*  --  64*   Cardiac Enzymes:  Recent Labs Lab 05/15/16 2345  TROPONINI <0.03   BNP: BNP (last 3 results)  Recent Labs  12/27/15 1900 03/05/16 1221 05/15/16 2345  BNP 126.0* 63.7 52.0    ProBNP (last 3 results) No results for input(s): PROBNP in the last 8760 hours.  CBG:  Recent Labs Lab 05/16/16 1549 05/16/16 1726 05/16/16 2129 05/17/16 0735 05/17/16 1137  GLUCAP 156* 207* 327* 282* 358*       Signed:  HERNANDEZ ACOSTA,Remingtyn Depaola  [facility] Pager: [phone] 05/17/2016, 4:15 PM

## 2016-05-21 ENCOUNTER — Other Ambulatory Visit: Payer: Self-pay | Admitting: Family Medicine

## 2016-05-21 MED ORDER — FUROSEMIDE 40 MG PO TABS
80.0000 mg | ORAL_TABLET | Freq: Every day | ORAL | Status: AC
Start: 2016-05-21 — End: ?

## 2016-05-21 NOTE — Telephone Encounter (Signed)
Medication called/sent to requested pharmacy  

## 2016-05-24 ENCOUNTER — Encounter (HOSPITAL_COMMUNITY): Payer: Self-pay | Admitting: *Deleted

## 2016-05-24 ENCOUNTER — Inpatient Hospital Stay (HOSPITAL_COMMUNITY)
Admission: EM | Admit: 2016-05-24 | Discharge: 2016-06-24 | DRG: 813 | Disposition: E | Payer: Medicaid Other | Attending: Pulmonary Disease | Admitting: Pulmonary Disease

## 2016-05-24 DIAGNOSIS — Z9842 Cataract extraction status, left eye: Secondary | ICD-10-CM

## 2016-05-24 DIAGNOSIS — I248 Other forms of acute ischemic heart disease: Secondary | ICD-10-CM | POA: Diagnosis present

## 2016-05-24 DIAGNOSIS — I130 Hypertensive heart and chronic kidney disease with heart failure and stage 1 through stage 4 chronic kidney disease, or unspecified chronic kidney disease: Secondary | ICD-10-CM | POA: Diagnosis present

## 2016-05-24 DIAGNOSIS — K7460 Unspecified cirrhosis of liver: Secondary | ICD-10-CM | POA: Diagnosis present

## 2016-05-24 DIAGNOSIS — D6959 Other secondary thrombocytopenia: Secondary | ICD-10-CM | POA: Diagnosis present

## 2016-05-24 DIAGNOSIS — R040 Epistaxis: Secondary | ICD-10-CM | POA: Diagnosis present

## 2016-05-24 DIAGNOSIS — F17210 Nicotine dependence, cigarettes, uncomplicated: Secondary | ICD-10-CM | POA: Diagnosis present

## 2016-05-24 DIAGNOSIS — J9601 Acute respiratory failure with hypoxia: Secondary | ICD-10-CM | POA: Insufficient documentation

## 2016-05-24 DIAGNOSIS — Z8551 Personal history of malignant neoplasm of bladder: Secondary | ICD-10-CM

## 2016-05-24 DIAGNOSIS — E872 Acidosis, unspecified: Secondary | ICD-10-CM | POA: Insufficient documentation

## 2016-05-24 DIAGNOSIS — Z6841 Body Mass Index (BMI) 40.0 and over, adult: Secondary | ICD-10-CM

## 2016-05-24 DIAGNOSIS — R578 Other shock: Secondary | ICD-10-CM | POA: Diagnosis not present

## 2016-05-24 DIAGNOSIS — N183 Chronic kidney disease, stage 3 (moderate): Secondary | ICD-10-CM | POA: Diagnosis present

## 2016-05-24 DIAGNOSIS — I469 Cardiac arrest, cause unspecified: Secondary | ICD-10-CM | POA: Diagnosis not present

## 2016-05-24 DIAGNOSIS — Z978 Presence of other specified devices: Secondary | ICD-10-CM

## 2016-05-24 DIAGNOSIS — Z8719 Personal history of other diseases of the digestive system: Secondary | ICD-10-CM

## 2016-05-24 DIAGNOSIS — Z9841 Cataract extraction status, right eye: Secondary | ICD-10-CM

## 2016-05-24 DIAGNOSIS — IMO0002 Reserved for concepts with insufficient information to code with codable children: Secondary | ICD-10-CM | POA: Diagnosis present

## 2016-05-24 DIAGNOSIS — D731 Hypersplenism: Secondary | ICD-10-CM | POA: Diagnosis present

## 2016-05-24 DIAGNOSIS — G2581 Restless legs syndrome: Secondary | ICD-10-CM | POA: Diagnosis present

## 2016-05-24 DIAGNOSIS — K7290 Hepatic failure, unspecified without coma: Secondary | ICD-10-CM | POA: Diagnosis present

## 2016-05-24 DIAGNOSIS — G629 Polyneuropathy, unspecified: Secondary | ICD-10-CM | POA: Diagnosis present

## 2016-05-24 DIAGNOSIS — I10 Essential (primary) hypertension: Secondary | ICD-10-CM | POA: Diagnosis present

## 2016-05-24 DIAGNOSIS — F329 Major depressive disorder, single episode, unspecified: Secondary | ICD-10-CM | POA: Diagnosis present

## 2016-05-24 DIAGNOSIS — E1165 Type 2 diabetes mellitus with hyperglycemia: Secondary | ICD-10-CM | POA: Diagnosis present

## 2016-05-24 DIAGNOSIS — Z794 Long term (current) use of insulin: Secondary | ICD-10-CM

## 2016-05-24 DIAGNOSIS — K2211 Ulcer of esophagus with bleeding: Secondary | ICD-10-CM | POA: Diagnosis present

## 2016-05-24 DIAGNOSIS — E1129 Type 2 diabetes mellitus with other diabetic kidney complication: Secondary | ICD-10-CM | POA: Diagnosis present

## 2016-05-24 DIAGNOSIS — G4733 Obstructive sleep apnea (adult) (pediatric): Secondary | ICD-10-CM | POA: Diagnosis present

## 2016-05-24 DIAGNOSIS — E1122 Type 2 diabetes mellitus with diabetic chronic kidney disease: Secondary | ICD-10-CM | POA: Diagnosis present

## 2016-05-24 DIAGNOSIS — K7581 Nonalcoholic steatohepatitis (NASH): Secondary | ICD-10-CM | POA: Diagnosis present

## 2016-05-24 DIAGNOSIS — E785 Hyperlipidemia, unspecified: Secondary | ICD-10-CM | POA: Diagnosis present

## 2016-05-24 DIAGNOSIS — D62 Acute posthemorrhagic anemia: Secondary | ICD-10-CM | POA: Diagnosis present

## 2016-05-24 DIAGNOSIS — G931 Anoxic brain damage, not elsewhere classified: Secondary | ICD-10-CM | POA: Diagnosis not present

## 2016-05-24 DIAGNOSIS — D684 Acquired coagulation factor deficiency: Principal | ICD-10-CM | POA: Diagnosis present

## 2016-05-24 DIAGNOSIS — N179 Acute kidney failure, unspecified: Secondary | ICD-10-CM | POA: Diagnosis not present

## 2016-05-24 DIAGNOSIS — I8501 Esophageal varices with bleeding: Secondary | ICD-10-CM | POA: Diagnosis present

## 2016-05-24 DIAGNOSIS — I5032 Chronic diastolic (congestive) heart failure: Secondary | ICD-10-CM | POA: Diagnosis present

## 2016-05-24 DIAGNOSIS — I959 Hypotension, unspecified: Secondary | ICD-10-CM

## 2016-05-24 DIAGNOSIS — Z961 Presence of intraocular lens: Secondary | ICD-10-CM | POA: Diagnosis present

## 2016-05-24 DIAGNOSIS — D696 Thrombocytopenia, unspecified: Secondary | ICD-10-CM | POA: Diagnosis present

## 2016-05-24 LAB — I-STAT CHEM 8, ED
BUN: 11 mg/dL (ref 6–20)
CALCIUM ION: 1.13 mmol/L (ref 1.13–1.30)
CHLORIDE: 101 mmol/L (ref 101–111)
Creatinine, Ser: 1.3 mg/dL — ABNORMAL HIGH (ref 0.61–1.24)
GLUCOSE: 245 mg/dL — AB (ref 65–99)
HCT: 24 % — ABNORMAL LOW (ref 39.0–52.0)
HEMOGLOBIN: 8.2 g/dL — AB (ref 13.0–17.0)
POTASSIUM: 4.7 mmol/L (ref 3.5–5.1)
SODIUM: 136 mmol/L (ref 135–145)
TCO2: 21 mmol/L (ref 0–100)

## 2016-05-24 LAB — CBC WITH DIFFERENTIAL/PLATELET
Basophils Absolute: 0 10*3/uL (ref 0.0–0.1)
Basophils Relative: 0 %
EOS ABS: 0.2 10*3/uL (ref 0.0–0.7)
Eosinophils Relative: 2 %
HCT: 22.1 % — ABNORMAL LOW (ref 39.0–52.0)
HEMOGLOBIN: 7.5 g/dL — AB (ref 13.0–17.0)
LYMPHS ABS: 1.6 10*3/uL (ref 0.7–4.0)
Lymphocytes Relative: 17 %
MCH: 33.8 pg (ref 26.0–34.0)
MCHC: 33.9 g/dL (ref 30.0–36.0)
MCV: 99.5 fL (ref 78.0–100.0)
Monocytes Absolute: 1.1 10*3/uL — ABNORMAL HIGH (ref 0.1–1.0)
Monocytes Relative: 12 %
NEUTROS ABS: 6.4 10*3/uL (ref 1.7–7.7)
NEUTROS PCT: 69 %
Platelets: 90 10*3/uL — ABNORMAL LOW (ref 150–400)
RBC: 2.22 MIL/uL — AB (ref 4.22–5.81)
RDW: 14.5 % (ref 11.5–15.5)
WBC: 9.2 10*3/uL (ref 4.0–10.5)

## 2016-05-24 LAB — COMPREHENSIVE METABOLIC PANEL
ALBUMIN: 1.9 g/dL — AB (ref 3.5–5.0)
ALK PHOS: 100 U/L (ref 38–126)
ALT: 21 U/L (ref 17–63)
AST: 29 U/L (ref 15–41)
Anion gap: 6 (ref 5–15)
BUN: 13 mg/dL (ref 6–20)
CALCIUM: 7.6 mg/dL — AB (ref 8.9–10.3)
CO2: 21 mmol/L — AB (ref 22–32)
CREATININE: 1.28 mg/dL — AB (ref 0.61–1.24)
Chloride: 105 mmol/L (ref 101–111)
GFR calc Af Amer: >60 mL/min (ref >60)
GFR calc non Af Amer: 60 mL/min — ABNORMAL LOW (ref >60)
GLUCOSE: 252 mg/dL — AB (ref 65–99)
Potassium: 5.2 mmol/L — ABNORMAL HIGH (ref 3.5–5.1)
SODIUM: 132 mmol/L — AB (ref 135–145)
Total Bilirubin: 3.7 mg/dL — ABNORMAL HIGH (ref 0.3–1.2)
Total Protein: 5.3 g/dL — ABNORMAL LOW (ref 6.5–8.1)

## 2016-05-24 LAB — PROTIME-INR
INR: 1.96 — ABNORMAL HIGH (ref 0.00–1.49)
PROTHROMBIN TIME: 22.3 s — AB (ref 11.6–15.2)

## 2016-05-24 LAB — CBG MONITORING, ED: GLUCOSE-CAPILLARY: 258 mg/dL — AB (ref 65–99)

## 2016-05-24 LAB — I-STAT CG4 LACTIC ACID, ED: LACTIC ACID, VENOUS: 3.62 mmol/L — AB (ref 0.5–1.9)

## 2016-05-24 LAB — AMMONIA: Ammonia: 72 umol/L — ABNORMAL HIGH (ref 9–35)

## 2016-05-24 MED ORDER — SODIUM CHLORIDE 0.9 % IV BOLUS (SEPSIS)
1000.0000 mL | Freq: Once | INTRAVENOUS | Status: AC
  Administered 2016-05-24: 1000 mL via INTRAVENOUS

## 2016-05-24 MED ORDER — SODIUM CHLORIDE 0.9 % IV BOLUS (SEPSIS)
1000.0000 mL | Freq: Once | INTRAVENOUS | Status: AC
Start: 2016-05-24 — End: 2016-05-24
  Administered 2016-05-24: 1000 mL via INTRAVENOUS

## 2016-05-24 NOTE — ED Notes (Signed)
Uncontrolled nosebleed

## 2016-05-24 NOTE — ED Provider Notes (Signed)
CSN: [id number]     Arrival date & time 06/13/2016  1959 History  By signing my name below, I, [facility], attest that this documentation has been prepared under the direction and in the presence of Milton Ferguson, MD. Electronically Signed: Virgel Bouquet, ED Scribe. 06/14/2016. 9:57 PM.   Chief Complaint  Patient presents with  . Epistaxis   Patient is a 60 y.o. male presenting with nosebleeds. The history is provided by the patient and medical records. No language interpreter was used.  Epistaxis Location:  Bilateral Severity:  Moderate Duration:  40 minutes Timing:  Constant Progression:  Improving Chronicity:  New Context: hypertension and thrombocytopenia   Relieved by:  None tried Worsened by:  Nothing tried Ineffective treatments:  None tried Associated symptoms: blood in oropharynx   Associated symptoms: no congestion, no cough, no headaches and no syncope   Risk factors: no head and neck surgery, no head and neck tumor, no recent chemotherapy and no recent nasal surgery    HPI Comments: Kyle [name] is a 60 y.o. male with a hx of DM, cirrhosis, HTN, HLN, bladder cancer, CHF, stage III CKD, IDA, thrombocytopenia, who presents to the Emergency Department complaining of one episode of severe, gradually improving epistaxis that first occurred 40 minutes ago. Pt states that the blood exited from his nose and also traveled from his nose and into his mouth. His GI specialist is Dr. Sydell Axon. Per medical records, pt was seen 9 days ago in the ED for the same complaint by Dr. Abdulrahim Siddiqi [place] where he was admitted to the ICU for further care. Denies hematemesis.  Past Medical History  Diagnosis Date  . DM (diabetes mellitus) ([place])   . Cirrhosis ([place])     bx proven steatohepatitis with cirrhosis (2010); per note from Feb 2011, received Hep A and B vaccines in 2010.  [name] HTN (hypertension)   . RLS (restless legs syndrome)   . Hyperlipidemia   . IDA (iron deficiency anemia)    . GERD (gastroesophageal reflux disease)   . Depression   . Peripheral neuropathy ([place])   . Urothelial cancer ([place])     2010, paillary low-grade, h/o recurrence 2011, recurrence 02/2015  . B12 deficiency   . Psoriasis   . 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 Anemia due to multiple mechanisms 05/13/2011  . History of alcohol abuse 05/13/2011  . ANEMIA-IRON DEFICIENCY 03/09/2009  . S/P endoscopy May 2012    4 columns grade II esophageal varices; due for repeat in Nov 2013   . S/P colonoscopy May 2012    Tubular adenoma  . Low back pain     possible bulging disc, bone scan   . Cancer ([place])     bladder ca 05/2009 removal and  w/chemo wash  . Esophageal varices with bleeding(456.0) 11/24/11    s/p emergent EGD 11/25/11 by Dr. Hilarie Fredrickson at [facility], Grade III esophageal varices s/p banding X 5  . Small bowel obstruction ([place]) 02/15/2012    Admitted to [facility], managed by Dr. Geroge Baseman, ventral hernia manually reduced  . Ventral hernia 02/15/12  . MRSA (methicillin resistant Staphylococcus aureus)   . Acute post-ligation esophageal ulcer with hemorrhage 04/11/2012  . Clostridium difficile infection   . H. pylori infection 12/24/2012  . Sleep apnea     [facility] took him off CPAP when had tips procedure  . Sleep apnea   . Cellulitis 04/2014    right upper leg  . Iron deficiency anemia secondary to blood loss (chronic) 03/09/2009  Secondary to GI blood loss- Rectal and esophageal varices, portal gastropathy, and esophageal ulcers    . Bladder cancer ([place])   . Chronic diastolic CHF (congestive heart failure) ([place]) 03/21/2016    01/2016 echo: EF 60-65%; LVH; grade 2 diastolic dysfunction.  . CKD (chronic kidney disease), stage III 03/21/2016  . DKA, type 2 ([place]) 03/19/2016   Past Surgical History  Procedure Laterality Date  . Carpel tunnel Right   . Shoulder surgery Left   . Adrenal mass surgery  05/2009    benign, left  . Bladder surgery  01/2009 and 06/2010    cancer 2010, small recurrence in  06/2010  . Colonoscopy  04/2009    moderate int hemorrhoids, rare sigmoid diverticula, one mm sessile hyperplastic rectal polyp  . Esophagogastroduodenoscopy  03/2009    small hh  . Small bowel capsule endoscopy  03/2009    couple of small benign appearing erosions, nonbleeding  . Egd/tcs  08/2007    small hiatal hernia, pancolonic diverticula, friable anal canal, 3cm salmon colored epithelium in distal esophagus, bx negative for Barrett's  . Skin cancer excision  Oct 2012    left arm  . Esophagogastroduodenoscopy  11/25/2011    Dr. Phineas Douglas III varices in the mid and distal esophagus, banding placed, portal astropathy  . Esophagogastroduodenoscopy  01/14/2012    Dr Rourk->4-5 columns Gr2 varices, 6 bands placed, HH, distal esophageal ulcer, portal gastropathy, antral erosions  . Esophagogastroduodenoscopy  03/17/2012    Dr. Larina Bras varices status post band ligation. Hiatal hernia. Portal gastropathy.  . Umbilical hernia repair  04/05/2012    Procedure: HERNIA REPAIR UMBILICAL ADULT;  Surgeon: Donato Heinz, MD;  Location: [facility];  Service: General;  Laterality: N/A;  . Esophagogastroduodenoscopy  04/11/2012    Dr. Bridgett Larsson ulcer possibly at previous banding sites with stigmata     of bleeding.  Attempt at hemostasis with hemoclip and banding was not successful resulting in recurrence of bleed/  Portal gastropathy, but no evidence of gastric varices/ Recurrent esophageal varices grade 2.  . Esophagogastroduodenoscopy  05/24/2012    Dr. Larina Bras varices, portal gastropathy  . Esophagogastroduodenoscopy  04/11/12    Dr. Drue Novel in the distal esophagus adherent lot and visible vessel treated with bicap. grade I varices in the distal esophagus, one ligating band placed, portal hypertensive gastropathy in the body of the stomach.  . Colonoscopy  09/29/2012    [id number] varices. Scattered pancolonic diverticula/single cecal polyp-removed as described above  .  Esophagogastroduodenoscopy  12/24/2012    Dr. Lynwood Dawley significant upper GI bleed secondary to bleeding esophageal varices. s/p hemostasis therapy with injection of a total of 51mL of 5% ethanolamine and application of 9 bands, complete egd not carried out.  . Schlerotherapy  12/24/2012    Procedure: Woodward Ku OF VARICES;  Surgeon: Daneil Dolin, MD;  Location[facility];  Service: Endoscopy;  Laterality: N/A;  . Esophageal banding  12/24/2012    [id number] significant upper GI bleed secondary to bleeding esophageal varices  . Tips procedure  12/2012    [facility]  . Cataract extraction, bilateral    . Hernia repair      umbilical  . Eye surgery      bilateral cataract surgery with lens implants  . Transurethral resection of bladder tumor N/A 03/12/2015    Procedure: TRANSURETHRAL RESECTION OF BLADDER TUMOR (TURBT);  Surgeon: Raynelle Bring, MD;  Location: [facility];  Service: Urology;  Laterality: N/A;  . Cystoscopy w/ ureteral stent placement Bilateral 03/12/2015  Procedure: CYSTOSCOPY WITH BILATERAL RETROGRADE PYELOGRAM/RIGHT URETEROSCOPY;  Surgeon: Raynelle Bring, MD;  Location: [facility];  Service: Urology;  Laterality: Bilateral;  . Circumcision N/A 03/12/2015    Procedure: CIRCUMCISION ADULT;  Surgeon: Raynelle Bring, MD;  Location: [facility];  Service: Urology;  Laterality: N/A;   Family History  Problem Relation Age of Onset  . Cirrhosis Father     etoh  . Colon cancer Neg Hx   . Anesthesia problems Neg Hx   . Hypotension Neg Hx   . Malignant hyperthermia Neg Hx   . Pseudochol deficiency Neg Hx   . Kidney cancer Mother   . Cancer Mother   . HIV Brother   . Cirrhosis Brother     nash   Social History  Substance Use Topics  . Smoking status: Current Every Day Smoker -- 1.00 packs/day for 30 years    Types: Cigarettes  . Smokeless tobacco: Never Used  . Alcohol Use: No     Comment: drank heavily for few years in 20s    Review of Systems  Constitutional:  Negative for appetite change and fatigue.  HENT: Positive for nosebleeds. Negative for congestion, ear discharge and sinus pressure.   Eyes: Negative for discharge.  Respiratory: Negative for cough.   Cardiovascular: Negative for chest pain and syncope.  Gastrointestinal: Negative for vomiting, abdominal pain and diarrhea.  Genitourinary: Negative for frequency and hematuria.  Musculoskeletal: Negative for back pain.  Skin: Negative for rash.  Neurological: Negative for seizures and headaches.  Psychiatric/Behavioral: Negative for hallucinations.      Allergies  Aspirin; Ibuprofen; and Tylenol  Home Medications   Prior to Admission medications   Medication Sig Start Date End Date Taking? Authorizing Provider  ACCU-CHEK AVIVA PLUS test strip USE TEST STRIPS TO CHECK BLOOD SUGAR THREE TIMES DAILY AS DIRECTED 12/17/15  Yes Orlena Sheldon, PA-C  furosemide (LASIX) 40 MG tablet Take 2 tablets (80 mg total) by mouth daily. 05/21/16  Yes Susy Frizzle, MD  Insulin Glargine (TOUJEO SOLOSTAR) 300 UNIT/ML SOPN Inject 25 Units into the skin 2 (two) times daily. 05/17/16  Yes Erline Hau, MD  Insulin Pen Needle 29G X 12.7MM MISC Use with Toujeo solostar 09/10/15  Yes Susy Frizzle, MD  insulin regular human CONCENTRATED (HUMULIN R) 500 UNIT/ML injection Inject 0.6 mLs (300 Units total) into the skin 3 (three) times daily. 04/25/16  Yes Susy Frizzle, MD  Insulin Syringes, Disposable, (B-D INSULIN SYRINGE 1CC) U-100 1 ML MISC Inject 60 Units into the skin 3 (three) times daily. 04/16/16  Yes Susy Frizzle, MD  lactulose (CHRONULAC) 10 GM/15ML solution Take 45 mLs (30 g total) by mouth 3 (three) times daily. 03/21/16  Yes Rexene Alberts, MD  metoCLOPramide (REGLAN) 10 MG tablet Take 1 tablet (10 mg total) by mouth 3 (three) times daily before meals. 04/25/16  Yes Susy Frizzle, MD  omeprazole (PRILOSEC) 40 MG capsule TAKE ONE CAPSULE BY MOUTH ONCE DAILY 12/24/15  Yes Carlis Stable, NP   oxyCODONE (ROXICODONE) 5 MG immediate release tablet Take 1 tablet (5 mg total) by mouth every 4 (four) hours as needed for severe pain. 07/02/15  Yes Tanna Furry, MD  propranolol (INDERAL) 20 MG tablet TAKE ONE TABLET BY MOUTH TWICE DAILY 03/03/16  Yes Orvil Feil, NP  spironolactone (ALDACTONE) 50 MG tablet Take 50 mg by mouth 2 (two) times daily.  03/03/14  Yes Historical Provider, MD  XIFAXAN 550 MG TABS tablet TAKE ONE TABLET BY  MOUTH TWICE DAILY 03/03/16  Yes Orvil Feil, NP   BP 120/64 mmHg  Pulse 65  Temp(Src) 98.3 F (36.8 C) (Oral)  Resp 16  Ht 5\' 6"  (1.676 m)  Wt 235 lb (106.595 kg)  BMI 37.95 kg/m2  SpO2 92% Physical Exam  Constitutional: He is oriented to person, place, and time. He appears well-developed.  HENT:  Head: Normocephalic.  Copious amounts of dried blood in the pt's mouth.  Eyes: Conjunctivae and EOM are normal. No scleral icterus.  Neck: Neck supple. No thyromegaly present.  Cardiovascular: Normal rate and regular rhythm.  Exam reveals no gallop and no friction rub.   No murmur heard. Pulmonary/Chest: No stridor. He has no wheezes. He has no rales. He exhibits no tenderness.  Abdominal: He exhibits no distension. There is no tenderness. There is no rebound.  Musculoskeletal: Normal range of motion. He exhibits no edema.  Lymphadenopathy:    He has no cervical adenopathy.  Neurological: He is oriented to person, place, and time. He exhibits normal muscle tone. Coordination normal.  Skin: No rash noted. No erythema.  Psychiatric: He has a normal mood and affect. His behavior is normal.    ED Course  Procedures (including critical care time)  DIAGNOSTIC STUDIES: Oxygen Saturation is 93% on RA, low by my interpretation.    COORDINATION OF CARE: 8:36 PM Will order IV fluids. Discussed treatment plan with pt at bedside and pt agreed to plan.   Labs Review Labs Reviewed  CBC WITH DIFFERENTIAL/PLATELET - Abnormal; Notable for the following:    RBC 2.22 (*)     Hemoglobin 7.5 (*)    HCT 22.1 (*)    Platelets 90 (*)    Monocytes Absolute 1.1 (*)    All other components within normal limits  COMPREHENSIVE METABOLIC PANEL - Abnormal; Notable for the following:    Sodium 132 (*)    Potassium 5.2 (*)    CO2 21 (*)    Glucose, Bld 252 (*)    Creatinine, Ser 1.28 (*)    Calcium 7.6 (*)    Total Protein 5.3 (*)    Albumin 1.9 (*)    Total Bilirubin 3.7 (*)    GFR calc non Af Amer 60 (*)    All other components within normal limits  PROTIME-INR - Abnormal; Notable for the following:    Prothrombin Time 22.3 (*)    INR 1.96 (*)    All other components within normal limits  AMMONIA - Abnormal; Notable for the following:    Ammonia 72 (*)    All other components within normal limits  I-STAT CG4 LACTIC ACID, ED - Abnormal; Notable for the following:    Lactic Acid, Venous 3.62 (*)    All other components within normal limits  I-STAT CHEM 8, ED - Abnormal; Notable for the following:    Creatinine, Ser 1.30 (*)    Glucose, Bld 245 (*)    Hemoglobin 8.2 (*)    HCT 24.0 (*)    All other components within normal limits  CBG MONITORING, ED - Abnormal; Notable for the following:    Glucose-Capillary 258 (*)    All other components within normal limits  TYPE AND SCREEN    Imaging Review No results found. I have personally reviewed and evaluated these images and lab results as part of my medical decision-making.   EKG Interpretation None      MDM   Final diagnoses:  None   Patient with epistaxis and anemia patient  will be admitted to the hospital for monitoring  The chart was scribed for me under my direct supervision.  I personally performed the history, physical, and medical decision making and all procedures in the evaluation of this patient.Milton Ferguson, MD 06/01/16 [phone]

## 2016-05-25 ENCOUNTER — Encounter (HOSPITAL_COMMUNITY): Payer: Self-pay

## 2016-05-25 DIAGNOSIS — D62 Acute posthemorrhagic anemia: Secondary | ICD-10-CM | POA: Diagnosis present

## 2016-05-25 DIAGNOSIS — D684 Acquired coagulation factor deficiency: Secondary | ICD-10-CM | POA: Diagnosis present

## 2016-05-25 DIAGNOSIS — R040 Epistaxis: Secondary | ICD-10-CM | POA: Diagnosis present

## 2016-05-25 DIAGNOSIS — E1129 Type 2 diabetes mellitus with other diabetic kidney complication: Secondary | ICD-10-CM | POA: Diagnosis present

## 2016-05-25 DIAGNOSIS — I248 Other forms of acute ischemic heart disease: Secondary | ICD-10-CM | POA: Diagnosis present

## 2016-05-25 DIAGNOSIS — I5032 Chronic diastolic (congestive) heart failure: Secondary | ICD-10-CM | POA: Diagnosis present

## 2016-05-25 DIAGNOSIS — I130 Hypertensive heart and chronic kidney disease with heart failure and stage 1 through stage 4 chronic kidney disease, or unspecified chronic kidney disease: Secondary | ICD-10-CM | POA: Diagnosis present

## 2016-05-25 DIAGNOSIS — N183 Chronic kidney disease, stage 3 (moderate): Secondary | ICD-10-CM | POA: Diagnosis present

## 2016-05-25 DIAGNOSIS — D6959 Other secondary thrombocytopenia: Secondary | ICD-10-CM | POA: Diagnosis not present

## 2016-05-25 DIAGNOSIS — E1165 Type 2 diabetes mellitus with hyperglycemia: Secondary | ICD-10-CM | POA: Diagnosis present

## 2016-05-25 DIAGNOSIS — D696 Thrombocytopenia, unspecified: Secondary | ICD-10-CM | POA: Diagnosis present

## 2016-05-25 DIAGNOSIS — J9601 Acute respiratory failure with hypoxia: Secondary | ICD-10-CM | POA: Diagnosis not present

## 2016-05-25 DIAGNOSIS — I8501 Esophageal varices with bleeding: Secondary | ICD-10-CM | POA: Diagnosis present

## 2016-05-25 DIAGNOSIS — N179 Acute kidney failure, unspecified: Secondary | ICD-10-CM | POA: Diagnosis not present

## 2016-05-25 DIAGNOSIS — I959 Hypotension, unspecified: Secondary | ICD-10-CM | POA: Diagnosis present

## 2016-05-25 DIAGNOSIS — E1122 Type 2 diabetes mellitus with diabetic chronic kidney disease: Secondary | ICD-10-CM | POA: Diagnosis present

## 2016-05-25 DIAGNOSIS — F17210 Nicotine dependence, cigarettes, uncomplicated: Secondary | ICD-10-CM | POA: Diagnosis present

## 2016-05-25 DIAGNOSIS — K7460 Unspecified cirrhosis of liver: Secondary | ICD-10-CM | POA: Diagnosis present

## 2016-05-25 DIAGNOSIS — Z6841 Body Mass Index (BMI) 40.0 and over, adult: Secondary | ICD-10-CM | POA: Diagnosis not present

## 2016-05-25 DIAGNOSIS — E872 Acidosis: Secondary | ICD-10-CM | POA: Diagnosis not present

## 2016-05-25 DIAGNOSIS — I469 Cardiac arrest, cause unspecified: Secondary | ICD-10-CM | POA: Diagnosis not present

## 2016-05-25 DIAGNOSIS — K7581 Nonalcoholic steatohepatitis (NASH): Secondary | ICD-10-CM | POA: Diagnosis not present

## 2016-05-25 DIAGNOSIS — K2211 Ulcer of esophagus with bleeding: Secondary | ICD-10-CM | POA: Diagnosis present

## 2016-05-25 DIAGNOSIS — G931 Anoxic brain damage, not elsewhere classified: Secondary | ICD-10-CM | POA: Diagnosis not present

## 2016-05-25 DIAGNOSIS — D731 Hypersplenism: Secondary | ICD-10-CM | POA: Diagnosis present

## 2016-05-25 DIAGNOSIS — Z8551 Personal history of malignant neoplasm of bladder: Secondary | ICD-10-CM | POA: Diagnosis not present

## 2016-05-25 DIAGNOSIS — E1121 Type 2 diabetes mellitus with diabetic nephropathy: Secondary | ICD-10-CM | POA: Diagnosis not present

## 2016-05-25 DIAGNOSIS — I9589 Other hypotension: Secondary | ICD-10-CM | POA: Diagnosis not present

## 2016-05-25 DIAGNOSIS — IMO0002 Reserved for concepts with insufficient information to code with codable children: Secondary | ICD-10-CM | POA: Diagnosis present

## 2016-05-25 DIAGNOSIS — R578 Other shock: Secondary | ICD-10-CM | POA: Diagnosis not present

## 2016-05-25 DIAGNOSIS — R579 Shock, unspecified: Secondary | ICD-10-CM | POA: Diagnosis not present

## 2016-05-25 DIAGNOSIS — I10 Essential (primary) hypertension: Secondary | ICD-10-CM | POA: Diagnosis not present

## 2016-05-25 LAB — MRSA PCR SCREENING: MRSA by PCR: NEGATIVE

## 2016-05-25 LAB — BASIC METABOLIC PANEL
Anion gap: 9 (ref 5–15)
Anion gap: 11 (ref 5–15)
BUN: 20 mg/dL (ref 6–20)
BUN: 23 mg/dL — ABNORMAL HIGH (ref 6–20)
CHLORIDE: 106 mmol/L (ref 101–111)
CO2: 15 mmol/L — ABNORMAL LOW (ref 22–32)
CO2: 17 mmol/L — AB (ref 22–32)
CREATININE: 1.41 mg/dL — AB (ref 0.61–1.24)
Calcium: 7.7 mg/dL — ABNORMAL LOW (ref 8.9–10.3)
Calcium: 8.2 mg/dL — ABNORMAL LOW (ref 8.9–10.3)
Chloride: 109 mmol/L (ref 101–111)
Creatinine, Ser: 1.5 mg/dL — ABNORMAL HIGH (ref 0.61–1.24)
GFR calc non Af Amer: 53 mL/min — ABNORMAL LOW (ref >60)
GFR calc non Af Amer: 49 mL/min — ABNORMAL LOW (ref >60)
GFR, EST AFRICAN AMERICAN: 57 mL/min — AB (ref >60)
Glucose, Bld: 300 mg/dL — ABNORMAL HIGH (ref 65–99)
Glucose, Bld: 276 mg/dL — ABNORMAL HIGH (ref 65–99)
Potassium: 6.2 mmol/L — ABNORMAL HIGH (ref 3.5–5.1)
Potassium: 5.1 mmol/L (ref 3.5–5.1)
SODIUM: 133 mmol/L — AB (ref 135–145)
Sodium: 134 mmol/L — ABNORMAL LOW (ref 135–145)

## 2016-05-25 LAB — CBC
HCT: 21.4 % — ABNORMAL LOW (ref 39.0–52.0)
Hemoglobin: 7.3 g/dL — ABNORMAL LOW (ref 13.0–17.0)
MCH: 32.2 pg (ref 26.0–34.0)
MCHC: 34.1 g/dL (ref 30.0–36.0)
MCV: 94.3 fL (ref 78.0–100.0)
PLATELETS: 77 10*3/uL — AB (ref 150–400)
RBC: 2.27 MIL/uL — ABNORMAL LOW (ref 4.22–5.81)
RDW: 16.4 % — AB (ref 11.5–15.5)
WBC: 10.1 10*3/uL (ref 4.0–10.5)

## 2016-05-25 LAB — GLUCOSE, CAPILLARY
GLUCOSE-CAPILLARY: 322 mg/dL — AB (ref 65–99)
Glucose-Capillary: 317 mg/dL — ABNORMAL HIGH (ref 65–99)
Glucose-Capillary: 273 mg/dL — ABNORMAL HIGH (ref 65–99)

## 2016-05-25 LAB — PREPARE RBC (CROSSMATCH)

## 2016-05-25 LAB — HEMOGLOBIN AND HEMATOCRIT, BLOOD
HCT: 16.9 % — ABNORMAL LOW (ref 39.0–52.0)
HEMOGLOBIN: 5.8 g/dL — AB (ref 13.0–17.0)

## 2016-05-25 LAB — I-STAT CG4 LACTIC ACID, ED: Lactic Acid, Venous: 3.6 mmol/L (ref 0.5–1.9)

## 2016-05-25 MED ORDER — METOCLOPRAMIDE HCL 10 MG PO TABS
10.0000 mg | ORAL_TABLET | Freq: Three times a day (TID) | ORAL | Status: DC
  Administered 2016-05-25 – 2016-05-27 (×7): 10 mg via ORAL
  Filled 2016-05-25 (×7): qty 1

## 2016-05-25 MED ORDER — INSULIN GLARGINE 100 UNIT/ML ~~LOC~~ SOLN
25.0000 [IU] | Freq: Two times a day (BID) | SUBCUTANEOUS | Status: DC
  Administered 2016-05-25 – 2016-05-27 (×5): 25 [IU] via SUBCUTANEOUS
  Filled 2016-05-25 (×11): qty 0.25

## 2016-05-25 MED ORDER — OXYCODONE HCL 5 MG PO TABS
5.0000 mg | ORAL_TABLET | ORAL | Status: DC | PRN
  Administered 2016-05-25 – 2016-05-27 (×9): 5 mg via ORAL
  Filled 2016-05-25 (×9): qty 1

## 2016-05-25 MED ORDER — SODIUM CHLORIDE 0.9 % IV SOLN
Freq: Once | INTRAVENOUS | Status: AC
  Administered 2016-05-25: 20:00:00 via INTRAVENOUS

## 2016-05-25 MED ORDER — ONDANSETRON HCL 4 MG/2ML IJ SOLN
4.0000 mg | Freq: Four times a day (QID) | INTRAMUSCULAR | Status: DC | PRN
  Administered 2016-05-25: 4 mg via INTRAVENOUS
  Filled 2016-05-25: qty 2

## 2016-05-25 MED ORDER — VITAMIN K1 10 MG/ML IJ SOLN
10.0000 mg | Freq: Once | INTRAVENOUS | Status: AC
  Administered 2016-05-25: 10 mg via INTRAVENOUS
  Filled 2016-05-25: qty 1

## 2016-05-25 MED ORDER — SODIUM CHLORIDE 0.9 % IV SOLN: Freq: Once | INTRAVENOUS | Status: DC

## 2016-05-25 MED ORDER — INSULIN ASPART 100 UNIT/ML ~~LOC~~ SOLN
0.0000 [IU] | Freq: Every day | SUBCUTANEOUS | Status: DC
  Administered 2016-05-25: 3 [IU] via SUBCUTANEOUS

## 2016-05-25 MED ORDER — SODIUM CHLORIDE 0.9 % IV BOLUS (SEPSIS)
250.0000 mL | Freq: Once | INTRAVENOUS | Status: AC
  Administered 2016-05-25: 250 mL via INTRAVENOUS

## 2016-05-25 MED ORDER — RIFAXIMIN 550 MG PO TABS
550.0000 mg | ORAL_TABLET | Freq: Two times a day (BID) | ORAL | Status: DC
  Administered 2016-05-25 – 2016-05-27 (×5): 550 mg via ORAL
  Filled 2016-05-25 (×5): qty 1

## 2016-05-25 MED ORDER — PANTOPRAZOLE SODIUM 40 MG PO TBEC
40.0000 mg | DELAYED_RELEASE_TABLET | Freq: Every day | ORAL | Status: DC
Start: 2016-05-25 — End: 2016-05-28
  Administered 2016-05-26 – 2016-05-27 (×2): 40 mg via ORAL
  Filled 2016-05-25 (×2): qty 1

## 2016-05-25 MED ORDER — BACITRACIN ZINC 500 UNIT/GM EX OINT
TOPICAL_OINTMENT | CUTANEOUS | Status: AC
  Filled 2016-05-25: qty 0.9

## 2016-05-25 MED ORDER — SODIUM CHLORIDE 0.9 % IV SOLN
Freq: Once | INTRAVENOUS | Status: AC
  Administered 2016-05-25: 04:00:00 via INTRAVENOUS

## 2016-05-25 MED ORDER — SODIUM CHLORIDE 0.9 % IV SOLN
INTRAVENOUS | Status: DC
  Administered 2016-05-25 – 2016-05-27 (×2): via INTRAVENOUS

## 2016-05-25 MED ORDER — VITAMIN K1 10 MG/ML IJ SOLN
5.0000 mg | Freq: Once | INTRAMUSCULAR | Status: AC
  Administered 2016-05-25: 5 mg via INTRAVENOUS
  Filled 2016-05-25: qty 0.5

## 2016-05-25 MED ORDER — VITAMIN K1 10 MG/ML IJ SOLN
10.0000 mg | Freq: Once | INTRAVENOUS | Status: DC
  Filled 2016-05-25: qty 1

## 2016-05-25 MED ORDER — ONDANSETRON HCL 4 MG PO TABS: 4.0000 mg | ORAL_TABLET | Freq: Four times a day (QID) | ORAL | Status: DC | PRN

## 2016-05-25 MED ORDER — VITAMIN K1 10 MG/ML IJ SOLN
INTRAMUSCULAR | Status: AC
  Filled 2016-05-25: qty 1

## 2016-05-25 MED ORDER — LACTULOSE 10 GM/15ML PO SOLN
30.0000 g | Freq: Three times a day (TID) | ORAL | Status: DC
  Administered 2016-05-25 – 2016-05-26 (×3): 30 g via ORAL
  Filled 2016-05-25 (×5): qty 45

## 2016-05-25 MED ORDER — INSULIN ASPART 100 UNIT/ML ~~LOC~~ SOLN
0.0000 [IU] | Freq: Three times a day (TID) | SUBCUTANEOUS | Status: DC
  Administered 2016-05-25: 7 [IU] via SUBCUTANEOUS
  Administered 2016-05-26: 3 [IU] via SUBCUTANEOUS

## 2016-05-25 MED ORDER — INSULIN REGULAR HUMAN (CONC) 500 UNIT/ML ~~LOC~~ SOLN: 300.0000 [IU] | Freq: Three times a day (TID) | SUBCUTANEOUS | Status: DC

## 2016-05-25 NOTE — ED Provider Notes (Signed)
  Dr. Darrick Meigs requests consult for epistaxis.  Coagulopathic patient with intrinsic liver failure Bleeding briskly from R nare  Has stopped on my arrival but has copious clot Blew out clot, packed with rhino rocket.  Physical Exam  BP 85/42 mmHg  Pulse 71  Temp(Src) 97.1 F (36.2 C) (Oral)  Resp 20  Ht 5\' 5"  (1.651 m)  Wt 259 lb 11.2 oz (117.8 kg)  BMI 43.22 kg/m2  SpO2 98%  Physical Exam  ED Course  .Epistaxis Management Date/Time: 05/25/2016 7:31 AM Performed by: Noemi Chapel Authorized by: Noemi Chapel Consent: Verbal consent obtained. Risks and benefits: risks, benefits and alternatives were discussed Consent given by: patient Patient understanding: patient states understanding of the procedure being performed Required items: required blood products, implants, devices, and special equipment available Patient identity confirmed: verbally with patient Time out: Immediately prior to procedure a "time out" was called to verify the correct patient, procedure, equipment, support staff and site/side marked as required. Anesthesia: local infiltration Patient sedated: no Treatment site: right posterior Post-procedure assessment: bleeding stopped Treatment complexity: simple Patient tolerance: Patient tolerated the procedure well with no immediate complications Comments: The pt had good cessation of bleeding with packing - Dr. Darrick Meigs to follow and consult specialty service during day hours as needed.          Noemi Chapel, MD 05/25/16 [phone]

## 2016-05-25 NOTE — Progress Notes (Signed)
2 units of FFP ordered at [facility], one unit released. There is a note stating these 2 units were to be given in transport with [facility], no note/documentation stating they were given. Pincus Sanes, PA notified and stated we will check labs again in the morning as there is no active bleeding. Pt had one dark loose stool and transfusing 1 unit PRBCs now. Will continue to monitor.

## 2016-05-25 NOTE — ED Notes (Signed)
Dr. Wilburn Cornelia at bedside with patient. ICU RN at bedside with patient. Pt has ready bed on 14M.

## 2016-05-25 NOTE — Progress Notes (Signed)
Continued nose bleed after ER treatment. Given one unit of blood before shift change and Vitamin K. Given 1 more unit of blood. Ordered to be transferred to [facility]. Dr. Jerilee Hoh arranged ENT to see patient in ER at [facility] and then will be transferred to SDU bed there. [facility] is transporting 2 units of FFP and 1 more 50 ml bag of Vitamin K. [facility] also has all belongings. Wife was called and messages left on house phone and mobile phone. Patient was awake and oriented for transfer and agreed to treatment plan.

## 2016-05-25 NOTE — Consult Note (Signed)
ENT CONSULT:  Reason for Consult: Severe epistaxis Referring Physician: [facility]  Kyle [name] is an 60 y.o. male.   HPI: The patient presents [facility] on transfer from [facility] for evaluation and management of severe epistaxis. The patient has a copy. Past medical history which includes end-stage liver disease with esophageal varices, chronic anemia and coagulopathy. The patient reports no recent nasal trauma or infection. He developed acute epistaxis one week ago and was admitted to [facility], no cautery or packing required. Patient developed recurrent acute epistaxis on 7/1 and was reevaluated and admitted to the hospital, right nasal packing placed but the patient continued to have significant anterior posterior bleeding. Patient transferred to [facility] for further management.  Past Medical History  Diagnosis Date  . DM (diabetes mellitus) ([place])   . Cirrhosis ([place])     bx proven steatohepatitis with cirrhosis (2010); per note from Feb 2011, received Hep A and B vaccines in 2010.  [name] HTN (hypertension)   . RLS (restless legs syndrome)   . Hyperlipidemia   . IDA (iron deficiency anemia)   . GERD (gastroesophageal reflux disease)   . Depression   . Peripheral neuropathy ([place])   . Urothelial cancer ([place])     2010, paillary low-grade, h/o recurrence 2011, recurrence 02/2015  . B12 deficiency   . Psoriasis   . 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 Anemia due to multiple mechanisms 05/13/2011  . History of alcohol abuse 05/13/2011  . ANEMIA-IRON DEFICIENCY 03/09/2009  . S/P endoscopy May 2012    4 columns grade II esophageal varices; due for repeat in Nov 2013   . S/P colonoscopy May 2012    Tubular adenoma  . Low back pain     possible bulging disc, bone scan   . Cancer ([place])     bladder ca 05/2009 removal and  w/chemo wash  . Esophageal varices with bleeding(456.0) 11/24/11    s/p emergent EGD 11/25/11 by Dr. Hilarie Fredrickson at  [facility], Grade III esophageal varices s/p banding X 5  . Small bowel obstruction ([place]) 02/15/2012    Admitted to [facility], managed by Dr. Geroge Baseman, ventral hernia manually reduced  . Ventral hernia 02/15/12  . MRSA (methicillin resistant Staphylococcus aureus)   . Acute post-ligation esophageal ulcer with hemorrhage 04/11/2012  . Clostridium difficile infection   . H. pylori infection 12/24/2012  . Sleep apnea     [facility] took him off CPAP when had tips procedure  . Sleep apnea   . Cellulitis 04/2014    right upper leg  . Iron deficiency anemia secondary to blood loss (chronic) 03/09/2009    Secondary to GI blood loss- Rectal and esophageal varices, portal gastropathy, and esophageal ulcers    . Bladder cancer ([place])   . Chronic diastolic CHF (congestive heart failure) ([place]) 03/21/2016    01/2016 echo: EF 60-65%; LVH; grade 2 diastolic dysfunction.  . CKD (chronic kidney disease), stage III 03/21/2016  . DKA, type 2 ([place]) 03/19/2016    Past Surgical History  Procedure Laterality Date  . Carpel tunnel Right   . Shoulder surgery Left   . Adrenal mass surgery  05/2009    benign, left  . Bladder surgery  01/2009 and 06/2010    cancer 2010, small recurrence in 06/2010  . Colonoscopy  04/2009    moderate int hemorrhoids, rare sigmoid diverticula, one mm sessile hyperplastic rectal polyp  . Esophagogastroduodenoscopy  03/2009    small hh  . Small  bowel capsule endoscopy  03/2009    couple of small benign appearing erosions, nonbleeding  . Egd/tcs  08/2007    small hiatal hernia, pancolonic diverticula, friable anal canal, 3cm salmon colored epithelium in distal esophagus, bx negative for Barrett's  . Skin cancer excision  Oct 2012    left arm  . Esophagogastroduodenoscopy  11/25/2011    Dr. Phineas Douglas III varices in the mid and distal esophagus, banding placed, portal astropathy  . Esophagogastroduodenoscopy  01/14/2012    Dr Rourk->4-5 columns Gr2 varices, 6 bands placed, HH, distal esophageal ulcer, portal  gastropathy, antral erosions  . Esophagogastroduodenoscopy  03/17/2012    Dr. Larina Bras varices status post band ligation. Hiatal hernia. Portal gastropathy.  . Umbilical hernia repair  04/05/2012    Procedure: HERNIA REPAIR UMBILICAL ADULT;  Surgeon: Donato Heinz, MD;  Location: [facility]S;  Service: General;  Laterality: N/A;  . Esophagogastroduodenoscopy  04/11/2012    Dr. Bridgett Larsson ulcer possibly at previous banding sites with stigmata     of bleeding.  Attempt at hemostasis with hemoclip and banding was not successful resulting in recurrence of bleed/  Portal gastropathy, but no evidence of gastric varices/ Recurrent esophageal varices grade 2.  . Esophagogastroduodenoscopy  05/24/2012    Dr. Larina Bras varices, portal gastropathy  . Esophagogastroduodenoscopy  04/11/12    Dr. Drue Novel in the distal esophagus adherent lot and visible vessel treated with bicap. grade I varices in the distal esophagus, one ligating band placed, portal hypertensive gastropathy in the body of the stomach.  . Colonoscopy  09/29/2012    LZJ:QBHALP varices. Scattered pancolonic diverticula/single cecal polyp-removed as described above  . Esophagogastroduodenoscopy  12/24/2012    Dr. Lynwood Dawley significant upper GI bleed secondary to bleeding esophageal varices. s/p hemostasis therapy with injection of a total of 31m of 5% ethanolamine and application of 9 bands, complete egd not carried out.  . Schlerotherapy  12/24/2012    Procedure: SWoodward KuOF VARICES;  Surgeon: RDaneil Dolin MD;  Location: [facility] SUITE;  Service: Endoscopy;  Laterality: N/A;  . Esophageal banding  12/24/2012    RFXT:[id number] upper GI bleed secondary to bleeding esophageal varices  . Tips procedure  12/2012    [facility]  . Cataract extraction, bilateral    . Hernia repair      umbilical  . Eye surgery      bilateral cataract surgery with lens implants  . Transurethral resection of bladder tumor  N/A 03/12/2015    Procedure: TRANSURETHRAL RESECTION OF BLADDER TUMOR (TURBT);  Surgeon: LRaynelle Bring MD;  Location: [facility] ORS;  Service: Urology;  Laterality: N/A;  . Cystoscopy w/ ureteral stent placement Bilateral 03/12/2015    Procedure: CYSTOSCOPY WITH BILATERAL RETROGRADE PYELOGRAM/RIGHT URETEROSCOPY;  Surgeon: LRaynelle Bring MD;  Location: [facility] ORS;  Service: Urology;  Laterality: Bilateral;  . Circumcision N/A 03/12/2015    Procedure: CIRCUMCISION ADULT;  Surgeon: LRaynelle Bring MD;  Location: [facility] ORS;  Service: Urology;  Laterality: N/A;    Family History  Problem Relation Age of Onset  . Cirrhosis Father     etoh  . Colon cancer Neg Hx   . Anesthesia problems Neg Hx   . Hypotension Neg Hx   . Malignant hyperthermia Neg Hx   . Pseudochol deficiency Neg Hx   . Kidney cancer Mother   . Cancer Mother   . HIV Brother   . Cirrhosis Brother     nash    Social History:  reports that he has been smoking Cigarettes.  He  has a 30 pack-year smoking history. He has never used smokeless tobacco. He reports that he does not drink alcohol or use illicit drugs.  Allergies:  Allergies  Allergen Reactions  . Aspirin Other (See Comments)    Harms liver.   . Ibuprofen Other (See Comments)    Can't take per Dr Gala Romney- harms livers  . Tylenol [Acetaminophen] Other (See Comments)    Restless Legs     Medications: I have reviewed the patient's current medications.  Results for orders placed or performed during the hospital encounter of 05/25/2016 (from the past 48 hour(s))  CBG monitoring, ED     Status: Abnormal   Collection Time: 06/17/2016  8:20 PM  Result Value Ref Range   Glucose-Capillary 258 (H) 65 - 99 mg/dL  CBC with Differential/Platelet     Status: Abnormal   Collection Time: 06/06/2016  9:03 PM  Result Value Ref Range   WBC 9.2 4.0 - 10.5 K/uL   RBC 2.22 (L) 4.22 - 5.81 MIL/uL   Hemoglobin 7.5 (L) 13.0 - 17.0 g/dL   HCT 22.1 (L) 39.0 - 52.0 %   MCV 99.5 78.0 - 100.0 fL   MCH 33.8  26.0 - 34.0 pg   MCHC 33.9 30.0 - 36.0 g/dL   RDW 14.5 11.5 - 15.5 %   Platelets 90 (L) 150 - 400 K/uL    Comment: SPECIMEN CHECKED FOR CLOTS   Neutrophils Relative % 69 %   Neutro Abs 6.4 1.7 - 7.7 K/uL   Lymphocytes Relative 17 %   Lymphs Abs 1.6 0.7 - 4.0 K/uL   Monocytes Relative 12 %   Monocytes Absolute 1.1 (H) 0.1 - 1.0 K/uL   Eosinophils Relative 2 %   Eosinophils Absolute 0.2 0.0 - 0.7 K/uL   Basophils Relative 0 %   Basophils Absolute 0.0 0.0 - 0.1 K/uL  Comprehensive metabolic panel     Status: Abnormal   Collection Time: 06/14/2016  9:03 PM  Result Value Ref Range   Sodium 132 (L) 135 - 145 mmol/L   Potassium 5.2 (H) 3.5 - 5.1 mmol/L   Chloride 105 101 - 111 mmol/L   CO2 21 (L) 22 - 32 mmol/L   Glucose, Bld 252 (H) 65 - 99 mg/dL   BUN 13 6 - 20 mg/dL   Creatinine, Ser 1.28 (H) 0.61 - 1.24 mg/dL   Calcium 7.6 (L) 8.9 - 10.3 mg/dL   Total Protein 5.3 (L) 6.5 - 8.1 g/dL   Albumin 1.9 (L) 3.5 - 5.0 g/dL   AST 29 15 - 41 U/L   ALT 21 17 - 63 U/L   Alkaline Phosphatase 100 38 - 126 U/L   Total Bilirubin 3.7 (H) 0.3 - 1.2 mg/dL   GFR calc non Af Amer 60 (L) >60 mL/min   GFR calc Af Amer >60 >60 mL/min    Comment: (NOTE) The eGFR has been calculated using the CKD EPI equation. This calculation has not been validated in all clinical situations. eGFR's persistently <60 mL/min signify possible Chronic Kidney Disease.    Anion gap 6 5 - 15  Protime-INR     Status: Abnormal   Collection Time: 06/06/2016  9:03 PM  Result Value Ref Range   Prothrombin Time 22.3 (H) 11.6 - 15.2 seconds   INR 1.96 (H) 0.00 - 1.49  Type and screen [facility]     Status: None (Preliminary result)   Collection Time: 06/05/2016  9:03 PM  Result Value Ref Range   ABO/RH(D)  O POS    Antibody Screen NEG    Sample Expiration 05/27/2016    Unit Number [id number]    Blood Component Type RED CELLS,LR    Unit division 00    Status of Unit ISSUED    Transfusion Status OK TO TRANSFUSE     Crossmatch Result Compatible    Unit Number [id number]    Blood Component Type RED CELLS,LR    Unit division 00    Status of Unit ISSUED    Transfusion Status OK TO TRANSFUSE    Crossmatch Result Compatible   Ammonia     Status: Abnormal   Collection Time: 05/27/2016  9:03 PM  Result Value Ref Range   Ammonia 72 (H) 9 - 35 umol/L  Prepare RBC     Status: None   Collection Time: 06/18/2016  9:03 PM  Result Value Ref Range   Order Confirmation ORDER PROCESSED BY BLOOD BANK   Prepare fresh frozen plasma     Status: None (Preliminary result)   Collection Time: 06/22/2016  9:03 PM  Result Value Ref Range   Unit Number [id number]    Blood Component Type THWPLS APHR1    Unit division 00    Status of Unit ISSUED    Transfusion Status OK TO TRANSFUSE    Unit Number [id number]    Blood Component Type THAWED PLASMA    Unit division 00    Status of Unit ISSUED    Transfusion Status OK TO TRANSFUSE   I-Stat CG4 Lactic Acid, ED     Status: Abnormal   Collection Time: 05/26/2016  9:10 PM  Result Value Ref Range   Lactic Acid, Venous 3.62 (HH) 0.5 - 1.9 mmol/L   Comment NOTIFIED PHYSICIAN   I-stat chem 8, ed     Status: Abnormal   Collection Time: 06/07/2016  9:10 PM  Result Value Ref Range   Sodium 136 135 - 145 mmol/L   Potassium 4.7 3.5 - 5.1 mmol/L   Chloride 101 101 - 111 mmol/L   BUN 11 6 - 20 mg/dL   Creatinine, Ser 1.30 (H) 0.61 - 1.24 mg/dL   Glucose, Bld 245 (H) 65 - 99 mg/dL   Calcium, Ion 1.13 1.13 - 1.30 mmol/L   TCO2 21 0 - 100 mmol/L   Hemoglobin 8.2 (L) 13.0 - 17.0 g/dL   HCT 24.0 (L) 39.0 - 52.0 %  I-Stat CG4 Lactic Acid, ED     Status: Abnormal   Collection Time: 05/25/16 12:10 AM  Result Value Ref Range   Lactic Acid, Venous 3.60 (HH) 0.5 - 1.9 mmol/L   Comment NOTIFIED PHYSICIAN   Hemoglobin and hematocrit, blood     Status: Abnormal   Collection Time: 05/25/16  2:15 AM  Result Value Ref Range   Hemoglobin 5.8 (LL) 13.0 - 17.0 g/dL    Comment: RESULT REPEATED  AND VERIFIED CRITICAL RESULT CALLED TO, READ BACK BY AND VERIFIED WITH:  PHILLIPS,C @ 0255 ON 05/25/16 BY WOODIE,J    HCT 16.9 (L) 39.0 - 52.0 %  Glucose, capillary     Status: Abnormal   Collection Time: 05/25/16 10:09 AM  Result Value Ref Range   Glucose-Capillary 317 (H) 65 - 99 mg/dL    No results found.  ROS:ROS 12 systems reviewed and negative except as stated in HPI   Blood pressure 95/55, pulse 82, temperature 98.5 F (36.9 C), temperature source Oral, resp. rate 21, height 5' 5"  (1.651 m), weight 117.8 kg (259 lb 11.2 oz), SpO2 96 %.  PHYSICAL EXAM: General appearance - alert, well appearing, and in no distress Nose - right nasal packing in place, no active bleeding. Left nasal passageway shows some dry crusted blood. Mouth - Dry oral mucosa, no mass or lesion. No active oropharyngeal bleeding, patient has some dried blood in the oropharynx. Neck - supple, no significant adenopathy  Studies Reviewed: Above lab work  Assessment/Plan: Patient presents to [facility] for management of severe acute epistaxis. The patient has right nasal packing in place and after several units of FFP has no active bleeding. Given his history and findings with chronic liver dysfunction, anemia and coagulopathy, I would recommend admission and monitoring with treatment for anemia and coagulopathy per medical service. Monitor for additional significant bleeding, please reconsult as needed. Plan discharge when stable and outpatient follow-up in our office for packing removal. With nasal packing in place, the patient should be on oral antibiotics such as Augmentin or Keflex. Recommend epistaxis precautions as outlined below.  1. Limited activity 2. Liquid and soft diet as tolerated 3. May bathe and shower 4. Saline nasal spray - 4 puffs/nostril every 4 hrs while awake 5. Elevate Head of Bed 6. No nose blowing  Contact our office for outpatient follow-up and packing removal in 5-7 days.  Please reconsult as needed if any significant further bleeding.   [place], Keilana Morlock 05/25/2016, 11:58 AM

## 2016-05-25 NOTE — Progress Notes (Signed)
Called by RN to evaluate patient due to copious epistaxis from right nare, approximated by RN to be about 60-80 mL. Patient was seen by EDP earlier today and Rhino Rocket was placed however he continues to bleed despite this. He has a history of hepatic cirrhosis with an INR of 1.96 and a platelet count of 90. This epistaxis is hemodynamically significant with hypotension and drop in hemoglobin to 5.8. 2 units of PRBC have been given, he has been given a total of 15 mg of vitamin K. I have added 2 units of FFP which will be given in transport. Given significant epistaxis I have consulted ENT, Dr. Wilburn Cornelia, who agrees to see patient urgently upon arrival to [facility]. Patient will be transferred today to be evaluated by specialty services as ENT is not available at [facility].  Domingo Mend, MD [facility] Pager: [phone]  Prolonged time spent: 60 minutes

## 2016-05-25 NOTE — Progress Notes (Signed)
Approximately 0030 transferred patient from stretcher to bed and immediately his nose started bleeding profusely.  On call MD was notified and  MD came to see the patient. Emergency room doctor and on call MD came to the floor and placed a rhino rocket to the right nare.  10 mg of Vitamin K was also ordered and given to the patient.  Patient lost large amount of blood and started feeling light headed during rhino rocket placement.  Patients BP was rechecked at this point 78/45,  MD notified.  250 bolus ordered and given recheck of BP was 85/48.  Order to move patient to stepdown given.  Will call report and transfer patient.

## 2016-05-25 NOTE — H&P (Signed)
TRH H&P   Patient Demographics:    Kyle [name], is a 60 y.o. male  MRN: [id number]   DOB - Jul 18, 1956  Admit Date - 06/11/2016  Outpatient Primary MD for the patient is Odette Fraction, MD  Referring MD/NP/PA: Dr Roderic Palau  Patient coming from: home   Chief Complaint  Patient presents with  . Epistaxis      HPI:    Kyle [name]  is a 60 y.o. male, History of diabetes mellitus, liver cirrhosis, bladder cancer, CKD stage III, thrombocytopenia,  esophageal varices, diastolic heart failure who came to the hospital with nosebleed. Nosebleed had resolved by the time patient reached the hospital. He denies vomiting blood. Denies any bloody bowel movement. He denies chest pain or shortness of breath. In the ED patient was found to be hypotensive, with elevated lactate. He denies any fever. No dysuria    Review of systems:    In addition to the HPI above,  No Fever-chills, No Headache, No changes with Vision or hearing, No problems swallowing food or Liquids, No Chest pain, Cough or Shortness of Breath, No Abdominal pain, No Nausea or vomiting, Bowel movements are regular, No Blood in stool or Urine, No dysuria, No new skin rashes or bruises, No new joints pains-aches,  No new weakness, tingling, numbness in any extremity, No recent weight gain or loss, No polyuria, polydypsia or polyphagia, No significant Mental Stressors.  A full 10 point Review of Systems was done, except as stated above, all other Review of Systems were negative.   With Past History of the following :    Past Medical History  Diagnosis Date  . DM (diabetes mellitus) ([place])   . Cirrhosis ([place])     bx proven steatohepatitis with cirrhosis (2010); per note from Feb 2011, received Hep A and B vaccines in 2010.  [name] HTN (hypertension)   . RLS (restless legs syndrome)   . Hyperlipidemia   . IDA (iron deficiency anemia)   .  GERD (gastroesophageal reflux disease)   . Depression   . Peripheral neuropathy ([place])   . Urothelial cancer ([place])     2010, paillary low-grade, h/o recurrence 2011, recurrence 02/2015  . B12 deficiency   . Psoriasis   . 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 Anemia due to multiple mechanisms 05/13/2011  . History of alcohol abuse 05/13/2011  . ANEMIA-IRON DEFICIENCY 03/09/2009  . S/P endoscopy May 2012    4 columns grade II esophageal varices; due for repeat in Nov 2013   . S/P colonoscopy May 2012    Tubular adenoma  . Low back pain     possible bulging disc, bone scan   . Cancer ([place])     bladder ca 05/2009 removal and  w/chemo wash  . Esophageal varices with bleeding(456.0) 11/24/11    s/p emergent EGD 11/25/11 by Dr. Hilarie Fredrickson at [facility], Grade III esophageal varices s/p banding X 5  . Small bowel obstruction ([place]) 02/15/2012    Admitted to [facility], managed by Dr. Geroge Baseman, ventral  hernia manually reduced  . Ventral hernia 02/15/12  . MRSA (methicillin resistant Staphylococcus aureus)   . Acute post-ligation esophageal ulcer with hemorrhage 04/11/2012  . Clostridium difficile infection   . H. pylori infection 12/24/2012  . Sleep apnea     [facility] took him off CPAP when had tips procedure  . Sleep apnea   . Cellulitis 04/2014    right upper leg  . Iron deficiency anemia secondary to blood loss (chronic) 03/09/2009    Secondary to GI blood loss- Rectal and esophageal varices, portal gastropathy, and esophageal ulcers    . Bladder cancer ([place])   . Chronic diastolic CHF (congestive heart failure) ([place]) 03/21/2016    01/2016 echo: EF 60-65%; LVH; grade 2 diastolic dysfunction.  . CKD (chronic kidney disease), stage III 03/21/2016  . DKA, type 2 ([place]) 03/19/2016      Past Surgical History  Procedure Laterality Date  . Carpel tunnel Right   . Shoulder surgery Left   . Adrenal mass surgery  05/2009    benign, left  . Bladder surgery  01/2009 and 06/2010    cancer 2010, small recurrence in  06/2010  . Colonoscopy  04/2009    moderate int hemorrhoids, rare sigmoid diverticula, one mm sessile hyperplastic rectal polyp  . Esophagogastroduodenoscopy  03/2009    small hh  . Small bowel capsule endoscopy  03/2009    couple of small benign appearing erosions, nonbleeding  . Egd/tcs  08/2007    small hiatal hernia, pancolonic diverticula, friable anal canal, 3cm salmon colored epithelium in distal esophagus, bx negative for Barrett's  . Skin cancer excision  Oct 2012    left arm  . Esophagogastroduodenoscopy  11/25/2011    Dr. Phineas Douglas III varices in the mid and distal esophagus, banding placed, portal astropathy  . Esophagogastroduodenoscopy  01/14/2012    Dr Rourk->4-5 columns Gr2 varices, 6 bands placed, HH, distal esophageal ulcer, portal gastropathy, antral erosions  . Esophagogastroduodenoscopy  03/17/2012    Dr. Larina Bras varices status post band ligation. Hiatal hernia. Portal gastropathy.  . Umbilical hernia repair  04/05/2012    Procedure: HERNIA REPAIR UMBILICAL ADULT;  Surgeon: Donato Heinz, MD;  Location: [facility];  Service: General;  Laterality: N/A;  . Esophagogastroduodenoscopy  04/11/2012    Dr. Bridgett Larsson ulcer possibly at previous banding sites with stigmata     of bleeding.  Attempt at hemostasis with hemoclip and banding was not successful resulting in recurrence of bleed/  Portal gastropathy, but no evidence of gastric varices/ Recurrent esophageal varices grade 2.  . Esophagogastroduodenoscopy  05/24/2012    Dr. Larina Bras varices, portal gastropathy  . Esophagogastroduodenoscopy  04/11/12    Dr. Drue Novel in the distal esophagus adherent lot and visible vessel treated with bicap. grade I varices in the distal esophagus, one ligating band placed, portal hypertensive gastropathy in the body of the stomach.  . Colonoscopy  09/29/2012    [id number] varices. Scattered pancolonic diverticula/single cecal polyp-removed as described above  .  Esophagogastroduodenoscopy  12/24/2012    Dr. Lynwood Dawley significant upper GI bleed secondary to bleeding esophageal varices. s/p hemostasis therapy with injection of a total of 65mL of 5% ethanolamine and application of 9 bands, complete egd not carried out.  . Schlerotherapy  12/24/2012    Procedure: Woodward Ku OF VARICES;  Surgeon: Daneil Dolin, MD;  Location: [facility];  Service: Endoscopy;  Laterality: N/A;  . Esophageal banding  12/24/2012    [id number] significant upper GI bleed secondary to bleeding esophageal varices  .  Tips procedure  12/2012    [facility]  . Cataract extraction, bilateral    . Hernia repair      umbilical  . Eye surgery      bilateral cataract surgery with lens implants  . Transurethral resection of bladder tumor N/A 03/12/2015    Procedure: TRANSURETHRAL RESECTION OF BLADDER TUMOR (TURBT);  Surgeon: Raynelle Bring, MD;  Location: [facility];  Service: Urology;  Laterality: N/A;  . Cystoscopy w/ ureteral stent placement Bilateral 03/12/2015    Procedure: CYSTOSCOPY WITH BILATERAL RETROGRADE PYELOGRAM/RIGHT URETEROSCOPY;  Surgeon: Raynelle Bring, MD;  Location: [facility];  Service: Urology;  Laterality: Bilateral;  . Circumcision N/A 03/12/2015    Procedure: CIRCUMCISION ADULT;  Surgeon: Raynelle Bring, MD;  Location: [facility];  Service: Urology;  Laterality: N/A;      Social History:     Social History  Substance Use Topics  . Smoking status: Current Every Day Smoker -- 1.00 packs/day for 30 years    Types: Cigarettes  . Smokeless tobacco: Never Used  . Alcohol Use: No     Comment: drank heavily for few years in 72s       Family History :     Family History  Problem Relation Age of Onset  . Cirrhosis Father     etoh  . Colon cancer Neg Hx   . Anesthesia problems Neg Hx   . Hypotension Neg Hx   . Malignant hyperthermia Neg Hx   . Pseudochol deficiency Neg Hx   . Kidney cancer Mother   . Cancer Mother   . HIV Brother   . Cirrhosis Brother      nash      Home Medications:   Prior to Admission medications   Medication Sig Start Date End Date Taking? Authorizing Provider  ACCU-CHEK AVIVA PLUS test strip USE TEST STRIPS TO CHECK BLOOD SUGAR THREE TIMES DAILY AS DIRECTED 12/17/15  Yes Orlena Sheldon, PA-C  furosemide (LASIX) 40 MG tablet Take 2 tablets (80 mg total) by mouth daily. 05/21/16  Yes Susy Frizzle, MD  Insulin Glargine (TOUJEO SOLOSTAR) 300 UNIT/ML SOPN Inject 25 Units into the skin 2 (two) times daily. 05/17/16  Yes Erline Hau, MD  Insulin Pen Needle 29G X 12.7MM MISC Use with Toujeo solostar 09/10/15  Yes Susy Frizzle, MD  insulin regular human CONCENTRATED (HUMULIN R) 500 UNIT/ML injection Inject 0.6 mLs (300 Units total) into the skin 3 (three) times daily. 04/25/16  Yes Susy Frizzle, MD  Insulin Syringes, Disposable, (B-D INSULIN SYRINGE 1CC) U-100 1 ML MISC Inject 60 Units into the skin 3 (three) times daily. 04/16/16  Yes Susy Frizzle, MD  lactulose (CHRONULAC) 10 GM/15ML solution Take 45 mLs (30 g total) by mouth 3 (three) times daily. 03/21/16  Yes Rexene Alberts, MD  metoCLOPramide (REGLAN) 10 MG tablet Take 1 tablet (10 mg total) by mouth 3 (three) times daily before meals. 04/25/16  Yes Susy Frizzle, MD  omeprazole (PRILOSEC) 40 MG capsule TAKE ONE CAPSULE BY MOUTH ONCE DAILY 12/24/15  Yes Carlis Stable, NP  oxyCODONE (ROXICODONE) 5 MG immediate release tablet Take 1 tablet (5 mg total) by mouth every 4 (four) hours as needed for severe pain. 07/02/15  Yes Tanna Furry, MD  propranolol (INDERAL) 20 MG tablet TAKE ONE TABLET BY MOUTH TWICE DAILY 03/03/16  Yes Orvil Feil, NP  spironolactone (ALDACTONE) 50 MG tablet Take 50 mg by mouth 2 (two) times daily.  03/03/14  Yes Historical  Provider, MD  XIFAXAN 550 MG TABS tablet TAKE ONE TABLET BY MOUTH TWICE DAILY 03/03/16  Yes Orvil Feil, NP     Allergies:     Allergies  Allergen Reactions  . Aspirin Other (See Comments)    Harms liver.   .  Ibuprofen Other (See Comments)    Can't take per Dr Gala Romney- harms livers  . Tylenol [Acetaminophen] Other (See Comments)    Restless Legs      Physical Exam:   Vitals  Blood pressure 90/56, pulse 73, temperature 98.3 F (36.8 C), temperature source Oral, resp. rate 17, height 5\' 6"  (1.676 m), weight 106.595 kg (235 lb), SpO2 95 %.   1. General Caucasian male* lying in bed in NAD, cooperative with exam  2. Normal affect and insight, Not Suicidal or Homicidal, Awake Alert, Oriented X 3.  3. No F.N deficits, ALL C.Nerves Intact, Strength 5/5 all 4 extremities, Sensation intact all 4 extremities, Plantars down going.  4. Ears and Eyes appear Normal, Conjunctivae clear, PERRLA. Moist Oral Mucosa.  5. Supple Neck, No JVD, No cervical lymphadenopathy appriciated, No Carotid Bruits.  6. Symmetrical Chest wall movement, Good air movement bilaterally, CTAB.  7. RRR, No Gallops, Rubs or Murmurs, No Parasternal Heave.  8. Positive Bowel Sounds, Abdomen Soft, No tenderness, No organomegaly appriciated,No rebound -guarding or rigidity.  9.  No Cyanosis, Normal Skin Turgor, psoriatic lesion noted in the anterior abdominal wall below the umbilicus  10. Good muscle tone,  joints appear normal , no effusions, Normal ROM.      Data Review:    CBC  Recent Labs Lab 06/16/2016 2103 06/20/2016 2110  WBC 9.2  --   HGB 7.5* 8.2*  HCT 22.1* 24.0*  PLT 90*  --   MCV 99.5  --   MCH 33.8  --   MCHC 33.9  --   RDW 14.5  --   LYMPHSABS 1.6  --   MONOABS 1.1*  --   EOSABS 0.2  --   BASOSABS 0.0  --    ------------------------------------------------------------------------------------------------------------------  Chemistries   Recent Labs Lab 06/14/2016 2103 06/22/2016 2110  NA 132* 136  K 5.2* 4.7  CL 105 101  CO2 21*  --   GLUCOSE 252* 245*  BUN 13 11  CREATININE 1.28* 1.30*  CALCIUM 7.6*  --   AST 29  --   ALT 21  --   ALKPHOS 100  --   BILITOT 3.7*  --     Coagulation  profile  Recent Labs Lab 06/12/2016 2103  INR 1.96*   ------------------------------------------------------------------------------------------------------------------    Component Value Date/Time   BNP 52.0 05/15/2016 2345   BNP 63.7 03/05/2016 1221     ---------------------------------------------------------------------------------------------------------------  Urinalysis    Component Value Date/Time   COLORURINE YELLOW 03/19/2016 1720   APPEARANCEUR CLEAR 03/19/2016 1720   LABSPEC 1.015 03/19/2016 1720   PHURINE 5.5 03/19/2016 1720   GLUCOSEU >1000* 03/19/2016 1720   HGBUR NEGATIVE 03/19/2016 1720   HGBUR trace-intact 03/09/2009 1039   BILIRUBINUR NEGATIVE 03/19/2016 1720   KETONESUR NEGATIVE 03/19/2016 1720   PROTEINUR NEGATIVE 03/19/2016 1720   UROBILINOGEN 0.2 07/02/2015 1305   NITRITE NEGATIVE 03/19/2016 1720   LEUKOCYTESUR NEGATIVE 03/19/2016 1720    ----------------------------------------------------------------------------------------------------------------   Imaging Results:    No results found.    Assessment & Plan:    Active Problems:   Thrombocytopenia due to hypersplenism   Liver cirrhosis secondary to [name]   Epistaxis   Hypotension     1.  Epistaxis- resolved, patient's INR is 1.96. Will closely monitor patient. Consider vitamin K if epistaxis recurs. 2. Hypotension- patient is on diuretics including Lasix and spironolactone. We'll hold diuretics and start gentle IV hydration. Lactic acid is elevated secondary to hypotension. Follow serial lactic acid levels. 3. Liver cirrhosis secondary to [facility]- stable, AST 29, ALT 21, total bili 3.7. Follow CMP in a.m . continue lactulose, will hold Inderal due to hypotension 4. Diabetes mellitus- continue Lantus, Humulin R 5. Anemia- patient's hemoglobin has dropped to 8.2, was 10.5 , 2 weeks ago. Follow CBC at a.m. and transfuse for hemoglobin less than 7   DVT Prophylaxis-   SCDs   AM Labs Ordered,  also please review Full Orders  Family Communication: Admission, patients condition and plan of care including tests being ordered have been discussed with the patient and his wife and daughter at bedside who indicate understanding and agree with the plan and Code Status.  Code Status:  Full code  Admission status: Observation  Time spent in minutes : 55 min   Robbi Scurlock S M.D on 05/25/2016 at 12:14 AM  Between 7am to 7pm - Pager - [phone]. After 7pm go to [email or web address] - password [facility]  [facility] - Office  [phone]

## 2016-05-25 NOTE — Progress Notes (Signed)
Pt just arrived from 45M. No distress noted. VS stable.Will continue to monitor

## 2016-05-25 NOTE — Progress Notes (Signed)
Patient ID: Kyle [name], male   DOB: 29-Nov-1955, 60 y.o.   MRN: [id number]   D/w Dr. Wilburn Cornelia.  No active bleeding visualized.  He recommended leaving packing in, for the next 5 days.  Check cbc, cmp, ptt , pt/inr in am.  May need more vitamin K if coagulopathic

## 2016-05-26 LAB — TYPE AND SCREEN
ABO/RH(D): O POS
ANTIBODY SCREEN: NEGATIVE
Unit division: 0
Unit division: 0

## 2016-05-26 LAB — CBC
HCT: 24.8 % — ABNORMAL LOW (ref 39.0–52.0)
HCT: 24.7 % — ABNORMAL LOW (ref 39.0–52.0)
Hemoglobin: 8.2 g/dL — ABNORMAL LOW (ref 13.0–17.0)
Hemoglobin: 8.3 g/dL — ABNORMAL LOW (ref 13.0–17.0)
MCH: 29.3 pg (ref 26.0–34.0)
MCH: 29.2 pg (ref 26.0–34.0)
MCHC: 33.1 g/dL (ref 30.0–36.0)
MCHC: 33.6 g/dL (ref 30.0–36.0)
MCV: 88.6 fL (ref 78.0–100.0)
MCV: 87 fL (ref 78.0–100.0)
PLATELETS: 52 10*3/uL — AB (ref 150–400)
PLATELETS: 54 10*3/uL — AB (ref 150–400)
RBC: 2.8 MIL/uL — AB (ref 4.22–5.81)
RBC: 2.84 MIL/uL — ABNORMAL LOW (ref 4.22–5.81)
RDW: 23.3 % — ABNORMAL HIGH (ref 11.5–15.5)
RDW: 23.5 % — AB (ref 11.5–15.5)
WBC: 7.5 10*3/uL (ref 4.0–10.5)
WBC: 7.5 10*3/uL (ref 4.0–10.5)

## 2016-05-26 LAB — APTT: APTT: 35 s (ref 24–37)

## 2016-05-26 LAB — PREPARE FRESH FROZEN PLASMA
UNIT DIVISION: 0
Unit division: 0

## 2016-05-26 LAB — PREPARE RBC (CROSSMATCH)

## 2016-05-26 LAB — GLUCOSE, CAPILLARY
GLUCOSE-CAPILLARY: 146 mg/dL — AB (ref 65–99)
Glucose-Capillary: 223 mg/dL — ABNORMAL HIGH (ref 65–99)
Glucose-Capillary: 315 mg/dL — ABNORMAL HIGH (ref 65–99)
Glucose-Capillary: 201 mg/dL — ABNORMAL HIGH (ref 65–99)

## 2016-05-26 LAB — HEMOGLOBIN AND HEMATOCRIT, BLOOD
HCT: 20.8 % — ABNORMAL LOW (ref 39.0–52.0)
Hemoglobin: 6.9 g/dL — CL (ref 13.0–17.0)

## 2016-05-26 LAB — PROTIME-INR
INR: 1.84 — ABNORMAL HIGH (ref 0.00–1.49)
PROTHROMBIN TIME: 21.2 s — AB (ref 11.6–15.2)

## 2016-05-26 MED ORDER — AMOXICILLIN-POT CLAVULANATE 875-125 MG PO TABS
1.0000 | ORAL_TABLET | Freq: Two times a day (BID) | ORAL | Status: DC
  Administered 2016-05-26 – 2016-05-27 (×4): 1 via ORAL
  Filled 2016-05-26 (×4): qty 1

## 2016-05-26 MED ORDER — SODIUM CHLORIDE 0.9 % IV SOLN: Freq: Once | INTRAVENOUS | Status: DC

## 2016-05-26 MED ORDER — SODIUM CHLORIDE 0.9 % IV SOLN
Freq: Once | INTRAVENOUS | Status: DC
Start: 2016-05-26 — End: 2016-05-26

## 2016-05-26 MED ORDER — SODIUM CHLORIDE 0.9 % IV SOLN
Freq: Once | INTRAVENOUS | Status: AC
  Administered 2016-05-26: 17:00:00 via INTRAVENOUS

## 2016-05-26 MED ORDER — LACTULOSE 10 GM/15ML PO SOLN
10.0000 g | Freq: Three times a day (TID) | ORAL | Status: DC
  Administered 2016-05-27 (×3): 10 g via ORAL
  Filled 2016-05-26 (×3): qty 15

## 2016-05-26 MED ORDER — INSULIN ASPART 100 UNIT/ML ~~LOC~~ SOLN
12.0000 [IU] | Freq: Three times a day (TID) | SUBCUTANEOUS | Status: DC
  Administered 2016-05-26 – 2016-05-27 (×5): 12 [IU] via SUBCUTANEOUS

## 2016-05-26 MED ORDER — INSULIN ASPART 100 UNIT/ML ~~LOC~~ SOLN
0.0000 [IU] | Freq: Three times a day (TID) | SUBCUTANEOUS | Status: DC
Start: 2016-05-26 — End: 2016-05-28
  Administered 2016-05-26: 15 [IU] via SUBCUTANEOUS
  Administered 2016-05-26: 7 [IU] via SUBCUTANEOUS
  Administered 2016-05-27: 15 [IU] via SUBCUTANEOUS
  Administered 2016-05-27: 3 [IU] via SUBCUTANEOUS

## 2016-05-26 MED ORDER — INSULIN ASPART 100 UNIT/ML ~~LOC~~ SOLN: 0.0000 [IU] | Freq: Every day | SUBCUTANEOUS | Status: DC

## 2016-05-26 MED ORDER — SODIUM CHLORIDE 0.9 % IV SOLN
Freq: Once | INTRAVENOUS | Status: AC
  Administered 2016-05-26: 04:00:00 via INTRAVENOUS

## 2016-05-26 NOTE — Progress Notes (Signed)
CRITICAL VALUE ALERT  Critical value received:  Hgb 6.9  Date of notification:  05/26/2016   Time of notification:  0145  Critical value read back:Yes.    Nurse who received alert:  Reap, Jon Gills   MD notified (1st page):  [place]  Time of first page:  0145  MD notified (2nd page): [place]  Time of second page: 0215  Responding MD:    Time MD responded:

## 2016-05-26 NOTE — Progress Notes (Signed)
Kyle [name] - Stepdown/ICU TEAM  Kyle [name]  [id number] DOB: 07-24-1956 DOA: 06/20/2016 PCP: Odette Fraction, MD    Brief Narrative:  60 y.o. Male w/ a hx of DM, cirrhosis, bladder cancer, CKD stage III, thrombocytopenia, esophageal varices, and diastolic heart failure who came to the hospital with a nosebleed. Nosebleed had resolved by the time patient reached the hospital. He denied vomiting blood. Denies any bloody bowel movements.   In the ED the patient was found to be hypotensive, with elevated lactate.  Shortly after being transferred to the floor the patient developed recurrence of his nosebleed, with recurrent hypotension.  A Rhino Rocket was placed in his right nare, but his bleeding persisted prompting consultation with ENT.   Subjective: The patient is sitting up in a bedside chair.  He denies chest pain shortness breath fevers chills nausea or vomiting.  He reports some low-grade minor dripping of blood from his nose.  Assessment & Plan:  Severe Acute Epistaxis ENT has evaluated - antibiotic therapy recommended while nasal packing in place - to follow-up in ENT office and 5-7 days for packing removal - see ENT note for detailed recommendations  Acute blood loss anemia Hemoglobin 10.52 weeks prior to admission and 8.2 at presentation - nadir of 5.8 and thus far has received 5 units PRBC - recheck CBC now  Coagulopathy due to liver disease Dosed with vitamin K and FFP - INR [name].96 at presentation - recheck coags now  Hypotension Due to above - responding to volume expansion/transfusions  Cirrhosis of the liver / [name] - Hx of TIPS Ammonia 72 at presentation but patient alert oriented and interactive - holding diuretics due to hypotension  DM2 Poorly controlled - adjust treatment and follow  Chronic kidney disease stage III Baseline creatinine appears to be approximately [name].5 - stable presently  Bladder cancer status post resection  Chronic diastolic  congestive heart failure EF 60% with grade 2 DD per TTE March 2017  DVT prophylaxis: SCDs Code Status: FULL CODE Family Communication: no family present at time of exam  Disposition Plan: SDU until clear bleeding has stopped  Consultants:  ENT  Procedures: None  Antimicrobials:  Augmentin 7/3 >  Objective: Blood pressure 99/63, pulse 83, temperature 98.5 F (36.9 C), temperature source Oral, resp. rate 17, height 5\' 7"  ([name].702 m), weight 119.[name] kg (262 lb 9.[name] oz), SpO2 98 %.  Intake/Output Summary (Last 24 hours) at 05/26/16 0856 Last data filed at 05/26/16 [id number]  Gross per 24 hour  Intake 3901.25 ml  Output      0 ml  Net 3901.25 ml   Filed Weights   06/09/2016 2003 05/25/16 0247 05/25/16 1230  Weight: 106.595 kg (235 lb) 117.8 kg (259 lb 11.2 oz) 119.[name] kg (262 lb 9.[name] oz)    Examination: General: No acute respiratory distress - nasal packing in place right nare Lungs: Clear to auscultation bilaterally without wheezes or crackles Cardiovascular: Regular rate and rhythm without murmur gallop or rub normal S1 and S2 Abdomen: Nontender, nondistended, soft, bowel sounds positive, no rebound, no ascites, no appreciable mass Extremities: No significant cyanosis, or clubbing;  trace edema bilateral lower extremities  CBC:  Recent Labs Lab 06/20/2016 2103 06/20/2016 2110 05/25/16 0215 05/25/16 1203 05/26/16 0102  WBC 9.2  --   --  10.[name]  --   NEUTROABS 6.4  --   --   --   --   HGB 7.5* 8.2* 5.8* 7.3* 6.9*  HCT 22.[name]* 24.0* 16.9* 21.4* 20.8*  MCV 99.5  --   --  94.3  --   PLT 90*  --   --  77*  --    Basic Metabolic Panel:  Recent Labs Lab 05/25/2016 2103 06/17/2016 2110 05/25/16 1203 05/25/16 1942  NA 132* 136 133* 134*  K 5.2* 4.7 6.2* 5.[name]  CL 105 101 109 106  CO2 21*  --  15* 17*  GLUCOSE 252* 245* 300* 276*  BUN 13 11 20  23*  CREATININE [name].28* [name].30* [name].41* [name].50*  CALCIUM 7.6*  --  7.7* 8.2*   GFR: Estimated Creatinine Clearance: 65.5 mL/min (by C-G formula based on  Cr of [name].5).  Liver Function Tests:  Recent Labs Lab 06/16/2016 2103  AST 29  ALT 21  ALKPHOS 100  BILITOT 3.7*  PROT 5.3*  ALBUMIN [name].9*   No results for input(s): LIPASE, AMYLASE in the last 168 hours.  Recent Labs Lab 05/27/2016 2103  AMMONIA 72*    Coagulation Profile:  Recent Labs Lab 05/27/2016 2103  INR [name].96*   HbA1C: HGB A1C MFR BLD  Date/Time Value Ref Range Status  03/19/2016 04:19 PM 11.0* 4.8 - 5.6 % Final    Comment:    (NOTE)         Pre-diabetes: 5.7 - 6.4         Diabetes: >6.4         Glycemic control for adults with diabetes: <7.0   08/30/2015 12:53 PM 10.6* <5.7 % Final    Comment:                                                                           According to the ADA Clinical Practice Recommendations for 2011, when HbA1c is used as a screening test:     >=6.5%   Diagnostic of Diabetes Mellitus            (if abnormal result is confirmed)   5.7-6.4%   Increased risk of developing Diabetes Mellitus   References:Diagnosis and Classification of Diabetes Mellitus,Diabetes [id number] [name]):S62-S69 and Standards of Medical Care in         Diabetes - 2011,Diabetes [id number] (Suppl [name]):S11-S61.       CBG:  Recent Labs Lab 06/03/2016 2020 05/25/16 1009 05/25/16 1607 05/25/16 2110 05/26/16 0757  GLUCAP 258* 317* 322* 273* 223*    Recent Results (from the past 240 hour(s))  MRSA PCR Screening     Status: None   Collection Time: 05/25/16 12:46 PM  Result Value Ref Range Status   MRSA by PCR NEGATIVE NEGATIVE Final    Comment:        The GeneXpert MRSA Assay (FDA approved for NASAL specimens only), is one component of a comprehensive MRSA colonization surveillance program. It is not intended to diagnose MRSA infection nor to guide or monitor treatment for MRSA infections.      Scheduled Meds: . sodium chloride   Intravenous Once  . insulin aspart  0-5 Units Subcutaneous QHS  . insulin aspart  0-9 Units Subcutaneous  TID WC  . insulin glargine  25 Units Subcutaneous BID  . lactulose  30 g Oral TID  . metoCLOPramide  10 mg Oral TID AC  . pantoprazole  40 mg Oral Daily  .  rifaximin  550 mg Oral BID   Continuous Infusions: . sodium chloride 125 mL/hr at 05/25/16 1828     LOS: [name] day   Time spent: 35 minutes   Cherene Altes, MD [facility] Office  [phone] Pager - Text Page per Shea Evans as per below:  On-Call/Text Page:      [email or web address]      password [email or web address]  If 7PM-7AM, please contact night-coverage [email or web address] Password [email or web address] 05/26/2016, 8:56 AM

## 2016-05-26 NOTE — Progress Notes (Signed)
Inpatient Diabetes Program Recommendations  AACE/ADA: New Consensus Statement on Inpatient Glycemic Control (2015)  Target Ranges:  Prepandial:   less than 140 mg/dL      Peak postprandial:   less than 180 mg/dL (1-2 hours)      Critically ill patients:  140 - 180 mg/dL   Lab Results  Component Value Date   GLUCAP 223* 05/26/2016   HGBA1C 11.0* 03/19/2016  Results for [name], Kyle WURDEMAN (MRN [id number]) as of 05/26/2016 09:59  Ref. Range 05/25/2016 10:09 05/25/2016 16:07 05/25/2016 21:10 05/26/2016 07:57  Glucose-Capillary Latest Ref Range: 65-99 mg/dL 317 (H) 322 (H) 273 (H) 223 (H)    Review of Glycemic Control  Diabetes history: DM2 Outpatient Diabetes medications: Toujeo 25 units bid (has been on U-500) Current orders for Inpatient glycemic control: Novolog 12 units tid, Lantus 25 units bid, Novolog resistant tidwc and hs  Inpatient Diabetes Program Recommendations:    Increase Lantus to 28 units bid - continue to titrate until FBS < 180 mg/dL  Will follow. Thank you. Lorenda Peck, RD, LDN, CDE Inpatient Diabetes Coordinator [phone]

## 2016-05-27 DIAGNOSIS — E1165 Type 2 diabetes mellitus with hyperglycemia: Secondary | ICD-10-CM

## 2016-05-27 DIAGNOSIS — I9589 Other hypotension: Secondary | ICD-10-CM

## 2016-05-27 DIAGNOSIS — E1121 Type 2 diabetes mellitus with diabetic nephropathy: Secondary | ICD-10-CM

## 2016-05-27 DIAGNOSIS — I10 Essential (primary) hypertension: Secondary | ICD-10-CM

## 2016-05-27 LAB — COMPREHENSIVE METABOLIC PANEL
ALT: 56 U/L (ref 17–63)
AST: 80 U/L — ABNORMAL HIGH (ref 15–41)
Albumin: 2.2 g/dL — ABNORMAL LOW (ref 3.5–5.0)
Alkaline Phosphatase: 52 U/L (ref 38–126)
Anion gap: 5 (ref 5–15)
BUN: 15 mg/dL (ref 6–20)
CHLORIDE: 110 mmol/L (ref 101–111)
CO2: 22 mmol/L (ref 22–32)
CREATININE: 1.29 mg/dL — AB (ref 0.61–1.24)
Calcium: 8.6 mg/dL — ABNORMAL LOW (ref 8.9–10.3)
GFR, EST NON AFRICAN AMERICAN: 59 mL/min — AB (ref >60)
Glucose, Bld: 115 mg/dL — ABNORMAL HIGH (ref 65–99)
Potassium: 3.9 mmol/L (ref 3.5–5.1)
Sodium: 137 mmol/L (ref 135–145)
Total Bilirubin: 5.3 mg/dL — ABNORMAL HIGH (ref 0.3–1.2)
Total Protein: 5.3 g/dL — ABNORMAL LOW (ref 6.5–8.1)

## 2016-05-27 LAB — PREPARE FRESH FROZEN PLASMA
UNIT DIVISION: 0
UNIT DIVISION: 0
Unit division: 0

## 2016-05-27 LAB — CBC
HCT: 21.4 % — ABNORMAL LOW (ref 39.0–52.0)
Hemoglobin: 7.2 g/dL — ABNORMAL LOW (ref 13.0–17.0)
MCH: 29.5 pg (ref 26.0–34.0)
MCHC: 33.6 g/dL (ref 30.0–36.0)
MCV: 87.7 fL (ref 78.0–100.0)
PLATELETS: 45 10*3/uL — AB (ref 150–400)
RBC: 2.44 MIL/uL — AB (ref 4.22–5.81)
RDW: 23.2 % — AB (ref 11.5–15.5)
WBC: 5.4 10*3/uL (ref 4.0–10.5)

## 2016-05-27 LAB — HEMOGLOBIN AND HEMATOCRIT, BLOOD
HEMATOCRIT: 24.2 % — AB (ref 39.0–52.0)
HEMOGLOBIN: 8.2 g/dL — AB (ref 13.0–17.0)

## 2016-05-27 LAB — GLUCOSE, CAPILLARY
Glucose-Capillary: 98 mg/dL (ref 65–99)
Glucose-Capillary: 327 mg/dL — ABNORMAL HIGH (ref 65–99)
Glucose-Capillary: 137 mg/dL — ABNORMAL HIGH (ref 65–99)
Glucose-Capillary: 119 mg/dL — ABNORMAL HIGH (ref 65–99)

## 2016-05-27 LAB — PROTIME-INR
INR: 1.74 — AB (ref 0.00–1.49)
INR: 1.67 — ABNORMAL HIGH (ref 0.00–1.49)
PROTHROMBIN TIME: 20.3 s — AB (ref 11.6–15.2)
Prothrombin Time: 19.7 s — ABNORMAL HIGH (ref 11.6–15.2)

## 2016-05-27 LAB — PREPARE RBC (CROSSMATCH)

## 2016-05-27 LAB — AMMONIA: Ammonia: 75 umol/L — ABNORMAL HIGH (ref 9–35)

## 2016-05-27 MED ORDER — ZOLPIDEM TARTRATE 5 MG PO TABS
5.0000 mg | ORAL_TABLET | Freq: Once | ORAL | Status: AC
  Administered 2016-05-27: 5 mg via ORAL
  Filled 2016-05-27: qty 1

## 2016-05-27 MED ORDER — SODIUM CHLORIDE 0.9 % IV SOLN: Freq: Once | INTRAVENOUS | Status: DC

## 2016-05-27 MED ORDER — DEXTROSE 5 % IV SOLN
10.0000 mg | Freq: Once | INTRAVENOUS | Status: AC
  Administered 2016-05-27: 10 mg via INTRAVENOUS
  Filled 2016-05-27: qty 1

## 2016-05-27 NOTE — Progress Notes (Deleted)
Patient refuses offer of transferring to a bariatric bed for more comfort.  

## 2016-05-27 NOTE — Progress Notes (Signed)
PROGRESS NOTE    Kyle [name]  [id number] DOB: [birth year] DOA: 06/15/2016 PCP: Odette Fraction, MD   Brief Narrative:  60 y.o. Male PMHx Depression, DM Type 2 uncontrolled with complications,Peripheral neuropathy , HTN, Chronic Diastolic CHF, HLD, RLS, OSA , Steatohepatitis w/ Liver Cirrhosis,  Esophageal Varices, Acute post-ligation esophageal ulcer with hemorrhage, Bladder Cancer, Paillary Low-grade Urothelial cancer  CKD stage III, Thrombocytopenia,,   Who came to the hospital with a nosebleed. Nosebleed had resolved by the time patient reached the hospital. He denied vomiting blood. Denies any bloody bowel movements.   In the ED the patient was found to be hypotensive, with elevated lactate. Shortly after being transferred to the floor the patient developed recurrence of his nosebleed, with recurrent hypotension. A Rhino Rocket was placed in his right nare, but his bleeding persisted prompting consultation with ENT.    Assessment & Plan:   Principal Problem:   Epistaxis Active Problems:   Essential hypertension   Thrombocytopenia due to hypersplenism   Liver cirrhosis secondary to [name]   Hypotension   DM type 2, uncontrolled, with renal complications (HCC)  Severe Acute Epistaxis ENT has evaluated - antibiotic therapy recommended while nasal packing in place - to follow-up in ENT office and 5-7 days for packing removal - see ENT note for detailed recommendations  Acute blood loss anemia Hemoglobin 10.52 weeks prior to admission and 8.2 at presentation - nadir of 5.8 and thus far has received 5 units PRBC - recheck CBC now  Recent Labs Lab 05/26/16 0102 05/26/16 1307 05/26/16 1610 05/27/16 0423 05/27/16 1536  HGB 6.9* 8.2* 8.3* 7.2* 8.2*  ] Coagulopathy due to liver disease -Patient still bleeding: Dropped H/H overnight vitamin K IV 10 mg -Recheck INR, ammonia, H/H at 1600  Recent Labs Lab 06/02/2016 2103 05/26/16 1307 05/27/16 0423 05/27/16 1536  INR  1.96* 1.84* 1.74* 1.67*   Chronic diastolic CHF -Strict I&O -Daily weight -7/4 transfuse 1 unit RBC  -Transfuse for hemoglobin<8  Hypotension Due to above - responding to volume expansion/transfusions  Cirrhosis of the liver / [name] - Hx of TIPS -Ammonia 72 at presentation but patient alert oriented and interactive - holding diuretics due to hypotension   DM Type 2 uncontrolled with complications -[id number] hemoglobin A1c = 11   Chronic kidney disease stage III(baseline Cr ~1.5) Lab Results  Component Value Date   CREATININE 1.29* 05/27/2016   CREATININE 1.50* 05/25/2016   CREATININE 1.41* 05/25/2016  -At baseline  Bladder cancer status post resection  Chronic diastolic congestive heart failure -EF 60% with grade 2 DD per TTE March 2017     DVT prophylaxis: SCD Code Status: Full Family Communication: None Disposition Plan: Discharge on 7/5 if hemoglobin stable   Consultants:  ENT  Procedures/Significant Events:  NA  Cultures   Antimicrobials: Augmentin 7/3 >   Devices    LINES / TUBES:      Continuous Infusions: . sodium chloride 75 mL/hr at 05/27/16 1600     Subjective: 7/4 A/O 4, NAD  Objective: Filed Vitals:   05/27/16 1039 05/27/16 1200 05/27/16 1419 05/27/16 1618  BP: 117/60 121/70 114/66 109/64  Pulse: 92 89 88 84  Temp: 98.4 F (36.9 C) 98.4 F (36.9 C) 98.1 F (36.7 C) 98.5 F (36.9 C)  TempSrc: Oral Oral Axillary Oral  Resp: 16 24 20 15   Height:      Weight:      SpO2: 97% 99% 100% 94%    Intake/Output Summary (Last 24 hours) at 05/27/16  2005 Last data filed at 05/27/16 1600  Gross per 24 hour  Intake   2463 ml  Output    900 ml  Net   1563 ml   Filed Weights   06/22/2016 2003 05/25/16 0247 05/25/16 1230  Weight: 106.595 kg (235 lb) 117.8 kg (259 lb 11.2 oz) 119.1 kg (262 lb 9.1 oz)    Examination:  General: No acute respiratory distress - nasal packing in place right nare Lungs: Clear to auscultation bilaterally  without wheezes or crackles Cardiovascular: Regular rate and rhythm without murmur gallop or rub normal S1 and S2 Abdomen: Nontender, nondistended, soft, bowel sounds positive, no rebound, no ascites, no appreciable mass Extremities: No significant cyanosis, or clubbing; trace edema bilateral lower extremities Psychiatric:  Negative depression, negative anxiety, negative fatigue, negative mania  Central nervous system:  Cranial nerves II through XII intact, tongue/uvula midline, all extremities muscle strength 5/5, sensation intact throughout,, negative dysarthria, negative expressive aphasia, negative receptive aphasia.  .     Data Reviewed: Care during the described time interval was provided by me .  I have reviewed this patient's available data, including medical history, events of note, physical examination, and all test results as part of my evaluation. I have personally reviewed and interpreted all radiology studies.  CBC:  Recent Labs Lab 06/21/2016 2103  05/25/16 1203 05/26/16 0102 05/26/16 1307 05/26/16 1610 05/27/16 0423 05/27/16 1536  WBC 9.2  --  10.1  --  7.5 7.5 5.4  --   NEUTROABS 6.4  --   --   --   --   --   --   --   HGB 7.5*  < > 7.3* 6.9* 8.2* 8.3* 7.2* 8.2*  HCT 22.1*  < > 21.4* 20.8* 24.8* 24.7* 21.4* 24.2*  MCV 99.5  --  94.3  --  88.6 87.0 87.7  --   PLT 90*  --  77*  --  52* 54* 45*  --   < > = values in this interval not displayed. Basic Metabolic Panel:  Recent Labs Lab 06/17/2016 2103 06/20/2016 2110 05/25/16 1203 05/25/16 1942 05/27/16 0423  NA 132* 136 133* 134* 137  K 5.2* 4.7 6.2* 5.1 3.9  CL 105 101 109 106 110  CO2 21*  --  15* 17* 22  GLUCOSE 252* 245* 300* 276* 115*  BUN 13 11 20  23* 15  CREATININE 1.28* 1.30* 1.41* 1.50* 1.29*  CALCIUM 7.6*  --  7.7* 8.2* 8.6*   GFR: Estimated Creatinine Clearance: 76.1 mL/min (by C-G formula based on Cr of 1.29). Liver Function Tests:  Recent Labs Lab 06/22/2016 2103 05/27/16 0423  AST 29 80*    ALT 21 56  ALKPHOS 100 52  BILITOT 3.7* 5.3*  PROT 5.3* 5.3*  ALBUMIN 1.9* 2.2*   No results for input(s): LIPASE, AMYLASE in the last 168 hours.  Recent Labs Lab 06/12/2016 2103 05/27/16 1536  AMMONIA 72* 75*   Coagulation Profile:  Recent Labs Lab 05/27/2016 2103 05/26/16 1307 05/27/16 0423 05/27/16 1536  INR 1.96* 1.84* 1.74* 1.67*   Cardiac Enzymes: No results for input(s): CKTOTAL, CKMB, CKMBINDEX, TROPONINI in the last 168 hours. BNP (last 3 results) No results for input(s): PROBNP in the last 8760 hours. HbA1C: No results for input(s): HGBA1C in the last 72 hours. CBG:  Recent Labs Lab 05/26/16 1602 05/26/16 2205 05/27/16 0731 05/27/16 1200 05/27/16 1618  GLUCAP 201* 146* 98 327* 137*   Lipid Profile: No results for input(s): CHOL, HDL, LDLCALC, TRIG, CHOLHDL,  LDLDIRECT in the last 72 hours. Thyroid Function Tests: No results for input(s): TSH, T4TOTAL, FREET4, T3FREE, THYROIDAB in the last 72 hours. Anemia Panel: No results for input(s): VITAMINB12, FOLATE, FERRITIN, TIBC, IRON, RETICCTPCT in the last 72 hours. Urine analysis:    Component Value Date/Time   COLORURINE YELLOW 03/19/2016 1720   APPEARANCEUR CLEAR 03/19/2016 1720   LABSPEC 1.015 03/19/2016 1720   PHURINE 5.5 03/19/2016 1720   GLUCOSEU >1000* 03/19/2016 1720   HGBUR NEGATIVE 03/19/2016 1720   HGBUR trace-intact 03/09/2009 1039   BILIRUBINUR NEGATIVE 03/19/2016 1720   KETONESUR NEGATIVE 03/19/2016 1720   PROTEINUR NEGATIVE 03/19/2016 1720   UROBILINOGEN 0.2 07/02/2015 1305   NITRITE NEGATIVE 03/19/2016 1720   LEUKOCYTESUR NEGATIVE 03/19/2016 1720   Sepsis Labs: @LABRCNTIP (procalcitonin:4,lacticidven:4)  ) Recent Results (from the past 240 hour(s))  MRSA PCR Screening     Status: None   Collection Time: 05/25/16 12:46 PM  Result Value Ref Range Status   MRSA by PCR NEGATIVE NEGATIVE Final    Comment:        The GeneXpert MRSA Assay (FDA approved for NASAL specimens only), is  one component of a comprehensive MRSA colonization surveillance program. It is not intended to diagnose MRSA infection nor to guide or monitor treatment for MRSA infections.          Radiology Studies: No results found.      Scheduled Meds: . sodium chloride   Intravenous Once  . amoxicillin-clavulanate  1 tablet Oral BID  . insulin aspart  0-20 Units Subcutaneous TID WC  . insulin aspart  0-5 Units Subcutaneous QHS  . insulin aspart  12 Units Subcutaneous TID WC  . insulin glargine  25 Units Subcutaneous BID  . lactulose  10 g Oral TID  . metoCLOPramide  10 mg Oral TID AC  . pantoprazole  40 mg Oral Daily  . rifaximin  550 mg Oral BID   Continuous Infusions: . sodium chloride 75 mL/hr at 05/27/16 1600     LOS: 2 days    Time spent: 40 minutes    WOODS, Geraldo Docker, MD [facility] Pager [phone]   If 7PM-7AM, please contact night-coverage [email or web address] Password [email or web address] 05/27/2016, 8:05 PM

## 2016-05-28 ENCOUNTER — Inpatient Hospital Stay (HOSPITAL_COMMUNITY): Payer: Medicaid Other

## 2016-05-28 DIAGNOSIS — E872 Acidosis, unspecified: Secondary | ICD-10-CM | POA: Insufficient documentation

## 2016-05-28 DIAGNOSIS — N179 Acute kidney failure, unspecified: Secondary | ICD-10-CM | POA: Insufficient documentation

## 2016-05-28 DIAGNOSIS — J9601 Acute respiratory failure with hypoxia: Secondary | ICD-10-CM | POA: Insufficient documentation

## 2016-05-28 DIAGNOSIS — K7581 Nonalcoholic steatohepatitis (NASH): Secondary | ICD-10-CM

## 2016-05-28 DIAGNOSIS — D6959 Other secondary thrombocytopenia: Secondary | ICD-10-CM

## 2016-05-28 DIAGNOSIS — R040 Epistaxis: Secondary | ICD-10-CM

## 2016-05-28 DIAGNOSIS — R579 Shock, unspecified: Secondary | ICD-10-CM

## 2016-05-28 LAB — BLOOD GAS, ARTERIAL
ACID-BASE DEFICIT: 12.3 mmol/L — AB (ref 0.0–2.0)
Bicarbonate: 13.2 mEq/L — ABNORMAL LOW (ref 20.0–24.0)
DRAWN BY: 44166
FIO2: 1
MECHVT: 0.58 mL
O2 SAT: 99.8 %
PATIENT TEMPERATURE: 97.6
PCO2 ART: 28.9 mmHg — AB (ref 35.0–45.0)
PEEP: 10 cmH2O
PH ART: 7.28 — AB (ref 7.350–7.450)
PO2 ART: 357 mmHg — AB (ref 80.0–100.0)
RATE: 16 resp/min
TCO2: 14.2 mmol/L (ref 0–100)

## 2016-05-28 LAB — PREPARE RBC (CROSSMATCH)

## 2016-05-28 MED ORDER — BACITRACIN-NEOMYCIN-POLYMYXIN OINTMENT TUBE
1.0000 "application " | TOPICAL_OINTMENT | Freq: Once | CUTANEOUS | Status: DC | PRN
  Filled 2016-05-28: qty 15

## 2016-05-28 MED ORDER — ANTISEPTIC ORAL RINSE SOLUTION (CORINZ): 7.0000 mL | Freq: Four times a day (QID) | OROMUCOSAL | Status: DC

## 2016-05-28 MED ORDER — SODIUM BICARBONATE 8.4 % IV SOLN
INTRAVENOUS | Status: AC
Start: 2016-05-28 — End: 2016-05-28
  Administered 2016-05-28: 05:00:00
  Filled 2016-05-28: qty 50

## 2016-05-28 MED ORDER — SODIUM CHLORIDE 0.9 % IV SOLN: Freq: Once | INTRAVENOUS | Status: DC

## 2016-05-28 MED ORDER — LACTULOSE 10 GM/15ML PO SOLN: 10.0000 g | Freq: Three times a day (TID) | ORAL | Status: DC

## 2016-05-28 MED ORDER — SILVER NITRATE-POT NITRATE 75-25 % EX MISC
10.0000 | Freq: Once | CUTANEOUS | Status: DC | PRN
  Filled 2016-05-28: qty 10

## 2016-05-28 MED ORDER — LIDOCAINE HCL 4 % EX SOLN
0.0000 mL | Freq: Once | CUTANEOUS | Status: DC | PRN
  Filled 2016-05-28: qty 50

## 2016-05-28 MED ORDER — DEXTROSE 5 % IV SOLN
2.0000 ug/min | INTRAVENOUS | Status: DC
  Filled 2016-05-28 (×2): qty 4

## 2016-05-28 MED ORDER — CHLORHEXIDINE GLUCONATE 0.12% ORAL RINSE (MEDLINE KIT): 15.0000 mL | Freq: Two times a day (BID) | OROMUCOSAL | Status: DC

## 2016-05-28 MED ORDER — FENTANYL CITRATE (PF) 100 MCG/2ML IJ SOLN: 100.0000 ug | INTRAMUSCULAR | Status: DC | PRN

## 2016-05-28 MED ORDER — MIDAZOLAM HCL 2 MG/2ML IJ SOLN: 2.0000 mg | INTRAMUSCULAR | Status: DC | PRN

## 2016-05-28 MED ORDER — FAMOTIDINE IN NACL 20-0.9 MG/50ML-% IV SOLN: 20.0000 mg | Freq: Two times a day (BID) | INTRAVENOUS | Status: DC

## 2016-05-28 MED ORDER — LIDOCAINE HCL 2 % EX GEL
1.0000 "application " | Freq: Once | CUTANEOUS | Status: DC | PRN
  Filled 2016-05-28: qty 5

## 2016-05-28 MED ORDER — DESMOPRESSIN ACETATE 4 MCG/ML IJ SOLN
20.0000 ug | Freq: Once | INTRAMUSCULAR | Status: DC
  Filled 2016-05-28: qty 5

## 2016-05-28 MED ORDER — MIDAZOLAM HCL 2 MG/2ML IJ SOLN
2.0000 mg | INTRAMUSCULAR | Status: DC | PRN
  Filled 2016-05-28: qty 2

## 2016-05-28 MED ORDER — RIFAXIMIN 550 MG PO TABS
550.0000 mg | ORAL_TABLET | Freq: Two times a day (BID) | ORAL | Status: DC
  Filled 2016-05-28: qty 1

## 2016-05-28 MED ORDER — LIDOCAINE-EPINEPHRINE (PF) 1 %-1:200000 IJ SOLN
0.0000 mL | Freq: Once | INTRAMUSCULAR | Status: DC | PRN
  Filled 2016-05-28: qty 30

## 2016-05-28 MED ORDER — OXYMETAZOLINE HCL 0.05 % NA SOLN
1.0000 | Freq: Once | NASAL | Status: DC | PRN
  Filled 2016-05-28: qty 15

## 2016-05-28 MED ORDER — INSULIN ASPART 100 UNIT/ML ~~LOC~~ SOLN: 0.0000 [IU] | SUBCUTANEOUS | Status: DC

## 2016-05-28 MED ORDER — VITAMIN K1 10 MG/ML IJ SOLN
10.0000 mg | Freq: Once | INTRAVENOUS | Status: AC
  Administered 2016-05-28: 10 mg via INTRAVENOUS
  Filled 2016-05-28: qty 1

## 2016-05-28 MED ORDER — SODIUM BICARBONATE 8.4 % IV SOLN
INTRAVENOUS | Status: AC
  Administered 2016-05-28: 05:00:00
  Filled 2016-05-28: qty 50

## 2016-05-28 MED ORDER — SODIUM CHLORIDE 0.9 % IV SOLN
0.3000 ug/kg | Freq: Once | INTRAVENOUS | Status: AC
  Administered 2016-05-28: 35.6 ug via INTRAVENOUS
  Filled 2016-05-28: qty 8.9

## 2016-05-28 MED ORDER — COAGULATION FACTOR VIIA RECOMB 1 MG IV SOLR
90.0000 ug/kg | Freq: Once | INTRAVENOUS | Status: AC
  Administered 2016-05-28: 11000 ug via INTRAVENOUS
  Filled 2016-05-28: qty 11

## 2016-05-28 MED ORDER — EPINEPHRINE HCL 0.1 MG/ML IJ SOSY
PREFILLED_SYRINGE | INTRAMUSCULAR | Status: AC
  Administered 2016-05-28: 06:00:00
  Filled 2016-05-28: qty 50

## 2016-05-28 MED ORDER — EPINEPHRINE HCL 1 MG/ML IJ SOLN
0.5000 ug/min | INTRAMUSCULAR | Status: DC
  Administered 2016-05-28: 20 ug/min via INTRAVENOUS
  Filled 2016-05-28 (×2): qty 4

## 2016-05-28 MED ORDER — VITAMIN K1 10 MG/ML IJ SOLN
10.0000 mg | Freq: Every day | INTRAVENOUS | Status: DC
  Filled 2016-05-28: qty 1

## 2016-05-28 MED FILL — Medication: Qty: 1 | Status: AC

## 2016-05-29 LAB — TYPE AND SCREEN
ABO/RH(D): O POS
Antibody Screen: NEGATIVE
UNIT DIVISION: 0
UNIT DIVISION: 0
UNIT DIVISION: 0
UNIT DIVISION: 0
UNIT DIVISION: 0
Unit division: 0
Unit division: 0
Unit division: 0
Unit division: 0
Unit division: 0

## 2016-05-29 LAB — PREPARE FRESH FROZEN PLASMA
UNIT DIVISION: 0
UNIT DIVISION: 0
UNIT DIVISION: 0
Unit division: 0
Unit division: 0

## 2016-05-29 LAB — PREPARE PLATELET PHERESIS
UNIT DIVISION: 0
Unit division: 0

## 2016-06-03 ENCOUNTER — Telehealth: Payer: Self-pay

## 2016-06-03 NOTE — Telephone Encounter (Signed)
On 07-01-2016 I received a death certificate from [place] (original). The death certificate is for cremation. The patient is a patient of [facility] [employer]. The death certificate will be taken to Pulmonary Unit @ [facility] this am for signature. On Jul 01, 2016 I received the death certificate back from [place]. I got the death certificate ready and called the funeral home to let them know the death certificate is ready for pickup. I also faxed a copy to the funeral home per their request.

## 2016-06-24 NOTE — Consult Note (Signed)
  I was called by Pulm/CCM because of sudden onset of severe epistaxis. He had been intubated emergently and transferred to [facility] for continued care. According to the nursing staff, he had bled approximately 3-4 units of blood rapidly before any care could be initiated. After intubation and transfer, I arrived at [facility] and at that time he was no longer bleeding. He was receiving PRBC tranfusion. A central line was placed for IV access. Bleeding was intermittent following that. HGB was 7.2 prior to this recent bleeding episode. He remained hemodynamically unstable during the following 10-15 minutes while the Pum/CCm team was attempting to stabilize him. I was able to place a vented Merocel into the left nasal cavity. The right side had already been packed prior to transfer to [facility] and i was unable to pass anything into the right side. The patient then suffered cardiac arrest. ACLS was then administered but patient did not survive.

## 2016-06-24 NOTE — Progress Notes (Signed)
Pt was manually ventilated on 100% FiO2 throughout the lengthy code. Pt was massive bleeding throughout his nose and mouth throughout CPR, MD aware.

## 2016-06-24 NOTE — Significant Event (Addendum)
Rapid Response Event Note  Overview: Time Called: 0309 Arrival Time: 0311 Event Type: Other (Comment)  Initial Focused Assessment:  Called to help with patient who was experiencing severe bleeding from nose and mouth.  Kyle [name] was admitted to the hospital for nosebleed.  Patient has a PMHx of diabetes mellitus, liver cirrhosis, bladder cancer, CKD stage III, thrombocytopenia, esophageal varices, diastolic heart failure who came to the hospital with nosebleed. Upon admission in the ED patient was found to be hypotensive, with elevated lactate.    Interventions: Upon arrival patient was PROFUSELY BLEEDING from his mouth and nose.  Bedside RN was holding pressure but bleeding could not be stopped, I switched places with her and held pressure over that patient's nose while trying keep his airway open.  Patient was in the chair, patient was carefully placed in the bed while pressure was being held.  IV FLUIDS were infusing wide open until blood products were started. After being in the bed for less than one minute, patient arrested and was in PEA at 324. NP for CCM was already at the bedside, chest compressions started, emergency blood ordered and was started. ACLS in progress.  Patient was coded for 17 minutes, epi x 2, bicarb x 1. Achieved ROSC at 341.  Patient was immediately intubated by PCCM NP at the beginning of the code, patient was bagged via RT.  Patient tx to 2H07.  Patient's family was called by several providers, no response back.  Plan of Care (if not transferred): tx to 2H07  Event Summary: Name of Physician Notified: Dr. Alva Garnet PCCM at 0325    at    Outcome: Transferred (Comment), Coded and survived  Event End Time: [place], [place]

## 2016-06-24 NOTE — Discharge Summary (Signed)
PULMONARY / CRITICAL CARE MEDICINE   Name: Kyle [name] MRN: [id number] DOB: November 12, 1956    ADMISSION DATE:  05/30/2016 CONSULTATION DATE:  7/5  REFERRING MD: Dr. 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 Underwent TIPS in [id number] complicated by hepatic encephalopathy. He was admitted to [facility] 7/2 for epistaxis causing hypotension and elevated lactic acid. Epistaxis spontaneously resolved en route to ED. He was admitted to hospitalists. He was treated for anemia and coagulopathy with blood products. He had been improving as expected until 28 early AM when he developed severe epistaxis which compromised his airway causing a cardiac arrest. He was emergently intubated and underwent ~7 mins ACLS. Initial rhythm asystole. See code sheet for official details. He was transferred to ICU, and epistaxis continued.   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 pylori infection (12/24/2012); Sleep apnea; Sleep apnea; Cellulitis  (04/2014); Iron deficiency anemia secondary to blood loss (chronic) (03/09/2009); Bladder cancer ([place]); Chronic diastolic CHF (congestive heart failure) ([place]) (03/21/2016); CKD (chronic kidney disease), stage III (03/21/2016); and DKA, type 2 ([place]) (03/19/2016).  PAST SURGICAL HISTORY: He  has past surgical history that includes carpel tunnel (Right); Shoulder surgery (Left); adrenal mass surgery (05/2009); Bladder surgery (01/2009 and 06/2010); Colonoscopy (04/2009); Esophagogastroduodenoscopy (03/2009); small bowel capsule endoscopy (03/2009); EGD/TCS (08/2007); Skin cancer excision (Oct 2012); Esophagogastroduodenoscopy (11/25/2011); Esophagogastroduodenoscopy (01/14/2012); Esophagogastroduodenoscopy (03/17/2012); Umbilical hernia repair (04/05/2012); Esophagogastroduodenoscopy (04/11/2012); Esophagogastroduodenoscopy (05/24/2012); Esophagogastroduodenoscopy (04/11/12); Colonoscopy (09/29/2012); Esophagogastroduodenoscopy (12/24/2012); Schlerotherapy (12/24/2012); esophageal banding (12/24/2012); TIPS procedure (12/2012); Cataract extraction, bilateral; Hernia repair; Eye surgery; Transurethral resection of bladder tumor (N/A, 03/12/2015); Cystoscopy w/ ureteral stent placement (Bilateral, 03/12/2015); and Circumcision (N/A, 03/12/2015).  Allergies  Allergen Reactions  . Aspirin Other (See Comments)    Harms liver.   . Ibuprofen Other (See Comments)    Can't take per Dr Gala Romney- harms livers  . Tylenol [Acetaminophen] Other (See Comments)    Restless Legs     No current facility-administered medications on file prior to encounter.   Current Outpatient Prescriptions on File Prior to Encounter  Medication Sig  . ACCU-CHEK AVIVA PLUS test strip USE TEST STRIPS TO CHECK BLOOD SUGAR THREE TIMES DAILY AS DIRECTED  . furosemide (LASIX) 40 MG tablet Take 2 tablets (80 mg total) by mouth daily.  . Insulin Glargine (TOUJEO SOLOSTAR) 300 UNIT/ML SOPN Inject 25 Units into the skin 2 (two) times daily.  . Insulin Pen Needle 29G X  12.7MM MISC Use with Toujeo solostar  . insulin regular human CONCENTRATED (HUMULIN R) 500 UNIT/ML injection Inject 0.6 mLs (300 Units total) into the skin 3 (three) times daily. (Patient taking differently: Inject into the skin 3 (three) times daily. )  . Insulin Syringes, Disposable, (B-D INSULIN SYRINGE 1CC) U-100 1 ML MISC Inject 60 Units into the  skin 3 (three) times daily.  [name] lactulose (CHRONULAC) 10 GM/15ML solution Take 45 mLs (30 g total) by mouth 3 (three) times daily.  . metoCLOPramide (REGLAN) 10 MG tablet Take 1 tablet (10 mg total) by mouth 3 (three) times daily before meals.  [name] omeprazole (PRILOSEC) 40 MG capsule TAKE ONE CAPSULE BY MOUTH ONCE DAILY  . oxyCODONE (ROXICODONE) 5 MG immediate release tablet Take 1 tablet (5 mg total) by mouth every 4 (four) hours as needed for severe pain.  [name] propranolol (INDERAL) 20 MG tablet TAKE ONE TABLET BY MOUTH TWICE DAILY  . spironolactone (ALDACTONE) 50 MG tablet Take 50 mg by mouth 2 (two) times daily.   [name] XIFAXAN 550 MG TABS tablet TAKE ONE TABLET BY MOUTH TWICE DAILY    FAMILY HISTORY:  His has no family status information on file.   SOCIAL HISTORY: He  reports that he has been smoking Cigarettes.  He has a 30 pack-year smoking history. He has never used smokeless tobacco. He reports that he does not drink alcohol or use illicit drugs.  REVIEW OF SYSTEMS:   unable  SUBJECTIVE:    VITAL SIGNS: BP 122/71 mmHg  Pulse 87  Temp(Src) 97.6 F (36.4 C) (Oral)  Resp 27  Ht 5\' 7"  (1.702 m)  Wt 119.1 kg (262 lb 9.1 oz)  BMI 41.11 kg/m2  SpO2 92%  HEMODYNAMICS:    VENTILATOR SETTINGS: Vent Mode:  [-] PRVC FiO2 (%):  [100 %] 100 % Set Rate:  [16 bmp] 16 bmp Vt Set:  [580 mL] 580 mL PEEP:  [10 cmH20] 10 cmH20  INTAKE / OUTPUT: I/O last 3 completed shifts: In: 2613 [P.O.:480; I.V.:1150; Blood:983] Out: 1250 [Urine:1250]  PHYSICAL EXAMINATION: General:  Pt was seen moribund. Pale.  Neuro:  Non focal. Non reactive pupils. Pt  just came out of a code HEENT:  Pupils 3 mm non reactive. (+) blood coming out of nares and per orem.  Cardiovascular:  Soft. (-) m appreciated Lungs:  Fair ae. Dec BS Abdomen:  Dec BS. Protuberant abd. (-) masses.  Musculoskeletal:  Gr 2 edema. Pale. cyanotic Skin:  Cool extremities  LABS:  BMET  Recent Labs Lab 05/25/16 1203 05/25/16 1942 05/27/16 0423  NA 133* 134* 137  K 6.2* 5.1 3.9  CL 109 106 110  CO2 15* 17* 22  BUN 20 23* 15  CREATININE 1.41* 1.50* 1.29*  GLUCOSE 300* 276* 115*    Electrolytes  Recent Labs Lab 05/25/16 1203 05/25/16 1942 05/27/16 0423  CALCIUM 7.7* 8.2* 8.6*    CBC  Recent Labs Lab 05/26/16 1307 05/26/16 1610 05/27/16 0423 05/27/16 1536  WBC 7.5 7.5 5.4  --   HGB 8.2* 8.3* 7.2* 8.2*  HCT 24.8* 24.7* 21.4* 24.2*  PLT 52* 54* 45*  --     Coag's  Recent Labs Lab 05/26/16 1307 05/27/16 0423 05/27/16 1536  APTT 35  --   --   INR 1.84* 1.74* 1.67*    Sepsis Markers  Recent Labs Lab 05/31/2016 2110 05/25/16 0010  LATICACIDVEN 3.62* 3.60*    ABG  Recent Labs Lab May 30, 2016 0455  PHART 7.280*  PCO2ART 28.9*  PO2ART 357*    Liver Enzymes  Recent Labs Lab 05/27/2016 2103 05/27/16 0423  AST 29 80*  ALT 21 56  ALKPHOS 100 52  BILITOT 3.7* 5.3*  ALBUMIN 1.9* 2.2*    Cardiac Enzymes No results for input(s): TROPONINI, PROBNP in the last 168 hours.  Glucose  Recent Labs Lab 05/26/16 1602 05/26/16 2205  05/27/16 0731 05/27/16 1200 05/27/16 1618 05/27/16 2155  GLUCAP 201* 146* 98 327* 137* 119*    Imaging Portable Chest Xray  2016-06-25  CLINICAL DATA:  Endotracheal tube, central line placement. EXAM: PORTABLE CHEST 1 VIEW COMPARISON:  Chest radiograph May 15, 2016 FINDINGS: Endotracheal tube tip projects 3.9 cm above the carina. LEFT internal jugular central venous catheter tip projects at brachiocephalic confluence. Patient is rotated to the RIGHT. Lateral RIGHT chest is out of field of  view.Cardiomediastinal silhouette is unremarkable for this low inspiratory portable examination with crowded vasculature markings. Mild pulmonary vascular congestion. The lungs are clear without pleural effusions or focal consolidations. Trachea projects midline and there is no pneumothorax. Included soft tissue planes and osseous structures are non-suspicious. Multiple pacer pads and wires overlie the patient. IMPRESSION: Endotracheal tube tip projects 3.9 cm above the carina. LEFT internal jugular central venous catheter tip projects at brachiocephalic confluence. Mild pulmonary vascular congestion for this low inspiratory portable examination. Electronically Signed   By: Elon Alas M.D.   On: 06-25-2016 04:41     STUDIES:  Reviewed.  CULTURES: Reviewed.  ANTIBIOTICS: Reviewed.   SIGNIFICANT EVENTS:   LINES/TUBES:   DISCUSSION: Patient has complicated medical history including liver cirrhosis, history of bleeding esophageal varices, esophageal ulcer with hemorrhage, bladder cancer, chronic kidney disease stage III. He was admitted because of Epistaxis  which subsequently resolved. He was given blood products. During early morning of July 5, he had profuse epistaxis compromising airway. He initially arrested in the floors. Total arrest time was 7 minutes. He was transferred to the ICU.    ASSESSMENT / PLAN:   A: Acute hypoxemic respiratory failure secondary to inability to protect airway. Hemorrhagic shock secondary to most likely :  Bleeding esophageal varices  Bleeding esophageal ulcer.  Profuse epistaxis.  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 He was moribund on transfer. Agonal breathing.  Patient was intubated during the arrest. By the time I saw the pt, he was not  bleeding in the ET tube.   He was bleeding per nares and orem. ENT was trying to protect the nares by inserting a catheter. Continue ventilatory support. He is in 100% FiO2. Sats were barely readable. He was on levophed drip. We  Increased  it from 20 to 40 mcg/kg/m. Epinephrine drip was also started at 20 mcg/kg/m. Throughout his ICU stay, we were constantly giving him blood products. We ended up giving him 5 units packed red blood cells, 4 units fresh frozen plasma, 2 packs of platelets. We also gave him 35 g of DDAVP IV.   Patient had another cardiac arrest starting at 5:02 am. He was asystole and PEA throughout the code. We ended up giving 9 amps of epinephrine, 5 amps of bicarbonate during the code. We also ended up giving NovoSeven as a last resort to control bleeding.   During the code, he continued to have bleeding per orem and nares. Not much blood from the ET tube. We tried to get hold of a [place] or a [place] tube to no avail. We called emergency room and OR and they did not have any. We decided to insert a [place] 14 Foley catheter to act like a tamponade at the esophagus. Patient was too unstable to go to the operating room and he was still bleeding by the time we inserted the Foley catheter. Towards  the end of the code, there was minimal bleeding noted per orem and nares. At this time however, patient was anoxic and he remained cool and clammy and his pupils were fixed and dilated. We decided to terminate CPR after doing 44 minutes of chest compression.  From the moment patient was transferred to the ICU, the team has been constantly calling the wife and leaving voice mails. Unfortunately, we were not able to contact the wife during the code.  Total code time was 51 minutes.   I spent a total of 110 minutes of critical care time with this patient today.  Patient was pronounced dead at 28: 56 am.   Monica Becton, MD 06-27-2016, 3:01 PM [facility] Pager [phone] After 3 pm or if no answer, call [phone]

## 2016-06-24 NOTE — Progress Notes (Signed)
Patient arrived from [facility] post code/intubation. CCM NP at bedside. 1u PRBC and plts infusing on arrival. During central line placement the patient started bleeding profusely again from mouth and nares and became hypotension. Levophed started. MD notified ENT. BP contiuned to decrease, additional blood products given emergently. Patient SBP to 30s and lost pulse at 0502, ACLS started (see code sheet). Before and during code, attempted to contact wife, left multiple messages for urgent call back. Patient continued to bleed profusely from mouth and nares at least 4L suctioned, not counting blood on bed. SEE MD note for ACLS meds. Code called by MD at 0546. Never able to contact wife and update her. Melene Plan RN Idelia Salm, RN

## 2016-06-24 NOTE — Progress Notes (Signed)
RN paged this NP about pt having nose bleed. NP in another emergency. When NP called back, rapid response, critical care were involved and pt was being coded. Upon arrival, compressions in progress and pt intubated by PCCM. See code report for medications and length of CPR. Pt urgently tranferred to ICU under the care of PCCM. ENT, Dr. Constance Holster, came to room as well.  This NP attempted to call pt's wife x 3 without answer.  KJKG, NP [facility]

## 2016-06-24 NOTE — Significant Event (Addendum)
Rapid Response Event Note  Overview: Time Called: 0502 Arrival Time: 0505 Event Type: Respiratory, Cardiac, Other (Comment)  Initial Focused Assessment: CODE BLUE.  Interventions:  ACLS, transfusions of blood products and DDAVP, VITAMIN K, and Novo-Seven, patient was coded for 44 minutes, all rescue measures exhausted.  Per MD, TOD 546.  Several attempts made by staff members to notify the family of the patient's condition, no response from the family.  Plan of Care (if not transferred): none, patient expired at 546  Event Summary: Name of Physician Notified: Dr. Alva Garnet PCCM at 502    at    Outcome: Coded and expired  Event End Time: [place], [place]

## 2016-06-24 NOTE — Progress Notes (Signed)
   06-01-2016 1300  Clinical Encounter Type  Visited With Family  Visit Type Death  Referral From Nurse  Spiritual Encounters  Spiritual Needs Grief support  Stress Factors  Family Stress Factors Loss;Lack of knowledge  Chaplain received page that chaplain was needed STAT on 2C to meet [facility]. When chaplain arrived was told wife of deceased patient would be arriving soon; she did not know spouse had died. AC, doctor, and I would meet with her. When she arrived, she saw my nametag and immediately began to sob. We spent 10-15 minutes in an office waiting for someone to join [place] and tell her what was going on before doctor and [facility] came in. Wife had already called family to tell them patient had died. Walked spouse to [place] and then met up with other family and accompanied them to morgue. Liba Hulsey, Chaplain

## 2016-06-24 NOTE — Progress Notes (Signed)
Pt called RN in to room for nosebleed.  On assessment, pt nose was bleeding while pt up to chair, RN was unable to stop the bleeding. TRH notified. Bleeding increased rapidly and charge RN came to assist. E link notified and rapid response called. Pt was deteriorating quickly as bleeding unable to be stopped by applying pressure and with current balloon in place in right nare. Team members assisted pt to bed with HOB elevated.  PCCM arrived to room, pt lost pulse and CPR started. CPR/ACLS for several minutes, pt intubated, pulse returned and pt transferred to [facility]. During code, pt emergently received 1 unit FFP (unit # [id number]) and 1 unit PRBCs (unit [id number]).

## 2016-06-24 NOTE — Progress Notes (Signed)
eLink Physician-Brief Progress Note Patient Name: Kyle [name] DOB: 01-26-1956 MRN: [id number]   Date of Service  05/29/16  HPI/Events of Note  Emergent call from bedside RN for massive epistaxis  eICU Interventions  I ordered platelets, FFP and RBCs I have called ENT and anesthesia for emergent airway/intervention   Pt subsequently progressed to asystole - CPR in progress        Merton Border 05-29-16, 3:25 AM

## 2016-06-24 NOTE — Procedures (Signed)
Arterial Catheter Insertion Procedure Note Kyle [name] [id number] 28-Nov-1955  Procedure: Insertion of Arterial Catheter  Indications: Blood pressure monitoring and Frequent blood sampling  Procedure Details Consent: Unable to obtain consent because of emergent medical necessity. Time Out: Verified patient identification, verified procedure, site/side was marked, verified correct patient position, special equipment/implants available, medications/allergies/relevent history reviewed, required imaging and test results available.  Performed  Maximum sterile technique was used including cap, gloves, hand hygiene and mask. Skin prep: Chlorhexidine; local anesthetic administered 20 gauge catheter was inserted into right radial artery  using the Seldinger technique.  Evaluation Blood flow good; BP tracing good. Complications: No apparent complications.  Right Radial Arterial Line in place. Emergently Necessary. [name] NP verbal order. A-line has good draw back and flushes well. No apparent complications noted throughout procedure.  Leigh Aurora, BS, RCP, RRT 06/07/2016

## 2016-06-24 NOTE — Progress Notes (Signed)
Received patient from [place], massive bleeding from the nose and mouth area. RN/NP at bedside.

## 2016-06-24 NOTE — Progress Notes (Signed)
ABG results verbally given to NP.

## 2016-06-24 NOTE — Consult Note (Signed)
PULMONARY / CRITICAL CARE MEDICINE   Name: Kyle [name] MRN: [id number] DOB: November 12, 1956    ADMISSION DATE:  06/21/2016 CONSULTATION DATE:  7/5  REFERRING MD: Dr. 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 Underwent TIPS in [id number] complicated by hepatic encephalopathy. He was admitted to [facility] 7/2 for epistaxis causing hypotension and elevated lactic acid. Epistaxis spontaneously resolved en route to ED. He was admitted to hospitalists. He was treated for anemia and coagulopathy with blood products. He had been improving as expected until 28 early AM when he developed severe epistaxis which compromised his airway causing a cardiac arrest. He was emergently intubated and underwent ~7 mins ACLS. Initial rhythm asystole. See code sheet for official details. He was transferred to ICU, and epistaxis continued.   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 pylori infection (12/24/2012); Sleep apnea; Sleep apnea; Cellulitis  (04/2014); Iron deficiency anemia secondary to blood loss (chronic) (03/09/2009); Bladder cancer ([place]); Chronic diastolic CHF (congestive heart failure) ([place]) (03/21/2016); CKD (chronic kidney disease), stage III (03/21/2016); and DKA, type 2 ([place]) (03/19/2016).  PAST SURGICAL HISTORY: He  has past surgical history that includes carpel tunnel (Right); Shoulder surgery (Left); adrenal mass surgery (05/2009); Bladder surgery (01/2009 and 06/2010); Colonoscopy (04/2009); Esophagogastroduodenoscopy (03/2009); small bowel capsule endoscopy (03/2009); EGD/TCS (08/2007); Skin cancer excision (Oct 2012); Esophagogastroduodenoscopy (11/25/2011); Esophagogastroduodenoscopy (01/14/2012); Esophagogastroduodenoscopy (03/17/2012); Umbilical hernia repair (04/05/2012); Esophagogastroduodenoscopy (04/11/2012); Esophagogastroduodenoscopy (05/24/2012); Esophagogastroduodenoscopy (04/11/12); Colonoscopy (09/29/2012); Esophagogastroduodenoscopy (12/24/2012); Schlerotherapy (12/24/2012); esophageal banding (12/24/2012); TIPS procedure (12/2012); Cataract extraction, bilateral; Hernia repair; Eye surgery; Transurethral resection of bladder tumor (N/A, 03/12/2015); Cystoscopy w/ ureteral stent placement (Bilateral, 03/12/2015); and Circumcision (N/A, 03/12/2015).  Allergies  Allergen Reactions  . Aspirin Other (See Comments)    Harms liver.   . Ibuprofen Other (See Comments)    Can't take per Dr Gala Romney- harms livers  . Tylenol [Acetaminophen] Other (See Comments)    Restless Legs     No current facility-administered medications on file prior to encounter.   Current Outpatient Prescriptions on File Prior to Encounter  Medication Sig  . ACCU-CHEK AVIVA PLUS test strip USE TEST STRIPS TO CHECK BLOOD SUGAR THREE TIMES DAILY AS DIRECTED  . furosemide (LASIX) 40 MG tablet Take 2 tablets (80 mg total) by mouth daily.  . Insulin Glargine (TOUJEO SOLOSTAR) 300 UNIT/ML SOPN Inject 25 Units into the skin 2 (two) times daily.  . Insulin Pen Needle 29G X  12.7MM MISC Use with Toujeo solostar  . insulin regular human CONCENTRATED (HUMULIN R) 500 UNIT/ML injection Inject 0.6 mLs (300 Units total) into the skin 3 (three) times daily. (Patient taking differently: Inject into the skin 3 (three) times daily. )  . Insulin Syringes, Disposable, (B-D INSULIN SYRINGE 1CC) U-100 1 ML MISC Inject 60 Units into the  skin 3 (three) times daily.  [name] lactulose (CHRONULAC) 10 GM/15ML solution Take 45 mLs (30 g total) by mouth 3 (three) times daily.  . metoCLOPramide (REGLAN) 10 MG tablet Take 1 tablet (10 mg total) by mouth 3 (three) times daily before meals.  [name] omeprazole (PRILOSEC) 40 MG capsule TAKE ONE CAPSULE BY MOUTH ONCE DAILY  . oxyCODONE (ROXICODONE) 5 MG immediate release tablet Take 1 tablet (5 mg total) by mouth every 4 (four) hours as needed for severe pain.  [name] propranolol (INDERAL) 20 MG tablet TAKE ONE TABLET BY MOUTH TWICE DAILY  . spironolactone (ALDACTONE) 50 MG tablet Take 50 mg by mouth 2 (two) times daily.   [name] XIFAXAN 550 MG TABS tablet TAKE ONE TABLET BY MOUTH TWICE DAILY    FAMILY HISTORY:  His has no family status information on file.   SOCIAL HISTORY: He  reports that he has been smoking Cigarettes.  He has a 30 pack-year smoking history. He has never used smokeless tobacco. He reports that he does not drink alcohol or use illicit drugs.  REVIEW OF SYSTEMS:   unable  SUBJECTIVE:    VITAL SIGNS: BP 122/71 mmHg  Pulse 87  Temp(Src) 97.6 F (36.4 C) (Oral)  Resp 27  Ht 5\' 7"  (1.702 m)  Wt 119.1 kg (262 lb 9.1 oz)  BMI 41.11 kg/m2  SpO2 92%  HEMODYNAMICS:    VENTILATOR SETTINGS: Vent Mode:  [-] PRVC FiO2 (%):  [100 %] 100 % Set Rate:  [16 bmp] 16 bmp Vt Set:  [580 mL] 580 mL PEEP:  [10 cmH20] 10 cmH20  INTAKE / OUTPUT: I/O last 3 completed shifts: In: 4467.5 [P.O.:1560; I.V.:1652.5; Blood:1255] Out: 1250 [Urine:1250]  PHYSICAL EXAMINATION: General:  Pt was seen moribund. Pale.  Neuro:  Non focal. Non reactive  pupils. Pt just came out of a code HEENT:  Pupils 3 mm non reactive. (+) blood coming out of nares and per orem.  Cardiovascular:  Soft. (-) m appreciated Lungs:  Fair ae. Dec BS Abdomen:  Dec BS. Protuberant abd. (-) masses.  Musculoskeletal:  Gr 2 edema. Pale. cyanotic Skin:  Cool extremities  LABS:  BMET  Recent Labs Lab 05/25/16 1203 05/25/16 1942 05/27/16 0423  NA 133* 134* 137  K 6.2* 5.1 3.9  CL 109 106 110  CO2 15* 17* 22  BUN 20 23* 15  CREATININE 1.41* 1.50* 1.29*  GLUCOSE 300* 276* 115*    Electrolytes  Recent Labs Lab 05/25/16 1203 05/25/16 1942 05/27/16 0423  CALCIUM 7.7* 8.2* 8.6*    CBC  Recent Labs Lab 05/26/16 1307 05/26/16 1610 05/27/16 0423 05/27/16 1536  WBC 7.5 7.5 5.4  --   HGB 8.2* 8.3* 7.2* 8.2*  HCT 24.8* 24.7* 21.4* 24.2*  PLT 52* 54* 45*  --     Coag's  Recent Labs Lab 05/26/16 1307 05/27/16 0423 05/27/16 1536  APTT 35  --   --   INR 1.84* 1.74* 1.67*    Sepsis Markers  Recent Labs Lab 06/13/2016 2110 05/25/16 0010  LATICACIDVEN 3.62* 3.60*    ABG No results for input(s): PHART, PCO2ART, PO2ART in the last 168 hours.  Liver Enzymes  Recent Labs Lab 06/04/2016 2103 05/27/16 0423  AST 29 80*  ALT 21 56  ALKPHOS 100 52  BILITOT 3.7* 5.3*  ALBUMIN 1.9* 2.2*    Cardiac Enzymes No results for input(s): TROPONINI, PROBNP in the last 168 hours.  Glucose  Recent Labs Lab 05/26/16 1602 05/26/16 2205 05/27/16 0731 05/27/16 1200 05/27/16  1618 05/27/16 2155  GLUCAP 201* 146* 98 327* 137* 119*    Imaging No results found.   STUDIES:  Reviewed.  CULTURES: Reviewed.  ANTIBIOTICS: Reviewed.   SIGNIFICANT EVENTS:   LINES/TUBES:   DISCUSSION: Patient has complicated medical history including liver cirrhosis, history of bleeding esophageal varices, esophageal ulcer with hemorrhage, bladder cancer, chronic kidney disease stage III. He was admitted because of Epistaxis  which subsequently  resolved. He was given blood products. During early morning of July 5, he had profuse epistaxis compromising airway. He initially arrested in the floors. Total arrest time was 7 minutes. He was transferred to the ICU.    ASSESSMENT / PLAN:   A: Acute hypoxemic respiratory failure secondary to inability to protect airway. Hemorrhagic shock secondary to most likely :  Bleeding esophageal varices  Bleeding esophageal ulcer.  Profuse epistaxis.  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 He was moribund on transfer. Agonal breathing.  Patient was intubated during the arrest. By the time I saw the pt, he was not bleeding in the ET tube.   He was bleeding per nares and orem. ENT was trying to protect the nares by inserting a catheter. Continue ventilatory support. He is in 100% FiO2. Sats were barely readable. He was on levophed drip. We  Increased  it from 20 to 40 mcg/kg/m. Epinephrine drip was also started at 20 mcg/kg/m. Throughout his ICU stay, we were constantly giving him blood products. We ended up giving him 5 units packed red blood cells, 4 units fresh frozen plasma, 2 packs of platelets. We also gave him 35 g of DDAVP IV.   Patient had another cardiac arrest starting at 5:02 am. He was asystole and PEA throughout the code. We ended up giving 9 amps of epinephrine, 5 amps of bicarbonate during the code. We also ended up giving NovoSeven as a last resort to control bleeding.   During the code, he continued to have bleeding per orem and nares. Not much blood from the ET tube. We tried to get hold of a [place] or a [place] tube to no avail. We called emergency room and OR and they did not have any. We decided to insert a [place] 14 Foley catheter to act like a tamponade at the esophagus.  Patient was too unstable to go to the operating room and he was still bleeding by the time we inserted the Foley catheter. Towards the end of the code, there was minimal bleeding noted per orem and nares. At this time however, patient was anoxic and he remained cool and clammy and his pupils were fixed and dilated. We decided to terminate CPR after doing 44 minutes of chest compression.  From the moment patient was transferred to the ICU, the team has been constantly calling the wife and leaving voice mails. Unfortunately, we were not able to contact the wife during the code.  Total code time was 51 minutes.   I spent a total of 110 minutes of critical care time with this patient today.  Monica Becton, MD 06/09/16, 6:18 AM [facility] Pager [phone] After 3 pm or if no answer, call [phone]

## 2016-06-24 DEATH — deceased

## 2016-06-30 ENCOUNTER — Ambulatory Visit: Payer: Medicaid Other | Admitting: "Endocrinology

## 2016-11-01 ENCOUNTER — Other Ambulatory Visit: Payer: Self-pay | Admitting: Nurse Practitioner

## 2017-07-27 IMAGING — CR DG CHEST 1V PORT
1 series · 1 of 1 positions shown · non-contrast
Comparison: Chest radiograph dated 12/27/2015

CLINICAL DATA: 60-year-old male with shortness of breath

EXAM:
PORTABLE CHEST 1 VIEW

[ap]
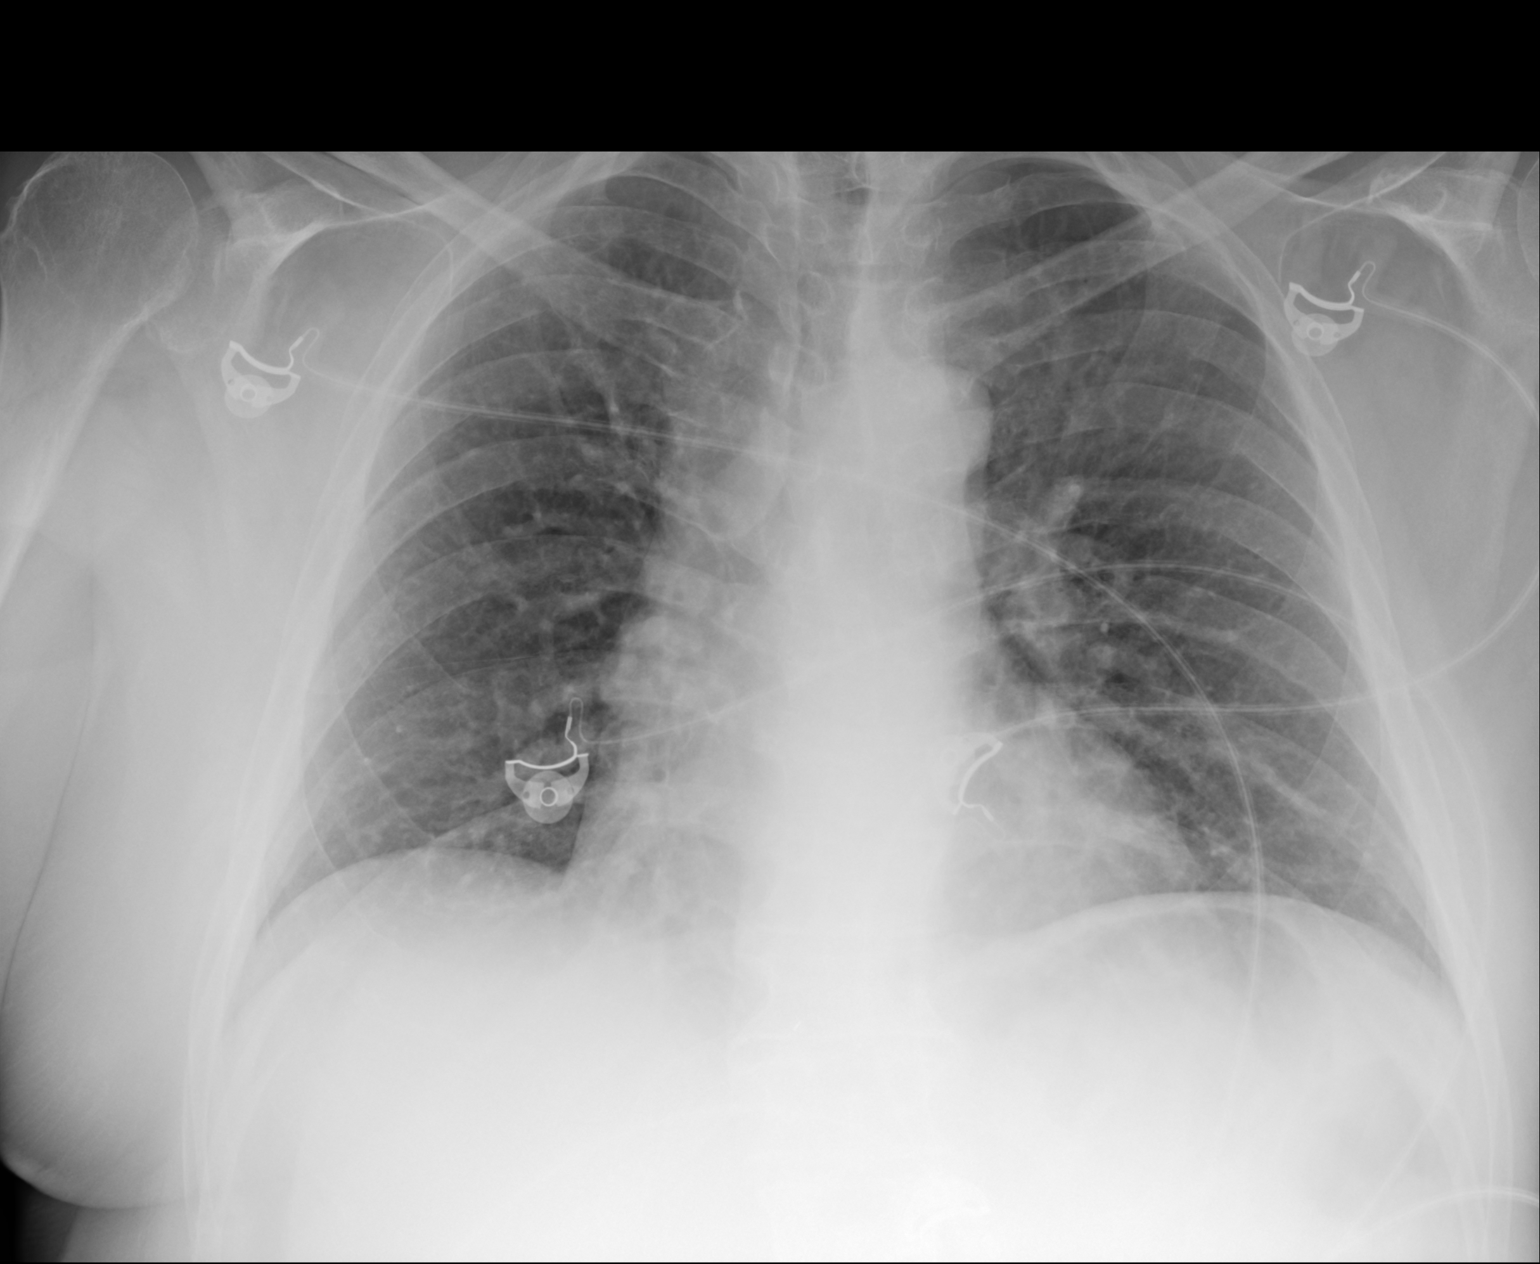

[1 of 1 positions shown; findings below may reference images not displayed]

FINDINGS: Single portable view of the chest does not demonstrate focal
consolidation. There is no pleural effusion or pneumothorax. The
cardiac silhouette is within normal limits. No acute osseous
pathology.
IMPRESSION: No active disease.
